# Patient Record
Sex: Female | Born: 1935
Health system: Southern US, Community
[De-identification: ages and names within clinical notes are randomized; demographics above are authoritative.]

## PROBLEM LIST (undated history)

## (undated) DIAGNOSIS — Z809 Family history of malignant neoplasm, unspecified: Secondary | ICD-10-CM

## (undated) DIAGNOSIS — Z87440 Personal history of urinary (tract) infections: Secondary | ICD-10-CM

## (undated) DIAGNOSIS — I10 Essential (primary) hypertension: Secondary | ICD-10-CM

## (undated) DIAGNOSIS — E785 Hyperlipidemia, unspecified: Secondary | ICD-10-CM

## (undated) DIAGNOSIS — M109 Gout, unspecified: Secondary | ICD-10-CM

## (undated) DIAGNOSIS — Z923 Personal history of irradiation: Secondary | ICD-10-CM

## (undated) DIAGNOSIS — Z853 Personal history of malignant neoplasm of breast: Secondary | ICD-10-CM

## (undated) DIAGNOSIS — T7840XA Allergy, unspecified, initial encounter: Secondary | ICD-10-CM

## (undated) DIAGNOSIS — I4891 Unspecified atrial fibrillation: Secondary | ICD-10-CM

## (undated) DIAGNOSIS — H9190 Unspecified hearing loss, unspecified ear: Secondary | ICD-10-CM

## (undated) DIAGNOSIS — C50919 Malignant neoplasm of unspecified site of unspecified female breast: Secondary | ICD-10-CM

## (undated) DIAGNOSIS — M8580 Other specified disorders of bone density and structure, unspecified site: Secondary | ICD-10-CM

## (undated) HISTORY — PX: CHOLECYSTECTOMY: SHX55

## (undated) HISTORY — PX: APPENDECTOMY: SHX54

## (undated) HISTORY — DX: Family history of malignant neoplasm, unspecified: Z80.9

## (undated) HISTORY — DX: Personal history of irradiation: Z92.3

## (undated) HISTORY — DX: Unspecified atrial fibrillation: I48.91

## (undated) HISTORY — PX: TONSILLECTOMY: SUR1361

## (undated) HISTORY — DX: Personal history of malignant neoplasm of breast: Z85.3

## (undated) HISTORY — PX: SEPTOPLASTY: SUR1290

## (undated) HISTORY — DX: Essential (primary) hypertension: I10

## (undated) HISTORY — DX: Hyperlipidemia, unspecified: E78.5

---

## 1989-01-02 HISTORY — PX: BREAST SURGERY: SHX581

## 1997-05-04 HISTORY — PX: ABDOMINAL HYSTERECTOMY: SHX81

## 1999-06-03 ENCOUNTER — Other Ambulatory Visit: Admission: RE | Admit: 1999-06-03 | Discharge: 1999-06-03 | Payer: Self-pay | Admitting: Internal Medicine

## 2004-03-10 ENCOUNTER — Ambulatory Visit: Payer: Self-pay | Admitting: Family Medicine

## 2004-03-13 ENCOUNTER — Ambulatory Visit: Payer: Self-pay | Admitting: Family Medicine

## 2004-03-19 ENCOUNTER — Ambulatory Visit: Payer: Self-pay | Admitting: Family Medicine

## 2004-06-19 ENCOUNTER — Ambulatory Visit: Payer: Self-pay | Admitting: Family Medicine

## 2004-07-17 ENCOUNTER — Ambulatory Visit: Payer: Self-pay | Admitting: Family Medicine

## 2004-10-10 ENCOUNTER — Ambulatory Visit: Payer: Self-pay | Admitting: Family Medicine

## 2004-10-14 ENCOUNTER — Ambulatory Visit: Payer: Self-pay | Admitting: Family Medicine

## 2004-12-15 ENCOUNTER — Ambulatory Visit: Payer: Self-pay | Admitting: Family Medicine

## 2004-12-18 ENCOUNTER — Ambulatory Visit: Payer: Self-pay | Admitting: Internal Medicine

## 2005-01-08 ENCOUNTER — Ambulatory Visit: Payer: Self-pay | Admitting: Family Medicine

## 2005-01-09 ENCOUNTER — Ambulatory Visit: Payer: Self-pay | Admitting: Internal Medicine

## 2005-01-09 ENCOUNTER — Encounter (INDEPENDENT_AMBULATORY_CARE_PROVIDER_SITE_OTHER): Payer: Self-pay | Admitting: *Deleted

## 2005-03-11 ENCOUNTER — Ambulatory Visit: Payer: Self-pay | Admitting: Family Medicine

## 2005-09-15 ENCOUNTER — Ambulatory Visit: Payer: Self-pay | Admitting: Family Medicine

## 2006-02-16 ENCOUNTER — Ambulatory Visit: Payer: Self-pay | Admitting: Family Medicine

## 2006-04-08 ENCOUNTER — Ambulatory Visit: Payer: Self-pay | Admitting: Family Medicine

## 2006-04-22 ENCOUNTER — Ambulatory Visit: Payer: Self-pay | Admitting: Family Medicine

## 2006-04-29 ENCOUNTER — Ambulatory Visit: Payer: Self-pay | Admitting: Family Medicine

## 2006-04-29 LAB — CONVERTED CEMR LAB: Potassium: 4.3 meq/L (ref 3.5–5.1)

## 2006-07-22 ENCOUNTER — Ambulatory Visit: Payer: Self-pay | Admitting: Family Medicine

## 2006-07-22 LAB — CONVERTED CEMR LAB
BUN: 21 mg/dL (ref 6–23)
CO2: 29 meq/L (ref 19–32)
Calcium: 10.2 mg/dL (ref 8.4–10.5)
Chloride: 100 meq/L (ref 96–112)
Cholesterol: 208 mg/dL (ref 0–200)
Creatinine, Ser: 1.2 mg/dL (ref 0.4–1.2)
Creatinine,U: 205.6 mg/dL
Direct LDL: 124 mg/dL
GFR calc Af Amer: 57 mL/min
GFR calc non Af Amer: 47 mL/min
Glucose, Bld: 138 mg/dL — ABNORMAL HIGH (ref 70–99)
HDL: 57 mg/dL (ref >39.0)
Hgb A1c MFr Bld: 7.1 % — ABNORMAL HIGH (ref 4.6–6.0)
Microalb Creat Ratio: 21.9 mg/g (ref 0.0–30.0)
Microalb, Ur: 4.5 mg/dL — ABNORMAL HIGH (ref 0.0–1.9)
Potassium: 3.9 meq/L (ref 3.5–5.1)
Sodium: 141 meq/L (ref 135–145)
Total CHOL/HDL Ratio: 3.6
Triglycerides: 212 mg/dL (ref 0–149)
VLDL: 42 mg/dL — ABNORMAL HIGH (ref 0–40)

## 2007-01-04 ENCOUNTER — Telehealth: Payer: Self-pay | Admitting: *Deleted

## 2007-01-19 ENCOUNTER — Ambulatory Visit: Payer: Self-pay | Admitting: Family Medicine

## 2007-01-19 LAB — CONVERTED CEMR LAB
Glucose, Bld: 149 mg/dL — ABNORMAL HIGH (ref 70–99)
Hgb A1c MFr Bld: 7 % — ABNORMAL HIGH (ref 4.6–6.0)

## 2007-05-17 DIAGNOSIS — Z87448 Personal history of other diseases of urinary system: Secondary | ICD-10-CM | POA: Insufficient documentation

## 2007-05-18 ENCOUNTER — Ambulatory Visit: Payer: Self-pay | Admitting: Internal Medicine

## 2007-05-18 ENCOUNTER — Inpatient Hospital Stay (HOSPITAL_COMMUNITY): Admission: EM | Admit: 2007-05-18 | Discharge: 2007-05-20 | Payer: Self-pay | Admitting: Emergency Medicine

## 2007-05-31 ENCOUNTER — Ambulatory Visit: Payer: Self-pay | Admitting: Family Medicine

## 2007-06-20 ENCOUNTER — Telehealth: Payer: Self-pay | Admitting: Family Medicine

## 2007-06-27 ENCOUNTER — Telehealth: Payer: Self-pay | Admitting: Family Medicine

## 2007-07-21 ENCOUNTER — Telehealth: Payer: Self-pay | Admitting: Family Medicine

## 2007-08-10 ENCOUNTER — Ambulatory Visit: Payer: Self-pay | Admitting: Family Medicine

## 2007-08-10 DIAGNOSIS — I10 Essential (primary) hypertension: Secondary | ICD-10-CM | POA: Insufficient documentation

## 2007-08-10 DIAGNOSIS — E785 Hyperlipidemia, unspecified: Secondary | ICD-10-CM | POA: Insufficient documentation

## 2007-08-10 LAB — CONVERTED CEMR LAB
BUN: 21 mg/dL (ref 6–23)
CO2: 31 meq/L (ref 19–32)
Calcium: 10.5 mg/dL (ref 8.4–10.5)
Chloride: 106 meq/L (ref 96–112)
Creatinine, Ser: 1.3 mg/dL — ABNORMAL HIGH (ref 0.4–1.2)
GFR calc Af Amer: 52 mL/min
GFR calc non Af Amer: 43 mL/min
Glucose, Bld: 136 mg/dL — ABNORMAL HIGH (ref 70–99)
Hgb A1c MFr Bld: 6.7 % — ABNORMAL HIGH (ref 4.6–6.0)
Potassium: 5.4 meq/L — ABNORMAL HIGH (ref 3.5–5.1)
Sodium: 145 meq/L (ref 135–145)

## 2007-08-19 ENCOUNTER — Telehealth: Payer: Self-pay | Admitting: Family Medicine

## 2007-10-19 ENCOUNTER — Encounter: Payer: Self-pay | Admitting: *Deleted

## 2007-12-08 ENCOUNTER — Ambulatory Visit: Payer: Self-pay | Admitting: Family Medicine

## 2007-12-08 DIAGNOSIS — M899 Disorder of bone, unspecified: Secondary | ICD-10-CM

## 2007-12-08 DIAGNOSIS — M949 Disorder of cartilage, unspecified: Secondary | ICD-10-CM

## 2008-01-03 ENCOUNTER — Ambulatory Visit: Payer: Self-pay | Admitting: Family Medicine

## 2008-01-03 DIAGNOSIS — N390 Urinary tract infection, site not specified: Secondary | ICD-10-CM | POA: Insufficient documentation

## 2008-01-03 LAB — CONVERTED CEMR LAB
Glucose, Urine, Semiquant: NEGATIVE
Ketones, urine, test strip: NEGATIVE
Nitrite: POSITIVE
Protein, U semiquant: NEGATIVE
Specific Gravity, Urine: 1.02
Urobilinogen, UA: 0.2
pH: 6.5

## 2008-01-04 ENCOUNTER — Encounter: Payer: Self-pay | Admitting: Family Medicine

## 2008-01-05 ENCOUNTER — Ambulatory Visit: Payer: Self-pay | Admitting: Family Medicine

## 2008-01-05 LAB — CONVERTED CEMR LAB
Bilirubin Urine: NEGATIVE
Glucose, Urine, Semiquant: NEGATIVE
Nitrite: NEGATIVE
Specific Gravity, Urine: 1.02
Urobilinogen, UA: 0.2
WBC Urine, dipstick: NEGATIVE
pH: 6

## 2008-01-26 ENCOUNTER — Ambulatory Visit: Payer: Self-pay | Admitting: Family Medicine

## 2008-01-26 LAB — CONVERTED CEMR LAB
Bilirubin Urine: NEGATIVE
Blood in Urine, dipstick: NEGATIVE
Glucose, Urine, Semiquant: NEGATIVE
Ketones, urine, test strip: NEGATIVE
Nitrite: NEGATIVE
Protein, U semiquant: NEGATIVE
Specific Gravity, Urine: 1.02
Urobilinogen, UA: 0.2
WBC Urine, dipstick: NEGATIVE
pH: 6

## 2008-05-31 ENCOUNTER — Ambulatory Visit: Payer: Self-pay | Admitting: Family Medicine

## 2008-06-07 ENCOUNTER — Ambulatory Visit: Payer: Self-pay | Admitting: Family Medicine

## 2008-06-14 ENCOUNTER — Encounter: Payer: Self-pay | Admitting: Family Medicine

## 2008-06-25 LAB — CONVERTED CEMR LAB
BUN: 26 mg/dL — ABNORMAL HIGH (ref 6–23)
CO2: 0 meq/L — CL (ref 19–32)
Calcium: 10.5 mg/dL (ref 8.4–10.5)
Chloride: 110 meq/L (ref 96–112)
Creatinine, Ser: 1.1 mg/dL (ref 0.4–1.2)
GFR calc Af Amer: 63 mL/min
GFR calc non Af Amer: 52 mL/min
Glucose, Bld: 131 mg/dL — ABNORMAL HIGH (ref 70–99)
Potassium: 5.5 meq/L — ABNORMAL HIGH (ref 3.5–5.1)
Sodium: 144 meq/L (ref 135–145)

## 2008-06-28 ENCOUNTER — Ambulatory Visit: Payer: Self-pay | Admitting: Family Medicine

## 2008-06-28 DIAGNOSIS — R197 Diarrhea, unspecified: Secondary | ICD-10-CM

## 2008-10-03 ENCOUNTER — Encounter: Payer: Self-pay | Admitting: Family Medicine

## 2008-10-11 ENCOUNTER — Ambulatory Visit: Payer: Self-pay | Admitting: Family Medicine

## 2008-10-11 DIAGNOSIS — J069 Acute upper respiratory infection, unspecified: Secondary | ICD-10-CM | POA: Insufficient documentation

## 2009-02-13 ENCOUNTER — Telehealth: Payer: Self-pay | Admitting: Family Medicine

## 2009-02-19 ENCOUNTER — Encounter: Payer: Self-pay | Admitting: *Deleted

## 2009-03-26 ENCOUNTER — Ambulatory Visit: Payer: Self-pay | Admitting: Family Medicine

## 2009-05-31 ENCOUNTER — Ambulatory Visit: Payer: Self-pay | Admitting: Family Medicine

## 2009-05-31 LAB — CONVERTED CEMR LAB
Bilirubin Urine: NEGATIVE
Glucose, Urine, Semiquant: NEGATIVE
Ketones, urine, test strip: NEGATIVE
Nitrite: NEGATIVE
Specific Gravity, Urine: 1.02
Urobilinogen, UA: 0.2
pH: 5.5

## 2009-06-24 ENCOUNTER — Emergency Department (HOSPITAL_COMMUNITY): Admission: EM | Admit: 2009-06-24 | Discharge: 2009-06-24 | Payer: Self-pay | Admitting: Emergency Medicine

## 2009-07-16 LAB — HM DIABETES EYE EXAM

## 2009-10-24 ENCOUNTER — Encounter: Payer: Self-pay | Admitting: Family Medicine

## 2010-05-10 ENCOUNTER — Emergency Department (HOSPITAL_BASED_OUTPATIENT_CLINIC_OR_DEPARTMENT_OTHER)
Admission: EM | Admit: 2010-05-10 | Discharge: 2010-05-10 | Payer: Self-pay | Source: Home / Self Care | Admitting: Emergency Medicine

## 2010-05-13 ENCOUNTER — Ambulatory Visit
Admission: RE | Admit: 2010-05-13 | Discharge: 2010-05-13 | Payer: Self-pay | Source: Home / Self Care | Attending: Family Medicine | Admitting: Family Medicine

## 2010-05-13 ENCOUNTER — Other Ambulatory Visit: Payer: Self-pay | Admitting: Family Medicine

## 2010-05-13 ENCOUNTER — Telehealth: Payer: Self-pay | Admitting: Family Medicine

## 2010-05-13 LAB — CBC WITH DIFFERENTIAL/PLATELET
Basophils Absolute: 0 10*3/uL (ref 0.0–0.1)
Basophils Relative: 0.5 % (ref 0.0–3.0)
Eosinophils Absolute: 0.1 10*3/uL (ref 0.0–0.7)
Eosinophils Relative: 1.5 % (ref 0.0–5.0)
HCT: 38.3 % (ref 36.0–46.0)
Hemoglobin: 12.9 g/dL (ref 12.0–15.0)
Lymphocytes Relative: 25.3 % (ref 12.0–46.0)
Lymphs Abs: 1.8 10*3/uL (ref 0.7–4.0)
MCHC: 33.6 g/dL (ref 30.0–36.0)
MCV: 96 fl (ref 78.0–100.0)
Monocytes Absolute: 0.6 10*3/uL (ref 0.1–1.0)
Monocytes Relative: 8.5 % (ref 3.0–12.0)
Neutro Abs: 4.5 10*3/uL (ref 1.4–7.7)
Neutrophils Relative %: 64.2 % (ref 43.0–77.0)
Platelets: 173 10*3/uL (ref 150.0–400.0)
RBC: 3.99 Mil/uL (ref 3.87–5.11)
RDW: 14.3 % (ref 11.5–14.6)
WBC: 7 10*3/uL (ref 4.5–10.5)

## 2010-05-13 LAB — BASIC METABOLIC PANEL
BUN: 41 mg/dL — ABNORMAL HIGH (ref 6–23)
CO2: 26 mEq/L (ref 19–32)
Calcium: 9.9 mg/dL (ref 8.4–10.5)
Chloride: 104 mEq/L (ref 96–112)
Creatinine, Ser: 1.4 mg/dL — ABNORMAL HIGH (ref 0.4–1.2)
GFR: 38.2 mL/min — ABNORMAL LOW (ref >60.00)
Glucose, Bld: 100 mg/dL — ABNORMAL HIGH (ref 70–99)
Potassium: 4.5 mEq/L (ref 3.5–5.1)
Sodium: 140 mEq/L (ref 135–145)

## 2010-05-13 LAB — CONVERTED CEMR LAB
Bilirubin Urine: NEGATIVE
Blood in Urine, dipstick: NEGATIVE
Glucose, Urine, Semiquant: NEGATIVE
Ketones, urine, test strip: NEGATIVE
Nitrite: NEGATIVE
Protein, U semiquant: 100
Specific Gravity, Urine: 1.015
Urobilinogen, UA: 0.2
pH: 6

## 2010-05-13 LAB — LIPID PANEL
Cholesterol: 151 mg/dL (ref 0–200)
HDL: 55.4 mg/dL (ref >39.00)
LDL Cholesterol: 63 mg/dL (ref 0–99)
Total CHOL/HDL Ratio: 3
Triglycerides: 163 mg/dL — ABNORMAL HIGH (ref 0.0–149.0)
VLDL: 32.6 mg/dL (ref 0.0–40.0)

## 2010-05-13 LAB — MICROALBUMIN / CREATININE URINE RATIO
Creatinine,U: 104.6 mg/dL
Microalb Creat Ratio: 4.3 mg/g (ref 0.0–30.0)
Microalb, Ur: 4.5 mg/dL — ABNORMAL HIGH (ref 0.0–1.9)

## 2010-05-13 LAB — HEPATIC FUNCTION PANEL
ALT: 32 U/L (ref 0–35)
AST: 34 U/L (ref 0–37)
Albumin: 4.1 g/dL (ref 3.5–5.2)
Alkaline Phosphatase: 60 U/L (ref 39–117)
Bilirubin, Direct: 0.1 mg/dL (ref 0.0–0.3)
Total Bilirubin: 0.6 mg/dL (ref 0.3–1.2)
Total Protein: 6.7 g/dL (ref 6.0–8.3)

## 2010-05-13 LAB — TSH: TSH: 1.69 u[IU]/mL (ref 0.35–5.50)

## 2010-05-13 LAB — HEMOGLOBIN A1C: Hgb A1c MFr Bld: 6.6 % — ABNORMAL HIGH (ref 4.6–6.5)

## 2010-05-19 LAB — URINALYSIS, ROUTINE W REFLEX MICROSCOPIC
Bilirubin Urine: NEGATIVE
Ketones, ur: 15 mg/dL — AB
Nitrite: NEGATIVE
Protein, ur: 30 mg/dL — AB
Specific Gravity, Urine: 1.013 (ref 1.005–1.030)
Urine Glucose, Fasting: NEGATIVE mg/dL
Urobilinogen, UA: 0.2 mg/dL (ref 0.0–1.0)
pH: 6 (ref 5.0–8.0)

## 2010-05-19 LAB — URINE CULTURE
Colony Count: 60000
Culture  Setup Time: 201201080027

## 2010-05-19 LAB — URINE MICROSCOPIC-ADD ON

## 2010-05-28 ENCOUNTER — Other Ambulatory Visit: Payer: Self-pay | Admitting: Family Medicine

## 2010-05-28 ENCOUNTER — Other Ambulatory Visit (HOSPITAL_COMMUNITY): Admission: RE | Admit: 2010-05-28 | Payer: Medicare Other | Source: Ambulatory Visit | Admitting: Family Medicine

## 2010-05-28 ENCOUNTER — Encounter: Payer: Self-pay | Admitting: Family Medicine

## 2010-05-28 ENCOUNTER — Other Ambulatory Visit (HOSPITAL_COMMUNITY)
Admission: RE | Admit: 2010-05-28 | Discharge: 2010-05-28 | Disposition: A | Discharge status: Home / Self Care | Payer: Medicare Other | Source: Ambulatory Visit | Attending: Family Medicine | Admitting: Family Medicine

## 2010-05-28 ENCOUNTER — Ambulatory Visit
Admission: RE | Admit: 2010-05-28 | Discharge: 2010-05-28 | Payer: Self-pay | Source: Home / Self Care | Attending: Family Medicine | Admitting: Family Medicine

## 2010-05-28 DIAGNOSIS — N189 Chronic kidney disease, unspecified: Secondary | ICD-10-CM | POA: Insufficient documentation

## 2010-05-28 DIAGNOSIS — Z87448 Personal history of other diseases of urinary system: Secondary | ICD-10-CM | POA: Insufficient documentation

## 2010-05-28 DIAGNOSIS — Z124 Encounter for screening for malignant neoplasm of cervix: Secondary | ICD-10-CM | POA: Insufficient documentation

## 2010-05-28 LAB — CONVERTED CEMR LAB
Bilirubin Urine: NEGATIVE
Blood in Urine, dipstick: NEGATIVE
Glucose, Urine, Semiquant: NEGATIVE
Ketones, urine, test strip: NEGATIVE
Nitrite: NEGATIVE
Protein, U semiquant: NEGATIVE
Specific Gravity, Urine: 1.015
Urobilinogen, UA: 0.2
WBC Urine, dipstick: NEGATIVE
pH: 6

## 2010-05-28 LAB — BASIC METABOLIC PANEL
BUN: 26 mg/dL — ABNORMAL HIGH (ref 6–23)
CO2: 30 mEq/L (ref 19–32)
Calcium: 9.8 mg/dL (ref 8.4–10.5)
Chloride: 106 mEq/L (ref 96–112)
Creatinine, Ser: 1.1 mg/dL (ref 0.4–1.2)
GFR: 50.63 mL/min — ABNORMAL LOW (ref >60.00)
Glucose, Bld: 105 mg/dL — ABNORMAL HIGH (ref 70–99)
Potassium: 4.5 mEq/L (ref 3.5–5.1)
Sodium: 144 mEq/L (ref 135–145)

## 2010-05-28 LAB — HM DIABETES FOOT EXAM

## 2010-06-01 LAB — CONVERTED CEMR LAB
ALT: 17 units/L (ref 0–35)
ALT: 20 units/L (ref 0–40)
AST: 20 units/L (ref 0–37)
AST: 21 units/L (ref 0–37)
Albumin: 4.2 g/dL (ref 3.5–5.2)
Alkaline Phosphatase: 62 units/L (ref 39–117)
Alkaline Phosphatase: 67 units/L (ref 39–117)
BUN: 28 mg/dL — ABNORMAL HIGH (ref 6–23)
BUN: 29 mg/dL — ABNORMAL HIGH (ref 6–23)
Basophils Absolute: 0 10*3/uL (ref 0.0–0.1)
Basophils Absolute: 0.1 10*3/uL (ref 0.0–0.1)
Basophils Relative: 0.6 % (ref 0.0–1.0)
Basophils Relative: 0.9 % (ref 0.0–3.0)
Bilirubin Urine: NEGATIVE
Bilirubin, Direct: 0.1 mg/dL (ref 0.0–0.3)
Blood in Urine, dipstick: NEGATIVE
CO2: 29 meq/L (ref 19–32)
Calcium: 10 mg/dL (ref 8.4–10.5)
Chloride: 106 meq/L (ref 96–112)
Chol/HDL Ratio, serum: 3.2
Cholesterol: 168 mg/dL (ref 0–200)
Cholesterol: 187 mg/dL (ref 0–200)
Creatinine, Ser: 1.2 mg/dL (ref 0.4–1.2)
Creatinine, Ser: 1.2 mg/dL (ref 0.4–1.2)
Creatinine,U: 91 mg/dL
Eosinophil percent: 1.3 % (ref 0.0–5.0)
Eosinophils Absolute: 0.1 10*3/uL (ref 0.0–0.7)
Eosinophils Relative: 1 % (ref 0.0–5.0)
GFR calc Af Amer: 57 mL/min
GFR calc non Af Amer: 47 mL/min
Glucose, Bld: 105 mg/dL — ABNORMAL HIGH (ref 70–99)
Glucose, Bld: 147 mg/dL — ABNORMAL HIGH (ref 70–99)
Glucose, Urine, Semiquant: NEGATIVE
HCT: 43.4 % (ref 36.0–46.0)
HCT: 48.1 % — ABNORMAL HIGH (ref 36.0–46.0)
HDL: 54.3 mg/dL (ref >39.0)
HDL: 58.6 mg/dL (ref >39.0)
Hemoglobin: 14.8 g/dL (ref 12.0–15.0)
Hemoglobin: 15.6 g/dL — ABNORMAL HIGH (ref 12.0–15.0)
Hgb A1c MFr Bld: 6.7 % — ABNORMAL HIGH (ref 4.6–6.0)
Hgb A1c MFr Bld: 7.3 % — ABNORMAL HIGH (ref 4.6–6.0)
LDL Cholesterol: 105 mg/dL — ABNORMAL HIGH (ref 0–99)
LDL Cholesterol: 90 mg/dL (ref 0–99)
Lymphocytes Relative: 30 % (ref 12.0–46.0)
Lymphocytes Relative: 30.6 % (ref 12.0–46.0)
MCHC: 32.4 g/dL (ref 30.0–36.0)
MCHC: 34 g/dL (ref 30.0–36.0)
MCV: 93.1 fL (ref 78.0–100.0)
MCV: 93.9 fL (ref 78.0–100.0)
Microalb, Ur: 0.2 mg/dL (ref 0.0–1.9)
Monocytes Absolute: 0.5 10*3/uL (ref 0.1–1.0)
Monocytes Absolute: 0.7 10*3/uL (ref 0.2–0.7)
Monocytes Relative: 10 % (ref 3.0–11.0)
Monocytes Relative: 8.3 % (ref 3.0–12.0)
Neutro Abs: 3.6 10*3/uL (ref 1.4–7.7)
Neutro Abs: 4 10*3/uL (ref 1.4–7.7)
Neutrophils Relative %: 58.1 % (ref 43.0–77.0)
Neutrophils Relative %: 59.2 % (ref 43.0–77.0)
Nitrite: NEGATIVE
Platelets: 194 10*3/uL (ref 150–400)
Platelets: 224 10*3/uL (ref 150–400)
Potassium: 2.9 meq/L — ABNORMAL LOW (ref 3.5–5.1)
Potassium: 3.5 meq/L (ref 3.5–5.1)
Protein, U semiquant: NEGATIVE
RBC: 4.63 M/uL (ref 3.87–5.11)
RBC: 5.16 M/uL — ABNORMAL HIGH (ref 3.87–5.11)
RDW: 12.9 % (ref 11.5–14.6)
RDW: 13.4 % (ref 11.5–14.6)
Sodium: 140 meq/L (ref 135–145)
Sodium: 143 meq/L (ref 135–145)
Specific Gravity, Urine: 1.015
TSH: 1.8 microintl units/mL (ref 0.35–5.50)
TSH: 2.38 microintl units/mL (ref 0.35–5.50)
Total Bilirubin: 1.3 mg/dL — ABNORMAL HIGH (ref 0.3–1.2)
Total CHOL/HDL Ratio: 3.1
Total Protein: 6.9 g/dL (ref 6.0–8.3)
Triglyceride fasting, serum: 115 mg/dL (ref 0–149)
Triglycerides: 121 mg/dL (ref 0–149)
Uric Acid, Serum: 4.4 mg/dL (ref 2.4–7.0)
Urobilinogen, UA: 0.2
VLDL: 23 mg/dL (ref 0–40)
VLDL: 24 mg/dL (ref 0–40)
WBC Urine, dipstick: NEGATIVE
WBC: 6.2 10*3/uL (ref 4.5–10.5)
WBC: 6.9 10*3/uL (ref 4.5–10.5)
pH: 5.5

## 2010-06-05 NOTE — Assessment & Plan Note (Signed)
Summary: fu on uti/njr   Vital Signs:  Patient profile:   76 year old female Height:      62 inches Weight:      135 pounds BMI:     24.78 Temp:     97.6 degrees F oral BP sitting:   110 / 80  (left arm) Cuff size:   regular  Vitals Entered By: Kern Reap CMA Duncan Dull) (May 13, 2010 1:59 PM) CC: follow-up visit for uti, glucose control   CC:  follow-up visit for uti and glucose control.  History of Present Illness: Adrienne [name] is a 76 year old female, who comes in today for evaluation of two problems.  Four days ago.  She went to an urgent care with symptoms of a UTI and was given Keflex.  However, she taken the medication, and it doesn't seem to help.  No fever, chills, back pain, etc., are in  Her blood sugar.  It ranges from 118 to 148.  Last A1c 6.7, August 2009 she's been noncompliant about follow-up.  I explained to her in the future.  She would need to come once a year for a physical examination and every 3 months for an A1c.    Allergies: 1)  ! Penicillin V Potassium (Penicillin V Potassium)  Past History:  Past medical, surgical, family and social histories (including risk factors) reviewed for relevance to current acute and chronic problems.  Past Medical History: Reviewed history from 01/26/2008 and no changes required. childbirth x 3 appendectomy cholecystectomy TAH/BSO, nonmalignant reasons right breast, biopsy, benign Diabetes mellitus, type II Hyperlipidemia Hypertension tonsillitis January 2009 pylo nephritis x 2 year 2009  Family History: Reviewed history from 05/31/2007 and no changes required. father died at 74, hypertensive CNS bleed mother died 36" old age" mother also had gallbladder disease, coronary disease, and hypertension no brothers or sisters  Social History: Reviewed history from 05/31/2007 and no changes required. Retired Runner, broadcasting/film/video Married Never Smoked Alcohol use-no Drug use-no Regular exercise-yes  Review of Systems  See HPI  Physical Exam  General:  Well-developed,well-nourished,in no acute distress; alert,appropriate and cooperative throughout examination   Impression & Recommendations:  Problem # 1:  UTI (ICD-599.0) Assessment Unchanged  The following medications were removed from the medication list:    Septra Ds 800-160 Mg Tabs (Sulfamethoxazole-trimethoprim) .Marland Kitchen... Take 1 tablet by mouth two times a day Her updated medication list for this problem includes:    Ciprofloxacin Hcl 500 Mg Tabs (Ciprofloxacin hcl) .Marland Kitchen... 1 po 2  times a day x 1 week, then 1 at bedtime x bottle empty  Orders: Venipuncture (04540) TLB-Lipid Panel (80061-LIPID) TLB-BMP (Basic Metabolic Panel-BMET) (80048-METABOL) TLB-CBC Platelet - w/Differential (85025-CBCD) TLB-Hepatic/Liver Function Pnl (80076-HEPATIC) TLB-TSH (Thyroid Stimulating Hormone) (84443-TSH) TLB-A1C / Hgb A1C (Glycohemoglobin) (83036-A1C) TLB-Microalbumin/Creat Ratio, Urine (82043-MALB) Urinalysis-dipstick only (Medicare patient) (98119JY) Specimen Handling (78295)  Problem # 2:  DIABETES MELLITUS, TYPE II (ICD-250.00) Assessment: Deteriorated  Her updated medication list for this problem includes:    Metformin Hcl 500 Mg Tabs (Metformin hcl) .Marland Kitchen..Marland Kitchen Two times a day    Adult Aspirin Ec Low Strength 81 Mg Tbec (Aspirin) ..... Once daily    Zestril 10 Mg Tabs (Lisinopril) .Marland Kitchen... Take 1 tablet by mouth every morning  Orders: Venipuncture (62130) TLB-Lipid Panel (80061-LIPID) TLB-BMP (Basic Metabolic Panel-BMET) (80048-METABOL) TLB-CBC Platelet - w/Differential (85025-CBCD) TLB-Hepatic/Liver Function Pnl (80076-HEPATIC) TLB-TSH (Thyroid Stimulating Hormone) (84443-TSH) TLB-A1C / Hgb A1C (Glycohemoglobin) (83036-A1C) TLB-Microalbumin/Creat Ratio, Urine (82043-MALB) Urinalysis-dipstick only (Medicare patient) (86578IO) Specimen Handling (96295)  Complete Medication List: 1)  Allopurinol  300 Mg Tabs (Allopurinol) .... Once daily 2)  Metformin Hcl  500 Mg Tabs (Metformin hcl) .... Two times a day 3)  Tenoretic 50 50-25 Mg Tabs (Atenolol-chlorthalidone) .... 1/2 by mouth  every morning 4)  Klor-con M20 20 Meq Tbcr (Potassium chloride crys cr) .... Two qam 5)  Simvastatin 40 Mg Tabs (Simvastatin) .... Once daily 6)  Preservision Areds Caps (Multiple vitamins-minerals) .... Once daily 7)  Centrum Silver Tabs (Multiple vitamins-minerals) .... Once daily 8)  Adult Aspirin Ec Low Strength 81 Mg Tbec (Aspirin) .... Once daily 9)  Premarin 0.625 Mg/gm Crea (Estrogens, conjugated) .... Apply weekly 10)  Zestril 10 Mg Tabs (Lisinopril) .... Take 1 tablet by mouth every morning 11)  Accu-chek Aviva Strp (Glucose blood) .... Test daily as directed by your doctor 12)  Hydromet 5-1.5 Mg/47ml Syrp (Hydrocodone-homatropine) .... 1/2 tsp three times a day as needed 13)  Hydromet 5-1.5 Mg/53ml Syrp (Hydrocodone-homatropine) .Marland Kitchen.. 1 or 2 tsps three times a day as needed 14)  Ciprofloxacin Hcl 500 Mg Tabs (Ciprofloxacin hcl) .Marland Kitchen.. 1 po 2  times a day x 1 week, then 1 at bedtime x bottle empty  Patient Instructions: 1)  stop the Keflex. 2)  Begin Cipro one twice daily for 7 days, then one at bedtime until the bottle is empty 3)  Continue current medications. 4)  Return in two weeks for a 30 minute appointment for general checkup Prescriptions: CIPROFLOXACIN HCL 500 MG TABS (CIPROFLOXACIN HCL) 1 po 2  times a day x 1 week, then 1 at bedtime x bottle empty  #30 x 1   Entered and Authorized by:   Roderick Pee MD   Signed by:   Roderick Pee MD on 05/13/2010   Method used:   Print then Give to Patient   RxID:   [id number]    Orders Added: 1)  Venipuncture [14782] 2)  TLB-Lipid Panel [80061-LIPID] 3)  TLB-BMP (Basic Metabolic Panel-BMET) [80048-METABOL] 4)  TLB-CBC Platelet - w/Differential [85025-CBCD] 5)  TLB-Hepatic/Liver Function Pnl [80076-HEPATIC] 6)  TLB-TSH (Thyroid Stimulating Hormone) [84443-TSH] 7)  TLB-A1C / Hgb A1C (Glycohemoglobin)  [83036-A1C] 8)  TLB-Microalbumin/Creat Ratio, Urine [82043-MALB] 9)  Est. Patient Level IV [95621] 10)  Urinalysis-dipstick only (Medicare patient) [81003QW] 11)  Specimen Handling [99000]    Laboratory Results   Urine Tests  Date/Time Received: May 13, 2010   Routine Urinalysis   Color: yellow Appearance: Clear Glucose: negative   (Normal Range: Negative) Bilirubin: negative   (Normal Range: Negative) Ketone: negative   (Normal Range: Negative) Spec. Gravity: 1.015   (Normal Range: 1.003-1.035) Blood: negative   (Normal Range: Negative) pH: 6.0   (Normal Range: 5.0-8.0) Protein: 100   (Normal Range: Negative) Urobilinogen: 0.2   (Normal Range: 0-1) Nitrite: negative   (Normal Range: Negative) Leukocyte Esterace: moderate   (Normal Range: Negative)    Comments: Kern Reap CMA Duncan Dull)  May 13, 2010 2:31 PM

## 2010-06-05 NOTE — Assessment & Plan Note (Signed)
Summary: CPX/AS PER DR/RCD   Vital Signs:  Patient profile:   76 year old female Menstrual status:  hysterectomy Height:      61.5 inches Weight:      139 pounds BMI:     25.93 Temp:     97.7 degrees F oral BP sitting:   140 / 84  (left arm) Cuff size:   regular  Vitals Entered By: Kern Reap CMA Duncan Dull) (May 28, 2010 9:31 AM) CC: wellness exam Is Patient Diabetic? No Pain Assessment Patient in pain? no          Menstrual Status hysterectomy   CC:  wellness exam.  History of Present Illness: Adrienne [name] is a 76 year old female, who comes in today for wellness exam.  She has a history of underlying hypertension, for which he takes Tenoretic 50 to 25 dose one half tab q.a.m. also Zestril 10 mg q.a.m. for renal protection because she has underlying diabetes, treated with metformin 500 mg b.i.d.  Labs prior to exam showed a drop in her GFR 38. two weeks ago advised her to stop the ACE inhibitor, which she has done.  Will check another b- met today  She also takes allopurinol 300 mg daily, potassium 40 mEq.  Daily, simvastatin 40 mg daily, 81 mg, baby aspirin daily, Premarin vaginal cream for vaginal dryness.  Tetanus 2005, seasonal flu 2011, Pneumovax 2003, shingles 2009, mammogram, June 2011, normal colonoscopy x 2.  Normal she does not require for lower colonoscopies.  Previous bone density shows some osteopenia.  She takes calcium, vitamin D, and walks on a regular basis. Here for Medicare AWV:  1.   Risk factors based on Past M, S, F history:...no changes except noted above 2.   Physical Activities: walks daily 3.   Depression/mood: good mood.  No depression 4.   Hearing: normal with bilateral hearing aids 5.   ADL's: functions independently 6.   Fall Risk: reviewed and identified none 7.   Home Safety: no guns in the house 8.   Height, weight, &visual acuity:..height weight, vision normal.  Eye exam by ophthalmologist, March 2011 no retinopathy 9.   Counseling:  .......continue good health habits 10.   Labs ordered based on risk factors: ........labs ordered to follow-up on ACE inhibitor induced renal dysfunction 11.           Referral Coordination........none indicated 12.           Care Plan...Marland KitchenMarland KitchenMarland Kitchenoriented x 3 does all her own financial work 13.            Cognitive Assessment   Allergies: 1)  ! Penicillin V Potassium (Penicillin V Potassium)  Past History:  Past medical, surgical, family and social histories (including risk factors) reviewed, and no changes noted (except as noted below).  Past Medical History: Reviewed history from 01/26/2008 and no changes required. childbirth x 3 appendectomy cholecystectomy TAH/BSO, nonmalignant reasons right breast, biopsy, benign Diabetes mellitus, type II Hyperlipidemia Hypertension tonsillitis January 2009 pylo nephritis x 2 year 2009  Family History: Reviewed history from 05/31/2007 and no changes required. father died at 14, hypertensive CNS bleed mother died 21" old age" mother also had gallbladder disease, coronary disease, and hypertension no brothers or sisters  Social History: Reviewed history from 05/31/2007 and no changes required. 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 No apparent alopecia or balding. Eyes:  No corneal or conjunctival inflammation noted. EOMI. Perrla. Funduscopic exam benign, without hemorrhages, exudates or papilledema. Vision grossly normal. Ears:  External ear exam shows no significant lesions or deformities.  Otoscopic examination reveals clear canals, tympanic membranes are intact bilaterally without bulging, retraction, inflammation or discharge. Hearing is grossly normal  bilaterally. Nose:  External nasal examination shows no deformity or inflammation. Nasal mucosa are pink and moist without lesions or exudates. Mouth:  Oral mucosa and oropharynx without lesions or exudates.  Teeth in good repair. Neck:  No deformities, masses, or tenderness noted. Chest Wall:  No deformities, masses, or tenderness noted. Breasts:  No mass, nodules, thickening, tenderness, bulging, retraction, inflamation, nipple discharge or skin changes noted.   Lungs:  Normal respiratory effort, chest expands symmetrically. Lungs are clear to auscultation, no crackles or wheezes. Heart:  Normal rate and regular rhythm. S1 and S2 normal without gallop, murmur, click, rub or other extra sounds. Abdomen:  Bowel sounds positive,abdomen soft and non-tender without masses, organomegaly or hernias noted. Rectal:  No external abnormalities noted. Normal sphincter tone. No rectal masses or tenderness. Genitalia:  Pelvic Exam:        External: normal female genitalia without lesions or masses        Vagina: normal without lesions or masses        Cervix: normal without lesions or masses        Adnexa: normal bimanual exam without masses or fullness        Uterus: normal by palpation        Pap smear: performed Msk:  No deformity or scoliosis noted of thoracic or lumbar spine.   Pulses:  R and L carotid,radial,femoral,dorsalis pedis and posterior tibial pulses are full and equal bilaterally Extremities:  No clubbing, cyanosis, edema, or deformity noted with normal full range of motion of all joints.   Neurologic:  No cranial nerve deficits noted. Station and gait are normal. Plantar reflexes are down-going bilaterally. DTRs are symmetrical throughout. Sensory, motor and coordinative functions appear intact. Skin:  Intact without suspicious lesions or rashes Cervical Nodes:  No lymphadenopathy noted Axillary Nodes:  No palpable lymphadenopathy Inguinal Nodes:  No significant adenopathy Psych:   Cognition and judgment appear intact. Alert and cooperative with normal attention span and concentration. No apparent delusions, illusions, hallucinations  Diabetes Management Exam:    Foot Exam (with socks and/or shoes not present):       Sensory-Pinprick/Light touch:          Left medial foot (L-4): normal          Left dorsal foot (L-5): normal          Left lateral foot (S-1): normal          Right medial foot (L-4): normal          Right dorsal foot (L-5): normal          Right lateral foot (S-1): normal       Sensory-Monofilament:          Left foot: normal          Right foot: normal       Inspection:          Left foot: normal          Right foot: normal       Nails:          Left foot: normal  Right foot: normal    Eye Exam:       Eye Exam done elsewhere          Date: 07/16/2009          Results: normal          Done by: bevis   Impression & Recommendations:  Problem # 1:  HYPERTENSION (ICD-401.9) Assessment Improved  Her updated medication list for this problem includes:    Tenoretic 50 50-25 Mg Tabs (Atenolol-chlorthalidone) .Marland Kitchen... 1/2 by mouth  every morning    Zestril 10 Mg Tabs (Lisinopril) .Marland Kitchen... Take 1 tablet by mouth every morning  Orders: Prescription Created Electronically [phone]) Medicare -1st Annual Wellness Visit [phone]) EKG w/ Interpretation (93000)  Problem # 2:  HYPERLIPIDEMIA (ICD-272.4) Assessment: Improved  Her updated medication list for this problem includes:    Simvastatin 40 Mg Tabs (Simvastatin) ..... Once daily  Orders: Prescription Created Electronically [id number]) Medicare -1st Annual Wellness Visit [phone]) EKG w/ Interpretation (93000)  Problem # 3:  DIABETES MELLITUS, TYPE II (ICD-250.00) Assessment: Improved  Her updated medication list for this problem includes:    Metformin Hcl 500 Mg Tabs (Metformin hcl) .Marland Kitchen..Marland Kitchen Two times a day    Adult Aspirin Ec Low Strength 81 Mg Tbec (Aspirin) ..... Once daily    Zestril 10 Mg  Tabs (Lisinopril) .Marland Kitchen... Take 1 tablet by mouth every morning  Orders: Prescription Created Electronically [phone]) Medicare -1st Annual Wellness Visit [phone])  Problem # 4:  RENAL INSUFFICIENCY, ACUTE (ICD-585.9) Assessment: New  Orders: Venipuncture (36644) Prescription Created Electronically [id number]) Medicare -1st Annual Wellness Visit [phone]) Specimen Handling (38756) TLB-BMP (Basic Metabolic Panel-BMET) (80048-METABOL)  Problem # 5:  Preventive Health Care (ICD-V70.0) Assessment: Unchanged  Complete Medication List: 1)  Allopurinol 300 Mg Tabs (Allopurinol) .... Once daily 2)  Metformin Hcl 500 Mg Tabs (Metformin hcl) .... Two times a day 3)  Tenoretic 50 50-25 Mg Tabs (Atenolol-chlorthalidone) .... 1/2 by mouth  every morning 4)  Klor-con M20 20 Meq Tbcr (Potassium chloride crys cr) .... Two qam 5)  Simvastatin 40 Mg Tabs (Simvastatin) .... Once daily 6)  Preservision Areds Caps (Multiple vitamins-minerals) .... Once daily 7)  Centrum Silver Tabs (Multiple vitamins-minerals) .... Once daily 8)  Adult Aspirin Ec Low Strength 81 Mg Tbec (Aspirin) .... Once daily 9)  Premarin 0.625 Mg/gm Crea (Estrogens, conjugated) .... Apply weekly 10)  Zestril 10 Mg Tabs (Lisinopril) .... Take 1 tablet by mouth every morning 11)  Accu-chek Aviva Strp (Glucose blood) .... Test daily as directed by your doctor 12)  Hydromet 5-1.5 Mg/24ml Syrp (Hydrocodone-homatropine) .... 1/2 tsp three times a day as needed 13)  Hydromet 5-1.5 Mg/3ml Syrp (Hydrocodone-homatropine) .Marland Kitchen.. 1 or 2 tsps three times a day as needed 14)  Ciprofloxacin Hcl 500 Mg Tabs (Ciprofloxacin hcl) .Marland Kitchen.. 1 po 2  times a day x 1 week, then 1 at bedtime x bottle empty  Other Orders: Urinalysis-dipstick only (Medicare patient) (43329JJ) Pneumococcal Vaccine (88416) Admin 1st Vaccine (60630)  Patient Instructions: 1)  continue your current medications.  I will call you when I get your lab work back. 2)  BMP prior to visit,  ICD-9: 3)  HbgA1C prior to visit, ICD-9: 4)  Please schedule a follow-up appointment in 3 months.Marland Kitchen..250.00 5)  Check your blood sugars regularly. If your readings are usually above : or below 70 you should contact our office. 6)  It is important that your Diabetic A1c level is checked every 3 months. 7)  See your eye doctor yearly  to check for diabetic eye damage. 8)  Check your feet each night for sore areas, calluses or signs of infection. 9)  Check your Blood Pressure regularly. If it is above: you should make an appointment. Prescriptions: ACCU-CHEK AVIVA  STRP (GLUCOSE BLOOD) test daily as directed by your doctor  #50 Not Speci x 9   Entered and Authorized by:   Roderick Pee MD   Signed by:   Roderick Pee MD on 05/28/2010   Method used:   Electronically to        [facility] [id number]* (retail)       [address]       [place], [place]  [address]       Ph: [phone]       Fax: [phone]   RxID:   [id number] PREMARIN 0.625 MG/GM  CREA (ESTROGENS, CONJUGATED) apply weekly  #3 tubes x 3   Entered and Authorized by:   Roderick Pee MD   Signed by:   Roderick Pee MD on 05/28/2010   Method used:   Electronically to        [facility] # [id number]* (retail)       [address]       [place], [place]  [address]       Ph: [phone]       Fax: [phone]   RxID:   [id number] SIMVASTATIN 40 MG  TABS (SIMVASTATIN) once daily  #100 x 3   Entered and Authorized by:   Roderick Pee MD   Signed by:   Roderick Pee MD on 05/28/2010   Method used:   Electronically to        [facility] [id number]* (retail)       [address]       [place], [place]  [address]       Ph: [phone]       Fax: [phone]   RxID:   [id number] KLOR-CON M20 20 MEQ  TBCR (POTASSIUM CHLORIDE CRYS CR) two qam  #180 Tablet x 3   Entered and Authorized by:   Roderick Pee MD   Signed by:   Roderick Pee MD on 05/28/2010   Method used:    Electronically to        [facility] # [id number]* (retail)       [address]       [place], [place]  [address]       Ph: [phone]       Fax: [phone]   RxID:   [id number] TENORETIC 50 50-25 MG  TABS (ATENOLOL-CHLORTHALIDONE) 1/2 by mouth  every morning  #50 Tablet x 3   Entered and Authorized by:   Roderick Pee MD   Signed by:   Roderick Pee MD on 05/28/2010   Method used:   Electronically to        [facility] # [id number]* (retail)       [address]       [place], [place]  [address]       Ph: [phone]       Fax: [phone]   RxID:   [id number] METFORMIN HCL 500 MG  TABS (METFORMIN HCL) two times a day  #60 Tablet x 3   Entered and Authorized by:   Roderick Pee MD   Signed by:   Roderick Pee MD on 05/28/2010   Method used:  Electronically to        [facility] [address]* (retail)       [address]       [place], [place]  [address]       Ph: [phone]       Fax: [phone]   RxID:   [id number] ALLOPURINOL 300 MG TABS (ALLOPURINOL) once daily  #100 Tablet x 3   Entered and Authorized by:   Roderick Pee MD   Signed by:   Roderick Pee MD on 05/28/2010   Method used:   Electronically to        [facility] # [phone]* (retail)       [address]       [place], [place]  [address]       Ph: [phone]       Fax: [phone]   RxID:   [id number]    Orders Added: 1)  Venipuncture [16010] 2)  Prescription Created Electronically [phone] 3)  Medicare -1st Annual Wellness Visit [G0438] 4)  Specimen Handling [99000] 5)  EKG w/ Interpretation [93000] 6)  Urinalysis-dipstick only (Medicare patient) [81003QW] 7)  Pneumococcal Vaccine [90732] 8)  Admin 1st Vaccine [90471] 9)  TLB-BMP (Basic Metabolic Panel-BMET) [80048-METABOL]   Immunization History:  Influenza Immunization History:    Influenza:  historical (02/01/2010)  Immunizations Administered:  Pneumonia  Vaccine:    Vaccine Type: Pneumovax    Site: left deltoid    Mfr: Merck    Dose: 0.5 ml    Route: IM    Given by: Kern Reap CMA (AAMA)    Exp. Date: 09/03/2011    Lot #: [id number]    Physician counseled: yes   Immunization History:  Influenza Immunization History:    Influenza:  Historical (02/01/2010)  Immunizations Administered:  Pneumonia Vaccine:    Vaccine Type: Pneumovax    Site: left deltoid    Mfr: Merck    Dose: 0.5 ml    Route: IM    Given by: Kern Reap CMA (AAMA)    Exp. Date: 09/03/2011    Lot #: [id number]    Physician counseled: yes     Laboratory Results   Urine Tests  Date/Time Received: May 28, 2010   Routine Urinalysis   Color: yellow Appearance: Clear Glucose: negative   (Normal Range: Negative) Bilirubin: negative   (Normal Range: Negative) Ketone: negative   (Normal Range: Negative) Spec. Gravity: 1.015   (Normal Range: 1.003-1.035) Blood: negative   (Normal Range: Negative) pH: 6.0   (Normal Range: 5.0-8.0) Protein: negative   (Normal Range: Negative) Urobilinogen: 0.2   (Normal Range: 0-1) Nitrite: negative   (Normal Range: Negative) Leukocyte Esterace: negative   (Normal Range: Negative)    Comments: Kern Reap CMA (AAMA)  May 28, 2010 12:30 PM

## 2010-06-05 NOTE — Miscellaneous (Signed)
Summary: mammogram update   Clinical Lists Changes  Observations: Added new observation of MAMMOGRAM: normal (10/18/2009 10:26)      Preventive Care Screening  Mammogram:    Date:  10/18/2009    Results:  normal

## 2010-06-05 NOTE — Assessment & Plan Note (Signed)
Summary: ?uti/ua/pressure/cjr   Vital Signs:  Patient profile:   76 year old female Weight:      132 pounds Temp:     97.7 degrees F BP sitting:   110 / 62  Vitals Entered By: Lynann Beaver CMA (May 31, 2009 11:22 AM) CC: uti Is Patient Diabetic? Yes   CC:  uti.  History of Present Illness: Adrienne [name] is a 76 year old female, married, nonsmoker, who comes in today with a 4-day history of frequency and pressure when she urinates.  She's also developed nocturia x 3.  No fever, chills, or back pain.  No dysuria.  Her last UTI was two years ago.  He got out of control, and she had to be hospitalized on IV antibiotics.  Current Medications (verified): 1)  Allopurinol 300 Mg Tabs (Allopurinol) .... Once Daily 2)  Metformin Hcl 500 Mg  Tabs (Metformin Hcl) .... Two Times A Day 3)  Tenoretic 50 50-25 Mg  Tabs (Atenolol-Chlorthalidone) .... 1/2 By Mouth  Every Morning 4)  Klor-Con M20 20 Meq  Tbcr (Potassium Chloride Crys Cr) .... Two Qam 5)  Simvastatin 40 Mg  Tabs (Simvastatin) .... Once Daily 6)  Preservision Areds   Caps (Multiple Vitamins-Minerals) .... Once Daily 7)  Centrum Silver   Tabs (Multiple Vitamins-Minerals) .... Once Daily 8)  Adult Aspirin Ec Low Strength 81 Mg  Tbec (Aspirin) .... Once Daily 9)  Premarin 0.625 Mg/gm  Crea (Estrogens, Conjugated) .... Apply Weekly 10)  Zestril 10 Mg Tabs (Lisinopril) .... Take 1 Tablet By Mouth Every Morning 11)  Accu-Chek Aviva  Strp (Glucose Blood) .... Test Daily As Directed By Your Doctor 12)  Hydromet 5-1.5 Mg/65ml Syrp (Hydrocodone-Homatropine) .... 1/2 Tsp Three Times A Day As Needed 13)  Hydromet 5-1.5 Mg/68ml Syrp (Hydrocodone-Homatropine) .Marland Kitchen.. 1 or 2 Tsps Three Times A Day As Needed  Allergies (verified): 1)  ! Penicillin V Potassium (Penicillin V Potassium)  Past History:  Past medical, surgical, family and social histories (including risk factors) reviewed for relevance to current acute and chronic problems.  Past Medical  History: Reviewed history from 01/26/2008 and no changes required. childbirth x 3 appendectomy cholecystectomy TAH/BSO, nonmalignant reasons right breast, biopsy, benign Diabetes mellitus, type II Hyperlipidemia Hypertension tonsillitis January 2009 pylo nephritis x 2 year 2009  Family History: Reviewed history from 05/31/2007 and no changes required. father died at 44, hypertensive CNS bleed mother died 55" old age" mother also had gallbladder disease, coronary disease, and hypertension no brothers or sisters  Social History: Reviewed history from 05/31/2007 and no changes required. Retired Runner, broadcasting/film/video Married Never Smoked Alcohol use-no Drug use-no Regular exercise-yes  Review of Systems      See HPI  Physical Exam  General:  Well-developed,well-nourished,in no acute distress; alert,appropriate and cooperative throughout examination Abdomen:  Bowel sounds positive,abdomen soft and non-tender without masses, organomegaly or hernias noted.   Impression & Recommendations:  Problem # 1:  UTI (ICD-599.0) Assessment Deteriorated  Her updated medication list for this problem includes:    Septra Ds 800-160 Mg Tabs (Sulfamethoxazole-trimethoprim) .Marland Kitchen... Take 1 tablet by mouth two times a day  Orders: Prescription Created Electronically [phone])  Complete Medication List: 1)  Allopurinol 300 Mg Tabs (Allopurinol) .... Once daily 2)  Metformin Hcl 500 Mg Tabs (Metformin hcl) .... Two times a day 3)  Tenoretic 50 50-25 Mg Tabs (Atenolol-chlorthalidone) .... 1/2 by mouth  every morning 4)  Klor-con M20 20 Meq Tbcr (Potassium chloride crys cr) .... Two qam 5)  Simvastatin 40 Mg Tabs (Simvastatin) .Marland KitchenMarland KitchenMarland Kitchen  Once daily 6)  Preservision Areds Caps (Multiple vitamins-minerals) .... Once daily 7)  Centrum Silver Tabs (Multiple vitamins-minerals) .... Once daily 8)  Adult Aspirin Ec Low Strength 81 Mg Tbec (Aspirin) .... Once daily 9)  Premarin 0.625 Mg/gm Crea (Estrogens, conjugated) ....  Apply weekly 10)  Zestril 10 Mg Tabs (Lisinopril) .... Take 1 tablet by mouth every morning 11)  Accu-chek Aviva Strp (Glucose blood) .... Test daily as directed by your doctor 12)  Hydromet 5-1.5 Mg/81ml Syrp (Hydrocodone-homatropine) .... 1/2 tsp three times a day as needed 13)  Hydromet 5-1.5 Mg/66ml Syrp (Hydrocodone-homatropine) .Marland Kitchen.. 1 or 2 tsps three times a day as needed 14)  Septra Ds 800-160 Mg Tabs (Sulfamethoxazole-trimethoprim) .... Take 1 tablet by mouth two times a day  Patient Instructions: 1)  drank 20 ounces of water daily, 4 ounces of cranberry juice twice a day, begin Septra, one twice a day, x 10 days.  Return p.r.n. Prescriptions: SEPTRA DS 800-160 MG TABS (SULFAMETHOXAZOLE-TRIMETHOPRIM) Take 1 tablet by mouth two times a day  #20 x 1   Entered and Authorized by:   Roderick Pee MD   Signed by:   Roderick Pee MD on 05/31/2009   Method used:   Electronically to        [facility] # [id number]* (retail)       [address] [place], [place]  [address]       Ph: [phone]       Fax: [phone]   RxID:   [id number]   Laboratory Results   Urine Tests  Date/Time Received: May 31, 2009   Routine Urinalysis   Color: yellow Appearance: Clear Glucose: negative   (Normal Range: Negative) Bilirubin: negative   (Normal Range: Negative) Ketone: negative   (Normal Range: Negative) Spec. Gravity: 1.020   (Normal Range: 1.003-1.035) Blood: trace-lysed   (Normal Range: Negative) pH: 5.5   (Normal Range: 5.0-8.0) Protein: trace   (Normal Range: Negative) Urobilinogen: 0.2   (Normal Range: 0-1) Nitrite: negative   (Normal Range: Negative) Leukocyte Esterace: small   (Normal Range: Negative)    Comments: Rita Ohara  May 31, 2009 11:28 AM

## 2010-06-05 NOTE — Progress Notes (Signed)
  Phone Note Outgoing Call   Summary of Call: I called Ms. Vessel to tell her her at GFR has dropped from 47 to 37 stop the lisinopril.  We will recheck her GFR when she comes in for her physical Initial call taken by: Roderick Pee MD,  May 13, 2010 5:38 PM

## 2010-07-03 LAB — HM DIABETES EYE EXAM

## 2010-07-23 LAB — GLUCOSE, CAPILLARY: Glucose-Capillary: 157 mg/dL — ABNORMAL HIGH (ref 70–99)

## 2010-07-26 ENCOUNTER — Other Ambulatory Visit: Payer: Self-pay | Admitting: Family Medicine

## 2010-08-20 ENCOUNTER — Other Ambulatory Visit (INDEPENDENT_AMBULATORY_CARE_PROVIDER_SITE_OTHER): Payer: Medicare Other | Admitting: Family Medicine

## 2010-08-20 LAB — BASIC METABOLIC PANEL
BUN: 28 mg/dL — ABNORMAL HIGH (ref 6–23)
CO2: 29 mEq/L (ref 19–32)
Calcium: 10.2 mg/dL (ref 8.4–10.5)
Chloride: 103 mEq/L (ref 96–112)
Creatinine, Ser: 1.2 mg/dL (ref 0.4–1.2)
GFR: 46.28 mL/min — ABNORMAL LOW (ref >60.00)
Glucose, Bld: 124 mg/dL — ABNORMAL HIGH (ref 70–99)
Potassium: 4.6 mEq/L (ref 3.5–5.1)
Sodium: 142 mEq/L (ref 135–145)

## 2010-08-20 LAB — HEMOGLOBIN A1C: Hgb A1c MFr Bld: 6.6 % — ABNORMAL HIGH (ref 4.6–6.5)

## 2010-08-26 ENCOUNTER — Encounter: Payer: Self-pay | Admitting: Family Medicine

## 2010-08-27 ENCOUNTER — Encounter: Payer: Self-pay | Admitting: Family Medicine

## 2010-08-27 ENCOUNTER — Ambulatory Visit (INDEPENDENT_AMBULATORY_CARE_PROVIDER_SITE_OTHER): Payer: Medicare Other | Admitting: Family Medicine

## 2010-08-27 VITALS — BP 140/80 | Temp 98.1°F | Ht 61.5 in | Wt 129.0 lb

## 2010-08-27 DIAGNOSIS — E119 Type 2 diabetes mellitus without complications: Secondary | ICD-10-CM

## 2010-08-27 NOTE — Patient Instructions (Signed)
Continue your current medications.  Remember to walk daily.  Follow-up in January 2013 3.  Her annual exam.,,,,,,,,,,,,,,,,,,,  Fasting labs one week prior

## 2010-08-27 NOTE — Progress Notes (Signed)
  Subjective:    Patient ID: Adrienne [name], female    DOB: May 20, 1934, 76 y.o.   MRN: [id number]  HPIeleanor  Is a 76 year old female type II diabetic who takes metformin 500 b.i.d. And comes in today for follow-up of her diabetes.  We did her complete physical examination in January at that time.  Her blood sugar was 105 with an A1c of 6.7.  She's been compliant with her medications.  She follows her diet and walks on a regular basis.  Blood sugar now 124, A1c still good at 6.6%.  No hypoglycemia    Review of Systems In general, and metabolic review of systems otherwise negative    Objective:   Physical Exam Well-developed well-nourished, female, in no acute distress       Assessment & Plan:  Diabetes type 2, under good control........... Continue treatment program.  Follow-up January 2013 for annual exam

## 2010-09-16 NOTE — Discharge Summary (Signed)
NAME:  Adrienne [name], Adrienne [name] NO.:  [id number]   MEDICAL RECORD NO.:  [id number]          PATIENT TYPE:  INP   LOCATION:  1503                         FACILITY:  [facility]   PHYSICIAN:  Raenette Rover. Felicity Coyer, MDDATE OF BIRTH:  June 04, 1934   DATE OF ADMISSION:  05/17/2007  DATE OF DISCHARGE:  05/20/2007                               DISCHARGE SUMMARY   DISCHARGE DIAGNOSES:  1. Escherichia coli urinary tract infection.  2. Acute renal insufficiency.  3. Nausea, vomiting and diarrhea.  4. Type 2 diabetes.   HISTORY OF PRESENT ILLNESS:  This is a 76 year old white female with a  history of diabetes who presented to the ED on January 13 with a 1 day  history of nausea, vomiting and diarrhea.  She reported feeling fine on  waking up that morning.  After going to the [facility] to work out she  experienced sudden onset of diarrhea and vomiting shortly after  returning home.  The patient estimated approximately 6 episodes of  vomiting and diarrhea prior to arrival in the ER and it was the morning  of admission with headache and dizziness.  That same morning the husband  reported an unsteady gait, became worried and transported his wife to  the ER.  The patient denied any recent sick contacts.  She think she may  have had a fever while at home, but did not check her temperature.  She  denied any recent blurred vision, weakness, slurred speech, chest pain,  shortness of breath, abdominal pain or dysuria.  The patient had not  eaten any uncooked or strange food per her report.  The patient was  admitted to the hospital for further evaluation.   PAST MEDICAL HISTORY:  1. Type 2 diabetes.  2. Hyperlipidemia.  3. Gout.  4. Hypertension.  5. Osteoporosis.  6. History of appendectomy.  7. History of hysterectomy.  8. History of cholecystectomy.   COURSE OF HOSPITALIZATION:  1. E. coli UTI.  The patient was placed on IV Rocephin and completed a      total of 3 days treatment in the  hospital.  She will be discharged      on p.o. Ceftin for a total of 10 days of treatment.  The patient's      white cell count had decreased from 15.9 to 9.3 on admission.  A CT      scan of the abdomen and pelvis was done on her second day of      hospitalization due to increased white cell count and renal      function.  This test revealed asymmetry in the kidney size with the      left kidney slightly larger, mild left perinephritic and left      proximal periurethral stranding consistent with pyelo or ureteral      inflammation/infection.  No bowel obstruction or diverticulitis was      noted on CT scan.  2. Acute renal insufficiency secondary to dehydration.  The patient      was placed on IV normal saline.  Renal function has improved and it  is 1.2 on discharge.  3. Nausea, vomiting and diarrhea.  Symptoms had resolved on the day      prior to discharge.  Symptoms likely secondary to gastroenteritis.  4. Type 2 diabetes.  A1c was obtained on this admission of 6.9.  The      patient was covered with sliding scale insulin during admission for      increased CBG.  Upon discussion with the patient and review of CBG      during admission the patient's metformin will be increased to      b.i.d. upon discharge home.   MEDICATIONS AT TIME OF DISCHARGE:  1. Tenoretic 50 mg tabs take 1/2 tab p.o. daily.  2. Allopurinol 300 mg tab p.o. daily.  3. Zocor 40 mg p.o. daily.  4. Metformin 500 mg p.o. b.i.d.  Note this is an increased dose.  5. Fosamax 70 mg p.o. weekly.  6. Potassium chloride 20 mEq two tabs daily.  7. Ceftin 500 mg p.o. b.i.d. for a total of 10 days treatment.   LABS AT TIME OF DISCHARGE:  Urine culture positive for E. coli.  White  blood cell count 9.3, hemoglobin 12.2, hematocrit 35.6.  BMET revealed  sodium of 143, potassium of 3.7, creatinine of 1.2.  The patient's  hemoglobin A1c was 6.9%.   DISPOSITION:  The patient is to be discharged home.  The patient lives   with her husband.  She is scheduled for follow up with Willow Ora, MD on  May 31, 2007 __________  aware of this appointment.  She is unable  to follow up next week because she will be out of town __________  appointment for the week of the twenty-fifth.  The patient is asked to  call the office or return to the ER for abdominal pain, recurrent  nausea, vomiting and diarrhea or any fever.      Valerie A. Felicity Coyer, MD  Electronically Signed     VAL/MEDQ  D:  05/20/2007  T:  05/20/2007  Job:  [id number]

## 2010-10-29 ENCOUNTER — Other Ambulatory Visit: Payer: Self-pay | Admitting: Family Medicine

## 2011-01-22 LAB — COMPREHENSIVE METABOLIC PANEL
AST: 18
Albumin: 3.5
Alkaline Phosphatase: 63
BUN: 22
CO2: 23
Chloride: 103
Creatinine, Ser: 1.68 — ABNORMAL HIGH
GFR calc non Af Amer: 30 — ABNORMAL LOW
Potassium: 3.4 — ABNORMAL LOW
Total Bilirubin: 1.2

## 2011-01-22 LAB — CBC
HCT: 34.3 — ABNORMAL LOW
HCT: 42.3
Hemoglobin: 12.2
MCHC: 34.1
MCV: 91.5
Platelets: 97 — ABNORMAL LOW
RBC: 4.62
RBC: 3.81 — ABNORMAL LOW
RBC: 3.88
RDW: 13.5
RDW: 13.8
WBC: 15.9 — ABNORMAL HIGH
WBC: 17.9 — ABNORMAL HIGH

## 2011-01-22 LAB — DIFFERENTIAL
Basophils Absolute: 0
Eosinophils Absolute: 0
Lymphs Abs: 0.6 — ABNORMAL LOW
Monocytes Relative: 5
Neutro Abs: 14.5 — ABNORMAL HIGH

## 2011-01-22 LAB — URINALYSIS, ROUTINE W REFLEX MICROSCOPIC
Bilirubin Urine: NEGATIVE
Ketones, ur: NEGATIVE
Nitrite: NEGATIVE
Protein, ur: 30 — AB
Specific Gravity, Urine: 1.012
Urobilinogen, UA: 1

## 2011-01-22 LAB — BASIC METABOLIC PANEL
BUN: 27 — ABNORMAL HIGH
CO2: 19
CO2: 21
Calcium: 7.9 — ABNORMAL LOW
Calcium: 7.9 — ABNORMAL LOW
Calcium: 8.5
Creatinine, Ser: 1.72 — ABNORMAL HIGH
Creatinine, Ser: 1.38 — ABNORMAL HIGH
GFR calc Af Amer: 46 — ABNORMAL LOW
GFR calc Af Amer: 54 — ABNORMAL LOW
GFR calc non Af Amer: 29 — ABNORMAL LOW
GFR calc non Af Amer: 38 — ABNORMAL LOW
GFR calc non Af Amer: 44 — ABNORMAL LOW
Glucose, Bld: 141 — ABNORMAL HIGH
Potassium: 3.5
Potassium: 3.7
Sodium: 143

## 2011-01-22 LAB — URINE CULTURE
Colony Count: >=100000
Special Requests: NEGATIVE

## 2011-01-22 LAB — HEMOGLOBIN A1C: Hgb A1c MFr Bld: 6.9 — ABNORMAL HIGH

## 2011-02-12 ENCOUNTER — Other Ambulatory Visit: Payer: Self-pay | Admitting: Family Medicine

## 2011-04-04 ENCOUNTER — Other Ambulatory Visit: Payer: Self-pay | Admitting: Family Medicine

## 2011-05-06 ENCOUNTER — Other Ambulatory Visit: Payer: Self-pay | Admitting: Family Medicine

## 2011-05-25 ENCOUNTER — Other Ambulatory Visit (INDEPENDENT_AMBULATORY_CARE_PROVIDER_SITE_OTHER): Payer: Medicare Other

## 2011-05-25 DIAGNOSIS — E119 Type 2 diabetes mellitus without complications: Secondary | ICD-10-CM

## 2011-05-25 LAB — LIPID PANEL
Cholesterol: 161 mg/dL (ref 0–200)
VLDL: 22.2 mg/dL (ref 0.0–40.0)

## 2011-05-25 LAB — BASIC METABOLIC PANEL
CO2: 28 mEq/L (ref 19–32)
Chloride: 102 mEq/L (ref 96–112)
Glucose, Bld: 118 mg/dL — ABNORMAL HIGH (ref 70–99)
Potassium: 4.3 mEq/L (ref 3.5–5.1)
Sodium: 141 mEq/L (ref 135–145)

## 2011-05-25 LAB — POCT URINALYSIS DIPSTICK
Ketones, UA: NEGATIVE
Protein, UA: NEGATIVE
Spec Grav, UA: 1.015

## 2011-05-25 LAB — CBC WITH DIFFERENTIAL/PLATELET
Basophils Absolute: 0.1 10*3/uL (ref 0.0–0.1)
Eosinophils Absolute: 0.1 10*3/uL (ref 0.0–0.7)
HCT: 41.5 % (ref 36.0–46.0)
Hemoglobin: 13.8 g/dL (ref 12.0–15.0)
Lymphs Abs: 1.7 10*3/uL (ref 0.7–4.0)
MCHC: 33.2 g/dL (ref 30.0–36.0)
MCV: 95.8 fl (ref 78.0–100.0)
Monocytes Relative: 6.7 % (ref 3.0–12.0)
Neutro Abs: 6.1 10*3/uL (ref 1.4–7.7)
RDW: 14.4 % (ref 11.5–14.6)

## 2011-05-25 LAB — MICROALBUMIN / CREATININE URINE RATIO
Creatinine,U: 89.8 mg/dL
Microalb Creat Ratio: 0.3 mg/g (ref 0.0–30.0)
Microalb, Ur: 0.3 mg/dL (ref 0.0–1.9)

## 2011-05-25 LAB — HEPATIC FUNCTION PANEL
AST: 24 U/L (ref 0–37)
Albumin: 4.2 g/dL (ref 3.5–5.2)

## 2011-05-25 LAB — TSH: TSH: 2.53 u[IU]/mL (ref 0.35–5.50)

## 2011-06-01 ENCOUNTER — Ambulatory Visit (INDEPENDENT_AMBULATORY_CARE_PROVIDER_SITE_OTHER): Payer: Medicare Other | Admitting: Family Medicine

## 2011-06-01 ENCOUNTER — Encounter: Payer: Self-pay | Admitting: Family Medicine

## 2011-06-01 DIAGNOSIS — H6120 Impacted cerumen, unspecified ear: Secondary | ICD-10-CM

## 2011-06-01 DIAGNOSIS — I10 Essential (primary) hypertension: Secondary | ICD-10-CM

## 2011-06-01 DIAGNOSIS — E119 Type 2 diabetes mellitus without complications: Secondary | ICD-10-CM

## 2011-06-01 DIAGNOSIS — E785 Hyperlipidemia, unspecified: Secondary | ICD-10-CM

## 2011-06-01 DIAGNOSIS — M109 Gout, unspecified: Secondary | ICD-10-CM | POA: Insufficient documentation

## 2011-06-01 DIAGNOSIS — Z87448 Personal history of other diseases of urinary system: Secondary | ICD-10-CM

## 2011-06-01 DIAGNOSIS — N952 Postmenopausal atrophic vaginitis: Secondary | ICD-10-CM

## 2011-06-01 DIAGNOSIS — Z0000 Encounter for general adult medical examination without abnormal findings: Secondary | ICD-10-CM

## 2011-06-01 MED ORDER — POTASSIUM CHLORIDE CRYS ER 20 MEQ PO TBCR: 20.0000 meq | EXTENDED_RELEASE_TABLET | Freq: Two times a day (BID) | ORAL | Status: DC

## 2011-06-01 MED ORDER — ATENOLOL-CHLORTHALIDONE 50-25 MG PO TABS: 0.5000 | ORAL_TABLET | Freq: Every day | ORAL | Status: DC

## 2011-06-01 MED ORDER — ALLOPURINOL 300 MG PO TABS: 300.0000 mg | ORAL_TABLET | Freq: Every day | ORAL | Status: DC

## 2011-06-01 MED ORDER — METFORMIN HCL 500 MG PO TABS: 500.0000 mg | ORAL_TABLET | Freq: Two times a day (BID) | ORAL | Status: DC

## 2011-06-01 MED ORDER — SIMVASTATIN 40 MG PO TABS: 40.0000 mg | ORAL_TABLET | Freq: Every day | ORAL | Status: DC

## 2011-06-01 MED ORDER — ESTROGENS, CONJUGATED 0.625 MG/GM VA CREA: TOPICAL_CREAM | Freq: Every day | VAGINAL | Status: DC

## 2011-06-01 NOTE — Progress Notes (Signed)
  Subjective:    Patient ID: Adrienne [name], female    DOB: 1934-11-01, 77 y.o.   MRN: [id number]  HPIEleanor is a 77 year old female, married, nonsmoker, who comes in today for Medicare wellness examination because of a history of hyperuricemia, hypertension, hyperlipidemia, diabetes, type II,  Current medications reviewed in detail, and there have been no changes.  Her problem list is the same.  She states she feels well and has no problems.  Her blood pressure at home is 125/80 today here at 178/80.  She does routine eye care, hearing normal, regular dental care, colonoscopy, normal in GI, seasonal flu shot 2012, Pneumovax, x 2, tetanus, 2005, shingles 2009.  Cognitive function, normal.  She walks on a regular basis.  Home health safety reviewed.  No issues identified, no guns in the house, and she does have a healthcare power of attorney and living will    Review of Systems  Constitutional: Negative.   HENT: Negative.   Eyes: Negative.   Respiratory: Negative.   Cardiovascular: Negative.   Gastrointestinal: Negative.   Genitourinary: Negative.   Musculoskeletal: Negative.   Neurological: Negative.   Hematological: Negative.   Psychiatric/Behavioral: Negative.        Objective:   Physical Exam  Constitutional: She appears well-developed and well-nourished.  HENT:  Head: Normocephalic and atraumatic.  Right Ear: External ear normal.  Left Ear: External ear normal.  Nose: Nose normal.  Mouth/Throat: Oropharynx is clear and moist.  Eyes: EOM are normal. Pupils are equal, round, and reactive to light.  Neck: Normal range of motion. Neck supple. No thyromegaly present.  Cardiovascular: Normal rate, regular rhythm, normal heart sounds and intact distal pulses.  Exam reveals no gallop and no friction rub.   No murmur heard. Pulmonary/Chest: Effort normal and breath sounds normal.  Abdominal: Soft. Bowel sounds are normal. She exhibits no distension and no mass. There is no  tenderness. There is no rebound.  Genitourinary:       Bilateral breast exam normal  Musculoskeletal: Normal range of motion.  Lymphadenopathy:    She has no cervical adenopathy.  Neurological: She is alert. She has normal reflexes. No cranial nerve deficit. She exhibits normal muscle tone. Coordination normal.  Skin: Skin is warm and dry.  Psychiatric: She has a normal mood and affect. Her behavior is normal. Judgment and thought content normal.          Assessment & Plan:  Healthy female.  History of hyperuricemia continue allopurinol.  Diabetes type II with A1c at 6.5.  Continue current therapy.  Hypertension.  Question goal.  Plan BP checked daily at home.  Return in two weeks for follow-up.  Hyperlipidemia.  Continue Zocor 40 nightly and an aspirin tablet.  Postmenopausal vaginal dryness.  She use the Premarin vaginal cream twice weekly

## 2011-06-01 NOTE — Patient Instructions (Signed)
Continue your current medications.  Check your blood pressure daily in the morning and return in two weeks with the data and the device

## 2011-06-16 ENCOUNTER — Ambulatory Visit (INDEPENDENT_AMBULATORY_CARE_PROVIDER_SITE_OTHER): Payer: Medicare Other | Admitting: Family Medicine

## 2011-06-16 ENCOUNTER — Encounter: Payer: Self-pay | Admitting: Family Medicine

## 2011-06-16 VITALS — BP 140/80 | Temp 97.8°F | Wt 135.0 lb

## 2011-06-16 DIAGNOSIS — J069 Acute upper respiratory infection, unspecified: Secondary | ICD-10-CM

## 2011-06-16 DIAGNOSIS — I10 Essential (primary) hypertension: Secondary | ICD-10-CM

## 2011-06-16 MED ORDER — HYDROCODONE-HOMATROPINE 5-1.5 MG/5ML PO SYRP: ORAL_SOLUTION | ORAL | Status: DC

## 2011-06-16 NOTE — Patient Instructions (Signed)
Continue your current blood pressure medication  Check your blood pressure weekly at home in  the morning  Stop the over-the-counter medication for your cold.   Tylenol,,,,,,,,,, lots of liquids,,,,,,,,,,, Hydromet 1/2-1 teaspoon at bedtime as needed

## 2011-06-16 NOTE — Progress Notes (Signed)
  Subjective:    Patient ID: Adrienne [name], female    DOB: [birth year], 77 y.o.   MRN: [id number]  HPI Adrienne [name] is a 77 year old female nonsmoker who comes in today for evaluation of hypertension and a cold for 5 days  Her blood pressure medication is Tenoretic 50-25 dose one half tab every morning BP today 140/80  For the past 5 days she's had head congestion sore throat mild cough no fever no sputum production. She's been taking over-the-counter cough medication. Advised to stop the over-the-counter medication   Review of Systems    general cardiovascular pulmonary review of systems otherwise negative Objective:   Physical Exam  Well-developed well-nourished female in no acute distress HEENT negative neck was supple no adenopathy lungs are clear  BP right arm sitting position 140/80      Assessment & Plan:  Hypertension at goal continue current therapy  Viral syndrome stop the OTC medication Hydromet each bedtime when necessary for cough and cold

## 2011-07-12 ENCOUNTER — Emergency Department (INDEPENDENT_AMBULATORY_CARE_PROVIDER_SITE_OTHER)
Admission: EM | Admit: 2011-07-12 | Discharge: 2011-07-12 | Disposition: A | Discharge status: Nursing Home (Includes ICF) | Payer: Medicare Other | Source: Home / Self Care | Attending: Emergency Medicine | Admitting: Emergency Medicine

## 2011-07-12 ENCOUNTER — Emergency Department (HOSPITAL_COMMUNITY)
Admission: EM | Admit: 2011-07-12 | Discharge: 2011-07-13 | Disposition: A | Discharge status: Home / Self Care | Payer: Medicare Other | Attending: Emergency Medicine | Admitting: Emergency Medicine

## 2011-07-12 ENCOUNTER — Encounter (HOSPITAL_COMMUNITY): Payer: Self-pay | Admitting: *Deleted

## 2011-07-12 ENCOUNTER — Emergency Department (HOSPITAL_COMMUNITY): Payer: Medicare Other

## 2011-07-12 DIAGNOSIS — Y92009 Unspecified place in unspecified non-institutional (private) residence as the place of occurrence of the external cause: Secondary | ICD-10-CM | POA: Insufficient documentation

## 2011-07-12 DIAGNOSIS — S0990XA Unspecified injury of head, initial encounter: Secondary | ICD-10-CM

## 2011-07-12 DIAGNOSIS — Z9889 Other specified postprocedural states: Secondary | ICD-10-CM | POA: Insufficient documentation

## 2011-07-12 DIAGNOSIS — K5289 Other specified noninfective gastroenteritis and colitis: Secondary | ICD-10-CM

## 2011-07-12 DIAGNOSIS — R197 Diarrhea, unspecified: Secondary | ICD-10-CM | POA: Insufficient documentation

## 2011-07-12 DIAGNOSIS — R252 Cramp and spasm: Secondary | ICD-10-CM | POA: Insufficient documentation

## 2011-07-12 DIAGNOSIS — R110 Nausea: Secondary | ICD-10-CM | POA: Insufficient documentation

## 2011-07-12 DIAGNOSIS — E119 Type 2 diabetes mellitus without complications: Secondary | ICD-10-CM | POA: Insufficient documentation

## 2011-07-12 DIAGNOSIS — W19XXXA Unspecified fall, initial encounter: Secondary | ICD-10-CM

## 2011-07-12 DIAGNOSIS — W1809XA Striking against other object with subsequent fall, initial encounter: Secondary | ICD-10-CM | POA: Insufficient documentation

## 2011-07-12 DIAGNOSIS — E785 Hyperlipidemia, unspecified: Secondary | ICD-10-CM | POA: Insufficient documentation

## 2011-07-12 DIAGNOSIS — K529 Noninfective gastroenteritis and colitis, unspecified: Secondary | ICD-10-CM

## 2011-07-12 DIAGNOSIS — I10 Essential (primary) hypertension: Secondary | ICD-10-CM | POA: Insufficient documentation

## 2011-07-12 LAB — URINALYSIS, ROUTINE W REFLEX MICROSCOPIC
Glucose, UA: NEGATIVE mg/dL
Hgb urine dipstick: NEGATIVE
Ketones, ur: 15 mg/dL — AB
Protein, ur: NEGATIVE mg/dL
pH: 5.5 (ref 5.0–8.0)

## 2011-07-12 LAB — COMPREHENSIVE METABOLIC PANEL
AST: 22 U/L (ref 0–37)
BUN: 27 mg/dL — ABNORMAL HIGH (ref 6–23)
CO2: 22 mEq/L (ref 19–32)
Calcium: 8.6 mg/dL (ref 8.4–10.5)
Chloride: 105 mEq/L (ref 96–112)
Creatinine, Ser: 1.1 mg/dL (ref 0.50–1.10)
GFR calc Af Amer: 56 mL/min — ABNORMAL LOW (ref >90)
GFR calc non Af Amer: 48 mL/min — ABNORMAL LOW (ref >90)
Glucose, Bld: 116 mg/dL — ABNORMAL HIGH (ref 70–99)
Total Bilirubin: 0.5 mg/dL (ref 0.3–1.2)

## 2011-07-12 LAB — URINE MICROSCOPIC-ADD ON

## 2011-07-12 LAB — CBC
HCT: 42 % (ref 36.0–46.0)
Hemoglobin: 14.6 g/dL (ref 12.0–15.0)
MCH: 31.5 pg (ref 26.0–34.0)
MCHC: 34.8 g/dL (ref 30.0–36.0)
MCV: 90.7 fL (ref 78.0–100.0)
RBC: 4.63 MIL/uL (ref 3.87–5.11)

## 2011-07-12 LAB — POCT I-STAT, CHEM 8
BUN: 27 mg/dL — ABNORMAL HIGH (ref 6–23)
Creatinine, Ser: 1.2 mg/dL — ABNORMAL HIGH (ref 0.50–1.10)
Glucose, Bld: 176 mg/dL — ABNORMAL HIGH (ref 70–99)
Hemoglobin: 16.7 g/dL — ABNORMAL HIGH (ref 12.0–15.0)
Potassium: 3.7 mEq/L (ref 3.5–5.1)
Sodium: 139 mEq/L (ref 135–145)

## 2011-07-12 MED ORDER — SODIUM CHLORIDE 0.9 % IV BOLUS (SEPSIS): 1000.0000 mL | Freq: Once | INTRAVENOUS | Status: DC

## 2011-07-12 MED ORDER — SODIUM CHLORIDE 0.9 % IV BOLUS (SEPSIS)
1000.0000 mL | Freq: Once | INTRAVENOUS | Status: AC
  Administered 2011-07-12: 1000 mL via INTRAVENOUS

## 2011-07-12 MED ORDER — SODIUM CHLORIDE 0.9 % IV BOLUS (SEPSIS)
500.0000 mL | Freq: Once | INTRAVENOUS | Status: AC
  Administered 2011-07-12: 500 mL via INTRAVENOUS

## 2011-07-12 NOTE — ED Notes (Signed)
SEEN AT [facility]RGENT [facility]ARE - BLOOD SPECIMEN COLLECTED.

## 2011-07-12 NOTE — ED Provider Notes (Signed)
History     CSN: [id number]  Arrival date & time 07/12/11  1813   First MD Initiated Contact with Patient 07/12/11 1820      Chief Complaint  Patient presents with  . Diarrhea    (Consider location/radiation/quality/duration/timing/severity/associated sxs/prior treatment) HPI Comments: Patient presents with multiple episodes of watery, nonbloody diarrhea starting last night. Had 1 episode of nonbloody emesis. No abdominal pain, abdominal distention, urinary complaints, there decreased urine output. Patient reports severe bilateral lower extremity cramps after the diarrhea. Took some mustard, and potassium pill, with some relief, but the patient still reports lower extremity soreness. Patient has been able to take small amounts of Gatorade, ginger ale, and is able to keep crackers down. No raw/undercooked foods, recent travel, recent antibiotic use, change in medications, known sick contacts.  Patient also reports getting her leg caught in between the toilet and the wall, falling, and "cracking" the back of her head against the tub sometime early this morning. Reports achy pain at the posterior aspect of her head. No consciousness or posttraumatic amnesia.  No nausea, vomiting, other headaches, visual changes, focal weakness, neck pain. Denies any other injury. No chest pain, palpitations, presyncope, syncope causing her fall. Patient is on aspirin 81 mg, but no other antiplatelets or anticoagulants.   ROS as noted in HPI. All other ROS negative.   Patient is a 77 y.o. female presenting with diarrhea and fall. The history is provided by the patient. No language interpreter was used.  Diarrhea The primary symptoms include diarrhea. Primary symptoms do not include fever, nausea or vomiting. The illness began today. The onset was sudden.  Fall The accident occurred 12 to 24 hours ago. She fell from an unknown height. Impact surface: tiled tub. There was no blood loss. The point of impact was the  head. The pain is present in the head. She was ambulatory at the scene. There was no entrapment after the fall. There was no drug use involved in the accident. There was no alcohol use involved in the accident. Associated symptoms include headaches. Pertinent negatives include no visual change, no fever, no numbness, no nausea, no vomiting, no hematuria, no hearing loss and no loss of consciousness.    Past Medical History  Diagnosis Date  . Hypertension   . Hyperlipidemia   . Diabetes mellitus     Past Surgical History  Procedure Date  . Appendectomy   . Cholecystectomy   . Total abdominal hysterectomy w/ bilateral salpingoophorectomy   . Breast surgery     Family History  Problem Relation Age of Onset  . Hypertension Mother   . Coronary artery disease Mother   . Hypertension Father     History  Substance Use Topics  . Smoking status: Never Smoker   . Smokeless tobacco: Not on file  . Alcohol Use: No    OB History    Grav Para Term Preterm Abortions TAB SAB Ect Mult Living                  Review of Systems  Constitutional: Negative for fever.  Gastrointestinal: Positive for diarrhea. Negative for nausea and vomiting.  Genitourinary: Negative for hematuria.  Neurological: Positive for headaches. Negative for loss of consciousness and numbness.    Allergies  Penicillins  Home Medications   Current Outpatient Rx  Name Route Sig Dispense Refill  . ALLOPURINOL 300 MG PO TABS Oral Take 1 tablet (300 mg total) by mouth daily. 100 tablet 3  . ASPIRIN 81  MG PO TABS Oral Take 81 mg by mouth daily.      . ATENOLOL-CHLORTHALIDONE 50-25 MG PO TABS Oral Take 0.5 tablets by mouth daily. 1/2 tab po every morning 50 tablet 3  . CALCIUM CITRATE-VITAMIN D 250-100 MG-UNIT PO TABS Oral Take 1 tablet by mouth 2 (two) times daily.    Marland Kitchen ESTROGENS, CONJUGATED 0.625 MG/GM VA CREA Vaginal Place vaginally daily. 42.5 g 11  . OMEGA-3 FATTY ACIDS 1000 MG PO CAPS Oral Take 1 g by mouth  daily.    Marland Kitchen GLUCOSAMINE-CHONDROITIN 500-400 MG PO TABS Oral Take 1 tablet by mouth daily.      Marland Kitchen GLUCOSE BLOOD VI STRP Other 1 each by Other route as needed. Use as instructed     . HYDROCODONE-HOMATROPINE 5-1.5 MG/5ML PO SYRP  One half teaspoon at bedtime when necessary for cough and cold 120 mL 1  . METFORMIN HCL 500 MG PO TABS Oral Take 1 tablet (500 mg total) by mouth 2 (two) times daily with a meal. 200 tablet 3  . CENTRUM SILVER PO TABS Oral Take 1 tablet by mouth daily.      Marland Kitchen EYE VITAMINS PO CAPS Oral Take by mouth.    Marland Kitchen POTASSIUM CHLORIDE CRYS ER 20 MEQ PO TBCR Oral Take 1 tablet (20 mEq total) by mouth 2 (two) times daily. 200 tablet 3  . SIMVASTATIN 40 MG PO TABS Oral Take 1 tablet (40 mg total) by mouth at bedtime. 100 tablet 3    BP 124/58  Pulse 92  Temp(Src) 98.3 F (36.8 C) (Oral)  Resp 20  SpO2 97%  Physical Exam  Nursing note and vitals reviewed. Constitutional: She is oriented to person, place, and time. She appears well-developed and well-nourished. No distress.  HENT:  Head: Normocephalic and atraumatic.    Nose: Nose normal. No sinus tenderness or nasal deformity. No epistaxis.  Mouth/Throat: Uvula is midline and mucous membranes are normal.       Mucous membranes slightly dry  Eyes: Conjunctivae and EOM are normal. Pupils are equal, round, and reactive to light.  Neck: Normal range of motion. No spinous process tenderness present.  Cardiovascular: Normal rate, regular rhythm and normal heart sounds.   Pulmonary/Chest: Effort normal and breath sounds normal.  Abdominal: Soft. Bowel sounds are normal. She exhibits no distension. There is no tenderness. There is no rebound, no guarding and no CVA tenderness.       Cholecystectomy, vertical C-section scars  Musculoskeletal: Normal range of motion.  Neurological: She is alert and oriented to person, place, and time.  Skin: Skin is warm and dry.  Psychiatric: She has a normal mood and affect. Her behavior is  normal. Judgment and thought content normal.    ED Course  Procedures (including critical care time)  Labs Reviewed  POCT I-STAT, CHEM 8 - Abnormal; Notable for the following:    BUN 27 (*)    Creatinine, Ser 1.20 (*)    Glucose, Bld 176 (*)    Hemoglobin 16.7 (*)    HCT 49.0 (*)    All other components within normal limits   No results found.   1. Gastroenteritis   2. Closed head injury      Results for orders placed during the hospital encounter of 07/12/11  POCT I-STAT, CHEM 8      Component Value Range   Sodium 139  135 - 145 (mEq/L)   Potassium 3.7  3.5 - 5.1 (mEq/L)   Chloride 105  96 - 112 (  mEq/L)   BUN 27 (*) 6 - 23 (mg/dL)   Creatinine, Ser 1.91 (*) 0.50 - 1.10 (mg/dL)   Glucose, Bld 478 (*) 70 - 99 (mg/dL)   Calcium, Ion 2.95  6.21 - 1.32 (mmol/L)   TCO2 23  0 - 100 (mmol/L)   Hemoglobin 16.7 (*) 12.0 - 15.0 (g/dL)   HCT 30.8 (*) 65.7 - 46.0 (%)    MDM   Neurologically intact, has bruise, tenderness at the top aspect of her skull. No cervical tenderness. Patient is elderly, transferring to the ED for head CT.  Domenick Gong, MD 07/12/11 2027

## 2011-07-12 NOTE — ED Notes (Addendum)
PT. ALSO ADDED THAT SHE FELL TODAY AT HOME  IN THE TUB , DENIES LOC. TRANSFERRED FROM URGENT CARE FOR FURTHER EVALUATION .

## 2011-07-12 NOTE — ED Provider Notes (Signed)
History     CSN: [id number]  Arrival date & time 07/12/11  2007   First MD Initiated Contact with Patient 07/12/11 2120      Chief Complaint  Patient presents with  . Fall    DIARRHEA / LEG CRAMPS  . Diarrhea    (Consider location/radiation/quality/duration/timing/severity/associated sxs/prior treatment) HPI  Patient presents to the emergency department from the [facility] urgent care with complaints of diarrhea, nausea, leg cramps, fall. The patient states that at 12 AM this morning she woke up having diarrhea she's had multiple episodes throughout the day. Around lunchtime today she is having an episode of diarrhea which is finished she stood up and lost her balance and her leg hit the toilet initiated up falling backwards hitting her head on the bathtub. The patient is unsure of whether the leg cramping started before or after the fall. However, she states that the leg cramping and some of the worst pains she has had. She states that she had her cramping in both legs and it lasted "a good amount of time" she drank some Gatorade, ginger ale and 8 some crackers and she states that the pain went away as well as the cramps. The patient denies chest pain, loss of consciousness, shortness of breath, dizziness. She is currently still having mild leg cramping but has not had any vomiting or diarrhea since 3:00. Pt in NAD.  Past Medical History  Diagnosis Date  . Hypertension   . Hyperlipidemia   . Diabetes mellitus     Past Surgical History  Procedure Date  . Appendectomy   . Cholecystectomy   . Total abdominal hysterectomy w/ bilateral salpingoophorectomy   . Breast surgery     Family History  Problem Relation Age of Onset  . Hypertension Mother   . Coronary artery disease Mother   . Hypertension Father     History  Substance Use Topics  . Smoking status: Never Smoker   . Smokeless tobacco: Not on file  . Alcohol Use: No    OB History    Grav Para Term Preterm Abortions  TAB SAB Ect Mult Living                  Review of Systems  All other systems reviewed and are negative.    Allergies  Penicillins  Home Medications   Current Outpatient Rx  Name Route Sig Dispense Refill  . ALLOPURINOL 300 MG PO TABS Oral Take 1 tablet (300 mg total) by mouth daily. 100 tablet 3  . CALCIUM CITRATE-VITAMIN D 250-100 MG-UNIT PO TABS Oral Take 1 tablet by mouth 2 (two) times daily.    Marland Kitchen ESTROGENS, CONJUGATED 0.625 MG/GM VA CREA Vaginal Place vaginally daily. 42.5 g 11  . OMEGA-3 FATTY ACIDS 1000 MG PO CAPS Oral Take 1 g by mouth daily.    Marland Kitchen GLUCOSE BLOOD VI STRP Other 1 each by Other route as needed. Use as instructed     . HYDROCODONE-HOMATROPINE 5-1.5 MG/5ML PO SYRP  One half teaspoon at bedtime when necessary for cough and cold 120 mL 1  . METFORMIN HCL 500 MG PO TABS Oral Take 1 tablet (500 mg total) by mouth 2 (two) times daily with a meal. 200 tablet 3  . CENTRUM SILVER PO TABS Oral Take 1 tablet by mouth daily.      Marland Kitchen EYE VITAMINS PO CAPS Oral Take by mouth.    Marland Kitchen SIMVASTATIN 40 MG PO TABS Oral Take 1 tablet (40 mg total) by  mouth at bedtime. 100 tablet 3    BP 121/57  Pulse 92  Temp(Src) 98.2 F (36.8 C) (Oral)  Resp 16  SpO2 97%  Physical Exam  Nursing note and vitals reviewed. Constitutional: She appears well-developed and well-nourished.  HENT:  Head: Normocephalic.         No laceration  Eyes: Conjunctivae are normal. Pupils are equal, round, and reactive to light.  Neck: Trachea normal, normal range of motion and full passive range of motion without pain. Neck supple.  Cardiovascular: Normal rate, regular rhythm and normal pulses.   Pulmonary/Chest: Effort normal and breath sounds normal. Chest wall is not dull to percussion. She exhibits no tenderness, no crepitus, no edema, no deformity and no retraction.  Abdominal: Soft. Normal appearance and bowel sounds are normal.  Musculoskeletal: Normal range of motion.  Neurological: She is alert.  She has normal strength.  Skin: Skin is warm, dry and intact.  Psychiatric: She has a normal mood and affect. Her speech is normal and behavior is normal. Judgment and thought content normal. Cognition and memory are normal.    ED Course  Procedures (including critical care time)  Labs Reviewed  URINALYSIS, ROUTINE W REFLEX MICROSCOPIC - Abnormal; Notable for the following:    APPearance CLOUDY (*)    Bilirubin Urine SMALL (*)    Ketones, ur 15 (*)    Leukocytes, UA SMALL (*)    All other components within normal limits  COMPREHENSIVE METABOLIC PANEL - Abnormal; Notable for the following:    Glucose, Bld 116 (*)    BUN 27 (*)    GFR calc non Af Amer 48 (*)    GFR calc Af Amer 56 (*)    All other components within normal limits  URINE MICROSCOPIC-ADD ON - Abnormal; Notable for the following:    Squamous Epithelial / LPF MANY (*)    Bacteria, UA FEW (*)    Casts HYALINE CASTS (*)    All other components within normal limits  CBC   Ct Head Wo Contrast  07/12/2011  *RADIOLOGY REPORT*  Clinical Data: Status post fall, trauma to the back of head.  CT HEAD WITHOUT CONTRAST  Technique:  Contiguous axial images were obtained from the base of the skull through the vertex without contrast.  Comparison: 06/24/2009  Findings: Prominence of the sulci, cisterns, and ventricles, in keeping with age appropriate cerebral volume loss. There is no evidence for acute hemorrhage, hydrocephalus, mass lesion, or abnormal extra-axial fluid collection.  No definite CT evidence for acute infarction. Mild partial ethmoid air cell opacification.  The visualized paranasal sinuses and mastoid air cells are otherwise predominately clear.  IMPRESSION: No definite acute intracranial abnormality.  Original Report Authenticated By: Waneta Martins, M.D.     1. Fall   2. Diarrhea       MDM  Pt work up has come back negative. Pt was able to ambulate to the bathroom with no adverse events. Pt given 2L of fluids  in ED and is feeling better. Dr. Hyman Hopes and I have low suspicion for infectious diarrhea. I have discussed the patient with my supervising attending who has spoken directly with patient and agrees with my work-up and plan.        Dorthula Matas, PA 07/13/11 [phone]

## 2011-07-12 NOTE — ED Notes (Signed)
Pt stated she fall and hit the back of her head on the tile of the bathroom, stated some bruised, and also stated having diarrhea from 0300 this morning, also having bi lat leg cramps now. Went to urgent care and was sent to ER.

## 2011-07-12 NOTE — ED Notes (Signed)
CO DIARRHEA AND LEG CRAMPS STARTING AT 0300, HAS VOMITED X 1, HAS KEPT DOWN GATORADE, GINGERALE, AND CRACKERS.

## 2011-07-12 NOTE — ED Notes (Signed)
PT. REPORTS PERSISTENT DIARRHEA WITH NAUSEA AND LEG CRAMPS ONSET THIS MORNING , DENIES FEVER , SLIGHT CHILLS. NO ABDOMINAL PAIN .

## 2011-07-12 NOTE — ED Notes (Signed)
REPORT CALLED TO BOBBY IN ER, PT TO BE TRANSPORTED VIA SHUTTLE WITH HUSBAND

## 2011-07-13 ENCOUNTER — Telehealth: Payer: Self-pay | Admitting: Family Medicine

## 2011-07-13 NOTE — ED Notes (Signed)
Patient ambulated without assistance.

## 2011-07-13 NOTE — Telephone Encounter (Signed)
[address] [place], [place] [address] p. [phone] f. [phone] To: [facility] Fax: [phone] From: [facility] Date/ Time: 07/12/2011 6:36 PM Taken By: Elease Giles, CSR Caller: Adrienne Facility: not collected Patient: [name], Adrienne DOB: 09/15/1960 Phone: [phone] Reason for Call: See info below Regarding Appointment: Yes Appt Date: 07/13/2011 Appt Time: 4:15:00 PM Provider: Burchette, Bruce Reason: Details: Card is not good Outcome: Cancelled appointment in [facility] ([facility]) 

## 2011-07-13 NOTE — Discharge Instructions (Signed)
B.R.A.T. Diet  Your doctor has recommended the B.R.A.T. diet for you or your child until the condition improves. This is often used to help control diarrhea and vomiting symptoms. If you or your child can tolerate clear liquids, you may have:   Bananas.    Rice.    Applesauce.    Toast (and other simple starches such as crackers, potatoes, noodles).   Be sure to avoid dairy products, meats, and fatty foods until symptoms are better. Fruit juices such as apple, grape, and prune juice can make diarrhea worse. Avoid these. Continue this diet for 2 days or as instructed by your caregiver.  Document Released: 04/20/2005 Document Revised: 04/09/2011 Document Reviewed: 10/07/2006  ExitCare Patient Information 2012 [employer].    Diarrhea  Infections caused by germs (bacterial) or a virus commonly cause diarrhea. Your caregiver has determined that with time, rest and fluids, the diarrhea should improve. In general, eat normally while drinking more water than usual. Although water may prevent dehydration, it does not contain salt and minerals (electrolytes). Broths, weak tea without caffeine and oral rehydration solutions (ORS) replace fluids and electrolytes.  Small amounts of fluids should be taken frequently. Large amounts at one time may not be tolerated. Plain water may be harmful in infants and the elderly. Oral rehydrating solutions (ORS) are available at pharmacies and grocery stores. ORS replace water and important electrolytes in proper proportions. Sports drinks are not as effective as ORS and may be harmful due to sugars worsening diarrhea.   ORS is especially recommended for use in children with diarrhea. As a general guideline for children, replace any new fluid losses from diarrhea and/or vomiting with ORS as follows:    If your child weighs 22 pounds or under (10 kg or less), give 60-120 mL ( -  cup or 2 - 4 ounces) of ORS for each episode of diarrheal stool or vomiting episode.    If your child  weighs more than 22 pounds (more than 10 kgs), give 120-240 mL ( - 1 cup or 4 - 8 ounces) of ORS for each diarrheal stool or episode of vomiting.    While correcting for dehydration, children should eat normally. However, foods high in sugar should be avoided because this may worsen diarrhea. Large amounts of carbonated soft drinks, juice, gelatin desserts and other highly sugared drinks should be avoided.    After correction of dehydration, other liquids that are appealing to the child may be added. Children should drink small amounts of fluids frequently and fluids should be increased as tolerated. Children should drink enough fluids to keep urine clear or pale yellow.    Adults should eat normally while drinking more fluids than usual. Drink small amounts of fluids frequently and increase as tolerated. Drink enough fluids to keep urine clear or pale yellow. Broths, weak decaffeinated tea, lemon lime soft drinks (allowed to go flat) and ORS replace fluids and electrolytes.    Avoid:    Carbonated drinks.    Juice.    Extremely hot or cold fluids.    Caffeine drinks.    Fatty, greasy foods.    Alcohol.    Tobacco.    Too much intake of anything at one time.    Gelatin desserts.    Probiotics are active cultures of beneficial bacteria. They may lessen the amount and number of diarrheal stools in adults. Probiotics can be found in yogurt with active cultures and in supplements.    Wash hands well to avoid   spreading bacteria and virus.    Anti-diarrheal medications are not recommended for infants and children.    Only take over-the-counter or prescription medicines for pain, discomfort or fever as directed by your caregiver. Do not give aspirin to children because it may cause Reye's Syndrome.    For adults, ask your caregiver if you should continue all prescribed and over-the-counter medicines.    If your caregiver has given you a follow-up appointment, it is very important to keep that  appointment. Not keeping the appointment could result in a chronic or permanent injury, and disability. If there is any problem keeping the appointment, you must call back to this facility for assistance.   SEEK IMMEDIATE MEDICAL CARE IF:     You or your child is unable to keep fluids down or other symptoms or problems become worse in spite of treatment.    Vomiting or diarrhea develops and becomes persistent.    There is vomiting of blood or bile (green material).    There is blood in the stool or the stools are black and tarry.    There is no urine output in 6-8 hours or there is only a small amount of very dark urine.    Abdominal pain develops, increases or localizes.    You have a fever.    Your baby is older than 3 months with a rectal temperature of 102 F (38.9 C) or higher.    Your baby is 3 months old or younger with a rectal temperature of 100.4 F (38 C) or higher.    You or your child develops excessive weakness, dizziness, fainting or extreme thirst.    You or your child develops a rash, stiff neck, severe headache or become irritable or sleepy and difficult to awaken.   MAKE SURE YOU:     Understand these instructions.    Will watch your condition.    Will get help right away if you are not doing well or get worse.   Document Released: 04/10/2002 Document Revised: 04/09/2011 Document Reviewed: 02/25/2009  [employer] Patient Information 2012 [employer].

## 2011-07-13 NOTE — ED Provider Notes (Signed)
Medical screening examination/treatment/procedure(s) were conducted as a shared visit with non-physician practitioner(s) and myself.  I personally evaluated the patient during the encounter  Fall with minor BHT. Weakness, diarrhea. No recent abx, no fever/c/blood in stools. Feeling better after IVF and ambulatory without complaints. CT head negative. Electrolytes wnl. Stable for discharge home with her husband. PMD f/u  Forbes Cellar, MD 07/13/11 1453

## 2011-08-04 ENCOUNTER — Encounter: Payer: Self-pay | Admitting: Family Medicine

## 2011-08-04 ENCOUNTER — Ambulatory Visit (INDEPENDENT_AMBULATORY_CARE_PROVIDER_SITE_OTHER): Payer: Medicare Other | Admitting: Family Medicine

## 2011-08-04 VITALS — BP 120/78 | Temp 98.0°F | Wt 128.0 lb

## 2011-08-04 DIAGNOSIS — E119 Type 2 diabetes mellitus without complications: Secondary | ICD-10-CM

## 2011-08-04 DIAGNOSIS — J111 Influenza due to unidentified influenza virus with other respiratory manifestations: Secondary | ICD-10-CM | POA: Insufficient documentation

## 2011-08-04 DIAGNOSIS — J069 Acute upper respiratory infection, unspecified: Secondary | ICD-10-CM

## 2011-08-04 MED ORDER — HYDROCODONE-HOMATROPINE 5-1.5 MG/5ML PO SYRP: ORAL_SOLUTION | ORAL | Status: DC

## 2011-08-04 NOTE — Progress Notes (Signed)
  Subjective:    Patient ID: Adrienne [name], female    DOB: Mar 03, 1935, 77 y.o.   MRN: [id number]  HPI Adrienne [name] is a 77 year old married female nonsmoker who comes in with a three-day history of fever chills a canal over and headache. No sore throat cough nausea vomiting diarrhea or urinary tract symptoms.  She did have the flu shot last fall  Blood sugar today 107 on metformin 500 mg twice a day  Has been is well no contact with anybody who's been sick with influenza   Review of Systems    general review of systems otherwise negative Objective:   Physical Exam  Well-developed well-nourished female in no acute distress HEENT negative neck was supple no adenopathy lungs are clear cardiac exam normal abdominal exam normal no skin rash      Assessment & Plan:  Influenza plan rest at home lots of liquids Tylenol or aspirin for fever and chills Hydromet. For headache and flu symptoms return when necessary

## 2011-08-04 NOTE — Patient Instructions (Signed)
Drink lots of liquids  Tylenol or aspirin for fever and chills  Hydromet 1/2-1 teaspoon 3 times daily  Monitor your blood sugar to be sure it doesn't get too high  Continue to take your metformin one twice daily  Hold the Zocor until the fever is gone and

## 2011-08-06 ENCOUNTER — Telehealth: Payer: Self-pay | Admitting: Family Medicine

## 2011-08-06 ENCOUNTER — Telehealth: Payer: Self-pay

## 2011-08-06 ENCOUNTER — Inpatient Hospital Stay (HOSPITAL_COMMUNITY)
Admission: EM | Admit: 2011-08-06 | Discharge: 2011-08-10 | DRG: 690 | Disposition: A | Discharge status: Home / Self Care | Payer: Medicare Other | Attending: Internal Medicine | Admitting: Internal Medicine

## 2011-08-06 ENCOUNTER — Encounter (HOSPITAL_COMMUNITY): Payer: Self-pay | Admitting: Emergency Medicine

## 2011-08-06 DIAGNOSIS — E119 Type 2 diabetes mellitus without complications: Secondary | ICD-10-CM | POA: Diagnosis present

## 2011-08-06 DIAGNOSIS — E860 Dehydration: Secondary | ICD-10-CM

## 2011-08-06 DIAGNOSIS — M899 Disorder of bone, unspecified: Secondary | ICD-10-CM | POA: Diagnosis present

## 2011-08-06 DIAGNOSIS — J069 Acute upper respiratory infection, unspecified: Secondary | ICD-10-CM

## 2011-08-06 DIAGNOSIS — N390 Urinary tract infection, site not specified: Principal | ICD-10-CM | POA: Diagnosis present

## 2011-08-06 DIAGNOSIS — Z87448 Personal history of other diseases of urinary system: Secondary | ICD-10-CM

## 2011-08-06 DIAGNOSIS — N289 Disorder of kidney and ureter, unspecified: Secondary | ICD-10-CM | POA: Diagnosis present

## 2011-08-06 DIAGNOSIS — R112 Nausea with vomiting, unspecified: Secondary | ICD-10-CM | POA: Diagnosis present

## 2011-08-06 DIAGNOSIS — N189 Chronic kidney disease, unspecified: Secondary | ICD-10-CM

## 2011-08-06 DIAGNOSIS — E785 Hyperlipidemia, unspecified: Secondary | ICD-10-CM | POA: Diagnosis present

## 2011-08-06 DIAGNOSIS — R197 Diarrhea, unspecified: Secondary | ICD-10-CM | POA: Diagnosis present

## 2011-08-06 DIAGNOSIS — I10 Essential (primary) hypertension: Secondary | ICD-10-CM | POA: Diagnosis present

## 2011-08-06 DIAGNOSIS — J111 Influenza due to unidentified influenza virus with other respiratory manifestations: Secondary | ICD-10-CM | POA: Diagnosis present

## 2011-08-06 DIAGNOSIS — N952 Postmenopausal atrophic vaginitis: Secondary | ICD-10-CM

## 2011-08-06 LAB — URINALYSIS, ROUTINE W REFLEX MICROSCOPIC
Glucose, UA: NEGATIVE mg/dL
Ketones, ur: 15 mg/dL — AB
Specific Gravity, Urine: 1.023 (ref 1.005–1.030)
pH: 5.5 (ref 5.0–8.0)

## 2011-08-06 LAB — CBC
Hemoglobin: 13.4 g/dL (ref 12.0–15.0)
MCH: 30.9 pg (ref 26.0–34.0)
MCHC: 34.8 g/dL (ref 30.0–36.0)
MCV: 88.9 fL (ref 78.0–100.0)
RBC: 4.33 MIL/uL (ref 3.87–5.11)

## 2011-08-06 LAB — DIFFERENTIAL
Basophils Absolute: 0 10*3/uL (ref 0.0–0.1)
Lymphocytes Relative: 11 % — ABNORMAL LOW (ref 12–46)
Monocytes Relative: 15 % — ABNORMAL HIGH (ref 3–12)
Neutro Abs: 9.6 10*3/uL — ABNORMAL HIGH (ref 1.7–7.7)
Neutrophils Relative %: 74 % (ref 43–77)

## 2011-08-06 LAB — COMPREHENSIVE METABOLIC PANEL
ALT: 26 U/L (ref 0–35)
AST: 30 U/L (ref 0–37)
Albumin: 2.9 g/dL — ABNORMAL LOW (ref 3.5–5.2)
Alkaline Phosphatase: 102 U/L (ref 39–117)
BUN: 22 mg/dL (ref 6–23)
Chloride: 94 mEq/L — ABNORMAL LOW (ref 96–112)
Potassium: 3.1 mEq/L — ABNORMAL LOW (ref 3.5–5.1)
Sodium: 133 mEq/L — ABNORMAL LOW (ref 135–145)
Total Bilirubin: 0.4 mg/dL (ref 0.3–1.2)

## 2011-08-06 LAB — URINE MICROSCOPIC-ADD ON

## 2011-08-06 MED ORDER — ONDANSETRON HCL 4 MG PO TABS: 4.0000 mg | ORAL_TABLET | Freq: Four times a day (QID) | ORAL | Status: DC | PRN

## 2011-08-06 MED ORDER — SODIUM CHLORIDE 0.9 % IV BOLUS (SEPSIS)
500.0000 mL | Freq: Once | INTRAVENOUS | Status: AC
  Administered 2011-08-06: 500 mL via INTRAVENOUS

## 2011-08-06 MED ORDER — CALCIUM CARBONATE-VITAMIN D 250-125 MG-UNIT PO TABS
1.0000 | ORAL_TABLET | Freq: Two times a day (BID) | ORAL | Status: DC
  Administered 2011-08-07 – 2011-08-09 (×7): 1 via ORAL
  Filled 2011-08-06 (×10): qty 1

## 2011-08-06 MED ORDER — OCUVITE-LUTEIN PO CAPS
1.0000 | ORAL_CAPSULE | Freq: Every day | ORAL | Status: DC
  Administered 2011-08-07 – 2011-08-10 (×5): 1 via ORAL
  Filled 2011-08-06 (×5): qty 1

## 2011-08-06 MED ORDER — ADULT MULTIVITAMIN W/MINERALS CH
1.0000 | ORAL_TABLET | Freq: Every day | ORAL | Status: DC
  Administered 2011-08-07 – 2011-08-10 (×4): 1 via ORAL
  Filled 2011-08-06 (×4): qty 1

## 2011-08-06 MED ORDER — KCL IN DEXTROSE-NACL 20-5-0.9 MEQ/L-%-% IV SOLN
INTRAVENOUS | Status: DC
  Administered 2011-08-07 – 2011-08-09 (×5): via INTRAVENOUS
  Filled 2011-08-06 (×9): qty 1000

## 2011-08-06 MED ORDER — ONDANSETRON HCL 4 MG/2ML IJ SOLN
4.0000 mg | Freq: Once | INTRAMUSCULAR | Status: AC
  Administered 2011-08-06: 4 mg via INTRAVENOUS
  Filled 2011-08-06: qty 2

## 2011-08-06 MED ORDER — ONDANSETRON HCL 4 MG/2ML IJ SOLN
4.0000 mg | Freq: Four times a day (QID) | INTRAMUSCULAR | Status: DC | PRN
  Administered 2011-08-07: 4 mg via INTRAVENOUS
  Filled 2011-08-06: qty 2

## 2011-08-06 MED ORDER — OMEGA-3-ACID ETHYL ESTERS 1 G PO CAPS
1.0000 g | ORAL_CAPSULE | Freq: Every day | ORAL | Status: DC
  Administered 2011-08-07 – 2011-08-10 (×4): 1 g via ORAL
  Filled 2011-08-06 (×4): qty 1

## 2011-08-06 MED ORDER — SODIUM CHLORIDE 0.9 % IV SOLN: INTRAVENOUS | Status: DC

## 2011-08-06 MED ORDER — ESTROGENS, CONJUGATED 0.625 MG/GM VA CREA
1.0000 | TOPICAL_CREAM | Freq: Every day | VAGINAL | Status: DC
  Administered 2011-08-08 – 2011-08-10 (×3): 1 via VAGINAL
  Filled 2011-08-06: qty 42.5

## 2011-08-06 MED ORDER — DEXTROSE 5 % IV SOLN
1.0000 g | Freq: Every day | INTRAVENOUS | Status: DC
  Administered 2011-08-07 – 2011-08-09 (×4): 1 g via INTRAVENOUS
  Filled 2011-08-06 (×5): qty 10

## 2011-08-06 MED ORDER — INSULIN ASPART 100 UNIT/ML ~~LOC~~ SOLN
0.0000 [IU] | Freq: Three times a day (TID) | SUBCUTANEOUS | Status: DC
  Administered 2011-08-07 – 2011-08-08 (×4): 2 [IU] via SUBCUTANEOUS
  Administered 2011-08-08 – 2011-08-09 (×3): 1 [IU] via SUBCUTANEOUS
  Administered 2011-08-09 – 2011-08-10 (×2): 2 [IU] via SUBCUTANEOUS

## 2011-08-06 MED ORDER — ALLOPURINOL 300 MG PO TABS
300.0000 mg | ORAL_TABLET | Freq: Every day | ORAL | Status: DC
  Administered 2011-08-07 – 2011-08-10 (×4): 300 mg via ORAL
  Filled 2011-08-06 (×4): qty 1

## 2011-08-06 NOTE — ED Notes (Signed)
Pt tolerated 1/2 ham sandwich with coke. Pt denies complaints of nausea. No vomiting noted.

## 2011-08-06 NOTE — ED Notes (Signed)
MD at bedside. 

## 2011-08-06 NOTE — ED Notes (Signed)
Receiving RN not available for report at this time. RN will return call.

## 2011-08-06 NOTE — Telephone Encounter (Signed)
Pt was seen on 08/04/11.  Per pt's daughter Santina Evans pt has been vomiting, still has fever, and her urine is a brownish color.  Pt took the cough medication but it came back up.  Pt's daughter would like to know any recommendations Dr. Tawanna Cooler has.  Pt has been trying to take Tylenol but fears taking it today due to vomiting.  Pt uses [facility] on [address].  Pls advise.

## 2011-08-06 NOTE — ED Notes (Signed)
hospitalist at bedside

## 2011-08-06 NOTE — Telephone Encounter (Signed)
Please call the daughter and have her take her to the emergency room for evaluation

## 2011-08-06 NOTE — Telephone Encounter (Signed)
Left message on machine returning patient's call 

## 2011-08-06 NOTE — H&P (Signed)
PCP:   Evette Georges, MD, MD   Chief Complaint: Nausea, vomiting, diarrhea   HPI: Adrienne [name] is an 77 y.o. female with history of hypertension, diabetes, recently diagnosed with influenza by her PCP, status post cholecystectomy, presents to the emergency room with two-day history of frequent watery diarrhea, nausea, vomiting, and abdominal cramps. About 3 days ago, she presents to PCP with fever, myalgia, URI symptoms, and was diagnosed with influenza. Her husband also had mild symptoms of diarrhea as well. She admitted to having polyuria, but no dysuria. Evaluation in the emergency room included a UA with 21 -50 WBCs, and many bacteria, mild leukocytosis with a white count of 13,000, sodium of 133 and potassium of 3.1, her creatinine is 1.36. Hospitalist was asked to admit patient for symptomatic treatment of nausea vomiting and diarrhea, along with treating her UTI.  Rewiew of Systems:  The patient denies anorexia, fever, weight loss,, vision loss, decreased hearing, hoarseness, chest pain, syncope, dyspnea on exertion, peripheral edema, balance deficits, hemoptysis, abdominal pain, melena, hematochezia, severe indigestion/heartburn, hematuria, incontinence, genital sores, muscle weakness, suspicious skin lesions, transient blindness, difficulty walking, depression, unusual weight change, abnormal bleeding, enlarged lymph nodes, angioedema, and breast masses.   Past Medical History  Diagnosis Date  . Hypertension   . Hyperlipidemia   . Diabetes mellitus     Past Surgical History  Procedure Date  . Appendectomy   . Cholecystectomy   . Total abdominal hysterectomy w/ bilateral salpingoophorectomy   . Breast surgery     Medications:  HOME MEDS: Prior to Admission medications   Medication Sig Start Date End Date Taking? Authorizing Provider  allopurinol (ZYLOPRIM) 300 MG tablet Take 1 tablet (300 mg total) by mouth daily. 06/01/11  Yes Roderick Pee, MD  calcium-vitamin D  250-100 MG-UNIT per tablet Take 1 tablet by mouth 2 (two) times daily.   Yes Historical Provider, MD  conjugated estrogens (PREMARIN) vaginal cream Place vaginally daily. 06/01/11  Yes Roderick Pee, MD  fish oil-omega-3 fatty acids 1000 MG capsule Take 1 g by mouth daily.   Yes Historical Provider, MD  glucose blood test strip 1 each by Other route as needed. Use as instructed    Yes Historical Provider, MD  HYDROcodone-homatropine [facility]) 5-1.5 MG/5ML syrup One half to 1 teaspoon 3 times daily when necessary for flu symptoms 08/04/11  Yes Roderick Pee, MD  metFORMIN (GLUCOPHAGE) 500 MG tablet Take 1 tablet (500 mg total) by mouth 2 (two) times daily with a meal. 06/01/11  Yes Roderick Pee, MD  Multiple Vitamins-Minerals (CENTRUM SILVER) tablet Take 1 tablet by mouth daily.     Yes Historical Provider, MD  Multiple Vitamins-Minerals (EYE VITAMINS) CAPS Take by mouth.   Yes Historical Provider, MD     Allergies:  Allergies  Allergen Reactions  . Penicillins Hives and Other (See Comments)    REACTION: swelling, hives    Social History:   reports that she has never smoked. She does not have any smokeless tobacco history on file. She reports that she does not drink alcohol or use illicit drugs.  Family History: Family History  Problem Relation Age of Onset  . Hypertension Mother   . Coronary artery disease Mother   . Hypertension Father      Physical Exam: Filed Vitals:   08/06/11 1706 08/06/11 2222  BP: 98/61 130/60  Pulse: 78 83  Temp: 97.8 F (36.6 C) 97.9 F (36.6 C)  TempSrc: Oral Oral  Resp: 20 24  Weight: 58.06 kg (128 lb)   SpO2: 99% 93%   Blood pressure 130/60, pulse 83, temperature 97.9 F (36.6 C), temperature source Oral, resp. rate 24, weight 58.06 kg (128 lb), SpO2 93.00%.  GEN:  Pleasant  person lying in the stretcher in no acute distress; cooperative with exam PSYCH:  alert and oriented x4; does not appear anxious or depressed; affect is  appropriate. HEENT: Mucous membranes pink and anicteric; PERRLA; EOM intact; no cervical lymphadenopathy nor thyromegaly or carotid bruit; no JVD; Breasts:: Not examined CHEST WALL: No tenderness CHEST: Normal respiration, clear to auscultation bilaterally HEART: Regular rate and rhythm; no murmurs rubs or gallops BACK: No kyphosis or scoliosis; no CVA tenderness ABDOMEN:  soft non-tender; no masses, no organomegaly, normal abdominal bowel sounds; no pannus; no intertriginous candida. Rectal Exam: Not done EXTREMITIES: No bone or joint deformity; age-appropriate arthropathy of the hands and knees; no edema; no ulcerations. Genitalia: not examined PULSES: 2+ and symmetric SKIN: Normal hydration no rash or ulceration CNS: Cranial nerves 2-12 grossly intact no focal lateralizing neurologic deficit   Labs & Imaging Results for orders placed during the hospital encounter of 08/06/11 (from the past 48 hour(s))  CBC     Status: Abnormal   Collection Time   08/06/11  7:30 PM      Component Value Range Comment   WBC 13.0 (*) 4.0 - 10.5 (K/uL)    RBC 4.33  3.87 - 5.11 (MIL/uL)    Hemoglobin 13.4  12.0 - 15.0 (g/dL)    HCT 40.9  81.1 - 91.4 (%)    MCV 88.9  78.0 - 100.0 (fL)    MCH 30.9  26.0 - 34.0 (pg)    MCHC 34.8  30.0 - 36.0 (g/dL)    RDW 78.2  95.6 - 21.3 (%)    Platelets 183  150 - 400 (K/uL)   DIFFERENTIAL     Status: Abnormal   Collection Time   08/06/11  7:30 PM      Component Value Range Comment   Neutrophils Relative 74  43 - 77 (%)    Lymphocytes Relative 11 (*) 12 - 46 (%)    Monocytes Relative 15 (*) 3 - 12 (%)    Eosinophils Relative 0  0 - 5 (%)    Basophils Relative 0  0 - 1 (%)    Neutro Abs 9.6 (*) 1.7 - 7.7 (K/uL)    Lymphs Abs 1.4  0.7 - 4.0 (K/uL)    Monocytes Absolute 2.0 (*) 0.1 - 1.0 (K/uL)    Eosinophils Absolute 0.0  0.0 - 0.7 (K/uL)    Basophils Absolute 0.0  0.0 - 0.1 (K/uL)    WBC Morphology MILD LEFT SHIFT (1-5% METAS, OCC MYELO, OCC BANDS)      COMPREHENSIVE METABOLIC PANEL     Status: Abnormal   Collection Time   08/06/11  7:30 PM      Component Value Range Comment   Sodium 133 (*) 135 - 145 (mEq/L)    Potassium 3.1 (*) 3.5 - 5.1 (mEq/L)    Chloride 94 (*) 96 - 112 (mEq/L)    CO2 21  19 - 32 (mEq/L)    Glucose, Bld 166 (*) 70 - 99 (mg/dL)    BUN 22  6 - 23 (mg/dL)    Creatinine, Ser 0.86 (*) 0.50 - 1.10 (mg/dL)    Calcium 8.8  8.4 - 10.5 (mg/dL)    Total Protein 6.9  6.0 - 8.3 (g/dL)    Albumin 2.9 (*)  3.5 - 5.2 (g/dL)    AST 30  0 - 37 (U/L)    ALT 26  0 - 35 (U/L)    Alkaline Phosphatase 102  39 - 117 (U/L)    Total Bilirubin 0.4  0.3 - 1.2 (mg/dL)    GFR calc non Af Amer 37 (*) >90 (mL/min)    GFR calc Af Amer 43 (*) >90 (mL/min)   LIPASE, BLOOD     Status: Normal   Collection Time   08/06/11  7:30 PM      Component Value Range Comment   Lipase 30  11 - 59 (U/L)   URINALYSIS, ROUTINE W REFLEX MICROSCOPIC     Status: Abnormal   Collection Time   08/06/11  8:08 PM      Component Value Range Comment   Color, Urine AMBER (*) YELLOW  BIOCHEMICALS MAY BE AFFECTED BY COLOR   APPearance TURBID (*) CLEAR     Specific Gravity, Urine 1.023  1.005 - 1.030     pH 5.5  5.0 - 8.0     Glucose, UA NEGATIVE  NEGATIVE (mg/dL)    Hgb urine dipstick LARGE (*) NEGATIVE     Bilirubin Urine MODERATE (*) NEGATIVE     Ketones, ur 15 (*) NEGATIVE (mg/dL)    Protein, ur 409 (*) NEGATIVE (mg/dL)    Urobilinogen, UA 1.0  0.0 - 1.0 (mg/dL)    Nitrite NEGATIVE  NEGATIVE     Leukocytes, UA LARGE (*) NEGATIVE    URINE MICROSCOPIC-ADD ON     Status: Abnormal   Collection Time   08/06/11  8:08 PM      Component Value Range Comment   Squamous Epithelial / LPF MANY (*) RARE     WBC, UA 21-50  <3 (WBC/hpf)    RBC / HPF 7-10  <3 (RBC/hpf)    Bacteria, UA MANY (*) RARE     Urine-Other MUCOUS PRESENT      No results found.    Assessment Present on Admission:  .RENAL INSUFFICIENCY, ACUTE .Influenza .DIABETES MELLITUS, TYPE  II .HYPERTENSION .HYPERLIPIDEMIA .UTI (urinary tract infection)   PLAN: Will admit her for IV rehydration, and follow her creatinine carefully. Will replete her potassium in the IV fluid, and daily by mouth potassium as well. She is too far out for Tamiflu since the beginning of her symptoms. We'll continue all other medications, except for metformin. This can be resumed once her creatinine improved, and she is able to eat full meals. I will put on a sensitive insulin sliding scale. Her urinary tract infection will be treated with Rocephin. She is stable, full code, and will be admitted to triad hospitalist service. Thank you for the opportunity to partake in the care of this pleasant patient.   Other plans as per orders.    Kato Wieczorek 08/06/2011, 10:50 PM

## 2011-08-06 NOTE — ED Notes (Signed)
Pt called PCP Dr. Tawanna Cooler and was advised to come to the ED due to flu like symptoms. Pt reports n/v/d and inability to keep down food or drink.

## 2011-08-06 NOTE — ED Provider Notes (Signed)
History     CSN: [id number]  Arrival date & time 08/06/11  1701   First MD Initiated Contact with Patient 08/06/11 1852      Chief Complaint  Patient presents with  . Nausea  . Emesis  . Diarrhea    (Consider location/radiation/quality/duration/timing/severity/associated sxs/prior treatment) The history is provided by the patient.   patient has had nausea vomiting diarrhea. Began 2 days ago. She states she's had fevers and chills before that. She was seen her primary care doctor and told she had the flu. After that she developed nausea vomiting diarrhea. Mild abdominal pain. She states her muscles hurt and she feels fatigued. No clear sick contacts. She states she's not been able to keep anything down.   Past Medical History  Diagnosis Date  . Hypertension   . Hyperlipidemia   . Diabetes mellitus     Past Surgical History  Procedure Date  . Appendectomy   . Cholecystectomy   . Total abdominal hysterectomy w/ bilateral salpingoophorectomy   . Breast surgery     Family History  Problem Relation Age of Onset  . Hypertension Mother   . Coronary artery disease Mother   . Hypertension Father     History  Substance Use Topics  . Smoking status: Never Smoker   . Smokeless tobacco: Not on file  . Alcohol Use: No    OB History    Grav Para Term Preterm Abortions TAB SAB Ect Mult Living                  Review of Systems  Constitutional: Positive for appetite change and fatigue. Negative for activity change.  HENT: Negative for neck stiffness.   Eyes: Negative for pain.  Respiratory: Negative for chest tightness and shortness of breath.   Cardiovascular: Negative for chest pain and leg swelling.  Gastrointestinal: Positive for nausea, vomiting and diarrhea. Negative for abdominal pain.  Genitourinary: Negative for flank pain.  Musculoskeletal: Negative for back pain.  Skin: Negative for rash.  Neurological: Positive for dizziness. Negative for weakness, numbness  and headaches.  Psychiatric/Behavioral: Negative for behavioral problems.    Allergies  Penicillins  Home Medications   No current outpatient prescriptions on file.  BP 129/81  Pulse 80  Temp(Src) 98.2 F (36.8 C) (Oral)  Resp 20  Ht 5\' 3"  (1.6 m)  Wt 128 lb (58.06 kg)  BMI 22.67 kg/m2  SpO2 96%  Physical Exam  Nursing note and vitals reviewed. Constitutional: She is oriented to person, place, and time. She appears well-developed and well-nourished.  HENT:  Head: Normocephalic and atraumatic.  Eyes: EOM are normal. Pupils are equal, round, and reactive to light.  Neck: Normal range of motion. Neck supple.  Cardiovascular: Normal rate, regular rhythm and normal heart sounds.   No murmur heard. Pulmonary/Chest: Effort normal and breath sounds normal. No respiratory distress. She has no wheezes. She has no rales.  Abdominal: Soft. Bowel sounds are normal. She exhibits no distension. There is no tenderness. There is no rebound and no guarding.  Musculoskeletal: Normal range of motion.  Neurological: She is alert and oriented to person, place, and time. No cranial nerve deficit.  Skin: Skin is warm and dry.  Psychiatric: She has a normal mood and affect. Her speech is normal.    ED Course  Procedures (including critical care time)  Labs Reviewed  CBC - Abnormal; Notable for the following:    WBC 13.0 (*)    All other components within normal limits  DIFFERENTIAL - Abnormal; Notable for the following:    Lymphocytes Relative 11 (*)    Monocytes Relative 15 (*)    Neutro Abs 9.6 (*)    Monocytes Absolute 2.0 (*)    All other components within normal limits  COMPREHENSIVE METABOLIC PANEL - Abnormal; Notable for the following:    Sodium 133 (*)    Potassium 3.1 (*)    Chloride 94 (*)    Glucose, Bld 166 (*)    Creatinine, Ser 1.36 (*)    Albumin 2.9 (*)    GFR calc non Af Amer 37 (*)    GFR calc Af Amer 43 (*)    All other components within normal limits    URINALYSIS, ROUTINE W REFLEX MICROSCOPIC - Abnormal; Notable for the following:    Color, Urine AMBER (*) BIOCHEMICALS MAY BE AFFECTED BY COLOR   APPearance TURBID (*)    Hgb urine dipstick LARGE (*)    Bilirubin Urine MODERATE (*)    Ketones, ur 15 (*)    Protein, ur 100 (*)    Leukocytes, UA LARGE (*)    All other components within normal limits  URINE MICROSCOPIC-ADD ON - Abnormal; Notable for the following:    Squamous Epithelial / LPF MANY (*)    Bacteria, UA MANY (*)    All other components within normal limits  LIPASE, BLOOD  BASIC METABOLIC PANEL  CBC  STOOL CULTURE  CLOSTRIDIUM DIFFICILE BY PCR   No results found.   1. UTI (urinary tract infection)   2. Nausea vomiting and diarrhea   3. Dehydration   4. Gout   5. Vaginitis, atrophic       MDM  This is a nausea vomiting diarrhea. Unable to tolerate orals. Initial blood pressure was low. She her creatinine is increased a little bit. She is an apparent urinary tract infection. She'll be admitted to medicine.  Juliet Rude. Rubin Payor, MD 08/07/11 0010

## 2011-08-06 NOTE — Telephone Encounter (Signed)
Spoke with daughter

## 2011-08-06 NOTE — Telephone Encounter (Signed)
Pts daughter called and said that her mom refuses to go to ER. Pls call pt at her home #. Pls call after 4:45pm, so that daughter makes sure pt pick up phone.

## 2011-08-06 NOTE — Telephone Encounter (Signed)
Patient went to ER>

## 2011-08-07 LAB — CBC
HCT: 34.8 % — ABNORMAL LOW (ref 36.0–46.0)
MCH: 30.8 pg (ref 26.0–34.0)
MCV: 90.2 fL (ref 78.0–100.0)
RBC: 3.86 MIL/uL — ABNORMAL LOW (ref 3.87–5.11)
WBC: 10.7 10*3/uL — ABNORMAL HIGH (ref 4.0–10.5)

## 2011-08-07 LAB — GLUCOSE, CAPILLARY: Glucose-Capillary: 168 mg/dL — ABNORMAL HIGH (ref 70–99)

## 2011-08-07 LAB — BASIC METABOLIC PANEL
CO2: 24 mEq/L (ref 19–32)
Chloride: 97 mEq/L (ref 96–112)
Glucose, Bld: 169 mg/dL — ABNORMAL HIGH (ref 70–99)
Sodium: 133 mEq/L — ABNORMAL LOW (ref 135–145)

## 2011-08-07 MED ORDER — POTASSIUM CHLORIDE CRYS ER 20 MEQ PO TBCR
40.0000 meq | EXTENDED_RELEASE_TABLET | Freq: Every day | ORAL | Status: DC
  Administered 2011-08-07 – 2011-08-10 (×4): 40 meq via ORAL
  Filled 2011-08-07 (×4): qty 2

## 2011-08-07 NOTE — Progress Notes (Signed)
UR completed 

## 2011-08-07 NOTE — Clinical Documentation Improvement (Signed)
RENAL FAILURE DOCUMENTATION CLARIFICATION QUERY  THIS DOCUMENT IS NOT A PERMANENT PART OF THE MEDICAL RECORD  TO RESPOND TO THE THIS QUERY, FOLLOW THE INSTRUCTIONS BELOW:  1. If needed, update documentation for the patient's encounter via the notes activity.  2. Access this query again and click edit on the In [employer].  3. After updating, or not, click F2 to complete all highlighted (required) fields concerning your review. Select "additional documentation in the medical record" OR "no additional documentation provided".  4. Click Sign note button.  5. The deficiency will fall out of your In Basket *Please let us know if you are not able to complete this workflow by phone or e-mail (listed below).  Please update your documentation within the medical record to reflect your response to this query.                                                                                    08/07/11  Dear Dr.[facility],  In a better effort to capture your patient's severity of illness, reflect appropriate length of stay and utilization of resources, a review of the patient medical record has revealed the following indicators.    Based on your clinical judgment, please clarify and document in a progress note and/or discharge summary the clinical condition associated with the following supporting information:  In responding to this query please exercise your independent judgment.  The fact that a query is asked, does not imply that any particular answer is desired or expected. 08/06/11 H&P..."RENAL INSUFFICIENCY, ACUTE.".Marland KitchenMarland KitchenFor accurate Dx specificity & severity can noted "Renal Insuff,Acute" be further specified. Thank you    Possible Clinical Conditions?  Acute Renal Failure Acute Kidney Injury Acute Tubular Necrosis Acute Renal Cortical Necrosis Acute Renal Medullary Necrosis Acute on Chronic Renal Failure Chronic Renal Failure Anuria Oliguria Other Condition Cannot  Clinically Determine   Supporting Information:  Risk Factors: 08/06/11 H&P..." presents to the emergency room with two-day history of frequent watery diarrhea, nausea, vomiting, and abdominal cramps.",,,  Signs and Symptoms: see above note  Diagnostics: 08/06/11: BUN 22  Creatinine, Ser 1.36 (*)  GFR calc non Af Amer 37 (*)   Treatments: 08/06/11 H&P "PLAN: Will admit her for IV rehydration, and follow her creatinine carefully. Will replete her potassium in the IV fluid, and daily by mouth potassium as well..."                   You may use possible, probable, or suspect with inpatient documentation. possible, probable, suspected diagnoses MUST be documented at the time of discharge  Reviewed:  no additional documentation provided: 08/12/11 dc summ rev'd, no add doc noted.orm  Thank You,  Toribio Harbour, RN, BSN, CCDS Certified Clinical Documentation Specialist Pager: [phone]  Health Information Management [place]

## 2011-08-07 NOTE — Progress Notes (Signed)
Subjective: Patient states that she feels somewhat better today. Patient's husband is at the bedside. The patient relates that her symptoms started off with body aches and chills approximately 4 days ago. It then progressed to vomiting and diarrhea. The patient was seen by her primary care physician Dr. Tawanna Cooler who assessed the patient had flulike symptoms. However I checked at Dr. Nelida Meuse office and his nurse Fleet Contras states that there was never PCR sent to verify that the patient does have influenza. Thus the diagnosis of influenza A or B was never made.  The patient tolerated clear liquids today and has had no further vomiting. The patient is requesting solid food.  Objective: Filed Vitals:   08/06/11 2222 08/06/11 2320 08/07/11 0517 08/07/11 1419  BP: 130/60 129/81 111/62 104/64  Pulse: 83 80 90 96  Temp: 97.9 F (36.6 C) 98.2 F (36.8 C) 99.5 F (37.5 C) 97.5 F (36.4 C)  TempSrc: Oral Oral Oral Oral  Resp: 24 20 19 16   Height:  5\' 3"  (1.6 m)    Weight:  58.06 kg (128 lb)    SpO2: 93% 96% 94% 96%   Weight change:   Intake/Output Summary (Last 24 hours) at 08/07/11 1928 Last data filed at 08/07/11 0600  Gross per 24 hour  Intake 843.33 ml  Output      0 ml  Net 843.33 ml    General: Alert, awake, oriented x3, in no acute distress.  HEENT: [place]/AT PEERL, EOMI Neck: Trachea midline,  no masses, no thyromegal,y no JVD, no carotid bruit OROPHARYNX:  Moist, No exudate/ erythema/lesions.  Heart: Regular rate and rhythm, without murmurs, rubs, gallops, PMI non-displaced, no heaves or thrills on palpation.  Lungs: Clear to auscultation, no wheezing or rhonchi noted. No increased vocal fremitus resonant to percussion  Abdomen: Soft, nontender, nondistended, positive bowel sounds, no masses no hepatosplenomegaly noted..  Neuro: No focal neurological deficits noted cranial nerves II through XII grossly intact. DTRs 2+ bilaterally upper and lower extremities. Strength functional in bilateral  upper and lower extremities.   Lab Results:  [facility] 08/07/11 0420 08/06/11 1930  NA 133* 133*  K 3.0* 3.1*  CL 97 94*  CO2 24 21  GLUCOSE 169* 166*  BUN 20 22  CREATININE 1.25* 1.36*  CALCIUM 8.5 8.8  MG -- --  PHOS -- --    Basename 08/06/11 1930  AST 30  ALT 26  ALKPHOS 102  BILITOT 0.4  PROT 6.9  ALBUMIN 2.9*    Basename 08/06/11 1930  LIPASE 30  AMYLASE --    Basename 08/07/11 0420 08/06/11 1930  WBC 10.7* 13.0*  NEUTROABS -- 9.6*  HGB 11.9* 13.4  HCT 34.8* 38.5  MCV 90.2 88.9  PLT 166 183   No results found for this basename: CKTOTAL:3,CKMB:3,CKMBINDEX:3,TROPONINI:3 in the last 72 hours No components found with this basename: POCBNP:3 No results found for this basename: DDIMER:2 in the last 72 hours No results found for this basename: HGBA1C:2 in the last 72 hours No results found for this basename: CHOL:2,HDL:2,LDLCALC:2,TRIG:2,CHOLHDL:2,LDLDIRECT:2 in the last 72 hours No results found for this basename: TSH,T4TOTAL,FREET3,T3FREE,THYROIDAB in the last 72 hours No results found for this basename: VITAMINB12:2,FOLATE:2,FERRITIN:2,TIBC:2,IRON:2,RETICCTPCT:2 in the last 72 hours  Micro Results: Recent Results (from the past 240 hour(s))  CLOSTRIDIUM DIFFICILE BY PCR     Status: Normal   Collection Time   08/07/11  2:18 PM      Component Value Range Status Comment   C difficile by pcr NEGATIVE  NEGATIVE  Final  Studies/Results: Ct Head Wo Contrast  07/12/2011  *RADIOLOGY REPORT*  Clinical Data: Status post fall, trauma to the back of head.  CT HEAD WITHOUT CONTRAST  Technique:  Contiguous axial images were obtained from the base of the skull through the vertex without contrast.  Comparison: 06/24/2009  Findings: Prominence of the sulci, cisterns, and ventricles, in keeping with age appropriate cerebral volume loss. There is no evidence for acute hemorrhage, hydrocephalus, mass lesion, or abnormal extra-axial fluid collection.  No definite CT evidence  for acute infarction. Mild partial ethmoid air cell opacification.  The visualized paranasal sinuses and mastoid air cells are otherwise predominately clear.  IMPRESSION: No definite acute intracranial abnormality.  Original Report Authenticated By: Waneta Martins, M.D.    Medications: I have reviewed the patient's current medications. Scheduled Meds:   . allopurinol  300 mg Oral Daily  . calcium-vitamin D  1 tablet Oral BID  . cefTRIAXone (ROCEPHIN)  IV  1 g Intravenous QHS  . conjugated estrogens  1 Applicatorful Vaginal Daily  . insulin aspart  0-9 Units Subcutaneous TID WC  . mulitivitamin with minerals  1 tablet Oral Daily  . multivitamin-lutein  1 capsule Oral Q1400  . omega-3 acid ethyl esters  1 g Oral Daily  . ondansetron  4 mg Intravenous Once  . potassium chloride  40 mEq Oral Daily  . sodium chloride  500 mL Intravenous Once  . sodium chloride  500 mL Intravenous Once   Continuous Infusions:   . dextrose 5 % and 0.9 % NaCl with KCl 20 mEq/L 100 mL/hr at 08/07/11 1022  . DISCONTD: sodium chloride     PRN Meds:.ondansetron (ZOFRAN) IV, ondansetron Assessment/Plan: Patient Active Hospital Problem List: Influenza (08/04/2011)   Assessment: After checking with Dr. Nelida Meuse office the patient does not have a diagnosis of influenza. She likely has a urinary tract infection which presented with a prodrome of myalgias and chills and has progressed to nausea and vomiting. I suspect that the patient is to respond to antibiotics and supportive care. Thus I will continue IV fluids, antibiotics and advance her diet as tolerated.    DIABETES MELLITUS, TYPE II (05/31/2007)   Assessment: Hemoglobin A1c 6.5 approximately one month ago. We'll not repeated during this hospitalization. We'll continue to cover with sliding-scale insulin until tolerating a solid diet.    HYPERTENSION (08/10/2007)   Assessment: Blood pressure well-controlled     RENAL INSUFFICIENCY, ACUTE (05/28/2010)    Assessment: Patient has a baseline creatinine of 1.1. She currently has mild renal sufficiency which is likely is prerenal secondary to vomiting and diarrhea. Continue IV fluids and reassess in the morning.    UTI (urinary tract infection) (08/06/2011)   Assessment: Urine culture pending at this time we'll continue Rocephin.       LOS: 1 day

## 2011-08-08 LAB — GLUCOSE, CAPILLARY
Glucose-Capillary: 186 mg/dL — ABNORMAL HIGH (ref 70–99)
Glucose-Capillary: 141 mg/dL — ABNORMAL HIGH (ref 70–99)
Glucose-Capillary: 136 mg/dL — ABNORMAL HIGH (ref 70–99)

## 2011-08-08 MED ORDER — LOPERAMIDE HCL 2 MG PO CAPS: 2.0000 mg | ORAL_CAPSULE | ORAL | Status: DC | PRN

## 2011-08-08 NOTE — Progress Notes (Signed)
Subjective: Patient states that she feels somewhat better today. Patient's husband is at the bedside.   Interval history: C.diff (-), Still with some diarrhea.  Objective: Filed Vitals:   08/07/11 0517 08/07/11 1419 08/07/11 1935 08/08/11 0505  BP: 111/62 104/64 102/62 109/68  Pulse: 90 96 96 83  Temp: 99.5 F (37.5 C) 97.5 F (36.4 C) 97.5 F (36.4 C) 98.4 F (36.9 C)  TempSrc: Oral Oral Oral Oral  Resp: 19 16 16 18   Height:      Weight:      SpO2: 94% 96% 97% 93%   Weight change:   Intake/Output Summary (Last 24 hours) at 08/08/11 1348 Last data filed at 08/08/11 1300  Gross per 24 hour  Intake 2971.67 ml  Output      0 ml  Net 2971.67 ml    General: Alert, awake, oriented x3, in no acute distress.  HEENT: [place]/AT PEERL, EOMI Neck: Trachea midline,  no masses, no thyromegal,y no JVD, no carotid bruit OROPHARYNX:  Moist, No exudate/ erythema/lesions.  Heart: Regular rate and rhythm, without murmurs, rubs, gallops, PMI non-displaced, no heaves or thrills on palpation.  Lungs: Clear to auscultation, no wheezing or rhonchi noted. No increased vocal fremitus resonant to percussion  Abdomen: Soft, nontender, nondistended, positive bowel sounds, no masses no hepatosplenomegaly noted..  Neuro: No focal neurological deficits noted cranial nerves II through XII grossly intact. DTRs 2+ bilaterally upper and lower extremities. Strength functional in bilateral upper and lower extremities.   Lab Results:  [facility] 08/07/11 0420 08/06/11 1930  NA 133* 133*  K 3.0* 3.1*  CL 97 94*  CO2 24 21  GLUCOSE 169* 166*  BUN 20 22  CREATININE 1.25* 1.36*  CALCIUM 8.5 8.8  MG -- --  PHOS -- --    Basename 08/06/11 1930  AST 30  ALT 26  ALKPHOS 102  BILITOT 0.4  PROT 6.9  ALBUMIN 2.9*    Basename 08/06/11 1930  LIPASE 30  AMYLASE --    Basename 08/07/11 0420 08/06/11 1930  WBC 10.7* 13.0*  NEUTROABS -- 9.6*  HGB 11.9* 13.4  HCT 34.8* 38.5  MCV 90.2 88.9  PLT 166 183    No results found for this basename: CKTOTAL:3,CKMB:3,CKMBINDEX:3,TROPONINI:3 in the last 72 hours No components found with this basename: POCBNP:3 No results found for this basename: DDIMER:2 in the last 72 hours No results found for this basename: HGBA1C:2 in the last 72 hours No results found for this basename: CHOL:2,HDL:2,LDLCALC:2,TRIG:2,CHOLHDL:2,LDLDIRECT:2 in the last 72 hours No results found for this basename: TSH,T4TOTAL,FREET3,T3FREE,THYROIDAB in the last 72 hours No results found for this basename: VITAMINB12:2,FOLATE:2,FERRITIN:2,TIBC:2,IRON:2,RETICCTPCT:2 in the last 72 hours  Micro Results: Recent Results (from the past 240 hour(s))  CLOSTRIDIUM DIFFICILE BY PCR     Status: Normal   Collection Time   08/07/11  2:18 PM      Component Value Range Status Comment   C difficile by pcr NEGATIVE  NEGATIVE  Final     Studies/Results: Ct Head Wo Contrast  07/12/2011  *RADIOLOGY REPORT*  Clinical Data: Status post fall, trauma to the back of head.  CT HEAD WITHOUT CONTRAST  Technique:  Contiguous axial images were obtained from the base of the skull through the vertex without contrast.  Comparison: 06/24/2009  Findings: Prominence of the sulci, cisterns, and ventricles, in keeping with age appropriate cerebral volume loss. There is no evidence for acute hemorrhage, hydrocephalus, mass lesion, or abnormal extra-axial fluid collection.  No definite CT evidence for acute infarction. Mild  partial ethmoid air cell opacification.  The visualized paranasal sinuses and mastoid air cells are otherwise predominately clear.  IMPRESSION: No definite acute intracranial abnormality.  Original Report Authenticated By: Waneta Martins, M.D.    Medications: I have reviewed the patient's current medications. Scheduled Meds:    . allopurinol  300 mg Oral Daily  . calcium-vitamin D  1 tablet Oral BID  . cefTRIAXone (ROCEPHIN)  IV  1 g Intravenous QHS  . conjugated estrogens  1 Applicatorful  Vaginal Daily  . insulin aspart  0-9 Units Subcutaneous TID WC  . mulitivitamin with minerals  1 tablet Oral Daily  . multivitamin-lutein  1 capsule Oral Q1400  . omega-3 acid ethyl esters  1 g Oral Daily  . potassium chloride  40 mEq Oral Daily   Continuous Infusions:    . dextrose 5 % and 0.9 % NaCl with KCl 20 mEq/L 100 mL/hr at 08/08/11 0655   PRN Meds:.ondansetron (ZOFRAN) IV, ondansetron Assessment/Plan: Patient Active Hospital Problem List: Influenza (08/04/2011)   Assessment: After checking with Dr. Nelida Meuse office the patient does not have a diagnosis of influenza. She likely has a urinary tract infection which presented with a prodrome of myalgias and chills and has progressed to nausea and vomiting. I suspect that the patient is to respond to antibiotics and supportive care. Thus I will continue IV fluids, antibiotics and advance her diet as tolerated.    DIABETES MELLITUS, TYPE II (05/31/2007)   Assessment: Hemoglobin A1c 6.5 approximately one month ago. We'll not repeated during this hospitalization. We'll continue to cover with sliding-scale insulin until tolerating a solid diet.    HYPERTENSION (08/10/2007)   Assessment: Blood pressure well-controlled     RENAL INSUFFICIENCY, ACUTE (05/28/2010)   Assessment: Patient has a baseline creatinine of 1.1. She currently has mild renal sufficiency which is likely is prerenal secondary to vomiting and diarrhea.Cr. Improving.  Continue IV fluids and reassess in the morning.    UTI (urinary tract infection) (08/06/2011)   Assessment: Urine culture pending at this time we'll continue Rocephin.       LOS: 2 days

## 2011-08-09 LAB — URINALYSIS, ROUTINE W REFLEX MICROSCOPIC
Bilirubin Urine: NEGATIVE
Glucose, UA: NEGATIVE mg/dL
Hgb urine dipstick: NEGATIVE
Specific Gravity, Urine: 1.011 (ref 1.005–1.030)
Urobilinogen, UA: 0.2 mg/dL (ref 0.0–1.0)

## 2011-08-09 LAB — BASIC METABOLIC PANEL
BUN: 11 mg/dL (ref 6–23)
CO2: 28 mEq/L (ref 19–32)
Calcium: 10 mg/dL (ref 8.4–10.5)
Creatinine, Ser: 1.03 mg/dL (ref 0.50–1.10)
GFR calc non Af Amer: 52 mL/min — ABNORMAL LOW (ref >90)
Glucose, Bld: 121 mg/dL — ABNORMAL HIGH (ref 70–99)

## 2011-08-09 MED ORDER — METFORMIN HCL 500 MG PO TABS
500.0000 mg | ORAL_TABLET | Freq: Two times a day (BID) | ORAL | Status: DC
  Administered 2011-08-09 – 2011-08-10 (×2): 500 mg via ORAL
  Filled 2011-08-09 (×3): qty 1

## 2011-08-10 LAB — GLUCOSE, CAPILLARY
Glucose-Capillary: 131 mg/dL — ABNORMAL HIGH (ref 70–99)
Glucose-Capillary: 151 mg/dL — ABNORMAL HIGH (ref 70–99)
Glucose-Capillary: 95 mg/dL (ref 70–99)

## 2011-08-10 LAB — URINE CULTURE
Colony Count: NO GROWTH
Culture: NO GROWTH
Special Requests: NORMAL

## 2011-08-10 MED ORDER — ENOXAPARIN SODIUM 40 MG/0.4ML ~~LOC~~ SOLN
40.0000 mg | SUBCUTANEOUS | Status: DC
  Filled 2011-08-10: qty 0.4

## 2011-08-10 MED ORDER — CALCIUM CARBONATE-VITAMIN D 500-200 MG-UNIT PO TABS
1.0000 | ORAL_TABLET | Freq: Two times a day (BID) | ORAL | Status: DC
  Administered 2011-08-10: 1 via ORAL
  Filled 2011-08-10 (×2): qty 1

## 2011-08-10 NOTE — Progress Notes (Signed)
Patient and her husband given discharge instructions and they verbalized understanding.  Patient stable for discharge home with her husband.  Patient requested information on carbohydrate counting so information was printed and provided.  Patient understands to follow up with her MD within 1 week.  Allayne Butcher [facility] 08/10/2011 3:50 PM

## 2011-08-10 NOTE — Discharge Summary (Signed)
Adrienne [name] MRN: [id number] DOB/AGE: 75/06/1934 75 y.o.  Admit date: 08/06/2011 Discharge date: 08/10/2011  Primary Care Physician:  Evette Georges, MD, MD   Discharge Diagnoses:   Patient Active Problem List  Diagnoses  . DIABETES MELLITUS, TYPE II  . HYPERLIPIDEMIA  . HYPERTENSION  . OSTEOPENIA  . RENAL INSUFFICIENCY, ACUTE  . HEMATURIA, HX OF  . Gout  . Vaginitis, atrophic  . Viral URI  . Influenza  . UTI (urinary tract infection)    DISCHARGE MEDICATION: Medication List  As of 08/10/2011 12:43 PM   STOP taking these medications         simvastatin 40 MG tablet         TAKE these medications         allopurinol 300 MG tablet   Commonly known as: ZYLOPRIM   Take 1 tablet (300 mg total) by mouth daily.      calcium-vitamin D 250-100 MG-UNIT per tablet   Take 1 tablet by mouth 2 (two) times daily.      Eye Vitamins Caps   Take by mouth.      CENTRUM SILVER tablet   Take 1 tablet by mouth daily.      conjugated estrogens vaginal cream   Commonly known as: PREMARIN   Place vaginally daily.      fish oil-omega-3 fatty acids 1000 MG capsule   Take 1 g by mouth daily.      glucose blood test strip   1 each by Other route as needed. Use as instructed      HYDROcodone-homatropine 5-1.5 MG/5ML syrup   Commonly known as: HYCODAN   One half to 1 teaspoon 3 times daily when necessary for flu symptoms      metFORMIN 500 MG tablet   Commonly known as: GLUCOPHAGE   Take 1 tablet (500 mg total) by mouth 2 (two) times daily with a meal.              Consults:     SIGNIFICANT DIAGNOSTIC STUDIES:  Ct Head Wo Contrast  07/12/2011  *RADIOLOGY REPORT*  Clinical Data: Status post fall, trauma to the back of head.  CT HEAD WITHOUT CONTRAST  Technique:  Contiguous axial images were obtained from the base of the skull through the vertex without contrast.  Comparison: 06/24/2009  Findings: Prominence of the sulci, cisterns, and ventricles, in keeping with age  appropriate cerebral volume loss. There is no evidence for acute hemorrhage, hydrocephalus, mass lesion, or abnormal extra-axial fluid collection.  No definite CT evidence for acute infarction. Mild partial ethmoid air cell opacification.  The visualized paranasal sinuses and mastoid air cells are otherwise predominately clear.  IMPRESSION: No definite acute intracranial abnormality.  Original Report Authenticated By: Waneta Martins, M.D.         Recent Results (from the past 240 hour(s))  CLOSTRIDIUM DIFFICILE BY PCR     Status: Normal   Collection Time   08/07/11  2:18 PM      Component Value Range Status Comment   C difficile by pcr NEGATIVE  NEGATIVE  Final     BRIEF ADMITTING H & P: Adrienne [name] is an 77 y.o. female with history of hypertension, diabetes, recently diagnosed with influenza by her PCP, status post cholecystectomy, presents to the emergency room with two-day history of frequent watery diarrhea, nausea, vomiting, and abdominal cramps. About 3 days ago, she presents to PCP with fever, myalgia, URI symptoms, and was diagnosed with influenza. Her husband also  had mild symptoms of diarrhea as well. She admitted to having polyuria, but no dysuria. Evaluation in the emergency room included a UA with 21 -50 WBCs, and many bacteria, mild leukocytosis with a white count of 13,000, sodium of 133 and potassium of 3.1, her creatinine is 1.36. Hospitalist was asked to admit patient for symptomatic treatment of nausea vomiting and diarrhea, along with treating her UTI.       Hospital Course:  Present on Admission:  .Diarrhea: Pt had several days of diarrhea and was dehydrated at theme of admission. It is felt that the diarrhea was due to the UTI. Pt was treated for the UTI and the diarrhea has almost subsided. She has been prescribed immodium on an as needed basis.  .Acute Kidney Injury: Felt to be secondary to dehydration which occurred as a result of the diarrhea.  Resolved with  IVF.  Marland KitchenInfluenza: Pt was never diagnosed with Influenza but rather was told that she had flu like symptoms.  Marland KitchenDIABETES MELLITUS, TYPE II: BS well controlled.  Marland KitchenHYPERTENSION: BP well controlled.  Marland KitchenUTI (urinary tract infection): pt was treated empirically with Rocephin. Repeat urinalysis showed no elements consistent with UTI.  Disposition and Follow-up:  F/U with Dr. Tawanna Cooler in 1 week.   DISCHARGE EXAM:  General: Alert, awake, oriented x3, in no acute distress. Vital Signs: Blood pressure 132/84, pulse 103, temperature 97.9 F (36.6 C), temperature source Oral, resp. rate 20, height 5\' 3"  (1.6 m), weight 59.6 kg (131 lb 6.3 oz), SpO2 95.00%. HEENT: /AT PEERL, EOMI  Neck: Trachea midline, no masses, no thyromegal,y no JVD, no carotid bruit  OROPHARYNX: Moist, No exudate/ erythema/lesions.  Heart: Regular rate and rhythm, without murmurs, rubs, gallops, PMI non-displaced, no heaves or thrills on palpation.  Lungs: Clear to auscultation, no wheezing or rhonchi noted. No increased vocal fremitus resonant to percussion  Abdomen: Soft, nontender, nondistended, positive bowel sounds, no masses no hepatosplenomegaly noted..  Neuro: No focal neurological deficits noted cranial nerves II through XII grossly intact. DTRs 2+ bilaterally upper and lower extremities. Strength functional in bilateral upper and lower extremities.   Basename 08/09/11 1315  NA 141  K 4.3  CL 104  CO2 28  GLUCOSE 121*  BUN 11  CREATININE 1.03  CALCIUM 10.0  MG --  PHOS --   No results found for this basename: AST:2,ALT:2,ALKPHOS:2,BILITOT:2,PROT:2,ALBUMIN:2 in the last 72 hours No results found for this basename: LIPASE:2,AMYLASE:2 in the last 72 hours No results found for this basename: WBC:2,NEUTROABS:2,HGB:2,HCT:2,MCV:2,PLT:2 in the last 72 hours  Total time for D/C process including face to face tome is approximately 40 minutes.  Signed: Jauna Raczynski A. 08/10/2011, 12:43 PM

## 2011-08-12 ENCOUNTER — Telehealth: Payer: Self-pay | Admitting: Family Medicine

## 2011-08-12 NOTE — Telephone Encounter (Signed)
Left message on machine.  Okay for patient to be worked in early Friday afternoon.

## 2011-08-12 NOTE — Telephone Encounter (Signed)
Patient called stating that she just got out of the hosp and need a post hosp fu. There are no appt times. Please assist.

## 2011-08-14 ENCOUNTER — Ambulatory Visit (INDEPENDENT_AMBULATORY_CARE_PROVIDER_SITE_OTHER): Payer: Medicare Other | Admitting: Family Medicine

## 2011-08-14 ENCOUNTER — Encounter: Payer: Self-pay | Admitting: Family Medicine

## 2011-08-14 VITALS — BP 102/62 | Temp 97.6°F | Wt 129.0 lb

## 2011-08-14 DIAGNOSIS — Z87440 Personal history of urinary (tract) infections: Secondary | ICD-10-CM

## 2011-08-14 DIAGNOSIS — E119 Type 2 diabetes mellitus without complications: Secondary | ICD-10-CM

## 2011-08-14 LAB — CBC WITH DIFFERENTIAL/PLATELET
Basophils Absolute: 0.1 10*3/uL (ref 0.0–0.1)
Eosinophils Absolute: 0.1 10*3/uL (ref 0.0–0.7)
HCT: 38.8 % (ref 36.0–46.0)
Hemoglobin: 12.8 g/dL (ref 12.0–15.0)
Lymphocytes Relative: 23.5 % (ref 12.0–46.0)
Lymphs Abs: 2.5 10*3/uL (ref 0.7–4.0)
MCHC: 32.9 g/dL (ref 30.0–36.0)
Neutro Abs: 7.1 10*3/uL (ref 1.4–7.7)
Platelets: 337 10*3/uL (ref 150.0–400.0)
RDW: 14.8 % — ABNORMAL HIGH (ref 11.5–14.6)

## 2011-08-14 LAB — POCT URINALYSIS DIPSTICK
Blood, UA: NEGATIVE
Ketones, UA: NEGATIVE
Protein, UA: NEGATIVE
Spec Grav, UA: 1.01
pH, UA: 6

## 2011-08-14 LAB — BASIC METABOLIC PANEL
BUN: 33 mg/dL — ABNORMAL HIGH (ref 6–23)
CO2: 27 mEq/L (ref 19–32)
Calcium: 9.9 mg/dL (ref 8.4–10.5)
Glucose, Bld: 124 mg/dL — ABNORMAL HIGH (ref 70–99)
Sodium: 140 mEq/L (ref 135–145)

## 2011-08-14 NOTE — Patient Instructions (Signed)
Continue your current medications  We will call you next week if there is any abnormalities in your lab work

## 2011-08-14 NOTE — Progress Notes (Signed)
  Subjective:    Patient ID: Adrienne [name], female    DOB: July 11, 1934, 77 y.o.   MRN: [id number]  HPI Adrienne [name] is a 77 year old female who comes in today following a hospitalization from April 4 to April 8 with a viral gastroenteritis and dehydration  She was hospitalized for IV fluids told in the hospital she had a urinary tract infection. However she had a urinary tract symptoms and in followup today her urinalysis is normal. She says she feels well except she is tired and fatigued. Her blood sugar at home is in the 120 to 1:30 range on metformin 500 mg twice a day. She also takes allopurinol once daily calcium vitamin D OTC   Review of Systems      general and GI review of systems otherwise negative Objective:   Physical Exam  Well-developed well-nourished female in no acute distress examination the abdomen is normal again the urinalysis normal      Assessment & Plan:  Viral gastroenteritis with dehydration plan recheck electrolytes and renal function continue current therapy

## 2011-09-30 ENCOUNTER — Telehealth: Payer: Self-pay | Admitting: Family Medicine

## 2011-09-30 NOTE — Telephone Encounter (Signed)
Spoke with patient.

## 2011-09-30 NOTE — Telephone Encounter (Signed)
Pulled from Triage vmail. States her flank is bruised and when she bent over she heard a "pop". Please call to advise. Thanks!

## 2011-09-30 NOTE — Telephone Encounter (Signed)
Patient was bent over scrubbing something something and "pop".  She now has a busied colored area on her left side, under her ribs, that is "sore inside".  Any suggestions?

## 2011-09-30 NOTE — Telephone Encounter (Signed)
Fleet Contras please call treatment is symptomatic ice and Motrin

## 2011-10-28 ENCOUNTER — Other Ambulatory Visit: Payer: Self-pay | Admitting: Family Medicine

## 2011-12-03 ENCOUNTER — Other Ambulatory Visit: Payer: Self-pay | Admitting: Family Medicine

## 2012-02-23 ENCOUNTER — Telehealth: Payer: Self-pay | Admitting: Family Medicine

## 2012-02-23 MED ORDER — GLUCOSE BLOOD VI STRP: 1.0000 | ORAL_STRIP | Freq: Two times a day (BID) | Status: DC

## 2012-02-23 NOTE — Telephone Encounter (Signed)
Caller: [name]; Patient Name: Adrienne [name]; PCP: Kelle Darting [facility]); Best Callback Phone Number: [phone]  Patient states she received a new glucose monitor, Accucheck Aviva. States current testing strips do not fit new monitor. Patient states she needs Accucheck Fast Clicks testing strips to fit her new glucose monitor. Patient tests up to BID.  RN spoke with Pharmacist/Chris at [facility], [place] @ [phone] regarding calling in Rx for Accucheck Fast clicks testing strips. Pharmacist/Chris states Rx for testing strips cannot be called into Pharmacy or [facility]. States hard copy or fax order must be received. States to include Diagnosis Code, frequency of testing and quantity with order.  PATIENT RECEIVED NEW GLUCOSE MONITOR AND NEEDS RX FOR ACCUCHECK FAST CLICKS TESTING STRIPS, INCLUDING DIAGNOSIS CODE, FREQUENCY OF TESTING AND QUANTITY FAXED TO [facility], [place] @ [phone]. PATIENT CAN BE REACHED AT [phone].

## 2012-02-24 NOTE — Telephone Encounter (Signed)
Hard copy faxed. 

## 2012-02-26 ENCOUNTER — Other Ambulatory Visit: Payer: Self-pay | Admitting: *Deleted

## 2012-02-26 MED ORDER — ACCU-CHEK FASTCLIX LANCETS MISC: 1.0000 | Freq: Every day | Status: DC

## 2012-03-09 ENCOUNTER — Other Ambulatory Visit: Payer: Self-pay | Admitting: Family Medicine

## 2012-06-12 ENCOUNTER — Other Ambulatory Visit: Payer: Self-pay | Admitting: Family Medicine

## 2012-06-23 ENCOUNTER — Other Ambulatory Visit: Payer: Self-pay | Admitting: Family Medicine

## 2012-08-12 ENCOUNTER — Encounter: Payer: Self-pay | Admitting: Family Medicine

## 2012-08-25 ENCOUNTER — Other Ambulatory Visit: Payer: Self-pay | Admitting: Radiology

## 2012-08-25 DIAGNOSIS — C50919 Malignant neoplasm of unspecified site of unspecified female breast: Secondary | ICD-10-CM

## 2012-08-25 HISTORY — DX: Malignant neoplasm of unspecified site of unspecified female breast: C50.919

## 2012-08-26 ENCOUNTER — Other Ambulatory Visit: Payer: Self-pay | Admitting: Radiology

## 2012-08-26 DIAGNOSIS — D0512 Intraductal carcinoma in situ of left breast: Secondary | ICD-10-CM

## 2012-08-29 ENCOUNTER — Ambulatory Visit
Admission: RE | Admit: 2012-08-29 | Discharge: 2012-08-29 | Disposition: A | Discharge status: Home / Self Care | Payer: 59 | Source: Ambulatory Visit | Attending: Radiology | Admitting: Radiology

## 2012-08-29 DIAGNOSIS — D0512 Intraductal carcinoma in situ of left breast: Secondary | ICD-10-CM

## 2012-08-29 MED ORDER — GADOBENATE DIMEGLUMINE 529 MG/ML IV SOLN
12.0000 mL | Freq: Once | INTRAVENOUS | Status: AC | PRN
  Administered 2012-08-29: 12 mL via INTRAVENOUS

## 2012-08-30 ENCOUNTER — Ambulatory Visit (INDEPENDENT_AMBULATORY_CARE_PROVIDER_SITE_OTHER): Payer: Medicare Other | Admitting: Surgery

## 2012-08-30 ENCOUNTER — Encounter (INDEPENDENT_AMBULATORY_CARE_PROVIDER_SITE_OTHER): Payer: Self-pay | Admitting: Surgery

## 2012-08-30 VITALS — BP 122/64 | HR 76 | Temp 97.8°F | Resp 18 | Ht 62.0 in | Wt 130.0 lb

## 2012-08-30 DIAGNOSIS — C50912 Malignant neoplasm of unspecified site of left female breast: Secondary | ICD-10-CM

## 2012-08-30 DIAGNOSIS — C50919 Malignant neoplasm of unspecified site of unspecified female breast: Secondary | ICD-10-CM

## 2012-08-30 NOTE — Progress Notes (Signed)
Patient ID: Adrienne [name], female   DOB: 12/19/1934, 78 y.o.   MRN: [id number]  Chief Complaint  Patient presents with  . Breast Cancer    Left    HPI Adrienne [name] is a 78 y.o. female.  She recently had a mammogram done an abnormality was found and confirmed by ultrasound. A needle core biopsy was done showing invasive ductal carcinoma with associated DCIS the calcifications. An MRI has been done showing a single lesion with no other lesion in the left breast nor any abnormality in the right breast. The patient was asymptomatic and is not aware of a palpable mass. Many years ago she had benign tumor removed from the right breast. This is a negative family history of breast cancer or ovarian cancer.  HPI  Past Medical History  Diagnosis Date  . Hypertension   . Hyperlipidemia   . Diabetes mellitus     Past Surgical History  Procedure Laterality Date  . Appendectomy    . Cholecystectomy    . Total abdominal hysterectomy w/ bilateral salpingoophorectomy    . Breast surgery      Family History  Problem Relation Age of Onset  . Hypertension Mother   . Coronary artery disease Mother   . Hypertension Father     Social History History  Substance Use Topics  . Smoking status: Never Smoker   . Smokeless tobacco: Not on file  . Alcohol Use: No    Allergies  Allergen Reactions  . Penicillins Hives and Swelling    Current Outpatient Prescriptions  Medication Sig Dispense Refill  . ACCU-CHEK FASTCLIX LANCETS MISC 1 each by Does not apply route daily.  102 each  3  . allopurinol (ZYLOPRIM) 300 MG tablet TAKE ONE TABLET BY MOUTH ONE TIME DAILY  40 tablet  2  . atenolol-chlorthalidone (TENORETIC) 50-25 MG per tablet TAKE ONE-HALF TABLET BY MOUTH IN THE MORNING  20 tablet  2  . calcium-vitamin D 250-100 MG-UNIT per tablet Take 1 tablet by mouth 2 (two) times daily.      Marland Kitchen conjugated estrogens (PREMARIN) vaginal cream Place vaginally daily.  42.5 g  11  . fish oil-omega-3 fatty  acids 1000 MG capsule Take 1 g by mouth daily.      Marland Kitchen glucose blood (ACCU-CHEK AVIVA) test strip 1 each by Other route 2 (two) times daily. Dx 250.00  100 each  12  . metFORMIN (GLUCOPHAGE) 500 MG tablet Take 1 tablet (500 mg total) by mouth 2 (two) times daily with a meal.  200 tablet  3  . metFORMIN (GLUCOPHAGE) 500 MG tablet TAKE ONE TABLET BY MOUTH TWICE DAILY  180 tablet  3  . Multiple Vitamins-Minerals (CENTRUM SILVER) tablet Take 1 tablet by mouth daily.        . Multiple Vitamins-Minerals (EYE VITAMINS) CAPS Take by mouth.      . potassium chloride SA (K-DUR,KLOR-CON) 20 MEQ tablet TAKE ONE TABLET BY MOUTH TWICE DAILY  80 tablet  2  . simvastatin (ZOCOR) 40 MG tablet TAKE ONE TABLET BY MOUTH AT BEDTIME  90 tablet  2  . HYDROcodone-homatropine (HYCODAN) 5-1.5 MG/5ML syrup One half to 1 teaspoon 3 times daily when necessary for flu symptoms  120 mL  1   No current facility-administered medications for this visit.    Review of Systems Review of Systems  Constitutional: Negative for fever, chills and unexpected weight change.  HENT: Positive for hearing loss. Negative for congestion, sore throat, trouble swallowing and voice change.  Eyes: Negative for visual disturbance.  Respiratory: Negative for cough and wheezing.   Cardiovascular: Negative for chest pain, palpitations and leg swelling.  Gastrointestinal: Negative for nausea, vomiting, abdominal pain, diarrhea, constipation, blood in stool, abdominal distention and anal bleeding.  Genitourinary: Negative for hematuria, vaginal bleeding and difficulty urinating.  Musculoskeletal: Negative for arthralgias.  Skin: Negative for rash and wound.  Neurological: Negative for seizures, syncope and headaches.  Hematological: Negative for adenopathy. Does not bruise/bleed easily.  Psychiatric/Behavioral: Negative for confusion.    Blood pressure 122/64, pulse 76, temperature 97.8 F (36.6 C), resp. rate 18, height 5\' 2"  (1.575 m), weight  130 lb (58.968 kg).  Physical Exam Physical Exam  Pulmonary/Chest: Right breast exhibits no inverted nipple, no mass, no nipple discharge, no skin change and no tenderness. Left breast exhibits inverted nipple and mass. Left breast exhibits no nipple discharge, no skin change and no tenderness. Breasts are symmetrical.    Abdominal: Distention: Suregical scars.    Surgical scars  Lymphadenopathy:    She has no cervical adenopathy.    She has no axillary adenopathy.       Right: No supraclavicular adenopathy present.       Left: No supraclavicular adenopathy present.    Data Reviewed I reviewed the mammogram reports and films and a pathology report  Assessment    Clinical stage I invasive ductal carcinoma left breast, upper outer quadrant, prognostic panel pending     Plan    I have explained the pathophysiology and staging of breast cancer with particular attention to her exact situation. We discussed the multidisciplinary approach to breast cancer which often includes both medical and radiation oncology consultations.  We also discussed surgical options for the treatment of breast cancer including lumpectomy and mastectomy with possible reconstructive surgery. In addition we talked about the evaluation and management of lymph nodes including a description of sentinel lymph node biopsy and axillary dissections. We reviewed potential complications and risks including bleeding, infection, numbness,  lymphedema, and the potential need for additional surgery.  She understands that for patients who are candidate for lumpectomy or mastectomy there is an equal survival rate with either technique, but a slightly higher local recurrence rate with lumpectomy. In addition she knows that a lumpectomy usually requires postoperative radiation as part of the management of the breast cancer.  We have discussed the likely postoperative course and plans for followup.  I have given the patient some  written information that reviewed all of these issues. I believe her questions are answered and that she has a good understanding of the issues.  I think she would be a candidate for a lumpectomy and sentinel node evaluation. This is what she would like to schedule. Although the mass is probably palpable I am not sure this is exact location of the tumor so we will use a wire localized technique for lumpectomy.  I have discussed the indications for the lumpectomy and described the procedure. She understand that the chance of removal of the abnormal area is very good, but that occasionally we are unable to locate it and may have to do a second procedure. We also discussed the possibility of a second procedure to get additional tissue. Risks of surgery such as bleeding and infection have also been explained, as well as the implications of not doing the surgery. She understands and wishes to proceed.         Newel Oien J 08/30/2012, 4:28 PM

## 2012-08-30 NOTE — Patient Instructions (Addendum)
We will schedule surgery to remove the small breast cancer in your left breast

## 2012-08-31 ENCOUNTER — Other Ambulatory Visit (INDEPENDENT_AMBULATORY_CARE_PROVIDER_SITE_OTHER): Payer: Self-pay | Admitting: Surgery

## 2012-08-31 DIAGNOSIS — C50912 Malignant neoplasm of unspecified site of left female breast: Secondary | ICD-10-CM

## 2012-09-05 ENCOUNTER — Encounter (HOSPITAL_COMMUNITY): Payer: Self-pay | Admitting: Pharmacy Technician

## 2012-09-06 ENCOUNTER — Telehealth (INDEPENDENT_AMBULATORY_CARE_PROVIDER_SITE_OTHER): Payer: Self-pay | Admitting: General Surgery

## 2012-09-06 NOTE — Telephone Encounter (Signed)
Message copied by Liliana Cline on Tue Sep 06, 2012 11:29 AM ------      Message from: Marnette Burgess      Created: Tue Sep 06, 2012 11:20 AM      Contact: [phone]       Calling for test results, Dr. Jillyn Hidden said he would call her when he didn't have the results on her last office visit, please call. ------

## 2012-09-06 NOTE — Telephone Encounter (Signed)
Called patient and made her aware her results are all in. Made her aware she is ER/PR positive and explained about an estrogen blocker that she will discuss with the oncologist. I also made her aware her HER2 was negative and her Ki 67 was at 16%. She will call with any other questions.

## 2012-09-08 ENCOUNTER — Encounter (HOSPITAL_COMMUNITY): Payer: Self-pay

## 2012-09-08 ENCOUNTER — Ambulatory Visit (HOSPITAL_COMMUNITY)
Admission: RE | Admit: 2012-09-08 | Discharge: 2012-09-08 | Disposition: A | Discharge status: Home / Self Care | Payer: Medicare Other | Source: Ambulatory Visit | Attending: Surgery | Admitting: Surgery

## 2012-09-08 ENCOUNTER — Encounter (HOSPITAL_COMMUNITY)
Admission: RE | Admit: 2012-09-08 | Discharge: 2012-09-08 | Disposition: A | Discharge status: Home / Self Care | Payer: Medicare Other | Source: Ambulatory Visit | Attending: Surgery | Admitting: Surgery

## 2012-09-08 DIAGNOSIS — Z01812 Encounter for preprocedural laboratory examination: Secondary | ICD-10-CM | POA: Insufficient documentation

## 2012-09-08 DIAGNOSIS — Z01810 Encounter for preprocedural cardiovascular examination: Secondary | ICD-10-CM | POA: Insufficient documentation

## 2012-09-08 DIAGNOSIS — C50919 Malignant neoplasm of unspecified site of unspecified female breast: Secondary | ICD-10-CM | POA: Insufficient documentation

## 2012-09-08 DIAGNOSIS — Z01818 Encounter for other preprocedural examination: Secondary | ICD-10-CM | POA: Insufficient documentation

## 2012-09-08 HISTORY — DX: Personal history of urinary (tract) infections: Z87.440

## 2012-09-08 HISTORY — DX: Malignant neoplasm of unspecified site of unspecified female breast: C50.919

## 2012-09-08 LAB — COMPREHENSIVE METABOLIC PANEL
AST: 25 U/L (ref 0–37)
CO2: 30 mEq/L (ref 19–32)
Calcium: 10.5 mg/dL (ref 8.4–10.5)
Creatinine, Ser: 1.03 mg/dL (ref 0.50–1.10)
GFR calc Af Amer: 60 mL/min — ABNORMAL LOW (ref >90)
GFR calc non Af Amer: 52 mL/min — ABNORMAL LOW (ref >90)

## 2012-09-08 LAB — CBC WITH DIFFERENTIAL/PLATELET
Basophils Absolute: 0 10*3/uL (ref 0.0–0.1)
Eosinophils Relative: 1 % (ref 0–5)
HCT: 41.2 % (ref 36.0–46.0)
Lymphocytes Relative: 26 % (ref 12–46)
MCHC: 34.5 g/dL (ref 30.0–36.0)
MCV: 90.2 fL (ref 78.0–100.0)
Monocytes Absolute: 1.1 10*3/uL — ABNORMAL HIGH (ref 0.1–1.0)
RDW: 13.9 % (ref 11.5–15.5)

## 2012-09-08 LAB — URINALYSIS, ROUTINE W REFLEX MICROSCOPIC
Hgb urine dipstick: NEGATIVE
Leukocytes, UA: NEGATIVE
Nitrite: NEGATIVE
Specific Gravity, Urine: 1.016 (ref 1.005–1.030)
Urobilinogen, UA: 1 mg/dL (ref 0.0–1.0)

## 2012-09-08 NOTE — Pre-Procedure Instructions (Signed)
Adrienne [name]  09/08/2012   Your procedure is scheduled on:  May 12  Report to [facility] at Immediately after breast center appt. BRING XRay WITH YOU to Short Stay  Call this number if you have problems the morning of surgery: [phone]   Remember:   Do not eat food or drink liquids after midnight.   Take these medicines the morning of surgery with A SIP OF WATER: Tenoretic,    STOP Calcium, Fish Oil, Aspirin, Multiple Vitamins today  Do not wear jewelry, make-up or nail polish.  Do not wear lotions, powders, or perfumes. You may wear deodorant.  Do not shave 48 hours prior to surgery. Men may shave face and neck.  Do not bring valuables to the hospital.  Contacts, dentures or bridgework may not be worn into surgery.  Leave suitcase in the car. After surgery it may be brought to your room.  For patients admitted to the hospital, checkout time is 11:00 AM the day of discharge.   Patients discharged the day of surgery will not be allowed to drive home.  Name and phone number of your driver: Family/ Friend  Special Instructions: Shower using CHG 2 nights before surgery and the night before surgery.  If you shower the day of surgery use CHG.  Use special wash - you have one bottle of CHG for all showers.  You should use approximately 1/3 of the bottle for each shower.   Please read over the following fact sheets that you were given: Pain Booklet, Coughing and Deep Breathing and Surgical Site Infection Prevention

## 2012-09-11 MED ORDER — CIPROFLOXACIN IN D5W 400 MG/200ML IV SOLN
400.0000 mg | INTRAVENOUS | Status: AC
  Administered 2012-09-12: 400 mg via INTRAVENOUS
  Filled 2012-09-11: qty 200

## 2012-09-12 ENCOUNTER — Encounter (HOSPITAL_COMMUNITY)
Admission: RE | Disposition: A | Discharge status: Home / Self Care | Payer: Self-pay | Source: Ambulatory Visit | Attending: General Surgery

## 2012-09-12 ENCOUNTER — Encounter (HOSPITAL_COMMUNITY): Payer: Self-pay | Admitting: Anesthesiology

## 2012-09-12 ENCOUNTER — Ambulatory Visit (HOSPITAL_COMMUNITY): Payer: Medicare Other | Admitting: Anesthesiology

## 2012-09-12 ENCOUNTER — Encounter (HOSPITAL_COMMUNITY)
Admission: RE | Admit: 2012-09-12 | Discharge: 2012-09-12 | Disposition: A | Discharge status: Home / Self Care | Payer: Medicare Other | Source: Ambulatory Visit | Attending: General Surgery | Admitting: General Surgery

## 2012-09-12 ENCOUNTER — Ambulatory Visit (HOSPITAL_COMMUNITY)
Admission: RE | Admit: 2012-09-12 | Discharge: 2012-09-12 | Disposition: A | Discharge status: Home / Self Care | Payer: Medicare Other | Source: Ambulatory Visit | Attending: General Surgery | Admitting: General Surgery

## 2012-09-12 ENCOUNTER — Other Ambulatory Visit: Payer: Self-pay | Admitting: *Deleted

## 2012-09-12 DIAGNOSIS — D0590 Unspecified type of carcinoma in situ of unspecified breast: Secondary | ICD-10-CM | POA: Insufficient documentation

## 2012-09-12 DIAGNOSIS — E119 Type 2 diabetes mellitus without complications: Secondary | ICD-10-CM | POA: Insufficient documentation

## 2012-09-12 DIAGNOSIS — E785 Hyperlipidemia, unspecified: Secondary | ICD-10-CM | POA: Insufficient documentation

## 2012-09-12 DIAGNOSIS — C50912 Malignant neoplasm of unspecified site of left female breast: Secondary | ICD-10-CM

## 2012-09-12 DIAGNOSIS — I10 Essential (primary) hypertension: Secondary | ICD-10-CM | POA: Insufficient documentation

## 2012-09-12 DIAGNOSIS — C50419 Malignant neoplasm of upper-outer quadrant of unspecified female breast: Secondary | ICD-10-CM | POA: Insufficient documentation

## 2012-09-12 DIAGNOSIS — Z880 Allergy status to penicillin: Secondary | ICD-10-CM | POA: Insufficient documentation

## 2012-09-12 DIAGNOSIS — Z79899 Other long term (current) drug therapy: Secondary | ICD-10-CM | POA: Insufficient documentation

## 2012-09-12 DIAGNOSIS — C50919 Malignant neoplasm of unspecified site of unspecified female breast: Secondary | ICD-10-CM | POA: Insufficient documentation

## 2012-09-12 DIAGNOSIS — Z170 Estrogen receptor positive status [ER+]: Secondary | ICD-10-CM | POA: Insufficient documentation

## 2012-09-12 HISTORY — PX: BREAST LUMPECTOMY WITH NEEDLE LOCALIZATION AND AXILLARY SENTINEL LYMPH NODE BX: SHX5760

## 2012-09-12 LAB — GLUCOSE, CAPILLARY: Glucose-Capillary: 160 mg/dL — ABNORMAL HIGH (ref 70–99)

## 2012-09-12 SURGERY — BREAST LUMPECTOMY WITH NEEDLE LOCALIZATION AND AXILLARY SENTINEL LYMPH NODE BX
Anesthesia: General | Laterality: Left | Wound class: Clean

## 2012-09-12 MED ORDER — LIDOCAINE HCL (CARDIAC) 20 MG/ML IV SOLN
INTRAVENOUS | Status: DC | PRN
  Administered 2012-09-12: 40 mg via INTRAVENOUS

## 2012-09-12 MED ORDER — HYDROMORPHONE HCL PF 1 MG/ML IJ SOLN
INTRAMUSCULAR | Status: AC
  Filled 2012-09-12: qty 1

## 2012-09-12 MED ORDER — LACTATED RINGERS IV SOLN
INTRAVENOUS | Status: DC | PRN
  Administered 2012-09-12: 10:00:00 via INTRAVENOUS

## 2012-09-12 MED ORDER — HYDROCODONE-ACETAMINOPHEN 5-325 MG PO TABS: 1.0000 | ORAL_TABLET | ORAL | Status: DC | PRN

## 2012-09-12 MED ORDER — GLUCOSE BLOOD VI STRP: 1.0000 | ORAL_STRIP | Freq: Two times a day (BID) | Status: DC

## 2012-09-12 MED ORDER — BUPIVACAINE-EPINEPHRINE PF 0.5-1:200000 % IJ SOLN
INTRAMUSCULAR | Status: AC
  Filled 2012-09-12: qty 30

## 2012-09-12 MED ORDER — FENTANYL CITRATE 0.05 MG/ML IJ SOLN
INTRAMUSCULAR | Status: AC
  Filled 2012-09-12: qty 2

## 2012-09-12 MED ORDER — BUPIVACAINE-EPINEPHRINE 0.5% -1:200000 IJ SOLN
INTRAMUSCULAR | Status: DC | PRN
  Administered 2012-09-12: 20 mL

## 2012-09-12 MED ORDER — EPHEDRINE SULFATE 50 MG/ML IJ SOLN
INTRAMUSCULAR | Status: DC | PRN
  Administered 2012-09-12 (×2): 5 mg via INTRAVENOUS

## 2012-09-12 MED ORDER — HYDROMORPHONE HCL PF 1 MG/ML IJ SOLN
0.2500 mg | INTRAMUSCULAR | Status: DC | PRN
  Administered 2012-09-12: 0.25 mg via INTRAVENOUS
  Administered 2012-09-12: 0.5 mg via INTRAVENOUS

## 2012-09-12 MED ORDER — ONDANSETRON HCL 4 MG/2ML IJ SOLN
INTRAMUSCULAR | Status: DC | PRN
  Administered 2012-09-12: 4 mg via INTRAVENOUS

## 2012-09-12 MED ORDER — METHYLENE BLUE 1 % INJ SOLN
INTRAMUSCULAR | Status: AC
  Filled 2012-09-12: qty 10

## 2012-09-12 MED ORDER — LACTATED RINGERS IV SOLN: INTRAVENOUS | Status: DC

## 2012-09-12 MED ORDER — FENTANYL CITRATE 0.05 MG/ML IJ SOLN: 50.0000 ug | Freq: Once | INTRAMUSCULAR | Status: DC

## 2012-09-12 MED ORDER — ONDANSETRON HCL 4 MG/2ML IJ SOLN: 4.0000 mg | Freq: Once | INTRAMUSCULAR | Status: DC | PRN

## 2012-09-12 MED ORDER — CHLORHEXIDINE GLUCONATE 4 % EX LIQD: 1.0000 "application " | Freq: Once | CUTANEOUS | Status: DC

## 2012-09-12 MED ORDER — SODIUM CHLORIDE 0.9 % IJ SOLN
INTRAMUSCULAR | Status: DC | PRN
  Administered 2012-09-12: 11:00:00 via SUBCUTANEOUS

## 2012-09-12 MED ORDER — FENTANYL CITRATE 0.05 MG/ML IJ SOLN
INTRAMUSCULAR | Status: DC | PRN
  Administered 2012-09-12 (×2): 50 ug via INTRAVENOUS

## 2012-09-12 MED ORDER — TECHNETIUM TC 99M SULFUR COLLOID FILTERED
1.0000 | Freq: Once | INTRAVENOUS | Status: AC | PRN
  Administered 2012-09-12: 1 via INTRADERMAL

## 2012-09-12 MED ORDER — PROPOFOL 10 MG/ML IV BOLUS
INTRAVENOUS | Status: DC | PRN
  Administered 2012-09-12: 20 mg via INTRAVENOUS
  Administered 2012-09-12: 150 mg via INTRAVENOUS

## 2012-09-12 MED ORDER — 0.9 % SODIUM CHLORIDE (POUR BTL) OPTIME
TOPICAL | Status: DC | PRN
  Administered 2012-09-12: 1000 mL

## 2012-09-12 MED ORDER — DEXTROSE 5 % IV SOLN
INTRAVENOUS | Status: DC | PRN
  Administered 2012-09-12: 10:00:00 via INTRAVENOUS

## 2012-09-12 MED ORDER — ARTIFICIAL TEARS OP OINT
TOPICAL_OINTMENT | OPHTHALMIC | Status: DC | PRN
  Administered 2012-09-12: 1 via OPHTHALMIC

## 2012-09-12 SURGICAL SUPPLY — 54 items
ADH SKN CLS APL DERMABOND .7 (GAUZE/BANDAGES/DRESSINGS) ×1
BINDER BREAST LRG (GAUZE/BANDAGES/DRESSINGS) IMPLANT
BINDER BREAST XLRG (GAUZE/BANDAGES/DRESSINGS) IMPLANT
BLADE SURG 10 STRL SS (BLADE) ×2 IMPLANT
BLADE SURG 15 STRL LF DISP TIS (BLADE) ×1 IMPLANT
BLADE SURG 15 STRL SS (BLADE) ×2
CANISTER SUCTION 2500CC (MISCELLANEOUS) ×2 IMPLANT
CHLORAPREP W/TINT 26ML (MISCELLANEOUS) ×2 IMPLANT
CLIP TI MEDIUM 6 (CLIP) ×2 IMPLANT
CLOTH BEACON ORANGE TIMEOUT ST (SAFETY) ×2 IMPLANT
CONT SPEC STER OR (MISCELLANEOUS) ×2 IMPLANT
COVER PROBE W GEL 5X96 (DRAPES) ×2 IMPLANT
COVER SURGICAL LIGHT HANDLE (MISCELLANEOUS) ×2 IMPLANT
DERMABOND ADVANCED (GAUZE/BANDAGES/DRESSINGS) ×1
DERMABOND ADVANCED .7 DNX12 (GAUZE/BANDAGES/DRESSINGS) ×1 IMPLANT
DEVICE DUBIN SPECIMEN MAMMOGRA (MISCELLANEOUS) ×2 IMPLANT
DRAPE CHEST BREAST 15X10 FENES (DRAPES) ×2 IMPLANT
DRAPE UTILITY 15X26 W/TAPE STR (DRAPE) ×4 IMPLANT
ELECT COATED BLADE 2.86 ST (ELECTRODE) ×2 IMPLANT
ELECT REM PT RETURN 9FT ADLT (ELECTROSURGICAL) ×2
ELECTRODE REM PT RTRN 9FT ADLT (ELECTROSURGICAL) ×1 IMPLANT
GLOVE BIO SURGEON STRL SZ7 (GLOVE) ×1 IMPLANT
GLOVE BIO SURGEON STRL SZ7.5 (GLOVE) ×1 IMPLANT
GLOVE BIOGEL PI IND STRL 7.0 (GLOVE) IMPLANT
GLOVE BIOGEL PI IND STRL 7.5 (GLOVE) IMPLANT
GLOVE BIOGEL PI IND STRL 8 (GLOVE) ×1 IMPLANT
GLOVE BIOGEL PI INDICATOR 7.0 (GLOVE) ×2
GLOVE BIOGEL PI INDICATOR 7.5 (GLOVE) ×1
GLOVE BIOGEL PI INDICATOR 8 (GLOVE) ×1
GLOVE SS BIOGEL STRL SZ 7.5 (GLOVE) ×2 IMPLANT
GLOVE SUPERSENSE BIOGEL SZ 7.5 (GLOVE) ×2
GOWN PREVENTION PLUS XLARGE (GOWN DISPOSABLE) ×2 IMPLANT
GOWN STRL NON-REIN LRG LVL3 (GOWN DISPOSABLE) ×3 IMPLANT
KIT BASIN OR (CUSTOM PROCEDURE TRAY) ×2 IMPLANT
KIT MARKER MARGIN INK (KITS) ×2 IMPLANT
KIT ROOM TURNOVER OR (KITS) ×2 IMPLANT
NDL 18GX1X1/2 (RX/OR ONLY) (NEEDLE) ×1 IMPLANT
NDL HYPO 25GX1X1/2 BEV (NEEDLE) ×2 IMPLANT
NEEDLE 18GX1X1/2 (RX/OR ONLY) (NEEDLE) ×2 IMPLANT
NEEDLE HYPO 25GX1X1/2 BEV (NEEDLE) ×4 IMPLANT
NS IRRIG 1000ML POUR BTL (IV SOLUTION) ×2 IMPLANT
PACK SURGICAL SETUP 50X90 (CUSTOM PROCEDURE TRAY) ×2 IMPLANT
PAD ARMBOARD 7.5X6 YLW CONV (MISCELLANEOUS) ×2 IMPLANT
PENCIL BUTTON HOLSTER BLD 10FT (ELECTRODE) ×2 IMPLANT
SPONGE LAP 18X18 X RAY DECT (DISPOSABLE) ×2 IMPLANT
SUT MON AB 5-0 PS2 18 (SUTURE) ×4 IMPLANT
SUT VIC AB 3-0 SH 27 (SUTURE) ×4
SUT VIC AB 3-0 SH 27XBRD (SUTURE) ×2 IMPLANT
SYR BULB 3OZ (MISCELLANEOUS) ×2 IMPLANT
SYR CONTROL 10ML LL (SYRINGE) ×4 IMPLANT
TOWEL OR 17X24 6PK STRL BLUE (TOWEL DISPOSABLE) ×2 IMPLANT
TOWEL OR 17X26 10 PK STRL BLUE (TOWEL DISPOSABLE) ×2 IMPLANT
TUBE CONNECTING 12X1/4 (SUCTIONS) ×2 IMPLANT
YANKAUER SUCT BULB TIP NO VENT (SUCTIONS) ×2 IMPLANT

## 2012-09-12 NOTE — Transfer of Care (Signed)
Immediate Anesthesia Transfer of Care Note  Patient: Adrienne [name]  Procedure(s) Performed: Procedure(s): LEFT NEEDLE LOCALIZATION BREAST LUMPECTOMY TION AND AXILLARY SENTINEL LYMPH NODE BX (Left)  Patient Location: PACU  Anesthesia Type:General  Level of Consciousness: sedated and patient cooperative  Airway & Oxygen Therapy: Patient Spontanous Breathing and Patient connected to nasal cannula oxygen  Post-op Assessment: Report given to PACU RN and Post -op Vital signs reviewed and stable  Post vital signs: Reviewed and stable  Complications: No apparent anesthesia complications

## 2012-09-12 NOTE — Interval H&P Note (Signed)
History and Physical Interval Note:  09/12/2012 9:47 AM  Adrienne [name]  has presented today for surgery, with the diagnosis of left breast cancer  The various methods of treatment have been discussed with the patient and family. After consideration of risks, benefits and other options for treatment, the patient has consented to  Procedure(s): LEFT NEEDLE LOCALIZATION BREAST LUMPECTOMY TION AND AXILLARY SENTINEL LYMPH NODE BX (Left) as a surgical intervention .  The patient's history has been reviewed, patient examined, no change in status, stable for surgery.  I have reviewed the patient's chart and labs.  Questions were answered to the patient's satisfaction.     Alva Broxson T

## 2012-09-12 NOTE — Anesthesia Postprocedure Evaluation (Signed)
  Anesthesia Post-op Note  Patient: Adrienne [name]  Procedure(s) Performed: Procedure(s): LEFT NEEDLE LOCALIZATION BREAST LUMPECTOMY TION AND AXILLARY SENTINEL LYMPH NODE BX (Left)  Patient Location: PACU  Anesthesia Type:General  Level of Consciousness: awake, alert , oriented and patient cooperative  Airway and Oxygen Therapy: Patient Spontanous Breathing  Post-op Pain: mild  Post-op Assessment: Post-op Vital signs reviewed, Patient's Cardiovascular Status Stable, Respiratory Function Stable, Patent Airway, No signs of Nausea or vomiting and Pain level controlled  Post-op Vital Signs: stable  Complications: No apparent anesthesia complications

## 2012-09-12 NOTE — Preoperative (Signed)
Beta Blockers   Reason not to administer Beta Blockers:Atenolol/Chlorthalidone 0630 today

## 2012-09-12 NOTE — Anesthesia Preprocedure Evaluation (Addendum)
Anesthesia Evaluation  Patient identified by MRN, date of birth, ID band Patient awake    Reviewed: Allergy & Precautions, H&P , NPO status , Patient's Chart, lab work & pertinent test results, reviewed documented beta blocker date and time   Airway Mallampati: II TM Distance: >3 FB Neck ROM: Full    Dental  (+) Partial Upper, Dental Advisory Given and Teeth Intact   Pulmonary former smoker,          Cardiovascular hypertension, Pt. on home beta blockers     Neuro/Psych    GI/Hepatic   Endo/Other  diabetes, Well Controlled, Type 2, Oral Hypoglycemic Agents  Renal/GU Renal InsufficiencyRenal disease     Musculoskeletal   Abdominal   Peds  Hematology   Anesthesia Other Findings   Reproductive/Obstetrics                         Anesthesia Physical Anesthesia Plan  ASA: III  Anesthesia Plan: General   Post-op Pain Management:    Induction:   Airway Management Planned: LMA and Oral ETT  Additional Equipment:   Intra-op Plan:   Post-operative Plan: Extubation in OR  Informed Consent: I have reviewed the patients History and Physical, chart, labs and discussed the procedure including the risks, benefits and alternatives for the proposed anesthesia with the patient or authorized representative who has indicated his/her understanding and acceptance.     Plan Discussed with: CRNA, Anesthesiologist and Surgeon  Anesthesia Plan Comments:         Anesthesia Quick Evaluation

## 2012-09-12 NOTE — Anesthesia Procedure Notes (Signed)
Procedure Name: LMA Insertion Date/Time: 09/12/2012 10:19 AM Performed by: Darcey Nora B Pre-anesthesia Checklist: Patient identified, Emergency Drugs available, Suction available and Patient being monitored Patient Re-evaluated:Patient Re-evaluated prior to inductionOxygen Delivery Method: Circle system utilized Preoxygenation: Pre-oxygenation with 100% oxygen Intubation Type: IV induction Ventilation: Mask ventilation without difficulty LMA: LMA inserted LMA Size: 4.0 Number of attempts: 1 Placement Confirmation: positive ETCO2 and breath sounds checked- equal and bilateral Tube secured with: taped to cheeks. Dental Injury: Teeth and Oropharynx as per pre-operative assessment

## 2012-09-12 NOTE — H&P (View-Only) (Signed)
Patient ID: Adrienne [name], female   DOB: 04/14/1935, 77 y.o.   MRN: [id number]  Chief Complaint  Patient presents with  . Breast Cancer    Left    HPI Adrienne [name] is a 77 y.o. female.  She recently had a mammogram done an abnormality was found and confirmed by ultrasound. A needle core biopsy was done showing invasive ductal carcinoma with associated DCIS the calcifications. An MRI has been done showing a single lesion with no other lesion in the left breast nor any abnormality in the right breast. The patient was asymptomatic and is not aware of a palpable mass. Many years ago she had benign tumor removed from the right breast. This is a negative family history of breast cancer or ovarian cancer.  HPI  Past Medical History  Diagnosis Date  . Hypertension   . Hyperlipidemia   . Diabetes mellitus     Past Surgical History  Procedure Laterality Date  . Appendectomy    . Cholecystectomy    . Total abdominal hysterectomy w/ bilateral salpingoophorectomy    . Breast surgery      Family History  Problem Relation Age of Onset  . Hypertension Mother   . Coronary artery disease Mother   . Hypertension Father     Social History History  Substance Use Topics  . Smoking status: Never Smoker   . Smokeless tobacco: Not on file  . Alcohol Use: No    Allergies  Allergen Reactions  . Penicillins Hives and Swelling    Current Outpatient Prescriptions  Medication Sig Dispense Refill  . ACCU-CHEK FASTCLIX LANCETS MISC 1 each by Does not apply route daily.  102 each  3  . allopurinol (ZYLOPRIM) 300 MG tablet TAKE ONE TABLET BY MOUTH ONE TIME DAILY  40 tablet  2  . atenolol-chlorthalidone (TENORETIC) 50-25 MG per tablet TAKE ONE-HALF TABLET BY MOUTH IN THE MORNING  20 tablet  2  . calcium-vitamin D 250-100 MG-UNIT per tablet Take 1 tablet by mouth 2 (two) times daily.      . conjugated estrogens (PREMARIN) vaginal cream Place vaginally daily.  42.5 g  11  . fish oil-omega-3 fatty  acids 1000 MG capsule Take 1 g by mouth daily.      . glucose blood (ACCU-CHEK AVIVA) test strip 1 each by Other route 2 (two) times daily. Dx 250.00  100 each  12  . metFORMIN (GLUCOPHAGE) 500 MG tablet Take 1 tablet (500 mg total) by mouth 2 (two) times daily with a meal.  200 tablet  3  . metFORMIN (GLUCOPHAGE) 500 MG tablet TAKE ONE TABLET BY MOUTH TWICE DAILY  180 tablet  3  . Multiple Vitamins-Minerals (CENTRUM SILVER) tablet Take 1 tablet by mouth daily.        . Multiple Vitamins-Minerals (EYE VITAMINS) CAPS Take by mouth.      . potassium chloride SA (K-DUR,KLOR-CON) 20 MEQ tablet TAKE ONE TABLET BY MOUTH TWICE DAILY  80 tablet  2  . simvastatin (ZOCOR) 40 MG tablet TAKE ONE TABLET BY MOUTH AT BEDTIME  90 tablet  2  . HYDROcodone-homatropine (HYCODAN) 5-1.5 MG/5ML syrup One half to 1 teaspoon 3 times daily when necessary for flu symptoms  120 mL  1   No current facility-administered medications for this visit.    Review of Systems Review of Systems  Constitutional: Negative for fever, chills and unexpected weight change.  HENT: Positive for hearing loss. Negative for congestion, sore throat, trouble swallowing and voice change.     Eyes: Negative for visual disturbance.  Respiratory: Negative for cough and wheezing.   Cardiovascular: Negative for chest pain, palpitations and leg swelling.  Gastrointestinal: Negative for nausea, vomiting, abdominal pain, diarrhea, constipation, blood in stool, abdominal distention and anal bleeding.  Genitourinary: Negative for hematuria, vaginal bleeding and difficulty urinating.  Musculoskeletal: Negative for arthralgias.  Skin: Negative for rash and wound.  Neurological: Negative for seizures, syncope and headaches.  Hematological: Negative for adenopathy. Does not bruise/bleed easily.  Psychiatric/Behavioral: Negative for confusion.    Blood pressure 122/64, pulse 76, temperature 97.8 F (36.6 C), resp. rate 18, height 5' 2" (1.575 m), weight  130 lb (58.968 kg).  Physical Exam Physical Exam  Pulmonary/Chest: Right breast exhibits no inverted nipple, no mass, no nipple discharge, no skin change and no tenderness. Left breast exhibits inverted nipple and mass. Left breast exhibits no nipple discharge, no skin change and no tenderness. Breasts are symmetrical.    Abdominal: Distention: Suregical scars.    Surgical scars  Lymphadenopathy:    She has no cervical adenopathy.    She has no axillary adenopathy.       Right: No supraclavicular adenopathy present.       Left: No supraclavicular adenopathy present.    Data Reviewed I reviewed the mammogram reports and films and a pathology report  Assessment    Clinical stage I invasive ductal carcinoma left breast, upper outer quadrant, prognostic panel pending     Plan    I have explained the pathophysiology and staging of breast cancer with particular attention to her exact situation. We discussed the multidisciplinary approach to breast cancer which often includes both medical and radiation oncology consultations.  We also discussed surgical options for the treatment of breast cancer including lumpectomy and mastectomy with possible reconstructive surgery. In addition we talked about the evaluation and management of lymph nodes including a description of sentinel lymph node biopsy and axillary dissections. We reviewed potential complications and risks including bleeding, infection, numbness,  lymphedema, and the potential need for additional surgery.  She understands that for patients who are candidate for lumpectomy or mastectomy there is an equal survival rate with either technique, but a slightly higher local recurrence rate with lumpectomy. In addition she knows that a lumpectomy usually requires postoperative radiation as part of the management of the breast cancer.  We have discussed the likely postoperative course and plans for followup.  I have given the patient some  written information that reviewed all of these issues. I believe her questions are answered and that she has a good understanding of the issues.  I think she would be a candidate for a lumpectomy and sentinel node evaluation. This is what she would like to schedule. Although the mass is probably palpable I am not sure this is exact location of the tumor so we will use a wire localized technique for lumpectomy.  I have discussed the indications for the lumpectomy and described the procedure. She understand that the chance of removal of the abnormal area is very good, but that occasionally we are unable to locate it and may have to do a second procedure. We also discussed the possibility of a second procedure to get additional tissue. Risks of surgery such as bleeding and infection have also been explained, as well as the implications of not doing the surgery. She understands and wishes to proceed.         Adrienne [name] 08/30/2012, 4:28 PM    

## 2012-09-12 NOTE — Op Note (Signed)
Preoperative Diagnosis: left breast cancer  Postoprative Diagnosis: left breast cancer  Procedure: Procedure(s): LEFT NEEDLE LOCALIZATION BREAST LUMPECTOMY,  blue dye injection AND AXILLARY SENTINEL LYMPH NODE BX   Surgeon: Glenna Fellows T   Assistants: None  Anesthesia:  General LMA anesthesiaDiagnos  Indications:   Patient is a 78 year old female with a recent diagnosis of invasive and in situ ductal cancer of the upper central left breast. It measures 1.2 cm by largest measurement on MRI. After discussion of initial surgical treatment options we have elected to proceed with needle localized lumpectomy and left axillary sentinel lymph node biopsy. I discussed the nature of the surgery and risks of bleeding, infection, lymphedema, anesthetic risks and possibility for further surgery based on final pathology. All her questions were answered.  Procedure Detail:  Following successful needle localization the patient was brought to the holding area and under IV sedation 1 mCi of technetium sulfur colloid was injected intradermally around the left nipple. The patient was then taken to the operating room and laryngeal mask general anesthesia induced. After patient timeout was performed under sterile technique 5 cc of dilute methylene blue was injected beneath the left nipple and massaged for several minutes. The entire left breast and axilla and upper arm were then widely sterilely prepped and draped. A second patient timeout was performed. The procedure was verified. The lumpectomy was approached initially. There was a palpable mass at about the 12 to 1:00 position near the wire insertion site. A curvilinear incision was made directly over this and dissection carried down into the subcutaneous tissue. The palpable area was then completely excised with cautery back into normal appearing tissue. The wire was brought into the incision. A generous specimen of tissue was excised around the shaft of the  tip of the wire to obtain negative margins with the palpable area seemingly well contained within the specimen. The specimen was inked for margins and sent for permanent section. The lumpectomy site was irrigated and complete hemostasis obtained. The soft tissue was divided with Marcaine. The cavity was marked with hemoclips. The breast tissue subcutaneous tissue was closed with interrupted 3-0 Vicryl. Following this attention was turned to the sentinel lymph node biopsy. A definite hot area in the left axilla was identified and a small transverse incision made. Using cautery and blunt dissection with the neoprobe for guide I dissected down to a hot blue lymph node and there was actually a second adjacent hot blue lymph node. These were removed en bloc with cautery. Ex vivo the nose had counts of over 2000 and background in the axilla of less than 20. This was sent for permanent section as hot blue left axillary sentinel lymph node #1 and #2. This wound was irrigated and hemostasis obtained. The soft tissue was infiltrated with Marcaine. The deep tissue subcutaneous was closed with interrupted 3-0 Vicryl. The skin incisions at those sites were closed with subcuticular 5-0 Monocryl and Dermabond. Sponge needle instrument counts were correct.   Estimated Blood Loss:  Minimal         Drains: none  Blood Given: none          Specimens: #1 left breast lumpectomy #2 hot blue left axillary sentinel lymph nodes x2        Complications:  * No complications entered in OR log *         Disposition: PACU - hemodynamically stable.         Condition: stable

## 2012-09-13 ENCOUNTER — Encounter (HOSPITAL_COMMUNITY): Payer: Self-pay | Admitting: General Surgery

## 2012-09-14 ENCOUNTER — Telehealth: Payer: Self-pay | Admitting: Family Medicine

## 2012-09-14 DIAGNOSIS — E119 Type 2 diabetes mellitus without complications: Secondary | ICD-10-CM

## 2012-09-14 NOTE — Telephone Encounter (Signed)
Pt would like have A1C  Drawn due to dm.Can I sch?

## 2012-09-14 NOTE — Telephone Encounter (Signed)
lmom to callback to sch appt

## 2012-09-14 NOTE — Telephone Encounter (Signed)
Okay to schedule... Orders Placed This Encounter  Procedures  . Hemoglobin A1c    Standing Status: Future     Number of Occurrences:      Standing Expiration Date: 09/14/2013  . Basic metabolic panel    Standing Status: Future     Number of Occurrences:      Standing Expiration Date: 09/14/2013

## 2012-09-15 ENCOUNTER — Telehealth (INDEPENDENT_AMBULATORY_CARE_PROVIDER_SITE_OTHER): Payer: Self-pay | Admitting: General Surgery

## 2012-09-15 ENCOUNTER — Encounter (INDEPENDENT_AMBULATORY_CARE_PROVIDER_SITE_OTHER): Payer: Self-pay

## 2012-09-15 NOTE — Telephone Encounter (Signed)
Call the patient and discussed her pathology report. We discussed that she has a focal close margin of DCIS at less than 1 mm. I told her that I did not feel strongly that needed to be reexcised in her particular situation but that we would get a radiation oncology opinion ASAP before making any definite decisions.

## 2012-09-15 NOTE — Telephone Encounter (Signed)
Pt is sch for 09-20-12

## 2012-09-16 ENCOUNTER — Other Ambulatory Visit (INDEPENDENT_AMBULATORY_CARE_PROVIDER_SITE_OTHER): Payer: Self-pay

## 2012-09-16 DIAGNOSIS — C50912 Malignant neoplasm of unspecified site of left female breast: Secondary | ICD-10-CM

## 2012-09-19 ENCOUNTER — Telehealth: Payer: Self-pay | Admitting: *Deleted

## 2012-09-19 ENCOUNTER — Other Ambulatory Visit: Payer: Self-pay | Admitting: Emergency Medicine

## 2012-09-19 DIAGNOSIS — C50912 Malignant neoplasm of unspecified site of left female breast: Secondary | ICD-10-CM

## 2012-09-19 NOTE — Telephone Encounter (Signed)
On 09/16/12, I confirmed pt to see Dr. Welton Flakes on 09/21/12.  Unable to mail before appt letter & packet to pt - gave verbal and placed in the notes for registration to give her an intake form to fill out.  Emailed Clydie Braun for a [employer].  Emailed Christy and Dr. Johna Sheriff at [facility] to make them aware.  Made chart.

## 2012-09-20 ENCOUNTER — Ambulatory Visit: Payer: 59

## 2012-09-20 ENCOUNTER — Telehealth: Payer: Self-pay | Admitting: *Deleted

## 2012-09-20 ENCOUNTER — Encounter: Payer: Self-pay | Admitting: Oncology

## 2012-09-20 ENCOUNTER — Encounter: Payer: Self-pay | Admitting: Radiation Oncology

## 2012-09-20 ENCOUNTER — Other Ambulatory Visit (HOSPITAL_BASED_OUTPATIENT_CLINIC_OR_DEPARTMENT_OTHER): Payer: 59 | Admitting: Lab

## 2012-09-20 ENCOUNTER — Other Ambulatory Visit (INDEPENDENT_AMBULATORY_CARE_PROVIDER_SITE_OTHER): Payer: Medicare Other

## 2012-09-20 ENCOUNTER — Ambulatory Visit (HOSPITAL_BASED_OUTPATIENT_CLINIC_OR_DEPARTMENT_OTHER): Payer: 59 | Admitting: Oncology

## 2012-09-20 VITALS — BP 140/76 | HR 69 | Temp 97.6°F | Resp 20 | Ht 62.0 in | Wt 130.8 lb

## 2012-09-20 DIAGNOSIS — M949 Disorder of cartilage, unspecified: Secondary | ICD-10-CM

## 2012-09-20 DIAGNOSIS — E119 Type 2 diabetes mellitus without complications: Secondary | ICD-10-CM

## 2012-09-20 DIAGNOSIS — I10 Essential (primary) hypertension: Secondary | ICD-10-CM

## 2012-09-20 DIAGNOSIS — Z170 Estrogen receptor positive status [ER+]: Secondary | ICD-10-CM

## 2012-09-20 DIAGNOSIS — C50912 Malignant neoplasm of unspecified site of left female breast: Secondary | ICD-10-CM

## 2012-09-20 DIAGNOSIS — C50919 Malignant neoplasm of unspecified site of unspecified female breast: Secondary | ICD-10-CM

## 2012-09-20 DIAGNOSIS — C50119 Malignant neoplasm of central portion of unspecified female breast: Secondary | ICD-10-CM

## 2012-09-20 LAB — COMPREHENSIVE METABOLIC PANEL (CC13)
ALT: 17 U/L (ref 0–55)
AST: 16 U/L (ref 5–34)
Alkaline Phosphatase: 74 U/L (ref 40–150)
Creatinine: 1.3 mg/dL — ABNORMAL HIGH (ref 0.6–1.1)
Total Bilirubin: 0.49 mg/dL (ref 0.20–1.20)

## 2012-09-20 LAB — BASIC METABOLIC PANEL
BUN: 30 mg/dL — ABNORMAL HIGH (ref 6–23)
Chloride: 104 mEq/L (ref 96–112)
Creatinine, Ser: 1.2 mg/dL (ref 0.4–1.2)
GFR: 48.32 mL/min — ABNORMAL LOW (ref >60.00)
Glucose, Bld: 142 mg/dL — ABNORMAL HIGH (ref 70–99)
Potassium: 4.4 mEq/L (ref 3.5–5.1)

## 2012-09-20 LAB — CBC WITH DIFFERENTIAL/PLATELET
BASO%: 0.7 % (ref 0.0–2.0)
EOS%: 3.4 % (ref 0.0–7.0)
HCT: 42.6 % (ref 34.8–46.6)
LYMPH%: 23.9 % (ref 14.0–49.7)
MCH: 31.2 pg (ref 25.1–34.0)
MCHC: 33.2 g/dL (ref 31.5–36.0)
NEUT%: 62.8 % (ref 38.4–76.8)
Platelets: 211 10*3/uL (ref 145–400)
RBC: 4.54 10*6/uL (ref 3.70–5.45)

## 2012-09-20 NOTE — Patient Instructions (Addendum)
#1 we discussed the pathology. We discussed the stage of your breast cancer which is stage I. A copy of the pathology was given to you.  #2 discussed adjuvant antiestrogen therapy. We discussed use of Arimidex to blockade all estrogens that can feed on breast cancer cells.  #3 you do have an appointment with Dr. Chipper Herb. There is a question of a close margin he will discuss the details of this with you.  #4 I will plan on seeing you back after radiation or sooner if need arises.  Anastrozole tablets What is this medicine? ANASTROZOLE (an AS troe zole) is used to treat breast cancer in women who have gone through menopause. Some types of breast cancer depend on estrogen to grow, and this medicine can stop tumor growth by blocking estrogen production. This medicine may be used for other purposes; ask your health care provider or pharmacist if you have questions. What should I tell my health care provider before I take this medicine? They need to know if you have any of these conditions: -liver disease -an unusual or allergic reaction to anastrozole, other medicines, foods, dyes, or preservatives -pregnant or trying to get pregnant -breast-feeding How should I use this medicine? Take this medicine by mouth with a glass of water. Follow the directions on the prescription label. You can take this medicine with or without food. Take your doses at regular intervals. Do not take your medicine more often than directed. Do not stop taking except on the advice of your doctor or health care professional. Talk to your pediatrician regarding the use of this medicine in children. Special care may be needed. Overdosage: If you think you have taken too much of this medicine contact a poison control center or emergency room at once. NOTE: This medicine is only for you. Do not share this medicine with others. What if I miss a dose? If you miss a dose, take it as soon as you can. If it is almost time for  your next dose, take only that dose. Do not take double or extra doses. What may interact with this medicine? Do not take this medicine with any of the following medications: -female hormones, like estrogens or progestins and birth control pills This medicine may also interact with the following medications: -tamoxifen This list may not describe all possible interactions. Give your health care provider a list of all the medicines, herbs, non-prescription drugs, or dietary supplements you use. Also tell them if you smoke, drink alcohol, or use illegal drugs. Some items may interact with your medicine. What should I watch for while using this medicine? Visit your doctor or health care professional for regular checks on your progress. Let your doctor or health care professional know about any unusual vaginal bleeding. Do not treat yourself for diarrhea, nausea, vomiting or other side effects. Ask your doctor or health care professional for advice. What side effects may I notice from receiving this medicine? Side effects that you should report to your doctor or health care professional as soon as possible: -allergic reactions like skin rash, itching or hives, swelling of the face, lips, or tongue -any new or unusual symptoms -breathing problems -chest pain -leg pain or swelling -vomiting Side effects that usually do not require medical attention (report to your doctor or health care professional if they continue or are bothersome): -back or bone pain -cough, or throat infection -diarrhea or constipation -dizziness -headache -hot flashes -loss of appetite -nausea -sweating -weakness and tiredness -weight gain  This list may not describe all possible side effects. Call your doctor for medical advice about side effects. You may report side effects to FDA at 1-800-FDA-1088. Where should I keep my medicine? Keep out of the reach of children. Store at room temperature between 20 and 25 degrees  C (68 and 77 degrees F). Throw away any unused medicine after the expiration date. NOTE: This sheet is a summary. It may not cover all possible information. If you have questions about this medicine, talk to your doctor, pharmacist, or health care provider.  2013, [employer]. (07/01/2007 4:31:52 PM)

## 2012-09-20 NOTE — Progress Notes (Signed)
Location of Breast Cancer: Left upper outer quadrant(12-1 o'clock) Bx 08/25/12  Histology per Pathology Report: 09/12/12: 1. Breast, lumpectomy, Left - INVASIVE DUCTAL CARCINOMA WITH CALCIFICATIONS, GRADE I/III, SPANNING 0.8 CM. - DUCTAL CARCINOMA IN SITU, LOW GRADE. - DUCTAL CARCINOMA IN SITU IS FOCALLY LESS THAN 0.1 CM TO THE MEDIAL MARGIN. - SEE ONCOLOGY TABLE BELOW. 2. Lymph node, sentinel, biopsy, Left axillary #1 - THERE IS NO EVIDENCE OF CARCINOMA IN 1 OF 1 LYMPH NODE (0/1). Receptor Status: ER(+ PR (+), Her2-neu (-)  Did patient present with symptoms (if so, please note symptoms) or was this found on screening mammography? found on mammogram  Past/Anticipated interventions by surgeon, if any: hx years ago benign cyst removed from right breast, 09/12/12: Lumpectomy left breast  Past/Anticipated interventions by medical oncology, if any: Dr Welton Flakes discussed antiestrogen therapy w/Arimidex x 5 yrs Lymphedema issues, if any:  none  Pain issues, if any:  none  SAFETY ISSUES:  Prior radiation? No  Pacemaker/ICD? NO  Possible current pregnancy? NO  Is the patient on methotrexate? NO  Current Complaints / other details:  Married, 3 children, 7 grandchildren, retired Scientist, research (medical), daughter w/pt today. Pt states left breast/axilla "sore" from surgery.

## 2012-09-20 NOTE — Progress Notes (Signed)
Checked in new pt with no financial concerns. °

## 2012-09-20 NOTE — Progress Notes (Signed)
Adrienne [name] [id number] [birth year] 78 y.o. 09/20/2012 1:51 PM  CC  Adrienne [name], MD [address] [place] [address] Dr. Glenna Fellows Dr. Chipper Herb  REASON FOR CONSULTATION:  78 year old female with new diagnosis of T1 N0 invasive ductal carcinoma of the left breast status post lumpectomy and sentinel lymph node biopsy. Patient is seen in medical oncology for discussion of treatment options.   STAGE:  Left breast Invasive ductal carcinoma ER positive100% PR positive 88%HER-2/neu negative Ki-67 16% T1 N0( stage I) Status post lumpectomy with sentinel lymph node biopsy on 09/12/2012   REFERRING PHYSICIAN: Dr. Glenna Fellows  HISTORY OF PRESENT ILLNESS:  Adrienne [name] is a 78 y.o. female.  Without prior oncologic history who had a routine screening mammogram performed that showed an abnormality in the left breastand this was confirmed by ultrasound.her mammograms were done at [facility].she went on to have a core needle biopsy performed that showed invasive ductal carcinoma with associated DCIS and calcifications. Tumor was ER positive PR positive HER-2/neu negative with Ki-67 of 18%. Patient had MRI of the breasts performed on 08/29/2012 the MRI revealed Within the upper central portion of the left breast enhancing mass measuring 1.2 x 1.2 x 1.2 cm. No others suspicious abnormalities were found.she went on to have a lumpectomy with sentinel lymph node biopsy on 09/12/2012. The final pathology revealed a 0.8 cm invasive ductal carcinoma grade 1 measuring 0.8 cm ER +100% PR +88% HER-2/neu negative Ki-67 16%. One sentinel node was negative for metastatic disease. She did have a focal close margin of DCIS at less than 1 mm. She is now seen in medical oncology for discussion of adjuvant treatment. Any complaints. She is accompanied by her daughter.She is scheduled to be by Dr. Chipper Herb for radiation oncology on 09/21/2012.  Past Medical History: Past  Medical History  Diagnosis Date  . Hyperlipidemia   . Hypertension     Does not see a cardiologist  . History of frequent urinary tract infections   . Breast cancer 08/25/12    Invasive ductal ca,DCIS  . Allergy   . Diabetes mellitus     type II  . Hearing loss   . Gout   . Osteopenia     Past Surgical History: Past Surgical History  Procedure Laterality Date  . Appendectomy    . Cholecystectomy    . Total abdominal hysterectomy w/ bilateral salpingoophorectomy    . Breast surgery    . Septoplasty    . Breast lumpectomy with needle localization and axillary sentinel lymph node bx Left 09/12/2012    Procedure: LEFT NEEDLE LOCALIZATION BREAST LUMPECTOMY TION AND AXILLARY SENTINEL LYMPH NODE BX;  Surgeon: Mariella Saa, MD;  Location: [facility] OR;  Service: General;  Laterality: Left;  . Abdominal hysterectomy Bilateral     w/b/l salpingo-oopherectomy    Family History: Family History  Problem Relation Age of Onset  . Hypertension Mother   . Coronary artery disease Mother   . Hypertension Father     Social History History  Substance Use Topics  . Smoking status: Former Smoker -- 0.25 packs/day for 4 years    Types: Cigarettes  . Smokeless tobacco: Not on file     Comment: Cigarette use was 50 years ago  . Alcohol Use: Yes     Comment: Occ glass of wine with dinner    Allergies: Allergies  Allergen Reactions  . Penicillins Hives and Swelling    Current Medications: Current Outpatient Prescriptions  Medication Sig  Dispense Refill  . ACCU-CHEK FASTCLIX LANCETS MISC 1 each by Does not apply route daily.  102 each  3  . allopurinol (ZYLOPRIM) 300 MG tablet Take 300 mg by mouth daily.      Marland Kitchen aspirin 81 MG tablet Take 81 mg by mouth daily.      Marland Kitchen atenolol-chlorthalidone (TENORETIC) 50-25 MG per tablet Take 0.5 tablets by mouth daily.      . calcium-vitamin D 250-100 MG-UNIT per tablet Take 1 tablet by mouth 2 (two) times daily.      . fish oil-omega-3 fatty acids  1000 MG capsule Take 1 g by mouth daily.      Marland Kitchen glucose blood (ACCU-CHEK AVIVA) test strip 1 each by Other route 2 (two) times daily. Dx 250.00  100 each  12  . metFORMIN (GLUCOPHAGE) 500 MG tablet Take 1 tablet (500 mg total) by mouth 2 (two) times daily with a meal.  200 tablet  3  . Multiple Vitamins-Minerals (CENTRUM SILVER) tablet Take 1 tablet by mouth daily.        . Multiple Vitamins-Minerals (ICAPS PO) Take 2 tablets by mouth 2 (two) times daily.      . potassium chloride SA (K-DUR,KLOR-CON) 20 MEQ tablet Take 20 mEq by mouth 2 (two) times daily.      . simvastatin (ZOCOR) 40 MG tablet Take 40 mg by mouth every evening.      Marland Kitchen HYDROcodone-acetaminophen (NORCO/VICODIN) 5-325 MG per tablet Take 1-2 tablets by mouth every 4 (four) hours as needed for pain.  25 tablet  1   No current facility-administered medications for this visit.    OB/GYN History: menarache at 30, menopause, s/p hystectomy in 40, age at first live24, 3 births, HRTyes  Fertility Discussion: n/a Prior History of Cancer: none  Health Maintenance:  Colonoscopy yes Bone Density yes Last PAP smear yes  ECOG PERFORMANCE STATUS: 0 - Asymptomatic  Genetic Counseling/testing: none  REVIEW OF SYSTEMS:  A comprehensive review of systems was negative.  PHYSICAL EXAMINATION: Blood pressure 140/76, pulse 69, temperature 97.6 F (36.4 C), temperature source Oral, resp. rate 20, height 5\' 2"  (1.575 m), weight 130 lb 12.8 oz (59.33 kg).  Patient is awake alert in no acute distress well-developed well-nourished female HEENT exam EOMI PERRLA sclerae anicteric no conjunctival pallor oral mucosa is moist neck is supple lungs are clear cardiovascular regular rate rhythm abdomen soft nontender no splenomegaly extremities no edema neuro is nonfocal left breast healing incisional scar no nodularity no tenderness no seromas. Right breast no masses or nipple discharge   STUDIES/RESULTS: Chest 2 View  09/08/2012   *RADIOLOGY  REPORT*  Clinical Data: Preoperative respiratory exam.  Left breast cancer.  CHEST - 2 VIEW  Comparison: Chest x-ray dated 05/17/2007  Findings: The heart size and pulmonary vascularity are normal. There is a tiny area of scarring at the left lung base laterally, unchanged.  Lungs are otherwise clear.  No effusions.  No osseous abnormality.  IMPRESSION: No acute abnormalities.   Original Report Authenticated By: Francene Boyers, M.D.   Mr Breast Bilateral W Wo Contrast  08/29/2012   *RADIOLOGY REPORT*  Clinical Data: The patient has been recently diagnosed with ductal carcinoma in situ and invasive ductal carcinoma following biopsy of calcifications in the 12 o'clock location of the left breast.  BUN and creatinine were obtained on site at [facility] at [address] Results:  BUN 26 mg/dL,  Creatinine 1.0 mg/dL.  BILATERAL BREAST MRI WITH AND WITHOUT CONTRAST  Technique: Multiplanar, multisequence MR images of both breasts were obtained prior to and following the intravenous administration of 12ml of Multihance.  Three dimensional images were evaluated at the independent DynaCad workstation.  Comparison:  Mamm[facility] from [facility] 08/25/2012 and earlier  Findings: Within the upper central portion of the left breast, there is an enhancing mass which shows persistent type enhancement kinetics and measures 1.2 x 1.2 x 1.2 cm.  This is associated with signal void from tissue marker clip placed at the time of stereotactic guided core biopsy which showed malignancy.  Elsewhere within the left breast, no suspicious abnormalities are identified.   Images of the right breast are negative.  Background parenchymal enhancement is minimal.  No internal mammary or axillary adenopathy identified.  IMPRESSION:  1.  Findings consistent with known malignancy in the upper central portion of the left breast. 2.  No suspicious findings in the right breast.  RECOMMENDATION: Treatment plan  THREE-DIMENSIONAL MR IMAGE RENDERING  ON INDEPENDENT WORKSTATION:  Three-dimensional MR images were rendered by post-processing of the original MR data on an independent workstation.  The three- dimensional MR images were interpreted, and findings were reported in the accompanying complete MRI report for this study.  BI-RADS CATEGORY 6:  Known biopsy-proven malignancy - appropriate action should be taken.   Original Report Authenticated By: Norva Pavlov, M.D.   Nm Sentinel Node Inj-no Rpt (breast)  09/12/2012   CLINICAL DATA: left breast cancer   Sulfur colloid was injected intradermally by the nuclear medicine  technologist for breast cancer sentinel node localization.      LABS:    Chemistry      Component Value Date/Time   NA 141 09/20/2012 0852   K 4.4 09/20/2012 0852   CL 104 09/20/2012 0852   CO2 28 09/20/2012 0852   BUN 30* 09/20/2012 0852   CREATININE 1.2 09/20/2012 0852      Component Value Date/Time   CALCIUM 9.7 09/20/2012 0852   ALKPHOS 74 09/08/2012 0935   AST 25 09/08/2012 0935   ALT 20 09/08/2012 0935   BILITOT 0.5 09/08/2012 0935      Lab Results  Component Value Date   WBC 8.5 09/20/2012   HGB 14.1 09/20/2012   HCT 42.6 09/20/2012   MCV 93.9 09/20/2012   PLT 211 09/20/2012   PATHOLOGY: Diagnosis 1. Breast, lumpectomy, Left - INVASIVE DUCTAL CARCINOMA WITH CALCIFICATIONS, GRADE I/III, SPANNING 0.8 CM. - DUCTAL CARCINOMA IN SITU, LOW GRADE. - DUCTAL CARCINOMA IN SITU IS FOCALLY LESS THAN 0.1 CM TO THE MEDIAL MARGIN. - SEE ONCOLOGY TABLE BELOW. 2. Lymph node, sentinel, biopsy, Left axillary #1 - THERE IS NO EVIDENCE OF CARCINOMA IN 1 OF 1 LYMPH NODE (0/1). Microscopic Comment 1. BREAST, INVASIVE TUMOR, WITH LYMPH NODE SAMPLING Specimen, including laterality: Left breast Procedure: Needle localized lumpectomy Grade: I Tubule formation: III Nuclear pleomorphism: I Mitotic:I Tumor size (glass slide measurement): 0.8 cm Margins: Invasive, distance to closest margin: Greater than 0.2 cm to all  margins In-situ, distance to closest margin: Focally less than 0.1 cm to medial margin (glass slide measurement) Lymphovascular invasion: Not identified Ductal carcinoma in situ: Present Grade: Low grade Extensive intraductal component: Not identified Lobular neoplasia: Not identified Tumor focality: Unifocal Treatment effect: N/A Extent of tumor: Breast parenchyma Lymph nodes: # examined: 1 Lymph nodes with metastasis: 0 Breast prognostic profile: [phone] 1 of 2 FINAL for KINSLEE, DALPE ([id number]) Microscopic Comment(continued) Estrogen receptor: 100%, strong staining intensity Progesterone receptor: 88%, strong staining intensity Her 2 neu: No  amplification was detected. The ratio was 1.62. Her2 neu by C[facility] will be repeated on the current case and the results reported separately. Ki-67: 16%. Non-neoplastic breast: Fibrocystic changes with calcifications and healing biopsy site. TNM: pT1b, pN0 (JBK:caf 09/14/12)  ASSESSMENT    78 year old female with  #1 T1 N0, stage I invasive ductal carcinoma of the left breast status post lumpectomy. Final pathology revealed a 0.8 cm disease that was ER/PR positive HER-2/neu negative with unfavorable Ki-67 of 16%. Postoperatively she is doing well. She is seen in medical oncology for discussion of adjuvant treatment options. She and I discussed antiestrogen therapy. I do think she has a favorable disease and does not require chemotherapy for a strongly ER positive disease. We discussed her pathology rationale for adjuvant treatment with antiestrogen therapy only such as Arimidex 1 mg daily.  #2 certainly patient will need postop radiation therapy and the close margin does need to be addressed as well. Patient apparently did get a phone call from Dr. Terrill Mohr to explain to the patient that he was not concerned about the margin as far as reexcision was concerned. However patient does need radiation oncology input and she is scheduled to be  seen by Dr. Chipper Herb tomorrow.  Clinical Trial Eligibility: no Multidisciplinary conference discussion yes     PLAN:    #1 patient will proceed with radiation therapy she has an appointment set up for initial consultation.  #2 we discussed antiestrogen therapy with Arimidex 1 mg daily discussed the pros and cons of this. We also discussed length of treatment would be 5 years.her graft   #3 patient will need a bone density scan performed and I will set this up for her.   #4 I will see her back in about 2-3 months time or sooner if need arise    Discussion: Patient is being treated per NCCN breast cancer care guidelines appropriate for stage.I   Thank you so much for allowing me to participate in the care of Adrienne [name]. I will continue to follow up the patient with you and assist in her care.  All questions were answered. The patient knows to call the clinic with any problems, questions or concerns. We can certainly see the patient much sooner if necessary.  I spent 55 minutes counseling the patient face to face. The total time spent in the appointment was 60 minutes.  Drue Second, MD Medical/Oncology [facility] [phone] (beeper) [phone] (Office)  09/20/2012, 9:58 PM

## 2012-09-20 NOTE — Telephone Encounter (Signed)
appts made and printed. Pt requested to come back the 1st week of Sept. gv her appt d/t for 01/05/13...td

## 2012-09-21 ENCOUNTER — Telehealth: Payer: Self-pay | Admitting: *Deleted

## 2012-09-21 ENCOUNTER — Ambulatory Visit
Admission: RE | Admit: 2012-09-21 | Discharge: 2012-09-21 | Disposition: A | Discharge status: Home / Self Care | Payer: Medicare Other | Source: Ambulatory Visit | Attending: Radiation Oncology | Admitting: Radiation Oncology

## 2012-09-21 ENCOUNTER — Encounter: Payer: Self-pay | Admitting: Radiation Oncology

## 2012-09-21 VITALS — BP 141/76 | HR 66 | Temp 97.5°F | Resp 20 | Wt 131.0 lb

## 2012-09-21 DIAGNOSIS — I10 Essential (primary) hypertension: Secondary | ICD-10-CM | POA: Insufficient documentation

## 2012-09-21 DIAGNOSIS — M949 Disorder of cartilage, unspecified: Secondary | ICD-10-CM | POA: Insufficient documentation

## 2012-09-21 DIAGNOSIS — N6459 Other signs and symptoms in breast: Secondary | ICD-10-CM | POA: Insufficient documentation

## 2012-09-21 DIAGNOSIS — E119 Type 2 diabetes mellitus without complications: Secondary | ICD-10-CM | POA: Insufficient documentation

## 2012-09-21 DIAGNOSIS — Z9089 Acquired absence of other organs: Secondary | ICD-10-CM | POA: Insufficient documentation

## 2012-09-21 DIAGNOSIS — Z79899 Other long term (current) drug therapy: Secondary | ICD-10-CM | POA: Insufficient documentation

## 2012-09-21 DIAGNOSIS — Z170 Estrogen receptor positive status [ER+]: Secondary | ICD-10-CM | POA: Insufficient documentation

## 2012-09-21 DIAGNOSIS — E785 Hyperlipidemia, unspecified: Secondary | ICD-10-CM | POA: Insufficient documentation

## 2012-09-21 DIAGNOSIS — C50912 Malignant neoplasm of unspecified site of left female breast: Secondary | ICD-10-CM

## 2012-09-21 DIAGNOSIS — Z87440 Personal history of urinary (tract) infections: Secondary | ICD-10-CM | POA: Insufficient documentation

## 2012-09-21 DIAGNOSIS — Z90710 Acquired absence of both cervix and uterus: Secondary | ICD-10-CM | POA: Insufficient documentation

## 2012-09-21 DIAGNOSIS — M899 Disorder of bone, unspecified: Secondary | ICD-10-CM | POA: Insufficient documentation

## 2012-09-21 DIAGNOSIS — C50919 Malignant neoplasm of unspecified site of unspecified female breast: Secondary | ICD-10-CM | POA: Insufficient documentation

## 2012-09-21 HISTORY — DX: Unspecified hearing loss, unspecified ear: H91.90

## 2012-09-21 HISTORY — DX: Allergy, unspecified, initial encounter: T78.40XA

## 2012-09-21 HISTORY — DX: Other specified disorders of bone density and structure, unspecified site: M85.80

## 2012-09-21 HISTORY — DX: Gout, unspecified: M10.9

## 2012-09-21 NOTE — Progress Notes (Signed)
Please see the Nurse Progress Note in the MD Initial Consult Encounter for this patient. 

## 2012-09-21 NOTE — Telephone Encounter (Signed)
Called patient to inform of test and sim , no answer , will call later.

## 2012-09-21 NOTE — Telephone Encounter (Signed)
CALLED PATIENT TO INFORM OF TEST AND SIM, SPOKE WITH PATIENT AND SHE IS AWARE OF THESE APPTS.

## 2012-09-21 NOTE — Addendum Note (Signed)
Encounter addended by: Maryln Gottron, MD on: 09/21/2012  8:58 AM<BR>     Documentation filed: Orders

## 2012-09-21 NOTE — Addendum Note (Signed)
Encounter addended by: Glennie Hawk, RN on: 09/21/2012  9:38 AM<BR>     Documentation filed: Charges VN

## 2012-09-21 NOTE — Progress Notes (Signed)
[facility] Radiation Oncology NEW PATIENT EVALUATION  Name: Adrienne [name] MRN: [id number]  Date:   09/21/2012           DOB: 10/29/1934  Status: outpatient   CC: TODD,JEFFREY [name], MD  Hoxworth, Lorne Skeens, MD    REFERRING PHYSICIAN: Johna Sheriff Lorne Skeens, MD   DIAGNOSIS: Stage I (T1, N0, M0) invasive ductal/DCIS of the left breast    HISTORY OF PRESENT ILLNESS:  Adrienne [name] is a 78 y.o. female who is seen today for the courtesy of Dr. Johna Sheriff for consideration of radiation therapy following conservative surgery and management of her T1 N0 invasive ductal/DCIS of the left breast. She states that she first noted a slight inversion of her left nipple 4-5 years ago. A screening mammogram on 08/18/2012 showed clustered microcalcifications in the 12:00 retroareolar region the left breast. Additional views on April 21 showed indeterminate calcifications in the 12:00 position, 3 cm from the nipple. Stereotactic biopsy on 08/25/2012 was diagnostic for invasive ductal carcinoma along with DCIS with associated calcifications. Dr. Johna Sheriff performed a left partial mastectomy and sentinel lymph node biopsy on 09/12/2012. She was found to have a 0.8 cm invasive ductal carcinoma with calcifications along with low-grade DCIS. DCIS was focally less than 0.1 cm to the medial margin. A single sentinel lymph node was negative for metastatic disease. Her tumor was strongly ER positive at 100% and PR positive at 80%. Ki-67 was 16%. The tumor was HER-2/neu negative. She was seen by Dr. Welton Flakes who offered adjuvant antiestrogen therapy following radiation therapy. She was presented at the multidisciplinary conference this morning. She is without complaints today. She tells me that it is important for her to go on family beach trip in early July.  PREVIOUS RADIATION THERAPY: No   PAST MEDICAL HISTORY:  has a past medical history of Hyperlipidemia; Hypertension; History of frequent urinary tract  infections; Breast cancer (08/25/12); Allergy; Diabetes mellitus; Hearing loss; Gout; and Osteopenia.     PAST SURGICAL HISTORY:  Past Surgical History  Procedure Laterality Date  . Appendectomy    . Cholecystectomy    . Total abdominal hysterectomy w/ bilateral salpingoophorectomy    . Breast surgery  1990's    right- fibroid cyst  . Septoplasty    . Breast lumpectomy with needle localization and axillary sentinel lymph node bx Left 09/12/2012    Procedure: LEFT NEEDLE LOCALIZATION BREAST LUMPECTOMY TION AND AXILLARY SENTINEL LYMPH NODE BX;  Surgeon: Mariella Saa, MD;  Location: [facility] OR;  Service: General;  Laterality: Left;  . Abdominal hysterectomy Bilateral 1999    w/b/l salpingo-oopherectomy     FAMILY HISTORY: family history includes Cancer in her cousin; Coronary artery disease in her mother; and [name] her father died of a brain aneurysm and 64. Her mother died from "old age" at 25. Family history remarkable for a maternal cousin was diagnosed with breast cancer at age 63.   SOCIAL HISTORY:  reports that she has quit smoking. Her smoking use included Cigarettes. She has a 1 pack-year smoking history. She does not have any smokeless tobacco history on file. She reports that  drinks alcohol. She reports that she does not use illicit drugs. married, 3 children. 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   ALLERGIES: Penicillins   MEDICATIONS:  Current Outpatient Prescriptions  Medication Sig Dispense Refill  . ACCU-CHEK FASTCLIX LANCETS MISC 1 each by Does not apply route daily.  102  each  3  . allopurinol (ZYLOPRIM) 300 MG tablet Take 300 mg by mouth daily.      Marland Kitchen aspirin 81 MG tablet Take 81 mg by mouth daily.      Marland Kitchen atenolol-chlorthalidone (TENORETIC) 50-25 MG per tablet Take 0.5 tablets by mouth daily.      . calcium-vitamin D 250-100 MG-UNIT per tablet Take 1 tablet by mouth 2 (two) times daily.      . fish oil-omega-3  fatty acids 1000 MG capsule Take 1 g by mouth daily.      Marland Kitchen glucose blood (ACCU-CHEK AVIVA) test strip 1 each by Other route 2 (two) times daily. Dx 250.00  100 each  12  . HYDROcodone-acetaminophen (NORCO/VICODIN) 5-325 MG per tablet Take 1-2 tablets by mouth every 4 (four) hours as needed for pain.  25 tablet  1  . metFORMIN (GLUCOPHAGE) 500 MG tablet Take 1 tablet (500 mg total) by mouth 2 (two) times daily with a meal.  200 tablet  3  . Multiple Vitamins-Minerals (CENTRUM SILVER) tablet Take 1 tablet by mouth daily.        . Multiple Vitamins-Minerals (ICAPS PO) Take 2 tablets by mouth 2 (two) times daily.      . potassium chloride SA (K-DUR,KLOR-CON) 20 MEQ tablet Take 20 mEq by mouth 2 (two) times daily.      . simvastatin (ZOCOR) 40 MG tablet Take 40 mg by mouth every evening.       No current facility-administered medications for this encounter.     REVIEW OF SYSTEMS:  Pertinent items are noted in HPI.    PHYSICAL EXAM:  weight is 131 lb (59.421 kg). Her oral temperature is 97.5 F (36.4 C). Her blood pressure is 141/76 and her pulse is 66. Her respiration is 20.   Alert and oriented 78 year old white female appearing younger than her stated age. Head and neck examination: Grossly unremarkable. Nodes: Without cervical, supraclavicular, or axillary lymphadenopathy. Chest: Lungs clear. Heart: Regular rate rhythm. Back: Without spinal or CVA tenderness. Breasts: There is a partial mastectomy wound along the upper-outer quadrant of the left breast extending from 12 to 2:00. The left nipple is slightly inverted. No masses are appreciated. Right breast without masses or lesions. Abdomen without hepatomegaly. Extremities without edema. Neurologic examination: Grossly nonfocal.   LABORATORY DATA:  Lab Results  Component Value Date   WBC 8.5 09/20/2012   HGB 14.1 09/20/2012   HCT 42.6 09/20/2012   MCV 93.9 09/20/2012   PLT 211 09/20/2012   Lab Results  Component Value Date   NA 140  09/20/2012   K 3.6 09/20/2012   CL 104 09/20/2012   CO2 27 09/20/2012   Lab Results  Component Value Date   ALT 17 09/20/2012   AST 16 09/20/2012   ALKPHOS 74 09/20/2012   BILITOT 0.49 09/20/2012      IMPRESSION: Stage I (T1, N0, M0) invasive ductal/DCIS of the left breast. Local treatment options include mastectomy versus partial mastectomy with or without radiation therapy, with without adjuvant hormone therapy. In view of her good performance status, and close margin for DCIS I would offer her radiation therapy and avoid reexcision. She desires breast preservation. I would like to obtain a mammogram at [facility] in early to mid July to rule out suspicious residual microcalcifications in view of her close surgical margin. We would move ahead with radiation therapy following her beach trip in early to mid July. We discussed the potential acute and late toxicities of radiation therapy.  She will receive hypo-fractionated radiation therapy and be considered for deep inspiration/breath-hold technology to avoid cardiac irradiation if indicated. Consent is signed today.   PLAN: As discussed above.  I spent 45 minutes minutes face to face with the patient and more than 50% of that time was spent in counseling and/or coordination of care.

## 2012-09-22 ENCOUNTER — Ambulatory Visit: Payer: 59 | Admitting: Radiation Oncology

## 2012-09-22 ENCOUNTER — Encounter (INDEPENDENT_AMBULATORY_CARE_PROVIDER_SITE_OTHER): Payer: Self-pay

## 2012-09-29 ENCOUNTER — Ambulatory Visit (INDEPENDENT_AMBULATORY_CARE_PROVIDER_SITE_OTHER): Payer: Medicare Other | Admitting: General Surgery

## 2012-09-29 ENCOUNTER — Encounter (INDEPENDENT_AMBULATORY_CARE_PROVIDER_SITE_OTHER): Payer: Self-pay | Admitting: General Surgery

## 2012-09-29 VITALS — BP 142/80 | HR 64 | Temp 97.6°F | Resp 16 | Ht 62.0 in | Wt 131.6 lb

## 2012-09-29 DIAGNOSIS — C50919 Malignant neoplasm of unspecified site of unspecified female breast: Secondary | ICD-10-CM

## 2012-09-29 DIAGNOSIS — C50912 Malignant neoplasm of unspecified site of left female breast: Secondary | ICD-10-CM

## 2012-09-29 NOTE — Progress Notes (Signed)
History: Patient returns to the office following left breast lumpectomy and sentinel lymph node biopsy. She reports she is doing well with just a little soreness at the axillary incision.  Exam: Breast and axillary incisions are healing very nicely without infection or other consultation.  We had previously reviewed her pathology. This revealed an 8 mm grade 1 invasive cancer with widely negative margins and associated ductal carcinoma in situ which was anchored negative but within 1 mm of the medial margin. This has been discussed with medical and radiation oncology and we're going ahead with radiation and have not recommended reexcision. This was all discussed with the patient as well.  Assessment and plan: Doing well following lumpectomy and sentinel lymph node biopsy without complication. I will see her in 6 months for more long-term followup. She is to begin radiation in July.

## 2012-09-30 ENCOUNTER — Encounter (INDEPENDENT_AMBULATORY_CARE_PROVIDER_SITE_OTHER): Payer: Medicare Other | Admitting: Surgery

## 2012-09-30 NOTE — Addendum Note (Signed)
Encounter addended by: Rayelle Armor Marie Nicolas Banh, RN on: 09/30/2012  3:42 PM<BR>     Documentation filed: Charges VN

## 2012-10-26 ENCOUNTER — Telehealth: Payer: Self-pay | Admitting: Family Medicine

## 2012-10-26 MED ORDER — ALLOPURINOL 300 MG PO TABS: 300.0000 mg | ORAL_TABLET | Freq: Every day | ORAL | Status: DC

## 2012-10-26 MED ORDER — ATENOLOL-CHLORTHALIDONE 50-25 MG PO TABS: 0.5000 | ORAL_TABLET | Freq: Every day | ORAL | Status: DC

## 2012-10-26 MED ORDER — POTASSIUM CHLORIDE CRYS ER 20 MEQ PO TBCR: 20.0000 meq | EXTENDED_RELEASE_TABLET | Freq: Two times a day (BID) | ORAL | Status: DC

## 2012-10-26 NOTE — Telephone Encounter (Signed)
Rx sent 

## 2012-10-26 NOTE — Telephone Encounter (Addendum)
Pt has appt in sept for cpx. Pt would like a 90 day supply on each medications allopurinol 300 mg #90,atenolol 50-25 mg #90 and potassium chloride twice a day #180 sent to [facility] [address]

## 2012-11-09 ENCOUNTER — Ambulatory Visit
Admission: RE | Admit: 2012-11-09 | Discharge: 2012-11-09 | Disposition: A | Discharge status: Home / Self Care | Payer: Medicare Other | Source: Ambulatory Visit | Attending: Radiation Oncology | Admitting: Radiation Oncology

## 2012-11-09 DIAGNOSIS — C50912 Malignant neoplasm of unspecified site of left female breast: Secondary | ICD-10-CM

## 2012-11-09 DIAGNOSIS — R21 Rash and other nonspecific skin eruption: Secondary | ICD-10-CM | POA: Insufficient documentation

## 2012-11-09 DIAGNOSIS — C50919 Malignant neoplasm of unspecified site of unspecified female breast: Secondary | ICD-10-CM | POA: Insufficient documentation

## 2012-11-09 DIAGNOSIS — Z510 Encounter for antineoplastic radiation therapy: Secondary | ICD-10-CM | POA: Insufficient documentation

## 2012-11-09 NOTE — Progress Notes (Signed)
Complex simulation/treatment planning note: The patient was taken to the CT simulator. She was placed on a custom breast board and a custom neck mold was constructed for immobilization. Her left breast field borders were marked with radiopaque wires along with her partial mastectomy scar. She was scanned free breathing. It was determined that she would benefit from deep inspiration and breath-hold to avoid cardiac irradiation. She was then scanned with deep inspiration and breath-hold. Her normal anatomy including her heart were contoured. I also contoured her tumor bed. She was set up to medial and lateral tangential fields. I also contoured her tumor bed on her free breathing scan for her electron beam boost. She is now ready for 3-D simulation for her tangential fields. I prescribing 4250 cGy 17 sessions utilizing 6 MV photons. This be followed by electron beam boost for a further 750 cGy in 3 sessions.

## 2012-11-10 ENCOUNTER — Encounter: Payer: Self-pay | Admitting: Radiation Oncology

## 2012-11-10 NOTE — Progress Notes (Addendum)
.   Radiation Oncology         [phone]) [phone] ________________________________  Name: Adrienne [name] MRN: [id number]  Date: 11/10/2012  DOB: 1934/12/20  RESPIRATORY MOTION MANAGEMENT SIMULATION  NARRATIVE:  In order to account for effect of respiratory motion on target structures and other organs in the planning and delivery of radiotherapy, this patient underwent respiratory motion management simulation.  To accomplish this, when the patient was brought to the CT simulation planning suite, 4D respiratoy motion management CT images were obtained.  The CT images were loaded into the planning software.  Then, using a variety of tools including Cine, MIP, and standard views, the target volume and planning target volumes (PTV) were delineated.  Avoidance structures were contoured.  Treatment planning then occurred.  Dose volume histograms were generated and reviewed for each of the requested structure.  The resulting plan was carefully reviewed and approved today.  3-D simulation note: The patient underwent 3-D simulation for her deep inspiration/breath-hold tangents and the management of her carcinoma of the left breast. Dose volume histograms were obtained for the lungs, heart, and target structures. We met our departmental guidelines. 2 sets of multileaf collimators were designed to conform the field. I prescribing 4250 cGy 17 sessions utilizing 6 MV photons.

## 2012-11-15 ENCOUNTER — Encounter: Payer: Self-pay | Admitting: *Deleted

## 2012-11-15 NOTE — Progress Notes (Signed)
Mailed after appt letter to pt. 

## 2012-11-21 ENCOUNTER — Encounter: Payer: Self-pay | Admitting: Radiation Oncology

## 2012-11-21 ENCOUNTER — Ambulatory Visit
Admission: RE | Admit: 2012-11-21 | Discharge: 2012-11-21 | Disposition: A | Discharge status: Home / Self Care | Payer: Medicare Other | Source: Ambulatory Visit | Attending: Radiation Oncology | Admitting: Radiation Oncology

## 2012-11-21 NOTE — Progress Notes (Signed)
Simulation Verification Note Outpatient Left Breast  The patient was brought to the treatment unit and placed in the planned treatment position. The clinical setup was verified. Then port films were obtained and uploaded to the radiation oncology medical record software.  The treatment beams were carefully compared against the planned radiation fields. The position location and shape of the radiation fields was reviewed. They targeted volume of tissue appears to be appropriately covered by the radiation beams. Organs at risk appear to be excluded as planned.  Based on my personal review, I approved the simulation verification. The patient's treatment will proceed as planned.  -----------------------------------  Lonie Peak, MD

## 2012-11-22 ENCOUNTER — Ambulatory Visit
Admission: RE | Admit: 2012-11-22 | Discharge: 2012-11-22 | Disposition: A | Discharge status: Home / Self Care | Payer: Medicare Other | Source: Ambulatory Visit | Attending: Radiation Oncology | Admitting: Radiation Oncology

## 2012-11-23 ENCOUNTER — Encounter: Payer: Self-pay | Admitting: Radiation Oncology

## 2012-11-23 ENCOUNTER — Ambulatory Visit
Admission: RE | Admit: 2012-11-23 | Discharge: 2012-11-23 | Disposition: A | Discharge status: Home / Self Care | Payer: Medicare Other | Source: Ambulatory Visit | Attending: Radiation Oncology | Admitting: Radiation Oncology

## 2012-11-23 NOTE — Progress Notes (Signed)
Simulation verification note: The patient underwent simulation verification on 11/21/2012 for treatment to her left breast. Her isocenter was in good position and the multileaf collimators contoured the treatment volume appropriately.

## 2012-11-24 ENCOUNTER — Ambulatory Visit
Admission: RE | Admit: 2012-11-24 | Discharge: 2012-11-24 | Disposition: A | Discharge status: Home / Self Care | Payer: Medicare Other | Source: Ambulatory Visit | Attending: Radiation Oncology | Admitting: Radiation Oncology

## 2012-11-25 ENCOUNTER — Ambulatory Visit
Admission: RE | Admit: 2012-11-25 | Discharge: 2012-11-25 | Disposition: A | Discharge status: Home / Self Care | Payer: Medicare Other | Source: Ambulatory Visit | Attending: Radiation Oncology | Admitting: Radiation Oncology

## 2012-11-28 ENCOUNTER — Encounter: Payer: Self-pay | Admitting: Radiation Oncology

## 2012-11-28 ENCOUNTER — Ambulatory Visit
Admission: RE | Admit: 2012-11-28 | Discharge: 2012-11-28 | Disposition: A | Discharge status: Home / Self Care | Payer: Medicare Other | Source: Ambulatory Visit | Attending: Radiation Oncology | Admitting: Radiation Oncology

## 2012-11-28 VITALS — BP 128/63 | HR 67 | Resp 16 | Wt 134.7 lb

## 2012-11-28 DIAGNOSIS — C50912 Malignant neoplasm of unspecified site of left female breast: Secondary | ICD-10-CM

## 2012-11-28 NOTE — Progress Notes (Signed)
Weekly Management Note:  Site: Left breast Current Dose:  1250  cGy Projected Dose: 4250  CGy followed by boost  Narrative: The patient is seen today for routine under treatment assessment. CBCT/MVCT images/port films were reviewed. The chart was reviewed.   No complaints today. She now has Radioplex gel to use when necessary.  Physical Examination:  Filed Vitals:   11/28/12 0910  BP: 128/63  Pulse: 67  Resp: 16  .  Weight: 134 lb 11.2 oz (61.1 kg). No significant skin changes.  Impression: Tolerating radiation therapy well.  Plan: Continue radiation therapy as planned.

## 2012-11-28 NOTE — Progress Notes (Signed)
Denies pain. Denies skin changes of left/treated breast. Denies fatigue. Given radiaplex and alra then, educated upon use. Patient verbalized understanding.

## 2012-11-29 ENCOUNTER — Ambulatory Visit
Admission: RE | Admit: 2012-11-29 | Discharge: 2012-11-29 | Disposition: A | Discharge status: Home / Self Care | Payer: Medicare Other | Source: Ambulatory Visit | Attending: Radiation Oncology | Admitting: Radiation Oncology

## 2012-11-29 MED ORDER — ALRA NON-METALLIC DEODORANT (RAD-ONC)
1.0000 "application " | Freq: Once | TOPICAL | Status: AC
  Administered 2012-11-29: 1 via TOPICAL

## 2012-11-29 MED ORDER — RADIAPLEXRX EX GEL
Freq: Once | CUTANEOUS | Status: AC
  Administered 2012-11-29: 10:00:00 via TOPICAL

## 2012-11-29 NOTE — Progress Notes (Signed)
Late entry from 11/28/2012 at 0909. Oriented patient to staff and routine of the clinic. Provided patient with RADIATION THERAPY AND YOU handbook then, reviewed the pertinent information. Educated patient on potential side effects and management such as, fatigue and skin changes. Provided patient with radiaplex gel and alra then, educated upon use. Encouraged patient to contact staff with future needs. All questions answered. Patient verbalized understanding of all reviewed.

## 2012-11-29 NOTE — Addendum Note (Signed)
Encounter addended by: Agnes Lawrence, RN on: 11/29/2012 10:07 AM<BR>     Documentation filed: Chief Complaint Section, Notes Section, Inpatient MAR, Inpatient Document Flowsheet, Inpatient Patient Education, Orders

## 2012-11-30 ENCOUNTER — Ambulatory Visit
Admission: RE | Admit: 2012-11-30 | Discharge: 2012-11-30 | Disposition: A | Discharge status: Home / Self Care | Payer: Medicare Other | Source: Ambulatory Visit | Attending: Radiation Oncology | Admitting: Radiation Oncology

## 2012-12-01 ENCOUNTER — Ambulatory Visit
Admission: RE | Admit: 2012-12-01 | Discharge: 2012-12-01 | Disposition: A | Discharge status: Home / Self Care | Payer: Medicare Other | Source: Ambulatory Visit | Attending: Radiation Oncology | Admitting: Radiation Oncology

## 2012-12-02 ENCOUNTER — Ambulatory Visit
Admission: RE | Admit: 2012-12-02 | Discharge: 2012-12-02 | Disposition: A | Discharge status: Home / Self Care | Payer: Medicare Other | Source: Ambulatory Visit | Attending: Radiation Oncology | Admitting: Radiation Oncology

## 2012-12-05 ENCOUNTER — Encounter: Payer: Self-pay | Admitting: Radiation Oncology

## 2012-12-05 ENCOUNTER — Ambulatory Visit
Admission: RE | Admit: 2012-12-05 | Discharge: 2012-12-05 | Disposition: A | Discharge status: Home / Self Care | Payer: Medicare Other | Source: Ambulatory Visit | Attending: Radiation Oncology | Admitting: Radiation Oncology

## 2012-12-05 VITALS — BP 135/81 | HR 64 | Temp 97.4°F | Resp 20 | Wt 133.7 lb

## 2012-12-05 DIAGNOSIS — C50912 Malignant neoplasm of unspecified site of left female breast: Secondary | ICD-10-CM

## 2012-12-05 NOTE — Progress Notes (Signed)
Weekly Management Note:  Site: Left breast Current Dose:  2500  cGy Projected Dose: 4250  cGy followed by boost  Narrative: The patient is seen today for routine under treatment assessment. CBCT/MVCT images/port films were reviewed. The chart was reviewed.   She has had a mild cough or productive sputum and a "runny nose" for the past week. She believes that this is secondary to allergies. No fever. She uses Radioplex gel.  Physical Examination:  Filed Vitals:   12/05/12 0852  BP: 135/81  Pulse: 64  Temp: 97.4 F (36.3 C)  Resp: 20  .  Weight: 133 lb 11.2 oz (60.646 kg). Mild erythema along left breast with no significant skin changes.  Impression: Tolerating radiation therapy well.  Plan: Continue radiation therapy as planned.

## 2012-12-05 NOTE — Progress Notes (Signed)
Electron beam simulation note: The patient underwent virtual simulation for her left breast boost. She was set up en face to her left breast. One custom block was constructed to conform the field. A special port plan is requested. I prescribing 750 cGy in 3 sessions utilizing 9 MEV electrons. The dose is prescribed at the 90% isodose curve.

## 2012-12-05 NOTE — Progress Notes (Addendum)
Pt reports last week she had "a cough off and on", but she noticed it increased over the weekend, occasionally productive w/yellow sputum. Pt denies sore throat, fever, headache. She states she has hx of allergies, takes Chlor tabs bid for allergies.  Pt denies pain, fatigue, loss of appetite. She is applying Radiaplex to left breast tx area; no skin changes reported.

## 2012-12-06 ENCOUNTER — Ambulatory Visit
Admission: RE | Admit: 2012-12-06 | Discharge: 2012-12-06 | Disposition: A | Discharge status: Home / Self Care | Payer: Medicare Other | Source: Ambulatory Visit | Attending: Radiation Oncology | Admitting: Radiation Oncology

## 2012-12-07 ENCOUNTER — Ambulatory Visit
Admission: RE | Admit: 2012-12-07 | Discharge: 2012-12-07 | Disposition: A | Discharge status: Home / Self Care | Payer: Medicare Other | Source: Ambulatory Visit | Attending: Radiation Oncology | Admitting: Radiation Oncology

## 2012-12-08 ENCOUNTER — Ambulatory Visit
Admission: RE | Admit: 2012-12-08 | Discharge: 2012-12-08 | Disposition: A | Discharge status: Home / Self Care | Payer: Medicare Other | Source: Ambulatory Visit | Attending: Radiation Oncology | Admitting: Radiation Oncology

## 2012-12-09 ENCOUNTER — Ambulatory Visit
Admission: RE | Admit: 2012-12-09 | Discharge: 2012-12-09 | Disposition: A | Discharge status: Home / Self Care | Payer: Medicare Other | Source: Ambulatory Visit | Attending: Radiation Oncology | Admitting: Radiation Oncology

## 2012-12-12 ENCOUNTER — Ambulatory Visit
Admission: RE | Admit: 2012-12-12 | Discharge: 2012-12-12 | Disposition: A | Discharge status: Home / Self Care | Payer: Medicare Other | Source: Ambulatory Visit | Attending: Radiation Oncology | Admitting: Radiation Oncology

## 2012-12-12 ENCOUNTER — Encounter: Payer: Self-pay | Admitting: Radiation Oncology

## 2012-12-12 VITALS — BP 138/57 | HR 66 | Temp 97.7°F | Resp 20 | Wt 136.7 lb

## 2012-12-12 DIAGNOSIS — C50912 Malignant neoplasm of unspecified site of left female breast: Secondary | ICD-10-CM

## 2012-12-12 MED ORDER — RADIAPLEXRX EX GEL
Freq: Once | CUTANEOUS | Status: AC
  Administered 2012-12-12: 10:00:00 via TOPICAL

## 2012-12-12 NOTE — Progress Notes (Signed)
Weekly rad txs, left breast,15/20 completed, no skin changes, using radiaqpl;ex bid, fair appetite, some fatigue 9:03 AM

## 2012-12-12 NOTE — Progress Notes (Signed)
   Weekly Management Note:  outpatient Current Dose:  37.5 Gy  Projected Dose: 42.5 Gy + boost   Narrative:  The patient presents for routine under treatment assessment.  CBCT/MVCT images/Port film x-rays were reviewed.  The chart was checked. Doing well. No new complaints   Physical Findings:  weight is 136 lb 11.2 oz (62.007 kg). Her oral temperature is 97.7 F (36.5 C). Her blood pressure is 138/57 and her pulse is 66. Her respiration is 20.  hyperpigmented left breast, inverted nipple. Skin intact.  Impression:  The patient is tolerating radiotherapy.  Plan:  Continue radiotherapy as planned.   ________________________________   Lonie Peak, M.D.

## 2012-12-13 ENCOUNTER — Ambulatory Visit
Admission: RE | Admit: 2012-12-13 | Discharge: 2012-12-13 | Disposition: A | Discharge status: Home / Self Care | Payer: Medicare Other | Source: Ambulatory Visit | Attending: Radiation Oncology | Admitting: Radiation Oncology

## 2012-12-14 ENCOUNTER — Ambulatory Visit
Admission: RE | Admit: 2012-12-14 | Discharge: 2012-12-14 | Disposition: A | Discharge status: Home / Self Care | Payer: Medicare Other | Source: Ambulatory Visit | Attending: Radiation Oncology | Admitting: Radiation Oncology

## 2012-12-15 ENCOUNTER — Ambulatory Visit
Admission: RE | Admit: 2012-12-15 | Discharge: 2012-12-15 | Disposition: A | Discharge status: Home / Self Care | Payer: Medicare Other | Source: Ambulatory Visit | Attending: Radiation Oncology | Admitting: Radiation Oncology

## 2012-12-16 ENCOUNTER — Ambulatory Visit
Admission: RE | Admit: 2012-12-16 | Discharge: 2012-12-16 | Disposition: A | Discharge status: Home / Self Care | Payer: Medicare Other | Source: Ambulatory Visit | Attending: Radiation Oncology | Admitting: Radiation Oncology

## 2012-12-19 ENCOUNTER — Ambulatory Visit
Admission: RE | Admit: 2012-12-19 | Discharge: 2012-12-19 | Disposition: A | Discharge status: Home / Self Care | Payer: Medicare Other | Source: Ambulatory Visit | Attending: Radiation Oncology | Admitting: Radiation Oncology

## 2012-12-19 ENCOUNTER — Encounter: Payer: Self-pay | Admitting: Radiation Oncology

## 2012-12-19 VITALS — BP 130/64 | HR 68 | Resp 16 | Wt 134.6 lb

## 2012-12-19 DIAGNOSIS — C50912 Malignant neoplasm of unspecified site of left female breast: Secondary | ICD-10-CM

## 2012-12-19 NOTE — Progress Notes (Signed)
[facility] Radiation Oncology End of Treatment Note  Name:Brayleigh JOLEEN STUCKERT  Date: 12/19/2012 WUJ:[id number] DOB:12-Mar-1935   Status:outpatient    CC: TODD,JEFFREY ALLEN, MD  Dr. Jaclynn Guarneri  REFERRING PHYSICIAN:   Dr. Jaclynn Guarneri   DIAGNOSIS: Stage I (T1, N0, M0) invasive ductal/DCIS of the left breast    INDICATION FOR TREATMENT: Curative   TREATMENT DATES: 11/22/2012 through 12/19/2012                          SITE/DOSE:   Breast 4250 cGy in 17 sessions. Left breast boost 750 cGy in 3 sessions (hypofractionated)                         BEAMS/ENERGY:   Tangential fields with deep inspiration and breath-hold technology to avoid cardiac irradiation. She was treated with 6 MV photons. Left breast boost with 9 MEV electrons.                NARRATIVE:  Ms. Newhall tolerated treatment well  with no areas of moist desquamation by completion of therapy. During her last week of therapy she developed what I believe to be a contact dermatitis from a skin preparation for which I recommended hydrocortisone cream.                        PLAN: Routine followup in one month. Patient instructed to call if questions or worsening complaints in interim.

## 2012-12-19 NOTE — Progress Notes (Signed)
Weekly Management Note:  Site: Left breast Current Dose:  5000  cGy Projected Dose: 5000  cGy  Narrative: The patient is seen today for routine under treatment assessment. CBCT/MVCT images/port films were reviewed. The chart was reviewed.   She does report a pruritic rash along her lower axilla/lateral left breast. She has been using Radioplex gel.  Physical Examination:  Filed Vitals:   12/19/12 0857  BP: 130/64  Pulse: 68  Resp: 16  .  Weight: 134 lb 9.6 oz (61.054 kg). On inspection of the left breast there is a confluent erythematous rash along the lower axilla and also to the right of midline suggestive of a contact dermatitis or possibly a fungal infection. No areas of desquamation.  Impression: Tolerating radiation therapy well, however, she may have a contact dermatitis, perhaps related to her Radioplex gel. I will have her stop Radioplex gel and start hydrocortisone cream. She is to call me in 2-3 days to give me an update on her rash.  Plan: Radiation therapy completed. She is to call me in 2- 3 days to give me an update on her rash. Followup visit in one month.

## 2012-12-19 NOTE — Progress Notes (Signed)
Left nipple inverted. Hyperpigmentation of left upper chest wall and axilla. Skin intact without desquamation. Reports using radiaplex bid as directed. Encouraged to continue this practice for the next two weeks. Reviewed FYNN and ABC flyers. Patient verbalized understanding. Provided patient with one month follow up appointment card. Scheduled to follow up with Welton Flakes 9/4 and Kelle Darting 10/21.

## 2012-12-20 ENCOUNTER — Ambulatory Visit: Payer: Medicare Other

## 2012-12-21 ENCOUNTER — Ambulatory Visit: Payer: Medicare Other

## 2012-12-22 ENCOUNTER — Ambulatory Visit: Payer: Medicare Other

## 2012-12-23 ENCOUNTER — Ambulatory Visit: Payer: Medicare Other

## 2012-12-26 ENCOUNTER — Ambulatory Visit: Payer: Medicare Other

## 2012-12-27 ENCOUNTER — Ambulatory Visit: Payer: Medicare Other

## 2012-12-28 ENCOUNTER — Ambulatory Visit: Payer: Medicare Other

## 2012-12-29 ENCOUNTER — Ambulatory Visit: Payer: Medicare Other

## 2012-12-30 ENCOUNTER — Ambulatory Visit: Payer: Medicare Other

## 2013-01-03 ENCOUNTER — Ambulatory Visit: Payer: Medicare Other

## 2013-01-03 ENCOUNTER — Encounter: Payer: Self-pay | Admitting: Family Medicine

## 2013-01-04 ENCOUNTER — Ambulatory Visit: Payer: Medicare Other

## 2013-01-05 ENCOUNTER — Encounter: Payer: Self-pay | Admitting: Oncology

## 2013-01-05 ENCOUNTER — Ambulatory Visit (HOSPITAL_BASED_OUTPATIENT_CLINIC_OR_DEPARTMENT_OTHER): Payer: Medicare Other | Admitting: Oncology

## 2013-01-05 ENCOUNTER — Ambulatory Visit: Payer: Medicare Other

## 2013-01-05 VITALS — BP 138/80 | HR 76 | Temp 97.9°F | Resp 20 | Ht 62.0 in | Wt 135.7 lb

## 2013-01-05 DIAGNOSIS — M899 Disorder of bone, unspecified: Secondary | ICD-10-CM

## 2013-01-05 DIAGNOSIS — C50919 Malignant neoplasm of unspecified site of unspecified female breast: Secondary | ICD-10-CM

## 2013-01-05 DIAGNOSIS — E559 Vitamin D deficiency, unspecified: Secondary | ICD-10-CM

## 2013-01-05 DIAGNOSIS — C50912 Malignant neoplasm of unspecified site of left female breast: Secondary | ICD-10-CM

## 2013-01-05 DIAGNOSIS — M8580 Other specified disorders of bone density and structure, unspecified site: Secondary | ICD-10-CM

## 2013-01-05 MED ORDER — ANASTROZOLE 1 MG PO TABS: 1.0000 mg | ORAL_TABLET | Freq: Every day | ORAL | Status: AC

## 2013-01-05 NOTE — Patient Instructions (Addendum)
Proceed with anti-estrogen today with arimidex (anastrozole) 1 mg daily  We discussed the side effects and more information is below  We ordered a bone density scan   I will see you back in 3 months  Anastrozole tablets What is this medicine? ANASTROZOLE (an AS troe zole) is used to treat breast cancer in women who have gone through menopause. Some types of breast cancer depend on estrogen to grow, and this medicine can stop tumor growth by blocking estrogen production. This medicine may be used for other purposes; ask your health care provider or pharmacist if you have questions. What should I tell my health care provider before I take this medicine? They need to know if you have any of these conditions: -liver disease -an unusual or allergic reaction to anastrozole, other medicines, foods, dyes, or preservatives -pregnant or trying to get pregnant -breast-feeding How should I use this medicine? Take this medicine by mouth with a glass of water. Follow the directions on the prescription label. You can take this medicine with or without food. Take your doses at regular intervals. Do not take your medicine more often than directed. Do not stop taking except on the advice of your doctor or health care professional. Talk to your pediatrician regarding the use of this medicine in children. Special care may be needed. Overdosage: If you think you have taken too much of this medicine contact a poison control center or emergency room at once. NOTE: This medicine is only for you. Do not share this medicine with others. What if I miss a dose? If you miss a dose, take it as soon as you can. If it is almost time for your next dose, take only that dose. Do not take double or extra doses. What may interact with this medicine? Do not take this medicine with any of the following medications: -female hormones, like estrogens or progestins and birth control pills This medicine may also interact with the  following medications: -tamoxifen This list may not describe all possible interactions. Give your health care provider a list of all the medicines, herbs, non-prescription drugs, or dietary supplements you use. Also tell them if you smoke, drink alcohol, or use illegal drugs. Some items may interact with your medicine. What should I watch for while using this medicine? Visit your doctor or health care professional for regular checks on your progress. Let your doctor or health care professional know about any unusual vaginal bleeding. Do not treat yourself for diarrhea, nausea, vomiting or other side effects. Ask your doctor or health care professional for advice. What side effects may I notice from receiving this medicine? Side effects that you should report to your doctor or health care professional as soon as possible: -allergic reactions like skin rash, itching or hives, swelling of the face, lips, or tongue -any new or unusual symptoms -breathing problems -chest pain -leg pain or swelling -vomiting Side effects that usually do not require medical attention (report to your doctor or health care professional if they continue or are bothersome): -back or bone pain -cough, or throat infection -diarrhea or constipation -dizziness -headache -hot flashes -loss of appetite -nausea -sweating -weakness and tiredness -weight gain This list may not describe all possible side effects. Call your doctor for medical advice about side effects. You may report side effects to FDA at 1-800-FDA-1088. Where should I keep my medicine? Keep out of the reach of children. Store at room temperature between 20 and 25 degrees C (68 and 77 degrees  F). Throw away any unused medicine after the expiration date. NOTE: This sheet is a summary. It may not cover all possible information. If you have questions about this medicine, talk to your doctor, pharmacist, or health care provider.  2012, Elsevier/Gold Standard.  (07/01/2007 4:31:52 PM)

## 2013-01-06 ENCOUNTER — Ambulatory Visit: Payer: Medicare Other

## 2013-01-13 ENCOUNTER — Encounter: Payer: Self-pay | Admitting: Radiation Oncology

## 2013-01-18 ENCOUNTER — Encounter: Payer: Self-pay | Admitting: Radiation Oncology

## 2013-01-18 ENCOUNTER — Ambulatory Visit
Admission: RE | Admit: 2013-01-18 | Discharge: 2013-01-18 | Disposition: A | Discharge status: Home / Self Care | Payer: Medicare Other | Source: Ambulatory Visit | Attending: Radiation Oncology | Admitting: Radiation Oncology

## 2013-01-18 VITALS — BP 132/75 | HR 109 | Temp 97.6°F | Resp 20 | Wt 135.9 lb

## 2013-01-18 DIAGNOSIS — C50912 Malignant neoplasm of unspecified site of left female breast: Secondary | ICD-10-CM

## 2013-01-18 NOTE — Progress Notes (Signed)
Pt denies pain, fatigue, loss of appetite. She states her skin has healed except around her nipple. Advised she apply lotion w/vitamin E. Pt taking Anastrozole daily.

## 2013-01-18 NOTE — Progress Notes (Signed)
Followup note:  Adrienne [name] returns today approximately 1 month following completion of radiation therapy following conservative surgery in the management of her T1 N0 invasive ductal/DCIS of the left breast. She receive hypo-fractionated radiation therapy. She was placed on anastrozole by Dr. Welton Flakes will see her back for a followup visit in December. She is without complaints today. She is busy driving her 76 year old grandson around town to play soccer. She is excited about her granddaughter getting married in early October.  Physical examination: Alert and oriented. Filed Vitals:   01/18/13 0858  BP: 132/75  Pulse: 109  Temp: 97.6 F (36.4 C)  Resp: 20   Head and neck examination: Grossly unremarkable. Nodes: Without palpable cervical, supraclavicular, or axillary lymphadenopathy. Chest: Lungs clear. Breasts: There is residual hyperpigmentation the skin along the left breast with mild to moderate thickening. No dominant masses are appreciated. Right breast without masses or lesions.  Impression: Satisfactory progress.  Plan: We'll follow up with Dr. Welton Flakes in December. I think she can wait until April 2015 to have bilateral mammography.

## 2013-01-20 NOTE — Progress Notes (Signed)
OFFICE PROGRESS NOTE  CC  TODD,JEFFREY [name], MD [address] [place] [address] Dr. Glenna Fellows  Dr. Chipper Herb  DIAGNOSIS: 78 year old female with new diagnosis of T1 N0 invasive ductal carcinoma of the left breast status post lumpectomy and sentinel lymph node biopsy.  STAGE:  Left breast  Invasive ductal carcinoma  ER positive100% PR positive 88%HER-2/neu negative  Ki-67 16%  T1 N0( stage I)  Status post lumpectomy with sentinel lymph node biopsy on 09/12/2012  PRIOR THERAPY: #1routine screening mammogram performed that showed an abnormality in the left breastand this was confirmed by ultrasound.her mammograms were done at [facility].she went on to have a core needle biopsy performed that showed invasive ductal carcinoma with associated DCIS and calcifications. Tumor was ER positive PR positive HER-2/neu negative with Ki-67 of 18%. Patient had MRI of the breasts performed on 08/29/2012 the MRI revealed  Within the upper central portion of the left breast enhancing mass measuring 1.2 x 1.2 x 1.2 cm. No others suspicious abnormalities were found.she went on to have a lumpectomy with sentinel lymph node biopsy on 09/12/2012. The final pathology revealed a 0.8 cm invasive ductal carcinoma grade 1 measuring 0.8 cm ER +100% PR +88% HER-2/neu negative Ki-67 16%. One sentinel node was negative for metastatic disease. She did have a focal close margin of DCIS at less than 1 mm  #2 patient underwent radiation therapy starting 11/09/2012 through 12/19/2012 overall she tolerated it well  #3 begin antiestrogen therapy with anastrozole curative intent 1 mg daily for 5 years we discussed side effects benefits and rationale for this.  CURRENT THERAPY:anastrozole 1 mg daily  INTERVAL HISTORY: Adrienne [name] 78 y.o. female returns for followup visit today. Overall she's doing well. For the most part she tolerated radiation without any significant problems. She denies any fevers  chills night sweats headaches no shortness of breath chest pains palpitations, no myalgias and arthralgias. No easy bruising no bleeding. Remainder of the 10 point review of systems is negative.  MEDICAL HISTORY: Past Medical History  Diagnosis Date  . Hyperlipidemia   . Hypertension     Does not see a cardiologist  . History of frequent urinary tract infections   . Breast cancer 08/25/12    Invasive ductal ca,DCIS  . Allergy   . Diabetes mellitus     type II  . Hearing loss   . Gout   . Osteopenia   . Hx of radiation therapy 11/22/12- 12/19/12    breast 4250 cGy 17 sessions, left breast boost 750 cGy 3 sessions    ALLERGIES:  is allergic to penicillins and radiaplexrx.  MEDICATIONS:  Current Outpatient Prescriptions  Medication Sig Dispense Refill  . ACCU-CHEK FASTCLIX LANCETS MISC 1 each by Does not apply route daily.  102 each  3  . allopurinol (ZYLOPRIM) 300 MG tablet Take 1 tablet (300 mg total) by mouth daily.  90 tablet  1  . aspirin 81 MG tablet Take 81 mg by mouth daily.      Marland Kitchen atenolol-chlorthalidone (TENORETIC) 50-25 MG per tablet Take 0.5 tablets by mouth daily.  90 tablet  1  . calcium-vitamin D 250-100 MG-UNIT per tablet Take 1 tablet by mouth 2 (two) times daily.      . Chlorpheniramine Maleate (CHLOR-TABLETS PO) Take by mouth.      Marland Kitchen glucose blood (ACCU-CHEK AVIVA) test strip 1 each by Other route 2 (two) times daily. Dx 250.00  100 each  12  . metFORMIN (GLUCOPHAGE) 500 MG tablet  Take 1 tablet (500 mg total) by mouth 2 (two) times daily with a meal.  200 tablet  3  . Multiple Vitamins-Minerals (CENTRUM SILVER) tablet Take 1 tablet by mouth daily.        . non-metallic deodorant Thornton Papas) MISC Apply 1 application topically daily as needed.      Marland Kitchen OVER THE COUNTER MEDICATION Take by mouth 2 (two) times daily. Presavision- for macular degeneration prevention      . potassium chloride SA (K-DUR,KLOR-CON) 20 MEQ tablet Take 1 tablet (20 mEq total) by mouth 2 (two) times  daily.  180 tablet  1  . simvastatin (ZOCOR) 40 MG tablet Take 40 mg by mouth every evening.      Marland Kitchen anastrozole (ARIMIDEX) 1 MG tablet Take 1 tablet (1 mg total) by mouth daily.  90 tablet  12  . fish oil-omega-3 fatty acids 1000 MG capsule Take 1 g by mouth daily.      Marland Kitchen HYDROcodone-acetaminophen (NORCO/VICODIN) 5-325 MG per tablet Take 1-2 tablets by mouth every 4 (four) hours as needed for pain.  25 tablet  1   No current facility-administered medications for this visit.    SURGICAL HISTORY:  Past Surgical History  Procedure Laterality Date  . Appendectomy    . Cholecystectomy    . Total abdominal hysterectomy w/ bilateral salpingoophorectomy    . Breast surgery  1990's    right- fibroid cyst  . Septoplasty    . Breast lumpectomy with needle localization and axillary sentinel lymph node bx Left 09/12/2012    Procedure: LEFT NEEDLE LOCALIZATION BREAST LUMPECTOMY TION AND AXILLARY SENTINEL LYMPH NODE BX;  Surgeon: Mariella Saa, MD;  Location: [facility] OR;  Service: General;  Laterality: Left;  . Abdominal hysterectomy Bilateral 1999    w/b/l salpingo-oopherectomy    REVIEW OF SYSTEMS:  Pertinent items are noted in HPI.   HEALTH MAINTENANCE:  PHYSICAL EXAMINATION: Blood pressure 138/80, pulse 76, temperature 97.9 F (36.6 C), temperature source Oral, resp. rate 20, height 5\' 2"  (1.575 m), weight 135 lb 11.2 oz (61.553 kg). Body mass index is 24.81 kg/(m^2). ECOG PERFORMANCE STATUS: 0 - Asymptomatic   General appearance: alert, cooperative and appears stated age Neck: no adenopathy, no carotid bruit, no JVD, supple, symmetrical, trachea midline and thyroid not enlarged, symmetric, no tenderness/mass/nodules Lymph nodes: Cervical, supraclavicular, and axillary nodes normal. Resp: clear to auscultation bilaterally Back: symmetric, no curvature. ROM normal. No CVA tenderness. Cardio: regular rate and rhythm GI: soft, non-tender; bowel sounds normal; no masses,  no  organomegaly Extremities: extremities normal, atraumatic, no cyanosis or edema Neurologic: Alert and oriented X 3, normal strength and tone. Normal symmetric reflexes. Normal coordination and gait Breast examination no masses nipple discharge left breast with well-healed surgical scar  LABORATORY DATA: Lab Results  Component Value Date   WBC 8.5 09/20/2012   HGB 14.1 09/20/2012   HCT 42.6 09/20/2012   MCV 93.9 09/20/2012   PLT 211 09/20/2012      Chemistry      Component Value Date/Time   NA 140 09/20/2012 1302   NA 141 09/20/2012 0852   K 3.6 09/20/2012 1302   K 4.4 09/20/2012 0852   CL 104 09/20/2012 1302   CL 104 09/20/2012 0852   CO2 27 09/20/2012 1302   CO2 28 09/20/2012 0852   BUN 29.8* 09/20/2012 1302   BUN 30* 09/20/2012 0852   CREATININE 1.3* 09/20/2012 1302   CREATININE 1.2 09/20/2012 0852      Component Value Date/Time  CALCIUM 9.3 09/20/2012 1302   CALCIUM 9.7 09/20/2012 0852   ALKPHOS 74 09/20/2012 1302   ALKPHOS 74 09/08/2012 0935   AST 16 09/20/2012 1302   AST 25 09/08/2012 0935   ALT 17 09/20/2012 1302   ALT 20 09/08/2012 0935   BILITOT 0.49 09/20/2012 1302   BILITOT 0.5 09/08/2012 0935       RADIOGRAPHIC STUDIES:  No results found.  ASSESSMENT: 89-year-old female with  #1 new diagnosis of invasive ductal carcinoma with calcifications grade 1 measuring 0.8 cm with low grade DCIS. One sentinel node was negative for metastatic disease. Tumor was ER positive HER-2/neu negative with a low Ki-67. Patient underwent a lumpectomy. Postoperatively she did well and went on to have radiation therapy which he completed 12/19/2012. She tolerated this well.  #2 patient will begin antiestrogen therapy curative intent with arimidex 1 milligram daily. We discussed side effects and benefits of treatment and the rationale to her. Full information and literature was given to her. Prescription was sent to her pharmacy. Patient demonstrated understanding of her treatment.   PLAN:    #1proceed  with Arimidex 1 mg daily.  #2 she will return in 3 months time for followup to evaluate side effects. However she knows to call me sooner if problems occur   All questions were answered. The patient knows to call the clinic with any problems, questions or concerns. We can certainly see the patient much sooner if necessary.  I spent 25 minutes counseling the patient face to face. The total time spent in the appointment was 30 minutes.    Drue Second, MD Medical/Oncology [facility] [phone] (beeper) [phone] (Office)

## 2013-02-21 ENCOUNTER — Encounter: Payer: Self-pay | Admitting: Family Medicine

## 2013-03-16 ENCOUNTER — Ambulatory Visit (INDEPENDENT_AMBULATORY_CARE_PROVIDER_SITE_OTHER): Payer: Medicare Other | Admitting: Family Medicine

## 2013-03-16 ENCOUNTER — Encounter: Payer: Self-pay | Admitting: Lab

## 2013-03-16 ENCOUNTER — Encounter: Payer: Self-pay | Admitting: Family Medicine

## 2013-03-16 VITALS — BP 120/74 | Temp 97.5°F | Ht 61.5 in | Wt 137.0 lb

## 2013-03-16 DIAGNOSIS — C50912 Malignant neoplasm of unspecified site of left female breast: Secondary | ICD-10-CM

## 2013-03-16 DIAGNOSIS — Z0000 Encounter for general adult medical examination without abnormal findings: Secondary | ICD-10-CM

## 2013-03-16 DIAGNOSIS — I10 Essential (primary) hypertension: Secondary | ICD-10-CM

## 2013-03-16 DIAGNOSIS — E785 Hyperlipidemia, unspecified: Secondary | ICD-10-CM

## 2013-03-16 DIAGNOSIS — N952 Postmenopausal atrophic vaginitis: Secondary | ICD-10-CM

## 2013-03-16 DIAGNOSIS — C50919 Malignant neoplasm of unspecified site of unspecified female breast: Secondary | ICD-10-CM

## 2013-03-16 DIAGNOSIS — E119 Type 2 diabetes mellitus without complications: Secondary | ICD-10-CM

## 2013-03-16 LAB — MICROALBUMIN / CREATININE URINE RATIO: Microalb Creat Ratio: 0.1 mg/g (ref 0.0–30.0)

## 2013-03-16 MED ORDER — ESTROGENS, CONJUGATED 0.625 MG/GM VA CREA: TOPICAL_CREAM | VAGINAL | Status: DC

## 2013-03-16 MED ORDER — SIMVASTATIN 40 MG PO TABS: 40.0000 mg | ORAL_TABLET | Freq: Every evening | ORAL | Status: DC

## 2013-03-16 MED ORDER — ATENOLOL 12.5 MG HALF TABLET: ORAL_TABLET | ORAL | Status: DC

## 2013-03-16 NOTE — Patient Instructions (Signed)
Stop the Tenoretic and potassium  Tenormin 12.5 mg.....Marland Kitchen one tablet daily in the morning  Use small amounts of the hormonal cream twice weekly  Return in one year sooner if any problems  Continue your other medications

## 2013-03-16 NOTE — Progress Notes (Signed)
  Subjective:    Patient ID: Adrienne [name], female    DOB: 07-Nov-1934, 78 y.o.   MRN: [id number]  HPI Adrienne [name] is a 78 year old female nonsmoker who comes in today for annual Medicare wellness examination because of a history of diabetes type 2 well controlled with metformin 500 twice a day, hypertension well controlled with Tenoretic 25-12.5 daily,,,,,,, asked her blood pressures too low is 120/74,,,,,,,,,,,,, gout on allopurinol 300 mg daily.  Last spring she'll routine mammogram was found to have breast cancer 12:00 position left breast just above the nipple. She underwent a lumpectomy subsequent postop radiation. She's done well and sent no complications. She's followed by general surgery and oncology. She's on an estrogen suppressor product  She gets routine eye care, dental care, BSE monthly, and you mammography, colonoscopy and GI,  Cognitive function normal she walks on a regular basis home health safety reviewed no issues identified, no guns in the house, she does have a health care power of attorney and living will   Review of Systems  Constitutional: Negative.   HENT: Negative.   Eyes: Negative.   Respiratory: Negative.   Cardiovascular: Negative.   Gastrointestinal: Negative.   Genitourinary: Negative.   Musculoskeletal: Negative.   Neurological: Negative.   Psychiatric/Behavioral: Negative.        Objective:   Physical Exam  Constitutional: She appears well-developed and well-nourished.  HENT:  Head: Normocephalic and atraumatic.  Right Ear: External ear normal.  Left Ear: External ear normal.  Nose: Nose normal.  Mouth/Throat: Oropharynx is clear and moist.  Eyes: EOM are normal. Pupils are equal, round, and reactive to light.  Neck: Normal range of motion. Neck supple. No thyromegaly present.  Cardiovascular: Normal rate, regular rhythm, normal heart sounds and intact distal pulses.  Exam reveals no gallop and no friction rub.   No murmur heard. Pulmonary/Chest:  Effort normal and breath sounds normal.  Abdominal: Soft. Bowel sounds are normal. She exhibits no distension and no mass. There is no tenderness. There is no rebound.  Genitourinary: Vagina normal.  Bilateral breast exam normal except for scar 12:00 left breast right above the nipple from previous lumpectomy and tattoo marks from previous radiation  Her uterus and ovaries have been surgically removed. Pelvic exam normal except for severe dryness and cracking and erythema of the labia and in the vagina around urethra.  Musculoskeletal: Normal range of motion.  Lymphadenopathy:    She has no cervical adenopathy.  Neurological: She is alert. She has normal reflexes. No cranial nerve deficit. She exhibits normal muscle tone. Coordination normal.  Skin: Skin is warm and dry.  Psychiatric: She has a normal mood and affect. Her behavior is normal. Judgment and thought content normal.          Assessment & Plan:  Healthy female  Hypertension BP too low decrease medication to 12.5 mg daily  History of gout continue allopurinol  Diabetes type 2 check A1c continue metformin 500 twice a day  Status post breast cancer left breast continue followup by oncology

## 2013-03-21 ENCOUNTER — Telehealth: Payer: Self-pay | Admitting: Family Medicine

## 2013-03-21 NOTE — Telephone Encounter (Signed)
Pt is at home now and can accept your call

## 2013-03-22 NOTE — Telephone Encounter (Signed)
Left message on machine for patient returning our call 

## 2013-03-24 NOTE — Telephone Encounter (Signed)
Patient is aware of lab results.

## 2013-03-27 ENCOUNTER — Other Ambulatory Visit: Payer: Self-pay | Admitting: Family Medicine

## 2013-04-06 ENCOUNTER — Ambulatory Visit (INDEPENDENT_AMBULATORY_CARE_PROVIDER_SITE_OTHER): Payer: Medicare Other | Admitting: General Surgery

## 2013-04-07 ENCOUNTER — Ambulatory Visit (INDEPENDENT_AMBULATORY_CARE_PROVIDER_SITE_OTHER): Payer: Medicare Other | Admitting: General Surgery

## 2013-04-07 ENCOUNTER — Encounter (INDEPENDENT_AMBULATORY_CARE_PROVIDER_SITE_OTHER): Payer: Self-pay | Admitting: General Surgery

## 2013-04-07 VITALS — BP 124/86 | HR 84 | Temp 98.4°F | Resp 16 | Ht 61.5 in | Wt 136.0 lb

## 2013-04-07 DIAGNOSIS — C50912 Malignant neoplasm of unspecified site of left female breast: Secondary | ICD-10-CM

## 2013-04-07 DIAGNOSIS — C50919 Malignant neoplasm of unspecified site of unspecified female breast: Secondary | ICD-10-CM

## 2013-04-07 NOTE — Progress Notes (Signed)
Chief complaint: Followup cancer left breast  History: Patient returns for long-term followup status post left breast lumpectomy and negative sentinel lymph node biopsy, radiation therapy and now on adjuvant Arimidex for stage IA invasive ductal carcinoma, ER positive. Primary tumor was 8 mm with associated DCIS that was close, 1 mm to one margin focally. She has completed her radiation therapy without difficulty. She is now on Arimidex and initially had diarrhea that is now improving. She denies any changes in her breasts specifically skin changes or lump or nipple discharge or pain. She had a unilateral left mammogram in July prior to beginning her radiation but showed no residual calcifications.  Exam: BP 124/86  Pulse 84  Temp(Src) 98.4 F (36.9 C) (Temporal)  Resp 16  Ht 5' 1.5" (1.562 m)  Wt 136 lb (61.689 kg)  BMI 25.28 kg/m2 General: Elderly Caucasian female appears well Skin: No rash or infection Lymph nodes: No cervical, supraclavicular or axillary nodes palpable Lungs: Clear equal breath sounds bilaterally Breasts: Well-healed lumpectomy incision upper-outer left breast. Minimal post radiation changes. No palpable masses in either breast particular attention to the lumpectomy site and again no palpable axillary adenopathy  Assessment and plan: Doing well following treatment for stage IA cancer left breast as above. No evidence of early recurrence or complications for treatment. Return in 6 months

## 2013-04-14 ENCOUNTER — Telehealth: Payer: Self-pay | Admitting: Oncology

## 2013-04-14 ENCOUNTER — Other Ambulatory Visit (HOSPITAL_BASED_OUTPATIENT_CLINIC_OR_DEPARTMENT_OTHER): Payer: Medicare Other

## 2013-04-14 ENCOUNTER — Encounter: Payer: Self-pay | Admitting: Oncology

## 2013-04-14 ENCOUNTER — Ambulatory Visit (HOSPITAL_BASED_OUTPATIENT_CLINIC_OR_DEPARTMENT_OTHER): Payer: Medicare Other | Admitting: Oncology

## 2013-04-14 VITALS — BP 144/83 | HR 75 | Temp 98.7°F | Resp 17 | Ht 61.0 in | Wt 136.7 lb

## 2013-04-14 DIAGNOSIS — C50912 Malignant neoplasm of unspecified site of left female breast: Secondary | ICD-10-CM

## 2013-04-14 DIAGNOSIS — C50119 Malignant neoplasm of central portion of unspecified female breast: Secondary | ICD-10-CM

## 2013-04-14 DIAGNOSIS — Z170 Estrogen receptor positive status [ER+]: Secondary | ICD-10-CM

## 2013-04-14 DIAGNOSIS — C50919 Malignant neoplasm of unspecified site of unspecified female breast: Secondary | ICD-10-CM

## 2013-04-14 DIAGNOSIS — E559 Vitamin D deficiency, unspecified: Secondary | ICD-10-CM

## 2013-04-14 LAB — CBC WITH DIFFERENTIAL/PLATELET
BASO%: 0.9 % (ref 0.0–2.0)
Basophils Absolute: 0.1 10*3/uL (ref 0.0–0.1)
EOS%: 3.4 % (ref 0.0–7.0)
LYMPH%: 24.4 % (ref 14.0–49.7)
MCH: 31.6 pg (ref 25.1–34.0)
MCHC: 32.6 g/dL (ref 31.5–36.0)
MCV: 96.7 fL (ref 79.5–101.0)
MONO%: 11.3 % (ref 0.0–14.0)
NEUT%: 60 % (ref 38.4–76.8)
RDW: 13.9 % (ref 11.2–14.5)
lymph#: 1.5 10*3/uL (ref 0.9–3.3)

## 2013-04-14 LAB — COMPREHENSIVE METABOLIC PANEL (CC13)
ALT: 18 U/L (ref 0–55)
AST: 19 U/L (ref 5–34)
Alkaline Phosphatase: 70 U/L (ref 40–150)
Anion Gap: 10 mEq/L (ref 3–11)
BUN: 27.4 mg/dL — ABNORMAL HIGH (ref 7.0–26.0)
CO2: 24 mEq/L (ref 22–29)
Calcium: 10 mg/dL (ref 8.4–10.4)
Chloride: 106 mEq/L (ref 98–109)
Creatinine: 1 mg/dL (ref 0.6–1.1)

## 2013-04-14 NOTE — Telephone Encounter (Signed)
, °

## 2013-04-14 NOTE — Progress Notes (Signed)
OFFICE PROGRESS NOTE  CC  TODD,JEFFREY Adrienne Busman, MD [address] [place] [address] Dr. Glenna Fellows  Dr. Chipper Herb  DIAGNOSIS: 78 year old female with new diagnosis of T1 N0 invasive ductal carcinoma of the left breast status post lumpectomy and sentinel lymph node biopsy.  STAGE:  Left breast  Invasive ductal carcinoma  ER positive100% PR positive 88%HER-2/neu negative  Ki-67 16%  T1 N0( stage I)  Status post lumpectomy with sentinel lymph node biopsy on 09/12/2012  PRIOR THERAPY: #1routine screening mammogram performed that showed an abnormality in the left breastand this was confirmed by ultrasound.her mammograms were done at [facility].she went on to have a core needle biopsy performed that showed invasive ductal carcinoma with associated DCIS and calcifications. Tumor was ER positive PR positive HER-2/neu negative with Ki-67 of 18%. Patient had MRI of the breasts performed on 08/29/2012 the MRI revealed  Within the upper central portion of the left breast enhancing mass measuring 1.2 x 1.2 x 1.2 cm. No others suspicious abnormalities were found.she went on to have a lumpectomy with sentinel lymph node biopsy on 09/12/2012. The final pathology revealed a 0.8 cm invasive ductal carcinoma grade 1 measuring 0.8 cm ER +100% PR +88% HER-2/neu negative Ki-67 16%. One sentinel node was negative for metastatic disease. She did have a focal close margin of DCIS at less than 1 mm  #2 patient underwent radiation therapy starting 11/09/2012 through 12/19/2012 overall she tolerated it well  #3 begin antiestrogen therapy with anastrozole curative intent 1 mg daily for 5 years we discussed side effects benefits and rationale for this.  CURRENT THERAPY:anastrozole 1 mg daily  INTERVAL HISTORY: Adrienne [name] 78 y.o. female returns for followup visit today. Overall she's doing well. She is tolerating the Arimidex very nicely. She denies any fevers chills night sweats headaches no  shortness of breath chest pains palpitations, no myalgias and arthralgias. No easy bruising no bleeding. Remainder of the 10 point review of systems is negative.  MEDICAL HISTORY: Past Medical History  Diagnosis Date  . Hyperlipidemia   . Hypertension     Does not see a cardiologist  . History of frequent urinary tract infections   . Breast cancer 08/25/12    Invasive ductal ca,DCIS  . Allergy   . Diabetes mellitus     type II  . Hearing loss   . Gout   . Osteopenia   . Hx of radiation therapy 11/22/12- 12/19/12    breast 4250 cGy 17 sessions, left breast boost 750 cGy 3 sessions    ALLERGIES:  is allergic to penicillins and radiaplexrx.  MEDICATIONS:  Current Outpatient Prescriptions  Medication Sig Dispense Refill  . ACCU-CHEK FASTCLIX LANCETS MISC 1 each by Does not apply route daily.  102 each  3  . allopurinol (ZYLOPRIM) 300 MG tablet Take 1 tablet (300 mg total) by mouth daily.  90 tablet  1  . anastrozole (ARIMIDEX) 1 MG tablet       . aspirin 81 MG tablet Take 81 mg by mouth daily.      Marland Kitchen atenolol (TENORMIN) 12.5 mg TABS tablet 1 by mouth every morning  100 tablet  3  . calcium-vitamin D 250-100 MG-UNIT per tablet Take 1 tablet by mouth 2 (two) times daily.      . Chlorpheniramine Maleate (CHLOR-TABLETS PO) Take by mouth.      . conjugated estrogens (PREMARIN) vaginal cream Apply small amounts twice weekly  42.5 g  12  . fish oil-omega-3 fatty acids 1000  MG capsule Take 1 g by mouth daily.      Marland Kitchen glucose blood (ACCU-CHEK AVIVA) test strip 1 each by Other route 2 (two) times daily. Dx 250.00  100 each  12  . metFORMIN (GLUCOPHAGE) 500 MG tablet Take one tablet by mouth twice daily  180 tablet  2  . Multiple Vitamins-Minerals (CENTRUM SILVER) tablet Take 1 tablet by mouth daily.        . non-metallic deodorant Thornton Papas) MISC Apply 1 application topically daily as needed.      Marland Kitchen OVER THE COUNTER MEDICATION Take by mouth 2 (two) times daily. Presavision- for macular degeneration  prevention      . simvastatin (ZOCOR) 40 MG tablet Take 1 tablet (40 mg total) by mouth every evening.  100 tablet  3   No current facility-administered medications for this visit.    SURGICAL HISTORY:  Past Surgical History  Procedure Laterality Date  . Appendectomy    . Cholecystectomy    . Total abdominal hysterectomy w/ bilateral salpingoophorectomy    . Breast surgery  1990's    right- fibroid cyst  . Septoplasty    . Breast lumpectomy with needle localization and axillary sentinel lymph node bx Left 09/12/2012    Procedure: LEFT NEEDLE LOCALIZATION BREAST LUMPECTOMY TION AND AXILLARY SENTINEL LYMPH NODE BX;  Surgeon: Mariella Saa, MD;  Location: [facility];  Service: General;  Laterality: Left;  . Abdominal hysterectomy Bilateral 1999    w/b/l salpingo-oopherectomy    REVIEW OF SYSTEMS:  Pertinent items are noted in HPI.   HEALTH MAINTENANCE:  PHYSICAL EXAMINATION: Blood pressure 144/83, pulse 75, temperature 98.7 F (37.1 C), temperature source Oral, resp. rate 17, height 5\' 1"  (1.549 m), weight 136 lb 11.2 oz (62.007 kg). Body mass index is 25.84 kg/(m^2). ECOG PERFORMANCE STATUS: 0 - Asymptomatic   General appearance: alert, cooperative and appears stated age Neck: no adenopathy, no carotid bruit, no JVD, supple, symmetrical, trachea midline and thyroid not enlarged, symmetric, no tenderness/mass/nodules Lymph nodes: Cervical, supraclavicular, and axillary nodes normal. Resp: clear to auscultation bilaterally Back: symmetric, no curvature. ROM normal. No CVA tenderness. Cardio: regular rate and rhythm GI: soft, non-tender; bowel sounds normal; no masses,  no organomegaly Extremities: extremities normal, atraumatic, no cyanosis or edema Neurologic: Alert and oriented X 3, normal strength and tone. Normal symmetric reflexes. Normal coordination and gait Breast examination no masses nipple discharge left breast with well-healed surgical scar  LABORATORY DATA: Lab  Results  Component Value Date   WBC 6.1 04/14/2013   HGB 13.8 04/14/2013   HCT 42.3 04/14/2013   MCV 96.7 04/14/2013   PLT 192 04/14/2013      Chemistry      Component Value Date/Time   NA 141 04/14/2013 0957   NA 141 09/20/2012 0852   K 4.3 04/14/2013 0957   K 4.4 09/20/2012 0852   CL 104 09/20/2012 1302   CL 104 09/20/2012 0852   CO2 24 04/14/2013 0957   CO2 28 09/20/2012 0852   BUN 27.4* 04/14/2013 0957   BUN 30* 09/20/2012 0852   CREATININE 1.0 04/14/2013 0957   CREATININE 1.2 09/20/2012 0852      Component Value Date/Time   CALCIUM 10.0 04/14/2013 0957   CALCIUM 9.7 09/20/2012 0852   ALKPHOS 70 04/14/2013 0957   ALKPHOS 74 09/08/2012 0935   AST 19 04/14/2013 0957   AST 25 09/08/2012 0935   ALT 18 04/14/2013 0957   ALT 20 09/08/2012 0935   BILITOT 0.39 04/14/2013 0957  BILITOT 0.5 09/08/2012 0935       RADIOGRAPHIC STUDIES:  No results found.  ASSESSMENT: 29-year-old female with  #1 new diagnosis of invasive ductal carcinoma with calcifications grade 1 measuring 0.8 cm with low grade DCIS. One sentinel node was negative for metastatic disease. Tumor was ER positive HER-2/neu negative with a low Ki-67. Patient underwent a lumpectomy. Postoperatively she did well and went on to have radiation therapy which he completed 12/19/2012. She tolerated this well.  #2 patient has been receiving Arimidex 1 mg daily for grade 1 invasive ductal carcinoma. She's tolerating it well. She started this 3 months ago. Total of 5 years of therapy is planned.  PLAN:    #1Patient will continue Arimidex 1 mg daily.  #2 she will return in 6 months time for followup to evaluate side effects. However she knows to call me sooner if problems occur   All questions were answered. The patient knows to call the clinic with any problems, questions or concerns. We can certainly see the patient much sooner if necessary.  I spent 25 minutes counseling the patient face to face. The total time spent in the  appointment was 30 minutes.    Drue Second, MD Medical/Oncology [facility] [phone] (beeper) [phone] (Office)

## 2013-05-02 ENCOUNTER — Other Ambulatory Visit: Payer: Self-pay | Admitting: Family Medicine

## 2013-05-09 ENCOUNTER — Telehealth: Payer: Self-pay | Admitting: Family Medicine

## 2013-05-09 NOTE — Telephone Encounter (Signed)
Patient Information:  Caller Name: Adrienne [name]  Phone: [phone]  Patient: Adrienne [name], Adrienne [name]  Gender: Female  DOB: June 27, 1934  Age: 79 Years  PCP: Stevie Kern [facility])  Office Follow Up:  Does the office need to follow up with this patient?: Yes  Instructions For The Office: Please see sxs below. Pt is requesting abx for UTI.  Please follow up with pt.   Symptoms  Reason For Call & Symptoms: 05/08/13 urinary frequency.  05/09/13 back pain but not severe, urinary frequency, uncomfortable/burning when urinating,  Afebrile.   Drinking unsweet cranberry juice.  Advised pt of need for appt, but pt is requestng if Dr Sherren Mocha can call in Ciprofloxin due to it worked the past time and pt has a hx of hospitalization with UTI's.   (pt uses [facility] [place]  Reviewed Health History In EMR: Yes  Reviewed Medications In EMR: Yes  Reviewed Allergies In EMR: Yes  Reviewed Surgeries / Procedures: Yes  Date of Onset of Symptoms: 05/08/2013  Guideline(s) Used:  Urination Pain - Female  Disposition Per Guideline:   Go to Office Now  Reason For Disposition Reached:   Side (flank) or lower back pain present  Advice Given:  N/A  Patient Refused Recommendation:  Patient Requests Prescription  Pt is requesting abx for UTI.

## 2013-05-09 NOTE — Telephone Encounter (Signed)
Spoke with patient and an appointment made 

## 2013-05-10 ENCOUNTER — Ambulatory Visit (INDEPENDENT_AMBULATORY_CARE_PROVIDER_SITE_OTHER): Payer: Medicare Other | Admitting: Family Medicine

## 2013-05-10 ENCOUNTER — Encounter: Payer: Self-pay | Admitting: Family Medicine

## 2013-05-10 DIAGNOSIS — N390 Urinary tract infection, site not specified: Secondary | ICD-10-CM

## 2013-05-10 LAB — POCT URINALYSIS DIPSTICK
Bilirubin, UA: NEGATIVE
Blood, UA: NEGATIVE
Glucose, UA: NEGATIVE
KETONES UA: NEGATIVE
Nitrite, UA: NEGATIVE
PH UA: 6.5
PROTEIN UA: NEGATIVE
SPEC GRAV UA: 1.015
Urobilinogen, UA: 0.2

## 2013-05-10 MED ORDER — CIPROFLOXACIN HCL 500 MG PO TABS: 500.0000 mg | ORAL_TABLET | Freq: Two times a day (BID) | ORAL | Status: DC

## 2013-05-10 NOTE — Progress Notes (Signed)
   Subjective:    Patient ID: MCCAYLA SHIMADA, female    DOB: 1935/01/06, 79 y.o.   MRN: [id number]  HPI Elvy is a 79 year old female who comes in today with a two-day history of dysuria and frequency. No fever chills or back pain. 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 start Cipro.

## 2013-05-10 NOTE — Patient Instructions (Signed)
Cipro 500 mg,,,,,,,,, one twice daily for 1 week then 1 at bedtime until the bottle is empty  Drink lots of water  Return when necessary

## 2013-05-12 LAB — URINE CULTURE
Colony Count: NO GROWTH
Organism ID, Bacteria: NO GROWTH

## 2013-05-15 ENCOUNTER — Ambulatory Visit (HOSPITAL_BASED_OUTPATIENT_CLINIC_OR_DEPARTMENT_OTHER): Payer: Medicare Other | Admitting: Oncology

## 2013-05-15 ENCOUNTER — Encounter: Payer: Self-pay | Admitting: Oncology

## 2013-05-15 ENCOUNTER — Telehealth: Payer: Self-pay | Admitting: Oncology

## 2013-05-15 VITALS — BP 155/87 | HR 78 | Temp 97.6°F | Resp 18 | Ht 61.0 in | Wt 140.4 lb

## 2013-05-15 DIAGNOSIS — Z170 Estrogen receptor positive status [ER+]: Secondary | ICD-10-CM

## 2013-05-15 DIAGNOSIS — C50912 Malignant neoplasm of unspecified site of left female breast: Secondary | ICD-10-CM

## 2013-05-15 DIAGNOSIS — C50119 Malignant neoplasm of central portion of unspecified female breast: Secondary | ICD-10-CM

## 2013-05-15 MED ORDER — EXEMESTANE 25 MG PO TABS: 25.0000 mg | ORAL_TABLET | Freq: Every day | ORAL | Status: DC

## 2013-05-15 NOTE — Progress Notes (Signed)
OFFICE PROGRESS NOTE  CC  AdrienneCarroll [name], MD [address] [place] [address] Dr. Excell Seltzer  Dr. Arloa Koh  DIAGNOSIS: 79 year old female with new diagnosis of T1 N0 invasive ductal carcinoma of the left breast status post lumpectomy and sentinel lymph node biopsy.  STAGE:  Left breast  Invasive ductal carcinoma  ER positive100% PR positive 88%HER-2/neu negative  Ki-67 16%  T1 N0( stage I)  Status post lumpectomy with sentinel lymph node biopsy on 09/12/2012  PRIOR THERAPY: #1routine screening mammogram performed that showed an abnormality in the left breastand this was confirmed by ultrasound.her mammograms were done at [facility].she went on to have a core needle biopsy performed that showed invasive ductal carcinoma with associated DCIS and calcifications. Tumor was ER positive PR positive HER-2/neu negative with Ki-67 of 18%. Patient had MRI of the breasts performed on 08/29/2012 the MRI revealed  Within the upper central portion of the left breast enhancing mass measuring 1.2 x 1.2 x 1.2 cm. No others suspicious abnormalities were found.she went on to have a lumpectomy with sentinel lymph node biopsy on 09/12/2012. The final pathology revealed a 0.8 cm invasive ductal carcinoma grade 1 measuring 0.8 cm ER +100% PR +88% HER-2/neu negative Ki-67 16%. One sentinel node was negative for metastatic disease. She did have a focal close margin of DCIS at less than 1 mm  #2 s/p underwent radiation therapy starting 11/09/2012 through 12/19/2012 overall she tolerated it well  #3 patient began remedy axilla 1 mg daily in August 2014. Unfortunately she was not able to tolerate and this is now discontinued.  #5 to begin Aromasin 25 mg daily risks benefits and side effects of Aromasin are discussed with the patient. This is curative intent. Duration of 5 years of therapy is recommended.  CURRENT THERAPY: aromasin 25 mg daily beginning 05/15/2048  INTERVAL  HISTORY: Adrienne [name] 79 y.o. female returns for followup visit today.overall she is doing well we did discontinue the remote ask do to side effects. She will now begin Aromasin 25 mg daily. Treatment side effects were discussed with her and thoroughly. She denies any headaches double vision blurring of vision fevers chills or night sweats. No shortness of breath no chest pains or palpitations. Remainder of the template review of systems is negative.  MEDICAL HISTORY: Past Medical History  Diagnosis Date  . Hyperlipidemia   . Hypertension     Does not see a cardiologist  . History of frequent urinary tract infections   . Breast cancer 08/25/12    Invasive ductal ca,DCIS  . Allergy   . Diabetes mellitus     type II  . Hearing loss   . Gout   . Osteopenia   . Hx of radiation therapy 11/22/12- 12/19/12    breast 4250 cGy 17 sessions, left breast boost 750 cGy 3 sessions    ALLERGIES:  is allergic to penicillins and radiaplexrx.  MEDICATIONS:  Current Outpatient Prescriptions  Medication Sig Dispense Refill  . ACCU-CHEK FASTCLIX LANCETS MISC 1 each by Does not apply route daily.  102 each  3  . allopurinol (ZYLOPRIM) 300 MG tablet TAKE ONE TABLET BY MOUTH ONE TIME DAILY   90 tablet  3  . aspirin 81 MG tablet Take 81 mg by mouth daily.      Marland Kitchen atenolol (TENORMIN) 12.5 mg TABS tablet 1 by mouth every morning  100 tablet  3  . calcium-vitamin D 250-100 MG-UNIT per tablet Take 1 tablet by mouth 2 (two) times daily.      Marland Kitchen  Chlorpheniramine Maleate (CHLOR-TABLETS PO) Take by mouth.      . ciprofloxacin (CIPRO) 500 MG tablet Take 1 tablet (500 mg total) by mouth 2 (two) times daily.  20 tablet  1  . conjugated estrogens (PREMARIN) vaginal cream Apply small amounts twice weekly  42.5 g  12  . fish oil-omega-3 fatty acids 1000 MG capsule Take 1 g by mouth daily.      Marland Kitchen glucose blood (ACCU-CHEK AVIVA) test strip 1 each by Other route 2 (two) times daily. Dx 250.00  100 each  12  . metFORMIN  (GLUCOPHAGE) 500 MG tablet Take one tablet by mouth twice daily  180 tablet  2  . Multiple Vitamins-Minerals (CENTRUM SILVER) tablet Take 1 tablet by mouth daily.        . non-metallic deodorant Jethro Poling) MISC Apply 1 application topically daily as needed.      Marland Kitchen OVER THE COUNTER MEDICATION Take by mouth 2 (two) times daily. Presavision- for macular degeneration prevention      . simvastatin (ZOCOR) 40 MG tablet Take 1 tablet (40 mg total) by mouth every evening.  100 tablet  3  . exemestane (AROMASIN) 25 MG tablet Take 1 tablet (25 mg total) by mouth daily after breakfast.  30 tablet  6   No current facility-administered medications for this visit.    SURGICAL HISTORY:  Past Surgical History  Procedure Laterality Date  . Appendectomy    . Cholecystectomy    . Total abdominal hysterectomy w/ bilateral salpingoophorectomy    . Breast surgery  1990's    right- fibroid cyst  . Septoplasty    . Breast lumpectomy with needle localization and axillary sentinel lymph node bx Left 09/12/2012    Procedure: LEFT NEEDLE LOCALIZATION BREAST LUMPECTOMY TION AND AXILLARY SENTINEL LYMPH NODE BX;  Surgeon: Edward Jolly, MD;  Location: [place];  Service: General;  Laterality: Left;  . Abdominal hysterectomy Bilateral 1999    w/b/l salpingo-oopherectomy    REVIEW OF SYSTEMS:  Pertinent items are noted in HPI.   HEALTH MAINTENANCE:  PHYSICAL EXAMINATION: Blood pressure 155/87, pulse 78, temperature 97.6 F (36.4 C), temperature source Oral, resp. rate 18, height _0  (1.549 m), weight 140 lb 6.4 oz (63.685 kg). Body mass index is 26.54 kg/(m^2). ECOG PERFORMANCE STATUS: 0 - Asymptomatic   General appearance: alert, cooperative and appears stated age Neck: no adenopathy, no carotid bruit, no JVD, supple, symmetrical, trachea midline and thyroid not enlarged, symmetric, no tenderness/mass/nodules Lymph nodes: Cervical, supraclavicular, and axillary nodes normal. Resp: clear to auscultation  bilaterally Back: symmetric, no curvature. ROM normal. No CVA tenderness. Cardio: regular rate and rhythm GI: soft, non-tender; bowel sounds normal; no masses,  no organomegaly Extremities: extremities normal, atraumatic, no cyanosis or edema Neurologic: Alert and oriented X 3, normal strength and tone. Normal symmetric reflexes. Normal coordination and gait Breast examination no masses nipple discharge left breast with well-healed surgical scar  LABORATORY DATA: Lab Results  Component Value Date   WBC 6.1 04/14/2013   HGB 13.8 04/14/2013   HCT 42.3 04/14/2013   MCV 96.7 04/14/2013   PLT 192 04/14/2013      Chemistry      Component Value Date/Time   NA 141 04/14/2013 0957   NA 141 09/20/2012 0852   K 4.3 04/14/2013 0957   K 4.4 09/20/2012 0852   CL 104 09/20/2012 1302   CL 104 09/20/2012 0852   CO2 24 04/14/2013 0957   CO2 28 09/20/2012 0852   BUN 27.4*  04/14/2013 0957   BUN 30* 09/20/2012 0852   CREATININE 1.0 04/14/2013 0957   CREATININE 1.2 09/20/2012 0852      Component Value Date/Time   CALCIUM 10.0 04/14/2013 0957   CALCIUM 9.7 09/20/2012 0852   ALKPHOS 70 04/14/2013 0957   ALKPHOS 74 09/08/2012 0935   AST 19 04/14/2013 0957   AST 25 09/08/2012 0935   ALT 18 04/14/2013 0957   ALT 20 09/08/2012 0935   BILITOT 0.39 04/14/2013 0957   BILITOT 0.5 09/08/2012 0935       RADIOGRAPHIC STUDIES:  No results found.  ASSESSMENT: 79 year old female with  #1  diagnosis of invasive ductal carcinoma with calcifications grade 1 measuring 0.8 cm with low grade DCIS. One sentinel node was negative for metastatic disease. Tumor was ER positive HER-2/neu negative with a low Ki-67. Patient underwent a lumpectomy. Postoperatively she did well and went on to have radiation therapy which he completed 12/19/2012. She tolerated this well.  #2 status Post Arimidex 1 mg daily for grade 1 invasive ductal carcinoma. She's tolerating it well x1 year. Then treatment discontinued due to side  effects  #3 begin Aromasin 25 mg daily side effects were discussed with the patient. She is consented to therapy.  PLAN:    #1 Begin aromasin 25 mg daily  #2 she will return in 6 months time for followup to evaluate side effects. However she knows to call me sooner if problems occur   All questions were answered. The patient knows to call the clinic with any problems, questions or concerns. We can certainly see the patient much sooner if necessary.  I spent 25 minutes counseling the patient face to face. The total time spent in the appointment was 30 minutes.    Marcy Panning, MD Medical/Oncology [facility] [phone] (beeper) [phone] (Office)

## 2013-05-15 NOTE — Patient Instructions (Signed)
Exemestane tablets  What is this medicine?  EXEMESTANE (ex e MES tane) blocks the production of the hormone estrogen. Some types of breast cancer depend on estrogen to grow, and this medicine can stop tumor growth by blocking estrogen production. This medicine is for the treatment of breast cancer in postmenopausal women only.  This medicine may be used for other purposes; ask your health care provider or pharmacist if you have questions.  COMMON BRAND NAME(S): Aromasin  What should I tell my health care provider before I take this medicine?  They need to know if you have any of these conditions:  -an unusual or allergic reaction to exemestane, other medicines, foods, dyes, or preservatives  -pregnant or trying to get pregnant  -breast-feeding  How should I use this medicine?  Take this medicine by mouth with a glass of water. Follow the directions on the prescription label. Take your doses at regular intervals after a meal. Do not take your medicine more often than directed. Do not stop taking except on the advice of your doctor or health care professional.  Contact your pediatrician regarding the use of this medicine in children. Special care may be needed.  Overdosage: If you think you have taken too much of this medicine contact a poison control center or emergency room at once.  NOTE: This medicine is only for you. Do not share this medicine with others.  What if I miss a dose?  If you miss a dose, take the next dose as usual. Do not try to make up the missed dose. Do not take double or extra doses.  What may interact with this medicine?  Do not take this medicine with any of the following medications:  -female hormones, like estrogens and birth control pills  This medicine may also interact with the following medications:  -androstenedione  -phenytoin  -rifabutin, rifampin, or rifapentine  -St. John's Wort  This list may not describe all possible interactions. Give your health care provider a list of all the  medicines, herbs, non-prescription drugs, or dietary supplements you use. Also tell them if you smoke, drink alcohol, or use illegal drugs. Some items may interact with your medicine.  What should I watch for while using this medicine?  Visit your doctor or health care professional for regular checks on your progress.  If you experience hot flashes or sweating while taking this medicine, avoid alcohol, smoking and drinks with caffeine. This may help to decrease these side effects.  What side effects may I notice from receiving this medicine?  Side effects that you should report to your doctor or health care professional as soon as possible:  -any new or unusual symptoms  -changes in vision  -fever  -leg or arm swelling  -pain in bones, joints, or muscles  -pain in hips, back, ribs, arms, shoulders, or legs  Side effects that usually do not require medical attention (report to your doctor or health care professional if they continue or are bothersome):  -difficulty sleeping  -headache  -hot flashes  -sweating  -unusually weak or tired  This list may not describe all possible side effects. Call your doctor for medical advice about side effects. You may report side effects to FDA at 1-800-FDA-1088.  Where should I keep my medicine?  Keep out of the reach of children.  Store at room temperature between 15 and 30 degrees C (59 and 86 degrees F). Throw away any unused medicine after the expiration date.  NOTE: This sheet 

## 2013-05-15 NOTE — Telephone Encounter (Signed)
, °

## 2013-07-19 ENCOUNTER — Telehealth: Payer: Self-pay | Admitting: *Deleted

## 2013-07-19 NOTE — Telephone Encounter (Signed)
Received voice message from patient regarding Aromasin. Patient stated, "I started Aromasin in 05/15/13, and I've noticed a rash along my upper arms and across my chest. I've had the rash for 30 days. Please call me back at [phone]. Thank you." This RN has called the home number twice today and left messages. No return call yet.

## 2013-07-20 ENCOUNTER — Telehealth: Payer: Self-pay | Admitting: Adult Health

## 2013-07-20 ENCOUNTER — Telehealth: Payer: Self-pay

## 2013-07-20 NOTE — Telephone Encounter (Signed)
Call Documentation     Deatra Robinson, MD at 07/20/2013 10:20 AM     Status: Signed        Please have patient stop aromasin and have her come in to the NP within the next 3-4 days for evaluation        Prentiss Bells, RN at 07/20/2013 9:28 AM     Status: Signed        Pt reports rash and itching upper arm across right breast and starting onto upper left breat for approx last month. She believes it may be from the aromasin. Routed to [facility]

## 2013-07-20 NOTE — Telephone Encounter (Signed)
1240 - LMOVM - pt to return call Per KK, advised pt to stop taking aromasin and that she will be contacted by scheduling to be seen within next 3-4 days.  Pt voiced understanding.  POF sent

## 2013-07-20 NOTE — Telephone Encounter (Signed)
, °

## 2013-07-20 NOTE — Telephone Encounter (Signed)
Pt reports rash and itching upper arm across right breast and starting onto upper left breat for approx last month.  She believes it may be from the aromasin.  Routed to [facility]

## 2013-07-20 NOTE — Telephone Encounter (Signed)
Please have patient stop aromasin and have her come in to the NP within the next 3-4 days for evaluation

## 2013-07-24 ENCOUNTER — Encounter: Payer: Self-pay | Admitting: Adult Health

## 2013-07-24 ENCOUNTER — Ambulatory Visit (HOSPITAL_BASED_OUTPATIENT_CLINIC_OR_DEPARTMENT_OTHER): Payer: Medicare Other | Admitting: Adult Health

## 2013-07-24 VITALS — BP 131/79 | HR 76 | Temp 98.1°F | Resp 18 | Ht 61.0 in | Wt 139.9 lb

## 2013-07-24 DIAGNOSIS — C50912 Malignant neoplasm of unspecified site of left female breast: Secondary | ICD-10-CM

## 2013-07-24 DIAGNOSIS — R21 Rash and other nonspecific skin eruption: Secondary | ICD-10-CM

## 2013-07-24 DIAGNOSIS — B029 Zoster without complications: Secondary | ICD-10-CM

## 2013-07-24 DIAGNOSIS — C50119 Malignant neoplasm of central portion of unspecified female breast: Secondary | ICD-10-CM

## 2013-07-24 DIAGNOSIS — Z171 Estrogen receptor negative status [ER-]: Secondary | ICD-10-CM

## 2013-07-24 MED ORDER — VALACYCLOVIR HCL 1 G PO TABS: 1000.0000 mg | ORAL_TABLET | Freq: Three times a day (TID) | ORAL | Status: DC

## 2013-07-24 NOTE — Patient Instructions (Addendum)
Take Benadryl if you need it for itching.  Take the Valtrex as directed.  You can try restarting the Aromasin in 2 weeks.  Please call us if you have any questions or concerns.       Valacyclovir caplets What is this medicine? VALACYCLOVIR (val ay SYE kloe veer) is an antiviral medicine. It is used to treat or prevent infections caused by certain kinds of viruses. Examples of these infections include herpes and shingles. This medicine will not cure herpes. This medicine may be used for other purposes; ask your health care provider or pharmacist if you have questions. COMMON BRAND NAME(S): Valtrex What should I tell my health care provider before I take this medicine? They need to know if you have any of these conditions: -acquired immunodeficiency syndrome (AIDS) -any other condition that may weaken the immune system -bone marrow or kidney transplant -kidney disease -an unusual or allergic reaction to valacyclovir, acyclovir, ganciclovir, valganciclovir, other medicines, foods, dyes, or preservatives -pregnant or trying to get pregnant -breast-feeding How should I use this medicine? Take this medicine by mouth with a glass of water. Follow the directions on the prescription label. You can take this medicine with or without food. Take your doses at regular intervals. Do not take your medicine more often than directed. Finish the full course prescribed by your doctor or health care professional even if you think your condition is better. Do not stop taking except on the advice of your doctor or health care professional. Talk to your pediatrician regarding the use of this medicine in children. While this drug may be prescribed for children as young as 2 years for selected conditions, precautions do apply. Overdosage: If you think you have taken too much of this medicine contact a poison control center or emergency room at once. NOTE: This medicine is only for you. Do not share this medicine with  others. What if I miss a dose? If you miss a dose, take it as soon as you can. If it is almost time for your next dose, take only that dose. Do not take double or extra doses. What may interact with this medicine? -cimetidine -probenecid This list may not describe all possible interactions. Give your health care provider a list of all the medicines, herbs, non-prescription drugs, or dietary supplements you use. Also tell them if you smoke, drink alcohol, or use illegal drugs. Some items may interact with your medicine. What should I watch for while using this medicine? Tell your doctor or health care professional if your symptoms do not start to get better after 1 week. This medicine works best when taken early in the course of an infection, within the first 14 hours. Begin treatment as soon as possible after the first signs of infection like tingling, itching, or pain in the affected area. It is possible that genital herpes may still be spread even when you are not having symptoms. Always use safer sex practices like condoms made of latex or polyurethane whenever you have sexual contact. You should stay well hydrated while taking this medicine. Drink plenty of fluids. What side effects may I notice from receiving this medicine? Side effects that you should report to your doctor or health care professional as soon as possible: -allergic reactions like skin rash, itching or hives, swelling of the face, lips, or tongue -aggressive behavior -confusion -hallucinations -problems with balance, talking, walking -stomach pain -tremor -trouble passing urine or change in the amount of urine Side effects that usually do  not require medical attention (report to your doctor or health care professional if they continue or are bothersome): -dizziness -headache -nausea, vomiting This list may not describe all possible side effects. Call your doctor for medical advice about side effects. You may report side  effects to FDA at 1-800-FDA-1088. Where should I keep my medicine? Keep out of the reach of children. Store at room temperature between 15 and 25 degrees C (59 and 77 degrees F). Keep container tightly closed. Throw away any unused medicine after the expiration date. NOTE: This sheet is a summary. It may not cover all possible information. If you have questions about this medicine, talk to your doctor, pharmacist, or health care provider.  2014, [employer]vier/[employer] Standard. (2012-04-05 16:34:05) Shingles Shingles (herpes zoster) is an infection that is caused by the same virus that causes chickenpox (varicella). The infection causes a painful skin rash and fluid-filled blisters, which eventually break open, crust over, and heal. It may occur in any area of the body, but it usually affects only one side of the body or face. The pain of shingles usually lasts about 1 month. However, some people with shingles may develop long-term (chronic) pain in the affected area of the body. Shingles often occurs many years after the person had chickenpox. It is more common:  In people older than 50 years.  In people with weakened immune systems, such as those with HIV, AIDS, or cancer.  In people taking medicines that weaken the immune system, such as transplant medicines.  In people under great stress. CAUSES  Shingles is caused by the varicella zoster virus (VZV), which also causes chickenpox. After a person is infected with the virus, it can remain in the person's body for years in an inactive state (dormant). To cause shingles, the virus reactivates and breaks out as an infection in a nerve root. The virus can be spread from person to person (contagious) through contact with open blisters of the shingles rash. It will only spread to people who have not had chickenpox. When these people are exposed to the virus, they may develop chickenpox. They will not develop shingles. Once the blisters scab over, the person  is no longer contagious and cannot spread the virus to others. SYMPTOMS  Shingles shows up in stages. The initial symptoms may be pain, itching, and tingling in an area of the skin. This pain is usually described as burning, stabbing, or throbbing.In a few days or weeks, a painful red rash will appear in the area where the pain, itching, and tingling were felt. The rash is usually on one side of the body in a band or belt-like pattern. Then, the rash usually turns into fluid-filled blisters. They will scab over and dry up in approximately 2 3 weeks. Flu-like symptoms may also occur with the initial symptoms, the rash, or the blisters. These may include:  Fever.  Chills.  Headache.  Upset stomach. DIAGNOSIS  Your caregiver will perform a skin exam to diagnose shingles. Skin scrapings or fluid samples may also be taken from the blisters. This sample will be examined under a microscope or sent to a lab for further testing. TREATMENT  There is no specific cure for shingles. Your caregiver will likely prescribe medicines to help you manage the pain, recover faster, and avoid long-term problems. This may include antiviral drugs, anti-inflammatory drugs, and pain medicines. HOME CARE INSTRUCTIONS   Take a cool bath or apply cool compresses to the area of the rash or blisters as directed.  This may help with the pain and itching.   Only take over-the-counter or prescription medicines as directed by your caregiver.   Rest as directed by your caregiver.  Keep your rash and blisters clean with mild soap and cool water or as directed by your caregiver.  Do not pick your blisters or scratch your rash. Apply an anti-itch cream or numbing creams to the affected area as directed by your caregiver.  Keep your shingles rash covered with a loose bandage (dressing).  Avoid skin contact with:  Babies.   Pregnant women.   Children with eczema.   Elderly people with transplants.   People with  chronic illnesses, such as leukemia or AIDS.   Wear loose-fitting clothing to help ease the pain of material rubbing against the rash.  Keep all follow-up appointments with your caregiver.If the area involved is on your face, you may receive a referral for follow-up to a specialist, such as an eye doctor (ophthalmologist) or an ear, nose, and throat (ENT) doctor. Keeping all follow-up appointments will help you avoid eye complications, chronic pain, or disability.  SEEK IMMEDIATE MEDICAL CARE IF:   You have facial pain, pain around the eye area, or loss of feeling on one side of your face.  You have ear pain or ringing in your ear.  You have loss of taste.  Your pain is not relieved with prescribed medicines.   Your redness or swelling spreads.   You have more pain and swelling.  Your condition is worsening or has changed.   You have a feveror persistent symptoms for more than 2 3 days.  You have a fever and your symptoms suddenly get worse. MAKE SURE YOU:  Understand these instructions.  Will watch your condition.  Will get help right away if you are not doing well or get worse. Document Released: 04/20/2005 Document Revised: 01/13/2012 Document Reviewed: 12/03/2011 [facility] Patient Information 2014 [place].

## 2013-07-24 NOTE — Progress Notes (Signed)
Hematology and Oncology Follow Up Visit  Adrienne [name] [id number] February 07, 1935 79 y.o. 07/24/2013 9:59 AM  Principle Diagnosis: 79 year old female with stage I ER/PR positive, HER-2/neu negative invasive ductal carcinoma of the left breast.     Prior Therapy: #1routine screening mammogram performed that showed an abnormality in the left breastand this was confirmed by ultrasound.her mammograms were done at [facility].she went on to have a core needle biopsy performed that showed invasive ductal carcinoma with associated DCIS and calcifications. Tumor was ER positive PR positive HER-2/neu negative with Ki-67 of 18%. Patient had MRI of the breasts performed on 08/29/2012 the MRI revealed the following:  Within the upper central portion of the left breast enhancing mass measuring 1.2 x 1.2 x 1.2 cm. No others suspicious abnormalities were found.  Patient went on to have a lumpectomy with sentinel lymph node biopsy on 09/12/2012. The final pathology revealed a 0.8 cm invasive ductal carcinoma grade 1 measuring 0.8 cm ER +100% PR +88% HER-2/neu negative Ki-67 16%. One sentinel node was negative for metastatic disease. She did have a focal close margin of DCIS at less than 1 mm.  #2 s/p  radiation therapy from 11/09/2012 through 12/19/2012.  #3 patient began Arimidex 1 mg daily in August 2014. Unfortunately she was not able to tolerate due to diarrhea, and it was stopped in January, 2015.    #5 Patient began Aromasin 25 mg daily in January, 2015 with a 5 year duration recommended.   Current therapy: Aromasin 25 mg daily  Interim History:  [name] is a 79 year old female with left breast invasive ductal carcinoma who was started on Aromasin 25 mg daily in January, 2015.  She was switched from Arimidex due to diarrhea.  She developed a red rash and itching starting last week.  She is not taking any new medications.  She stopped taking the Aromasin beginning Thursday, July 20, 2013.  She is applying  lotion to the area.  The rash remains, and it is still occasionally itching.  She denies pain to the area, swelling, or fevers.  She can't tell if she's getting used to it, or if it really is improving.  Otherwise, a 10 point ROS is neg.   Medications:  Current Outpatient Prescriptions  Medication Sig Dispense Refill  . ACCU-CHEK FASTCLIX LANCETS MISC 1 each by Does not apply route daily.  102 each  3  . allopurinol (ZYLOPRIM) 300 MG tablet TAKE ONE TABLET BY MOUTH ONE TIME DAILY   90 tablet  3  . aspirin 81 MG tablet Take 81 mg by mouth daily.      Marland Kitchen atenolol (TENORMIN) 12.5 mg TABS tablet 1 by mouth every morning  100 tablet  3  . calcium-vitamin D 250-100 MG-UNIT per tablet Take 1 tablet by mouth 2 (two) times daily.      . Chlorpheniramine Maleate (CHLOR-TABLETS PO) Take by mouth.      . conjugated estrogens (PREMARIN) vaginal cream Apply small amounts twice weekly  42.5 g  12  . exemestane (AROMASIN) 25 MG tablet Take 1 tablet (25 mg total) by mouth daily after breakfast.  30 tablet  6  . fish oil-omega-3 fatty acids 1000 MG capsule Take 1 g by mouth daily.      Marland Kitchen glucose blood (ACCU-CHEK AVIVA) test strip 1 each by Other route 2 (two) times daily. Dx 250.00  100 each  12  . metFORMIN (GLUCOPHAGE) 500 MG tablet Take one tablet by mouth twice daily  180 tablet  2  . Multiple Vitamins-Minerals (CENTRUM SILVER) tablet Take 1 tablet by mouth daily.        Marland Kitchen OVER THE COUNTER MEDICATION Take by mouth 2 (two) times daily. Presavision- for macular degeneration prevention      . simvastatin (ZOCOR) 40 MG tablet Take 1 tablet (40 mg total) by mouth every evening.  100 tablet  3  . ciprofloxacin (CIPRO) 500 MG tablet Take 1 tablet (500 mg total) by mouth 2 (two) times daily.  20 tablet  1   No current facility-administered medications for this visit.     Allergies:  Allergies  Allergen Reactions  . Penicillins Hives and Swelling  . Radiaplexrx [Woun'Dres Hydrogel Wound Dress] Hives    Past  Medical History, Surgical history, Social history, and Family History were reviewed and updated.  Review of Systems: A 10 point review of systems was conducted and is otherwise negative except for what is noted above.     Physical Exam: Blood pressure 131/79, pulse 76, temperature 98.1 F (36.7 C), temperature source Oral, resp. rate 18, height $RemoveBe'5\' 1"'OgMOzwBfR$  (1.549 m), weight 139 lb 14.4 oz (63.458 kg). GENERAL: Patient is a well appearing female in no acute distress HEENT:  Sclerae anicteric.  Oropharynx clear and moist. No ulcerations or evidence of oropharyngeal candidiasis. Neck is supple.  NODES:  No cervical, supraclavicular, or axillary lymphadenopathy palpated.  BREAST EXAM:  Deferred. LUNGS:  Clear to auscultation bilaterally.  No wheezes or rhonchi. HEART:  Regular rate and rhythm. No murmur appreciated. ABDOMEN:  Soft, nontender.  Positive, normoactive bowel sounds. No organomegaly palpated. MSK:  No focal spinal tenderness to palpation. Full range of motion bilaterally in the upper extremities. EXTREMITIES:  No peripheral edema.   SKIN:  Patient with vesicular rash in a linear pattern along right chest wall, and extending down into inner arm.   NEURO:  Nonfocal. Well oriented.  Appropriate affect. ECOG: 1      Lab Results: Lab Results  Component Value Date   WBC 6.1 04/14/2013   HGB 13.8 04/14/2013   HCT 42.3 04/14/2013   MCV 96.7 04/14/2013   PLT 192 04/14/2013     Chemistry      Component Value Date/Time   NA 141 04/14/2013 0957   NA 141 09/20/2012 0852   K 4.3 04/14/2013 0957   K 4.4 09/20/2012 0852   CL 104 09/20/2012 1302   CL 104 09/20/2012 0852   CO2 24 04/14/2013 0957   CO2 28 09/20/2012 0852   BUN 27.4* 04/14/2013 0957   BUN 30* 09/20/2012 0852   CREATININE 1.0 04/14/2013 0957   CREATININE 1.2 09/20/2012 0852      Component Value Date/Time   CALCIUM 10.0 04/14/2013 0957   CALCIUM 9.7 09/20/2012 0852   ALKPHOS 70 04/14/2013 0957   ALKPHOS 74 09/08/2012 0935    AST 19 04/14/2013 0957   AST 25 09/08/2012 0935   ALT 18 04/14/2013 0957   ALT 20 09/08/2012 0935   BILITOT 0.39 04/14/2013 0957   BILITOT 0.5 09/08/2012 0935         Assessment and Plan:   Patient is a 79 year old female with   1.  Stage I ER/PR positive, HER-2/neu negative invasive ductal carcinoma of the left breast.  She has underwent lumpectomy, followed by radiation, and anti-estrogen therapy as above.  For now she will continue to hold the Aromasin and restart in two weeks.    2:  Shingles:  She has linear vesicular  intensely itching rash along her right chest wall only and down the inner portion of her right arm.  This is consistent with shingles.  I prescribed Valtrex TID, and I recommend she take Benadryl if needed for the itching.  When I discussed this with her she noted that she has had shingles before and it felt exactly like this.  She will restart the Aromasin in 2 weeks.  I gave her detailed information and education in her AVS regarding shingles, Valtrex, and to restart her Aromasin in 2 weeks.    3.  The patient will return in approximately one month for her routine f/u with Dr. Humphrey Rolls.    I spent 25 minutes counseling the patient face to face.  The total time spent in the appointment was 30 minutes.   Minette Headland, [place] [phone] 3/23/20159:59 AM

## 2013-08-21 ENCOUNTER — Other Ambulatory Visit: Payer: Self-pay | Admitting: *Deleted

## 2013-08-21 ENCOUNTER — Telehealth: Payer: Self-pay | Admitting: Oncology

## 2013-08-21 DIAGNOSIS — C50912 Malignant neoplasm of unspecified site of left female breast: Secondary | ICD-10-CM

## 2013-08-21 DIAGNOSIS — C50919 Malignant neoplasm of unspecified site of unspecified female breast: Secondary | ICD-10-CM

## 2013-08-21 NOTE — Telephone Encounter (Signed)
LVM ADVISING APPT 4/21 CANCELLED AND MOVED TO 4/29 @ 9.30AM WITH DR. Earnest Conroy DUE TO MD LOA. TIA

## 2013-08-22 ENCOUNTER — Other Ambulatory Visit: Payer: Medicare Other

## 2013-08-22 ENCOUNTER — Ambulatory Visit: Payer: Medicare Other | Admitting: Oncology

## 2013-08-30 ENCOUNTER — Ambulatory Visit (HOSPITAL_BASED_OUTPATIENT_CLINIC_OR_DEPARTMENT_OTHER): Payer: Medicare Other | Admitting: Hematology and Oncology

## 2013-08-30 ENCOUNTER — Telehealth: Payer: Self-pay | Admitting: Hematology and Oncology

## 2013-08-30 ENCOUNTER — Ambulatory Visit: Payer: Medicare Other

## 2013-08-30 ENCOUNTER — Other Ambulatory Visit (HOSPITAL_BASED_OUTPATIENT_CLINIC_OR_DEPARTMENT_OTHER): Payer: Medicare Other

## 2013-08-30 VITALS — BP 158/81 | HR 74 | Temp 97.7°F | Resp 20 | Ht 61.0 in | Wt 137.1 lb

## 2013-08-30 DIAGNOSIS — D721 Eosinophilia, unspecified: Secondary | ICD-10-CM

## 2013-08-30 DIAGNOSIS — M899 Disorder of bone, unspecified: Secondary | ICD-10-CM

## 2013-08-30 DIAGNOSIS — M949 Disorder of cartilage, unspecified: Secondary | ICD-10-CM

## 2013-08-30 DIAGNOSIS — C50912 Malignant neoplasm of unspecified site of left female breast: Secondary | ICD-10-CM

## 2013-08-30 DIAGNOSIS — R21 Rash and other nonspecific skin eruption: Secondary | ICD-10-CM

## 2013-08-30 DIAGNOSIS — C50919 Malignant neoplasm of unspecified site of unspecified female breast: Secondary | ICD-10-CM

## 2013-08-30 DIAGNOSIS — C50119 Malignant neoplasm of central portion of unspecified female breast: Secondary | ICD-10-CM

## 2013-08-30 DIAGNOSIS — Z79811 Long term (current) use of aromatase inhibitors: Secondary | ICD-10-CM

## 2013-08-30 DIAGNOSIS — Z170 Estrogen receptor positive status [ER+]: Secondary | ICD-10-CM

## 2013-08-30 DIAGNOSIS — D72824 Basophilia: Secondary | ICD-10-CM

## 2013-08-30 LAB — CBC WITH DIFFERENTIAL/PLATELET
BASO%: 1.2 % (ref 0.0–2.0)
Basophils Absolute: 0.1 10*3/uL (ref 0.0–0.1)
EOS%: 3.4 % (ref 0.0–7.0)
Eosinophils Absolute: 0.2 10*3/uL (ref 0.0–0.5)
HEMATOCRIT: 44.4 % (ref 34.8–46.6)
HGB: 14.5 g/dL (ref 11.6–15.9)
LYMPH#: 1.3 10*3/uL (ref 0.9–3.3)
LYMPH%: 19.1 % (ref 14.0–49.7)
MCH: 31.2 pg (ref 25.1–34.0)
MCHC: 32.5 g/dL (ref 31.5–36.0)
MCV: 96 fL (ref 79.5–101.0)
MONO#: 0.8 10*3/uL (ref 0.1–0.9)
MONO%: 11.1 % (ref 0.0–14.0)
NEUT#: 4.6 10*3/uL (ref 1.5–6.5)
NEUT%: 65.2 % (ref 38.4–76.8)
Platelets: 176 10*3/uL (ref 145–400)
RBC: 4.63 10*6/uL (ref 3.70–5.45)
RDW: 15.1 % — ABNORMAL HIGH (ref 11.2–14.5)
WBC: 7 10*3/uL (ref 3.9–10.3)

## 2013-08-30 LAB — COMPREHENSIVE METABOLIC PANEL (CC13)
ALT: 40 U/L (ref 0–55)
AST: 31 U/L (ref 5–34)
Albumin: 3.9 g/dL (ref 3.5–5.0)
Alkaline Phosphatase: 85 U/L (ref 40–150)
Anion Gap: 11 mEq/L (ref 3–11)
BUN: 22.2 mg/dL (ref 7.0–26.0)
CO2: 27 mEq/L (ref 22–29)
CREATININE: 1.1 mg/dL (ref 0.6–1.1)
Calcium: 10.1 mg/dL (ref 8.4–10.4)
Chloride: 104 mEq/L (ref 98–109)
Glucose: 130 mg/dl (ref 70–140)
Potassium: 3.9 mEq/L (ref 3.5–5.1)
Sodium: 142 mEq/L (ref 136–145)
Total Bilirubin: 0.4 mg/dL (ref 0.20–1.20)
Total Protein: 6.9 g/dL (ref 6.4–8.3)

## 2013-08-30 NOTE — Telephone Encounter (Signed)
, °

## 2013-09-01 LAB — TRYPTASE: Tryptase: 6 ug/L (ref <11)

## 2013-09-01 LAB — IGE: IgE (Immunoglobulin E), Serum: 1.8 IU/mL (ref 0.0–180.0)

## 2013-09-04 NOTE — Progress Notes (Signed)
Hematology and Oncology Follow Up Visit  Adrienne [name] [id number] Dec 18, 1934 79 y.o. 09/04/2013 12:49 AM  Principle Diagnosis: 79 year old female with stage I ER/PR positive, HER-2/neu negative invasive ductal carcinoma of the left breast.     Prior Therapy: #1routine screening mammogram performed that showed an abnormality in the left breastand this was confirmed by ultrasound.her mammograms were done at [facility].she went on to have a core needle biopsy performed that showed invasive ductal carcinoma with associated DCIS and calcifications. Tumor was ER positive PR positive HER-2/neu negative with Ki-67 of 18%. Patient had MRI of the breasts performed on 08/29/2012 the MRI revealed the following:  Within the upper central portion of the left breast enhancing mass measuring 1.2 x 1.2 x 1.2 cm. No others suspicious abnormalities were found.  Patient went on to have a lumpectomy with sentinel lymph node biopsy on 09/12/2012. The final pathology revealed a 0.8 cm invasive ductal carcinoma grade 1 measuring 0.8 cm ER +100% PR +88% HER-2/neu negative Ki-67 16%. One sentinel node was negative for metastatic disease. She did have a focal close margin of DCIS at less than 1 mm.  #2 s/p  radiation therapy from 11/09/2012 through 12/19/2012.  #3 patient began Arimidex 1 mg daily in August 2014. Unfortunately she was not able to tolerate due to diarrhea, and it was stopped in January, 2015.    #5 Patient began Aromasin 25 mg daily in January, 2015 with a 5 year duration recommended.   Current therapy: Aromasin 25 mg daily  Interim History:  Ms.  [name] is a 79 year old female with left breast invasive ductal carcinoma who was started on Aromasin 25 mg daily in January, 2015.   She developed a red rash and itching .  She stopped taking the Aromasin frm July 20, 2013. She continue to have rash and itching even after stopping Aromasin. She is now on claritin. She denies pain, swelling, or fevers.    Medications:  Current Outpatient Prescriptions  Medication Sig Dispense Refill  . ACCU-CHEK FASTCLIX LANCETS MISC 1 each by Does not apply route daily.  102 each  3  . allopurinol (ZYLOPRIM) 300 MG tablet TAKE ONE TABLET BY MOUTH ONE TIME DAILY   90 tablet  3  . aspirin 81 MG tablet Take 81 mg by mouth daily.      Marland Kitchen atenolol (TENORMIN) 12.5 mg TABS tablet 1 by mouth every morning  100 tablet  3  . calcium-vitamin D 250-100 MG-UNIT per tablet Take 1 tablet by mouth 2 (two) times daily.      . Chlorpheniramine Maleate (CHLOR-TABLETS PO) Take by mouth.      . fish oil-omega-3 fatty acids 1000 MG capsule Take 1 g by mouth daily.      Marland Kitchen glucose blood (ACCU-CHEK AVIVA) test strip 1 each by Other route 2 (two) times daily. Dx 250.00  100 each  12  . loratadine (CLARITIN) 10 MG tablet Take 10 mg by mouth daily.      . metFORMIN (GLUCOPHAGE) 500 MG tablet Take one tablet by mouth twice daily  180 tablet  2  . Multiple Vitamins-Minerals (CENTRUM SILVER) tablet Take 1 tablet by mouth daily.        Marland Kitchen OVER THE COUNTER MEDICATION Take by mouth 2 (two) times daily. Presavision- for macular degeneration prevention      . simvastatin (ZOCOR) 40 MG tablet Take 1 tablet (40 mg total) by mouth every evening.  100 tablet  3  . conjugated estrogens (  PREMARIN) vaginal cream 1 Applicatorful. Apply small amounts twice weekly-  Pt reports only using rarely maybe once a week or once every 2 wks.      Marland Kitchen exemestane (AROMASIN) 25 MG tablet Take 1 tablet (25 mg total) by mouth daily after breakfast.  30 tablet  6   No current facility-administered medications for this visit.     Allergies:  Allergies  Allergen Reactions  . Penicillins Hives and Swelling  . Radiaplexrx [Woun'Dres Hydrogel Wound Dress] Hives    Past Medical History, Surgical history, Social history, and Family History were reviewed and updated.  Review of Systems: A 10 point review of systems was conducted and is otherwise negative except for  what is noted above.     Physical Exam: Blood pressure 158/81, pulse 74, temperature 97.7 F (36.5 C), temperature source Oral, resp. rate 20, height _0  (1.549 m), weight 137 lb 1.6 oz (62.188 kg). GENERAL: Patient is a well appearing female in no acute distress HEENT:  Sclerae anicteric.  Oropharynx clear and moist. No ulcerations or evidence of oropharyngeal candidiasis. Neck is supple.  NODES:  No cervical, supraclavicular, or axillary lymphadenopathy palpated.  BREAST EXAM:  Deferred. LUNGS:  Clear to auscultation bilaterally.  No wheezes or rhonchi. HEART:  Regular rate and rhythm. No murmur appreciated. ABDOMEN:  Soft, nontender.  Positive, normoactive bowel sounds. No organomegaly palpated. MSK:  No focal spinal tenderness to palpation. Full range of motion bilaterally in the upper extremities. EXTREMITIES:  No peripheral edema.   SKIN:  Patient with erthymatous rash in a linear pattern along right chest wall, and extending down into inner arm.   NEURO:  Nonfocal. Well oriented.  Appropriate affect. ECOG: 1      Lab Results: Lab Results  Component Value Date   WBC 7.0 08/30/2013   HGB 14.5 08/30/2013   HCT 44.4 08/30/2013   MCV 96.0 08/30/2013   PLT 176 08/30/2013     Chemistry      Component Value Date/Time   NA 142 08/30/2013 0926   NA 141 09/20/2012 0852   K 3.9 08/30/2013 0926   K 4.4 09/20/2012 0852   CL 104 09/20/2012 1302   CL 104 09/20/2012 0852   CO2 27 08/30/2013 0926   CO2 28 09/20/2012 0852   BUN 22.2 08/30/2013 0926   BUN 30* 09/20/2012 0852   CREATININE 1.1 08/30/2013 0926   CREATININE 1.2 09/20/2012 0852      Component Value Date/Time   CALCIUM 10.1 08/30/2013 0926   CALCIUM 9.7 09/20/2012 0852   ALKPHOS 85 08/30/2013 0926   ALKPHOS 74 09/08/2012 0935   AST 31 08/30/2013 0926   AST 25 09/08/2012 0935   ALT 40 08/30/2013 0926   ALT 20 09/08/2012 0935   BILITOT 0.40 08/30/2013 0926   BILITOT 0.5 09/08/2012 0935         Assessment and Plan:   Patient is a 79  year old female with   1.  Stage I ER/PR positive, HER-2/neu negative invasive ductal carcinoma of the left breast.  She has underwent lumpectomy, followed by radiation, and anti-estrogen therapy as above.  For now she will continue to hold the Aromasin,  until seen by Dermatology  2.  Shingles: S/p completion of valacyclovir with minimal improvement  3.  Skin rash, eosinophilia and increase in Basophills secondary to shingles versus r/o allergic/ other etiologies: I have ordered serum tryptase, urine eosinopjhills, stool for ova and parastes,  IgE levels and referred to Dermatology for further  evaluation  4. Mammogram performed in April 2015 revealed no evidence of malignancy  5. Dexa scan done in September 2014 revealed low bone mass: Continue calcium and Vitamin-D  I spent 20 minutes counseling the patient face to face.  The total time spent in the appointment was 30 minutes.   Wilmon Arms, [place] [phone] 5/4/201512:49 AM

## 2013-09-21 ENCOUNTER — Other Ambulatory Visit (HOSPITAL_BASED_OUTPATIENT_CLINIC_OR_DEPARTMENT_OTHER): Payer: Medicare Other

## 2013-09-21 ENCOUNTER — Encounter: Payer: Self-pay | Admitting: Adult Health

## 2013-09-21 ENCOUNTER — Telehealth: Payer: Self-pay | Admitting: Adult Health

## 2013-09-21 ENCOUNTER — Ambulatory Visit (HOSPITAL_BASED_OUTPATIENT_CLINIC_OR_DEPARTMENT_OTHER): Payer: Medicare Other | Admitting: Adult Health

## 2013-09-21 VITALS — BP 154/63 | HR 80 | Temp 97.8°F | Resp 17 | Ht 61.0 in | Wt 137.5 lb

## 2013-09-21 DIAGNOSIS — Z170 Estrogen receptor positive status [ER+]: Secondary | ICD-10-CM

## 2013-09-21 DIAGNOSIS — C50919 Malignant neoplasm of unspecified site of unspecified female breast: Secondary | ICD-10-CM

## 2013-09-21 DIAGNOSIS — C50119 Malignant neoplasm of central portion of unspecified female breast: Secondary | ICD-10-CM

## 2013-09-21 LAB — COMPREHENSIVE METABOLIC PANEL (CC13)
ALK PHOS: 83 U/L (ref 40–150)
ALT: 20 U/L (ref 0–55)
AST: 18 U/L (ref 5–34)
Albumin: 3.9 g/dL (ref 3.5–5.0)
Anion Gap: 11 mEq/L (ref 3–11)
BILIRUBIN TOTAL: 0.61 mg/dL (ref 0.20–1.20)
BUN: 28.4 mg/dL — ABNORMAL HIGH (ref 7.0–26.0)
CO2: 25 mEq/L (ref 22–29)
Calcium: 10.6 mg/dL — ABNORMAL HIGH (ref 8.4–10.4)
Chloride: 105 mEq/L (ref 98–109)
Creatinine: 1.1 mg/dL (ref 0.6–1.1)
GLUCOSE: 104 mg/dL (ref 70–140)
Potassium: 3.8 mEq/L (ref 3.5–5.1)
Sodium: 141 mEq/L (ref 136–145)
Total Protein: 6.8 g/dL (ref 6.4–8.3)

## 2013-09-21 LAB — CBC WITH DIFFERENTIAL/PLATELET
BASO%: 0.9 % (ref 0.0–2.0)
Basophils Absolute: 0.1 10*3/uL (ref 0.0–0.1)
EOS%: 3.1 % (ref 0.0–7.0)
Eosinophils Absolute: 0.2 10*3/uL (ref 0.0–0.5)
HCT: 43.3 % (ref 34.8–46.6)
HGB: 14.2 g/dL (ref 11.6–15.9)
LYMPH%: 20.1 % (ref 14.0–49.7)
MCH: 31.5 pg (ref 25.1–34.0)
MCHC: 32.8 g/dL (ref 31.5–36.0)
MCV: 95.9 fL (ref 79.5–101.0)
MONO#: 0.7 10*3/uL (ref 0.1–0.9)
MONO%: 9.6 % (ref 0.0–14.0)
NEUT%: 66.3 % (ref 38.4–76.8)
NEUTROS ABS: 5 10*3/uL (ref 1.5–6.5)
PLATELETS: 182 10*3/uL (ref 145–400)
RBC: 4.51 10*6/uL (ref 3.70–5.45)
RDW: 14.7 % — ABNORMAL HIGH (ref 11.2–14.5)
WBC: 7.5 10*3/uL (ref 3.9–10.3)
lymph#: 1.5 10*3/uL (ref 0.9–3.3)

## 2013-09-21 NOTE — Progress Notes (Signed)
ID: [name] OB: 04-10-1935  MR#: [id number]  NMM#:[id number]  PCP: Joycelyn Man, MD GYN:   SU:  OTHER MD:  CHIEF COMPLAINT: "Can I start back my Aromasin?"  BREAST CANCER HISTORY:  1routine screening mammogram performed that showed an abnormality in the left breastand this was confirmed by ultrasound.her mammograms were done at [facility].she went on to have a core needle biopsy performed that showed invasive ductal carcinoma with associated DCIS and calcifications. Tumor was ER positive PR positive HER-2/neu negative with Ki-67 of 18%. Patient had MRI of the breasts performed on 08/30/1998 the MRI revealed the following: Within the upper central portion of the left breast enhancing mass measuring 1.2 x 1.2 x 1.2 cm. No others suspicious abnormalities were found.  CURRENT THERAPY: Aromasin daily  INTERVAL HISTORY:  Patient is here today for follow up.  She was seen in clinic last month by Dr. Earnest Conroy due to a rash.  Her Aromasin was held for the time being, and she was evaluated by dermatology.  She ended up having scabies.  She is being treated by her dermatologist and her scabies are nearly resolved.  There was a scabies outbreak at the nursing facility where her husband resides.  She spends 5-6 hours per day at this facility and she believes this is where she contracted it.  Otherwise, she denies fevers, chills, nausea, vomiting, constipation, diarrhea, numbness/ or any further concerns.    REVIEW OF SYSTEMS: A 10 point review of systems was conducted and is otherwise negative except for what is noted above.     PAST MEDICAL HISTORY: Past Medical History  Diagnosis Date  . Hyperlipidemia   . Hypertension     Does not see a cardiologist  . History of frequent urinary tract infections   . Breast cancer 08/25/12    Invasive ductal ca,DCIS  . Allergy   . Diabetes mellitus     type II  . Hearing loss   . Gout   . Osteopenia   . Hx of radiation therapy 11/22/12- 12/19/12     breast 4250 cGy 17 sessions, left breast boost 750 cGy 3 sessions    PAST SURGICAL HISTORY: Past Surgical History  Procedure Laterality Date  . Appendectomy    . Cholecystectomy    . Total abdominal hysterectomy w/ bilateral salpingoophorectomy    . Breast surgery  1990's    right- fibroid cyst  . Septoplasty    . Breast lumpectomy with needle localization and axillary sentinel lymph node bx Left 09/12/2012    Procedure: LEFT NEEDLE LOCALIZATION BREAST LUMPECTOMY TION AND AXILLARY SENTINEL LYMPH NODE BX;  Surgeon: Edward Jolly, MD;  Location: [place];  Service: General;  Laterality: Left;  . Abdominal hysterectomy Bilateral 1999    w/b/l salpingo-oopherectomy    FAMILY HISTORY Family History  Problem Relation Age of Onset  . Hypertension Mother   . Coronary artery disease Mother   . Hypertension Father   . Cancer Cousin     breast    GYNECOLOGIC HISTORY: menarche at age 15, g21 p3, s/p TAH/BSO in early 39s.  Was on HRT for several years (cannot recall the duration).  No h/o abnormal pap smears or sexually transmitted infections.    SOCIAL HISTORY: Lives in [place], [place] in a three story house.  Her husband lives at a nursing home due to dementia.  She is a retired Pharmacist, hospital.  Her son and daughter live in [place].      ADVANCED DIRECTIVES: Not in place.  HEALTH MAINTENANCE: History  Substance Use Topics  . Smoking status: Former Smoker -- 0.25 packs/day for 4 years    Types: Cigarettes  . Smokeless tobacco: Not on file     Comment: Cigarette use was 50 years ago  . Alcohol Use: Yes     Comment: Occ glass of wine with dinner      Mammogram: 08/21/2013 Colonoscopy: 2011 Bone Density Scan: 01/13/13 Pap Smear: s/p tah/bso Eye Exam: due Vitamin D Level:  Normal, 04/14/13 Lipid Panel: unsure   Allergies  Allergen Reactions  . Penicillins Hives and Swelling  . Radiaplexrx [Woun'Dres Hydrogel Wound Dress] Hives    Current Outpatient Prescriptions   Medication Sig Dispense Refill  . ACCU-CHEK FASTCLIX LANCETS MISC 1 each by Does not apply route daily.  102 each  3  . allopurinol (ZYLOPRIM) 300 MG tablet TAKE ONE TABLET BY MOUTH ONE TIME DAILY   90 tablet  3  . aspirin 81 MG tablet Take 81 mg by mouth daily.      Marland Kitchen atenolol (TENORMIN) 12.5 mg TABS tablet 1 by mouth every morning  100 tablet  3  . calcium-vitamin D 250-100 MG-UNIT per tablet Take 1 tablet by mouth 2 (two) times daily.      . Chlorpheniramine Maleate (CHLOR-TABLETS PO) Take by mouth.      . conjugated estrogens (PREMARIN) vaginal cream 1 Applicatorful. Apply small amounts twice weekly-  Pt reports only using rarely maybe once a week or once every 2 wks.      . fish oil-omega-3 fatty acids 1000 MG capsule Take 1 g by mouth daily.      Marland Kitchen glucose blood (ACCU-CHEK AVIVA) test strip 1 each by Other route 2 (two) times daily. Dx 250.00  100 each  12  . loratadine (CLARITIN) 10 MG tablet Take 10 mg by mouth daily.      . metFORMIN (GLUCOPHAGE) 500 MG tablet Take one tablet by mouth twice daily  180 tablet  2  . Multiple Vitamins-Minerals (CENTRUM SILVER) tablet Take 1 tablet by mouth daily.        Marland Kitchen OVER THE COUNTER MEDICATION Take by mouth 2 (two) times daily. Presavision- for macular degeneration prevention      . simvastatin (ZOCOR) 40 MG tablet Take 1 tablet (40 mg total) by mouth every evening.  100 tablet  3  . exemestane (AROMASIN) 25 MG tablet Take 1 tablet (25 mg total) by mouth daily after breakfast.  30 tablet  6   No current facility-administered medications for this visit.    OBJECTIVE: Filed Vitals:   09/21/13 1145  BP: 154/63  Pulse: 80  Temp: 97.8 F (36.6 C)  Resp: 17     Body mass index is 25.99 kg/(m^2).     GENERAL: Patient is a well appearing female in no acute distress HEENT:  Sclerae anicteric.  Oropharynx clear and moist. No ulcerations or evidence of oropharyngeal candidiasis. Neck is supple.  NODES:  No cervical, supraclavicular, or axillary  lymphadenopathy palpated.  BREAST EXAM:  Deferred. LUNGS:  Clear to auscultation bilaterally.  No wheezes or rhonchi. HEART:  Regular rate and rhythm. No murmur appreciated. ABDOMEN:  Soft, nontender.  Positive, normoactive bowel sounds. No organomegaly palpated. MSK:  No focal spinal tenderness to palpation. Full range of motion bilaterally in the upper extremities. EXTREMITIES:  No peripheral edema.   SKIN:  Clear with no obvious rashes or skin changes. No nail dyscrasia. NEURO:  Nonfocal. Well oriented.  Appropriate affect. ECOG FS:1 - Symptomatic  but completely ambulatory  LAB RESULTS:  CMP     Component Value Date/Time   NA 142 08/30/2013 0926   NA 141 09/20/2012 0852   K 3.9 08/30/2013 0926   K 4.4 09/20/2012 0852   CL 104 09/20/2012 1302   CL 104 09/20/2012 0852   CO2 27 08/30/2013 0926   CO2 28 09/20/2012 0852   GLUCOSE 130 08/30/2013 0926   GLUCOSE 108* 09/20/2012 1302   GLUCOSE 142* 09/20/2012 0852   GLUCOSE 147* 04/08/2006 0000   BUN 22.2 08/30/2013 0926   BUN 30* 09/20/2012 0852   CREATININE 1.1 08/30/2013 0926   CREATININE 1.2 09/20/2012 0852   CALCIUM 10.1 08/30/2013 0926   CALCIUM 9.7 09/20/2012 0852   PROT 6.9 08/30/2013 0926   PROT 7.3 09/08/2012 0935   ALBUMIN 3.9 08/30/2013 0926   ALBUMIN 3.6 09/08/2012 0935   AST 31 08/30/2013 0926   AST 25 09/08/2012 0935   ALT 40 08/30/2013 0926   ALT 20 09/08/2012 0935   ALKPHOS 85 08/30/2013 0926   ALKPHOS 74 09/08/2012 0935   BILITOT 0.40 08/30/2013 0926   BILITOT 0.5 09/08/2012 0935   GFRNONAA 52* 09/08/2012 0935   GFRAA 60* 09/08/2012 0935    I No results found for this basename: SPEP, UPEP,  kappa and lambda light chains    Lab Results  Component Value Date   WBC 7.5 09/21/2013   NEUTROABS 5.0 09/21/2013   HGB 14.2 09/21/2013   HCT 43.3 09/21/2013   MCV 95.9 09/21/2013   PLT 182 09/21/2013      Chemistry      Component Value Date/Time   NA 142 08/30/2013 0926   NA 141 09/20/2012 0852   K 3.9 08/30/2013 0926   K 4.4 09/20/2012 0852    CL 104 09/20/2012 1302   CL 104 09/20/2012 0852   CO2 27 08/30/2013 0926   CO2 28 09/20/2012 0852   BUN 22.2 08/30/2013 0926   BUN 30* 09/20/2012 0852   CREATININE 1.1 08/30/2013 0926   CREATININE 1.2 09/20/2012 0852      Component Value Date/Time   CALCIUM 10.1 08/30/2013 0926   CALCIUM 9.7 09/20/2012 0852   ALKPHOS 85 08/30/2013 0926   ALKPHOS 74 09/08/2012 0935   AST 31 08/30/2013 0926   AST 25 09/08/2012 0935   ALT 40 08/30/2013 0926   ALT 20 09/08/2012 0935   BILITOT 0.40 08/30/2013 0926   BILITOT 0.5 09/08/2012 0935       No results found for this basename: LABCA2    No components found with this basename: LABCA125    No results found for this basename: INR,  in the last 168 hours  Urinalysis    Component Value Date/Time   COLORURINE YELLOW 09/08/2012 0940   APPEARANCEUR CLEAR 09/08/2012 0940   LABSPEC 1.016 09/08/2012 0940   PHURINE 7.0 09/08/2012 0940   GLUCOSEU NEGATIVE 09/08/2012 0940   HGBUR NEGATIVE 09/08/2012 0940   HGBUR negative 05/28/2010 0902   BILIRUBINUR neg 05/10/2013 [place] 09/08/2012 0940   KETONESUR NEGATIVE 09/08/2012 0940   PROTEINUR neg 05/10/2013 1221   PROTEINUR NEGATIVE 09/08/2012 0940   UROBILINOGEN 0.2 05/10/2013 1221   UROBILINOGEN 1.0 09/08/2012 0940   NITRITE neg 05/10/2013 1221   NITRITE NEGATIVE 09/08/2012 0940   LEUKOCYTESUR large (3+) 05/10/2013 1221    STUDIES: No results found.  ASSESSMENT: 79 y.o. Adrienne [name], [place] woman with T1N0, stage IA invasive ductal carcinoma of the left breast, grade I, ER 100%, PR 88%,  Ki-67 16%, HER-2/neu negative.    1. Patient went on to have a lumpectomy with sentinel lymph node biopsy on 09/12/2012. The final pathology revealed a 0.8 cm invasive ductal carcinoma grade 1 measuring 0.8 cm ER +100% PR +88% HER-2/neu negative Ki-67 16%. One sentinel node was negative for metastatic disease. She did have a focal close margin of DCIS at less than 1 mm.   #2 s/p radiation therapy from 11/09/2012 through 12/19/2012.   #3  patient began Arimidex 1 mg daily in August 2014. Unfortunately she was not able to tolerate due to diarrhea, and it was stopped in January, 2015.   #4 Patient began Aromasin 25 mg daily in January, 2015 with a 5 year duration recommended.   PLAN:   Adrienne [name] is doing well.  I reviewed her health maintenance, gynecologic and social history with her in detail today.  Her rash was scabies and she was treated accordingly by Dermatology.  This has essentially resolved and she continues to f/u with dermatology regarding this.  Now that we know the etiology of the rash, she can restart her Aromasin daily.  I reviewed this with her in detail.  I recommended she partake in a healthy diet, exercise, and self breast exams.    The patient will return in 6 months for labs and a follow up appointment.   She knows to call us in the interim for any questions or concerns.  We can certainly see her sooner if needed.  I spent 25 minutes counseling the patient face to face.  The total time spent in the appointment was 30 minutes.  Minette Headland, [place] [phone] 09/21/2013 12:11 PM

## 2013-09-21 NOTE — Telephone Encounter (Signed)
cld & spoke w/pt to adv of appt time & date-pt understood

## 2013-09-21 NOTE — Patient Instructions (Signed)
You are doing well.  You can restart your Aromasin.  We will see you back in 6 months.  Please call us if you have any questions or concerns.    Continue with healthy diet, exercise, and self breast exams.   Breast Self-Awareness Practicing breast self-awareness may pick up problems early, prevent significant medical complications, and possibly save your life. By practicing breast self-awareness, you can become familiar with how your breasts look and feel and if your breasts are changing. This allows you to notice changes early. It can also offer you some reassurance that your breast health is good. One way to learn what is normal for your breasts and whether your breasts are changing is to do a breast self-exam. If you find a lump or something that was not present in the past, it is best to contact your caregiver right away. Other findings that should be evaluated by your caregiver include nipple discharge, especially if it is bloody; skin changes or reddening; areas where the skin seems to be pulled in (retracted); or new lumps and bumps. Breast pain is seldom associated with cancer (malignancy), but should also be evaluated by a caregiver. HOW TO PERFORM A BREAST SELF-EXAM The best time to examine your breasts is 5 7 days after your menstrual period is over. During menstruation, the breasts are lumpier, and it may be more difficult to pick up changes. If you do not menstruate, have reached menopause, or had your uterus removed (hysterectomy), you should examine your breasts at regular intervals, such as monthly. If you are breastfeeding, examine your breasts after a feeding or after using a breast pump. Breast implants do not decrease the risk for lumps or tumors, so continue to perform breast self-exams as recommended. Talk to your caregiver about how to determine the difference between the implant and breast tissue. Also, talk about the amount of pressure you should use during the exam. Over time, you  will become more familiar with the variations of your breasts and more comfortable with the exam. A breast self-exam requires you to remove all your clothes above the waist. 1. Look at your breasts and nipples. Stand in front of a mirror in a room with good lighting. With your hands on your hips, push your hands firmly downward. Look for a difference in shape, contour, and size from one breast to the other (asymmetry). Asymmetry includes puckers, dips, or bumps. Also, look for skin changes, such as reddened or scaly areas on the breasts. Look for nipple changes, such as discharge, dimpling, repositioning, or redness. 2. Carefully feel your breasts. This is best done either in the shower or tub while using soapy water or when flat on your back. Place the arm (on the side of the breast you are examining) above your head. Use the pads (not the fingertips) of your three middle fingers on your opposite hand to feel your breasts. Start in the underarm area and use  inch (2 cm) overlapping circles to feel your breast. Use 3 different levels of pressure (light, medium, and firm pressure) at each circle before moving to the next circle. The light pressure is needed to feel the tissue closest to the skin. The medium pressure will help to feel breast tissue a little deeper, while the firm pressure is needed to feel the tissue close to the ribs. Continue the overlapping circles, moving downward over the breast until you feel your ribs below your breast. Then, move one finger-width towards the center of the  body. Continue to use the  inch (2 cm) overlapping circles to feel your breast as you move slowly up toward the collar bone (clavicle) near the base of the neck. Continue the up and down exam using all 3 pressures until you reach the middle of the chest. Do this with each breast, carefully feeling for lumps or changes. 3.  Keep a written record with breast changes or normal findings for each breast. By writing this  information down, you do not need to depend only on memory for size, tenderness, or location. Write down where you are in your menstrual cycle, if you are still menstruating. Breast tissue can have some lumps or thick tissue. However, see your caregiver if you find anything that concerns you.  SEEK MEDICAL CARE IF:  You see a change in shape, contour, or size of your breasts or nipples.   You see skin changes, such as reddened or scaly areas on the breasts or nipples.   You have an unusual discharge from your nipples.   You feel a new lump or unusually thick areas.  Document Released: 04/20/2005 Document Revised: 04/06/2012 Document Reviewed: 08/05/2011 [facility] Patient Information 2014 [place].

## 2013-10-24 ENCOUNTER — Ambulatory Visit (INDEPENDENT_AMBULATORY_CARE_PROVIDER_SITE_OTHER): Payer: Medicare Other | Admitting: Family Medicine

## 2013-10-24 ENCOUNTER — Encounter: Payer: Self-pay | Admitting: Family Medicine

## 2013-10-24 VITALS — BP 130/90 | Temp 97.9°F | Wt 140.0 lb

## 2013-10-24 DIAGNOSIS — E119 Type 2 diabetes mellitus without complications: Secondary | ICD-10-CM

## 2013-10-24 LAB — HEMOGLOBIN A1C: HEMOGLOBIN A1C: 6.9 % — AB (ref 4.6–6.5)

## 2013-10-24 NOTE — Progress Notes (Signed)
   Subjective:    Patient ID: Adrienne [name], female    DOB: [birth year], 79 y.o.   MRN: [id number]  HPI Adrienne [name] is a 79 year old female who comes in today for followup of diabetes  He takes metformin 500 mg twice a day  Weight steady at 140 pounds  She had a blood sugar at the cancer center months ago was 104. Otherwise CBC etc. normal. She's had breast cancer and surgery last year also followed up by oncology.  She states she feels well and has no complaints   Review of Systems    review of systems otherwise negative last A1c 6.9% in November 2014 Objective:   Physical Exam  Well-developed well-nourished female no acute distress vital signs stable she is afebrile      Assessment & Plan:  Diabetes type 2 check labs followup CPX in November

## 2013-10-24 NOTE — Patient Instructions (Signed)
Lab work today  We will call you the report in a day or 2  Continue current medications  Followup physical examination November 2015

## 2013-10-24 NOTE — Progress Notes (Signed)
Pre visit review using our clinic review tool, if applicable. No additional management support is needed unless otherwise documented below in the visit note. 

## 2013-10-27 ENCOUNTER — Other Ambulatory Visit: Payer: Self-pay | Admitting: Family Medicine

## 2013-10-31 ENCOUNTER — Other Ambulatory Visit: Payer: Self-pay | Admitting: Family Medicine

## 2013-11-08 ENCOUNTER — Ambulatory Visit (INDEPENDENT_AMBULATORY_CARE_PROVIDER_SITE_OTHER): Payer: Medicare Other | Admitting: General Surgery

## 2013-11-08 ENCOUNTER — Encounter (INDEPENDENT_AMBULATORY_CARE_PROVIDER_SITE_OTHER): Payer: Self-pay | Admitting: General Surgery

## 2013-11-08 VITALS — BP 126/78 | HR 72 | Ht 62.0 in | Wt 139.0 lb

## 2013-11-08 DIAGNOSIS — C50912 Malignant neoplasm of unspecified site of left female breast: Secondary | ICD-10-CM

## 2013-11-08 DIAGNOSIS — C50919 Malignant neoplasm of unspecified site of unspecified female breast: Secondary | ICD-10-CM

## 2013-11-08 NOTE — Progress Notes (Signed)
Chief complaint: Followup cancer left breast  History: Patient returns for long-term followup status post left breast lumpectomy and negative sentinel lymph node biopsy, radiation therapy and now on adjuvant Aromasin for stage IA invasive ductal carcinoma, ER positive. Primary tumor was 8 mm with associated DCIS that was close, 1 mm to one margin focally.  Tertiary date of May 2014. She  completed her radiation therapy without difficulty.  She denies any changes in her breasts specifically skin changes or lump or nipple discharge or pain. She had a unilateral left mammogram  prior to beginning her radiation but showed no residual calcifications.  Exam: BP 126/78  Pulse 72  Ht 5\' 2"  (1.575 m)  Wt 139 lb (63.05 kg)  BMI 25.42 kg/m2 General: Elderly Caucasian female appears well Skin: No rash or infection Lymph nodes: No cervical, supraclavicular or axillary nodes palpable Lungs: Clear equal breath sounds bilaterally Breasts: Well-healed lumpectomy incision upper-outer left breast. Minimal post radiation changes. No palpable masses in either breast particular attention to the lumpectomy site and again no palpable axillary adenopathy  Mammogram in April 2015 was negative for evidence of malignancy  Assessment and plan: Doing well following treatment for stage IA cancer left breast as above. No evidence of early recurrence or complications for treatment. Return in 6 months

## 2013-12-30 ENCOUNTER — Other Ambulatory Visit: Payer: Self-pay | Admitting: Family Medicine

## 2014-02-17 ENCOUNTER — Other Ambulatory Visit: Payer: Self-pay | Admitting: Oncology

## 2014-02-17 DIAGNOSIS — C50912 Malignant neoplasm of unspecified site of left female breast: Secondary | ICD-10-CM

## 2014-03-19 ENCOUNTER — Other Ambulatory Visit: Payer: Self-pay | Admitting: Family Medicine

## 2014-03-23 ENCOUNTER — Other Ambulatory Visit (HOSPITAL_BASED_OUTPATIENT_CLINIC_OR_DEPARTMENT_OTHER): Payer: Medicare Other

## 2014-03-23 ENCOUNTER — Encounter: Payer: Self-pay | Admitting: Adult Health

## 2014-03-23 ENCOUNTER — Telehealth: Payer: Self-pay | Admitting: Adult Health

## 2014-03-23 ENCOUNTER — Ambulatory Visit (HOSPITAL_BASED_OUTPATIENT_CLINIC_OR_DEPARTMENT_OTHER): Payer: Medicare Other | Admitting: Adult Health

## 2014-03-23 VITALS — BP 140/72 | HR 89 | Temp 97.6°F | Resp 18 | Ht 62.0 in | Wt 144.1 lb

## 2014-03-23 DIAGNOSIS — C50112 Malignant neoplasm of central portion of left female breast: Secondary | ICD-10-CM

## 2014-03-23 DIAGNOSIS — C50919 Malignant neoplasm of unspecified site of unspecified female breast: Secondary | ICD-10-CM

## 2014-03-23 DIAGNOSIS — Z170 Estrogen receptor positive status [ER+]: Secondary | ICD-10-CM

## 2014-03-23 DIAGNOSIS — C50912 Malignant neoplasm of unspecified site of left female breast: Secondary | ICD-10-CM

## 2014-03-23 LAB — COMPREHENSIVE METABOLIC PANEL (CC13)
ALT: 19 U/L (ref 0–55)
AST: 19 U/L (ref 5–34)
Albumin: 4 g/dL (ref 3.5–5.0)
Alkaline Phosphatase: 92 U/L (ref 40–150)
Anion Gap: 9 mEq/L (ref 3–11)
BILIRUBIN TOTAL: 0.61 mg/dL (ref 0.20–1.20)
BUN: 23.4 mg/dL (ref 7.0–26.0)
CO2: 27 mEq/L (ref 22–29)
CREATININE: 1.2 mg/dL — AB (ref 0.6–1.1)
Calcium: 10.5 mg/dL — ABNORMAL HIGH (ref 8.4–10.4)
Chloride: 106 mEq/L (ref 98–109)
Glucose: 116 mg/dl (ref 70–140)
Potassium: 4 mEq/L (ref 3.5–5.1)
Sodium: 142 mEq/L (ref 136–145)
Total Protein: 6.9 g/dL (ref 6.4–8.3)

## 2014-03-23 LAB — CBC WITH DIFFERENTIAL/PLATELET
BASO%: 0.8 % (ref 0.0–2.0)
Basophils Absolute: 0.1 10*3/uL (ref 0.0–0.1)
EOS%: 3.1 % (ref 0.0–7.0)
Eosinophils Absolute: 0.2 10*3/uL (ref 0.0–0.5)
HCT: 42.4 % (ref 34.8–46.6)
HGB: 14 g/dL (ref 11.6–15.9)
LYMPH#: 1.6 10*3/uL (ref 0.9–3.3)
LYMPH%: 25.2 % (ref 14.0–49.7)
MCH: 30.9 pg (ref 25.1–34.0)
MCHC: 33 g/dL (ref 31.5–36.0)
MCV: 93.6 fL (ref 79.5–101.0)
MONO#: 0.6 10*3/uL (ref 0.1–0.9)
MONO%: 9.7 % (ref 0.0–14.0)
NEUT#: 4 10*3/uL (ref 1.5–6.5)
NEUT%: 61.2 % (ref 38.4–76.8)
PLATELETS: 185 10*3/uL (ref 145–400)
RBC: 4.53 10*6/uL (ref 3.70–5.45)
RDW: 13.9 % (ref 11.2–14.5)
WBC: 6.5 10*3/uL (ref 3.9–10.3)

## 2014-03-23 NOTE — Progress Notes (Signed)
ID: Adrienne [name] OB: 06-Nov-1934  MR#: [id number]  [facility]#:[id number]  PCP: Joycelyn Man, MD GYN:   SU:  OTHER MD:  CHIEF COMPLAINT: breast cancer f/u  BREAST CANCER HISTORY:  Routine screening mammogram performed that showed an abnormality in the left breastand this was confirmed by ultrasound.Adrienne [name] were done at [facility]Adrienne [name] went on to have a core needle biopsy performed that showed invasive ductal carcinoma with associated DCIS and calcifications. Tumor was ER positive PR positive Adrienne [name]-2/neu negative with Ki-67 of 18%. Patient had MRI of the breasts performed on 08/29/2012 the MRI revealed the following: Within the upper central portion of the left breast enhancing mass measuring 1.2 x 1.2 x 1.2 cm. No others suspicious abnormalities were found.  CURRENT THERAPY: Aromasin daily  INTERVAL HISTORY: Adrienne [name] is doing well today.  Adrienne [name] is here for follow up of Adrienne [name] invasive ductal carcinoma.  Adrienne [name] is taking Aromasin daily and is tolerating it well.  Adrienne [name] has occasional joint aches, but doesn't attribute it to the Aromasin.  Adrienne [name] denies hot flashes or dryness.  Adrienne [name] denies new pain, or unintentional weight loss.  Otherwise, a 10 point ROS is neg.    REVIEW OF SYSTEMS: A 10 point review of systems was conducted and is otherwise negative except for what is noted above.     PAST MEDICAL HISTORY: Past Medical History  Diagnosis Date  . Hyperlipidemia   . Hypertension     Does not see a cardiologist  . History of frequent urinary tract infections   . Breast cancer 08/25/12    Invasive ductal ca,DCIS  . Allergy   . Diabetes mellitus     type II  . Hearing loss   . Gout   . Osteopenia   . Hx of radiation therapy 11/22/12- 12/19/12    breast 4250 cGy 17 sessions, left breast boost 750 cGy 3 sessions    PAST SURGICAL HISTORY: Past Surgical History  Procedure Laterality Date  . Appendectomy    . Cholecystectomy    . Total abdominal hysterectomy w/ bilateral salpingoophorectomy    .  Breast surgery  1990's    right- fibroid cyst  . Septoplasty    . Breast lumpectomy with needle localization and axillary sentinel lymph node bx Left 09/12/2012    Procedure: LEFT NEEDLE LOCALIZATION BREAST LUMPECTOMY TION AND AXILLARY SENTINEL LYMPH NODE BX;  Surgeon: Edward Jolly, MD;  Location: [facility];  Service: General;  Laterality: Left;  . Abdominal hysterectomy Bilateral 1999    w/b/l salpingo-oopherectomy    FAMILY HISTORY Family History  Problem Relation Age of Onset  . Hypertension Mother   . Coronary artery disease Mother   . Hypertension Father   . Cancer Cousin     breast    GYNECOLOGIC HISTORY: menarche at age 75, g11 p3, s/p TAH/BSO in early 57s.  Was on HRT for several years (cannot recall the duration).  No h/o abnormal pap smears or sexually transmitted infections.    SOCIAL HISTORY: Lives in [place], [place] in a three story house.  Adrienne [name] husband lives at a nursing home due to dementia.  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  Adrienne [name] son and daughter live in [place].      ADVANCED DIRECTIVES: Not in place.    HEALTH MAINTENANCE: History  Substance Use Topics  . Smoking status: Former Smoker -- 0.25 packs/day for 4 years    Types: Cigarettes  . Smokeless tobacco: Not on file     Comment: Cigarette use was 50 years ago  .  Alcohol Use: Yes     Comment: Occ glass of wine with dinner      Mammogram: 08/21/2013 Colonoscopy: 2011 Bone Density Scan: 01/13/13, osteopenia Pap Smear: s/p tah/bso Eye Exam: due Vitamin D Level:  Normal, 04/14/13 Lipid Panel: unsure   Allergies  Allergen Reactions  . Penicillins Hives and Swelling  . Radiaplexrx [Woun'Dres Hydrogel Wound Dress] Hives    Current Outpatient Prescriptions  Medication Sig Dispense Refill  . ACCU-CHEK AVIVA PLUS test strip test twice daily 100 each 11  . ACCU-CHEK FASTCLIX LANCETS MISC 1 each by Does not apply route daily. 102 each 3  . allopurinol (ZYLOPRIM) 300 MG tablet TAKE ONE TABLET BY MOUTH ONE  TIME DAILY  90 tablet 3  . aspirin 81 MG tablet Take 81 mg by mouth daily.    Marland Kitchen atenolol (TENORMIN) 12.5 mg TABS tablet 1 by mouth every morning 100 tablet 3  . calcium-vitamin D 250-100 MG-UNIT per tablet Take 1 tablet by mouth 2 (two) times daily.    . Chlorpheniramine Maleate (CHLOR-TABLETS PO) Take by mouth.    . conjugated estrogens (PREMARIN) vaginal cream 1 Applicatorful. Apply small amounts twice weekly-  Pt reports only using rarely maybe once a week or once every 2 wks.    Marland Kitchen exemestane (AROMASIN) 25 MG tablet Take 1 capsule by mouth daily after breakfast 30 tablet 1  . fish oil-omega-3 fatty acids 1000 MG capsule Take 1 g by mouth daily.    Marland Kitchen loratadine (CLARITIN) 10 MG tablet Take 10 mg by mouth daily.    . metFORMIN (GLUCOPHAGE) 500 MG tablet TAKE ONE TABLET BY MOUTH TWICE DAILY  180 tablet 1  . Multiple Vitamins-Minerals (CENTRUM SILVER) tablet Take 1 tablet by mouth daily.      Marland Kitchen OVER THE COUNTER MEDICATION Take by mouth 2 (two) times daily. Presavision- for macular degeneration prevention    . simvastatin (ZOCOR) 40 MG tablet TAKE ONE TABLET BY MOUTH ONE TIME DAILY IN THE P.M.  90 tablet 1   No current facility-administered medications for this visit.    OBJECTIVE: Filed Vitals:   03/23/14 1142  BP: 140/72  Pulse: 89  Temp: 97.6 F (36.4 C)  Resp: 18     Body mass index is 26.34 kg/(m^2).     GENERAL: Patient is a well appearing female in no acute distress HEENT:  Sclerae anicteric.  Oropharynx clear and moist. No ulcerations or evidence of oropharyngeal candidiasis. Neck is supple.  NODES:  No cervical, supraclavicular, or axillary lymphadenopathy palpated.  BREAST EXAM: left breast s/p lumpectomy, no nodularity, no masses, right breast no masses or nodules, benign bilateral breast exam. LUNGS:  Clear to auscultation bilaterally.  No wheezes or rhonchi. HEART:  Regular rate and rhythm. No murmur appreciated. ABDOMEN:  Soft, nontender.  Positive, normoactive bowel  sounds. No organomegaly palpated. MSK:  No focal spinal tenderness to palpation. Full range of motion bilaterally in the upper extremities. EXTREMITIES:  No peripheral edema.   SKIN:  Clear with no obvious rashes or skin changes. No nail dyscrasia. NEURO:  Nonfocal. Well oriented.  Appropriate affect. ECOG FS:1 - Symptomatic but completely ambulatory  LAB RESULTS:  CMP     Component Value Date/Time   NA 141 09/21/2013 1130   NA 141 09/20/2012 0852   K 3.8 09/21/2013 1130   K 4.4 09/20/2012 0852   CL 104 09/20/2012 1302   CL 104 09/20/2012 0852   CO2 25 09/21/2013 1130   CO2 28 09/20/2012 0852   GLUCOSE  104 09/21/2013 1130   GLUCOSE 108* 09/20/2012 1302   GLUCOSE 142* 09/20/2012 0852   GLUCOSE 147* 04/08/2006 0000   BUN 28.4* 09/21/2013 1130   BUN 30* 09/20/2012 0852   CREATININE 1.1 09/21/2013 1130   CREATININE 1.2 09/20/2012 0852   CALCIUM 10.6* 09/21/2013 1130   CALCIUM 9.7 09/20/2012 0852   PROT 6.8 09/21/2013 1130   PROT 7.3 09/08/2012 0935   ALBUMIN 3.9 09/21/2013 1130   ALBUMIN 3.6 09/08/2012 0935   AST 18 09/21/2013 1130   AST 25 09/08/2012 0935   ALT 20 09/21/2013 1130   ALT 20 09/08/2012 0935   ALKPHOS 83 09/21/2013 1130   ALKPHOS 74 09/08/2012 0935   BILITOT 0.61 09/21/2013 1130   BILITOT 0.5 09/08/2012 0935   GFRNONAA 52* 09/08/2012 0935   GFRAA 60* 09/08/2012 0935    I No results found for: SPEP  Lab Results  Component Value Date   WBC 6.5 03/23/2014   NEUTROABS 4.0 03/23/2014   HGB 14.0 03/23/2014   HCT 42.4 03/23/2014   MCV 93.6 03/23/2014   PLT 185 03/23/2014      Chemistry      Component Value Date/Time   NA 141 09/21/2013 1130   NA 141 09/20/2012 0852   K 3.8 09/21/2013 1130   K 4.4 09/20/2012 0852   CL 104 09/20/2012 1302   CL 104 09/20/2012 0852   CO2 25 09/21/2013 1130   CO2 28 09/20/2012 0852   BUN 28.4* 09/21/2013 1130   BUN 30* 09/20/2012 0852   CREATININE 1.1 09/21/2013 1130   CREATININE 1.2 09/20/2012 0852       Component Value Date/Time   CALCIUM 10.6* 09/21/2013 1130   CALCIUM 9.7 09/20/2012 0852   ALKPHOS 83 09/21/2013 1130   ALKPHOS 74 09/08/2012 0935   AST 18 09/21/2013 1130   AST 25 09/08/2012 0935   ALT 20 09/21/2013 1130   ALT 20 09/08/2012 0935   BILITOT 0.61 09/21/2013 1130   BILITOT 0.5 09/08/2012 0935       No results found for: LABCA2  No components found for: LABCA125  No results for input(s): INR in the last 168 hours.  Urinalysis    Component Value Date/Time   COLORURINE YELLOW 09/08/2012 0940   APPEARANCEUR CLEAR 09/08/2012 0940   LABSPEC 1.016 09/08/2012 0940   PHURINE 7.0 09/08/2012 0940   GLUCOSEU NEGATIVE 09/08/2012 0940   HGBUR NEGATIVE 09/08/2012 0940   HGBUR negative 05/28/2010 0902   BILIRUBINUR neg 05/10/2013 1221   BILIRUBINUR NEGATIVE 09/08/2012 0940   KETONESUR NEGATIVE 09/08/2012 0940   PROTEINUR neg 05/10/2013 1221   PROTEINUR NEGATIVE 09/08/2012 0940   UROBILINOGEN 0.2 05/10/2013 1221   UROBILINOGEN 1.0 09/08/2012 0940   NITRITE neg 05/10/2013 1221   NITRITE NEGATIVE 09/08/2012 0940   LEUKOCYTESUR large (3+) 05/10/2013 1221    STUDIES: No results found.  ASSESSMENT: 79 y.o. [place], [place] woman with T1N0, stage IA invasive ductal carcinoma of the left breast, grade I, ER 100%, PR 88%, Ki-67 16%, Adrienne [name]-2/neu negative.    1. Patient went on to have a lumpectomy with sentinel lymph node biopsy on 09/12/2012. The final pathology revealed a 0.8 cm invasive ductal carcinoma grade 1 measuring 0.8 cm ER +100% PR +88% Adrienne [name]-2/neu negative Ki-67 16%. One sentinel node was negative for metastatic disease. Adrienne [name] did have a focal close margin of DCIS at less than 1 mm.   #2 s/p radiation therapy from 11/09/2012 through 12/19/2012.   #3 patient began Arimidex 1 mg daily in August  2014. Unfortunately Adrienne [name] was not able to tolerate due to diarrhea, and it was stopped in January, 2015.   #4 Patient began Aromasin 25 mg daily in January, 2015 with a 5 year  duration recommended.   PLAN:  Samiksha is doing well today.  Adrienne [name] has restarted the Aromasin and is tolerating it well.  Adrienne [name] are stable, I reviewed them with Adrienne [name] in detail.  Adrienne [name] has no sign of recurrence and will continue taking Aromasin daily.  Adrienne [name] does have osteopenia and I recommended calcium, vitamin d intake and weight bearing exercises.    Adrienne [name] will return in 6 months for [name] and an office visit with Dr. Burr Medico.    Adrienne [name] knows to call us in the interim for any questions or concerns.  We can certainly see Adrienne [name] sooner if needed.  I spent 25 minutes counseling the patient face to face.  The total time spent in the appointment was 30 minutes.  Minette Headland, [place] [phone] 03/23/2014 12:03 PM

## 2014-03-23 NOTE — Telephone Encounter (Signed)
, °

## 2014-03-23 NOTE — Patient Instructions (Signed)
You are doing well and have no sign of breast cancer recurrence.  Continue taking Aromasin daily.  I recommend healthy diet, exercise, and monthly breast exams.    Breast Self-Awareness Practicing breast self-awareness may pick up problems early, prevent significant medical complications, and possibly save your life. By practicing breast self-awareness, you can become familiar with how your breasts look and feel and if your breasts are changing. This allows you to notice changes early. It can also offer you some reassurance that your breast health is good. One way to learn what is normal for your breasts and whether your breasts are changing is to do a breast self-exam. If you find a lump or something that was not present in the past, it is best to contact your caregiver right away. Other findings that should be evaluated by your caregiver include nipple discharge, especially if it is bloody; skin changes or reddening; areas where the skin seems to be pulled in (retracted); or new lumps and bumps. Breast pain is seldom associated with cancer (malignancy), but should also be evaluated by a caregiver. HOW TO PERFORM A BREAST SELF-EXAM The best time to examine your breasts is 5-7 days after your menstrual period is over. During menstruation, the breasts are lumpier, and it may be more difficult to pick up changes. If you do not menstruate, have reached menopause, or had your uterus removed (hysterectomy), you should examine your breasts at regular intervals, such as monthly. If you are breastfeeding, examine your breasts after a feeding or after using a breast pump. Breast implants do not decrease the risk for lumps or tumors, so continue to perform breast self-exams as recommended. Talk to your caregiver about how to determine the difference between the implant and breast tissue. Also, talk about the amount of pressure you should use during the exam. Over time, you will become more familiar with the variations  of your breasts and more comfortable with the exam. A breast self-exam requires you to remove all your clothes above the waist. 1. Look at your breasts and nipples. Stand in front of a mirror in a room with good lighting. With your hands on your hips, push your hands firmly downward. Look for a difference in shape, contour, and size from one breast to the other (asymmetry). Asymmetry includes puckers, dips, or bumps. Also, look for skin changes, such as reddened or scaly areas on the breasts. Look for nipple changes, such as discharge, dimpling, repositioning, or redness. 2. Carefully feel your breasts. This is best done either in the shower or tub while using soapy water or when flat on your back. Place the arm (on the side of the breast you are examining) above your head. Use the pads (not the fingertips) of your three middle fingers on your opposite hand to feel your breasts. Start in the underarm area and use  inch (2 cm) overlapping circles to feel your breast. Use 3 different levels of pressure (light, medium, and firm pressure) at each circle before moving to the next circle. The light pressure is needed to feel the tissue closest to the skin. The medium pressure will help to feel breast tissue a little deeper, while the firm pressure is needed to feel the tissue close to the ribs. Continue the overlapping circles, moving downward over the breast until you feel your ribs below your breast. Then, move one finger-width towards the center of the body. Continue to use the  inch (2 cm) overlapping circles to feel your breast  as you move slowly up toward the collar bone (clavicle) near the base of the neck. Continue the up and down exam using all 3 pressures until you reach the middle of the chest. Do this with each breast, carefully feeling for lumps or changes. 3.  Keep a written record with breast changes or normal findings for each breast. By writing this information down, you do not need to depend only  on memory for size, tenderness, or location. Write down where you are in your menstrual cycle, if you are still menstruating. Breast tissue can have some lumps or thick tissue. However, see your caregiver if you find anything that concerns you.  SEEK MEDICAL CARE IF:  You see a change in shape, contour, or size of your breasts or nipples.   You see skin changes, such as reddened or scaly areas on the breasts or nipples.   You have an unusual discharge from your nipples.   You feel a new lump or unusually thick areas.  Document Released: 04/20/2005 Document Revised: 04/06/2012 Document Reviewed: 08/05/2011 [facility] Patient Information 2015 [place], [place]. This information is not intended to replace advice given to you by your health care provider. Make sure you discuss any questions you have with your health care provider.

## 2014-03-27 ENCOUNTER — Ambulatory Visit (INDEPENDENT_AMBULATORY_CARE_PROVIDER_SITE_OTHER): Payer: Medicare Other | Admitting: Family Medicine

## 2014-03-27 ENCOUNTER — Encounter: Payer: Self-pay | Admitting: Family Medicine

## 2014-03-27 VITALS — BP 110/80 | Ht 62.0 in | Wt 140.0 lb

## 2014-03-27 DIAGNOSIS — E1310 Other specified diabetes mellitus with ketoacidosis without coma: Secondary | ICD-10-CM

## 2014-03-27 DIAGNOSIS — M109 Gout, unspecified: Secondary | ICD-10-CM

## 2014-03-27 DIAGNOSIS — Z23 Encounter for immunization: Secondary | ICD-10-CM

## 2014-03-27 DIAGNOSIS — N952 Postmenopausal atrophic vaginitis: Secondary | ICD-10-CM

## 2014-03-27 DIAGNOSIS — I10 Essential (primary) hypertension: Secondary | ICD-10-CM

## 2014-03-27 DIAGNOSIS — E1110 Type 2 diabetes mellitus with ketoacidosis without coma: Secondary | ICD-10-CM

## 2014-03-27 DIAGNOSIS — E785 Hyperlipidemia, unspecified: Secondary | ICD-10-CM

## 2014-03-27 LAB — HEPATIC FUNCTION PANEL
ALBUMIN: 4.6 g/dL (ref 3.5–5.2)
ALT: 24 U/L (ref 0–35)
AST: 29 U/L (ref 0–37)
Alkaline Phosphatase: 87 U/L (ref 39–117)
Bilirubin, Direct: 0.1 mg/dL (ref 0.0–0.3)
TOTAL PROTEIN: 7.4 g/dL (ref 6.0–8.3)
Total Bilirubin: 0.7 mg/dL (ref 0.2–1.2)

## 2014-03-27 LAB — CBC WITH DIFFERENTIAL/PLATELET
BASOS ABS: 0.1 10*3/uL (ref 0.0–0.1)
Basophils Relative: 0.9 % (ref 0.0–3.0)
EOS PCT: 2.6 % (ref 0.0–5.0)
Eosinophils Absolute: 0.2 10*3/uL (ref 0.0–0.7)
HEMATOCRIT: 45.9 % (ref 36.0–46.0)
HEMOGLOBIN: 15 g/dL (ref 12.0–15.0)
LYMPHS PCT: 23.9 % (ref 12.0–46.0)
Lymphs Abs: 1.6 10*3/uL (ref 0.7–4.0)
MCHC: 32.7 g/dL (ref 30.0–36.0)
MCV: 95 fl (ref 78.0–100.0)
MONOS PCT: 10.5 % (ref 3.0–12.0)
Monocytes Absolute: 0.7 10*3/uL (ref 0.1–1.0)
Neutro Abs: 4.1 10*3/uL (ref 1.4–7.7)
Neutrophils Relative %: 62.1 % (ref 43.0–77.0)
Platelets: 192 10*3/uL (ref 150.0–400.0)
RBC: 4.84 Mil/uL (ref 3.87–5.11)
RDW: 13.7 % (ref 11.5–15.5)
WBC: 6.7 10*3/uL (ref 4.0–10.5)

## 2014-03-27 LAB — MICROALBUMIN / CREATININE URINE RATIO
CREATININE, U: 147.7 mg/dL
MICROALB UR: 15.7 mg/dL — AB (ref 0.0–1.9)
Microalb Creat Ratio: 10.6 mg/g (ref 0.0–30.0)

## 2014-03-27 LAB — BASIC METABOLIC PANEL
BUN: 19 mg/dL (ref 6–23)
CALCIUM: 10 mg/dL (ref 8.4–10.5)
CO2: 28 mEq/L (ref 19–32)
Chloride: 104 mEq/L (ref 96–112)
Creatinine, Ser: 1.1 mg/dL (ref 0.4–1.2)
GFR: 49.1 mL/min — AB (ref >60.00)
Glucose, Bld: 154 mg/dL — ABNORMAL HIGH (ref 70–99)
POTASSIUM: 5 meq/L (ref 3.5–5.1)
SODIUM: 145 meq/L (ref 135–145)

## 2014-03-27 LAB — POCT URINALYSIS DIPSTICK
BILIRUBIN UA: NEGATIVE
GLUCOSE UA: NEGATIVE
KETONES UA: NEGATIVE
NITRITE UA: NEGATIVE
RBC UA: NEGATIVE
Spec Grav, UA: 1.015
Urobilinogen, UA: 0.2
pH, UA: 7

## 2014-03-27 LAB — LIPID PANEL
CHOL/HDL RATIO: 3
Cholesterol: 144 mg/dL (ref 0–200)
HDL: 53.4 mg/dL (ref >39.00)
LDL CALC: 63 mg/dL (ref 0–99)
NONHDL: 90.6
TRIGLYCERIDES: 139 mg/dL (ref 0.0–149.0)
VLDL: 27.8 mg/dL (ref 0.0–40.0)

## 2014-03-27 LAB — TSH: TSH: 2.98 u[IU]/mL (ref 0.35–4.50)

## 2014-03-27 LAB — HEMOGLOBIN A1C: Hgb A1c MFr Bld: 7.5 % — ABNORMAL HIGH (ref 4.6–6.5)

## 2014-03-27 MED ORDER — ALLOPURINOL 300 MG PO TABS: 300.0000 mg | ORAL_TABLET | Freq: Every day | ORAL | Status: DC

## 2014-03-27 MED ORDER — ESTROGENS, CONJUGATED 0.625 MG/GM VA CREA: TOPICAL_CREAM | VAGINAL | Status: DC

## 2014-03-27 MED ORDER — METFORMIN HCL 500 MG PO TABS: 500.0000 mg | ORAL_TABLET | Freq: Two times a day (BID) | ORAL | Status: DC

## 2014-03-27 NOTE — Progress Notes (Signed)
Pre visit review using our clinic review tool, if applicable. No additional management support is needed unless otherwise documented below in the visit note. Lab Results  Component Value Date   HGBA1C 6.9* 10/24/2013   HGBA1C 6.9* 03/16/2013   HGBA1C 6.7* 09/20/2012   Lab Results  Component Value Date   MICROALBUR 0.1 03/16/2013   LDLCALC 70 05/25/2011   CREATININE 1.2* 03/23/2014

## 2014-03-27 NOTE — Progress Notes (Signed)
   Subjective:    Patient ID: LEGNA MAUSOLF, female    DOB: [birth year], 79 y.o.   MRN: [id number]  HPI Yisell is a 79 year old widowed female nonsmoker...Marland KitchenMarland KitchenMarland Kitchen she has a son and daughter here in [place].... However she lives alone........ who comes in today for evaluation of diabetes hyperlipidemia hypertension and gout  Her med review was accurate  She gets routine eye care dental care and is currently being treated at the cancer center with a long-term medication aromasin 25 mg daily. This is for her breast cancer.  Cognitive function normal she does not exercise on a daily basis home health safety reviewed no issues identified, no guns in the house, she does not have a healthcare power of attorney nor living well   Review of Systems  Constitutional: Negative.   HENT: Negative.   Eyes: Negative.   Respiratory: Negative.   Cardiovascular: Negative.   Gastrointestinal: Negative.   Endocrine: Negative.   Genitourinary: Negative.   Musculoskeletal: Negative.   Skin: Negative.   Allergic/Immunologic: Negative.   Neurological: Negative.   Hematological: Negative.   Psychiatric/Behavioral: Negative.        Objective:   Physical Exam  Constitutional: She appears well-developed and well-nourished.  HENT:  Head: Normocephalic and atraumatic.  Right Ear: External ear normal.  Left Ear: External ear normal.  Nose: Nose normal.  Mouth/Throat: Oropharynx is clear and moist.  Eyes: EOM are normal. Pupils are equal, round, and reactive to light.  Neck: Normal range of motion. Neck supple. No JVD present. No tracheal deviation present. No thyromegaly present.  Cardiovascular: Normal rate, regular rhythm, normal heart sounds and intact distal pulses.  Exam reveals no gallop and no friction rub.   No murmur heard. Pulmonary/Chest: Effort normal and breath sounds normal. No stridor. No respiratory distress. She has no wheezes. She has no rales. She exhibits no tenderness.  Abdominal:  Soft. Bowel sounds are normal. She exhibits no distension and no mass. There is no tenderness. There is no rebound and no guarding.  Genitourinary:  Bilateral breast exam normal except for tattoos from previous radiation therapy  Musculoskeletal: Normal range of motion.  Lymphadenopathy:    She has no cervical adenopathy.  Neurological: She is alert. She has normal reflexes. No cranial nerve deficit. She exhibits normal muscle tone. Coordination normal.  Skin: Skin is warm and dry. No rash noted. No erythema. No pallor.  Psychiatric: She has a normal mood and affect. Her behavior is normal. Judgment and thought content normal.  Nursing note and vitals reviewed.         Assessment & Plan:  History of gout...Marland KitchenMarland KitchenMarland Kitchen continue allopurinol  Hypertension....... continue Tenormin 12.5 mg daily  Diabetes type 2......Marland Kitchen metformin 500 twice a day........ check labs  History of hyperlipidemia.Marland Kitchen... Zocor 40 mg daily  Status post breast cancer.............. followed in the cancer center

## 2014-03-27 NOTE — Patient Instructions (Signed)
Continue current medications except stop the Zocor  Follow-up in 1 year sooner if any problems

## 2014-03-28 ENCOUNTER — Telehealth: Payer: Self-pay | Admitting: Family Medicine

## 2014-03-28 NOTE — Telephone Encounter (Signed)
emmi emailed °

## 2014-04-13 ENCOUNTER — Other Ambulatory Visit: Payer: Self-pay | Admitting: *Deleted

## 2014-04-13 DIAGNOSIS — C50912 Malignant neoplasm of unspecified site of left female breast: Secondary | ICD-10-CM

## 2014-04-13 MED ORDER — EXEMESTANE 25 MG PO TABS: 25.0000 mg | ORAL_TABLET | Freq: Every day | ORAL | Status: DC

## 2014-04-23 ENCOUNTER — Other Ambulatory Visit: Payer: Self-pay | Admitting: Family Medicine

## 2014-05-29 LAB — HM DIABETES EYE EXAM

## 2014-06-07 ENCOUNTER — Encounter: Payer: Self-pay | Admitting: Family Medicine

## 2014-07-30 ENCOUNTER — Telehealth: Payer: Self-pay | Admitting: Family Medicine

## 2014-07-30 ENCOUNTER — Ambulatory Visit (INDEPENDENT_AMBULATORY_CARE_PROVIDER_SITE_OTHER): Payer: Medicare Other | Admitting: Internal Medicine

## 2014-07-30 ENCOUNTER — Encounter: Payer: Self-pay | Admitting: Internal Medicine

## 2014-07-30 VITALS — BP 144/70 | Temp 97.9°F | Wt 142.0 lb

## 2014-07-30 DIAGNOSIS — I10 Essential (primary) hypertension: Secondary | ICD-10-CM

## 2014-07-30 DIAGNOSIS — Z853 Personal history of malignant neoplasm of breast: Secondary | ICD-10-CM

## 2014-07-30 DIAGNOSIS — E1165 Type 2 diabetes mellitus with hyperglycemia: Secondary | ICD-10-CM | POA: Diagnosis not present

## 2014-07-30 DIAGNOSIS — IMO0001 Reserved for inherently not codable concepts without codable children: Secondary | ICD-10-CM

## 2014-07-30 MED ORDER — ATENOLOL 25 MG PO TABS: ORAL_TABLET | ORAL | Status: DC

## 2014-07-30 NOTE — Progress Notes (Signed)
Pre visit review using our clinic review tool, if applicable. No additional management support is needed unless otherwise documented below in the visit note.  Chief Complaint  Patient presents with  . Hypertension    Increased since last Thursday.    HPI: Patient Adrienne [name]  comes in today for SDA for  Acute new problem evaluation. PCP NA sent in by team heallth for acute evaluation 24 hours .   HT:  Recently  took readings cause wasn't feeling good  And was 166/81`   Same readings.  Decreased med in past year  To 1/2 tab   dosent remember when .  Not confirmed bealisy by EHR . No cp sob syncope sugars up and down no lows .  comes in cause but in  150 160 range   hasnt really been checking readings until past week.   ROS: See pertinent positives and negatives per HPI.no falling  Events fever  Past Medical History  Diagnosis Date  . Hyperlipidemia   . Hypertension     Does not see a cardiologist  . History of frequent urinary tract infections   . Breast cancer 08/25/12    Invasive ductal ca,DCIS  . Allergy   . Diabetes mellitus     type II  . Hearing loss   . Gout   . Osteopenia   . Hx of radiation therapy 11/22/12- 12/19/12    breast 4250 cGy 17 sessions, left breast boost 750 cGy 3 sessions    Family History  Problem Relation Age of Onset  . Hypertension Mother   . Coronary artery disease Mother   . Hypertension Father   . Cancer Cousin     breast    History   Social History  . Marital Status: Married    Spouse Name: N/A  . Number of Children: N/A  . Years of Education: N/A   Social History Main Topics  . Smoking status: Former Smoker -- 0.25 packs/day for 4 years    Types: Cigarettes  . Smokeless tobacco: Not on file     Comment: Cigarette use was 50 years ago  . Alcohol Use: Yes     Comment: Occ glass of wine with dinner  . Drug Use: No  . Sexual Activity: No     Comment: menarche age 80, 31st birth age 80, G54, HRT x 7 years?   Other Topics  Concern  . None   Social History Narrative    Outpatient Encounter Prescriptions as of 07/30/2014  Medication Sig  . ACCU-CHEK AVIVA PLUS test strip test twice daily  . ACCU-CHEK FASTCLIX LANCETS MISC 1 each by Does not apply route daily.  Marland Kitchen allopurinol (ZYLOPRIM) 300 MG tablet Take 1 tablet (300 mg total) by mouth daily.  Marland Kitchen aspirin 81 MG tablet Take 81 mg by mouth daily.  Marland Kitchen atenolol (TENORMIN) 25 MG tablet take 1/2 tablet by mouth bid   Or as directed  . calcium-vitamin D 250-100 MG-UNIT per tablet Take 1 tablet by mouth 2 (two) times daily.  . Chlorpheniramine Maleate (CHLOR-TABLETS PO) Take by mouth.  . conjugated estrogens (PREMARIN) vaginal cream Apply small amounts twice weekly-  Pt reports only using rarely maybe once a week or once every 2 wks.  Marland Kitchen exemestane (AROMASIN) 25 MG tablet Take 1 tablet (25 mg total) by mouth daily.  . fish oil-omega-3 fatty acids 1000 MG capsule Take 1 g by mouth daily.  Marland Kitchen loratadine (CLARITIN) 10 MG tablet Take 10 mg by mouth daily.  Marland Kitchen  metFORMIN (GLUCOPHAGE) 500 MG tablet Take 1 tablet (500 mg total) by mouth 2 (two) times daily.  . Multiple Vitamins-Minerals (CENTRUM SILVER) tablet Take 1 tablet by mouth daily.    Marland Kitchen OVER THE COUNTER MEDICATION Take by mouth 2 (two) times daily. Presavision- for macular degeneration prevention  . [DISCONTINUED] atenolol (TENORMIN) 25 MG tablet take 1/2 tablet by mouth every morning as directed    EXAM:  BP 144/70 mmHg  Temp(Src) 97.9 F (36.6 C) (Oral)  Wt 142 lb (64.411 kg)  Body mass index is 25.97 kg/(m^2).  GENERAL: vitals reviewed and listed above, alert, oriented, appears well hydrated and in no acute distress HEENT: atraumatic, , no obvious abnormalities on inspection of external nose and ears  NECK: no obvious masses on inspection palpation  LUNGS: clear to auscultation  CV: HRRR, no clubbing cyanosis or  peripheral edema nl cap refill   bp rechecked right arm declined left cause of breast surgery  MS:  moves all extremities without noticeable focal  abnormality PSYCH: pleasant and cooperative, no obvious depression or anxiety says stressed  BP Readings from Last 3 Encounters:  07/30/14 144/70  03/27/14 110/80  03/23/14 140/72   Lab Results  Component Value Date   WBC 6.7 03/27/2014   HGB 15.0 03/27/2014   HCT 45.9 03/27/2014   PLT 192.0 03/27/2014   GLUCOSE 154* 03/27/2014   CHOL 144 03/27/2014   TRIG 139.0 03/27/2014   HDL 53.40 03/27/2014   LDLDIRECT 124.0 07/22/2006   LDLCALC 63 03/27/2014   ALT 24 03/27/2014   AST 29 03/27/2014   NA 145 03/27/2014   K 5.0 03/27/2014   CL 104 03/27/2014   CREATININE 1.1 03/27/2014   BUN 19 03/27/2014   CO2 28 03/27/2014   TSH 2.98 03/27/2014   HGBA1C 7.5* 03/27/2014   MICROALBUR 15.7* 03/27/2014   BP Readings from Last 3 Encounters:  07/30/14 144/70  03/27/14 110/80  03/23/14 140/72     ASSESSMENT AND PLAN:  Discussed the following assessment and plan:  Essential hypertension - inc to 12.5 bid  and fu with dr todd.  Getting 150 to 160 range pulse is stable  Inc to bid dosing  And fu dr Sherren Mocha.  brin g in machine    Expectant management.  Caution for low bp  -Patient advised to return or notify health care team  if symptoms worsen ,persist or new concerns arise.  Patient Instructions  Continue blood pressure readings  Would increase  Atenolol to 1/2 tab twice a day  And plan ROV with Dr Sherren Mocha in 1  Month or so .      [name]. Panosh M.D.

## 2014-07-30 NOTE — Telephone Encounter (Signed)
Noted  

## 2014-07-30 NOTE — Telephone Encounter (Signed)
Patient Name: Adrienne [name] DOB: [birth year] Initial Comment Caller States her bp is high 167/86. does not have as much energy that she normally does. Nurse Assessment Nurse: Vallery Sa, RN, Cathy Date/Time (Eastern Time): 07/30/2014 9:36:50 AM Confirm and document reason for call. If symptomatic, describe symptoms. ---Caller states she noticed that her blood pressure was 167/86 yesterday. She has more tired than usual. No severe breathing difficulty. Has the patient traveled out of the country within the last 30 days? ---No Does the patient require triage? ---Yes Related visit to physician within the last 2 weeks? ---No Does the PT have any chronic conditions? (i.e. diabetes, asthma, etc.) ---Yes List chronic conditions. ---High Blood Pressure, Diabetes, Guidelines Guideline Title Affirmed Question Affirmed Notes High Blood Pressure BP # 160/100 Final Disposition User See Physician within [place], RN, Tye [place] Comments Scheduled for 2pm appointment with Dr. Regis Bill today.

## 2014-07-30 NOTE — Patient Instructions (Signed)
Continue blood pressure readings  Would increase  Atenolol to 1/2 tab twice a day  And plan ROV with Dr Sherren Mocha in 1  Month or so .

## 2014-08-24 LAB — HM MAMMOGRAPHY: HM Mammogram: NORMAL

## 2014-08-27 ENCOUNTER — Encounter: Payer: Self-pay | Admitting: Family Medicine

## 2014-09-04 ENCOUNTER — Encounter: Payer: Self-pay | Admitting: Family Medicine

## 2014-09-04 ENCOUNTER — Ambulatory Visit (INDEPENDENT_AMBULATORY_CARE_PROVIDER_SITE_OTHER): Payer: Medicare Other | Admitting: Family Medicine

## 2014-09-04 VITALS — BP 130/70 | Temp 98.1°F | Wt 141.0 lb

## 2014-09-04 DIAGNOSIS — E139 Other specified diabetes mellitus without complications: Secondary | ICD-10-CM | POA: Insufficient documentation

## 2014-09-04 DIAGNOSIS — I10 Essential (primary) hypertension: Secondary | ICD-10-CM

## 2014-09-04 LAB — BASIC METABOLIC PANEL
BUN: 24 mg/dL — ABNORMAL HIGH (ref 6–23)
CALCIUM: 10.4 mg/dL (ref 8.4–10.5)
CO2: 31 mEq/L (ref 19–32)
CREATININE: 1.15 mg/dL (ref 0.40–1.20)
Chloride: 104 mEq/L (ref 96–112)
GFR: 48.55 mL/min — AB (ref >60.00)
GLUCOSE: 137 mg/dL — AB (ref 70–99)
Potassium: 4.4 mEq/L (ref 3.5–5.1)
SODIUM: 141 meq/L (ref 135–145)

## 2014-09-04 LAB — HEMOGLOBIN A1C: Hgb A1c MFr Bld: 7.2 % — ABNORMAL HIGH (ref 4.6–6.5)

## 2014-09-04 NOTE — Progress Notes (Signed)
   Subjective:    Patient ID: JANAL HAAK, female    DOB: [birth year], 80 y.o.   MRN: [id number]  HPI Brianni is a 80 year old female who has underlying hypertension diabetes who comes in today for follow-up  Her blood pressure was 140/85 on Tenormin 12.5 mg every morning. It was increased to 12.5 mg twice a day. Today her blood pressures 130/70 and she's somewhat lightheaded when she stands up. A normal pressure for her would be 150/90 or less.  Her blood sugar ranges from 138-113. Last A1c was 7.3% 6 months ago. She takes metformin 500 mg before breakfast and before her evening meal.   Review of Systems Review of systems otherwise negative    Objective:   Physical Exam  Well-developed well-nourished female no acute distress vital signs stable she's afebrile BP right arm sitting position 130/70      Assessment & Plan:  Hypertension,,,,,,,,,, BP too low decrease beta blocker back to 12.5 mg daily  Diabetes type 2,,,,,,,,,,,,,,, continue metformin 500 twice a day recheck labs,

## 2014-09-04 NOTE — Patient Instructions (Signed)
Tenormin 25 mg,,,,,,,,,, one half tab daily in the morning,,,,,,,,,, do not take a second dose at bedtime  Metformin 500 mg,,,,,,,,,, continue one before breakfast,,,,,,,, and one before your evening female  Labs today  We will call you the report if we need to make any changes in your medicine,,,,,,,, if not return in November for your annual physical examination  Beaulah Dinning is our new adult nurse practitioner who will be seen you in November  Rachel's extension is [phone]

## 2014-09-04 NOTE — Progress Notes (Signed)
Pre visit review using our clinic review tool, if applicable. No additional management support is needed unless otherwise documented below in the visit note. 

## 2014-09-19 ENCOUNTER — Ambulatory Visit (INDEPENDENT_AMBULATORY_CARE_PROVIDER_SITE_OTHER): Payer: Medicare Other | Admitting: Adult Health

## 2014-09-19 ENCOUNTER — Encounter: Payer: Self-pay | Admitting: Adult Health

## 2014-09-19 VITALS — BP 130/84 | HR 79 | Temp 97.9°F | Ht 62.0 in | Wt 143.4 lb

## 2014-09-19 DIAGNOSIS — H109 Unspecified conjunctivitis: Secondary | ICD-10-CM | POA: Diagnosis not present

## 2014-09-19 DIAGNOSIS — R05 Cough: Secondary | ICD-10-CM

## 2014-09-19 DIAGNOSIS — J069 Acute upper respiratory infection, unspecified: Secondary | ICD-10-CM | POA: Diagnosis not present

## 2014-09-19 DIAGNOSIS — R059 Cough, unspecified: Secondary | ICD-10-CM

## 2014-09-19 MED ORDER — BENZONATATE 200 MG PO CAPS: 200.0000 mg | ORAL_CAPSULE | Freq: Three times a day (TID) | ORAL | Status: DC | PRN

## 2014-09-19 MED ORDER — ERYTHROMYCIN 5 MG/GM OP OINT: 1.0000 "application " | TOPICAL_OINTMENT | Freq: Every day | OPHTHALMIC | Status: DC

## 2014-09-19 NOTE — Patient Instructions (Addendum)
It appears that you have pink eye as well as an upper respiratory infection. I have sent a prescription to the pharmacy for an antibiotic eye eye ointment.. Please apply this ointment to the lower lid 4 times per day for seven days.   For the upper respiratory infection, you can take over the counter zyrtec and Flonase as well as tylenol for the headache. I have sent a prescription to the pharmacy for Tessalon Pearls for your cough, you can take these up to three times a day as needed.   If you are not feeling any better in 2-3 days, please follow up with me. If your symptoms get worse or you have a fever, please let me know. Drink plenty of fluid and get plenty of rest.   Bacterial Conjunctivitis Bacterial conjunctivitis, commonly called pink eye, is an inflammation of the clear membrane that covers the white part of the eye (conjunctiva). The inflammation can also happen on the underside of the eyelids. The blood vessels in the conjunctiva become inflamed, causing the eye to become red or pink. Bacterial conjunctivitis may spread easily from one eye to another and from person to person (contagious).  CAUSES  Bacterial conjunctivitis is caused by bacteria. The bacteria may come from your own skin, your upper respiratory tract, or from someone else with bacterial conjunctivitis. SYMPTOMS  The normally white color of the eye or the underside of the eyelid is usually pink or red. The pink eye is usually associated with irritation, tearing, and some sensitivity to light. Bacterial conjunctivitis is often associated with a thick, yellowish discharge from the eye. The discharge may turn into a crust on the eyelids overnight, which causes your eyelids to stick together. If a discharge is present, there may also be some blurred vision in the affected eye. DIAGNOSIS  Bacterial conjunctivitis is diagnosed by your caregiver through an eye exam and the symptoms that you report. Your caregiver looks for changes in  the surface tissues of your eyes, which may point to the specific type of conjunctivitis. A sample of any discharge may be collected on a cotton-tip swab if you have a severe case of conjunctivitis, if your cornea is affected, or if you keep getting repeat infections that do not respond to treatment. The sample will be sent to a lab to see if the inflammation is caused by a bacterial infection and to see if the infection will respond to antibiotic medicines. TREATMENT   Bacterial conjunctivitis is treated with antibiotics. Antibiotic eyedrops are most often used. However, antibiotic ointments are also available. Antibiotics pills are sometimes used. Artificial tears or eye washes may ease discomfort. HOME CARE INSTRUCTIONS   To ease discomfort, apply a cool, clean washcloth to your eye for 10-20 minutes, 3-4 times a day.  Gently wipe away any drainage from your eye with a warm, wet washcloth or a cotton ball.  Wash your hands often with soap and water. Use paper towels to dry your hands.  Do not share towels or washcloths. This may spread the infection.  Change or wash your pillowcase every day.  You should not use eye makeup until the infection is gone.  Do not operate machinery or drive if your vision is blurred.  Stop using contact lenses. Ask your caregiver how to sterilize or replace your contacts before using them again. This depends on the type of contact lenses that you use.  When applying medicine to the infected eye, do not touch the edge of your eyelid  with the eyedrop bottle or ointment tube. SEEK IMMEDIATE MEDICAL CARE IF:   Your infection has not improved within 3 days after beginning treatment.  You had yellow discharge from your eye and it returns.  You have increased eye pain.  Your eye redness is spreading.  Your vision becomes blurred.  You have a fever or persistent symptoms for more than 2-3 days.  You have a fever and your symptoms suddenly get worse.  You  have facial pain, redness, or swelling. MAKE SURE YOU:   Understand these instructions.  Will watch your condition.  Will get help right away if you are not doing well or get worse. Document Released: 04/20/2005 Document Revised: 09/04/2013 Document Reviewed: 09/21/2011 [facility] Patient Information 2015 [place], [place]. This information is not intended to replace advice given to you by your health care provider. Make sure you discuss any questions you have with your health care provider. Upper Respiratory Infection, Adult An upper respiratory infection (URI) is also sometimes known as the common cold. The upper respiratory tract includes the nose, sinuses, throat, trachea, and bronchi. Bronchi are the airways leading to the lungs. Most people improve within 1 week, but symptoms can last up to 2 weeks. A residual cough may last even longer.  CAUSES Many different viruses can infect the tissues lining the upper respiratory tract. The tissues become irritated and inflamed and often become very moist. Mucus production is also common. A cold is contagious. You can easily spread the virus to others by oral contact. This includes kissing, sharing a glass, coughing, or sneezing. Touching your mouth or nose and then touching a surface, which is then touched by another person, can also spread the virus. SYMPTOMS  Symptoms typically develop 1 to 3 days after you come in contact with a cold virus. Symptoms vary from person to person. They may include:  Runny nose.  Sneezing.  Nasal congestion.  Sinus irritation.  Sore throat.  Loss of voice (laryngitis).  Cough.  Fatigue.  Muscle aches.  Loss of appetite.  Headache.  Low-grade fever. DIAGNOSIS  You might diagnose your own cold based on familiar symptoms, since most people get a cold 2 to 3 times a year. Your caregiver can confirm this based on your exam. Most importantly, your caregiver can check that your symptoms are not due to another  disease such as strep throat, sinusitis, pneumonia, asthma, or epiglottitis. Blood tests, throat tests, and X-rays are not necessary to diagnose a common cold, but they may sometimes be helpful in excluding other more serious diseases. Your caregiver will decide if any further tests are required. RISKS AND COMPLICATIONS  You may be at risk for a more severe case of the common cold if you smoke cigarettes, have chronic heart disease (such as heart failure) or lung disease (such as asthma), or if you have a weakened immune system. The very young and very old are also at risk for more serious infections. Bacterial sinusitis, middle ear infections, and bacterial pneumonia can complicate the common cold. The common cold can worsen asthma and chronic obstructive pulmonary disease (COPD). Sometimes, these complications can require emergency medical care and may be life-threatening. PREVENTION  The best way to protect against getting a cold is to practice good hygiene. Avoid oral or hand contact with people with cold symptoms. Wash your hands often if contact occurs. There is no clear evidence that vitamin C, vitamin E, echinacea, or exercise reduces the chance of developing a cold. However, it is always  recommended to get plenty of rest and practice good nutrition. TREATMENT  Treatment is directed at relieving symptoms. There is no cure. Antibiotics are not effective, because the infection is caused by a virus, not by bacteria. Treatment may include:  Increased fluid intake. Sports drinks offer valuable electrolytes, sugars, and fluids.  Breathing heated mist or steam (vaporizer or shower).  Eating chicken soup or other clear broths, and maintaining good nutrition.  Getting plenty of rest.  Using gargles or lozenges for comfort.  Controlling fevers with ibuprofen or acetaminophen as directed by your caregiver.  Increasing usage of your inhaler if you have asthma. Zinc gel and zinc lozenges, taken in  the first 24 hours of the common cold, can shorten the duration and lessen the severity of symptoms. Pain medicines may help with fever, muscle aches, and throat pain. A variety of non-prescription medicines are available to treat congestion and runny nose. Your caregiver can make recommendations and may suggest nasal or lung inhalers for other symptoms.  HOME CARE INSTRUCTIONS   Only take over-the-counter or prescription medicines for pain, discomfort, or fever as directed by your caregiver.  Use a warm mist humidifier or inhale steam from a shower to increase air moisture. This may keep secretions moist and make it easier to breathe.  Drink enough water and fluids to keep your urine clear or pale yellow.  Rest as needed.  Return to work when your temperature has returned to normal or as your caregiver advises. You may need to stay home longer to avoid infecting others. You can also use a face mask and careful hand washing to prevent spread of the virus. SEEK MEDICAL CARE IF:   After the first few days, you feel you are getting worse rather than better.  You need your caregiver's advice about medicines to control symptoms.  You develop chills, worsening shortness of breath, or brown or red sputum. These may be signs of pneumonia.  You develop yellow or brown nasal discharge or pain in the face, especially when you bend forward. These may be signs of sinusitis.  You develop a fever, swollen neck glands, pain with swallowing, or white areas in the back of your throat. These may be signs of strep throat. SEEK IMMEDIATE MEDICAL CARE IF:   You have a fever.  You develop severe or persistent headache, ear pain, sinus pain, or chest pain.  You develop wheezing, a prolonged cough, cough up blood, or have a change in your usual mucus (if you have chronic lung disease).  You develop sore muscles or a stiff neck. Document Released: 10/14/2000 Document Revised: 07/13/2011 Document Reviewed:  07/26/2013 [facility] Patient Information 2015 [place], [place]. This information is not intended to replace advice given to you by your health care provider. Make sure you discuss any questions you have with your health care provider.

## 2014-09-19 NOTE — Progress Notes (Signed)
Subjective:    Patient ID: Adrienne [name], female    DOB: 03/13/1935, 80 y.o.   MRN: [id number]  HPI  Patient presents to the office for " hacking cough" with slight mucus production. Her cough has been going on since last Thursday while being at the beach. This is associated with frontal headache and ethmoid sinus pressure.    She also endorses that she has been waking up in the morning and her eyes have been "matted shut." She does have itchy bilateral eyes with purulent discharge. She is also concerned that her under eyes are turning purple.     Denies SOB, Fever, or any sick contacts.   Review of Systems  Constitutional: Negative for fever, chills and fatigue.  HENT: Positive for congestion, hearing loss and sinus pressure. Negative for ear discharge, ear pain, sore throat and trouble swallowing.   Eyes: Positive for pain, discharge, redness and itching. Negative for photophobia and visual disturbance.  Neurological: Positive for dizziness, light-headedness and headaches. Negative for weakness.  All other systems reviewed and are negative.  Past Medical History  Diagnosis Date  . Hyperlipidemia   . Hypertension     Does not see a cardiologist  . History of frequent urinary tract infections   . Breast cancer 08/25/12    Invasive ductal ca,DCIS  . Allergy   . Diabetes mellitus     type II  . Hearing loss   . Gout   . Osteopenia   . Hx of radiation therapy 11/22/12- 12/19/12    breast 4250 cGy 17 sessions, left breast boost 750 cGy 3 sessions    History   Social History  . Marital Status: Married    Spouse Name: N/A  . Number of Children: N/A  . Years of Education: N/A   Occupational History  . Not on file.   Social History Main Topics  . Smoking status: Former Smoker -- 0.25 packs/day for 4 years    Types: Cigarettes  . Smokeless tobacco: Not on file     Comment: Cigarette use was 50 years ago  . Alcohol Use: Yes     Comment: Occ glass of wine with dinner    . Drug Use: No  . Sexual Activity: No     Comment: menarche age 42, 89st birth age 40, G90, HRT x 7 years?   Other Topics Concern  . Not on file   Social History Narrative    Past Surgical History  Procedure Laterality Date  . Appendectomy    . Cholecystectomy    . Total abdominal hysterectomy w/ bilateral salpingoophorectomy    . Breast surgery  1990's    right- fibroid cyst  . Septoplasty    . Breast lumpectomy with needle localization and axillary sentinel lymph node bx Left 09/12/2012    Procedure: LEFT NEEDLE LOCALIZATION BREAST LUMPECTOMY TION AND AXILLARY SENTINEL LYMPH NODE BX;  Surgeon: Edward Jolly, MD;  Location: [facility];  Service: General;  Laterality: Left;  . Abdominal hysterectomy Bilateral 1999    w/b/l salpingo-oopherectomy    Family History  Problem Relation Age of Onset  . Hypertension Mother   . Coronary artery disease Mother   . Hypertension Father   . Cancer Cousin     breast    Allergies  Allergen Reactions  . Penicillins Hives and Swelling  . Radiaplexrx [Woun'Dres Hydrogel Wound Dress] Hives    Current Outpatient Prescriptions on File Prior to Visit  Medication Sig Dispense Refill  . ACCU-CHEK  AVIVA PLUS test strip test twice daily 100 each 11  . ACCU-CHEK FASTCLIX LANCETS MISC 1 each by Does not apply route daily. 102 each 3  . allopurinol (ZYLOPRIM) 300 MG tablet Take 1 tablet (300 mg total) by mouth daily. 90 tablet 3  . aspirin 81 MG tablet Take 81 mg by mouth daily.    Marland Kitchen atenolol (TENORMIN) 25 MG tablet take 1/2 tablet by mouth bid   Or as directed 30 tablet 1  . calcium-vitamin D 250-100 MG-UNIT per tablet Take 1 tablet by mouth 2 (two) times daily.    . Chlorpheniramine Maleate (CHLOR-TABLETS PO) Take by mouth.    . conjugated estrogens (PREMARIN) vaginal cream Apply small amounts twice weekly-  Pt reports only using rarely maybe once a week or once every 2 wks. 42.5 g 6  . exemestane (AROMASIN) 25 MG tablet Take 1 tablet (25 mg  total) by mouth daily. 90 tablet 3  . fish oil-omega-3 fatty acids 1000 MG capsule Take 1 g by mouth daily.    Marland Kitchen loratadine (CLARITIN) 10 MG tablet Take 10 mg by mouth daily.    . metFORMIN (GLUCOPHAGE) 500 MG tablet Take 1 tablet (500 mg total) by mouth 2 (two) times daily. 180 tablet 3  . Multiple Vitamins-Minerals (CENTRUM SILVER) tablet Take 1 tablet by mouth daily.      Marland Kitchen OVER THE COUNTER MEDICATION Take by mouth 2 (two) times daily. Presavision- for macular degeneration prevention     No current facility-administered medications on file prior to visit.    BP 130/84 mmHg  Pulse 79  Temp(Src) 97.9 F (36.6 C) (Oral)  Ht 5\' 2"  (1.575 m)  Wt 143 lb 6.4 oz (65.046 kg)  BMI 26.22 kg/m2  SpO2 94%       Objective:   Physical Exam  Constitutional: She is oriented to person, place, and time. She appears well-developed.  HENT:  Head: Normocephalic and atraumatic.  Right Ear: External ear normal.  Left Ear: External ear normal.  Mouth/Throat: No oropharyngeal exudate.  Wears hearing aids. Bilateral inner ears are clean and free of cerumen impaction. Tympanic membrane visualized and WNL.   Eyes: Right eye exhibits discharge. Left eye exhibits discharge.  Purulent discharge with matting of eye lids. Conjunctiva red and injected.   Cardiovascular: Normal rate, regular rhythm, normal heart sounds and intact distal pulses.  Exam reveals no gallop and no friction rub.   No murmur heard. Pulmonary/Chest: Effort normal. No respiratory distress. She has no wheezes. She has no rales. She exhibits no tenderness.  Musculoskeletal: Normal range of motion.  Lymphadenopathy:    She has no cervical adenopathy.  Neurological: She is alert and oriented to person, place, and time.  Skin: Skin is warm. No rash noted. No erythema. No pallor.  Psychiatric: She has a normal mood and affect. Judgment and thought content normal.  Nursing note and vitals reviewed.      Assessment & Plan:  1. Acute  upper respiratory infection - benzonatate (TESSALON) 200 MG capsule; Take 1 capsule (200 mg total) by mouth 3 (three) times daily as needed for cough.  Dispense: 20 capsule; Refill: 0 - Take OTC Claritin and Flonase - Follow up if no improvement in 2-3 days.  - Follow up sooner if symptoms worsen.   2. Bilateral conjunctivitis - erythromycin (ROMYCIN) ophthalmic ointment; Place 1 application into both eyes at bedtime.  Dispense: 3.5 g; Refill: 0 - Educated on how to apply eye ointment  - Advised to get new  contact lens for fear of recontamination  - Follow up if no improvement in 2-3 days.   3. Cough - benzonatate (TESSALON) 200 MG capsule; Take 1 capsule (200 mg total) by mouth 3 (three) times daily as needed for cough.  Dispense: 20 capsule; Refill: 0

## 2014-09-19 NOTE — Progress Notes (Signed)
Pre visit review using our clinic review tool, if applicable. No additional management support is needed unless otherwise documented below in the visit note. 

## 2014-09-24 ENCOUNTER — Other Ambulatory Visit: Payer: Self-pay | Admitting: *Deleted

## 2014-09-24 DIAGNOSIS — C50912 Malignant neoplasm of unspecified site of left female breast: Secondary | ICD-10-CM

## 2014-09-25 ENCOUNTER — Other Ambulatory Visit (HOSPITAL_BASED_OUTPATIENT_CLINIC_OR_DEPARTMENT_OTHER): Payer: Medicare Other

## 2014-09-25 ENCOUNTER — Telehealth: Payer: Self-pay | Admitting: Hematology

## 2014-09-25 ENCOUNTER — Encounter: Payer: Self-pay | Admitting: Hematology

## 2014-09-25 ENCOUNTER — Ambulatory Visit (HOSPITAL_BASED_OUTPATIENT_CLINIC_OR_DEPARTMENT_OTHER): Payer: Medicare Other | Admitting: Hematology

## 2014-09-25 DIAGNOSIS — M8580 Other specified disorders of bone density and structure, unspecified site: Secondary | ICD-10-CM

## 2014-09-25 DIAGNOSIS — C50812 Malignant neoplasm of overlapping sites of left female breast: Secondary | ICD-10-CM | POA: Diagnosis not present

## 2014-09-25 DIAGNOSIS — C50912 Malignant neoplasm of unspecified site of left female breast: Secondary | ICD-10-CM

## 2014-09-25 LAB — COMPREHENSIVE METABOLIC PANEL (CC13)
ALBUMIN: 3.5 g/dL (ref 3.5–5.0)
ALT: 22 U/L (ref 0–55)
AST: 19 U/L (ref 5–34)
Alkaline Phosphatase: 80 U/L (ref 40–150)
Anion Gap: 13 mEq/L — ABNORMAL HIGH (ref 3–11)
BILIRUBIN TOTAL: 0.44 mg/dL (ref 0.20–1.20)
BUN: 20.5 mg/dL (ref 7.0–26.0)
CO2: 28 meq/L (ref 22–29)
Calcium: 10.7 mg/dL — ABNORMAL HIGH (ref 8.4–10.4)
Chloride: 101 mEq/L (ref 98–109)
Creatinine: 1.2 mg/dL — ABNORMAL HIGH (ref 0.6–1.1)
EGFR: 42 mL/min/{1.73_m2} — ABNORMAL LOW (ref >90)
GLUCOSE: 127 mg/dL (ref 70–140)
Potassium: 3.9 mEq/L (ref 3.5–5.1)
SODIUM: 141 meq/L (ref 136–145)
TOTAL PROTEIN: 7.3 g/dL (ref 6.4–8.3)

## 2014-09-25 LAB — CBC WITH DIFFERENTIAL/PLATELET
BASO%: 0.8 % (ref 0.0–2.0)
BASOS ABS: 0.1 10*3/uL (ref 0.0–0.1)
EOS%: 1.3 % (ref 0.0–7.0)
Eosinophils Absolute: 0.1 10*3/uL (ref 0.0–0.5)
HCT: 44 % (ref 34.8–46.6)
HEMOGLOBIN: 14.5 g/dL (ref 11.6–15.9)
LYMPH%: 15.6 % (ref 14.0–49.7)
MCH: 30.8 pg (ref 25.1–34.0)
MCHC: 32.8 g/dL (ref 31.5–36.0)
MCV: 93.6 fL (ref 79.5–101.0)
MONO#: 0.7 10*3/uL (ref 0.1–0.9)
MONO%: 6.7 % (ref 0.0–14.0)
NEUT#: 7.4 10*3/uL — ABNORMAL HIGH (ref 1.5–6.5)
NEUT%: 75.6 % (ref 38.4–76.8)
PLATELETS: 289 10*3/uL (ref 145–400)
RBC: 4.7 10*6/uL (ref 3.70–5.45)
RDW: 13.7 % (ref 11.2–14.5)
WBC: 9.8 10*3/uL (ref 3.9–10.3)
lymph#: 1.5 10*3/uL (ref 0.9–3.3)

## 2014-09-25 NOTE — Progress Notes (Signed)
[facility] Follow-up office visit  ID: Adrienne [name] OB: [birth year]  MR#: [id number]  UKG#:[id number]  PCP: Joycelyn Man, MD GYN:   SU:  OTHER MD:  CHIEF COMPLAINT: breast cancer f/u  Oncology History   Breast cancer, left breast   Staging form: Breast, AJCC 7th Edition     Clinical stage from 09/12/2012: Stage IA (T1b, N0, M0) - Unsigned Breast cancer   Staging form: Breast, AJCC 7th Edition     Clinical stage from 09/29/2012: Stage IA (T1, N0, cM0) - Unsigned     Pathologic: No stage assigned - Unsigned       Breast cancer, left breast   08/25/2012 Receptors her2 ER 100% positive, PR 88% positive, HER-2 negative, Ki-67 16%   08/30/2012 Initial Diagnosis Breast cancer, left breast   09/12/2012 Pathology Results T1bN0 Grade 1 invasive ductal carcinoma, and DCIS.   09/12/2012 Surgery Left breast lumpectomy and sentinel lymph node biopsy, negative margins.   11/09/2012 - 12/13/2012 Radiation Therapy Adjuvant breast radiation   12/2012 -  Anti-estrogen oral therapy Anastrozole 1 mg daily, switched to Aromasin 25 mg daily in generally 2015 due to diarrhea.     CURRENT THERAPY: Aromasin daily  INTERVAL HISTORY: Adrienne [name] is doing well today.  She is here for follow up of her invasive ductal carcinoma.  She is taking Aromasin daily and tolerates it very well. She developed URI 4-5 days ago, with dry cough, no fever or dyspnea. She was seen by her PCP last week and takes cough syrup.  No other new symptoms.   REVIEW OF SYSTEMS: A 10 point review of systems was conducted and is otherwise negative except for what is noted above.     PAST MEDICAL HISTORY: Past Medical History  Diagnosis Date  . Hyperlipidemia   . Hypertension     Does not see a cardiologist  . History of frequent urinary tract infections   . Breast cancer 08/25/12    Invasive ductal ca,DCIS  . Allergy   . Diabetes mellitus     type II  . Hearing loss   . Gout   . Osteopenia   . Hx of radiation  therapy 11/22/12- 12/19/12    breast 4250 cGy 17 sessions, left breast boost 750 cGy 3 sessions    PAST SURGICAL HISTORY: Past Surgical History  Procedure Laterality Date  . Appendectomy    . Cholecystectomy    . Total abdominal hysterectomy w/ bilateral salpingoophorectomy    . Breast surgery  1990's    right- fibroid cyst  . Septoplasty    . Breast lumpectomy with needle localization and axillary sentinel lymph node bx Left 09/12/2012    Procedure: LEFT NEEDLE LOCALIZATION BREAST LUMPECTOMY TION AND AXILLARY SENTINEL LYMPH NODE BX;  Surgeon: Edward Jolly, MD;  Location: [facility];  Service: General;  Laterality: Left;  . Abdominal hysterectomy Bilateral 1999    w/b/l salpingo-oopherectomy    FAMILY HISTORY Family History  Problem Relation Age of Onset  . Hypertension Mother   . Coronary artery disease Mother   . Hypertension Father   . Cancer Cousin     breast    GYNECOLOGIC HISTORY: menarche at age 63, g9 p3, s/p TAH/BSO in early 75s.  Was on HRT for several years (cannot recall the duration).  No h/o abnormal pap smears or sexually transmitted infections.    SOCIAL HISTORY: Lives in [place], [place] in a three story house.  Her husband lives at a nursing home due to dementia.  She is a retired Pharmacist, hospital.  Her son and daughter live in [place].      ADVANCED DIRECTIVES: Not in place.    HEALTH MAINTENANCE: History  Substance Use Topics  . Smoking status: Former Smoker -- 0.25 packs/day for 4 years    Types: Cigarettes  . Smokeless tobacco: Not on file     Comment: Cigarette use was 50 years ago  . Alcohol Use: Yes     Comment: Occ glass of wine with dinner      Mammogram: 08/24/2014 (negative)  Colonoscopy: 2011 Bone Density Scan: 01/13/13, osteopenia T score -1.8  Pap Smear: s/p tah/bso Eye Exam: due Vitamin D Level:  Normal, 04/14/13 Lipid Panel: unsure   Allergies  Allergen Reactions  . Penicillins Hives and Swelling  . Radiaplexrx [Woun'Dres Hydrogel  Wound Dress] Hives    Current Outpatient Prescriptions  Medication Sig Dispense Refill  . ACCU-CHEK AVIVA PLUS test strip test twice daily 100 each 11  . ACCU-CHEK FASTCLIX LANCETS MISC 1 each by Does not apply route daily. 102 each 3  . allopurinol (ZYLOPRIM) 300 MG tablet Take 1 tablet (300 mg total) by mouth daily. 90 tablet 3  . aspirin 81 MG tablet Take 81 mg by mouth daily.    Marland Kitchen atenolol (TENORMIN) 25 MG tablet take 1/2 tablet by mouth bid   Or as directed 30 tablet 1  . benzonatate (TESSALON) 200 MG capsule Take 1 capsule (200 mg total) by mouth 3 (three) times daily as needed for cough. 20 capsule 0  . calcium-vitamin D 250-100 MG-UNIT per tablet Take 1 tablet by mouth 2 (two) times daily.    . Chlorpheniramine Maleate (CHLOR-TABLETS PO) Take by mouth.    . conjugated estrogens (PREMARIN) vaginal cream Apply small amounts twice weekly-  Pt reports only using rarely maybe once a week or once every 2 wks. 42.5 g 6  . erythromycin (ROMYCIN) ophthalmic ointment Place 1 application into both eyes at bedtime. 3.5 g 0  . exemestane (AROMASIN) 25 MG tablet Take 1 tablet (25 mg total) by mouth daily. 90 tablet 3  . fish oil-omega-3 fatty acids 1000 MG capsule Take 1 g by mouth daily.    Marland Kitchen loratadine (CLARITIN) 10 MG tablet Take 10 mg by mouth daily.    . metFORMIN (GLUCOPHAGE) 500 MG tablet Take 1 tablet (500 mg total) by mouth 2 (two) times daily. 180 tablet 3  . Multiple Vitamins-Minerals (CENTRUM SILVER) tablet Take 1 tablet by mouth daily.      Marland Kitchen OVER THE COUNTER MEDICATION Take by mouth 2 (two) times daily. Presavision- for macular degeneration prevention     No current facility-administered medications for this visit.    OBJECTIVE: Filed Vitals:   09/25/14 0958  BP: 156/81  Pulse: 79  Temp: 97.5 F (36.4 C)  Resp: 18     Body mass index is 25.36 kg/(m^2).     GENERAL: Patient is a well appearing female in no acute distress HEENT:  Sclerae anicteric.  Oropharynx clear and  moist. No ulcerations or evidence of oropharyngeal candidiasis. Neck is supple.  NODES:  No cervical, supraclavicular, or axillary lymphadenopathy palpated.  BREAST EXAM: left breast s/p lumpectomy, no nodularity, no masses, right breast no masses or nodules, benign bilateral breast exam. LUNGS:  Clear to auscultation bilaterally.  No wheezes or rhonchi. HEART:  Regular rate and rhythm. No murmur appreciated. ABDOMEN:  Soft, nontender.  Positive, normoactive bowel sounds. No organomegaly palpated. MSK:  No focal spinal tenderness to palpation. Full  range of motion bilaterally in the upper extremities. EXTREMITIES:  No peripheral edema.   SKIN:  Clear with no obvious rashes or skin changes. No nail dyscrasia. NEURO:  Nonfocal. Well oriented.  Appropriate affect. ECOG FS:1 - Symptomatic but completely ambulatory  LAB RESULTS: CBC Latest Ref Rng 09/25/2014 03/27/2014 03/23/2014  WBC 3.9 - 10.3 10e3/uL 9.8 6.7 6.5  Hemoglobin 11.6 - 15.9 g/dL 14.5 15.0 14.0  Hematocrit 34.8 - 46.6 % 44.0 45.9 42.4  Platelets 145 - 400 10e3/uL 289 192.0 185    CMP Latest Ref Rng 09/25/2014 09/04/2014 03/27/2014  Glucose 70 - 140 mg/dl 127 137(H) 154(H)  BUN 7.0 - 26.0 mg/dL 20.5 24(H) 19  Creatinine 0.6 - 1.1 mg/dL 1.2(H) 1.15 1.1  Sodium 136 - 145 mEq/L 141 141 145  Potassium 3.5 - 5.1 mEq/L 3.9 4.4 5.0  Chloride 96 - 112 mEq/L - 104 104  CO2 22 - 29 mEq/L $Remove'28 31 28  'xpGLvwv$ Calcium 8.4 - 10.4 mg/dL 10.7(H) 10.4 10.0  Total Protein 6.4 - 8.3 g/dL 7.3 - 7.4  Total Bilirubin 0.20 - 1.20 mg/dL 0.44 - 0.7  Alkaline Phos 40 - 150 U/L 80 - 87  AST 5 - 34 U/L 19 - 29  ALT 0 - 55 U/L 22 - 24     STUDIES:   ASSESSMENT: 80 y.o[place], [place]   1. pT1bN0, stage IA invasive ductal carcinoma of the left breast, grade I, ER 100%, PR 88%, Ki-67 16%, HER-2/neu negative. -she went on to have a lumpectomy with sentinel lymph node biopsy on 09/12/2012, s/p radiation therapy from 11/09/2012 through 12/19/2012.  -she is  adjuvant Aromasin, tolerating well, we'll continue, for total 5 years -She'll continue surveillance with annual mammogram, follow-up with an exam with [name] every 6 months -We discussed healthy diet and regular exercise.  2. Osteopenia -She is on vitamin D and calcium 2 tablets a day. -Giving her worsening hypercalcemia, I told her to reduce calcium to 1 tablet a day -Repeat the bone density scan every 2 years, she is due before next visit  3. Hypercalcemia -Her calcium was 10 6 months ago, 10.7 today, albumin 3.5. Asymptomatic -I total her to decrease calcium to 1 tab daily -She knows to drink adequately and avoid dehydration  -She'll follow up with primary care physician for further workup.   Plan -Continue Aromasin -I'll see her back in 6 months with lab and a bone density scan -Please follow-up with her primary care physician for hypercalcemia  I spent 25 minutes counseling the patient face to face.  The total time spent in the appointment was 30 minutes.  Truitt Merle   09/25/2014 10:20 AM

## 2014-09-25 NOTE — Telephone Encounter (Signed)
per pof ot [facility] pt appt-cld to sh pt DEXA-gave -pt copy of sch and gave pt time/date/location of DEXA

## 2014-10-17 ENCOUNTER — Other Ambulatory Visit: Payer: Self-pay

## 2014-10-17 MED ORDER — GLUCOSE BLOOD VI STRP: ORAL_STRIP | Status: DC

## 2014-10-25 ENCOUNTER — Other Ambulatory Visit: Payer: Self-pay | Admitting: Internal Medicine

## 2014-10-30 ENCOUNTER — Encounter: Payer: Self-pay | Admitting: Adult Health

## 2014-10-30 ENCOUNTER — Ambulatory Visit (INDEPENDENT_AMBULATORY_CARE_PROVIDER_SITE_OTHER): Payer: Medicare Other | Admitting: Adult Health

## 2014-10-30 VITALS — BP 130/80 | Temp 98.0°F | Ht 62.0 in | Wt 142.0 lb

## 2014-10-30 DIAGNOSIS — R300 Dysuria: Secondary | ICD-10-CM | POA: Diagnosis not present

## 2014-10-30 LAB — POCT URINALYSIS DIPSTICK
Bilirubin, UA: NEGATIVE
Glucose, UA: NEGATIVE
Ketones, UA: NEGATIVE
NITRITE UA: NEGATIVE
PH UA: 5
RBC UA: NEGATIVE
Spec Grav, UA: 1.015
Urobilinogen, UA: 0.2

## 2014-10-30 MED ORDER — CIPROFLOXACIN HCL 250 MG PO TABS: 250.0000 mg | ORAL_TABLET | Freq: Two times a day (BID) | ORAL | Status: DC

## 2014-10-30 NOTE — Progress Notes (Signed)
Pre visit review using our clinic review tool, if applicable. No additional management support is needed unless otherwise documented below in the visit note. 

## 2014-10-30 NOTE — Progress Notes (Signed)
Subjective:    Patient ID: Adrienne [name], female    DOB: 1934/05/29, 80 y.o.   MRN: [id number]  HPI  80 year old female who presents to the office for UTI type symptoms. Frequency, urgency,  and burning since Saturday. She has taken Azo which has helped.   Denies hematuria. She endorses that the last two times she has had a UTI that she has been hospitalized for at least a week. The last UTI was 3 years ago.   She also has three bug bites, two on her left thigh and one on her right knee. She only complains of itching. Has been putting hydrocortisone cream and hydrogen peroxide on it.     Review of Systems  Respiratory: Negative.   Cardiovascular: Negative.   Gastrointestinal: Negative.   Genitourinary: Positive for dysuria, urgency and frequency. Negative for hematuria, flank pain, decreased urine volume and vaginal discharge.  Musculoskeletal: Negative.   Skin: Positive for wound.  Neurological: Negative.   Hematological: Negative.   Psychiatric/Behavioral: Negative.   All other systems reviewed and are negative.  Past Medical History  Diagnosis Date  . Hyperlipidemia   . Hypertension     Does not see a cardiologist  . History of frequent urinary tract infections   . Breast cancer 08/25/12    Invasive ductal ca,DCIS  . Allergy   . Diabetes mellitus     type II  . Hearing loss   . Gout   . Osteopenia   . Hx of radiation therapy 11/22/12- 12/19/12    breast 4250 cGy 17 sessions, left breast boost 750 cGy 3 sessions    History   Social History  . Marital Status: Married    Spouse Name: N/A  . Number of Children: N/A  . Years of Education: N/A   Occupational History  . Not on file.   Social History Main Topics  . Smoking status: Former Smoker -- 0.25 packs/day for 4 years    Types: Cigarettes  . Smokeless tobacco: Not on file     Comment: Cigarette use was 50 years ago  . Alcohol Use: Yes     Comment: Occ glass of wine with dinner  . Drug Use: No  . Sexual  Activity: No     Comment: menarche age 44, 72st birth age 80, G80, HRT x 7 years?   Other Topics Concern  . Not on file   Social History Narrative    Past Surgical History  Procedure Laterality Date  . Appendectomy    . Cholecystectomy    . Total abdominal hysterectomy w/ bilateral salpingoophorectomy    . Breast surgery  1990's    right- fibroid cyst  . Septoplasty    . Breast lumpectomy with needle localization and axillary sentinel lymph node bx Left 09/12/2012    Procedure: LEFT NEEDLE LOCALIZATION BREAST LUMPECTOMY TION AND AXILLARY SENTINEL LYMPH NODE BX;  Surgeon: Edward Jolly, MD;  Location: [facility];  Service: General;  Laterality: Left;  . Abdominal hysterectomy Bilateral 1999    w/b/l salpingo-oopherectomy    Family History  Problem Relation Age of Onset  . Hypertension Mother   . Coronary artery disease Mother   . Hypertension Father   . Cancer Cousin     breast    Allergies  Allergen Reactions  . Penicillins Hives and Swelling  . Radiaplexrx [Woun'Dres Hydrogel Wound Dress] Hives    Current Outpatient Prescriptions on File Prior to Visit  Medication Sig Dispense Refill  .  ACCU-CHEK FASTCLIX LANCETS MISC 1 each by Does not apply route daily. 102 each 3  . allopurinol (ZYLOPRIM) 300 MG tablet Take 1 tablet (300 mg total) by mouth daily. 90 tablet 3  . aspirin 81 MG tablet Take 81 mg by mouth daily.    Marland Kitchen atenolol (TENORMIN) 25 MG tablet TAKE ONE-HALF TABLET BY MOUTH TWICE DAILY 30 tablet 1  . benzonatate (TESSALON) 200 MG capsule Take 1 capsule (200 mg total) by mouth 3 (three) times daily as needed for cough. 20 capsule 0  . calcium-vitamin D 250-100 MG-UNIT per tablet Take 1 tablet by mouth 2 (two) times daily.    . Chlorpheniramine Maleate (CHLOR-TABLETS PO) Take by mouth.    . conjugated estrogens (PREMARIN) vaginal cream Apply small amounts twice weekly-  Pt reports only using rarely maybe once a week or once every 2 wks. 42.5 g 6  . erythromycin  (ROMYCIN) ophthalmic ointment Place 1 application into both eyes at bedtime. 3.5 g 0  . exemestane (AROMASIN) 25 MG tablet Take 1 tablet (25 mg total) by mouth daily. 90 tablet 3  . fish oil-omega-3 fatty acids 1000 MG capsule Take 1 g by mouth daily.    Marland Kitchen glucose blood (ACCU-CHEK AVIVA PLUS) test strip test twice daily 100 each 11  . loratadine (CLARITIN) 10 MG tablet Take 10 mg by mouth daily.    . metFORMIN (GLUCOPHAGE) 500 MG tablet Take 1 tablet (500 mg total) by mouth 2 (two) times daily. 180 tablet 3  . Multiple Vitamins-Minerals (CENTRUM SILVER) tablet Take 1 tablet by mouth daily.      Marland Kitchen OVER THE COUNTER MEDICATION Take by mouth 2 (two) times daily. Presavision- for macular degeneration prevention     No current facility-administered medications on file prior to visit.    BP 130/80 mmHg  Temp(Src) 98 F (36.7 C) (Oral)  Ht 5\' 2"  (1.575 m)  Wt 142 lb (64.411 kg)  BMI 25.97 kg/m2       Objective:   Physical Exam  Constitutional: She is oriented to person, place, and time.  Cardiovascular: Normal rate, regular rhythm, normal heart sounds and intact distal pulses.  Exam reveals no gallop and no friction rub.   No murmur heard. Pulmonary/Chest: Effort normal and breath sounds normal. No respiratory distress. She has no wheezes. She has no rales. She exhibits no tenderness.  Abdominal: Soft. Bowel sounds are normal. She exhibits no distension and no mass. There is no tenderness. There is no rebound and no guarding.  Genitourinary:  No CVA tenderness  Neurological: She is alert and oriented to person, place, and time.  Skin: Skin is warm and dry. No rash noted. No erythema. No pallor.  Psychiatric: She has a normal mood and affect. Her behavior is normal. Judgment and thought content normal.  Nursing note and vitals reviewed.      Assessment & Plan:  1. Dysuria - POCT urinalysis dipstick; Standing - POCT urinalysis dipstick - ciprofloxacin (CIPRO) 250 MG tablet; Take 1  tablet (250 mg total) by mouth 2 (two) times daily.  Dispense: 10 tablet; Refill: 0 - Follow up if no improvement in 2-3 days.

## 2014-10-30 NOTE — Patient Instructions (Addendum)
Please take the cipro twice a day for five days. I will let you know if we need to change the medicine once the urine culture is back. Continue to take the Azo. Follow up with me if no improvement by Thursday.   Dysuria Dysuria is the medical term for pain with urination. There are many causes for dysuria, but urinary tract infection is the most common. If a urinalysis was performed it can show that there is a urinary tract infection. A urine culture confirms that you or your child is sick. You will need to follow up with a healthcare provider because:  If a urine culture was done you will need to know the culture results and treatment recommendations.  If the urine culture was positive, you or your child will need to be put on antibiotics or know if the antibiotics prescribed are the right antibiotics for your urinary tract infection.  If the urine culture is negative (no urinary tract infection), then other causes may need to be explored or antibiotics need to be stopped. Today laboratory work may have been done and there does not seem to be an infection. If cultures were done they will take at least 24 to 48 hours to be completed. Today x-rays may have been taken and they read as normal. No cause can be found for the problems. The x-rays may be re-read by a radiologist and you will be contacted if additional findings are made. You or your child may have been put on medications to help with this problem until you can see your primary caregiver. If the problems get better, see your primary caregiver if the problems return. If you were given antibiotics (medications which kill germs), take all of the mediations as directed for the full course of treatment.  If laboratory work was done, you need to find the results. Leave a telephone number where you can be reached. If this is not possible, make sure you find out how you are to get test results. HOME CARE INSTRUCTIONS   Drink lots of fluids. For  adults, drink eight, 8 ounce glasses of clear juice or water a day. For children, replace fluids as suggested by your caregiver.  Empty the bladder often. Avoid holding urine for long periods of time.  After a bowel movement, women should cleanse front to back, using each tissue only once.  Empty your bladder before and after sexual intercourse.  Take all the medicine given to you until it is gone. You may feel better in a few days, but TAKE ALL MEDICINE.  Avoid caffeine, tea, alcohol and carbonated beverages, because they tend to irritate the bladder.  In men, alcohol may irritate the prostate.  Only take over-the-counter or prescription medicines for pain, discomfort, or fever as directed by your caregiver.  If your caregiver has given you a follow-up appointment, it is very important to keep that appointment. Not keeping the appointment could result in a chronic or permanent injury, pain, and disability. If there is any problem keeping the appointment, you must call back to this facility for assistance. SEEK IMMEDIATE MEDICAL CARE IF:   Back pain develops.  A fever develops.  There is nausea (feeling sick to your stomach) or vomiting (throwing up).  Problems are no better with medications or are getting worse. MAKE SURE YOU:   Understand these instructions.  Will watch your condition.  Will get help right away if you are not doing well or get worse. Document Released: 01/17/2004 Document  Revised: 07/13/2011 Document Reviewed: 11/24/2007 [facility] Patient Information 2015 [place], [place]. This information is not intended to replace advice given to you by your health care provider. Make sure you discuss any questions you have with your health care provider.

## 2015-01-25 ENCOUNTER — Ambulatory Visit (INDEPENDENT_AMBULATORY_CARE_PROVIDER_SITE_OTHER): Payer: Medicare Other | Admitting: *Deleted

## 2015-01-25 DIAGNOSIS — Z23 Encounter for immunization: Secondary | ICD-10-CM | POA: Diagnosis not present

## 2015-03-01 ENCOUNTER — Other Ambulatory Visit: Payer: Self-pay | Admitting: Family Medicine

## 2015-03-24 ENCOUNTER — Other Ambulatory Visit: Payer: Self-pay | Admitting: Family Medicine

## 2015-03-25 ENCOUNTER — Other Ambulatory Visit (INDEPENDENT_AMBULATORY_CARE_PROVIDER_SITE_OTHER): Payer: Medicare Other

## 2015-03-25 DIAGNOSIS — I10 Essential (primary) hypertension: Secondary | ICD-10-CM

## 2015-03-25 DIAGNOSIS — Z0000 Encounter for general adult medical examination without abnormal findings: Secondary | ICD-10-CM

## 2015-03-25 DIAGNOSIS — E785 Hyperlipidemia, unspecified: Secondary | ICD-10-CM | POA: Diagnosis not present

## 2015-03-25 LAB — POCT URINALYSIS DIPSTICK
BILIRUBIN UA: NEGATIVE
Blood, UA: NEGATIVE
Glucose, UA: NEGATIVE
Ketones, UA: NEGATIVE
NITRITE UA: NEGATIVE
PH UA: 6.5
Spec Grav, UA: 1.015
Urobilinogen, UA: 0.2

## 2015-03-25 LAB — MICROALBUMIN / CREATININE URINE RATIO
CREATININE, U: 60.5 mg/dL
MICROALB UR: 12.6 mg/dL — AB (ref 0.0–1.9)
MICROALB/CREAT RATIO: 20.8 mg/g (ref 0.0–30.0)

## 2015-03-25 LAB — HEPATIC FUNCTION PANEL
ALBUMIN: 4.4 g/dL (ref 3.5–5.2)
ALK PHOS: 98 U/L (ref 39–117)
ALT: 17 U/L (ref 0–35)
AST: 18 U/L (ref 0–37)
Bilirubin, Direct: 0.1 mg/dL (ref 0.0–0.3)
TOTAL PROTEIN: 7.1 g/dL (ref 6.0–8.3)
Total Bilirubin: 0.9 mg/dL (ref 0.2–1.2)

## 2015-03-25 LAB — LIPID PANEL
CHOLESTEROL: 223 mg/dL — AB (ref 0–200)
HDL: 45.6 mg/dL (ref >39.00)
LDL Cholesterol: 148 mg/dL — ABNORMAL HIGH (ref 0–99)
NONHDL: 177.44
Total CHOL/HDL Ratio: 5
Triglycerides: 148 mg/dL (ref 0.0–149.0)
VLDL: 29.6 mg/dL (ref 0.0–40.0)

## 2015-03-25 LAB — BASIC METABOLIC PANEL
BUN: 19 mg/dL (ref 6–23)
CALCIUM: 10 mg/dL (ref 8.4–10.5)
CO2: 27 mEq/L (ref 19–32)
CREATININE: 1.09 mg/dL (ref 0.40–1.20)
Chloride: 104 mEq/L (ref 96–112)
GFR: 51.57 mL/min — AB (ref >60.00)
Glucose, Bld: 153 mg/dL — ABNORMAL HIGH (ref 70–99)
Potassium: 4.5 mEq/L (ref 3.5–5.1)
SODIUM: 142 meq/L (ref 135–145)

## 2015-03-25 LAB — HEMOGLOBIN A1C: Hgb A1c MFr Bld: 7.3 % — ABNORMAL HIGH (ref 4.6–6.5)

## 2015-03-25 LAB — TSH: TSH: 3.67 u[IU]/mL (ref 0.35–4.50)

## 2015-03-26 ENCOUNTER — Encounter: Payer: Self-pay | Admitting: Hematology

## 2015-03-26 ENCOUNTER — Ambulatory Visit (HOSPITAL_BASED_OUTPATIENT_CLINIC_OR_DEPARTMENT_OTHER): Payer: Medicare Other | Admitting: Hematology

## 2015-03-26 ENCOUNTER — Other Ambulatory Visit: Payer: Self-pay | Admitting: Hematology

## 2015-03-26 ENCOUNTER — Ambulatory Visit (HOSPITAL_BASED_OUTPATIENT_CLINIC_OR_DEPARTMENT_OTHER): Payer: Medicare Other

## 2015-03-26 VITALS — BP 151/94 | HR 110 | Temp 97.4°F | Resp 18 | Ht 62.0 in | Wt 143.4 lb

## 2015-03-26 DIAGNOSIS — M8580 Other specified disorders of bone density and structure, unspecified site: Secondary | ICD-10-CM | POA: Diagnosis not present

## 2015-03-26 DIAGNOSIS — C50512 Malignant neoplasm of lower-outer quadrant of left female breast: Secondary | ICD-10-CM

## 2015-03-26 DIAGNOSIS — Z79811 Long term (current) use of aromatase inhibitors: Secondary | ICD-10-CM

## 2015-03-26 DIAGNOSIS — C50912 Malignant neoplasm of unspecified site of left female breast: Secondary | ICD-10-CM

## 2015-03-26 DIAGNOSIS — Z170 Estrogen receptor positive status [ER+]: Secondary | ICD-10-CM | POA: Diagnosis not present

## 2015-03-26 LAB — CBC WITH DIFFERENTIAL/PLATELET
BASO%: 0.8 % (ref 0.0–2.0)
BASOS ABS: 0 10*3/uL (ref 0.0–0.1)
BASOS ABS: 0.1 10*3/uL (ref 0.0–0.1)
Basophils Relative: 0.3 % (ref 0.0–3.0)
EOS ABS: 0.2 10*3/uL (ref 0.0–0.7)
EOS%: 2.1 % (ref 0.0–7.0)
Eosinophils Absolute: 0.1 10*3/uL (ref 0.0–0.5)
Eosinophils Relative: 2.9 % (ref 0.0–5.0)
HEMATOCRIT: 47.1 % — AB (ref 36.0–46.0)
HEMATOCRIT: 47.5 % — AB (ref 34.8–46.6)
HEMOGLOBIN: 15.4 g/dL — AB (ref 12.0–15.0)
HGB: 15.3 g/dL (ref 11.6–15.9)
LYMPH%: 24.1 % (ref 14.0–49.7)
LYMPHS PCT: 27 % (ref 12.0–46.0)
Lymphs Abs: 1.8 10*3/uL (ref 0.7–4.0)
MCH: 31 pg (ref 25.1–34.0)
MCHC: 32.7 g/dL (ref 30.0–36.0)
MCHC: 32.2 g/dL (ref 31.5–36.0)
MCV: 95.7 fl (ref 78.0–100.0)
MCV: 96.2 fL (ref 79.5–101.0)
MONO#: 0.6 10*3/uL (ref 0.1–0.9)
MONO%: 9.9 % (ref 0.0–14.0)
Monocytes Absolute: 0.5 10*3/uL (ref 0.1–1.0)
Monocytes Relative: 7.4 % (ref 3.0–12.0)
NEUT#: 4 10*3/uL (ref 1.5–6.5)
NEUT%: 63.1 % (ref 38.4–76.8)
Neutro Abs: 4.1 10*3/uL (ref 1.4–7.7)
Neutrophils Relative %: 62.4 % (ref 43.0–77.0)
PLATELETS: 198 10*3/uL (ref 150.0–400.0)
Platelets: 190 10*3/uL (ref 145–400)
RBC: 4.92 Mil/uL (ref 3.87–5.11)
RBC: 4.94 10*6/uL (ref 3.70–5.45)
RDW: 14.9 % (ref 11.5–15.5)
RDW: 14.3 % (ref 11.2–14.5)
WBC: 6.5 10*3/uL (ref 4.0–10.5)
WBC: 6.4 10*3/uL (ref 3.9–10.3)
lymph#: 1.5 10*3/uL (ref 0.9–3.3)

## 2015-03-26 LAB — COMPREHENSIVE METABOLIC PANEL (CC13)
ALT: 17 U/L (ref 0–55)
AST: 19 U/L (ref 5–34)
Albumin: 3.9 g/dL (ref 3.5–5.0)
Alkaline Phosphatase: 106 U/L (ref 40–150)
Anion Gap: 11 mEq/L (ref 3–11)
BILIRUBIN TOTAL: 0.71 mg/dL (ref 0.20–1.20)
BUN: 19.2 mg/dL (ref 7.0–26.0)
CALCIUM: 10.2 mg/dL (ref 8.4–10.4)
CHLORIDE: 105 meq/L (ref 98–109)
CO2: 26 mEq/L (ref 22–29)
CREATININE: 1.2 mg/dL — AB (ref 0.6–1.1)
EGFR: 44 mL/min/{1.73_m2} — ABNORMAL LOW (ref >90)
Glucose: 147 mg/dl — ABNORMAL HIGH (ref 70–140)
Potassium: 4.1 mEq/L (ref 3.5–5.1)
SODIUM: 142 meq/L (ref 136–145)
Total Protein: 7.1 g/dL (ref 6.4–8.3)

## 2015-03-26 MED ORDER — EXEMESTANE 25 MG PO TABS: 25.0000 mg | ORAL_TABLET | Freq: Every day | ORAL | Status: DC

## 2015-03-26 NOTE — Progress Notes (Signed)
[facility] Follow-up office visit  ID: Adrienne [name] OB: [birth year]  MR#: [id number]  WIO#:[id number]  PCP: Adrienne Man, MD  CHIEF COMPLAINT: breast cancer f/u  Oncology History   Breast cancer, left breast   Staging form: Breast, AJCC 7th Edition     Clinical stage from 09/12/2012: Stage IA (T1b, N0, M0) - Unsigned Breast cancer   Staging form: Breast, AJCC 7th Edition     Clinical stage from 09/29/2012: Stage IA (T1, N0, cM0) - Unsigned     Pathologic: No stage assigned - Unsigned       Breast cancer, left breast (HCC)   08/25/2012 Receptors her2 ER 100% positive, PR 88% positive, HER-2 negative, Ki-67 16%   08/30/2012 Initial Diagnosis Breast cancer, left breast   09/12/2012 Pathology Results T1bN0 Grade 1 invasive ductal carcinoma, and DCIS.   09/12/2012 Surgery Left breast lumpectomy and sentinel lymph node biopsy, negative margins.   11/09/2012 - 12/13/2012 Radiation Therapy Adjuvant breast radiation   12/2012 -  Anti-estrogen oral therapy Anastrozole 1 mg daily, switched to Aromasin 25 mg daily in Jan 2015 due to diarrhea.     CURRENT THERAPY: Aromasin daily  INTERVAL HISTORY: [name] returns for follow-up. She is doing very well overall. She tolerates Aromasin well, no significant side effects, except mild stiffness in the morning, which resolves in the morning. She complains about limping, no significant hip pain. She lives alone female, her husband is eating a dementia unit, and she visits him every day. She is in the process to sell her house, and plan to move to a independent living facility. She has her daughter and son who lives in the town.  REVIEW OF SYSTEMS: A 10 point review of systems was conducted and is otherwise negative except for what is noted above.     PAST MEDICAL HISTORY: Past Medical History  Diagnosis Date  . Hyperlipidemia   . Hypertension     Does not see a cardiologist  . History of frequent urinary tract infections   . Breast  cancer ([facility]) 08/25/12    Invasive ductal ca,DCIS  . Allergy   . Diabetes mellitus     type II  . Hearing loss   . Gout   . Osteopenia   . Hx of radiation therapy 11/22/12- 12/19/12    breast 4250 cGy 17 sessions, left breast boost 750 cGy 3 sessions    PAST SURGICAL HISTORY: Past Surgical History  Procedure Laterality Date  . Appendectomy    . Cholecystectomy    . Total abdominal hysterectomy w/ bilateral salpingoophorectomy    . Breast surgery  1990's    right- fibroid cyst  . Septoplasty    . Breast lumpectomy with needle localization and axillary sentinel lymph node bx Left 09/12/2012    Procedure: LEFT NEEDLE LOCALIZATION BREAST LUMPECTOMY TION AND AXILLARY SENTINEL LYMPH NODE BX;  Surgeon: Edward Jolly, MD;  Location: [facility];  Service: General;  Laterality: Left;  . Abdominal hysterectomy Bilateral 1999    w/b/l salpingo-oopherectomy    FAMILY HISTORY Family History  Problem Relation Age of Onset  . Hypertension Mother   . Coronary artery disease Mother   . Hypertension Father   . Cancer Cousin     breast    GYNECOLOGIC HISTORY: menarche at age 5, g46 p3, s/p TAH/BSO in early 53s.  Was on HRT for several years (cannot recall the duration).  No h/o abnormal pap smears or sexually transmitted infections.    SOCIAL HISTORY: Lives  in [place], [place] in a three story house.  Her husband lives at a nursing home due to dementia.  She is a retired Pharmacist, hospital.  Her son and daughter live in [place].      ADVANCED DIRECTIVES: Not in place.    HEALTH MAINTENANCE: Social History  Substance Use Topics  . Smoking status: Former Smoker -- 0.25 packs/day for 4 years    Types: Cigarettes  . Smokeless tobacco: None     Comment: Cigarette use was 50 years ago  . Alcohol Use: Yes     Comment: Occ glass of wine with dinner      Mammogram: 08/24/2014 (negative)  Colonoscopy: 2011 Bone Density Scan: 01/13/13, osteopenia T score -1.8  Pap Smear: s/p tah/bso Eye Exam:  due Vitamin D Level:  Normal, 04/14/13 Lipid Panel: unsure   Allergies  Allergen Reactions  . Penicillins Hives and Swelling  . Radiaplexrx [Woun'Dres Hydrogel Wound Dress] Hives    Current Outpatient Prescriptions  Medication Sig Dispense Refill  . ACCU-CHEK FASTCLIX LANCETS MISC 1 each by Does not apply route daily. 102 each 3  . allopurinol (ZYLOPRIM) 300 MG tablet Take 1 tablet (300 mg total) by mouth daily. 90 tablet 3  . aspirin 81 MG tablet Take 81 mg by mouth daily.    Marland Kitchen atenolol (TENORMIN) 25 MG tablet TAKE ONE-HALF TABLET BY MOUTH TWICE DAILY 30 tablet 1  . calcium-vitamin D 250-100 MG-UNIT per tablet Take 1 tablet by mouth 2 (two) times daily.    . Chlorpheniramine Maleate (CHLOR-TABLETS PO) Take by mouth.    Marland Kitchen exemestane (AROMASIN) 25 MG tablet Take 1 tablet (25 mg total) by mouth daily. 90 tablet 3  . fish oil-omega-3 fatty acids 1000 MG capsule Take 1 g by mouth daily.    Marland Kitchen glucose blood (ACCU-CHEK AVIVA PLUS) test strip test twice daily 100 each 11  . metFORMIN (GLUCOPHAGE) 500 MG tablet TAKE ONE TABLET BY MOUTH TWICE DAILY 180 tablet 3  . Multiple Vitamins-Minerals (CENTRUM SILVER) tablet Take 1 tablet by mouth daily.      Marland Kitchen OVER THE COUNTER MEDICATION Take by mouth 2 (two) times daily. Presavision- for macular degeneration prevention    . loratadine (CLARITIN) 10 MG tablet Take 10 mg by mouth daily as needed.      No current facility-administered medications for this visit.    OBJECTIVE: Filed Vitals:   03/26/15 0943  BP: 151/94  Pulse: 110  Temp: 97.4 F (36.3 C)  Resp: 18     Body mass index is 26.22 kg/(m^2).     GENERAL: Patient is a well appearing female in no acute distress HEENT:  Sclerae anicteric.  Oropharynx clear and moist. No ulcerations or evidence of oropharyngeal candidiasis. Neck is supple.  NODES:  No cervical, supraclavicular, or axillary lymphadenopathy palpated.  BREAST EXAM: left breast s/p lumpectomy, no nodularity, no masses, right  breast no masses or nodules, benign bilateral breast exam. LUNGS:  Clear to auscultation bilaterally.  No wheezes or rhonchi. HEART:  Regular rate and rhythm. No murmur appreciated. ABDOMEN:  Soft, nontender.  Positive, normoactive bowel sounds. No organomegaly palpated. MSK:  No focal spinal tenderness to palpation. Full range of motion bilaterally in the upper extremities. EXTREMITIES:  No peripheral edema.   SKIN:  Clear with no obvious rashes or skin changes. No nail dyscrasia. NEURO:  Nonfocal. Well oriented.  Appropriate affect. ECOG FS:1 - Symptomatic but completely ambulatory  LAB RESULTS: CBC Latest Ref Rng 03/26/2015 09/25/2014 03/27/2014  WBC 3.9 - 10.3  10e3/uL 6.4 9.8 6.7  Hemoglobin 11.6 - 15.9 g/dL 15.3 14.5 15.0  Hematocrit 34.8 - 46.6 % 47.5(H) 44.0 45.9  Platelets 145 - 400 10e3/uL 190 289 192.0    CMP Latest Ref Rng 03/26/2015 09/25/2014 09/04/2014  Glucose 70 - 140 mg/dl 147(H) 127 137(H)  BUN 7.0 - 26.0 mg/dL 19.2 20.5 24(H)  Creatinine 0.6 - 1.1 mg/dL 1.2(H) 1.2(H) 1.15  Sodium 136 - 145 mEq/L 142 141 141  Potassium 3.5 - 5.1 mEq/L 4.1 3.9 4.4  Chloride 96 - 112 mEq/L - - 104  CO2 22 - 29 mEq/L _0 Calcium 8.4 - 10.4 mg/dL 10.2 10.7(H) 10.4  Total Protein 6.4 - 8.3 g/dL 7.1 7.3 -  Total Bilirubin 0.20 - 1.20 mg/dL 0.71 0.44 -  Alkaline Phos 40 - 150 U/L 106 80 -  AST 5 - 34 U/L 19 19 -  ALT 0 - 55 U/L 17 22 -     STUDIES:   ASSESSMENT: 80 y.o. [place], [place] woman   1. pT1bN0, stage IA invasive ductal carcinoma of the left breast, grade I, ER 100%, PR 88%, Ki-67 16%, HER-2/neu negative. -she went on to have a lumpectomy with sentinel lymph node biopsy on 09/12/2012, s/p radiation therapy from 11/09/2012 through 12/19/2012.  -She is clinically doing very well, her physical exam today and last mammogram in April showed no evidence of recurrence -she is adjuvant Aromasin, tolerating well, we'll continue, for total 5 years, until 11/2017  -She'll continue  surveillance with annual mammogram, follow-up with an exam with [name] every 6 months -I encouraged her to continue healthy diet and regular exercise.  2. Osteopenia -She is on vitamin D and calcium 1 tablets a day. -Giving her worsening hypercalcemia, I told her to stop calcium and vitamin D supplement -Her recent bone density scan from 03/05/2015 showed osteopenia, with 10 year probability of major ostial parotic fracture 25%, and hip fracture 15%. Giving the high risk of fracture, she would benefit from biphosphonate or Prolia injection. I discussed the benefit and potential side effects, and written material of Prolia was given to her -She will discuss with her primary care physician Dr. Sherren Mocha next week   3. Hypercalcemia -Her calcium was 10.7 6 months ago, 10.2 today, since I reduced her calcium supplement -I total her to stop calcium for now  -She knows to drink adequately and avoid dehydration  -She'll follow up with primary care physician for further workup.  -I will check her PTH level on next visit   Plan -Continue Aromasin, I refilled for her  -I'll see her back in 6 month -next mammogram due in April 2017  -I will discuss with her PCP Dr. Sherren Mocha about starting biphosphonate or Prolia for her   I spent 25 minutes counseling the patient face to face.  The total time spent in the appointment was 30 minutes.  Truitt Merle   03/26/2015 10:21 AM

## 2015-03-27 ENCOUNTER — Telehealth: Payer: Self-pay | Admitting: Hematology

## 2015-03-27 NOTE — Telephone Encounter (Signed)
per pof to sch pt mamma-cld & sch pt mamma-cld pt and left message and gave time & date/location of appt

## 2015-04-01 ENCOUNTER — Encounter: Payer: Self-pay | Admitting: Family Medicine

## 2015-04-01 ENCOUNTER — Ambulatory Visit (INDEPENDENT_AMBULATORY_CARE_PROVIDER_SITE_OTHER): Payer: Medicare Other | Admitting: Family Medicine

## 2015-04-01 VITALS — BP 130/90 | Temp 97.7°F | Ht 61.5 in | Wt 144.0 lb

## 2015-04-01 DIAGNOSIS — I4891 Unspecified atrial fibrillation: Secondary | ICD-10-CM | POA: Insufficient documentation

## 2015-04-01 DIAGNOSIS — R000 Tachycardia, unspecified: Secondary | ICD-10-CM | POA: Insufficient documentation

## 2015-04-01 DIAGNOSIS — E785 Hyperlipidemia, unspecified: Secondary | ICD-10-CM

## 2015-04-01 DIAGNOSIS — M899 Disorder of bone, unspecified: Secondary | ICD-10-CM

## 2015-04-01 DIAGNOSIS — I10 Essential (primary) hypertension: Secondary | ICD-10-CM | POA: Diagnosis not present

## 2015-04-01 DIAGNOSIS — E139 Other specified diabetes mellitus without complications: Secondary | ICD-10-CM

## 2015-04-01 DIAGNOSIS — Z0001 Encounter for general adult medical examination with abnormal findings: Secondary | ICD-10-CM

## 2015-04-01 DIAGNOSIS — M949 Disorder of cartilage, unspecified: Secondary | ICD-10-CM

## 2015-04-01 DIAGNOSIS — E1110 Type 2 diabetes mellitus with ketoacidosis without coma: Secondary | ICD-10-CM

## 2015-04-01 MED ORDER — METFORMIN HCL 500 MG PO TABS: 500.0000 mg | ORAL_TABLET | Freq: Two times a day (BID) | ORAL | Status: DC

## 2015-04-01 MED ORDER — ATENOLOL 25 MG PO TABS
ORAL_TABLET | ORAL | Status: DC
Start: 2015-04-01 — End: 2015-04-05

## 2015-04-01 MED ORDER — ALLOPURINOL 300 MG PO TABS: 300.0000 mg | ORAL_TABLET | Freq: Every day | ORAL | Status: DC

## 2015-04-01 NOTE — Patient Instructions (Addendum)
Continue current medications except the Tenormin is 1 full tablet daily  You have a rhythm disturbances called atrial fibrillation,,,,,,,, we will get you set up in the cardiology clinic for further evaluation  Return in 6 months for follow-up on your diabetes,,,,,,,,, Adrienne [name] or Adrienne [name] or Dr. Martinique  Walk 30 minutes daily  Get together with your daughter this fall and get a healthcare power of attorney and living will accomplished

## 2015-04-01 NOTE — Progress Notes (Signed)
Pre visit review using our clinic review tool, if applicable. No additional management support is needed unless otherwise documented below in the visit note. 

## 2015-04-01 NOTE — Progress Notes (Signed)
   Subjective:    Patient ID: Adrienne [name], female    DOB: 1934-11-19, 80 y.o.   MRN: [id number]  HPI [name] is a 80 year old married female nonsmoker who comes in today for general physical examination because of a history of gout, hypertension, diabetes type 2, and breast cancer  She takes allopurinol 300 mg daily to prevent gout  She takes Tenormin 12.5 mg daily for hypertension BP today is 130/90  She takes OTC Claritin for allergic rhinitis and metformin 500 mg twice a day for diabetes her blood sugar ranges 140-150 A1c recently 7.3%  She gets routine eye care, dental care, BSE monthly, annual mammography, colon cancer previously was normal therefore it her age not repeated  Vaccinations all up to date computer says she needs a tetanus booster of her she had a tetanus booster in 2015.  Cognitive function normal she walks on a daily basis home health safety reviewed no issues identified, no guns in the house, she does not have a healthcare power of attorney nor living well. She was encouraged to get with her daughter and get a healthcare power of attorney and living well  She goes to the nursing home daily. Her husband is in the memory care unit   Review of Systems  Constitutional: Negative.   HENT: Negative.   Eyes: Negative.   Respiratory: Negative.   Cardiovascular: Negative.   Gastrointestinal: Negative.   Endocrine: Negative.   Genitourinary: Negative.   Musculoskeletal: Negative.   Skin: Negative.   Allergic/Immunologic: Negative.   Neurological: Negative.   Hematological: Negative.   Psychiatric/Behavioral: Negative.        Objective:   Physical Exam  Constitutional: She appears well-developed and well-nourished.  HENT:  Head: Normocephalic and atraumatic.  Right Ear: External ear normal.  Left Ear: External ear normal.  Nose: Nose normal.  Mouth/Throat: Oropharynx is clear and moist.  Eyes: EOM are normal. Pupils are equal, round, and reactive to light.   Neck: Normal range of motion. Neck supple. No JVD present. No tracheal deviation present. No thyromegaly present.  Cardiovascular: Normal rate, normal heart sounds and intact distal pulses.  Exam reveals no gallop and no friction rub.   No murmur heard. Rate is 90-110 irregular irregular....... suspect AF,,,,,,,, EKG pending  Pulmonary/Chest: Effort normal and breath sounds normal. No stridor. No respiratory distress. She has no wheezes. She has no rales. She exhibits no tenderness.  Abdominal: Soft. Bowel sounds are normal. She exhibits no distension and no mass. There is no tenderness. There is no rebound and no guarding.  Genitourinary:  Surgical scar well-healed. Tattoos from previous radiation therapy. Recent evaluation by oncology and general surgery no evidence of recurrent cancer  Musculoskeletal: Normal range of motion.  Lymphadenopathy:    She has no cervical adenopathy.  Neurological: She is alert. She has normal reflexes. No cranial nerve deficit. She exhibits normal muscle tone. Coordination normal.  Skin: Skin is warm and dry. No rash noted. No erythema. No pallor.  Psychiatric: She has a normal mood and affect. Her behavior is normal. Judgment and thought content normal.          Assessment & Plan:  Healthy female  Hypertension......... continue current therapy  History of breast cancer....... in remission continue follow-up by oncology and general surgery  History of gout...Marland KitchenMarland KitchenMarland Kitchen continue allopurinol  Diabetes type 2..... Continue metformin... Walk 30 minutes daily..... Follow-up A1c in 6 months  Irregular heart rate........... atrial fibrillation

## 2015-04-02 ENCOUNTER — Ambulatory Visit: Payer: Medicare Other | Attending: Hematology | Admitting: Physical Therapy

## 2015-04-02 ENCOUNTER — Other Ambulatory Visit: Payer: Medicare Other

## 2015-04-02 ENCOUNTER — Ambulatory Visit: Payer: Medicare Other | Admitting: Hematology

## 2015-04-02 DIAGNOSIS — R269 Unspecified abnormalities of gait and mobility: Secondary | ICD-10-CM | POA: Insufficient documentation

## 2015-04-02 DIAGNOSIS — R531 Weakness: Secondary | ICD-10-CM

## 2015-04-02 DIAGNOSIS — M6281 Muscle weakness (generalized): Secondary | ICD-10-CM | POA: Insufficient documentation

## 2015-04-02 NOTE — Patient Instructions (Signed)
Functional Quadriceps: Sit to Stand    Sit on edge of chair, feet flat on floor. Cross your hands across your chest.  Stand upright, extending knees fully. Repeat __10__ times per set. Do __1-3__ sets per session. Do __1-2__ sessions per day.  http://orth.exer.us/735   Copyright  VHI. All rights reserved.  Hip Abduction (Standing)    Stand with support. Stand tall on the left leg. Lift right leg out to side, keeping toe forward. Hold for _1__ seconds. Relax for _0__ seconds.  Repeat _10__ times. Do 1-3 sets per session.  Do _1-2__ times a day. Repeat with other leg.   Copyright  VHI. All rights reserved.

## 2015-04-02 NOTE — Therapy (Signed)
Adrienne [name], [place], [address] Phone: [phone]   Fax:  [phone]  Physical Therapy Evaluation  Patient Details  Name: Adrienne [name] MRN: [id number] Date of Birth: January 14, 1935 Referring Provider: Dr. Truitt Merle  Encounter Date: 04/02/2015      PT End of Session - 04/02/15 1401    Visit Number 1   Number of Visits 9   Date for PT Re-Evaluation 05/04/15   PT Start Time 1302   PT Stop Time 1401   PT Time Calculation (min) 59 min   Activity Tolerance Patient tolerated treatment well   Behavior During Therapy [facility] for tasks assessed/performed      Past Medical History  Diagnosis Date  . Hyperlipidemia   . Hypertension     Does not see a cardiologist  . History of frequent urinary tract infections   . Breast cancer ([place]) 08/25/12    Invasive ductal ca,DCIS  . Allergy   . Diabetes mellitus     type II  . Hearing loss   . Gout   . Osteopenia   . Hx of radiation therapy 11/22/12- 12/19/12    breast 4250 cGy 17 sessions, left breast boost 750 cGy 3 sessions    Past Surgical History  Procedure Laterality Date  . Appendectomy    . Cholecystectomy    . Total abdominal hysterectomy w/ bilateral salpingoophorectomy    . Breast surgery  1990's    right- fibroid cyst  . Septoplasty    . Breast lumpectomy with needle localization and axillary sentinel lymph node bx Left 09/12/2012    Procedure: LEFT NEEDLE LOCALIZATION BREAST LUMPECTOMY TION AND AXILLARY SENTINEL LYMPH NODE BX;  Surgeon: Edward Jolly, MD;  Location: [facility];  Service: General;  Laterality: Left;  . Abdominal hysterectomy Bilateral 1999    w/b/l salpingo-oopherectomy    There were no vitals filed for this visit.  Visit Diagnosis:  Decreased strength - Plan: PT plan of care cert/re-cert  Abnormality of gait - Plan: PT plan of care cert/re-cert      Subjective Assessment - 04/02/15 1312    Subjective Has a limp and "would  like to get rid of it."  Friends have told her she's limping worse lately.  [facility].  Stopped about 6 years ago.   Pertinent History Breast cancer on left diagnosed in about 2014; treated with lumpectomy (2 lymph nodes removed, both negative); had radiation.  Is on exemestane.  Diabetic and wants to improve AI-C a bit.  Was just told yesterday that she is on atrial fibrillation.  HTN under good control with meds.   Patient Stated Goals get rid of the limp   Currently in Pain? No/denies            [facility] PT Assessment - 04/02/15 0001    Assessment   Medical Diagnosis limp   Referring Provider Dr. Truitt Merle   Precautions   Precautions Other (comment)   Precaution Comments cancer precautions   Restrictions   Weight Bearing Restrictions No   Balance Screen   Has the patient fallen in the past 6 months No   Has the patient had a decrease in activity level because of a fear of falling?  No   Is the patient reluctant to leave their home because of a fear of falling?  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   Leisure tries to go 3 days/week to walk on treadmill at the [employer]; doesn't go consistently   Cognition   Overall Cognitive Status Within Functional Limits for tasks assessed   Posture/Postural Control   Posture/Postural Control Postural limitations   Postural Limitations --  leans right a bit   ROM / Strength   AROM / PROM / Strength AROM;Strength   AROM   Overall AROM Comments Both LEs grossly WFL at all joints   Strength   Strength Assessment Site Hip;Knee;Ankle   Right/Left Hip Right;Left   Right Hip Flexion 5/5   Right Hip Extension 4/5    Right Hip External Rotation  3+/5;5/5   Right Hip Internal Rotation 4/5   Right Hip ABduction 4-/5   Right Hip ADduction 5/5   Left Hip Flexion 4+/5   Left Hip Extension 4/5   Left Hip External Rotation 3+/5   Left Hip Internal Rotation 4/5   Left Hip ABduction 3+/5   Left Hip ADduction 4+/5   Right/Left Knee Right;Left   Right Knee Flexion 4+/5   Right Knee Extension 5/5   Left Knee Flexion 4+/5   Left Knee Extension 4+/5   Right/Left Ankle Right;Left   Right Ankle Dorsiflexion 4+/5   Right Ankle Plantar Flexion 4/5  approx.:  tip toe walks with difficulty   Right Ankle Inversion 4/5   Right Ankle Eversion 4/5   Left Ankle Dorsiflexion 4/5   Left Ankle Plantar Flexion 4/5  approx.:  tip toe walks with difficulty   Left Ankle Inversion 5/5   Left Ankle Eversion 4/5   Ambulation/Gait   Ambulation/Gait Yes   Ambulation/Gait Assistance 6: Modified independent (Device/Increase time)   Assistive device None   Gait Pattern Trendelenburg;Lateral trunk lean to right                           PT Education - 04/02/15 1401    Education provided Yes   Education Details standing hip abduction (being sure to stand tall and not lean) and sit to/from stand; also educated about Ambulance person program at [employer]) Educated Patient   Methods Explanation;Demonstration;Handout   Comprehension Verbalized understanding;Returned demonstration                [facility] Goals - 04/02/15 1408    CC Long Term Goal  #1   Title Patient will be independent in a home exercise program for LE strengthening and safe self-progression   Time 4   Period Weeks   Status New   CC Long Term Goal  #2   Title Patient will be ready to transition to the PREP program or to independent Y exercise program.   Time 4   Period Weeks   Status New   CC Long Term Goal  #3   Title Left hip abductor strength will be at least 4/5.   Time 4   Period Weeks   Status New   CC Long  Term Goal  #4   Title Patient will report perception of decreased limp and improving strength.   Time 4   Period Weeks   Status New  Plan - 04/16/15 1402    Clinical Impression Statement This is a very pleasant older woman who walks with a Trendelenberg gait, with hip abductors weak on both sides, left hip more than right.  She has some weakness in many muscle groups of both LEs.  She has a h/o left hip pain, but the hip doesn't currently hurt.  [facility].     Pt will benefit from skilled therapeutic intervention in order to improve on the following deficits Abnormal gait;Decreased strength   Rehab Potential Excellent   Clinical Impairments Affecting Rehab Potential none   PT Frequency 2x / week   PT Duration 4 weeks   PT Treatment/Interventions Therapeutic exercise;Patient/family education;Gait training   PT Next Visit Plan review HEP started today; add to it and do LE strengthening with gym equipment   PT Home Exercise Plan see education section   Consulted and Agree with Plan of Care Patient          G-Codes - 04-16-15 1410    Functional Assessment Tool Used clinical judgement (including observation of limp)   Functional Limitation Mobility: Walking and moving around   Mobility: Walking and Moving Around Current Status [id number]) At least 20 percent but less than 40 percent impaired, limited or restricted   Mobility: Walking and Moving Around Goal Status [phone]) At least 1 percent but less than 20 percent impaired, limited or restricted       Problem List Patient Active Problem List   Diagnosis Date Noted  . Rapid heart rate 04/01/2015  . Atrial fibrillation ([place]) 04/01/2015  . Diabetes 1.5, managed as type 2 ([place]) 09/04/2014  . Breast cancer, left breast ([place]) 08/30/2012  . Breast cancer ([place]) 08/25/2012  . UTI (urinary tract infection) 08/06/2011  . Influenza 08/04/2011  . Viral URI  06/16/2011  . Gout 06/01/2011  . Vaginitis, atrophic 06/01/2011  . RENAL INSUFFICIENCY, ACUTE 05/28/2010  . HEMATURIA, HX OF 05/28/2010  . Disorder of bone and cartilage 12/08/2007  . Hyperlipidemia 08/10/2007  . Essential hypertension 08/10/2007    Atari Novick April 16, 2015, 2:12 PM  [place], [place], [address] Phone: [phone]   Fax:  [phone]  Name: Adrienne [name] MRN: [id number] Date of Birth: 1934/09/21   Serafina Royals, PT 2015/04/16 2:12 PM

## 2015-04-03 ENCOUNTER — Ambulatory Visit: Payer: Medicare Other | Admitting: Physical Therapy

## 2015-04-03 DIAGNOSIS — R531 Weakness: Secondary | ICD-10-CM

## 2015-04-03 DIAGNOSIS — R269 Unspecified abnormalities of gait and mobility: Secondary | ICD-10-CM

## 2015-04-03 DIAGNOSIS — M6281 Muscle weakness (generalized): Secondary | ICD-10-CM | POA: Diagnosis not present

## 2015-04-03 NOTE — Therapy (Signed)
[place], [place], [address] Phone: [phone]   Fax:  [phone]  Physical Therapy Treatment  Patient Details  Name: Adrienne [name] MRN: [id number] Date of Birth: [birth year] Referring Provider: Dr. Truitt Merle  Encounter Date: 04/03/2015      PT End of Session - 04/03/15 1518    Visit Number 2   Number of Visits 9   Date for PT Re-Evaluation 05/04/15   PT Start Time 1435   PT Stop Time 1515   PT Time Calculation (min) 40 min   Activity Tolerance Patient tolerated treatment well  but fatigued at end of session   Behavior During Therapy [facility] for tasks assessed/performed      Past Medical History  Diagnosis Date  . Hyperlipidemia   . Hypertension     Does not see a cardiologist  . History of frequent urinary tract infections   . Breast cancer ([place]) 08/25/12    Invasive ductal ca,DCIS  . Allergy   . Diabetes mellitus     type II  . Hearing loss   . Gout   . Osteopenia   . Hx of radiation therapy 11/22/12- 12/19/12    breast 4250 cGy 17 sessions, left breast boost 750 cGy 3 sessions    Past Surgical History  Procedure Laterality Date  . Appendectomy    . Cholecystectomy    . Total abdominal hysterectomy w/ bilateral salpingoophorectomy    . Breast surgery  1990's    right- fibroid cyst  . Septoplasty    . Breast lumpectomy with needle localization and axillary sentinel lymph node bx Left 09/12/2012    Procedure: LEFT NEEDLE LOCALIZATION BREAST LUMPECTOMY TION AND AXILLARY SENTINEL LYMPH NODE BX;  Surgeon: Edward Jolly, MD;  Location: [facility];  Service: General;  Laterality: Left;  . Abdominal hysterectomy Bilateral 1999    w/b/l salpingo-oopherectomy    There were no vitals filed for this visit.  Visit Diagnosis:  Decreased strength  Abnormality of gait      Subjective Assessment - 04/03/15 1436    Subjective "I did those things last night one time and one time this  morning; I did the recreation center video exercise."   Currently in Pain? Yes   Pain Score 2    Pain Location Knee   Pain Orientation Right   Pain Descriptors / Indicators Other (Comment)  "I'm aware I've got a knee."   Aggravating Factors  weightbearing on right leg for exercise   Pain Relieving Factors hasn't tried anything                         [facility] Adult PT Treatment/Exercise - 04/03/15 0001    Knee/Hip Exercises: Aerobic   Nustep level 5 x 5 minutes, pace at approx. 70 steps per  minute (set by patient)  HR 114, moderate dyspnea after this; pt. reported fatigue   Knee/Hip Exercises: Standing   Lateral Step Up Right;Left;10 reps;Hand Hold: 1;Step Height: 4";Step Height: 6"  mild dyspnea   Other Standing Knee Exercises sidestepping 20 feet x 2 each direction   Knee/Hip Exercises: Supine   Bridges AROM;Both;10 reps   Single Leg Bridge AROM;Right;Left;10 reps   Knee/Hip Exercises: Sidelying   Hip ABduction AROM;Right;Left;10 reps   Ankle Exercises: Standing   Heel Raises Other (comment)  30 reps                PT Education - 04/02/15 1401  Education provided Yes   Education Details standing hip abduction (being sure to stand tall and not lean) and sit to/from stand; also educated about PREP program at [employer]) Educated Patient   Methods Explanation;Demonstration;Handout   Comprehension Verbalized understanding;Returned demonstration                [place] - 04-08-2015 1408    CC Long Term Goal  #1   Title Patient will be independent in a home exercise program for LE strengthening and safe self-progression   Time 4   Period Weeks   Status New   CC Long Term Goal  #2   Title Patient will be ready to transition to the PREP program or to independent Y exercise program.   Time 4   Period Weeks   Status New   CC Long Term Goal  #3   Title Left hip abductor strength will be at least 4/5.   Time 4   Period  Weeks   Status New   CC Long Term Goal  #4   Title Patient will report perception of decreased limp and improving strength.   Time 4   Period Weeks   Status New            Plan - 04/03/15 1518    Clinical Impression Statement Patient did well with exercise today, though she had done her short HEP and had done a televised exercise program earlier today and so was tired by the end of session.   Pt will benefit from skilled therapeutic intervention in order to improve on the following deficits Abnormal gait;Decreased strength   Rehab Potential Excellent   Clinical Impairments Affecting Rehab Potential none   PT Frequency 2x / week   PT Duration 4 weeks   PT Treatment/Interventions Therapeutic exercise;Patient/family education   PT Next Visit Plan continue exercise with focus on LEs; progress HEP as appropriate   PT Home Exercise Plan see instructions for today   Consulted and Agree with Plan of Care Patient          G-Codes - 08-Apr-2015 1410    Functional Assessment Tool Used clinical judgement (including observation of limp)   Functional Limitation Mobility: Walking and moving around   Mobility: Walking and Moving Around Current Status [id number]) At least 20 percent but less than 40 percent impaired, limited or restricted   Mobility: Walking and Moving Around Goal Status [phone]) At least 1 percent but less than 20 percent impaired, limited or restricted      Problem List Patient Active Problem List   Diagnosis Date Noted  . Rapid heart rate 04/01/2015  . Atrial fibrillation ([place]) 04/01/2015  . Diabetes 1.5, managed as type 2 ([place]) 09/04/2014  . Breast cancer, left breast ([place]) 08/30/2012  . Breast cancer ([place]) 08/25/2012  . UTI (urinary tract infection) 08/06/2011  . Influenza 08/04/2011  . Viral URI 06/16/2011  . Gout 06/01/2011  . Vaginitis, atrophic 06/01/2011  . RENAL INSUFFICIENCY, ACUTE 05/28/2010  . HEMATURIA, HX OF 05/28/2010  . Disorder of bone and cartilage  12/08/2007  . Hyperlipidemia 08/10/2007  . Essential hypertension 08/10/2007    SALISBURY,DONNA 04/03/2015, 3:21 PM  [place], [place], [address] Phone: [phone]   Fax:  [phone]  Name: MARIBELA KENNEDY MRN: [id number] Date of Birth: 1935/03/29    Serafina Royals, PT 04/03/2015 3:21 PM

## 2015-04-03 NOTE — Patient Instructions (Signed)
Continue home exercise program given and do daily.  Avoid doing video exercise program on days you come to therapy.

## 2015-04-05 ENCOUNTER — Encounter: Payer: Self-pay | Admitting: *Deleted

## 2015-04-05 ENCOUNTER — Encounter: Payer: Self-pay | Admitting: Cardiology

## 2015-04-05 ENCOUNTER — Ambulatory Visit (INDEPENDENT_AMBULATORY_CARE_PROVIDER_SITE_OTHER): Payer: Medicare Other | Admitting: Cardiology

## 2015-04-05 DIAGNOSIS — I480 Paroxysmal atrial fibrillation: Secondary | ICD-10-CM

## 2015-04-05 MED ORDER — ATENOLOL 50 MG PO TABS
50.0000 mg | ORAL_TABLET | Freq: Every day | ORAL | Status: DC
Start: 2015-04-05 — End: 2015-06-17

## 2015-04-05 MED ORDER — APIXABAN 5 MG PO TABS: 5.0000 mg | ORAL_TABLET | Freq: Two times a day (BID) | ORAL | Status: DC

## 2015-04-05 NOTE — Assessment & Plan Note (Signed)
Patient with newly diagnosed atrial fibrillation. She is asymptomatic. Her rate is elevated. Increase atenolol to 50 mg daily. She may require a Holter monitor in the future. Embolic risk factors include female sex, age greater than 5, diabetes mellitus and hypertension. CHADSvasc 5. Discontinue aspirin. Begin apixaban 5 mg by mouth twice a day. In 4 weeks check hemoglobin and renal function. Note recent TSH normal. We will decide at future office visit whether to try and cardiovert. She is asymptomatic and rate control/anticoagulation may be best option.

## 2015-04-05 NOTE — Progress Notes (Signed)
HPI: 80 year old female for evaluation of atrial fibrillation. Recently noted to be in atrial fibrillation on routine electrocardiogram and cardiology asked to evaluate. Patient denies dyspnea on exertion, orthopnea, PND, pedal edema, chest pain, palpitations, syncope, history of bleeding or falls.  Current Outpatient Prescriptions  Medication Sig Dispense Refill  . ACCU-CHEK FASTCLIX LANCETS MISC 1 each by Does not apply route daily. 102 each 3  . allopurinol (ZYLOPRIM) 300 MG tablet Take 1 tablet (300 mg total) by mouth daily. 90 tablet 3  . aspirin 81 MG tablet Take 81 mg by mouth daily.    Marland Kitchen atenolol (TENORMIN) 25 MG tablet 1 tablet daily 90 tablet 3  . Chlorpheniramine Maleate (CHLOR-TABLETS PO) Take 1 tablet by mouth daily.     Marland Kitchen exemestane (AROMASIN) 25 MG tablet Take 1 tablet (25 mg total) by mouth daily. 90 tablet 3  . fish oil-omega-3 fatty acids 1000 MG capsule Take 1 g by mouth daily.    Marland Kitchen glucose blood (ACCU-CHEK AVIVA PLUS) test strip test twice daily 100 each 11  . loratadine (CLARITIN) 10 MG tablet Take 10 mg by mouth daily as needed for allergies.     . metFORMIN (GLUCOPHAGE) 500 MG tablet Take 1 tablet (500 mg total) by mouth 2 (two) times daily. 180 tablet 3  . Multiple Vitamin (MULTIVITAMIN) tablet Take 1 tablet by mouth daily.    Marland Kitchen OVER THE COUNTER MEDICATION Take by mouth 2 (two) times daily. Presavision- for macular degeneration prevention     No current facility-administered medications for this visit.    Allergies  Allergen Reactions  . Penicillins Hives and Swelling  . Radiaplexrx [Woun'Dres Hydrogel Wound Dress] Hives     Past Medical History  Diagnosis Date  . Hyperlipidemia   . Hypertension   . History of frequent urinary tract infections   . Breast cancer ([place]) 08/25/12    Invasive ductal ca,DCIS  . Allergy   . Diabetes mellitus     type II  . Hearing loss   . Gout   . Osteopenia   . Hx of radiation therapy 11/22/12- 12/19/12    breast 4250  cGy 17 sessions, left breast boost 750 cGy 3 sessions    Past Surgical History  Procedure Laterality Date  . Appendectomy    . Cholecystectomy    . Breast surgery  1990's    right- fibroid cyst  . Septoplasty    . Breast lumpectomy with needle localization and axillary sentinel lymph node bx Left 09/12/2012    Procedure: LEFT NEEDLE LOCALIZATION BREAST LUMPECTOMY TION AND AXILLARY SENTINEL LYMPH NODE BX;  Surgeon: Edward Jolly, MD;  Location: [facility];  Service: General;  Laterality: Left;  . Abdominal hysterectomy Bilateral 1999    w/b/l salpingo-oopherectomy  . Tonsillectomy      Social History   Social History  . Marital Status: Married    Spouse Name: N/A  . Number of Children: 3  . Years of Education: N/A   Occupational History  . Not on file.   Social History Main Topics  . Smoking status: Former Smoker -- 0.25 packs/day for 4 years    Types: Cigarettes  . Smokeless tobacco: Not on file     Comment: Cigarette use was 50 years ago  . Alcohol Use: Yes     Comment: Occ glass of wine with dinner  . Drug Use: No  . Sexual Activity: No     Comment: menarche age 44, 18st birth age 50, G60, HRT  x 7 years?   Other Topics Concern  . Not on file   Social History Narrative    Family History  Problem Relation Age of Onset  . Hypertension Mother   . Heart disease Mother     Murmur and irregular heart disease  . Hypertension Father   . Cancer Cousin     breast    ROS: no fevers or chills, productive cough, hemoptysis, dysphasia, odynophagia, melena, hematochezia, dysuria, hematuria, rash, seizure activity, orthopnea, PND, pedal edema, claudication. Remaining systems are negative.  Physical Exam:   Blood pressure 130/62, pulse 113, height 5' 1.75" (1.568 m), weight 141 lb 4.8 oz (64.093 kg).  General:  Well developed/well nourished in NAD Skin warm/dry Patient not depressed No peripheral clubbing Back-normal HEENT-normal/normal eyelids Neck supple/normal  carotid upstroke bilaterally; no bruits; no JVD; no thyromegaly chest - CTA/ normal expansion CV - irregular and tachycardic/normal S1 and S2; no murmurs, rubs or gallops;  PMI nondisplaced Abdomen -NT/ND, no HSM, no mass, + bowel sounds, no bruit 2+ femoral pulses, no bruits Ext-no edema, chords, 2+ DP Neuro-grossly nonfocal  ECG 04/01/2015-atrial fibrillation, nonspecific ST changes.  Electrocardiogram today shows atrial fibrillation at a rate of 113. Nonspecific T-wave changes.

## 2015-04-05 NOTE — Patient Instructions (Signed)
Medication Instructions:   STOP ASPIRIN  START ELIQUIS 5 MG ONE TABLET TWICE DAILY  INCREASE ATENOLOL TO 50 MG ONCE DAILY= 2 OF THE 25 MG TABLETS ONCE DAILY  Labwork:  Your physician recommends that you return for lab work in: 4 WEEKS  Testing/Procedures:  Your physician has requested that you have an echocardiogram. Echocardiography is a painless test that uses sound waves to create images of your heart. It provides your doctor with information about the size and shape of your heart and how well your heart's chambers and valves are working. This procedure takes approximately one hour. There are no restrictions for this procedure.    Follow-Up:  Your physician recommends that you schedule a follow-up appointment in: [place]  If you need a refill on your cardiac medications before your next appointment, please call your pharmacy.

## 2015-04-05 NOTE — Assessment & Plan Note (Signed)
Blood pressure controlled. Continue present medications. 

## 2015-04-05 NOTE — Assessment & Plan Note (Signed)
Management per primary care. 

## 2015-04-09 ENCOUNTER — Ambulatory Visit: Payer: Medicare Other | Attending: Hematology

## 2015-04-09 DIAGNOSIS — R269 Unspecified abnormalities of gait and mobility: Secondary | ICD-10-CM | POA: Diagnosis present

## 2015-04-09 DIAGNOSIS — M6281 Muscle weakness (generalized): Secondary | ICD-10-CM | POA: Insufficient documentation

## 2015-04-09 DIAGNOSIS — R531 Weakness: Secondary | ICD-10-CM

## 2015-04-09 NOTE — Therapy (Signed)
[name], [place], [address] Phone: [phone]   Fax:  [phone]  Physical Therapy Treatment  Patient Details  Name: Adrienne [name] MRN: [id number] Date of Birth: 03-May-1935 Referring Provider: Dr. Truitt Merle  Encounter Date: 04/09/2015      PT End of Session - 04/09/15 1203    Visit Number 3   Number of Visits 9   Date for PT Re-Evaluation 05/04/15   PT Start Time 1016   PT Stop Time 1102   PT Time Calculation (min) 46 min   Activity Tolerance Patient tolerated treatment well   Behavior During Therapy [facility] for tasks assessed/performed      Past Medical History  Diagnosis Date  . Hyperlipidemia   . Hypertension   . History of frequent urinary tract infections   . Breast cancer ([place]) 08/25/12    Invasive ductal ca,DCIS  . Allergy   . Diabetes mellitus     type II  . Hearing loss   . Gout   . Osteopenia   . Hx of radiation therapy 11/22/12- 12/19/12    breast 4250 cGy 17 sessions, left breast boost 750 cGy 3 sessions    Past Surgical History  Procedure Laterality Date  . Appendectomy    . Cholecystectomy    . Breast surgery  1990's    right- fibroid cyst  . Septoplasty    . Breast lumpectomy with needle localization and axillary sentinel lymph node bx Left 09/12/2012    Procedure: LEFT NEEDLE LOCALIZATION BREAST LUMPECTOMY TION AND AXILLARY SENTINEL LYMPH NODE BX;  Surgeon: Edward Jolly, MD;  Location: [place];  Service: General;  Laterality: Left;  . Abdominal hysterectomy Bilateral 1999    w/b/l salpingo-oopherectomy  . Tonsillectomy      There were no vitals filed for this visit.  Visit Diagnosis:  Decreased strength  Abnormality of gait      Subjective Assessment - 04/09/15 1022    Subjective I felt good after last visit, a little sore, enough to know I did something but not too much. I'm a little tired today because I was out in the rain trying to get the cat back in  the house!   Currently in Pain? No/denies                         [facility] Adult PT Treatment/Exercise - 04/09/15 0001    Knee/Hip Exercises: Aerobic   Nustep level 5 x 4 minutes, pace at approx. 70 steps per  minute (set by patient)  Pt with increase fatigue today so 1 less minute   Knee/Hip Exercises: Standing   Hip Flexion Stengthening;Both;Knee bent  In // bars   Lateral Step Up Both;10 reps;Hand Hold: 1;Step Height: 4"   Lateral Step Up Limitations Verbal cuing for keeping hips level throughout and pt was able to return correct demo and included Rt hip hike with Lt step up   Forward Step Up Both;10 reps;Hand Hold: 1;Step Height: 4"  In // bars raising contralateral LE   Other Standing Knee Exercises In // bars: Sidestepping with mini squat 2 sets of each, heel-toe gait front and back 2 sets each direction; standing on 4" step for bil hip hiking with tactile cuing by therapist throughout for correct technique, slow front march with 90 degrees hip flexion 2 sets, hip 3 way raises with 2 lbs on each ankle and using visual cuing from the mirror throughout whole treament  to help pt minimize Trendelenburg Gait which was less visibly    Knee/Hip Exercises: Supine   Bridges AROM;Both;2 sets;10 reps   Straight Leg Raises Strengthening;Both;2 sets;10 reps  2 lbs each ankle                        [facility] Term Clinic Goals - 04/02/15 1408    CC Long Term Goal  #1   Title Patient will be independent in a home exercise program for LE strengthening and safe self-progression   Time 4   Period Weeks   Status New   CC Long Term Goal  #2   Title Patient will be ready to transition to the PREP program or to independent Y exercise program.   Time 4   Period Weeks   Status New   CC Long Term Goal  #3   Title Left hip abductor strength will be at least 4/5.   Time 4   Period Weeks   Status New   CC Long Term Goal  #4   Title Patient will report perception of decreased  limp and improving strength.   Time 4   Period Weeks   Status New            Plan - 04/09/15 1205    Clinical Impression Statement Pt continued to do well with exercises today and was able to return good demo of hip hiking which will help decrease her Trendelenburg gait. This was noticeably less at the end of session today and pt did not c/o of increased fatigue.    Pt will benefit from skilled therapeutic intervention in order to improve on the following deficits Abnormal gait;Decreased strength   Rehab Potential Excellent   Clinical Impairments Affecting Rehab Potential none   PT Frequency 2x / week   PT Duration 4 weeks   PT Treatment/Interventions Therapeutic exercise;Patient/family education   PT Next Visit Plan continue exercise with focus on LEs; progress HEP as appropriate and include hip hiking if pt can cont returning correct demo   Consulted and Agree with Plan of Care Patient        Problem List Patient Active Problem List   Diagnosis Date Noted  . Rapid heart rate 04/01/2015  . Atrial fibrillation ([place]) 04/01/2015  . Diabetes 1.5, managed as type 2 ([employer]) 09/04/2014  . Breast cancer, left breast ([place]) 08/30/2012  . Breast cancer ([place]) 08/25/2012  . UTI (urinary tract infection) 08/06/2011  . Influenza 08/04/2011  . Viral URI 06/16/2011  . Gout 06/01/2011  . Vaginitis, atrophic 06/01/2011  . RENAL INSUFFICIENCY, ACUTE 05/28/2010  . HEMATURIA, HX OF 05/28/2010  . Disorder of bone and cartilage 12/08/2007  . Hyperlipidemia 08/10/2007  . Essential hypertension 08/10/2007    Otelia Limes, PTA 04/09/2015, 12:11 PM  [employer] [place], [place], [address] Phone: [phone]   Fax:  [phone]  Name: Adrienne [name] MRN: [id number] Date of Birth: 01-Jan-1935

## 2015-04-11 ENCOUNTER — Encounter: Payer: Self-pay | Admitting: Physical Therapy

## 2015-04-11 ENCOUNTER — Ambulatory Visit: Payer: Medicare Other | Admitting: Physical Therapy

## 2015-04-11 DIAGNOSIS — R531 Weakness: Secondary | ICD-10-CM

## 2015-04-11 DIAGNOSIS — M6281 Muscle weakness (generalized): Secondary | ICD-10-CM | POA: Diagnosis not present

## 2015-04-11 DIAGNOSIS — R269 Unspecified abnormalities of gait and mobility: Secondary | ICD-10-CM

## 2015-04-11 NOTE — Therapy (Signed)
[place], [place], [address] Phone: [phone]   Fax:  [phone]  Physical Therapy Treatment  Patient Details  Name: Adrienne [name] MRN: [id number] Date of Birth: 1934/06/09 Referring Provider: Dr. Truitt Merle  Encounter Date: 04/11/2015      PT End of Session - 04/11/15 1057    Visit Number 4   Number of Visits 9   Date for PT Re-Evaluation 05/04/15   PT Start Time 1018   PT Stop Time 1100   PT Time Calculation (min) 42 min   Activity Tolerance Patient tolerated treatment well   Behavior During Therapy [facility] for tasks assessed/performed      Past Medical History  Diagnosis Date  . Hyperlipidemia   . Hypertension   . History of frequent urinary tract infections   . Breast cancer ([place]) 08/25/12    Invasive ductal ca,DCIS  . Allergy   . Diabetes mellitus     type II  . Hearing loss   . Gout   . Osteopenia   . Hx of radiation therapy 11/22/12- 12/19/12    breast 4250 cGy 17 sessions, left breast boost 750 cGy 3 sessions    Past Surgical History  Procedure Laterality Date  . Appendectomy    . Cholecystectomy    . Breast surgery  1990's    right- fibroid cyst  . Septoplasty    . Breast lumpectomy with needle localization and axillary sentinel lymph node bx Left 09/12/2012    Procedure: LEFT NEEDLE LOCALIZATION BREAST LUMPECTOMY TION AND AXILLARY SENTINEL LYMPH NODE BX;  Surgeon: Edward Jolly, MD;  Location: [facility];  Service: General;  Laterality: Left;  . Abdominal hysterectomy Bilateral 1999    w/b/l salpingo-oopherectomy  . Tonsillectomy      There were no vitals filed for this visit.  Visit Diagnosis:  Decreased strength  Abnormality of gait      Subjective Assessment - 04/11/15 1022    Subjective Still walking with a limp but I felt fine after last treatment.  I was just tired.   Pertinent History Breast cancer on left diagnosed in about 2014; treated with lumpectomy (2  lymph nodes removed, both negative); had radiation.  Is on exemestane.  Diabetic and wants to improve AI-C a bit.  Was just told yesterday that she is on atrial fibrillation.  HTN under good control with meds.   Patient Stated Goals get rid of the limp   Currently in Pain? No/denies                         [facility] Adult PT Treatment/Exercise - 04/11/15 0001    Knee/Hip Exercises: Aerobic   Nustep Level 5 x5 minutes   Knee/Hip Exercises: Standing   Hip Abduction Both;2 sets;10 reps;Stengthening   Wall Squat 10 reps   Other Standing Knee Exercises In II bars: tandem walk x2 forward and x2 backward; grapevine x2; side stepping with left leg leading and yellow theraband around ankles x2   Knee/Hip Exercises: Seated   Marching Strengthening;Right;Left;20 reps   Marching Weights 2 lbs.   Abduction/Adduction  Strengthening;Both;20 reps  Hip adduction ball squeeze x3 seconds   Sit to Sand 10 reps;without UE support   Knee/Hip Exercises: Supine   Bridges AROM;Both;2 sets;10 reps   Bridges with [employer] Both;10 reps   Bridges with Clamshell Both;10 reps  with yellow theraband   Straight Leg Raises Strengthening;2 sets;10 reps;Left  2 lbs each  ankle   Knee/Hip Exercises: Sidelying   Hip ABduction Strengthening;Left;10 reps  no weight                        Long Term Clinic Goals - 04/02/15 1408    CC Long Term Goal  #1   Title Patient will be independent in a home exercise program for LE strengthening and safe self-progression   Time 4   Period Weeks   Status New   CC Long Term Goal  #2   Title Patient will be ready to transition to the PREP program or to independent Y exercise program.   Time 4   Period Weeks   Status New   CC Long Term Goal  #3   Title Left hip abductor strength will be at least 4/5.   Time 4   Period Weeks   Status New   CC Long Term Goal  #4   Title Patient will report perception of decreased limp and improving strength.    Time 4   Period Weeks   Status New            Plan - 04/11/15 1100    Clinical Impression Statement Pt tolerated all exercises very well but had some c/o fatigue.  Her left hip abduction weakness is significant and contributing to her gait deficits.  She will benefit from the PREP program at the [facility] after D/C from PT.     Rehab Potential Excellent   Clinical Impairments Affecting Rehab Potential none   PT Frequency 2x / week   PT Duration 4 weeks   PT Treatment/Interventions Therapeutic exercise;Patient/family education   PT Next Visit Plan Continue strengthening with focus on left hip abduction   Consulted and Agree with Plan of Care Patient        Problem List Patient Active Problem List   Diagnosis Date Noted  . Rapid heart rate 04/01/2015  . Atrial fibrillation ([place]) 04/01/2015  . Diabetes 1.5, managed as type 2 ([place]) 09/04/2014  . Breast cancer, left breast ([place]) 08/30/2012  . Breast cancer ([place]) 08/25/2012  . UTI (urinary tract infection) 08/06/2011  . Influenza 08/04/2011  . Viral URI 06/16/2011  . Gout 06/01/2011  . Vaginitis, atrophic 06/01/2011  . RENAL INSUFFICIENCY, ACUTE 05/28/2010  . HEMATURIA, HX OF 05/28/2010  . Disorder of bone and cartilage 12/08/2007  . Hyperlipidemia 08/10/2007  . Essential hypertension 08/10/2007    Annia Friendly, PT 04/11/2015 11:05 AM   [place], [place], [address] Phone: [phone]   Fax:  [phone]  Name: Adrienne [name] MRN: [id number] Date of Birth: [birth year]

## 2015-04-12 ENCOUNTER — Telehealth: Payer: Self-pay | Admitting: Cardiology

## 2015-04-12 ENCOUNTER — Telehealth: Payer: Self-pay

## 2015-04-12 NOTE — Telephone Encounter (Signed)
Patient called to state that her pharmacy - states she need prior auth  For Eliquis  Patient states she has not started medication  ELIQUIS yet  Patient informed patient to go have free thitry day coupon filled.  In process , for prior authorization with Jobe Igo RN  OFFICE WILL NOTIFY HER IF MEDICATION NEEDS TO BE CHANGE.

## 2015-04-12 NOTE — Telephone Encounter (Signed)
Adrienne [name] is calling because she wants to know if should be on a different type of medication because , her insurance company will not pay for the Eliquis .Marland Kitchen Please call .  Thanks

## 2015-04-12 NOTE — Telephone Encounter (Signed)
Prior auth for Eliquis 5 mg sent to [facility]. 

## 2015-04-15 ENCOUNTER — Telehealth: Payer: Self-pay

## 2015-04-15 NOTE — Telephone Encounter (Signed)
ELIQUIS 5mg  approved through 04/08/2016. PA # [id number].

## 2015-04-16 ENCOUNTER — Ambulatory Visit: Payer: Medicare Other | Admitting: Physical Therapy

## 2015-04-16 DIAGNOSIS — R531 Weakness: Secondary | ICD-10-CM

## 2015-04-16 DIAGNOSIS — R269 Unspecified abnormalities of gait and mobility: Secondary | ICD-10-CM

## 2015-04-16 DIAGNOSIS — M6281 Muscle weakness (generalized): Secondary | ICD-10-CM | POA: Diagnosis not present

## 2015-04-16 NOTE — Therapy (Signed)
[place], [place], [address] Phone: [phone]   Fax:  [phone]  Physical Therapy Treatment  Patient Details  Name: Adrienne [name] MRN: [id number] Date of Birth: 1935-01-23 Referring Provider: Dr. Truitt Merle  Encounter Date: 04/16/2015      PT End of Session - 04/16/15 1713    Visit Number 5   Number of Visits 9   Date for PT Re-Evaluation 05/04/15   PT Start Time 9211   PT Stop Time 1516   PT Time Calculation (min) 48 min   Activity Tolerance Patient tolerated treatment well   Behavior During Therapy [facility] for tasks assessed/performed      Past Medical History  Diagnosis Date  . Hyperlipidemia   . Hypertension   . History of frequent urinary tract infections   . Breast cancer ([place]) 08/25/12    Invasive ductal ca,DCIS  . Allergy   . Diabetes mellitus     type II  . Hearing loss   . Gout   . Osteopenia   . Hx of radiation therapy 11/22/12- 12/19/12    breast 4250 cGy 17 sessions, left breast boost 750 cGy 3 sessions    Past Surgical History  Procedure Laterality Date  . Appendectomy    . Cholecystectomy    . Breast surgery  1990's    right- fibroid cyst  . Septoplasty    . Breast lumpectomy with needle localization and axillary sentinel lymph node bx Left 09/12/2012    Procedure: LEFT NEEDLE LOCALIZATION BREAST LUMPECTOMY TION AND AXILLARY SENTINEL LYMPH NODE BX;  Surgeon: Edward Jolly, MD;  Location: [facility];  Service: General;  Laterality: Left;  . Abdominal hysterectomy Bilateral 1999    w/b/l salpingo-oopherectomy  . Tonsillectomy      There were no vitals filed for this visit.  Visit Diagnosis:  Decreased strength  Abnormality of gait      Subjective Assessment - 04/16/15 1432    Subjective I did go to the heart doctor and they did another EKG and I do have a-fib so I started a new medication (Eliquis).  Is doing the exercises most days, 2-3 times a day.   Currently in Pain? No/denies                         [facility] Adult PT Treatment/Exercise - 04/16/15 0001    Knee/Hip Exercises: Aerobic   Elliptical incline 1, resistance 1 x 2 minutes  moderate dyspnea afterward and needed monitored rest   Nustep Level 5 x5 minutes   Knee/Hip Exercises: Standing   Lateral Step Up Both;2 sets;10 reps;Hand Hold: 1;Step Height: 8"   Other Standing Knee Exercises facing wall with hands on wall, sidestep with squat x 15 feet x 2 each way, right and left   Knee/Hip Exercises: Supine   Single Leg Bridge AROM;Right;Left;10 reps   Knee/Hip Exercises: Sidelying   Hip ABduction AROM;Left;2 sets;10 reps                PT Education - 04/16/15 1712    Education provided Yes   Education Details added sidestep with squat, lateral step-ups, and single leg bridging to HEP; advised patient to do original HEP once a day and this newer HEP at another time during the dfay   Person(s) Educated Patient   Methods Explanation;Demonstration;Handout   Comprehension Verbalized understanding;Returned demonstration  [place] Goals - 04/16/15 1511    CC Long Term Goal  #1   Title Patient will be independent in a home exercise program for LE strengthening and safe self-progression   Status Partially Met   CC Long Term Goal  #2   Title Patient will be ready to transition to the PREP program or to independent Y exercise program.   Status Achieved  plans to start in January   CC Long Term Goal  #3   Title Left hip abductor strength will be at least 4/5.   Status On-going   CC Long Term Goal  #4   Title Patient will report perception of decreased limp and improving strength.   Status On-going            Plan - 04/16/15 1713    Clinical Impression Statement Patient did well overall with exercise today, but elliptical trainer was very challenging for her and her limit was 2 minutes on that.  Other exercise also worked  the hip muscle groups and newer exercises were added to her HEP.    Pt will benefit from skilled therapeutic intervention in order to improve on the following deficits Abnormal gait;Decreased strength   Rehab Potential Excellent   Clinical Impairments Affecting Rehab Potential none   PT Frequency 2x / week   PT Duration 4 weeks   PT Treatment/Interventions Therapeutic exercise   PT Next Visit Plan Continue strengthening with focus on left hip abduction   PT Home Exercise Plan see instructions for today   Recommended Other Services Patient wants to start the PREP program at [facility] in January, so will need a referral to that.   Consulted and Agree with Plan of Care Patient        Problem List Patient Active Problem List   Diagnosis Date Noted  . Rapid heart rate 04/01/2015  . Atrial fibrillation ([place]) 04/01/2015  . Diabetes 1.5, managed as type 2 ([place]) 09/04/2014  . Breast cancer, left breast ([place]) 08/30/2012  . Breast cancer ([place]) 08/25/2012  . UTI (urinary tract infection) 08/06/2011  . Influenza 08/04/2011  . Viral URI 06/16/2011  . Gout 06/01/2011  . Vaginitis, atrophic 06/01/2011  . RENAL INSUFFICIENCY, ACUTE 05/28/2010  . HEMATURIA, HX OF 05/28/2010  . Disorder of bone and cartilage 12/08/2007  . Hyperlipidemia 08/10/2007  . Essential hypertension 08/10/2007    Syd Manges 04/16/2015, 5:17 PM  [address] [place], [place], [address] Phone: [phone]   Fax:  [phone]  Name: REIGAN TOLLIVER MRN: [id number] Date of Birth: 24-Aug-1934    Serafina Royals, PT 04/16/2015 5:17 PM

## 2015-04-16 NOTE — Patient Instructions (Signed)
Lateral Step-Up    Stand with _8__ inch step placed in L direction. Hold onto the railing.  Step onto step with left foot, facing forward, and without pushing off with the ground foot. Finish with ground foot in touch balance on step and return _10__ times. __2_ sets _1-2__ times per day on each leg.  http://gglj.exer.us/167   Copyright  VHI. All rights reserved.    Sideways Walk With Squat    Stand facing your countertop with hands on it for support. Side step to right and drop hips--DROP THEM ABOUT HALF AS LOW AS YOU SEE IN THIS PICTURE. Head and chest up, shin vertical. Lunge foot pointing forward. Lift from hips and back foot to standing position. Repeat in walking motion in same direction. Do 3-5 times down and back the length of your counter. Do reps both ways for one set.  Do 1-2x/day.  Copyright  VHI. All rights reserved.   Bridging (Single Leg)    Lie on back with feet shoulder width apart and right leg straight. Lift hips toward the ceiling while keeping leg straight. Hold __1__ seconds. Repeat __10__ times. Do __1__ sessions per day.  http://gt2.exer.us/359   Copyright  VHI. All rights reserved.

## 2015-04-18 ENCOUNTER — Ambulatory Visit: Payer: Medicare Other

## 2015-04-18 DIAGNOSIS — R531 Weakness: Secondary | ICD-10-CM

## 2015-04-18 DIAGNOSIS — R269 Unspecified abnormalities of gait and mobility: Secondary | ICD-10-CM

## 2015-04-18 DIAGNOSIS — M6281 Muscle weakness (generalized): Secondary | ICD-10-CM | POA: Diagnosis not present

## 2015-04-18 NOTE — Therapy (Signed)
[place], [place], [address] Phone: [phone]   Fax:  [phone]  Physical Therapy Treatment  Patient Details  Name: Adrienne [name] MRN: [id number] Date of Birth: [birth year] Referring Provider: Dr. Truitt Merle  Encounter Date: 04/18/2015      PT End of Session - 04/18/15 1154    Visit Number 6   Number of Visits 9   Date for PT Re-Evaluation 05/04/15   PT Start Time 1106   PT Stop Time 1150   PT Time Calculation (min) 44 min   Activity Tolerance Patient tolerated treatment well   Behavior During Therapy [facility] for tasks assessed/performed      Past Medical History  Diagnosis Date  . Hyperlipidemia   . Hypertension   . History of frequent urinary tract infections   . Breast cancer ([place]) 08/25/12    Invasive ductal ca,DCIS  . Allergy   . Diabetes mellitus     type II  . Hearing loss   . Gout   . Osteopenia   . Hx of radiation therapy 11/22/12- 12/19/12    breast 4250 cGy 17 sessions, left breast boost 750 cGy 3 sessions    Past Surgical History  Procedure Laterality Date  . Appendectomy    . Cholecystectomy    . Breast surgery  1990's    right- fibroid cyst  . Septoplasty    . Breast lumpectomy with needle localization and axillary sentinel lymph node bx Left 09/12/2012    Procedure: LEFT NEEDLE LOCALIZATION BREAST LUMPECTOMY TION AND AXILLARY SENTINEL LYMPH NODE BX;  Surgeon: Edward Jolly, MD;  Location: [facility];  Service: General;  Laterality: Left;  . Abdominal hysterectomy Bilateral 1999    w/b/l salpingo-oopherectomy  . Tonsillectomy      There were no vitals filed for this visit.  Visit Diagnosis:  Decreased strength  Abnormality of gait      Subjective Assessment - 04/18/15 1111    Subjective Felt okay after last visit, could feel it in my Lt hip, wasn't hurting. I could just feel it. I tried doing the side steps at home but I have a railing on only 1 side so it was  hard to do the other side but I'll figure something out.    Currently in Pain? No/denies                         [facility] Adult PT Treatment/Exercise - 04/18/15 0001    Knee/Hip Exercises: Aerobic   Recumbent Bike Level 3 x 5 minutes (both NuSteps were occupied initially)   Nustep Level 5 x5 minutes   Knee/Hip Exercises: Standing   Lateral Step Up Both;2 sets;10 reps;Hand Hold: 1;Step Height: 8"   Lateral Step Up Limitations Cuing required to keeps hips level/no trunk lean with Lt LE   Other Standing Knee Exercises In // bars: Practiced hip hiking with tactile cuing for full motion and verbal cuing to keep knees straight so motion is isolated to hips; tandem walking front and backwards 2 sets each; mini squats on Fitter board with 4 legs for 10 reps with cuing for technique, bil sidestepping in mini squat position   Other Standing Knee Exercises Sit to stand with yellow theraband around thighs 2 sets of 10 reps and no UE support   Knee/Hip Exercises: Supine   Single Leg Bridge AROM;Right;Left;10 reps   Knee/Hip Exercises: Sidelying   Hip ABduction AROM;Left;10 reps   Hip ABduction  Limitations 1 set as pt was having some Lt hip pain   Clams In Rt S/L for Lt  2 sets of 10 reps, no pain with this                PT Education - 04/18/15 1153    Education provided Yes   Education Details Issued yellow theraband with ends tied together for pt to continue with sit-stand using band around thighs, but also can use it with band around ankles for her hip 3 way raises   Person(s) Educated Patient   Methods Explanation;Demonstration   Comprehension Verbalized understanding;Returned demonstration                [place] - 04/16/15 1511    CC Long Term Goal  #1   Title Patient will be independent in a home exercise program for LE strengthening and safe self-progression   Status Partially Met   CC Long Term Goal  #2   Title Patient will be ready to  transition to the PREP program or to independent Y exercise program.   Status Achieved  plans to start in January   CC Long Term Goal  #3   Title Left hip abductor strength will be at least 4/5.   Status On-going   CC Long Term Goal  #4   Title Patient will report perception of decreased limp and improving strength.   Status On-going            Plan - 04/18/15 1155    Clinical Impression Statement Contined with hip strengthening today though pt didn't feel up to the elliptical today, especially after other 10 mins of cardio which she tolerated well. Reviewed proper hip hiking technique as well and pt did well with this reporting she could feel her hip muscles working. Pt appears to be progressing well with LE strength and endurance.   Pt will benefit from skilled therapeutic intervention in order to improve on the following deficits Abnormal gait;Decreased strength   Rehab Potential Excellent   Clinical Impairments Affecting Rehab Potential none   PT Frequency 2x / week   PT Duration 4 weeks   PT Treatment/Interventions Therapeutic exercise   PT Next Visit Plan Continue strengthening with focus on left hip abduction   PT Home Exercise Plan see education section   Consulted and Agree with Plan of Care Patient        Problem List Patient Active Problem List   Diagnosis Date Noted  . Rapid heart rate 04/01/2015  . Atrial fibrillation ([place]) 04/01/2015  . Diabetes 1.5, managed as type 2 ([place]) 09/04/2014  . Breast cancer, left breast ([place]) 08/30/2012  . Breast cancer ([place]) 08/25/2012  . UTI (urinary tract infection) 08/06/2011  . Influenza 08/04/2011  . Viral URI 06/16/2011  . Gout 06/01/2011  . Vaginitis, atrophic 06/01/2011  . RENAL INSUFFICIENCY, ACUTE 05/28/2010  . HEMATURIA, HX OF 05/28/2010  . Disorder of bone and cartilage 12/08/2007  . Hyperlipidemia 08/10/2007  . Essential hypertension 08/10/2007    Otelia Limes, PTA 04/18/2015, 12:01 PM  [place], [place], [address] Phone: [phone]   Fax:  [phone]  Name: Adrienne [name] MRN: [id number] Date of Birth: 01-17-1935

## 2015-04-18 NOTE — Telephone Encounter (Signed)
eliquis apporved

## 2015-04-19 ENCOUNTER — Other Ambulatory Visit: Payer: Self-pay

## 2015-04-19 ENCOUNTER — Telehealth: Payer: Self-pay | Admitting: Cardiology

## 2015-04-19 ENCOUNTER — Ambulatory Visit (HOSPITAL_COMMUNITY): Payer: Medicare Other | Attending: Cardiology

## 2015-04-19 DIAGNOSIS — E119 Type 2 diabetes mellitus without complications: Secondary | ICD-10-CM | POA: Diagnosis not present

## 2015-04-19 DIAGNOSIS — I059 Rheumatic mitral valve disease, unspecified: Secondary | ICD-10-CM | POA: Insufficient documentation

## 2015-04-19 DIAGNOSIS — I517 Cardiomegaly: Secondary | ICD-10-CM | POA: Insufficient documentation

## 2015-04-19 DIAGNOSIS — E785 Hyperlipidemia, unspecified: Secondary | ICD-10-CM | POA: Insufficient documentation

## 2015-04-19 DIAGNOSIS — I480 Paroxysmal atrial fibrillation: Secondary | ICD-10-CM | POA: Diagnosis not present

## 2015-04-19 DIAGNOSIS — I10 Essential (primary) hypertension: Secondary | ICD-10-CM | POA: Insufficient documentation

## 2015-04-19 DIAGNOSIS — I340 Nonrheumatic mitral (valve) insufficiency: Secondary | ICD-10-CM | POA: Insufficient documentation

## 2015-04-19 DIAGNOSIS — I4891 Unspecified atrial fibrillation: Secondary | ICD-10-CM | POA: Diagnosis present

## 2015-04-19 NOTE — Telephone Encounter (Signed)
°  Follow Up   Pt is returning call regarding her ECHO results. Please call.

## 2015-04-19 NOTE — Telephone Encounter (Signed)
Left message for patient to call.

## 2015-04-23 ENCOUNTER — Ambulatory Visit: Payer: Medicare Other

## 2015-04-23 DIAGNOSIS — R531 Weakness: Secondary | ICD-10-CM

## 2015-04-23 DIAGNOSIS — R269 Unspecified abnormalities of gait and mobility: Secondary | ICD-10-CM

## 2015-04-23 DIAGNOSIS — M6281 Muscle weakness (generalized): Secondary | ICD-10-CM | POA: Diagnosis not present

## 2015-04-23 NOTE — Therapy (Signed)
[place], [place], [address] Phone: [phone]   Fax:  [phone]  Physical Therapy Treatment  Patient Details  Name: Adrienne [name] MRN: [id number] Date of Birth: 10/09/1934 Referring Provider: Dr. Truitt Merle  Encounter Date: 04/23/2015      PT End of Session - 04/23/15 1108    Visit Number 7   Number of Visits 9   Date for PT Re-Evaluation 05/04/15   PT Start Time 5885   PT Stop Time 1105   PT Time Calculation (min) 42 min   Activity Tolerance Patient tolerated treatment well   Behavior During Therapy [facility] for tasks assessed/performed      Past Medical History  Diagnosis Date  . Hyperlipidemia   . Hypertension   . History of frequent urinary tract infections   . Breast cancer ([place]) 08/25/12    Invasive ductal ca,DCIS  . Allergy   . Diabetes mellitus     type II  . Hearing loss   . Gout   . Osteopenia   . Hx of radiation therapy 11/22/12- 12/19/12    breast 4250 cGy 17 sessions, left breast boost 750 cGy 3 sessions    Past Surgical History  Procedure Laterality Date  . Appendectomy    . Cholecystectomy    . Breast surgery  1990's    right- fibroid cyst  . Septoplasty    . Breast lumpectomy with needle localization and axillary sentinel lymph node bx Left 09/12/2012    Procedure: LEFT NEEDLE LOCALIZATION BREAST LUMPECTOMY TION AND AXILLARY SENTINEL LYMPH NODE BX;  Surgeon: Edward Jolly, MD;  Location: [place];  Service: General;  Laterality: Left;  . Abdominal hysterectomy Bilateral 1999    w/b/l salpingo-oopherectomy  . Tonsillectomy      There were no vitals filed for this visit.  Visit Diagnosis:  Decreased strength  Abnormality of gait      Subjective Assessment - 04/23/15 1037    Subjective Continued to feel good after my session. Have done all my HEP and they wen twell except the squats from last time made myknees hurt a little and they don't normally.     Currently in Pain? No/denies                         [facility] Adult PT Treatment/Exercise - 04/23/15 0001    Knee/Hip Exercises: Aerobic   Nustep Level 5 x8 minutes   Knee/Hip Exercises: Standing   Lateral Step Up Both;2 sets;10 reps;Hand Hold: 1;Step Height: 6"  Hip abduction with contralateral LE   Other Standing Knee Exercises In // bars: Practiced hip hiking with tactile cuing for full motion and verbal cuing to keep knees straight so motion is isolated to hips; tandem walking front and backwards, and then bil grapevine all 2 sets each; mini squats on Fitter board with 4 legs for 2 sets of 10 reps and +2 HHA with cuing for technique, bil sidestepping in mini squat position with demonstration throughout 4 sets   Knee/Hip Exercises: Supine   Hip Adduction Isometric Strengthening;Both;10 reps  Then with alternate march 2 sets of 5 reps   Bridges with Clamshell Strengthening;Both;2 sets;5 reps                        Long Term Clinic Goals - 04/16/15 1511    CC Long Term Goal  #1   Title Patient will be independent in a  home exercise program for LE strengthening and safe self-progression   Status Partially Met   CC Long Term Goal  #2   Title Patient will be ready to transition to the PREP program or to independent Y exercise program.   Status Achieved  plans to start in January   CC Long Term Goal  #3   Title Left hip abductor strength will be at least 4/5.   Status On-going   CC Long Term Goal  #4   Title Patient will report perception of decreased limp and improving strength.   Status On-going            Plan - 04/23/15 1113    Clinical Impression Statement Pt continues to tolerate treatment well and does very well with therapy requiring only 1-2 seated rest breaks. She is compliant with her HEP and progressing well towards goals.     Pt will benefit from skilled therapeutic intervention in order to improve on the following deficits Abnormal  gait;Decreased strength   Rehab Potential Excellent   Clinical Impairments Affecting Rehab Potential none   PT Frequency 2x / week   PT Duration 4 weeks   PT Treatment/Interventions Therapeutic exercise   PT Next Visit Plan Assess goals next visit and continue strengthening with focus on left hip abduction   Consulted and Agree with Plan of Care Patient        Problem List Patient Active Problem List   Diagnosis Date Noted  . Rapid heart rate 04/01/2015  . Atrial fibrillation ([place]) 04/01/2015  . Diabetes 1.5, managed as type 2 ([place]) 09/04/2014  . Breast cancer, left breast ([place]) 08/30/2012  . Breast cancer ([place]) 08/25/2012  . UTI (urinary tract infection) 08/06/2011  . Influenza 08/04/2011  . Viral URI 06/16/2011  . Gout 06/01/2011  . Vaginitis, atrophic 06/01/2011  . RENAL INSUFFICIENCY, ACUTE 05/28/2010  . HEMATURIA, HX OF 05/28/2010  . Disorder of bone and cartilage 12/08/2007  . Hyperlipidemia 08/10/2007  . Essential hypertension 08/10/2007    Otelia Limes, PTA 04/23/2015, 11:18 AM  [place], [place], [address] Phone: [phone]   Fax:  [phone]  Name: VERDEAN MURIN MRN: [id number] Date of Birth: May 28, 1934

## 2015-04-24 ENCOUNTER — Telehealth: Payer: Self-pay | Admitting: Cardiology

## 2015-04-24 NOTE — Telephone Encounter (Signed)
Pt aware of results, advised to call if further needs.

## 2015-04-24 NOTE — Telephone Encounter (Signed)
Mrs.Mulvey is returning call about her test results . Please call   Thanks

## 2015-04-25 ENCOUNTER — Ambulatory Visit: Payer: Medicare Other | Admitting: Physical Therapy

## 2015-04-25 ENCOUNTER — Other Ambulatory Visit: Payer: Self-pay | Admitting: Oncology

## 2015-04-25 ENCOUNTER — Encounter: Payer: Self-pay | Admitting: Physical Therapy

## 2015-04-25 DIAGNOSIS — R269 Unspecified abnormalities of gait and mobility: Secondary | ICD-10-CM

## 2015-04-25 DIAGNOSIS — R531 Weakness: Secondary | ICD-10-CM

## 2015-04-25 DIAGNOSIS — M6281 Muscle weakness (generalized): Secondary | ICD-10-CM | POA: Diagnosis not present

## 2015-04-25 NOTE — Therapy (Signed)
[name], [place], [address] Phone: [phone]   Fax:  [phone]  Physical Therapy Treatment  Patient Details  Name: Adrienne [name] MRN: [id number] Date of Birth: 1934-06-25 Referring Provider: Dr. Truitt Merle  Encounter Date: 04/25/2015      PT End of Session - 04/25/15 1024    Visit Number 8   Number of Visits 9   Date for PT Re-Evaluation 05/04/15   PT Start Time 1016   PT Stop Time 1058   PT Time Calculation (min) 42 min   Activity Tolerance Patient tolerated treatment well   Behavior During Therapy [facility] for tasks assessed/performed      Past Medical History  Diagnosis Date  . Hyperlipidemia   . Hypertension   . History of frequent urinary tract infections   . Breast cancer ([place]) 08/25/12    Invasive ductal ca,DCIS  . Allergy   . Diabetes mellitus     type II  . Hearing loss   . Gout   . Osteopenia   . Hx of radiation therapy 11/22/12- 12/19/12    breast 4250 cGy 17 sessions, left breast boost 750 cGy 3 sessions    Past Surgical History  Procedure Laterality Date  . Appendectomy    . Cholecystectomy    . Breast surgery  1990's    right- fibroid cyst  . Septoplasty    . Breast lumpectomy with needle localization and axillary sentinel lymph node bx Left 09/12/2012    Procedure: LEFT NEEDLE LOCALIZATION BREAST LUMPECTOMY TION AND AXILLARY SENTINEL LYMPH NODE BX;  Surgeon: Edward Jolly, MD;  Location: [place];  Service: General;  Laterality: Left;  . Abdominal hysterectomy Bilateral 1999    w/b/l salpingo-oopherectomy  . Tonsillectomy      There were no vitals filed for this visit.  Visit Diagnosis:  Decreased strength  Abnormality of gait      Subjective Assessment - 04/25/15 1022    Subjective No pain.  I feel like I'm more aware of my limp and now I can work on it and correct it.  I do feel like I'm making progress.   Pertinent History Breast cancer on left diagnosed  in about 2014; treated with lumpectomy (2 lymph nodes removed, both negative); had radiation.  Is on exemestane.  Diabetic and wants to improve AI-C a bit.  Was just told yesterday that she is on atrial fibrillation.  HTN under good control with meds.   Patient Stated Goals get rid of the limp   Currently in Pain? No/denies                         [facility] Adult PT Treatment/Exercise - 04/25/15 0001    Knee/Hip Exercises: Aerobic   Nustep Level 5 x8 minutes   Knee/Hip Exercises: Standing   Other Standing Knee Exercises In // bars: Practiced hip hiking with tactile cuing for full motion and verbal cuing to keep knees straight so motion is isolated to hips x20 left leg; tandem walking front and backwards, and then bil grapevine all 2 sets each; hip flexion with 2# weight on left ankle x20 with knee flexed and x20 with knee fully extended; left hip abduction x20 with 2# weight on ankle; side stepping with yellow theraband around ankle leading with left leg x4; mini squats 2x10 with UE support for balance; high hip march up and back in // bars   Other Standing Knee Exercises 1  Knee/Hip Exercises: Supine   Bridges AROM;10 reps;Both   Bridges with [employer] 10 reps;Strengthening;Both   Bridges with Clamshell 10 reps;Both;Strengthening   Knee/Hip Exercises: Sidelying   Hip ABduction AROM;Strengthening;Left;10 reps   Clams In right S/L, left hip abduction with ER 2x10                        Long Term Clinic Goals - 04/16/15 1511    CC Long Term Goal  #1   Title Patient will be independent in a home exercise program for LE strengthening and safe self-progression   Status Partially Met   CC Long Term Goal  #2   Title Patient will be ready to transition to the PREP program or to independent Y exercise program.   Status Achieved  plans to start in January   CC Long Term Goal  #3   Title Left hip abductor strength will be at least 4/5.   Status On-going   CC Long  Term Goal  #4   Title Patient will report perception of decreased limp and improving strength.   Status On-going            Plan - 04/25/15 1052    Clinical Impression Statement Strength and endurance with exercise seems to be improving.  She required no rests today during exercises.  She reported feeling weakness with sidelying hip abduction with ER.  Completed referral form today for PREP program and patient plans to contact the [facility] to get started.    Pt will benefit from skilled therapeutic intervention in order to improve on the following deficits Abnormal gait;Decreased strength   Rehab Potential Excellent   Clinical Impairments Affecting Rehab Potential none   PT Frequency 2x / week   PT Duration 4 weeks   PT Treatment/Interventions Therapeutic exercise   PT Next Visit Plan Discharge patient next visit per her request.  She feels ready to begin exercises at the [facility] with the PREP program.  Finalize and review HEP and test hip strength.   Consulted and Agree with Plan of Care Patient        Problem List Patient Active Problem List   Diagnosis Date Noted  . Rapid heart rate 04/01/2015  . Atrial fibrillation ([place]) 04/01/2015  . Diabetes 1.5, managed as type 2 ([place]) 09/04/2014  . Breast cancer, left breast ([place]) 08/30/2012  . Breast cancer ([place]) 08/25/2012  . UTI (urinary tract infection) 08/06/2011  . Influenza 08/04/2011  . Viral URI 06/16/2011  . Gout 06/01/2011  . Vaginitis, atrophic 06/01/2011  . RENAL INSUFFICIENCY, ACUTE 05/28/2010  . HEMATURIA, HX OF 05/28/2010  . Disorder of bone and cartilage 12/08/2007  . Hyperlipidemia 08/10/2007  . Essential hypertension 08/10/2007    Annia Friendly, PT 04/25/2015 10:59 AM   [place], [place], [address] Phone: [phone]   Fax:  [phone]  Name: Adrienne [name] MRN: [id number] Date of Birth: Oct 24, 1934

## 2015-04-30 ENCOUNTER — Ambulatory Visit: Payer: Medicare Other

## 2015-04-30 DIAGNOSIS — M6281 Muscle weakness (generalized): Secondary | ICD-10-CM | POA: Diagnosis not present

## 2015-04-30 DIAGNOSIS — R269 Unspecified abnormalities of gait and mobility: Secondary | ICD-10-CM

## 2015-04-30 DIAGNOSIS — R531 Weakness: Secondary | ICD-10-CM

## 2015-04-30 NOTE — Patient Instructions (Signed)
Cancer Rehab [phone] Bridging    Slowly raise buttocks from floor, keeping stomach tight. Repeat _10___ times per set. Do __1-2__ sets per session. Do __1-2__ sessions per day.  Then hold bridge and open/close knees like a clam 5-10 reps at a time while holding bridge throughout, then also try small alternating march just enough to lift foot from surface 5-10 reps each   http://orth.exer.us/1097   Exercises to do when walking with husband or at counter: 1. Toe walking 2. Heel walking 3. High knee slow marching 4. Side stepping in squat position 5. Practice single leg standing on each leg-can make harder by swinging arms in different directions, arm circles and then eventually closing eyes; then try these with #6 6. Practice heel-toe standing on both legs Copyright  VHI. All rights reserved.

## 2015-04-30 NOTE — Therapy (Signed)
[place], [place], [address] Phone: [phone]   Fax:  [phone]  Physical Therapy Treatment  Patient Details  Name: Adrienne [name] MRN: [id number] Date of Birth: July 17, 1934 Referring Provider: Dr. Truitt Merle  Encounter Date: 04/30/2015      PT End of Session - 04/30/15 1234    Visit Number 9   Number of Visits 9   Date for PT Re-Evaluation 05/04/15   PT Start Time 1022   PT Stop Time 1106   PT Time Calculation (min) 44 min   Activity Tolerance Patient tolerated treatment well   Behavior During Therapy [facility] for tasks assessed/performed      Past Medical History  Diagnosis Date  . Hyperlipidemia   . Hypertension   . History of frequent urinary tract infections   . Breast cancer ([place]) 08/25/12    Invasive ductal ca,DCIS  . Allergy   . Diabetes mellitus     type II  . Hearing loss   . Gout   . Osteopenia   . Hx of radiation therapy 11/22/12- 12/19/12    breast 4250 cGy 17 sessions, left breast boost 750 cGy 3 sessions    Past Surgical History  Procedure Laterality Date  . Appendectomy    . Cholecystectomy    . Breast surgery  1990's    right- fibroid cyst  . Septoplasty    . Breast lumpectomy with needle localization and axillary sentinel lymph node bx Left 09/12/2012    Procedure: LEFT NEEDLE LOCALIZATION BREAST LUMPECTOMY TION AND AXILLARY SENTINEL LYMPH NODE BX;  Surgeon: Edward Jolly, MD;  Location: [place] [facility];  Service: General;  Laterality: Left;  . Abdominal hysterectomy Bilateral 1999    w/b/l salpingo-oopherectomy  . Tonsillectomy      There were no vitals filed for this visit.  Visit Diagnosis:  Decreased strength  Abnormality of gait      Subjective Assessment - 04/30/15 1033    Subjective I feel like I've done really well with therapy and I'm ready to discharge. I have an appt with a lady named Abigail Butts on Friday at the [facility] to discuss the PREP program and when I  start which should be in January.   Currently in Pain? No/denies            [facility] PT Assessment - 04/30/15 0001    Strength   Left Hip Internal Rotation 4/5   Left Hip ABduction 4-/5                     [facility] Adult PT Treatment/Exercise - 04/30/15 0001    Knee/Hip Exercises: Aerobic   Nustep Level 5 x8 minutes   Knee/Hip Exercises: Standing   Other Standing Knee Exercises In // bars: Toe walking, then heel walking 2 sets each, slow high knee marching 4 sets each; practiced single leg standin bil and bil tandem stance and instructed how to progress difficulty of these at home (see education section); bil sidestepping in mini squat position; 6" front step ups 10 reps bil   Knee/Hip Exercises: Supine   Bridges AROM;10 reps;Both   Bridges with Clamshell 10 reps;Both;Strengthening   Straight Leg Raises Strengthening;Left;10 reps   Knee/Hip Exercises: Sidelying   Hip ABduction AROM;Strengthening;Left;10 reps;2 sets   Clams In right S/L for Lt hip 2 sets of 10 reps  [facility] Goals - 04/30/15 1044    CC Long Term Goal  #1   Title Patient will be independent in a home exercise program for LE strengthening and safe self-progression   Status Achieved   CC Long Term Goal  #2   Title Patient will be ready to transition to the PREP program or to independent Y exercise program.   Status Achieved   CC Long Term Goal  #3   Title Left hip abductor strength will be at least 4/5.  Improved to 4-/5 from 3+/5.   Status Partially Met   CC Long Term Goal  #4   Title Patient will report perception of decreased limp and improving strength.   Status Achieved            Plan - 04/30/15 1235    Clinical Impression Statement Pt tolerated treatment well though did report feeling fatigued today just from not sleeping well last night so did less time on NuStep today per her request. Pt has met all but one goal and her strength goal did  improve 1 level, just not the 2 the goal was set for. Pt is ready for discharge and will be starting the PREP program at the [facility] next month.    Pt will benefit from skilled therapeutic intervention in order to improve on the following deficits Abnormal gait;Decreased strength   Rehab Potential Excellent   Clinical Impairments Affecting Rehab Potential none   PT Frequency 2x / week   PT Duration 4 weeks   PT Treatment/Interventions Therapeutic exercise   PT Next Visit Plan Discharge this visit.    PT Home Exercise Plan see education section   Consulted and Agree with Plan of Care Patient        Problem List Patient Active Problem List   Diagnosis Date Noted  . Rapid heart rate 04/01/2015  . Atrial fibrillation ([place]) 04/01/2015  . Diabetes 1.5, managed as type 2 ([place]) 09/04/2014  . Breast cancer, left breast ([place]) 08/30/2012  . Breast cancer ([place]) 08/25/2012  . UTI (urinary tract infection) 08/06/2011  . Influenza 08/04/2011  . Viral URI 06/16/2011  . Gout 06/01/2011  . Vaginitis, atrophic 06/01/2011  . RENAL INSUFFICIENCY, ACUTE 05/28/2010  . HEMATURIA, HX OF 05/28/2010  . Disorder of bone and cartilage 12/08/2007  . Hyperlipidemia 08/10/2007  . Essential hypertension 08/10/2007    Otelia Limes, PTA 04/30/2015, 12:42 PM      PHYSICAL THERAPY DISCHARGE SUMMARY  Visits from Start of Care:9   Current functional level related to goals / functional outcomes: As above    Remaining deficits: Lower extremity weakness   Education / Equipment: Home exercise, follow up at [place]: Patient agrees to discharge.  Patient goals were partially met. Patient is being discharged due to lack of progress.  ?????         Maudry Diego, PT 05/02/2015 5:14 PM     [place], [place], [address] Phone: [phone]   Fax:  [phone]  Name: Adrienne [name] MRN: [id number] Date of  Birth: 1935-02-24

## 2015-04-30 NOTE — Progress Notes (Signed)
Patient called 04-30-2015 at 1147 asking for refill on letrozole.  Instructed to call [facility] [employer] at [facility] as this was sent  And received electronically with visit on 03-26-2015.  She will call [facility].

## 2015-06-10 LAB — CBC
HCT: 45.4 % (ref 36.0–46.0)
Hemoglobin: 14.9 g/dL (ref 12.0–15.0)
MCH: 31.2 pg (ref 26.0–34.0)
MCHC: 32.8 g/dL (ref 30.0–36.0)
MCV: 95 fL (ref 78.0–100.0)
MPV: 12.8 fL — AB (ref 8.6–12.4)
PLATELETS: 205 10*3/uL (ref 150–400)
RBC: 4.78 MIL/uL (ref 3.87–5.11)
RDW: 14.4 % (ref 11.5–15.5)
WBC: 6 10*3/uL (ref 4.0–10.5)

## 2015-06-11 LAB — BASIC METABOLIC PANEL
BUN: 27 mg/dL — ABNORMAL HIGH (ref 7–25)
CALCIUM: 9.6 mg/dL (ref 8.6–10.4)
CHLORIDE: 100 mmol/L (ref 98–110)
CO2: 27 mmol/L (ref 20–31)
CREATININE: 1.05 mg/dL — AB (ref 0.60–0.93)
Glucose, Bld: 125 mg/dL — ABNORMAL HIGH (ref 65–99)
Potassium: 4.3 mmol/L (ref 3.5–5.3)
SODIUM: 137 mmol/L (ref 135–146)

## 2015-06-12 NOTE — Progress Notes (Signed)
HPI: FU atrial fibrillation. At last office visit atenolol was increased and apixaban started. Echo 12/16 showed EF 50, mild biatrial enlargement, mild MR. TSH 11/16. Since last seen, the patient has dyspnea with more extreme activities but not with routine activities. It is relieved with rest. It is not associated with chest pain. There is no orthopnea, PND or pedal edema. There is no syncope or palpitations. There is no exertional chest pain.   Current Outpatient Prescriptions  Medication Sig Dispense Refill  . ACCU-CHEK FASTCLIX LANCETS MISC 1 each by Does not apply route daily. 102 each 3  . allopurinol (ZYLOPRIM) 300 MG tablet Take 1 tablet (300 mg total) by mouth daily. 90 tablet 3  . apixaban (ELIQUIS) 5 MG TABS tablet Take 1 tablet (5 mg total) by mouth 2 (two) times daily. 60 tablet 6  . atenolol (TENORMIN) 50 MG tablet Take 1 tablet (50 mg total) by mouth daily. 90 tablet 3  . Chlorpheniramine Maleate (CHLOR-TABLETS PO) Take 1 tablet by mouth daily.     Marland Kitchen exemestane (AROMASIN) 25 MG tablet Take 1 tablet (25 mg total) by mouth daily. 90 tablet 3  . fish oil-omega-3 fatty acids 1000 MG capsule Take 1 g by mouth daily.    Marland Kitchen glucose blood (ACCU-CHEK AVIVA PLUS) test strip test twice daily 100 each 11  . loratadine (CLARITIN) 10 MG tablet Take 10 mg by mouth daily as needed for allergies.     . metFORMIN (GLUCOPHAGE) 500 MG tablet Take 1 tablet (500 mg total) by mouth 2 (two) times daily. 180 tablet 3  . Multiple Vitamin (MULTIVITAMIN) tablet Take 1 tablet by mouth daily.    Marland Kitchen OVER THE COUNTER MEDICATION Take by mouth 2 (two) times daily. Presavision- for macular degeneration prevention     No current facility-administered medications for this visit.     Past Medical History  Diagnosis Date  . Hyperlipidemia   . Hypertension   . History of frequent urinary tract infections   . Breast cancer ([place]) 08/25/12    Invasive ductal ca,DCIS  . Allergy   . Diabetes mellitus    type II  . Hearing loss   . Gout   . Osteopenia   . Hx of radiation therapy 11/22/12- 12/19/12    breast 4250 cGy 17 sessions, left breast boost 750 cGy 3 sessions    Past Surgical History  Procedure Laterality Date  . Appendectomy    . Cholecystectomy    . Breast surgery  1990's    right- fibroid cyst  . Septoplasty    . Breast lumpectomy with needle localization and axillary sentinel lymph node bx Left 09/12/2012    Procedure: LEFT NEEDLE LOCALIZATION BREAST LUMPECTOMY TION AND AXILLARY SENTINEL LYMPH NODE BX;  Surgeon: Edward Jolly, MD;  Location: [place];  Service: General;  Laterality: Left;  . Abdominal hysterectomy Bilateral 1999    w/b/l salpingo-oopherectomy  . Tonsillectomy      Social History   Social History  . Marital Status: Married    Spouse Name: N/A  . Number of Children: 3  . Years of Education: N/A   Occupational History  . Not on file.   Social History Main Topics  . Smoking status: Former Smoker -- 0.25 packs/day for 4 years    Types: Cigarettes  . Smokeless tobacco: Not on file     Comment: Cigarette use was 50 years ago  . Alcohol Use: Yes     Comment: Occ glass of  wine with dinner  . Drug Use: No  . Sexual Activity: No     Comment: menarche age 21, 54st birth age 64, G29, HRT x 7 years?   Other Topics Concern  . Not on file   Social History Narrative    Family History  Problem Relation Age of Onset  . Hypertension Mother   . Heart disease Mother     Murmur and irregular heart disease  . Hypertension Father   . Cancer Cousin     breast    ROS: no fevers or chills, productive cough, hemoptysis, dysphasia, odynophagia, melena, hematochezia, dysuria, hematuria, rash, seizure activity, orthopnea, PND, pedal edema, claudication. Remaining systems are negative.  Physical Exam: Well-developed well-nourished in no acute distress.  Skin is warm and dry.  HEENT is normal.  Neck is supple.  Chest is clear to auscultation with normal  expansion.  Cardiovascular exam is irregular Abdominal exam nontender or distended. No masses palpated. Extremities show no edema. neuro grossly intact  ECG Atrial fibrillation at a rate of 102. Left ventricular hypertrophy. Nonspecific ST changes.

## 2015-06-17 ENCOUNTER — Other Ambulatory Visit: Payer: Self-pay | Admitting: *Deleted

## 2015-06-17 ENCOUNTER — Ambulatory Visit (INDEPENDENT_AMBULATORY_CARE_PROVIDER_SITE_OTHER): Payer: Medicare Other | Admitting: Cardiology

## 2015-06-17 ENCOUNTER — Telehealth: Payer: Self-pay | Admitting: Hematology

## 2015-06-17 ENCOUNTER — Encounter: Payer: Self-pay | Admitting: Cardiology

## 2015-06-17 VITALS — BP 148/90 | HR 102 | Ht 62.0 in | Wt 143.0 lb

## 2015-06-17 DIAGNOSIS — I480 Paroxysmal atrial fibrillation: Secondary | ICD-10-CM

## 2015-06-17 DIAGNOSIS — I10 Essential (primary) hypertension: Secondary | ICD-10-CM

## 2015-06-17 DIAGNOSIS — I482 Chronic atrial fibrillation: Secondary | ICD-10-CM

## 2015-06-17 DIAGNOSIS — E785 Hyperlipidemia, unspecified: Secondary | ICD-10-CM | POA: Diagnosis not present

## 2015-06-17 DIAGNOSIS — I4821 Permanent atrial fibrillation: Secondary | ICD-10-CM

## 2015-06-17 MED ORDER — ATENOLOL 25 MG PO TABS: 25.0000 mg | ORAL_TABLET | Freq: Every day | ORAL | Status: DC

## 2015-06-17 MED ORDER — APIXABAN 5 MG PO TABS: 5.0000 mg | ORAL_TABLET | Freq: Two times a day (BID) | ORAL | Status: DC

## 2015-06-17 MED ORDER — ATENOLOL 50 MG PO TABS: 50.0000 mg | ORAL_TABLET | Freq: Every day | ORAL | Status: DC

## 2015-06-17 NOTE — Patient Instructions (Signed)
Medication Instructions:   INCREASE ATENOLOL TO 75 MG ONCE DAILY= 1 OF THE 50 MG TABLETS AND 1 OF THE 25 MG TABLETS ONCE DAILY  Testing/Procedures:  Your physician has recommended that you wear a 48 HOUR holter monitor. Holter monitors are medical devices that record the heart's electrical activity. Doctors most often use these monitors to diagnose arrhythmias. Arrhythmias are problems with the speed or rhythm of the heartbeat. The monitor is a small, portable device. You can wear one while you do your normal daily activities. This is usually used to diagnose what is causing palpitations/syncope (passing out).  SCHEDULE IN ONE WEEK AT [facility] [address]  Follow-Up:  Your physician wants you to follow-up in: [place] will receive a reminder letter in the mail two months in advance. If you don't receive a letter, please call our office to schedule the follow-up appointment.   If you need a refill on your cardiac medications before your next appointment, please call your pharmacy.

## 2015-06-17 NOTE — Assessment & Plan Note (Addendum)
Patient remains in atrial fibrillation. She is asymptomatic. Rate is high normal. Increase atenolol to 75 mg daily. Schedule Holter monitor to make sure rate controlled. Plan rate control and anticoagulation. Embolic risk factors include female sex, age greater than 47, diabetes mellitus and hypertension. CHADSvasc 5. Continjue apixaban 5 mg by mouth twice a day. Note previous TSH normal. Ejection fraction 50% on echocardiogram. She will likely need a repeat study in the future.

## 2015-06-17 NOTE — Assessment & Plan Note (Signed)
Blood pressure Elevated. Increase atenolol to 75 mg daily.

## 2015-06-17 NOTE — Telephone Encounter (Signed)
per pof to sch pt appt-cld pt & left a messagfe of time & date of appt-adv to call [facility] to sch mamma-per [facility] doing screening for mamma now

## 2015-06-17 NOTE — Assessment & Plan Note (Signed)
Management per primary care. 

## 2015-06-24 ENCOUNTER — Ambulatory Visit (INDEPENDENT_AMBULATORY_CARE_PROVIDER_SITE_OTHER): Payer: Medicare Other

## 2015-06-24 DIAGNOSIS — I482 Chronic atrial fibrillation: Secondary | ICD-10-CM

## 2015-06-24 DIAGNOSIS — I4821 Permanent atrial fibrillation: Secondary | ICD-10-CM

## 2015-06-27 LAB — HM DIABETES EYE EXAM

## 2015-07-03 ENCOUNTER — Telehealth: Payer: Self-pay | Admitting: *Deleted

## 2015-07-03 DIAGNOSIS — I480 Paroxysmal atrial fibrillation: Secondary | ICD-10-CM

## 2015-07-03 DIAGNOSIS — I4821 Permanent atrial fibrillation: Secondary | ICD-10-CM

## 2015-07-03 MED ORDER — ATENOLOL 100 MG PO TABS: 100.0000 mg | ORAL_TABLET | Freq: Every day | ORAL | Status: DC

## 2015-07-03 NOTE — Telephone Encounter (Signed)
-----   Message from Lelon Perla, MD sent at 07/03/2015  5:29 AM EST ----- Change atenolol to 100 mg daily Kirk Ruths

## 2015-07-03 NOTE — Telephone Encounter (Signed)
Spoke with pt, she voiced understanding of medication increase. New script sent to the pharmacy

## 2015-07-03 NOTE — Telephone Encounter (Signed)
Left message for pt to call.

## 2015-07-03 NOTE — Telephone Encounter (Signed)
Pt rtn call -pls call after 3p at [phone]

## 2015-07-08 ENCOUNTER — Encounter: Payer: Self-pay | Admitting: Family Medicine

## 2015-07-25 ENCOUNTER — Telehealth: Payer: Self-pay | Admitting: Cardiology

## 2015-07-25 NOTE — Telephone Encounter (Signed)
Pt called in stating that Dr. Stanford Breed increased her Atenolol to 100mg  and her BP is still high . She would like to know what to do about this. Please f/u with her

## 2015-07-25 NOTE — Telephone Encounter (Signed)
Spoke with pt, her bp is still running 140/88 at the lowest. She wonders if she can cut the atenolol in 1/2 and take 50 mg in the am and 50 mg in the pm. Okay given for change. She will call if cont to run high

## 2015-08-01 ENCOUNTER — Telehealth: Payer: Self-pay | Admitting: *Deleted

## 2015-08-01 NOTE — Telephone Encounter (Signed)
Ok to wean atenolol (25 mg daily for 3 days and then DC); cardizem CD 180 mg daily to begin when atenolol decreased. Kirk Ruths

## 2015-08-01 NOTE — Telephone Encounter (Signed)
Adrienne [name] is currently in the pts home, we recently increased pts atenolol to 50 mg twice daily. The pt is c/o fatigue and wendy reports her pulse is running 80 to 90 and her bp is still 160/80 and 146/80. The NP would like [name] to consider cardizem for this pt. Will forward for dr Stanford Breed review

## 2015-08-02 MED ORDER — DILTIAZEM HCL ER COATED BEADS 180 MG PO CP24: 180.0000 mg | ORAL_CAPSULE | Freq: Every day | ORAL | Status: DC

## 2015-08-02 NOTE — Telephone Encounter (Signed)
Spoke with pt, Patient voiced understanding of medication changes and she repeated the directions to me. New script sent to the pharmacy

## 2015-08-16 ENCOUNTER — Telehealth: Payer: Self-pay

## 2015-08-16 ENCOUNTER — Ambulatory Visit (INDEPENDENT_AMBULATORY_CARE_PROVIDER_SITE_OTHER): Payer: Medicare Other | Admitting: Family Medicine

## 2015-08-16 VITALS — BP 138/82 | HR 90 | Temp 97.5°F | Resp 17 | Ht 61.25 in | Wt 145.0 lb

## 2015-08-16 DIAGNOSIS — B351 Tinea unguium: Secondary | ICD-10-CM | POA: Diagnosis not present

## 2015-08-16 MED ORDER — MUPIROCIN 2 % EX OINT: 1.0000 "application " | TOPICAL_OINTMENT | Freq: Two times a day (BID) | CUTANEOUS | Status: DC

## 2015-08-16 NOTE — Telephone Encounter (Signed)
Oops! - sorry - is at the [facility] in [place] on [address] now.

## 2015-08-16 NOTE — Patient Instructions (Addendum)
Start Listerine soaks - combined with apple cider vinegar if you wish.  Twice a day.  Dry well after and apply the mupirocin to the red and/or tender areas of the skin around the nail.  After any tenderness resolves, apply vics vaporub after the soaks twice a day.  Over the next several weeks as the nail grows out, this should resolve.  Continue to wear breathable cotton white socks and start an antifungal powder like clotrimazole or a Gold Bond medicated powder to your shoes before the day.    IF you received an x-ray today, you will receive an invoice from [facility]. Please contact [facility] at [phone] with questions or concerns regarding your invoice.   IF you received labwork today, you will receive an invoice from [employer]. Please contact [facility] at [phone] with questions or concerns regarding your invoice.   Our billing staff will not be able to assist you with questions regarding bills from these companies.  You will be contacted with the lab results as soon as they are available. The fastest way to get your results is to activate your My Chart account. Instructions are located on the last page of this paperwork. If you have not heard from [name] regarding the results in 2 weeks, please contact this office.    Can Home Remedies Help? There are also many home remedies that may be helpful at a reasonable cost. They include cornmeal foot soaks, Vicks VapoRub, tea tree oil, oregano oil or Listerine-vinegar foot baths. Tea tree oil and oregano oil have impressive antifungal activity. In fact, oregano oil fights yeast (a type of fungus) better than some antifungal drugs such as clotrimazole, fluconazole and itraconazole (Journal of Applied Microbiology, Aug. 29, 2016). Vicks and Listerine both contain numerous oils derived from plants that appear to fight nail fungus. Vicks was recommended as effective and safe for treating nail fungus in  people with HIV (Journal of the Association of Nurses in [place], Jan-Feb, 2016).  Preventing Toenail Fungus from Recurring   Sanitize your shoes with Mycomist spray or a similar shoe sanitizer spray.  Follow the instructions on the bottle and dry them outside in the sun or with a hairdryer.  We also recommend repeating the sanitization once weekly in shoes you wear most often.   Throw away any shoes you have worn a significant amount without socks-fungus thrives in a warm moist environment and you want to avoid re-infection after your laser procedure   Bleach your socks with regular or color safe bleach   Change your socks regularly to keep your feet clean and dry (especially if you have sweaty feet)-if sweaty feet are a problem, let your doctor know-there is a great lotion that helps with this problem.   Clean your toenail clippers with alcohol before you use them if you do your own toenails and make sure to replace Emory boards and orange sticks regularly   If you get regular pedicures, bring your own instruments or go to a spa that sterilizes their instruments in an autoclave.  Onychomycosis/Fungal Toenails  WHAT IS IT? An infection that lies within the keratin of your nail plate that is caused by a fungus.  WHY ME? Fungal infections affect all ages, sexes, races, and creeds.  There may be many factors that predispose you to a fungal infection such as age, coexisting medical conditions such as diabetes, or an autoimmune disease; stress, medications, fatigue, genetics, etc.  Bottom line: fungus thrives in a  warm, moist environment and your shoes offer such a location.  IS IT CONTAGIOUS? Theoretically, yes.  You do not want to share shoes, nail clippers or files with someone who has fungal toenails.  Walking around barefoot in the same room or sleeping in the same bed is unlikely to transfer the organism.  It is important to realize, however, that fungus can spread easily from one  nail to the next on the same foot.  HOW DO WE TREAT THIS?  There are several ways to treat this condition.  Treatment may depend on many factors such as age, medications, pregnancy, liver and kidney conditions, etc.  It is best to ask your doctor which options are available to you.  2. No treatment.   Unlike many other medical concerns, you can live with this condition.  However for many people this can be a painful condition and may lead to ingrown toenails or a bacterial infection.  It is recommended that you keep the nails cut short to help reduce the amount of fungal nail. 3. Topical treatment.  These range from herbal remedies to prescription strength nail lacquers.  About 40-50% effective, topicals require twice daily application for approximately 9 to 12 months or until an entirely new nail has grown out.  The most effective topicals are medical grade medications available through physicians offices. 4. Oral antifungal medications.  With an 80-90% cure rate, the most common oral medication requires 3 to 4 months of therapy and stays in your system for a year as the new nail grows out.  Oral antifungal medications do require blood work to make sure it is a safe drug for you.  A liver function panel will be performed prior to starting the medication and after the first month of treatment.  It is important to have the blood work performed to avoid any harmful side effects.  In general, this medication safe but blood work is required. 5. Laser Therapy.  This treatment is performed by applying a specialized laser to the affected nail plate.  This therapy is noninvasive, fast, and non-painful.  It is not covered by insurance and is therefore, out of pocket.  The results have been very good with a 80-95% cure rate.  [facility] is the only practice in the area to offer this therapy. 6. Permanent Nail Avulsion.  Removing the entire nail so that a new nail will not grow back.

## 2015-08-16 NOTE — Telephone Encounter (Signed)
Pt saw dr Brigitte Pulse today and was told to get mutirocin over the counter but the pharmacy states it is a rx   Best number [phone]  Pt is at the pharmacy now

## 2015-08-16 NOTE — Progress Notes (Signed)
By signing my name below I, [name], attest that this documentation has been prepared under the direction and in the presence of Delman Cheadle, MD. Electonically Signed. [name], Scribe 08/16/2015 at 11:54 AM  Subjective:    Patient ID: Adrienne [name], female    DOB: 1934-05-29, 81 y.o.   MRN: [id number] Chief Complaint  Patient presents with  . toe injury    right side     HPI Adrienne [name] is a 81 y.o. female who presents to the [facility] complaining of rt toenail issue that she first noticed yesterday. Pt states that her rt big toenail is thickening and appears yellow. Pt states she put neosporin on it last night because she thought it was red around the toenail.  Pt has DM.   Pt is going to [facility] next week with her lifelong friends.  Past Medical History  Diagnosis Date  . Hyperlipidemia   . Hypertension   . History of frequent urinary tract infections   . Breast cancer ([place]) 08/25/12    Invasive ductal ca,DCIS  . Allergy   . Diabetes mellitus     type II  . Hearing loss   . Gout   . Osteopenia   . Hx of radiation therapy 11/22/12- 12/19/12    breast 4250 cGy 17 sessions, left breast boost 750 cGy 3 sessions    Current outpatient prescriptions:  .  ACCU-CHEK FASTCLIX LANCETS MISC, 1 each by Does not apply route daily., Disp: 102 each, Rfl: 3 .  allopurinol (ZYLOPRIM) 300 MG tablet, Take 1 tablet (300 mg total) by mouth daily., Disp: 90 tablet, Rfl: 3 .  apixaban (ELIQUIS) 5 MG TABS tablet, Take 1 tablet (5 mg total) by mouth 2 (two) times daily., Disp: 180 tablet, Rfl: 3 .  Chlorpheniramine Maleate (CHLOR-TABLETS PO), Take 1 tablet by mouth daily. , Disp: , Rfl:  .  diltiazem (CARDIZEM CD) 180 MG 24 hr capsule, Take 1 capsule (180 mg total) by mouth daily., Disp: 90 capsule, Rfl: 3 .  exemestane (AROMASIN) 25 MG tablet, Take 1 tablet (25 mg total) by mouth daily., Disp: 90 tablet, Rfl: 3 .  fish oil-omega-3 fatty acids 1000 MG  capsule, Take 1 g by mouth daily., Disp: , Rfl:  .  glucose blood (ACCU-CHEK AVIVA PLUS) test strip, test twice daily, Disp: 100 each, Rfl: 11 .  loratadine (CLARITIN) 10 MG tablet, Take 10 mg by mouth daily as needed for allergies. , Disp: , Rfl:  .  metFORMIN (GLUCOPHAGE) 500 MG tablet, Take 1 tablet (500 mg total) by mouth 2 (two) times daily., Disp: 180 tablet, Rfl: 3 .  Multiple Vitamin (MULTIVITAMIN) tablet, Take 1 tablet by mouth daily., Disp: , Rfl:  .  OVER THE COUNTER MEDICATION, Take by mouth 2 (two) times daily. Presavision- for macular degeneration prevention, Disp: , Rfl:   Allergies  Allergen Reactions  . Penicillins Hives and Swelling  . Radiaplexrx [Woun'Dres Hydrogel Wound Dress] Hives   Depression screen [facility] 2/9 08/16/2015 04/01/2015 10/24/2013  Decreased Interest 0 0 0  Down, Depressed, Hopeless 0 0 0  PHQ - 2 Score 0 0 0      Review of Systems  Constitutional: Negative for fatigue.  HENT: Negative for congestion.   Eyes: Negative for pain.  Respiratory: Negative for cough.   Cardiovascular: Negative for chest pain.  Gastrointestinal: Negative for abdominal pain.  Genitourinary: Negative for dysuria and flank pain.  Musculoskeletal: Negative for back pain.  Skin:       Positive for yellow, thickening rt 1st toenail.  Neurological: Negative for dizziness and headaches.  Psychiatric/Behavioral: Negative for agitation.       Objective:  BP 138/82 mmHg  Pulse 90  Temp(Src) 97.5 F (36.4 C) (Oral)  Resp 17  Ht 5' 1.25" (1.556 m)  Wt 145 lb (65.772 kg)  BMI 27.17 kg/m2  SpO2 95%  Physical Exam  Constitutional: She is oriented to person, place, and time. She appears well-developed and well-nourished. No distress.  HENT:  Head: Normocephalic and atraumatic.  Eyes: Conjunctivae are normal. Pupils are equal, round, and reactive to light.  Neck: Neck supple.  Cardiovascular: Normal rate.   Pulmonary/Chest: Effort normal.  Musculoskeletal: Normal range of  motion.  Neurological: She is alert and oriented to person, place, and time. Gait normal.  Skin: Skin is warm and dry.  The lateral aspect of her rt big toenail is thickened and yellow and is separating from the toe-bed. The toenail is non-tender.  No significant tinea pedis on either feet bilat.  Psychiatric: She has a normal mood and affect. Her behavior is normal.  Nursing note and vitals reviewed.     Assessment & Plan:   1. Onychomycosis of right great toe   Distal Lateral aspect of right great toe thickened, yellow - seems to be mildly separating from nail bed. Skin at the surrounding ungual fold mildly tender to palpation.  Recommend trial of listerine (+/- vinegar) soaks several times/d followed by gently pushing skin back around nail and apply top mupirocin to prevent ingrown toenail or paronychia. Cut nail straight across. When skin no longer tender, can switch to massaging vics vaporub around nail bid for sev wks. Keep foot dry using antifungal powders. RTC if worsening. All questions answered and pt agreeable to plan.   I personally performed the services described in this documentation, which was scribed in my presence. The recorded information has been reviewed and considered, and addended by me as needed.  Delman Cheadle, MD MPH

## 2015-08-28 ENCOUNTER — Other Ambulatory Visit: Payer: Self-pay | Admitting: Radiology

## 2015-09-03 ENCOUNTER — Encounter: Payer: Self-pay | Admitting: Adult Health

## 2015-09-16 ENCOUNTER — Ambulatory Visit (HOSPITAL_BASED_OUTPATIENT_CLINIC_OR_DEPARTMENT_OTHER): Payer: Medicare Other | Admitting: Hematology

## 2015-09-16 ENCOUNTER — Telehealth: Payer: Self-pay | Admitting: Hematology

## 2015-09-16 ENCOUNTER — Encounter: Payer: Self-pay | Admitting: Hematology

## 2015-09-16 ENCOUNTER — Other Ambulatory Visit (HOSPITAL_BASED_OUTPATIENT_CLINIC_OR_DEPARTMENT_OTHER): Payer: Medicare Other

## 2015-09-16 VITALS — BP 141/88 | HR 69 | Temp 98.1°F | Resp 18 | Ht 61.25 in | Wt 141.6 lb

## 2015-09-16 DIAGNOSIS — M8580 Other specified disorders of bone density and structure, unspecified site: Secondary | ICD-10-CM

## 2015-09-16 DIAGNOSIS — C50912 Malignant neoplasm of unspecified site of left female breast: Secondary | ICD-10-CM

## 2015-09-16 DIAGNOSIS — C50412 Malignant neoplasm of upper-outer quadrant of left female breast: Secondary | ICD-10-CM

## 2015-09-16 DIAGNOSIS — C50512 Malignant neoplasm of lower-outer quadrant of left female breast: Secondary | ICD-10-CM

## 2015-09-16 LAB — CBC WITH DIFFERENTIAL/PLATELET
BASO%: 1 % (ref 0.0–2.0)
BASOS ABS: 0.1 10*3/uL (ref 0.0–0.1)
EOS%: 3 % (ref 0.0–7.0)
Eosinophils Absolute: 0.2 10*3/uL (ref 0.0–0.5)
HCT: 43.2 % (ref 34.8–46.6)
HGB: 14.4 g/dL (ref 11.6–15.9)
LYMPH%: 20.3 % (ref 14.0–49.7)
MCH: 31.3 pg (ref 25.1–34.0)
MCHC: 33.3 g/dL (ref 31.5–36.0)
MCV: 93.9 fL (ref 79.5–101.0)
MONO#: 0.7 10*3/uL (ref 0.1–0.9)
MONO%: 10.5 % (ref 0.0–14.0)
NEUT#: 4.1 10*3/uL (ref 1.5–6.5)
NEUT%: 65.2 % (ref 38.4–76.8)
PLATELETS: 224 10*3/uL (ref 145–400)
RBC: 4.6 10*6/uL (ref 3.70–5.45)
RDW: 14.1 % (ref 11.2–14.5)
WBC: 6.3 10*3/uL (ref 3.9–10.3)
lymph#: 1.3 10*3/uL (ref 0.9–3.3)

## 2015-09-16 LAB — COMPREHENSIVE METABOLIC PANEL
ALT: 32 U/L (ref 0–55)
AST: 28 U/L (ref 5–34)
Albumin: 3.9 g/dL (ref 3.5–5.0)
Alkaline Phosphatase: 98 U/L (ref 40–150)
Anion Gap: 10 mEq/L (ref 3–11)
BUN: 21.2 mg/dL (ref 7.0–26.0)
CHLORIDE: 103 meq/L (ref 98–109)
CO2: 27 meq/L (ref 22–29)
CREATININE: 1.1 mg/dL (ref 0.6–1.1)
Calcium: 10.3 mg/dL (ref 8.4–10.4)
EGFR: 47 mL/min/{1.73_m2} — ABNORMAL LOW (ref >90)
Glucose: 138 mg/dl (ref 70–140)
Potassium: 3.5 mEq/L (ref 3.5–5.1)
Sodium: 140 mEq/L (ref 136–145)
Total Bilirubin: 0.55 mg/dL (ref 0.20–1.20)
Total Protein: 7.2 g/dL (ref 6.4–8.3)

## 2015-09-16 NOTE — Telephone Encounter (Signed)
Gave and printed appt sched and avs fo rpt; for NOV  °

## 2015-09-16 NOTE — Progress Notes (Signed)
[facility] Follow-up office visit  ID: Adrienne [name] OB: 04/27/1935  MR#: [id number]  BJY#:[id number]  PCP: Joycelyn Man, MD  CHIEF COMPLAINT: breast cancer f/u  Oncology History   Breast cancer, left breast   Staging form: Breast, AJCC 7th Edition     Clinical stage from 09/12/2012: Stage IA (T1b, N0, M0) - Unsigned Breast cancer   Staging form: Breast, AJCC 7th Edition     Clinical stage from 09/29/2012: Stage IA (T1, N0, cM0) - Unsigned     Pathologic: No stage assigned - Unsigned       Breast cancer, left breast (HCC)   08/25/2012 Receptors her2 ER 100% positive, PR 88% positive, HER-2 negative, Ki-67 16%   08/30/2012 Initial Diagnosis Breast cancer, left breast   09/12/2012 Pathology Results T1bN0 Grade 1 invasive ductal carcinoma, and DCIS.   09/12/2012 Surgery Left breast lumpectomy and sentinel lymph node biopsy, negative margins.   11/09/2012 - 12/13/2012 Radiation Therapy Adjuvant breast radiation   12/2012 -  Anti-estrogen oral therapy Anastrozole 1 mg daily, switched to Aromasin 25 mg daily in Jan 2015 due to diarrhea.     CURRENT THERAPY: Aromasin daily  INTERVAL HISTORY: Adrienne [name] returns for follow-up. Her routine screening mammogram showed a small lesion in the left breast, she underwent ultrasound-guided biopsy last month, which came back benign. She was quite relieved. She is doing very well overall. She tolerates Aromasin well, no significant side effects, no significant hot flash, mild arthritis is stable. She lives alone, And may move to assisted living soon. Her husband lives in a dementia unit, and she visits him every day. She is expecting her first great grandchildren today.  REVIEW OF SYSTEMS: A 10 point review of systems was conducted and is otherwise negative except for what is noted above.     PAST MEDICAL HISTORY: Past Medical History  Diagnosis Date  . Hyperlipidemia   . Hypertension   . History of frequent urinary tract infections    . Breast cancer ([place]) 08/25/12    Invasive ductal ca,DCIS  . Allergy   . Diabetes mellitus     type II  . Hearing loss   . Gout   . Osteopenia   . Hx of radiation therapy 11/22/12- 12/19/12    breast 4250 cGy 17 sessions, left breast boost 750 cGy 3 sessions    PAST SURGICAL HISTORY: Past Surgical History  Procedure Laterality Date  . Appendectomy    . Cholecystectomy    . Breast surgery  1990's    right- fibroid cyst  . Septoplasty    . Breast lumpectomy with needle localization and axillary sentinel lymph node bx Left 09/12/2012    Procedure: LEFT NEEDLE LOCALIZATION BREAST LUMPECTOMY TION AND AXILLARY SENTINEL LYMPH NODE BX;  Surgeon: Edward Jolly, MD;  Location: [facility];  Service: General;  Laterality: Left;  . Abdominal hysterectomy Bilateral 1999    w/b/l salpingo-oopherectomy  . Tonsillectomy      FAMILY HISTORY Family History  Problem Relation Age of Onset  . Hypertension Mother   . Heart disease Mother     Murmur and irregular heart disease  . Hypertension Father   . Cancer Cousin     breast    GYNECOLOGIC HISTORY: menarche at age 65, g74 p3, s/p TAH/BSO in early 74s.  Was on HRT for several years (cannot recall the duration).  No h/o abnormal pap smears or sexually transmitted infections.    SOCIAL HISTORY: Lives in [place], [place] in a three  story house.  Her husband lives at a nursing home due to dementia.  She is a retired Pharmacist, hospital.  Her son and daughter live in [place].      ADVANCED DIRECTIVES: Not in place.    HEALTH MAINTENANCE: Social History  Substance Use Topics  . Smoking status: Former Smoker -- 0.25 packs/day for 4 years    Types: Cigarettes  . Smokeless tobacco: None     Comment: Cigarette use was 50 years ago  . Alcohol Use: Yes     Comment: Occ glass of wine with dinner    Mammogram: 08/25/2015 (negative)  Colonoscopy: 2011 Bone Density Scan: 03/05/2015 osteopenia  Pap Smear: s/p tah/bso Eye Exam: due Vitamin D Level:  Normal,  04/14/13 Lipid Panel: unsure   Allergies  Allergen Reactions  . Penicillins Hives and Swelling  . Radiaplexrx [Woun'Dres Hydrogel Wound Dress] Hives    Current Outpatient Prescriptions  Medication Sig Dispense Refill  . ACCU-CHEK FASTCLIX LANCETS MISC 1 each by Does not apply route daily. 102 each 3  . allopurinol (ZYLOPRIM) 300 MG tablet Take 1 tablet (300 mg total) by mouth daily. 90 tablet 3  . apixaban (ELIQUIS) 5 MG TABS tablet Take 1 tablet (5 mg total) by mouth 2 (two) times daily. 180 tablet 3  . Chlorpheniramine Maleate (CHLOR-TABLETS PO) Take 1 tablet by mouth daily.     Marland Kitchen diltiazem (CARDIZEM CD) 180 MG 24 hr capsule Take 1 capsule (180 mg total) by mouth daily. 90 capsule 3  . exemestane (AROMASIN) 25 MG tablet Take 1 tablet (25 mg total) by mouth daily. 90 tablet 3  . fish oil-omega-3 fatty acids 1000 MG capsule Take 1 g by mouth daily.    Marland Kitchen glucose blood (ACCU-CHEK AVIVA PLUS) test strip test twice daily 100 each 11  . ketoconazole (NIZORAL) 2 % cream APPLY 1 APPLICATION TO AFFECTED AREA ONCE A DAY EXTERNALLY 14 DAYS AS NEEDED  0  . loratadine (CLARITIN) 10 MG tablet Take 10 mg by mouth daily as needed for allergies.     . metFORMIN (GLUCOPHAGE) 500 MG tablet Take 1 tablet (500 mg total) by mouth 2 (two) times daily. 180 tablet 3  . Multiple Vitamin (MULTIVITAMIN) tablet Take 1 tablet by mouth daily.    . mupirocin ointment (BACTROBAN) 2 % Apply 1 application topically 2 (two) times daily. 30 g 0  . OVER THE COUNTER MEDICATION Take by mouth 2 (two) times daily. Presavision- for macular degeneration prevention     No current facility-administered medications for this visit.    OBJECTIVE: Filed Vitals:   09/16/15 1044  BP: 141/88  Pulse: 69  Temp: 98.1 F (36.7 C)  Resp: 18     Body mass index is 26.53 kg/(m^2).     GENERAL: Patient is a well appearing female in no acute distress HEENT:  Sclerae anicteric.  Oropharynx clear and moist. No ulcerations or evidence of  oropharyngeal candidiasis. Neck is supple.  NODES:  No cervical, supraclavicular, or axillary lymphadenopathy palpated.  BREAST EXAM: left breast s/p lumpectomy, a small mass underneath the recent biopsy site, with resolving skin ecchymosis. No other nodularity or masses, right breast no masses or nodules, benign bilateral breast exam. Bilateral axillary exam was negative for adenopathy. LUNGS:  Clear to auscultation bilaterally.  No wheezes or rhonchi. HEART:  Regular rate and rhythm. No murmur appreciated. ABDOMEN:  Soft, nontender.  Positive, normoactive bowel sounds. No organomegaly palpated. MSK:  No focal spinal tenderness to palpation. Full range of motion bilaterally in  the upper extremities. EXTREMITIES:  No peripheral edema.   SKIN:  Clear with no obvious rashes or skin changes. No nail dyscrasia. NEURO:  Nonfocal. Well oriented.  Appropriate affect. ECOG FS:1 - Symptomatic but completely ambulatory  LAB RESULTS: CBC Latest Ref Rng 09/16/2015 06/10/2015 03/26/2015  WBC 3.9 - 10.3 10e3/uL 6.3 6.0 6.4  Hemoglobin 11.6 - 15.9 g/dL 14.4 14.9 15.3  Hematocrit 34.8 - 46.6 % 43.2 45.4 47.5(H)  Platelets 145 - 400 10e3/uL 224 205 190    CMP Latest Ref Rng 09/16/2015 06/10/2015 03/26/2015  Glucose 70 - 140 mg/dl 138 125(H) 147(H)  BUN 7.0 - 26.0 mg/dL 21.2 27(H) 19.2  Creatinine 0.6 - 1.1 mg/dL 1.1 1.05(H) 1.2(H)  Sodium 136 - 145 mEq/L 140 137 142  Potassium 3.5 - 5.1 mEq/L 3.5 4.3 4.1  Chloride 98 - 110 mmol/L - 100 -  CO2 22 - 29 mEq/L _0 Calcium 8.4 - 10.4 mg/dL 10.3 9.6 10.2  Total Protein 6.4 - 8.3 g/dL 7.2 - 7.1  Total Bilirubin 0.20 - 1.20 mg/dL 0.55 - 0.71  Alkaline Phos 40 - 150 U/L 98 - 106  AST 5 - 34 U/L 28 - 19  ALT 0 - 55 U/L 32 - 17   PATHOLOGY REPORT Diagnosis  08/28/2015 Breast, left, needle core biopsy - FAT NECROSIS. - THERE IS NO EVIDENCE OF MALIGNANCY. - SEE COMMENT.  STUDIES: I have requested her recent mammogram report from [facility].  ASSESSMENT: 81  y.o. [place], [place] woman   1. pT1bN0, stage IA invasive ductal carcinoma of the left breast, grade I, ER 100%, PR 88%, Ki-67 16%, HER-2/neu negative. -she underwent lumpectomy with sentinel lymph node biopsy on 09/12/2012, s/p radiation therapy from 11/09/2012 through 12/19/2012.  -She is clinically doing very well, her physical exam today was unremarkable, except as small lump at the recent the biopsy site, likely hematoma. Her recent left breast biopsy showed fat necrosis. -she is adjuvant Aromasin, tolerating well, we'll continue, for total 5 years, until 11/2017  -She'll continue surveillance with annual mammogram, follow-up with [name] every 6 months -I encouraged her to continue healthy diet and regular exercise.  2. Osteopenia -She is on vitamin D supplement daily. -Giving her worsening hypercalcemia, I told her to stop calcium  -Her recent bone density scan from 03/05/2015 showed osteopenia, with 10 year probability of major ostial parotic fracture 25%, and hip fracture 15%. Giving the high risk of fracture, she would benefit from biphosphonate or Prolia injection. I discussed the benefit and potential side effects, and written material of Prolia was given to her on last visit  -She has decided to start Prolia, no pending dental work, I will schedule first injection in a few weeks.  3. Hypercalcemia -Her calcium was 10.7 in 2016, 10.3 today, since I reduced her calcium supplement -hold calcium now, the need to restart next time after she start Prolia  -She knows to drink adequately and avoid dehydration  -She'll follow up with primary care physician for further workup.   Plan -Continue Aromasin, I refilled for her  -first Prolia injection in a few weeks -I'll see her back in 6 month   I spent 25 minutes counseling the patient face to face.  The total time spent in the appointment was 30 minutes.  Truitt Merle   09/16/2015 11:23 AM

## 2015-09-17 LAB — PTH, INTACT AND CALCIUM
Calcium, Ser: 10.3 mg/dL (ref 8.7–10.3)
PTH, Intact: 26 pg/mL (ref 15–65)

## 2015-09-21 DIAGNOSIS — M8580 Other specified disorders of bone density and structure, unspecified site: Secondary | ICD-10-CM | POA: Insufficient documentation

## 2015-09-24 ENCOUNTER — Telehealth: Payer: Self-pay | Admitting: *Deleted

## 2015-09-24 NOTE — Telephone Encounter (Signed)
Spoke with pt and informed pt that Prolia was approved by her insurance.  Per Charlena Cross, care management,  Medicare will cover 80 % of this med, and pt will be responsible for 20 %.  Above info relayed to pt. Pt agreed and stated she would call office for an appt when pt returns from the beach.

## 2015-10-14 ENCOUNTER — Ambulatory Visit (INDEPENDENT_AMBULATORY_CARE_PROVIDER_SITE_OTHER): Payer: Medicare Other | Admitting: Adult Health

## 2015-10-14 ENCOUNTER — Encounter: Payer: Self-pay | Admitting: Adult Health

## 2015-10-14 VITALS — BP 172/100 | Temp 97.6°F | Ht 61.25 in | Wt 138.2 lb

## 2015-10-14 DIAGNOSIS — I482 Chronic atrial fibrillation: Secondary | ICD-10-CM

## 2015-10-14 DIAGNOSIS — I10 Essential (primary) hypertension: Secondary | ICD-10-CM | POA: Diagnosis not present

## 2015-10-14 DIAGNOSIS — Z7189 Other specified counseling: Secondary | ICD-10-CM

## 2015-10-14 DIAGNOSIS — Z7689 Persons encountering health services in other specified circumstances: Secondary | ICD-10-CM

## 2015-10-14 DIAGNOSIS — I4821 Permanent atrial fibrillation: Secondary | ICD-10-CM

## 2015-10-14 DIAGNOSIS — E139 Other specified diabetes mellitus without complications: Secondary | ICD-10-CM | POA: Diagnosis not present

## 2015-10-14 MED ORDER — LISINOPRIL 10 MG PO TABS: 20.0000 mg | ORAL_TABLET | Freq: Every day | ORAL | Status: DC

## 2015-10-14 NOTE — Patient Instructions (Addendum)
It was great seeing you today!  I have added Lisinopril to your blood pressure routine. Take this daily.   Follow up with me in 2 weeks  Also schedule your physical in November.   Your A1c today is 7.1

## 2015-10-14 NOTE — Progress Notes (Signed)
Patient presents to clinic today to establish care.   Her last physical was in November 2016 with Dr. Sherren Mocha  Acute Concerns: Establish Care    Chronic Issues: Hypertension  - Is not well controlled currently. BP today in the office was 170's/100 on two separate occassions.   Diabetes Type 2 - Controlled  - on Metformin '500mg'$   - Lab Results  Component Value Date   HGBA1C 7.3* 03/25/2015     Health Maintenance: Dental --Twice a year  Vision -- Eye doctor - [facility]  Immunizations -- UTD  Colonoscopy -- 2006  Mammogram --April 2017  Bone Density -- 2014 - Low Bone Mass  Diet: She tries to eat healthy but does not follow a specific diet.  Exercise: She goes to the [facility] three times a week  Is Followed by: Cardiology - [place]    Past Medical History  Diagnosis Date  . Hyperlipidemia   . Hypertension   . History of frequent urinary tract infections   . Breast cancer ([place]) 08/25/12    Invasive ductal ca,DCIS  . Allergy   . Diabetes mellitus     type II  . Hearing loss   . Gout   . Osteopenia   . Hx of radiation therapy 11/22/12- 12/19/12    breast 4250 cGy 17 sessions, left breast boost 750 cGy 3 sessions    Past Surgical History  Procedure Laterality Date  . Appendectomy    . Cholecystectomy    . Breast surgery  1990's    right- fibroid cyst  . Septoplasty    . Breast lumpectomy with needle localization and axillary sentinel lymph node bx Left 09/12/2012    Procedure: LEFT NEEDLE LOCALIZATION BREAST LUMPECTOMY TION AND AXILLARY SENTINEL LYMPH NODE BX;  Surgeon: Edward Jolly, MD;  Location: [facility];  Service: General;  Laterality: Left;  . Abdominal hysterectomy Bilateral 1999    w/b/l salpingo-oopherectomy  . Tonsillectomy      Current Outpatient Prescriptions on File Prior to Visit  Medication Sig Dispense Refill  . ACCU-CHEK FASTCLIX LANCETS MISC 1 each by Does not apply route daily. 102 each 3  . allopurinol  (ZYLOPRIM) 300 MG tablet Take 1 tablet (300 mg total) by mouth daily. 90 tablet 3  . apixaban (ELIQUIS) 5 MG TABS tablet Take 1 tablet (5 mg total) by mouth 2 (two) times daily. 180 tablet 3  . Chlorpheniramine Maleate (CHLOR-TABLETS PO) Take 1 tablet by mouth daily.     Marland Kitchen diltiazem (CARDIZEM CD) 180 MG 24 hr capsule Take 1 capsule (180 mg total) by mouth daily. 90 capsule 3  . exemestane (AROMASIN) 25 MG tablet Take 1 tablet (25 mg total) by mouth daily. 90 tablet 3  . fish oil-omega-3 fatty acids 1000 MG capsule Take 1 g by mouth daily.    Marland Kitchen glucose blood (ACCU-CHEK AVIVA PLUS) test strip test twice daily 100 each 11  . ketoconazole (NIZORAL) 2 % cream APPLY 1 APPLICATION TO AFFECTED AREA ONCE A DAY EXTERNALLY 14 DAYS AS NEEDED  0  . loratadine (CLARITIN) 10 MG tablet Take 10 mg by mouth daily as needed for allergies.     . metFORMIN (GLUCOPHAGE) 500 MG tablet Take 1 tablet (500 mg total) by mouth 2 (two) times daily. 180 tablet 3  . Multiple Vitamin (MULTIVITAMIN) tablet Take 1 tablet by mouth daily.    . mupirocin ointment (BACTROBAN) 2 % Apply 1 application topically 2 (two) times daily. 30 g 0  .  OVER THE COUNTER MEDICATION Take by mouth 2 (two) times daily. Presavision- for macular degeneration prevention     No current facility-administered medications on file prior to visit.    Allergies  Allergen Reactions  . Penicillins Hives and Swelling  . Radiaplexrx [Woun'Dres Hydrogel Wound Dress] Hives    Family History  Problem Relation Age of Onset  . Hypertension Mother   . Heart disease Mother     Murmur and irregular heart disease  . Hypertension Father   . Cancer Cousin     breast    Social History   Social History  . Marital Status: Married    Spouse Name: N/A  . Number of Children: 3  . Years of Education: N/A   Occupational History  . Not on file.   Social History Main Topics  . Smoking status: Former Smoker -- 0.25 packs/day for 4 years    Types: Cigarettes  .  Smokeless tobacco: Not on file     Comment: Cigarette use was 50 years ago  . Alcohol Use: Yes     Comment: Occ glass of wine with dinner  . Drug Use: No  . Sexual Activity: No     Comment: menarche age 47, 76st birth age 34, G4, HRT x 7 years?   Other Topics Concern  . Not on file   Social History Narrative    Review of Systems  Constitutional: Negative.   Respiratory: Negative.   Cardiovascular: Negative.   Gastrointestinal: Negative.   Genitourinary: Negative.   Musculoskeletal: Negative.   Neurological: Negative.     BP Readings from Last 3 Encounters:  10/14/15 172/100  09/16/15 141/88  08/16/15 138/82      There were no vitals taken for this visit.  Physical Exam  Constitutional: She is oriented to person, place, and time and well-developed, well-nourished, and in no distress. No distress.  HENT:  Head: Normocephalic and atraumatic.  Right Ear: External ear normal.  Left Ear: External ear normal.  Nose: Nose normal.  Mouth/Throat: Oropharynx is clear and moist. No oropharyngeal exudate.  Eyes: Conjunctivae and EOM are normal. Pupils are equal, round, and reactive to light. Right eye exhibits no discharge. Left eye exhibits no discharge. No scleral icterus.  Cardiovascular: Normal rate, regular rhythm, normal heart sounds and intact distal pulses.  Exam reveals no gallop and no friction rub.   No murmur heard. Pulmonary/Chest: Effort normal and breath sounds normal. No respiratory distress. She has no wheezes. She has no rales. She exhibits no tenderness.  Musculoskeletal: Normal range of motion.  Neurological: She is alert and oriented to person, place, and time. She displays normal reflexes. No cranial nerve deficit. She exhibits normal muscle tone. Gait normal. Coordination normal. GCS score is 15.  Skin: Skin is warm and dry. No rash noted. She is not diaphoretic. No erythema. No pallor.  Psychiatric: Mood, memory, affect and judgment normal.  Nursing note  and vitals reviewed.   Recent Results (from the past 2160 hour(s))  CBC with Differential     Status: None   Collection Time: 09/16/15 10:02 AM  Result Value Ref Range   WBC 6.3 3.9 - 10.3 10e3/uL   NEUT# 4.1 1.5 - 6.5 10e3/uL   HGB 14.4 11.6 - 15.9 g/dL   HCT 43.2 34.8 - 46.6 %   Platelets 224 145 - 400 10e3/uL   MCV 93.9 79.5 - 101.0 fL   MCH 31.3 25.1 - 34.0 pg   MCHC 33.3 31.5 - 36.0 g/dL  RBC 4.60 3.70 - 5.45 10e6/uL   RDW 14.1 11.2 - 14.5 %   lymph# 1.3 0.9 - 3.3 10e3/uL   MONO# 0.7 0.1 - 0.9 10e3/uL   Eosinophils Absolute 0.2 0.0 - 0.5 10e3/uL   Basophils Absolute 0.1 0.0 - 0.1 10e3/uL   NEUT% 65.2 38.4 - 76.8 %   LYMPH% 20.3 14.0 - 49.7 %   MONO% 10.5 0.0 - 14.0 %   EOS% 3.0 0.0 - 7.0 %   BASO% 1.0 0.0 - 2.0 %  PTH, intact and calcium     Status: None   Collection Time: 09/16/15 10:03 AM  Result Value Ref Range   Calcium, Ser 10.3 8.7 - 10.3 mg/dL   PTH, Intact 26 15 - 65 pg/mL   PTH Comment     Comment:                Interpretation                 Intact PTH    Calcium                                                (pg/mL)      (mg/dL)                Normal                          15 - 65     8.6 - 10.2                Primary Hyperparathyroidism         >65          >10.2                Secondary Hyperparathyroidism       >65          <10.2                Non-Parathyroid Hypercalcemia       <65          >10.2                Hypoparathyroidism                  <15          < 8.6                Non-Parathyroid Hypocalcemia    15 - 65          < 8.6   Comprehensive metabolic panel     Status: Abnormal   Collection Time: 09/16/15 10:03 AM  Result Value Ref Range   Sodium 140 136 - 145 mEq/L   Potassium 3.5 3.5 - 5.1 mEq/L   Chloride 103 98 - 109 mEq/L   CO2 27 22 - 29 mEq/L   Glucose 138 70 - 140 mg/dl    Comment: Glucose reference range is for nonfasting patients. Fasting glucose reference range is 70- 100.   BUN 21.2 7.0 - 26.0 mg/dL   Creatinine 1.1 0.6 -  1.1 mg/dL   Total Bilirubin 0.55 0.20 - 1.20 mg/dL   Alkaline Phosphatase 98 40 - 150 U/L   AST 28 5 - 34 U/L   ALT 32 0 - 55 U/L   Total Protein  7.2 6.4 - 8.3 g/dL   Albumin 3.9 3.5 - 5.0 g/dL   Calcium 10.3 8.4 - 10.4 mg/dL   Anion Gap 10 3 - 11 mEq/L   EGFR 47 (L) >90 ml/min/1.73 m2    Comment: eGFR is calculated using the CKD-EPI Creatinine Equation (2009)    Assessment/Plan:  1. Encounter to establish care - Follow up in November for CPE - Follow up sooner if needed - Continue to exercise and eat healthy   2. Essential hypertension - lisinopril (PRINIVIL,ZESTRIL) 10 MG tablet; Take 2 tablets (20 mg total) by mouth daily.  Dispense: 30 tablet; Refill: 3 - Follow up in two weeks  3. Diabetes 1.5, managed as type 2 ([place]) - Continue with metformin  - POC HgB A1c 7.1  4. Permanent atrial fibrillation [facility]) - followed by cardiology    [name], NP

## 2015-10-17 ENCOUNTER — Telehealth: Payer: Self-pay | Admitting: *Deleted

## 2015-10-17 NOTE — Telephone Encounter (Signed)
Clearance needed for cataract extraction w/interaocular lens implantation of the right eye followed by the left eye. Will forward for dr Stanford Breed review

## 2015-10-18 NOTE — Telephone Encounter (Signed)
Will fax this note to the number provided. 

## 2015-10-18 NOTE — Telephone Encounter (Signed)
Ok for surgery, can hold apixaban 2 d prior to surgery and resume day after if needed Kirk Ruths

## 2015-10-29 ENCOUNTER — Ambulatory Visit: Payer: Medicare Other | Admitting: Adult Health

## 2015-10-30 ENCOUNTER — Ambulatory Visit (INDEPENDENT_AMBULATORY_CARE_PROVIDER_SITE_OTHER): Payer: Medicare Other | Admitting: Adult Health

## 2015-10-30 ENCOUNTER — Encounter: Payer: Self-pay | Admitting: Adult Health

## 2015-10-30 VITALS — BP 152/80 | Temp 98.0°F | Ht 61.25 in | Wt 138.3 lb

## 2015-10-30 DIAGNOSIS — I10 Essential (primary) hypertension: Secondary | ICD-10-CM

## 2015-10-30 MED ORDER — LISINOPRIL 30 MG PO TABS: 30.0000 mg | ORAL_TABLET | Freq: Every day | ORAL | Status: DC

## 2015-10-30 NOTE — Progress Notes (Signed)
Subjective:    Patient ID: Adrienne [name], female    DOB: March 30, 1935, 81 y.o.   MRN: [id number]  HPI  81 year old female who presents to the office today for two week follow up regarding hypertension. In the office two weeks ago her blood pressure was 170/100 on two separate occassions. Lisinopril 20 mg was added to her regimen of cardizem 180 mg.   Today in the office she reports that she feels " fine". She denies any cough, dizziness, lightheadedness. She has not been checking her blood pressure at home due her cuff being broken.   She has no other acute complaints.    Review of Systems  Constitutional: Negative.   Respiratory: Negative.   Cardiovascular: Negative.   Genitourinary: Negative.   Neurological: Negative.    Past Medical History  Diagnosis Date  . Hyperlipidemia   . Hypertension   . History of frequent urinary tract infections   . Breast cancer ([place]) 08/25/12    Invasive ductal ca,DCIS  . Allergy   . Diabetes mellitus     type II  . Hearing loss   . Gout   . Osteopenia   . Hx of radiation therapy 11/22/12- 12/19/12    breast 4250 cGy 17 sessions, left breast boost 750 cGy 3 sessions  . Atrial fibrillation [facility])     Social History   Social History  . Marital Status: Married    Spouse Name: N/A  . Number of Children: 3  . Years of Education: N/A   Occupational History  . Not on file.   Social History Main Topics  . Smoking status: Former Smoker -- 0.25 packs/day for 4 years    Types: Cigarettes  . Smokeless tobacco: Not on file     Comment: Cigarette use was 50 years ago  . Alcohol Use: Yes     Comment: Occ glass of wine with dinner  . Drug Use: No  . Sexual Activity: No     Comment: menarche age 89, 23st birth age 66, G17, HRT x 7 years?   Other Topics Concern  . Not on file   Social History Narrative   Married - husband in memory care unit    Three children ( One lives at [place] and two in [place]      She likes to read and go to  beach     Past Surgical History  Procedure Laterality Date  . Appendectomy    . Cholecystectomy    . Breast surgery  1990's    right- fibroid cyst  . Septoplasty    . Breast lumpectomy with needle localization and axillary sentinel lymph node bx Left 09/12/2012    Procedure: LEFT NEEDLE LOCALIZATION BREAST LUMPECTOMY TION AND AXILLARY SENTINEL LYMPH NODE BX;  Surgeon: Edward Jolly, MD;  Location: [place];  Service: General;  Laterality: Left;  . Abdominal hysterectomy Bilateral 1999    w/b/l salpingo-oopherectomy  . Tonsillectomy      Family History  Problem Relation Age of Onset  . Hypertension Mother   . Heart disease Mother     Murmur and irregular heart disease  . Hypertension Father   . Cancer Cousin     breast    Allergies  Allergen Reactions  . Penicillins Hives and Swelling  . Radiaplexrx [Woun'Dres Hydrogel Wound Dress] Hives    Current Outpatient Prescriptions on File Prior to Visit  Medication Sig Dispense Refill  . ACCU-CHEK FASTCLIX LANCETS MISC 1 each by Does not  apply route daily. 102 each 3  . allopurinol (ZYLOPRIM) 300 MG tablet Take 1 tablet (300 mg total) by mouth daily. 90 tablet 3  . apixaban (ELIQUIS) 5 MG TABS tablet Take 1 tablet (5 mg total) by mouth 2 (two) times daily. 180 tablet 3  . Chlorpheniramine Maleate (CHLOR-TABLETS PO) Take 1 tablet by mouth daily.     Marland Kitchen diltiazem (CARDIZEM CD) 180 MG 24 hr capsule Take 1 capsule (180 mg total) by mouth daily. 90 capsule 3  . exemestane (AROMASIN) 25 MG tablet Take 1 tablet (25 mg total) by mouth daily. 90 tablet 3  . fish oil-omega-3 fatty acids 1000 MG capsule Take 1 g by mouth daily.    Marland Kitchen glucose blood (ACCU-CHEK AVIVA PLUS) test strip test twice daily 100 each 11  . ketoconazole (NIZORAL) 2 % cream APPLY 1 APPLICATION TO AFFECTED AREA ONCE A DAY EXTERNALLY 14 DAYS AS NEEDED  0  . lisinopril (PRINIVIL,ZESTRIL) 10 MG tablet Take 2 tablets (20 mg total) by mouth daily. 30 tablet 3  . loratadine  (CLARITIN) 10 MG tablet Take 10 mg by mouth daily as needed for allergies.     . metFORMIN (GLUCOPHAGE) 500 MG tablet Take 1 tablet (500 mg total) by mouth 2 (two) times daily. 180 tablet 3  . Multiple Vitamin (MULTIVITAMIN) tablet Take 1 tablet by mouth daily.    . mupirocin ointment (BACTROBAN) 2 % Apply 1 application topically 2 (two) times daily. 30 g 0  . OVER THE COUNTER MEDICATION Take by mouth 2 (two) times daily. Presavision- for macular degeneration prevention     No current facility-administered medications on file prior to visit.    BP 152/80 mmHg  Temp(Src) 98 F (36.7 C) (Oral)  Ht 5' 1.25" (1.556 m)  Wt 138 lb 4.8 oz (62.732 kg)  BMI 25.91 kg/m2       Objective:   Physical Exam  Constitutional: She is oriented to person, place, and time. She appears well-developed and well-nourished. No distress.  Cardiovascular: Normal rate, normal heart sounds and intact distal pulses.  An irregularly irregular rhythm present. Exam reveals no gallop.   Pulmonary/Chest: Effort normal and breath sounds normal. No respiratory distress. She has no wheezes. She has no rales. She exhibits no tenderness.  Neurological: She is alert and oriented to person, place, and time.  Skin: Skin is warm and dry. No rash noted. She is not diaphoretic. No erythema. No pallor.  Psychiatric: She has a normal mood and affect. Her behavior is normal. Judgment and thought content normal.  Vitals reviewed.      Assessment & Plan:  1. Essential hypertension - Improved. Will add 10 mg additional lisinopril.  - She was avised of return parameters - lisinopril (PRINIVIL,ZESTRIL) 30 MG tablet; Take 1 tablet (30 mg total) by mouth daily.  Dispense: 90 tablet; Refill: 1 - Will continue to monitor.   [name], NP

## 2015-10-30 NOTE — Patient Instructions (Signed)
It was great seeing you again   Your blood pressure has improved  We will go up to 30 mg on lisinopril   I will see you November 7th for your physical.

## 2015-11-11 ENCOUNTER — Ambulatory Visit: Payer: Medicare Other | Admitting: Family Medicine

## 2015-11-29 ENCOUNTER — Other Ambulatory Visit: Payer: Self-pay | Admitting: Family Medicine

## 2015-11-29 NOTE — Telephone Encounter (Signed)
Rx refill sent to pharmacy. 

## 2015-12-19 ENCOUNTER — Encounter: Payer: Self-pay | Admitting: Cardiology

## 2016-01-02 ENCOUNTER — Ambulatory Visit (INDEPENDENT_AMBULATORY_CARE_PROVIDER_SITE_OTHER): Payer: Medicare Other | Admitting: Cardiology

## 2016-01-02 ENCOUNTER — Encounter: Payer: Self-pay | Admitting: Cardiology

## 2016-01-02 VITALS — BP 134/70 | HR 142 | Ht 63.0 in | Wt 136.0 lb

## 2016-01-02 DIAGNOSIS — I4821 Permanent atrial fibrillation: Secondary | ICD-10-CM

## 2016-01-02 DIAGNOSIS — I482 Chronic atrial fibrillation: Secondary | ICD-10-CM | POA: Diagnosis not present

## 2016-01-02 DIAGNOSIS — E785 Hyperlipidemia, unspecified: Secondary | ICD-10-CM

## 2016-01-02 DIAGNOSIS — I10 Essential (primary) hypertension: Secondary | ICD-10-CM

## 2016-01-02 MED ORDER — DILTIAZEM HCL ER COATED BEADS 360 MG PO CP24: 360.0000 mg | ORAL_CAPSULE | Freq: Every day | ORAL | 3 refills | Status: DC

## 2016-01-02 NOTE — Progress Notes (Signed)
HPI: FU atrial fibrillation. Echo 12/16 showed EF 50, mild biatrial enlargement, mild MR. TSH 11/16-3.67. Holter monitor February 2017 showed atrial fibrillation with PVCs or aberrantly conducted beats. Since last seen, She denies dyspnea, chest pain, palpitations, syncope or bleeding.  Current Outpatient Prescriptions  Medication Sig Dispense Refill  . ACCU-CHEK AVIVA PLUS test strip TEST TWICE DAILY **ICD-10 CODE E13.9** 100 each 4  . ACCU-CHEK FASTCLIX LANCETS MISC 1 each by Does not apply route daily. 102 each 3  . allopurinol (ZYLOPRIM) 300 MG tablet Take 1 tablet (300 mg total) by mouth daily. 90 tablet 3  . apixaban (ELIQUIS) 5 MG TABS tablet Take 1 tablet (5 mg total) by mouth 2 (two) times daily. 180 tablet 3  . Chlorpheniramine Maleate (CHLOR-TABLETS PO) Take 1 tablet by mouth daily.     Marland Kitchen diltiazem (CARDIZEM CD) 180 MG 24 hr capsule Take 1 capsule (180 mg total) by mouth daily. 90 capsule 3  . exemestane (AROMASIN) 25 MG tablet Take 1 tablet (25 mg total) by mouth daily. 90 tablet 3  . fish oil-omega-3 fatty acids 1000 MG capsule Take 1 g by mouth daily.    Marland Kitchen ketoconazole (NIZORAL) 2 % cream APPLY 1 APPLICATION TO AFFECTED AREA ONCE A DAY EXTERNALLY 14 DAYS AS NEEDED  0  . lisinopril (PRINIVIL,ZESTRIL) 10 MG tablet Take 2 tablets (20 mg total) by mouth daily. 30 tablet 3  . lisinopril (PRINIVIL,ZESTRIL) 30 MG tablet Take 1 tablet (30 mg total) by mouth daily. 90 tablet 1  . loratadine (CLARITIN) 10 MG tablet Take 10 mg by mouth daily as needed for allergies.     . metFORMIN (GLUCOPHAGE) 500 MG tablet Take 1 tablet (500 mg total) by mouth 2 (two) times daily. 180 tablet 3  . Multiple Vitamin (MULTIVITAMIN) tablet Take 1 tablet by mouth daily.    . mupirocin ointment (BACTROBAN) 2 % Apply 1 application topically 2 (two) times daily. 30 g 0  . OVER THE COUNTER MEDICATION Take by mouth 2 (two) times daily. Presavision- for macular degeneration prevention     No current  facility-administered medications for this visit.      Past Medical History:  Diagnosis Date  . Allergy   . Atrial fibrillation ([place])   . Breast cancer ([place]) 08/25/12   Invasive ductal ca,DCIS  . Diabetes mellitus    type II  . Gout   . Hearing loss   . History of frequent urinary tract infections   . Hx of radiation therapy 11/22/12- 12/19/12   breast 4250 cGy 17 sessions, left breast boost 750 cGy 3 sessions  . Hyperlipidemia   . Hypertension   . Osteopenia     Past Surgical History:  Procedure Laterality Date  . ABDOMINAL HYSTERECTOMY Bilateral 1999   w/b/l salpingo-oopherectomy  . APPENDECTOMY    . BREAST LUMPECTOMY WITH NEEDLE LOCALIZATION AND AXILLARY SENTINEL LYMPH NODE BX Left 09/12/2012   Procedure: LEFT NEEDLE LOCALIZATION BREAST LUMPECTOMY TION AND AXILLARY SENTINEL LYMPH NODE BX;  Surgeon: Edward Jolly, MD;  Location: [facility];  Service: General;  Laterality: Left;  . BREAST SURGERY  1990's   right- fibroid cyst  . CHOLECYSTECTOMY    . SEPTOPLASTY    . TONSILLECTOMY      Social History   Social History  . Marital status: Married    Spouse name: N/A  . Number of children: 3  . Years of education: N/A   Occupational History  . Not on file.  Social History Main Topics  . Smoking status: Former Smoker    Packs/day: 0.25    Years: 4.00    Types: Cigarettes  . Smokeless tobacco: Never Used     Comment: Cigarette use was 50 years ago  . Alcohol use Yes     Comment: Occ glass of wine with dinner  . Drug use: No  . Sexual activity: No     Comment: menarche age 66, 74st birth age 40, G8, HRT x 7 years?   Other Topics Concern  . Not on file   Social History Narrative   Married - husband in memory care unit    Three children ( One lives at [place] and two in [place]      She likes to read and go to beach     Family History  Problem Relation Age of Onset  . Hypertension Mother   . Heart disease Mother     Murmur and irregular heart  disease  . Hypertension Father   . Cancer Cousin     breast    ROS: no fevers or chills, productive cough, hemoptysis, dysphasia, odynophagia, melena, hematochezia, dysuria, hematuria, rash, seizure activity, orthopnea, PND, pedal edema, claudication. Remaining systems are negative.  Physical Exam: Well-developed well-nourished in no acute distress.  Skin is warm and dry.  HEENT is normal.  Neck is supple.  Chest is clear to auscultation with normal expansion.  Cardiovascular exam is Irregular and tachycardic Abdominal exam nontender or distended. No masses palpated. Extremities show no edema. neuro grossly intact  ECG Atrial fibrillation with PVCs or aberrantly conducted beats. Rate 142. Nonspecific ST changes. Left ventricular hypertrophy.  A/P  1 Permanent atrial fibrillation-patient's heart rate is elevated today. I will discontinue lisinopril. Increase Cardizem from 180-360 mg daily. She will return to see one of our extenders on September 5 to make sure that her rate and blood pressure are controlled. If her heart rate has improved would recommend a 24-hour Holter monitor at that time to make sure that rate is adequate controlled. Continue apixaban.  2 hypertension-given that we are increasing Cardizem for rate control I will discontinue lisinopril. Follow blood pressure and adjust regimen as needed.   3 Hyperlipidemia-management per primary care.  Kirk Ruths, MD

## 2016-01-02 NOTE — Patient Instructions (Addendum)
Medication Instructions:  Your physician has recommended you make the following change in your medication:  1- STOP TAKING YOUR Lisinopril. 2- INCREASE Cardizem to 360 mg  By mouth once a day.    Follow-Up: We request that you follow-up ON NEXT Tuesday January 07, 2016 with an extender and in 3 MONTHS with DR CRENSHAW.  You will receive a reminder letter in the mail two months in advance. If you don't receive a letter, please call our office to schedule the follow-up appointment.   If you need a refill on your cardiac medications before your next appointment, please call your pharmacy.

## 2016-01-07 ENCOUNTER — Encounter: Payer: Self-pay | Admitting: Physician Assistant

## 2016-01-07 ENCOUNTER — Ambulatory Visit (INDEPENDENT_AMBULATORY_CARE_PROVIDER_SITE_OTHER): Payer: Medicare Other | Admitting: Physician Assistant

## 2016-01-07 VITALS — BP 138/72 | HR 119 | Ht 62.5 in | Wt 136.2 lb

## 2016-01-07 DIAGNOSIS — I10 Essential (primary) hypertension: Secondary | ICD-10-CM

## 2016-01-07 DIAGNOSIS — I4821 Permanent atrial fibrillation: Secondary | ICD-10-CM

## 2016-01-07 DIAGNOSIS — I4891 Unspecified atrial fibrillation: Secondary | ICD-10-CM | POA: Diagnosis not present

## 2016-01-07 DIAGNOSIS — Z7901 Long term (current) use of anticoagulants: Secondary | ICD-10-CM | POA: Diagnosis not present

## 2016-01-07 DIAGNOSIS — I482 Chronic atrial fibrillation: Secondary | ICD-10-CM | POA: Diagnosis not present

## 2016-01-07 MED ORDER — METOPROLOL TARTRATE 25 MG PO TABS: 25.0000 mg | ORAL_TABLET | Freq: Two times a day (BID) | ORAL | 3 refills | Status: DC

## 2016-01-07 NOTE — Patient Instructions (Signed)
Medications:  Start Metoprolol 25 mg-- take 1/2 tab twice daily for one week; then take 1 tab twice daily if tolerating well.   Procedures/Testing:  Your physician has recommended that you wear a 48 hour holter monitor. Holter monitors are medical devices that record the heart's electrical activity. Doctors most often use these monitors to diagnose arrhythmias. Arrhythmias are problems with the speed or rhythm of the heartbeat. The monitor is a small, portable device. You can wear one while you do your normal daily activities. This is usually used to diagnose what is causing palpitations/syncope (passing out). To be put on at [address].  Other Instructions:  --Please find your BP cuff to continue recording your blood pressures daily.   Follow-Up:  Your physician recommends that you schedule a follow-up appointment in: 2 weeks with Rosaria Ferries, PA-C or call if you need to be seen sooner.

## 2016-01-07 NOTE — Progress Notes (Signed)
Cardiology Office Note   Date:  01/07/2016   ID:  Adrienne [name], DOB 1934-05-14, MRN [id number]  PCP:  Joycelyn Man, MD  Cardiologist:  Dr Dineen Kid, Suanne Marker, PA-C   No chief complaint on file.   History of Present Illness: Adrienne [name] is a 81 y.o. female with a history of atrial fib, on Eliquis and Cardizem, CHADS2VASC=5 (DM, HTN, age x 2, female), HTN, EF 50% w/ mild biatrial enlargement, mild MR by echo 2016, TSH nl.  08/31, seen in office w/ HR 142, lisinopril d/c'd, Cardizem increased. If HR better, Holter  [name] presents for follow-up of her atrial fibrillation.  She feels that she is asymptomatic from this. She denies any syncope or presyncope. She has no aware of her heart rate doesn't know if it is rapid or irregular. She has to get up out of chairs, etc., but slowly. This is not because she gets dizzy when she stands up, but because she has leg and back issues.  She does not feel that her activity is limited at all. She feels she can do whatever she wants to do without any difficulty at all. She is compliant with the Eliquis and has no bleeding issues.   Past Medical History:  Diagnosis Date  . Allergy   . Atrial fibrillation ([place])   . Breast cancer ([place]) 08/25/12   Invasive ductal ca,DCIS  . Diabetes mellitus    type II  . Gout   . Hearing loss   . History of frequent urinary tract infections   . Hx of radiation therapy 11/22/12- 12/19/12   breast 4250 cGy 17 sessions, left breast boost 750 cGy 3 sessions  . Hyperlipidemia   . Hypertension   . Osteopenia     Past Surgical History:  Procedure Laterality Date  . ABDOMINAL HYSTERECTOMY Bilateral 1999   w/b/l salpingo-oopherectomy  . APPENDECTOMY    . BREAST LUMPECTOMY WITH NEEDLE LOCALIZATION AND AXILLARY SENTINEL LYMPH NODE BX Left 09/12/2012   Procedure: LEFT NEEDLE LOCALIZATION BREAST LUMPECTOMY TION AND AXILLARY SENTINEL LYMPH NODE BX;  Surgeon: Edward Jolly, MD;   Location: [facility];  Service: General;  Laterality: Left;  . BREAST SURGERY  1990's   right- fibroid cyst  . CHOLECYSTECTOMY    . SEPTOPLASTY    . TONSILLECTOMY      Current Outpatient Prescriptions  Medication Sig Dispense Refill  . ACCU-CHEK AVIVA PLUS test strip TEST TWICE DAILY **ICD-10 CODE E13.9** 100 each 4  . ACCU-CHEK FASTCLIX LANCETS MISC 1 each by Does not apply route daily. 102 each 3  . allopurinol (ZYLOPRIM) 300 MG tablet Take 1 tablet (300 mg total) by mouth daily. 90 tablet 3  . apixaban (ELIQUIS) 5 MG TABS tablet Take 1 tablet (5 mg total) by mouth 2 (two) times daily. 180 tablet 3  . Chlorpheniramine Maleate (CHLOR-TABLETS PO) Take 1 tablet by mouth daily.     Marland Kitchen diltiazem (CARDIZEM CD) 360 MG 24 hr capsule Take 1 capsule (360 mg total) by mouth daily. 90 capsule 3  . exemestane (AROMASIN) 25 MG tablet Take 1 tablet (25 mg total) by mouth daily. 90 tablet 3  . fish oil-omega-3 fatty acids 1000 MG capsule Take 1 g by mouth daily.    Marland Kitchen ketoconazole (NIZORAL) 2 % cream APPLY 1 APPLICATION TO AFFECTED AREA ONCE A DAY EXTERNALLY 14 DAYS AS NEEDED  0  . loratadine (CLARITIN) 10 MG tablet Take 10 mg by mouth daily as needed for  allergies.     . metFORMIN (GLUCOPHAGE) 500 MG tablet Take 1 tablet (500 mg total) by mouth 2 (two) times daily. 180 tablet 3  . Multiple Vitamin (MULTIVITAMIN) tablet Take 1 tablet by mouth daily.    . mupirocin ointment (BACTROBAN) 2 % Apply 1 application topically 2 (two) times daily. 30 g 0  . OVER THE COUNTER MEDICATION Take by mouth 2 (two) times daily. Presavision- for macular degeneration prevention     No current facility-administered medications for this visit.     Allergies:   Penicillins and Radiaplexrx [woun'dres hydrogel wound dress]    Social History:  The patient  reports that she has quit smoking. Her smoking use included Cigarettes. She has a 1.00 pack-year smoking history. She has never used smokeless tobacco. She reports that she  drinks alcohol. She reports that she does not use drugs.   Family History:  [name]; [name]; [name]    ROS:  Please see the history of present illness. All other systems are reviewed and negative.    PHYSICAL EXAM: VS:  There were no vitals taken for this visit. , BMI There is no height or weight on file to calculate BMI. GEN: Well nourished, well developed, female in no acute distress  HEENT: normal for age  Neck: no JVD, no carotid bruit, no masses Cardiac:Rapid and Irregular rate and rhythm; no murmur, no rubs, or gallops Respiratory:  clear to auscultation bilaterally, normal work of breathing GI: soft, nontender, nondistended, + BS MS: no deformity or atrophy; no edema; distal pulses are 2+ in all 4 extremities   Skin: warm and dry, no rash Neuro:  Strength and sensation are intact Psych: euthymic mood, full affect   EKG:  EKG is ordered today. The ekg ordered today demonstrates atrial fibrillation, rapid ventricular response with a heart rate of 119   Recent Labs: 03/25/2015: TSH 3.67 09/16/2015: ALT 32; BUN 21.2; Creatinine 1.1; HGB 14.4; Platelets 224; Potassium 3.5; Sodium 140    Lipid Panel    Component Value Date/Time   CHOL 223 (H) 03/25/2015 0756   TRIG 148.0 03/25/2015 0756   TRIG 115 04/08/2006 0000   HDL 45.60 03/25/2015 0756   CHOLHDL 5 03/25/2015 0756   VLDL 29.6 03/25/2015 0756   LDLCALC 148 (H) 03/25/2015 0756   LDLDIRECT 124.0 07/22/2006 0944     Wt Readings from Last 3 Encounters:  01/02/16 136 lb (61.7 kg)  10/30/15 138 lb 4.8 oz (62.7 kg)  10/14/15 138 lb 3.2 oz (62.7 kg)     Other studies Reviewed: Additional studies/ records that were reviewed today include: Office notes and other records.  ASSESSMENT AND PLAN:  1.  Atrial fibrillation, rapid ventricular response: Her Cardizem was doubled last time with a decrease in her heart rate,  but her heart rate is still elevated. We will add a low-dose of metoprolol to her Cardizem, and scheduled the Holter monitor that Dr. Stanford Breed wished to obtain. This will help determine if the metoprolol is enough or she needs additional heart rate control.   Per Dr. Jacalyn Lefevre note, the atrial fibrillation is considered permanent and no attempt will be made to resume sinus rhythm.  2. Chronic anticoagulation: She is tolerating the Eliquis well. No dose change  Current medicines are reviewed at length with the patient today.  The patient does not have concerns regarding medicines.  The following changes have been made:  Add metoprolol  Labs/ tests ordered today include:  No orders of the defined types were placed in this encounter.    Disposition:   FU with Dr Stanford Breed  Signed, Rosaria Ferries, PA-C  01/07/2016 1:34 PM    [facility] Phone: [phone]; Fax: [phone]  This note was written with the assistance of speech recognition software. Please excuse any transcriptional errors.

## 2016-01-14 ENCOUNTER — Ambulatory Visit (INDEPENDENT_AMBULATORY_CARE_PROVIDER_SITE_OTHER): Payer: Medicare Other

## 2016-01-14 DIAGNOSIS — I4891 Unspecified atrial fibrillation: Secondary | ICD-10-CM | POA: Diagnosis not present

## 2016-01-21 ENCOUNTER — Encounter: Payer: Self-pay | Admitting: Physician Assistant

## 2016-01-21 ENCOUNTER — Ambulatory Visit (INDEPENDENT_AMBULATORY_CARE_PROVIDER_SITE_OTHER): Payer: Medicare Other | Admitting: Physician Assistant

## 2016-01-21 VITALS — BP 147/87 | HR 90 | Ht 62.5 in | Wt 138.0 lb

## 2016-01-21 DIAGNOSIS — I481 Persistent atrial fibrillation: Secondary | ICD-10-CM

## 2016-01-21 DIAGNOSIS — I4819 Other persistent atrial fibrillation: Secondary | ICD-10-CM

## 2016-01-21 DIAGNOSIS — Z7901 Long term (current) use of anticoagulants: Secondary | ICD-10-CM | POA: Diagnosis not present

## 2016-01-21 NOTE — Patient Instructions (Signed)
Your physician wants you to follow-up in: 6 months or sooner if needed with Dr Stanford Breed. You will receive a reminder letter in the mail two months in advance. If you don't receive a letter, please call our office to schedule the follow-up appointment.  If you need a refill on your cardiac medications before your next appointment, please call your pharmacy.   We will call you with the monitor results once they become available.

## 2016-01-21 NOTE — Progress Notes (Signed)
Cardiology Office Note   Date:  01/21/2016   ID:  MEEYAH LOBLEY, DOB 1934-12-01, MRN [id number]  PCP:  [name], NP  Cardiologist:  Dr Alcide Evener, PA-C   Chief Complaint  Patient presents with  . Atrial Fibrillation    f/u 48 hr monitor    History of Present Illness: Adrienne [name] is a 81 y.o. female with a history of  atrial fib, on Eliquis and Cardizem, CHADS2VASC=5 (DM, HTN, age x 2, female), HTN, EF 50% w/ mild biatrial enlargement, mild MR by echo 2016, TSH nl.  Seen by [facility] 08/31 w/ rapid afib>early f/u>>seen by me 09/05, HR still elevated, low-dose metop added Holter performed, early f/u  Adrienne [name] presents for evaluation and treatment of atrial fib and f/u of the monitor.   She has not had any palpitations. She never feels the atrial fibrillation and has no idea what her rate runs at any point in time. She has no problems with presyncope or syncope. She has not had lower extremity edema. She denies orthopnea or PND. In general, she is a little tired at times but generally feels well.   Her bowel movements have been looser than usual since she started on the metoprolol. We discussed that she is also on metformin and allopurinol, but she states the stools did not get loose until she was started on a beta blocker. Her blood sugars have also been running higher than usual because she is not in her home.  She struggles with guilt because her husband is in a memory care unit. Additionally, she is under additional stress because they're painting inside and outside of her home and she has not been able to live there for a while. She is staying with her sons in turn. She will be able to get a blood pressure cuff.   Past Medical History:  Diagnosis Date  . Allergy   . Atrial fibrillation ([place])   . Breast cancer ([place]) 08/25/12   Invasive ductal ca,DCIS  . Diabetes mellitus    type II  . Gout   . Hearing loss   . History of frequent urinary tract  infections   . Hx of radiation therapy 11/22/12- 12/19/12   breast 4250 cGy 17 sessions, left breast boost 750 cGy 3 sessions  . Hyperlipidemia   . Hypertension   . Osteopenia     Past Surgical History:  Procedure Laterality Date  . ABDOMINAL HYSTERECTOMY Bilateral 1999   w/b/l salpingo-oopherectomy  . APPENDECTOMY    . BREAST LUMPECTOMY WITH NEEDLE LOCALIZATION AND AXILLARY SENTINEL LYMPH NODE BX Left 09/12/2012   Procedure: LEFT NEEDLE LOCALIZATION BREAST LUMPECTOMY TION AND AXILLARY SENTINEL LYMPH NODE BX;  Surgeon: Edward Jolly, MD;  Location: [facility];  Service: General;  Laterality: Left;  . BREAST SURGERY  1990's   right- fibroid cyst  . CHOLECYSTECTOMY    . SEPTOPLASTY    . TONSILLECTOMY      Current Outpatient Prescriptions  Medication Sig Dispense Refill  . allopurinol (ZYLOPRIM) 300 MG tablet Take 1 tablet (300 mg total) by mouth daily. 90 tablet 3  . apixaban (ELIQUIS) 5 MG TABS tablet Take 1 tablet (5 mg total) by mouth 2 (two) times daily. 180 tablet 3  . diltiazem (CARDIZEM CD) 360 MG 24 hr capsule Take 1 capsule (360 mg total) by mouth daily. 90 capsule 3  . exemestane (AROMASIN) 25 MG tablet Take 1 tablet (25 mg total) by mouth daily. Nellie  tablet 3  . fish oil-omega-3 fatty acids 1000 MG capsule Take 1 g by mouth daily.    Marland Kitchen ketoconazole (NIZORAL) 2 % cream APPLY 1 APPLICATION TO AFFECTED AREA ONCE A DAY EXTERNALLY 14 DAYS AS NEEDED  0  . metFORMIN (GLUCOPHAGE) 500 MG tablet Take 1 tablet (500 mg total) by mouth 2 (two) times daily. 180 tablet 3  . metoprolol tartrate (LOPRESSOR) 25 MG tablet Take 1 tablet (25 mg total) by mouth 2 (two) times daily. 180 tablet 3  . Multiple Vitamin (MULTIVITAMIN) tablet Take 1 tablet by mouth daily.    . mupirocin ointment (BACTROBAN) 2 % Apply 1 application topically 2 (two) times daily. 30 g 0  . OVER THE COUNTER MEDICATION Take by mouth 2 (two) times daily. Presavision- for macular degeneration prevention     No current  facility-administered medications for this visit.     Allergies:   Penicillins and Radiaplexrx [woun'dres hydrogel wound dress]    Social History:  The patient  reports that she has quit smoking. Her smoking use included Cigarettes. She has a 1.00 pack-year smoking history. She has never used smokeless tobacco. She reports that she drinks alcohol. She reports that she does not use drugs.   Family History:  [name]; [name]; [name]    ROS:  Please see the history of present illness. All other systems are reviewed and negative.    PHYSICAL EXAM: VS:  BP (!) 147/87   Pulse 90   Ht 5' 2.5" (1.588 m)   Wt 138 lb (62.6 kg)   BMI 24.84 kg/m  , BMI Body mass index is 24.84 kg/m. GEN: Well nourished, well developed, female in no acute distress  HEENT: normal for age  Neck: no JVD, no carotid bruit, no masses Cardiac: Irregular rate and rhythm; no murmur, no rubs, or gallops Respiratory:  Increased breath sounds bases bilaterally, normal work of breathing GI: soft, nontender, nondistended, + BS MS: no deformity or atrophy; no edema; distal pulses are 2+ in all 4 extremities   Skin: warm and dry, no rash Neuro:  Strength and sensation are intact Psych: euthymic mood, full affect   EKG:  EKG is ordered today. The ekg ordered today demonstrates atrial fibrillation, rate 90   Recent Labs: 03/25/2015: TSH 3.67 09/16/2015: ALT 32; BUN 21.2; Creatinine 1.1; HGB 14.4; Platelets 224; Potassium 3.5; Sodium 140    Lipid Panel    Component Value Date/Time   CHOL 223 (H) 03/25/2015 0756   TRIG 148.0 03/25/2015 0756   TRIG 115 04/08/2006 0000   HDL 45.60 03/25/2015 0756   CHOLHDL 5 03/25/2015 0756   VLDL 29.6 03/25/2015 0756   LDLCALC 148 (H) 03/25/2015 0756   LDLDIRECT 124.0 07/22/2006 0944     Wt Readings from Last 3 Encounters:  01/21/16 138 lb (62.6 kg)  01/07/16 136 lb 3.2 oz  (61.8 kg)  01/02/16 136 lb (61.7 kg)     Other studies Reviewed: Additional studies/ records that were reviewed today include: Office notes and other records..  ASSESSMENT AND PLAN:  1.  Persistent atrial fibrillation: Her rate is under better control. Her blood pressure is stable. No medication changes. We discussed if she would like to decrease the metoprolol dose, but she elects to keep it where it is because her heart rate is better controlled. She is can obtain a blood pressure cuff and check her blood pressure/heart rate anytime  she feels bad.  The Holter monitor results were called for, the company will download the information sent to [name] as quickly as possible. Her graft   2. Anticoagulation:  CHADS2VASC=5 (DM, HTN, age x 2, female), she is tolerating the Eliquis well.   Current medicines are reviewed at length with the patient today.  The patient has concerns regarding medicines. Concerns were addressed  The following changes have been made:  no change  Labs/ tests ordered today include:   Orders Placed This Encounter  Procedures  . EKG 12-Lead     Disposition:   FU with Dr. Stanford Breed  Signed, Lenoard Aden  01/21/2016 12:37 PM    [place] Phone: [phone]; Fax: [phone]  This note was written with the assistance of speech recognition software. Please excuse any transcriptional errors.

## 2016-02-03 ENCOUNTER — Telehealth: Payer: Self-pay | Admitting: Cardiology

## 2016-02-03 MED ORDER — ATENOLOL 25 MG PO TABS: 25.0000 mg | ORAL_TABLET | Freq: Every day | ORAL | 12 refills | Status: DC

## 2016-02-03 NOTE — Telephone Encounter (Signed)
Spoke with pt, she reports having trouble with diarrhea off and on since starting the metoprolol a month ago. She reports it has gotten worse and would like to change to a different medication. She currently is taking lopressor 25 mg bid. Will forward for dr Stanford Breed review

## 2016-02-03 NOTE — Telephone Encounter (Signed)
Pt says her Metoprolol is giving her diarrhea. She wants to know if there is something else she can take?

## 2016-02-03 NOTE — Telephone Encounter (Signed)
Dc metoprolol; atenolol 25 mg daily Kirk Ruths

## 2016-02-03 NOTE — Telephone Encounter (Signed)
Left message for pt of medication change. New script sent to the pharmacy

## 2016-02-27 ENCOUNTER — Encounter: Payer: Self-pay | Admitting: Adult Health

## 2016-02-27 LAB — HM MAMMOGRAPHY

## 2016-02-28 ENCOUNTER — Telehealth: Payer: Self-pay | Admitting: *Deleted

## 2016-02-28 NOTE — Telephone Encounter (Signed)
Pt states she left a message 2 days ago with scheduling. Needs to reschedule appt from 03/17/16. Will be out of town until 03/21/16. Can come week of 03/23/16.

## 2016-02-28 NOTE — Telephone Encounter (Signed)
Please reschedule per pt's request, thanks   Truitt Merle MD

## 2016-03-03 ENCOUNTER — Encounter: Payer: Self-pay | Admitting: Adult Health

## 2016-03-10 ENCOUNTER — Ambulatory Visit (INDEPENDENT_AMBULATORY_CARE_PROVIDER_SITE_OTHER): Payer: Medicare Other | Admitting: Adult Health

## 2016-03-10 ENCOUNTER — Encounter: Payer: Self-pay | Admitting: Adult Health

## 2016-03-10 VITALS — BP 150/90 | HR 72 | Temp 97.8°F | Resp 18 | Ht 61.5 in | Wt 137.5 lb

## 2016-03-10 DIAGNOSIS — M8580 Other specified disorders of bone density and structure, unspecified site: Secondary | ICD-10-CM

## 2016-03-10 DIAGNOSIS — I482 Chronic atrial fibrillation: Secondary | ICD-10-CM

## 2016-03-10 DIAGNOSIS — E139 Other specified diabetes mellitus without complications: Secondary | ICD-10-CM

## 2016-03-10 DIAGNOSIS — I10 Essential (primary) hypertension: Secondary | ICD-10-CM

## 2016-03-10 DIAGNOSIS — E109 Type 1 diabetes mellitus without complications: Secondary | ICD-10-CM | POA: Diagnosis not present

## 2016-03-10 DIAGNOSIS — Z0000 Encounter for general adult medical examination without abnormal findings: Secondary | ICD-10-CM | POA: Diagnosis not present

## 2016-03-10 DIAGNOSIS — E785 Hyperlipidemia, unspecified: Secondary | ICD-10-CM | POA: Diagnosis not present

## 2016-03-10 DIAGNOSIS — I4821 Permanent atrial fibrillation: Secondary | ICD-10-CM

## 2016-03-10 LAB — CBC WITH DIFFERENTIAL/PLATELET
Basophils Absolute: 0 10*3/uL (ref 0.0–0.1)
Basophils Relative: 0.6 % (ref 0.0–3.0)
EOS PCT: 1.7 % (ref 0.0–5.0)
Eosinophils Absolute: 0.1 10*3/uL (ref 0.0–0.7)
HEMATOCRIT: 43.8 % (ref 36.0–46.0)
HEMOGLOBIN: 14.5 g/dL (ref 12.0–15.0)
LYMPHS PCT: 18.5 % (ref 12.0–46.0)
Lymphs Abs: 1.4 10*3/uL (ref 0.7–4.0)
MCHC: 33 g/dL (ref 30.0–36.0)
MCV: 95.7 fl (ref 78.0–100.0)
MONOS PCT: 8.6 % (ref 3.0–12.0)
Monocytes Absolute: 0.6 10*3/uL (ref 0.1–1.0)
Neutro Abs: 5.2 10*3/uL (ref 1.4–7.7)
Neutrophils Relative %: 70.6 % (ref 43.0–77.0)
Platelets: 183 10*3/uL (ref 150.0–400.0)
RBC: 4.58 Mil/uL (ref 3.87–5.11)
RDW: 14.7 % (ref 11.5–15.5)
WBC: 7.3 10*3/uL (ref 4.0–10.5)

## 2016-03-10 LAB — LIPID PANEL
CHOL/HDL RATIO: 4
Cholesterol: 225 mg/dL — ABNORMAL HIGH (ref 0–200)
HDL: 53.3 mg/dL (ref >39.00)
LDL Cholesterol: 146 mg/dL — ABNORMAL HIGH (ref 0–99)
NONHDL: 171.27
Triglycerides: 125 mg/dL (ref 0.0–149.0)
VLDL: 25 mg/dL (ref 0.0–40.0)

## 2016-03-10 LAB — POC URINALSYSI DIPSTICK (AUTOMATED)
Bilirubin, UA: NEGATIVE
Glucose, UA: NEGATIVE
KETONES UA: NEGATIVE
NITRITE UA: NEGATIVE
PH UA: 5.5
Spec Grav, UA: 1.015
Urobilinogen, UA: 0.2

## 2016-03-10 LAB — HEPATIC FUNCTION PANEL
ALBUMIN: 4.4 g/dL (ref 3.5–5.2)
ALK PHOS: 87 U/L (ref 39–117)
ALT: 21 U/L (ref 0–35)
AST: 19 U/L (ref 0–37)
BILIRUBIN DIRECT: 0.1 mg/dL (ref 0.0–0.3)
TOTAL PROTEIN: 6.9 g/dL (ref 6.0–8.3)
Total Bilirubin: 0.7 mg/dL (ref 0.2–1.2)

## 2016-03-10 LAB — HEMOGLOBIN A1C: Hgb A1c MFr Bld: 7 % — ABNORMAL HIGH (ref 4.6–6.5)

## 2016-03-10 LAB — VITAMIN D 25 HYDROXY (VIT D DEFICIENCY, FRACTURES): VITD: 39.21 ng/mL (ref 30.00–100.00)

## 2016-03-10 LAB — BASIC METABOLIC PANEL
BUN: 23 mg/dL (ref 6–23)
CO2: 28 mEq/L (ref 19–32)
Calcium: 9.9 mg/dL (ref 8.4–10.5)
Chloride: 104 mEq/L (ref 96–112)
Creatinine, Ser: 1.23 mg/dL — ABNORMAL HIGH (ref 0.40–1.20)
GFR: 44.75 mL/min — AB (ref >60.00)
GLUCOSE: 143 mg/dL — AB (ref 70–99)
POTASSIUM: 4.2 meq/L (ref 3.5–5.1)
SODIUM: 141 meq/L (ref 135–145)

## 2016-03-10 LAB — TSH: TSH: 2.18 u[IU]/mL (ref 0.35–4.50)

## 2016-03-10 NOTE — Progress Notes (Signed)
Subjective:    Patient ID: Adrienne [name], female    DOB: 06/14/1934, 81 y.o.   MRN: [id number]  HPI  Patient presents for yearly preventative medicine examination. She is a pleasant 81 year old female who  has a past medical history of Allergy; Atrial fibrillation ([place]); Breast cancer ([place]) (08/25/12); Diabetes mellitus; Gout; Hearing loss; History of frequent urinary tract infections; radiation therapy (11/22/12- 12/19/12); Hyperlipidemia; Hypertension; and Osteopenia.  She sees Dr. Stanford Breed with cardiology due to A fib. He has recently increased Diltiazem. Lisinopril was stopped. She takes Atenolol 25mg  and Eliquis 5 mg  She takes Metformin 500mg  BID for DM II  All immunizations and health maintenance protocols were reviewed with the patient and needed orders were placed. She is up to date on her vaccinations.   Appropriate screening laboratory values were ordered for the patient including screening of hyperlipidemia, renal function and hepatic function.  Medication reconciliation,  past medical history, social history, problem list and allergies were reviewed in detail with the patient  Goals were established with regard to weight loss, exercise, and  diet in compliance with medications. She likes to go to the [facility] three times a week. She does try to eat healthy but does not follow a specific diet. She is not eating as healthy as she would like due to living in multiple locations.   End of life planning was discussed.  She gets routine eye care, dental care, BSE monthly, annual mammography, colon cancer previously was normal therefore it her age not repeated. On her most recent mammogram it was noticed that she had multiple small masses in her left breast. These are all thought to be benign in nature. She will have a follow up exam done in 6 months   She has informed me that she will be moving out of her house and into a retirement facility. She is redoing her home to get it ready to  sell. She has also been living at her sons during the week and daughters on the weekends. All of this has provided great stress on Adrienne [name]. She also continues to feel guilt about her husband being in a memory care unit.     Review of Systems  Constitutional: Negative.   HENT: Negative.   Eyes: Negative.   Respiratory: Negative.   Cardiovascular: Negative.   Gastrointestinal: Negative.   Endocrine: Negative.   Genitourinary: Negative.   Musculoskeletal: Negative.   Skin: Negative.   Allergic/Immunologic: Negative.   Neurological: Negative.   Hematological: Negative.    Past Medical History:  Diagnosis Date  . Allergy   . Atrial fibrillation ([place])   . Breast cancer ([place]) 08/25/12   Invasive ductal ca,DCIS  . Diabetes mellitus    type II  . Gout   . Hearing loss   . History of frequent urinary tract infections   . Hx of radiation therapy 11/22/12- 12/19/12   breast 4250 cGy 17 sessions, left breast boost 750 cGy 3 sessions  . Hyperlipidemia   . Hypertension   . Osteopenia     Social History   Social History  . Marital status: Married    Spouse name: N/A  . Number of children: 3  . Years of education: N/A   Occupational History  . Not on file.   Social History Main Topics  . Smoking status: Former Smoker    Packs/day: 0.25    Years: 4.00    Types: Cigarettes  . Smokeless tobacco: Never Used  Comment: Cigarette use was 50 years ago  . Alcohol use Yes     Comment: Occ glass of wine with dinner  . Drug use: No  . Sexual activity: No     Comment: menarche age 27, 41st birth age 43, G37, HRT x 7 years?   Other Topics Concern  . Not on file   Social History Narrative   Married - husband in memory care unit    Three children ( One lives at [place] and two in [place]      She likes to read and go to beach     Past Surgical History:  Procedure Laterality Date  . ABDOMINAL HYSTERECTOMY Bilateral 1999   w/b/l salpingo-oopherectomy  . APPENDECTOMY      . BREAST LUMPECTOMY WITH NEEDLE LOCALIZATION AND AXILLARY SENTINEL LYMPH NODE BX Left 09/12/2012   Procedure: LEFT NEEDLE LOCALIZATION BREAST LUMPECTOMY TION AND AXILLARY SENTINEL LYMPH NODE BX;  Surgeon: Edward Jolly, MD;  Location: [place];  Service: General;  Laterality: Left;  . BREAST SURGERY  1990's   right- fibroid cyst  . CHOLECYSTECTOMY    . SEPTOPLASTY    . TONSILLECTOMY      Family History  Problem Relation Age of Onset  . Hypertension Mother   . Heart disease Mother     Murmur and irregular heart disease  . Hypertension Father   . Cancer Cousin     breast    Allergies  Allergen Reactions  . Penicillins Hives and Swelling  . Radiaplexrx [Woun'Dres Hydrogel Wound Dress] Hives    Current Outpatient Prescriptions on File Prior to Visit  Medication Sig Dispense Refill  . allopurinol (ZYLOPRIM) 300 MG tablet Take 1 tablet (300 mg total) by mouth daily. 90 tablet 3  . apixaban (ELIQUIS) 5 MG TABS tablet Take 1 tablet (5 mg total) by mouth 2 (two) times daily. 180 tablet 3  . atenolol (TENORMIN) 25 MG tablet Take 1 tablet (25 mg total) by mouth daily. 30 tablet 12  . diltiazem (CARDIZEM CD) 360 MG 24 hr capsule Take 1 capsule (360 mg total) by mouth daily. 90 capsule 3  . exemestane (AROMASIN) 25 MG tablet Take 1 tablet (25 mg total) by mouth daily. 90 tablet 3  . fish oil-omega-3 fatty acids 1000 MG capsule Take 1 g by mouth daily.    Marland Kitchen ketoconazole (NIZORAL) 2 % cream APPLY 1 APPLICATION TO AFFECTED AREA ONCE A DAY EXTERNALLY 14 DAYS AS NEEDED  0  . metFORMIN (GLUCOPHAGE) 500 MG tablet Take 1 tablet (500 mg total) by mouth 2 (two) times daily. 180 tablet 3  . Multiple Vitamin (MULTIVITAMIN) tablet Take 1 tablet by mouth daily.    . mupirocin ointment (BACTROBAN) 2 % Apply 1 application topically 2 (two) times daily. 30 g 0  . OVER THE COUNTER MEDICATION Take by mouth 2 (two) times daily. Presavision- for macular degeneration prevention     No current  facility-administered medications on file prior to visit.     BP (!) 150/90 (BP Location: Left Arm, Patient Position: Sitting, Cuff Size: Normal)   Pulse 72   Temp 97.8 F (36.6 C) (Oral)   Resp 18   Ht 5' 1.5" (1.562 m)   Wt 137 lb 8 oz (62.4 kg)   SpO2 96%   BMI 25.56 kg/m       Objective:   Physical Exam  Constitutional: She is oriented to person, place, and time. She appears well-developed and well-nourished. No distress.  HENT:  Head:  Normocephalic and atraumatic.  Right Ear: External ear normal.  Left Ear: External ear normal.  Nose: Nose normal.  Mouth/Throat: Oropharynx is clear and moist. No oropharyngeal exudate.  Eyes: Conjunctivae are normal. Right eye exhibits no discharge. Left eye exhibits no discharge.  Neck: Normal range of motion. Neck supple. No JVD present. No tracheal deviation present. No thyromegaly present.  Cardiovascular: Normal rate, normal heart sounds and intact distal pulses.  An irregularly irregular rhythm present. Exam reveals no gallop and no friction rub.   No murmur heard. Pulmonary/Chest: Effort normal and breath sounds normal. No stridor. No respiratory distress. She has no wheezes. She has no rales. She exhibits no tenderness.  Abdominal: Soft. Bowel sounds are normal. She exhibits no distension and no mass. There is no tenderness. There is no rebound and no guarding.  Genitourinary:  Genitourinary Comments: Breast exam: No dimpling, discharge or asymmetry. Small mass felt at 12 o clock anterior.   Musculoskeletal: Normal range of motion. She exhibits no edema, tenderness or deformity.  Lymphadenopathy:    She has no cervical adenopathy.  Neurological: She is alert and oriented to person, place, and time. She has normal reflexes. She displays normal reflexes. No cranial nerve deficit. She exhibits normal muscle tone. Coordination normal.  Skin: Skin is warm and dry. No rash noted. No erythema. No pallor.  Psychiatric: She has a normal mood  and affect. Her behavior is normal. Judgment and thought content normal.  Nursing note and vitals reviewed.      Assessment & Plan:  1. Routine general medical examination at a health care facility - Continue to exercise and eat healthy.  - Basic metabolic panel - CBC with Differential/Platelet - Hemoglobin A1c - Hepatic function panel - Lipid panel - POCT Urinalysis Dipstick (Automated) - TSH  2. Essential hypertension - Not at goal today. No change in medications at this time - Basic metabolic panel - CBC with Differential/Platelet - Hemoglobin A1c - Hepatic function panel - Lipid panel - POCT Urinalysis Dipstick (Automated) - TSH  3. Permanent atrial fibrillation (HCC) - Continue with current plan implemented by Cardiology. Follow up with Dr. Stanford Breed in December.   4. Diabetes 1.5, managed as type 2 ([place]) - Basic metabolic panel - CBC with Differential/Platelet - Hemoglobin A1c - Hepatic function panel - Lipid panel - POCT Urinalysis Dipstick (Automated) - TSH - Vitamin D, 25-hydroxy - Consider increasing Metformin  - Follow up in 6 months  5. Hyperlipidemia, unspecified hyperlipidemia type - Basic metabolic panel - CBC with Differential/Platelet - Hemoglobin A1c - Hepatic function panel - Lipid panel - POCT Urinalysis Dipstick (Automated) - TSH - Vitamin D, 25-hydroxy - She is taking fish oil  6. Osteopenia, unspecified location  - Vitamin D, 25-hydroxy  [name], NP

## 2016-03-10 NOTE — Patient Instructions (Signed)
It was great seeing you today!   I will follow up with you regarding your blood work that we had done today.   Please follow up with me in 6 months for a diabetes check. Of course, if you need anything in the meantime, please let me know  I hope your move goes well and you get settled quickly.

## 2016-03-17 ENCOUNTER — Ambulatory Visit: Payer: Medicare Other | Admitting: Hematology

## 2016-03-17 ENCOUNTER — Other Ambulatory Visit: Payer: Medicare Other

## 2016-03-24 ENCOUNTER — Encounter: Payer: Self-pay | Admitting: Hematology

## 2016-03-24 ENCOUNTER — Ambulatory Visit (HOSPITAL_BASED_OUTPATIENT_CLINIC_OR_DEPARTMENT_OTHER): Payer: Medicare Other | Admitting: Hematology

## 2016-03-24 ENCOUNTER — Other Ambulatory Visit (HOSPITAL_BASED_OUTPATIENT_CLINIC_OR_DEPARTMENT_OTHER): Payer: Medicare Other

## 2016-03-24 ENCOUNTER — Telehealth: Payer: Self-pay | Admitting: Hematology

## 2016-03-24 VITALS — BP 140/65 | HR 78 | Temp 97.9°F | Resp 18 | Ht 61.5 in | Wt 138.6 lb

## 2016-03-24 DIAGNOSIS — I4891 Unspecified atrial fibrillation: Secondary | ICD-10-CM | POA: Diagnosis not present

## 2016-03-24 DIAGNOSIS — M8580 Other specified disorders of bone density and structure, unspecified site: Secondary | ICD-10-CM

## 2016-03-24 DIAGNOSIS — Z170 Estrogen receptor positive status [ER+]: Secondary | ICD-10-CM | POA: Diagnosis not present

## 2016-03-24 DIAGNOSIS — C50912 Malignant neoplasm of unspecified site of left female breast: Secondary | ICD-10-CM | POA: Diagnosis not present

## 2016-03-24 DIAGNOSIS — C50412 Malignant neoplasm of upper-outer quadrant of left female breast: Secondary | ICD-10-CM

## 2016-03-24 DIAGNOSIS — I10 Essential (primary) hypertension: Secondary | ICD-10-CM

## 2016-03-24 LAB — CBC WITH DIFFERENTIAL/PLATELET
BASO%: 0.6 % (ref 0.0–2.0)
BASOS ABS: 0 10*3/uL (ref 0.0–0.1)
EOS ABS: 0.1 10*3/uL (ref 0.0–0.5)
EOS%: 1.7 % (ref 0.0–7.0)
HEMATOCRIT: 43 % (ref 34.8–46.6)
HEMOGLOBIN: 14.3 g/dL (ref 11.6–15.9)
LYMPH#: 2 10*3/uL (ref 0.9–3.3)
LYMPH%: 28.5 % (ref 14.0–49.7)
MCH: 31.6 pg (ref 25.1–34.0)
MCHC: 33.3 g/dL (ref 31.5–36.0)
MCV: 94.9 fL (ref 79.5–101.0)
MONO#: 0.7 10*3/uL (ref 0.1–0.9)
MONO%: 9.3 % (ref 0.0–14.0)
NEUT#: 4.2 10*3/uL (ref 1.5–6.5)
NEUT%: 59.9 % (ref 38.4–76.8)
Platelets: 192 10*3/uL (ref 145–400)
RBC: 4.53 10*6/uL (ref 3.70–5.45)
RDW: 14.2 % (ref 11.2–14.5)
WBC: 7 10*3/uL (ref 3.9–10.3)

## 2016-03-24 LAB — COMPREHENSIVE METABOLIC PANEL
ALBUMIN: 3.8 g/dL (ref 3.5–5.0)
ALK PHOS: 105 U/L (ref 40–150)
ALT: 35 U/L (ref 0–55)
AST: 24 U/L (ref 5–34)
Anion Gap: 8 mEq/L (ref 3–11)
BILIRUBIN TOTAL: 0.46 mg/dL (ref 0.20–1.20)
BUN: 32.3 mg/dL — AB (ref 7.0–26.0)
CALCIUM: 10.3 mg/dL (ref 8.4–10.4)
CO2: 26 mEq/L (ref 22–29)
Chloride: 108 mEq/L (ref 98–109)
Creatinine: 1.4 mg/dL — ABNORMAL HIGH (ref 0.6–1.1)
EGFR: 37 mL/min/{1.73_m2} — AB (ref >90)
Glucose: 145 mg/dl — ABNORMAL HIGH (ref 70–140)
POTASSIUM: 4.5 meq/L (ref 3.5–5.1)
Sodium: 142 mEq/L (ref 136–145)
Total Protein: 7.2 g/dL (ref 6.4–8.3)

## 2016-03-24 NOTE — Telephone Encounter (Signed)
Appointments scheduled per 11/21 LOS. Patient Given AVS report and calendars with future scheduled appointments.

## 2016-03-24 NOTE — Progress Notes (Signed)
[name] Follow-up office visit  ID: ARLIS EVERLY OB: [birth year]  MR#: [id number]  WSF#:[id number]  PCP: Dorothyann [name], Adrienne [name], Adrienne [name], Adrienne [name], N0, M0) - Unsigned [name] [name]   Staging form: [name], Adrienne 7th Edition     Clinical stage from 09/29/2012: Stage IA (T1, N0, cM0) - Unsigned     Pathologic: No stage assigned - Unsigned       [name] [name], Adrienne [name] (HCC)   08/25/2012 Receptors her2    ER 100% positive, PR 88% positive, HER-2 negative, Ki-67 16%      08/30/2012 Initial Diagnosis    [name] [name], Adrienne [name]      09/12/2012 Pathology Results    T1bN0 Grade 1 invasive ductal carcinoma, and DCIS.      09/12/2012 Surgery    Adrienne [name] lumpectomy and sentinel lymph node biopsy, negative margins.      11/09/2012 - 12/13/2012 Radiation Therapy    Adjuvant [name] radiation      12/2012 -  Anti-estrogen oral therapy    Anastrozole 1 mg daily, switched to Aromasin 25 mg daily in Jan 2015 due to diarrhea.        CURRENT THERAPY: Aromasin daily  INTERVAL HISTORY: Brynja returns for follow-up. She is moving to [facility] in a few weeks, busy with packing and selling her house  She has seeing her cardiologist for AF and HTN, meds adjusted  Still has mild intermittent diarrhea and constipation, no abdominal pain or nausea  She tolerate Aromasin well, no complains  Had a cataract surgery a few months ago, vision is much better  REVIEW OF SYSTEMS: A 10 point review of systems was conducted and is otherwise negative except for what is noted above.     PAST MEDICAL HISTORY: Past Medical History:  Diagnosis Date  . Allergy   . Atrial fibrillation ([place])   . [name] [name] ([place]) 08/25/12   Invasive ductal ca,DCIS  . Diabetes mellitus    type II  . Gout   . Hearing loss   . History of  frequent urinary tract infections   . Hx of radiation therapy 11/22/12- 12/19/12   [name] 4250 cGy 17 sessions, Adrienne [name] boost 750 cGy 3 sessions  . Hyperlipidemia   . Hypertension   . Osteopenia     PAST SURGICAL HISTORY: Past Surgical History:  Procedure Laterality Date  . ABDOMINAL HYSTERECTOMY Bilateral 1999   w/b/l salpingo-oopherectomy  . APPENDECTOMY    . [name] LUMPECTOMY WITH NEEDLE LOCALIZATION AND AXILLARY SENTINEL LYMPH NODE BX Adrienne 09/12/2012   Procedure: Adrienne NEEDLE LOCALIZATION [name] LUMPECTOMY TION AND AXILLARY SENTINEL LYMPH NODE BX;  Surgeon: Edward Jolly, MD;  Location: [facility];  Service: General;  Laterality: Adrienne;  . [name] SURGERY  1990's   right- fibroid cyst  . CHOLECYSTECTOMY    . SEPTOPLASTY    . TONSILLECTOMY      FAMILY HISTORY Family History  Problem Relation Age of Onset  . Hypertension Mother   . Heart disease Mother     Murmur and irregular heart disease  . Hypertension Father   . [name] Cousin     [name]    GYNECOLOGIC HISTORY: menarche at age 81, g56 p3, s/p TAH/BSO in early 106s.  Was on HRT for several years (cannot recall the duration).  No h/o abnormal pap smears or sexually transmitted infections.    SOCIAL HISTORY: Lives in [place], [place] in a three story house, will move to [facility] soon.  Her husband lives at a nursing home due to dementia.  She is a retired Pharmacist, hospital.  Her son and daughter live in [place].      ADVANCED DIRECTIVES: Not in place.    HEALTH MAINTENANCE: Social History  Substance Use Topics  . Smoking status: Former Smoker    Packs/day: 0.25    Years: 4.00    Types: Cigarettes  . Smokeless tobacco: Never Used     Comment: Cigarette use was 50 years ago  . Alcohol use Yes     Comment: Occ glass of wine with dinner    Mammogram: 08/25/2015 (negative)  Colonoscopy: 2011 Bone Density Scan: 03/05/2015 osteopenia  Pap Smear: s/p tah/bso Eye Exam: due Vitamin D Level:  Normal, 04/14/13 Lipid Panel:  unsure   Allergies  Allergen Reactions  . Penicillins Hives and Swelling  . Radiaplexrx [Woun'Dres Hydrogel Wound Dress] Hives    Current Outpatient Prescriptions  Medication Sig Dispense Refill  . allopurinol (ZYLOPRIM) 300 MG tablet Take 1 tablet (300 mg total) by mouth daily. 90 tablet 3  . apixaban (ELIQUIS) 5 MG TABS tablet Take 1 tablet (5 mg total) by mouth 2 (two) times daily. 180 tablet 3  . atenolol (TENORMIN) 25 MG tablet Take 1 tablet (25 mg total) by mouth daily. 30 tablet 12  . diltiazem (CARDIZEM CD) 360 MG 24 hr capsule Take 1 capsule (360 mg total) by mouth daily. 90 capsule 3  . exemestane (AROMASIN) 25 MG tablet Take 1 tablet (25 mg total) by mouth daily. 90 tablet 3  . fish oil-omega-3 fatty acids 1000 MG capsule Take 1 g by mouth daily.    Marland Kitchen ketoconazole (NIZORAL) 2 % cream APPLY 1 APPLICATION TO AFFECTED AREA ONCE A DAY EXTERNALLY 14 DAYS AS NEEDED  0  . metFORMIN (GLUCOPHAGE) 500 MG tablet Take 1 tablet (500 mg total) by mouth 2 (two) times daily. 180 tablet 3  . Multiple Vitamin (MULTIVITAMIN) tablet Take 1 tablet by mouth daily.    . mupirocin ointment (BACTROBAN) 2 % Apply 1 application topically 2 (two) times daily. 30 g 0  . OVER THE COUNTER MEDICATION Take by mouth 2 (two) times daily. Presavision- for macular degeneration prevention     No current facility-administered medications for this visit.     OBJECTIVE: Vitals:   03/24/16 1528  BP: 140/65  Pulse: 78  Resp: 18  Temp: 97.9 F (36.6 C)     Body mass index is 25.76 kg/m.     GENERAL: Patient is a well appearing female in no acute distress HEENT:  Sclerae anicteric.  Oropharynx clear and moist. No ulcerations or evidence of oropharyngeal candidiasis. Neck is supple.  NODES:  No cervical, supraclavicular, or axillary lymphadenopathy palpated.  [name] EXAM: Adrienne [name] s/p lumpectomy, a small mass underneath the recent biopsy site, with resolving skin ecchymosis. No other nodularity or masses,  right [name] no masses or nodules, benign bilateral [name] exam. Bilateral axillary exam was negative for adenopathy. LUNGS:  Clear to auscultation bilaterally.  No wheezes or rhonchi. HEART:  Regular rate and rhythm. No murmur appreciated. ABDOMEN:  Soft, nontender.  Positive, normoactive bowel sounds. No organomegaly palpated. MSK:  No focal spinal tenderness to palpation. Full range of motion bilaterally in the upper extremities. EXTREMITIES:  No peripheral edema.   SKIN:  Clear with no obvious  rashes or skin changes. No nail dyscrasia. NEURO:  Nonfocal. Well oriented.  Appropriate affect. ECOG FS:1 - Symptomatic but completely ambulatory  LAB RESULTS: CBC Latest Ref Rng & Units 03/24/2016 03/10/2016 09/16/2015  WBC 3.9 - 10.3 10e3/uL 7.0 7.3 6.3  Hemoglobin 11.6 - 15.9 g/dL 14.3 14.5 14.4  Hematocrit 34.8 - 46.6 % 43.0 43.8 43.2  Platelets 145 - 400 10e3/uL 192 183.0 224    CMP Latest Ref Rng & Units 03/24/2016 03/10/2016 09/16/2015  Glucose 70 - 140 mg/dl 145(H) 143(H) 138  BUN 7.0 - 26.0 mg/dL 32.3(H) 23 21.2  Creatinine 0.6 - 1.1 mg/dL 1.4(H) 1.23(H) 1.1  Sodium 136 - 145 mEq/L 142 141 140  Potassium 3.5 - 5.1 mEq/L 4.5 4.2 3.5  Chloride 96 - 112 mEq/L - 104 -  CO2 22 - 29 mEq/L _0 Calcium 8.4 - 10.4 mg/dL 10.3 9.9 10.3  Total Protein 6.4 - 8.3 g/dL 7.2 6.9 7.2  Total Bilirubin 0.20 - 1.20 mg/dL 0.46 0.7 0.55  Alkaline Phos 40 - 150 U/L 105 87 98  AST 5 - 34 U/L _1 ALT 0 - 55 U/L 35 21 32   PATHOLOGY REPORT Diagnosis  08/28/2015 [name], Adrienne, needle core biopsy - FAT NECROSIS. - THERE IS NO EVIDENCE OF MALIGNANCY. - SEE COMMENT.  STUDIES: I have requested her recent mammogram report from [place].  Adrienne [name] mammogram and ultrasound 02/27/2016 Impression: probably benign The 4 mm oval mass in the Adrienne [name] 12:00 position likely represent fat necrosis, the 3 mm mass in the Adrienne [name] upper inner quadrant middle depth most likely is fat necrosis, the 4 mm  oval cyst in the Adrienne [name] upper inner quadrant posterior depth is benign. Post surgical scar in the Adrienne [name] upper outer quadrant anterior depth is benign.  ASSESSMENT: 81 y.o. , [place] woman   1. pT1bN0, stage IA invasive ductal carcinoma of the Adrienne [name], grade I, ER 100%, PR 88%, Ki-67 16%, HER-2/neu negative. -she underwent lumpectomy with sentinel lymph node biopsy on 09/12/2012, s/p radiation therapy from 11/09/2012 through 12/19/2012.  -She is clinically doing very well, her lab and physical exam today was unremarkable. Her Adrienne [name] biopsy in 08/2015 showed fat necrosis. No clinical evidence of recurrence. -Her repeated mammogram and ultrasound in October 2070 again showed multiple small lesions, likely all benign lesions -she is adjuvant Aromasin, tolerating well, we'll continue, for total 5 years, until 11/2017  -She'll continue surveillance with annual mammogram, follow-up with [name] every 6 months -I encouraged her to continue healthy diet and regular exercise.  2. Osteopenia -She is on vitamin D supplement daily. -Giving her worsening hypercalcemia, I told her to stop calcium  -Her recent bone density scan from 03/05/2015 showed osteopenia, with 10 year probability of major ostial parotic fracture 25%, and hip fracture 15%. Giving the high risk of fracture, she would benefit from biphosphonate or Prolia injection. I discussed the benefit and potential side effects, and written material of Prolia was given to her on last visit  -She has decided to start Prolia in a few weeks   3. Hypercalcemia -Her calcium was 10.7 in 2016, 10.3 today, since I reduced her calcium supplement -She knows to drink adequately and avoid dehydration  -She'll follow up with primary care physician for further workup.   Plan -Continue Aromasin, I refilled for her  -first Prolia injection in a few weeks -I'll see her back in 6 month -next diagnostic mammogram and ultrasound of Adrienne [name]  in  April 2018  I spent 25 minutes counseling the patient face to face.  The total time spent in the appointment was 30 minutes.  Truitt Merle   03/24/2016 4:09 PM

## 2016-03-25 NOTE — Progress Notes (Signed)
HPI: FU atrial fibrillation. Echo 12/16 showed EF 50, mild biatrial enlargement, mild MR. TSH 11/16-3.67. Holter monitor February 2017 showed atrial fibrillation with PVCs or aberrantly conducted beats. Seen in August and heart rate remained elevated. Cardizem increased and metoprolol eventually added. Holter repeated September 2017 and showed rate was controlled. Since last seen, the patient denies any dyspnea on exertion, orthopnea, PND, pedal edema, palpitations, syncope or chest pain.   Current Outpatient Prescriptions  Medication Sig Dispense Refill  . allopurinol (ZYLOPRIM) 300 MG tablet Take 1 tablet (300 mg total) by mouth daily. 90 tablet 3  . apixaban (ELIQUIS) 5 MG TABS tablet Take 1 tablet (5 mg total) by mouth 2 (two) times daily. 180 tablet 3  . atenolol (TENORMIN) 25 MG tablet Take 1 tablet (25 mg total) by mouth daily. 30 tablet 12  . diltiazem (CARDIZEM CD) 360 MG 24 hr capsule Take 1 capsule (360 mg total) by mouth daily. 90 capsule 3  . exemestane (AROMASIN) 25 MG tablet Take 1 tablet (25 mg total) by mouth daily. 90 tablet 3  . fish oil-omega-3 fatty acids 1000 MG capsule Take 1 g by mouth daily.    Marland Kitchen ketoconazole (NIZORAL) 2 % cream APPLY 1 APPLICATION TO AFFECTED AREA ONCE A DAY EXTERNALLY 14 DAYS AS NEEDED  0  . metFORMIN (GLUCOPHAGE) 500 MG tablet Take 1 tablet (500 mg total) by mouth 2 (two) times daily. 180 tablet 3  . Multiple Vitamin (MULTIVITAMIN) tablet Take 1 tablet by mouth daily.    . mupirocin ointment (BACTROBAN) 2 % Apply 1 application topically 2 (two) times daily. 30 g 0  . OVER THE COUNTER MEDICATION Take by mouth 2 (two) times daily. Presavision- for macular degeneration prevention     No current facility-administered medications for this visit.      Past Medical History:  Diagnosis Date  . Allergy   . Atrial fibrillation ([place])   . Breast cancer ([place]) 08/25/12   Invasive ductal ca,DCIS  . Diabetes mellitus    type II  . Gout   . Hearing  loss   . History of frequent urinary tract infections   . Hx of radiation therapy 11/22/12- 12/19/12   breast 4250 cGy 17 sessions, left breast boost 750 cGy 3 sessions  . Hyperlipidemia   . Hypertension   . Osteopenia     Past Surgical History:  Procedure Laterality Date  . ABDOMINAL HYSTERECTOMY Bilateral 1999   w/b/l salpingo-oopherectomy  . APPENDECTOMY    . BREAST LUMPECTOMY WITH NEEDLE LOCALIZATION AND AXILLARY SENTINEL LYMPH NODE BX Left 09/12/2012   Procedure: LEFT NEEDLE LOCALIZATION BREAST LUMPECTOMY TION AND AXILLARY SENTINEL LYMPH NODE BX;  Surgeon: Edward Jolly, MD;  Location: [facility];  Service: General;  Laterality: Left;  . BREAST SURGERY  1990's   right- fibroid cyst  . CHOLECYSTECTOMY    . SEPTOPLASTY    . TONSILLECTOMY      Social History   Social History  . Marital status: Married    Spouse name: N/A  . Number of children: 3  . Years of education: N/A   Occupational History  . Not on file.   Social History Main Topics  . Smoking status: Former Smoker    Packs/day: 0.25    Years: 4.00    Types: Cigarettes  . Smokeless tobacco: Never Used     Comment: Cigarette use was 50 years ago  . Alcohol use Yes     Comment: Occ glass of wine  with dinner  . Drug use: No  . Sexual activity: No     Comment: menarche age 3, 72st birth age 55, G25, HRT x 7 years?   Other Topics Concern  . Not on file   Social History Narrative   Married - husband in memory care unit    Three children ( One lives at [place] and two in [place]      She likes to read and go to beach     Family History  Problem Relation Age of Onset  . Hypertension Mother   . Heart disease Mother     Murmur and irregular heart disease  . Hypertension Father   . Cancer Cousin     breast    ROS: no fevers or chills, productive cough, hemoptysis, dysphasia, odynophagia, melena, hematochezia, dysuria, hematuria, rash, seizure activity, orthopnea, PND, pedal edema, claudication.  Remaining systems are negative.  Physical Exam: Well-developed well-nourished in no acute distress.  Skin is warm and dry.  HEENT is normal.  Neck is supple.  Chest is clear to auscultation with normal expansion.  Cardiovascular exam is irregular Abdominal exam nontender or distended. No masses palpated. Extremities show no edema. neuro grossly intact  ECG  A/P  1 Permanent atrial fibrillation-continue Cardizem and metoprolol for rate control. Continue apixaban.   2 hypertension-blood pressure controlled. Continue present medications.  3 hyperlipidemia-management per primary care.  Kirk Ruths, MD

## 2016-03-30 ENCOUNTER — Encounter: Payer: Self-pay | Admitting: Cardiology

## 2016-04-06 ENCOUNTER — Encounter: Payer: Self-pay | Admitting: Cardiology

## 2016-04-06 ENCOUNTER — Ambulatory Visit (INDEPENDENT_AMBULATORY_CARE_PROVIDER_SITE_OTHER): Payer: Medicare Other | Admitting: Cardiology

## 2016-04-06 VITALS — BP 152/70 | HR 96 | Ht 62.0 in | Wt 140.0 lb

## 2016-04-06 DIAGNOSIS — I4821 Permanent atrial fibrillation: Secondary | ICD-10-CM

## 2016-04-06 DIAGNOSIS — I10 Essential (primary) hypertension: Secondary | ICD-10-CM | POA: Diagnosis not present

## 2016-04-06 DIAGNOSIS — I482 Chronic atrial fibrillation: Secondary | ICD-10-CM | POA: Diagnosis not present

## 2016-04-06 NOTE — Patient Instructions (Signed)
Your physician wants you to follow-up in: 6 MONTHS WITH DR CRENSHAW You will receive a reminder letter in the mail two months in advance. If you don't receive a letter, please call our office to schedule the follow-up appointment.   If you need a refill on your cardiac medications before your next appointment, please call your pharmacy.  

## 2016-04-17 ENCOUNTER — Other Ambulatory Visit: Payer: Self-pay | Admitting: Family Medicine

## 2016-04-23 ENCOUNTER — Other Ambulatory Visit: Payer: Self-pay | Admitting: Hematology

## 2016-04-23 DIAGNOSIS — C50512 Malignant neoplasm of lower-outer quadrant of left female breast: Secondary | ICD-10-CM

## 2016-06-22 ENCOUNTER — Other Ambulatory Visit: Payer: Self-pay | Admitting: Family Medicine

## 2016-07-08 ENCOUNTER — Other Ambulatory Visit: Payer: Self-pay | Admitting: Cardiology

## 2016-07-08 DIAGNOSIS — I4821 Permanent atrial fibrillation: Secondary | ICD-10-CM

## 2016-07-08 DIAGNOSIS — I480 Paroxysmal atrial fibrillation: Secondary | ICD-10-CM

## 2016-07-24 ENCOUNTER — Other Ambulatory Visit: Payer: Self-pay | Admitting: Cardiology

## 2016-07-24 DIAGNOSIS — I4821 Permanent atrial fibrillation: Secondary | ICD-10-CM

## 2016-07-24 DIAGNOSIS — I480 Paroxysmal atrial fibrillation: Secondary | ICD-10-CM

## 2016-09-01 ENCOUNTER — Encounter: Payer: Self-pay | Admitting: Hematology

## 2016-09-02 ENCOUNTER — Encounter: Payer: Self-pay | Admitting: Adult Health

## 2016-09-02 ENCOUNTER — Ambulatory Visit (INDEPENDENT_AMBULATORY_CARE_PROVIDER_SITE_OTHER): Payer: Medicare Other | Admitting: Adult Health

## 2016-09-02 VITALS — BP 130/70 | Temp 98.1°F | Ht 62.0 in | Wt 139.0 lb

## 2016-09-02 DIAGNOSIS — E109 Type 1 diabetes mellitus without complications: Secondary | ICD-10-CM

## 2016-09-02 DIAGNOSIS — E139 Other specified diabetes mellitus without complications: Secondary | ICD-10-CM

## 2016-09-02 LAB — POCT GLYCOSYLATED HEMOGLOBIN (HGB A1C): HEMOGLOBIN A1C: 7.8

## 2016-09-02 NOTE — Patient Instructions (Signed)
It was great seeing you today   Your A1c went from 7.0 to 7.8   I want to see you back in 3 months to check this again.   Be mindful of diet

## 2016-09-02 NOTE — Progress Notes (Signed)
Subjective:    Patient ID: Adrienne [name], female    DOB: 04/27/1935, 82 y.o.   MRN: [id number]  HPI  82 year old female who presents to the office today for 6 month follow up regarding diabetes. During her last visit her A1c was 7.0. She is currently managed on Metformin 500 mg BID.   She reports moving into a new facility ( [facility]). Since her move she has been eating more sweets. She states " they feed [name] too good there."   She also reports that since moving into this facility she has been exercising more often. She has joined an exercise class.   She does check her blood sugars at home and reports readings between 116 - 160. She denies any blood sugar readings above 200.    Review of Systems  Constitutional: Negative.   Respiratory: Negative.   Cardiovascular: Negative.   Gastrointestinal: Negative.   Neurological: Negative.   All other systems reviewed and are negative.  Past Medical History:  Diagnosis Date  . Allergy   . Atrial fibrillation ([place])   . Breast cancer ([place]) 08/25/12   Invasive ductal ca,DCIS  . Diabetes mellitus    type II  . Gout   . Hearing loss   . History of frequent urinary tract infections   . Hx of radiation therapy 11/22/12- 12/19/12   breast 4250 cGy 17 sessions, left breast boost 750 cGy 3 sessions  . Hyperlipidemia   . Hypertension   . Osteopenia     Social History   Social History  . Marital status: Married    Spouse name: N/A  . Number of children: 3  . Years of education: N/A   Occupational History  . Not on file.   Social History Main Topics  . Smoking status: Former Smoker    Packs/day: 0.25    Years: 4.00    Types: Cigarettes  . Smokeless tobacco: Never Used     Comment: Cigarette use was 50 years ago  . Alcohol use Yes     Comment: Occ glass of wine with dinner  . Drug use: No  . Sexual activity: No     Comment: menarche age 61, 34st birth age 47, G49, HRT x 7 years?   Other Topics Concern  . Not on file    Social History Narrative   Married - husband in memory care unit    Three children ( One lives at [place] and two in [place]      She likes to read and go to beach     Past Surgical History:  Procedure Laterality Date  . ABDOMINAL HYSTERECTOMY Bilateral 1999   w/b/l salpingo-oopherectomy  . APPENDECTOMY    . BREAST LUMPECTOMY WITH NEEDLE LOCALIZATION AND AXILLARY SENTINEL LYMPH NODE BX Left 09/12/2012   Procedure: LEFT NEEDLE LOCALIZATION BREAST LUMPECTOMY TION AND AXILLARY SENTINEL LYMPH NODE BX;  Surgeon: Edward Jolly, MD;  Location: [facility];  Service: General;  Laterality: Left;  . BREAST SURGERY  1990's   right- fibroid cyst  . CHOLECYSTECTOMY    . SEPTOPLASTY    . TONSILLECTOMY      Family History  Problem Relation Age of Onset  . Hypertension Mother   . Heart disease Mother     Murmur and irregular heart disease  . Hypertension Father   . Cancer Cousin     breast    Allergies  Allergen Reactions  . Penicillins Hives and Swelling  . Radiaplexrx [Woun'Dres  Hydrogel Wound Dress] Hives    Current Outpatient Prescriptions on File Prior to Visit  Medication Sig Dispense Refill  . allopurinol (ZYLOPRIM) 300 MG tablet TAKE 1 TABLET (300 MG TOTAL) BY MOUTH DAILY. **DNF 04/06/15** 90 tablet 3  . diltiazem (CARDIZEM CD) 360 MG 24 hr capsule Take 1 capsule (360 mg total) by mouth daily. 90 capsule 3  . ELIQUIS 5 MG TABS tablet TAKE 1 TABLET (5 MG TOTAL) BY MOUTH 2 (TWO) TIMES DAILY. 180 tablet 1  . exemestane (AROMASIN) 25 MG tablet TAKE 1 TABLET (25 MG TOTAL) BY MOUTH DAILY. 90 tablet 3  . fish oil-omega-3 fatty acids 1000 MG capsule Take 1 g by mouth daily.    Marland Kitchen ketoconazole (NIZORAL) 2 % cream APPLY 1 APPLICATION TO AFFECTED AREA ONCE A DAY EXTERNALLY 14 DAYS AS NEEDED  0  . metFORMIN (GLUCOPHAGE) 500 MG tablet TAKE 1 TABLET (500 MG TOTAL) BY MOUTH 2 (TWO) TIMES DAILY. 180 tablet 0  . Multiple Vitamin (MULTIVITAMIN) tablet Take 1 tablet by mouth daily.    .  mupirocin ointment (BACTROBAN) 2 % Apply 1 application topically 2 (two) times daily. 30 g 0  . OVER THE COUNTER MEDICATION Take by mouth 2 (two) times daily. Presavision- for macular degeneration prevention    . atenolol (TENORMIN) 25 MG tablet Take 1 tablet (25 mg total) by mouth daily. 30 tablet 12   No current facility-administered medications on file prior to visit.     BP 130/70 (BP Location: Left Arm, Patient Position: Sitting, Cuff Size: Normal)   Temp 98.1 F (36.7 C) (Oral)   Ht 5\' 2"  (1.575 m)   Wt 139 lb (63 kg)   BMI 25.42 kg/m       Objective:   Physical Exam  Constitutional: She is oriented to person, place, and time. She appears well-developed and well-nourished. No distress.  Cardiovascular: Normal rate, regular rhythm, normal heart sounds and intact distal pulses.  Exam reveals no gallop and no friction rub.   No murmur heard. Pulmonary/Chest: Effort normal and breath sounds normal. No respiratory distress. She has no wheezes. She has no rales. She exhibits no tenderness.  Neurological: She is alert and oriented to person, place, and time.  Skin: Skin is warm and dry. No rash noted. She is not diaphoretic. No erythema. No pallor.  Psychiatric: She has a normal mood and affect. Her behavior is normal. Judgment and thought content normal.  Nursing note and vitals reviewed.     Assessment & Plan:  1. Diabetes 1.5, managed as type 2 ([place]) - POC HgB A1c - 7.8  - We spoke about increasing Metformin dose. She would like to work on her diet and recheck in 3 months. I am ok with this plan  - Follow up in 3 months  - Work on reducing sweets   [name], NP

## 2016-09-18 NOTE — Progress Notes (Signed)
[facility] Follow-up office visit  ID: HERTHA GERGEN OB: 17-Aug-1934  MR#: [id number]  PXT#:[id number]  PCP: [name], NP     CHIEF COMPLAINT: breast cancer f/u  Oncology History   Breast cancer, left breast   Staging form: Breast, AJCC 7th Edition     Clinical stage from 09/12/2012: Stage IA (T1b, N0, M0) - Unsigned Breast cancer   Staging form: Breast, AJCC 7th Edition     Clinical stage from 09/29/2012: Stage IA (T1, N0, cM0) - Unsigned     Pathologic: No stage assigned - Unsigned       Breast cancer, left breast (HCC)   08/25/2012 Receptors her2    ER 100% positive, PR 88% positive, HER-2 negative, Ki-67 16%      08/30/2012 Initial Diagnosis    Breast cancer, left breast      09/12/2012 Pathology Results    T1bN0 Grade 1 invasive ductal carcinoma, and DCIS.      09/12/2012 Surgery    Left breast lumpectomy and sentinel lymph node biopsy, negative margins.      11/09/2012 - 12/13/2012 Radiation Therapy    Adjuvant breast radiation      12/2012 -  Anti-estrogen oral therapy    Anastrozole 1 mg daily, switched to Aromasin 25 mg daily in Jan 2015 due to diarrhea.      09/01/2016 Imaging    [name] Outside films Breast form [facility] 09/01/2016 IMPRESSION: Probably Benign 1. No significant change in oval hypoechoic masses in the left breast at 11:00 2 cm from the nipple and 10:30 4 cm from the nipple. Both of these masses resemble the area of fat necrosis previously biopsies at 2:00. In addition, both masses have developing central benign or dystrophic appearing calcifications and are favored to be other areas of fat necrosis. A 6 month follow-up left breast mammogram and ultrasound is recommended.      09/01/2016 Mammogram    HM Mammogram from [facility] 09/01/16 IMPRESSION  Incomplete - additional imaging evaluation needed Ultrasound of the upper medial left breast mass is recommended.         CURRENT THERAPY: Aromasin daily  INTERVAL HISTORY:  Adrienne [name] returns  for follow-up. She  Presents ot the clinic today reporting she has a "bum left leg" today. She picked up a suitcase that was heavy and twisted. [facility] because she was barely abel to walk and they put her on Predisone. Now she can walk. She does not need a cane now. She takes extra strength tylenol as needed. She went to dermatologist to remove precancer legions. She had a mammogram taken this month. Will have another mammogram in 6 months that is scheduled with [facility]. She did not get injection and she does not want to do injection. She is concerned with shots. She rather take shot than medication. She reports losing some hair on her head. Her sugar was high and she was concerned with it.     REVIEW OF SYSTEMS: A 10 point review of systems was conducted and is otherwise negative except for what is noted above.     PAST MEDICAL HISTORY: Past Medical History:  Diagnosis Date  . Allergy   . Atrial fibrillation ([place])   . Breast cancer ([place]) 08/25/12   Invasive ductal ca,DCIS  . Diabetes mellitus    type II  . Gout   . Hearing loss   . History of frequent urinary tract infections   . Hx of radiation therapy  11/22/12- 12/19/12   breast 4250 cGy 17 sessions, left breast boost 750 cGy 3 sessions  . Hyperlipidemia   . Hypertension   . Osteopenia     PAST SURGICAL HISTORY: Past Surgical History:  Procedure Laterality Date  . ABDOMINAL HYSTERECTOMY Bilateral 1999   w/b/l salpingo-oopherectomy  . APPENDECTOMY    . BREAST LUMPECTOMY WITH NEEDLE LOCALIZATION AND AXILLARY SENTINEL LYMPH NODE BX Left 09/12/2012   Procedure: LEFT NEEDLE LOCALIZATION BREAST LUMPECTOMY TION AND AXILLARY SENTINEL LYMPH NODE BX;  Surgeon: Edward Jolly, MD;  Location: [facility];  Service: General;  Laterality: Left;  . BREAST SURGERY  1990's   right- fibroid cyst  . CHOLECYSTECTOMY    . SEPTOPLASTY    . TONSILLECTOMY      FAMILY HISTORY Family History  Problem Relation Age of Onset   . Hypertension Mother   . Heart disease Mother        Murmur and irregular heart disease  . Hypertension Father   . Cancer Cousin        breast    GYNECOLOGIC HISTORY: menarche at age 65, g29 p3, s/p TAH/BSO in early 60s.  Was on HRT for several years (cannot recall the duration).  No h/o abnormal pap smears or sexually transmitted infections.    SOCIAL HISTORY: Lives in [place], [place] in a three story house, will move to [facility] soon.  Her husband lives at a nursing home due to dementia.  She is a retired Pharmacist, hospital.  Her son and daughter live in [place].      ADVANCED DIRECTIVES: Not in place.    HEALTH MAINTENANCE: Social History  Substance Use Topics  . Smoking status: Former Smoker    Packs/day: 0.25    Years: 4.00    Types: Cigarettes  . Smokeless tobacco: Never Used     Comment: Cigarette use was 50 years ago  . Alcohol use Yes     Comment: Occ glass of wine with dinner    Mammogram: 09/01/16  Colonoscopy: 2011 Bone Density Scan: 03/05/2015 osteopenia  Pap Smear: s/p tah/bso Eye Exam: due Vitamin D Level:  Normal, 04/14/13 Lipid Panel: unsure   Allergies  Allergen Reactions  . Penicillins Hives and Swelling  . Radiaplexrx [Woun'Dres Hydrogel Wound Dress] Hives    Current Outpatient Prescriptions  Medication Sig Dispense Refill  . allopurinol (ZYLOPRIM) 300 MG tablet TAKE 1 TABLET (300 MG TOTAL) BY MOUTH DAILY. **DNF 04/06/15** 90 tablet 3  . atenolol (TENORMIN) 25 MG tablet Take 1 tablet (25 mg total) by mouth daily. 30 tablet 12  . diltiazem (CARDIZEM CD) 360 MG 24 hr capsule Take 1 capsule (360 mg total) by mouth daily. 90 capsule 3  . ELIQUIS 5 MG TABS tablet TAKE 1 TABLET (5 MG TOTAL) BY MOUTH 2 (TWO) TIMES DAILY. 180 tablet 1  . exemestane (AROMASIN) 25 MG tablet TAKE 1 TABLET (25 MG TOTAL) BY MOUTH DAILY. 90 tablet 3  . fish oil-omega-3 fatty acids 1000 MG capsule Take 1 g by mouth daily.    Marland Kitchen ketoconazole (NIZORAL) 2 % cream APPLY 1 APPLICATION  TO AFFECTED AREA ONCE A DAY EXTERNALLY 14 DAYS AS NEEDED  0  . metFORMIN (GLUCOPHAGE) 500 MG tablet TAKE 1 TABLET (500 MG TOTAL) BY MOUTH 2 (TWO) TIMES DAILY. 180 tablet 0  . Multiple Vitamin (MULTIVITAMIN) tablet Take 1 tablet by mouth daily.    Marland Kitchen OVER THE COUNTER MEDICATION Take by mouth 2 (two) times daily. Presavision- for macular degeneration prevention    .  predniSONE (STERAPRED UNI-PAK 21 TAB) 5 MG (21) TBPK tablet As directed 21 tablet 0  . HYDROcodone-acetaminophen (NORCO/VICODIN) 5-325 MG tablet Take 1-2 tablets by mouth every 4 (four) hours as needed. (Patient not taking: Reported on 09/21/2016) 10 tablet 0  . mupirocin ointment (BACTROBAN) 2 % Apply 1 application topically 2 (two) times daily. 30 g 0   No current facility-administered medications for this visit.     OBJECTIVE: Vitals:   09/21/16 1032  BP: 139/75  Pulse: 92  Resp: 18  Temp: 97.7 F (36.5 C)     Body mass index is 26.06 kg/m.      GENERAL: Patient is a well appearing female in no acute distress HEENT:  Sclerae anicteric.  Oropharynx clear and moist. No ulcerations or evidence of oropharyngeal candidiasis. Neck is supple.  NODES:  No cervical, supraclavicular, or axillary lymphadenopathy palpated.  BREAST EXAM: left breast s/p lumpectomy, a small mass underneath the recent biopsy site, with resolving skin ecchymosis. No other nodularity or masses, right breast no masses or nodules, benign bilateral breast exam. Bilateral axillary exam was negative for adenopathy. LUNGS:  Clear to auscultation bilaterally.  No wheezes or rhonchi. HEART:  Regular rate and rhythm. No murmur appreciated. ABDOMEN:  Soft, nontender.  Positive, normoactive bowel sounds. No organomegaly palpated. MSK:  No focal spinal tenderness to palpation. Full range of motion bilaterally in the upper extremities. EXTREMITIES:  No peripheral edema.   SKIN:  Clear with no obvious rashes or skin changes. No nail dyscrasia. NEURO:  Nonfocal. Well  oriented.  Appropriate affect. Breasts: Breast inspection showed them to be symmetrical with no nipple discharge. Palpation of the breasts and axilla revealed no obvious mass that I could appreciate.  ECOG FS:1 - Symptomatic but completely ambulatory  LAB RESULTS: CBC Latest Ref Rng & Units 09/21/2016 03/24/2016 03/10/2016  WBC 3.9 - 10.3 10e3/uL 13.9(H) 7.0 7.3  Hemoglobin 11.6 - 15.9 g/dL 13.4 14.3 14.5  Hematocrit 34.8 - 46.6 % 40.9 43.0 43.8  Platelets 145 - 400 10e3/uL 226 192 183.0    CMP Latest Ref Rng & Units 09/21/2016 03/24/2016 03/10/2016  Glucose 70 - 140 mg/dl 294(H) 145(H) 143(H)  BUN 7.0 - 26.0 mg/dL 31.3(H) 32.3(H) 23  Creatinine 0.6 - 1.1 mg/dL 1.4(H) 1.4(H) 1.23(H)  Sodium 136 - 145 mEq/L 137 142 141  Potassium 3.5 - 5.1 mEq/L 4.4 4.5 4.2  Chloride 96 - 112 mEq/L - - 104  CO2 22 - 29 mEq/L '23 26 28  '$ Calcium 8.4 - 10.4 mg/dL 10.2 10.3 9.9  Total Protein 6.4 - 8.3 g/dL 7.2 7.2 6.9  Total Bilirubin 0.20 - 1.20 mg/dL 0.67 0.46 0.7  Alkaline Phos 40 - 150 U/L 85 105 87  AST 5 - 34 U/L '17 24 19  '$ ALT 0 - 55 U/L 22 35 21   PATHOLOGY REPORT Diagnosis  08/28/2015 Breast, left, needle core biopsy - FAT NECROSIS. - THERE IS NO EVIDENCE OF MALIGNANCY. - SEE COMMENT.  STUDIES: I have requested her recent mammogram report from [facility].  [name] Outside films Breast form [facility] 09/01/2016 IMPRESSION: Probably Benign 1. No significant change in oval hypoechoic masses in the left breast at 11:00 2 cm from the nipple and 10:30 4 cm from the nipple. Both of these masses resemble the area of fat necrosis previously biopsies at 2:00. In addition, both masses have developing central benign or dystrophic appearing calcifications and are favored to be other areas of fat necrosis. A 6 month follow-up left breast mammogram and ultrasound  is recommended.  HM Mammogram from [facility] 09/01/16 IMPRESSION  Incomplete - additional imaging evaluation needed Ultrasound of the upper medial left breast mass is  recommended.    Left breast mammogram and ultrasound 02/27/2016 Impression: probably benign The 4 mm oval mass in the left breast 12:00 position likely represent fat necrosis, the 3 mm mass in the left breast upper inner quadrant middle depth most likely is fat necrosis, the 4 mm oval cyst in the left breast upper inner quadrant posterior depth is benign. Post surgical scar in the left breast upper outer quadrant anterior depth is benign.  ASSESSMENT: 82 y.o. [place], [place] woman   1. pT1bN0, stage IA invasive ductal carcinoma of the left breast, grade I, ER 100%, PR 88%, Ki-67 16%, HER-2/neu negative. -she underwent lumpectomy with sentinel lymph node biopsy on 09/12/2012, s/p radiation therapy from 11/09/2012 through 12/19/2012.  -She is clinically doing very well, her lab and physical exam today was unremarkable. Her left breast biopsy in 08/2015 showed fat necrosis. No clinical evidence of recurrence. -Her repeated mammogram in May 2018 showed 2 lesions in UIQ of left breast, stable, likely benign (fat necrosis per biopsy in 2017 ), will follow up in 6 months  -she is on adjuvant Aromasin, tolerating well, we'll continue, for total 5 years, until 11/2017  -She'll continue surveillance with annual mammogram -I previously encouraged her to continue healthy diet and regular exercise.   2. Osteopenia -She is on vitamin D supplement daily. -Giving her worsening hypercalcemia, I told her to stop calcium  -Her recent bone density scan from 03/05/2015 showed osteopenia, with 10 year probability of major ostial parotic fracture 25%, and hip fracture 15%. Giving the high risk of fracture, she would benefit from bisphosphonate or Prolia injection. I previously discussed the benefit and potential side effects, and written material of Prolia was given to her on last visit  -She may have take biphosphonate in the past, she cannot remember for sure -She has not done Prolia injection yet and she is hesitant  to due to the concern of side effects  3. Hypercalcemia -Her calcium was 10.7 in 2016, 10.3 today, since I reduced her calcium supplement -She knows to drink adequately and avoid dehydration  -She'll follow up with primary care physician for further workup.   4. Diabetes and hyperglycemia  -She noticed hyperglycemia since she started steroids for her left hip pain -I encouraged her to f/u with PCP to see if she needs to change her metformin medication or dosage as her sugar has been higher lately.  -I also suggested she stop taking her steroid medication as she no longer needs it.   Plan -Mammogrom in 6 months  -labs and f/u 6 months  -Continue Aromasin   I spent 20 minutes counseling the patient face to face.  The total time spent in the appointment was 25 minutes.  This document serves as a record of services personally performed by Truitt Merle, MD. It was created on her behalf by Joslyn Devon, a trained medical scribe. The creation of this record is based on the scribe's personal observations and the provider's statements to them. This document has been checked and approved by the attending provider.   Truitt Merle   09/21/2016

## 2016-09-19 ENCOUNTER — Ambulatory Visit (INDEPENDENT_AMBULATORY_CARE_PROVIDER_SITE_OTHER): Payer: Medicare Other

## 2016-09-19 ENCOUNTER — Encounter (HOSPITAL_COMMUNITY): Payer: Self-pay | Admitting: Family Medicine

## 2016-09-19 ENCOUNTER — Ambulatory Visit (HOSPITAL_COMMUNITY)
Admission: EM | Admit: 2016-09-19 | Discharge: 2016-09-19 | Disposition: A | Discharge status: Home / Self Care | Payer: Medicare Other | Attending: Internal Medicine | Admitting: Internal Medicine

## 2016-09-19 ENCOUNTER — Other Ambulatory Visit: Payer: Self-pay | Admitting: Family Medicine

## 2016-09-19 DIAGNOSIS — M5432 Sciatica, left side: Secondary | ICD-10-CM

## 2016-09-19 DIAGNOSIS — M25552 Pain in left hip: Secondary | ICD-10-CM | POA: Diagnosis not present

## 2016-09-19 MED ORDER — HYDROCODONE-ACETAMINOPHEN 5-325 MG PO TABS: 1.0000 | ORAL_TABLET | ORAL | 0 refills | Status: DC | PRN

## 2016-09-19 MED ORDER — PREDNISONE 5 MG (21) PO TBPK: ORAL_TABLET | ORAL | 0 refills | Status: DC

## 2016-09-19 NOTE — ED Triage Notes (Signed)
Pt here for left hip pain worsening since twisting it picking up a suit case weeks ago. sts now the pain is radiating down her left leg.

## 2016-09-19 NOTE — Discharge Instructions (Signed)
It is unclear if your pain is coming from your left hip or radiating from your back. Your exam shows stability which is reassuring. Will treat you with a prednisone pack for inflammation. Use ES Tylenol 2 every 4 hours for pain. May use Norco for severe pain if needed (dont drive while taking this). If you are not improving then follow up with an Orthopedic of your choice. A suggestion is provided. Treat with heat and rest over the rest of the weekend.

## 2016-09-19 NOTE — ED Provider Notes (Signed)
CSN: [id number]     Arrival date & time 09/19/16  1639 History   First MD Initiated Contact with Patient 09/19/16 1708     Chief Complaint  Patient presents with  . Hip Pain   (Consider location/radiation/quality/duration/timing/severity/associated sxs/prior Treatment) 82 yo presents with left buttock, lateral hip and femur pain. She reports that she "twisted her leg" a week ago but was able to bear weight. Just over the last few days her pain worsened after taking an exercise class at [facility] where she resides. Her pain is mainly in the left thigh, pain with standing or walking. Some mild back pain is noted. No numbness, tingling or weakness is noted in the leg. No swelling or bruising. No prior history of injury to the leg.       Past Medical History:  Diagnosis Date  . Allergy   . Atrial fibrillation ([place])   . Breast cancer ([place]) 08/25/12   Invasive ductal ca,DCIS  . Diabetes mellitus    type II  . Gout   . Hearing loss   . History of frequent urinary tract infections   . Hx of radiation therapy 11/22/12- 12/19/12   breast 4250 cGy 17 sessions, left breast boost 750 cGy 3 sessions  . Hyperlipidemia   . Hypertension   . Osteopenia    Past Surgical History:  Procedure Laterality Date  . ABDOMINAL HYSTERECTOMY Bilateral 1999   w/b/l salpingo-oopherectomy  . APPENDECTOMY    . BREAST LUMPECTOMY WITH NEEDLE LOCALIZATION AND AXILLARY SENTINEL LYMPH NODE BX Left 09/12/2012   Procedure: LEFT NEEDLE LOCALIZATION BREAST LUMPECTOMY TION AND AXILLARY SENTINEL LYMPH NODE BX;  Surgeon: Edward Jolly, MD;  Location: [place];  Service: General;  Laterality: Left;  . BREAST SURGERY  1990's   right- fibroid cyst  . CHOLECYSTECTOMY    . SEPTOPLASTY    . TONSILLECTOMY     Family History  Problem Relation Age of Onset  . Hypertension Mother   . Heart disease Mother        Murmur and irregular heart disease  . Hypertension Father   . Cancer Cousin        breast   Social History   Substance Use Topics  . Smoking status: Former Smoker    Packs/day: 0.25    Years: 4.00    Types: Cigarettes  . Smokeless tobacco: Never Used     Comment: Cigarette use was 50 years ago  . Alcohol use Yes     Comment: Occ glass of wine with dinner   OB History    No data available     Review of Systems  Constitutional: Negative for unexpected weight change.  Musculoskeletal: Positive for arthralgias and back pain. Negative for joint swelling.  Skin: Negative for rash.    Allergies  Penicillins and Radiaplexrx [woun'dres hydrogel wound dress]  Home Medications   Prior to Admission medications   Medication Sig Start Date End Date Taking? Authorizing Provider  allopurinol (ZYLOPRIM) 300 MG tablet TAKE 1 TABLET (300 MG TOTAL) BY MOUTH DAILY. **DNF 04/06/15** 04/17/16   Dorena Cookey, MD  atenolol (TENORMIN) 25 MG tablet Take 1 tablet (25 mg total) by mouth daily. 02/03/16 05/03/16  Lelon Perla, MD  diltiazem (CARDIZEM CD) 360 MG 24 hr capsule Take 1 capsule (360 mg total) by mouth daily. 01/02/16   Crenshaw, Denice Bors, MD  ELIQUIS 5 MG TABS tablet TAKE 1 TABLET (5 MG TOTAL) BY MOUTH 2 (TWO) TIMES DAILY. 07/08/16   Crenshaw,  Denice Bors, MD  exemestane (AROMASIN) 25 MG tablet TAKE 1 TABLET (25 MG TOTAL) BY MOUTH DAILY. 04/23/16   Truitt Merle, MD  fish oil-omega-3 fatty acids 1000 MG capsule Take 1 g by mouth daily.    [provider]  HYDROcodone-acetaminophen (NORCO/VICODIN) 5-325 MG tablet Take 1-2 tablets by mouth every 4 (four) hours as needed. 09/19/16   Bjorn Pippin, PA-C  ketoconazole (NIZORAL) 2 % cream APPLY 1 APPLICATION TO AFFECTED AREA ONCE A DAY EXTERNALLY 14 DAYS AS NEEDED 09/05/15   [provider]  metFORMIN (GLUCOPHAGE) 500 MG tablet TAKE 1 TABLET (500 MG TOTAL) BY MOUTH 2 (TWO) TIMES DAILY. 06/22/16   Dorena Cookey, MD  Multiple Vitamin (MULTIVITAMIN) tablet Take 1 tablet by mouth daily.    [provider]  mupirocin ointment (BACTROBAN) 2  % Apply 1 application topically 2 (two) times daily. 08/16/15   Shawnee Knapp, MD  OVER THE COUNTER MEDICATION Take by mouth 2 (two) times daily. Presavision- for macular degeneration prevention    [provider]  predniSONE (STERAPRED UNI-PAK 21 TAB) 5 MG (21) TBPK tablet As directed 09/19/16   Bjorn Pippin, PA-C   Meds Ordered and Administered this Visit  Medications - No data to display  BP 106/63   Pulse 61   Temp 98.3 F (36.8 C)   Resp 18   SpO2 98%  No data found.   Physical Exam  Constitutional: She is oriented to person, place, and time. She appears well-developed and well-nourished. No distress.  Presents in w/c, pain with gait  HENT:  Head: Normocephalic and atraumatic.  Musculoskeletal: She exhibits tenderness. She exhibits no edema or deformity.  Difficulty with external rotation of left hip, pain to left SI region and along the IT band, no swelling or deformity, no lower leg swelling. Negative SLR and full ROM of the lumbar spine without reproduction of pain  Neurological: She is alert and oriented to person, place, and time.  Skin: Skin is warm and dry. No rash noted. She is not diaphoretic.  Psychiatric: Her behavior is normal.  Nursing note and vitals reviewed.   Urgent Care Course     Procedures (including critical care time)  Labs Review Labs Reviewed - No data to display  Imaging Review Dg Pelvis 1-2 Views  Result Date: 09/19/2016 CLINICAL DATA:  Left hip and femur pain, initial encounter EXAM: PELVIS - 1-2 VIEW COMPARISON:  05/18/2007 FINDINGS: Pelvic ring is intact. No acute fracture or dislocation is noted. Ligamentous calcification is noted adjacent to the greater trochanter on the left. This is stable from previous exam. Mild degenerative changes of the hip joints and lumbar spine are noted. IMPRESSION: No acute abnormality noted. Electronically Signed   By: Inez Catalina M.D.   On: 09/19/2016 18:21   Dg Femur Min 2 Views Left  Result  Date: 09/19/2016 CLINICAL DATA:  Worsening left hip pain after twisting injury last week. EXAM: LEFT FEMUR 2 VIEWS COMPARISON:  CT from 05/18/2007 FINDINGS: There is no evidence of fracture or other focal bone lesions. Curvilinear soft tissue calcification consistent with gluteal tendinopathy is noted. Mild joint space narrowing of the left hip and knee. No joint effusion, fracture nor bone destruction. IMPRESSION: Mild degenerative joint space narrowing of the hip and knee. No acute fracture, dislocation or bone destruction. Chronic soft tissue calcification adjacent to the greater trochanter consistent with gluteal tendinopathy. Electronically Signed   By: Ashley Royalty M.D.   On: 09/19/2016 18:24  Visual Acuity Review  Right Eye Distance:   Left Eye Distance:   Bilateral Distance:    Right Eye Near:   Left Eye Near:    Bilateral Near:         MDM   1. Left hip pain   2. Sciatica of left side    Differential includes left hip strain or IT band strain vs. Lumbar radiculopathy. Exam suggest more hip etiology. Xrays are reviewed above. Treat acutely with prednisone pack. May use tylenol as needed and for more severe pain Norco is given. Encouraged to rest and ice/heat over the weekend. If her pain continues then suggest f/u with Orthopedics for a further evaluation. She is stable for D/C.     Bjorn Pippin, [place] 09/19/16 [phone]

## 2016-09-21 ENCOUNTER — Other Ambulatory Visit (HOSPITAL_BASED_OUTPATIENT_CLINIC_OR_DEPARTMENT_OTHER): Payer: Medicare Other

## 2016-09-21 ENCOUNTER — Ambulatory Visit (HOSPITAL_BASED_OUTPATIENT_CLINIC_OR_DEPARTMENT_OTHER): Payer: Medicare Other | Admitting: Hematology

## 2016-09-21 ENCOUNTER — Telehealth: Payer: Self-pay | Admitting: Adult Health

## 2016-09-21 ENCOUNTER — Encounter: Payer: Self-pay | Admitting: Hematology

## 2016-09-21 ENCOUNTER — Telehealth: Payer: Self-pay | Admitting: Hematology

## 2016-09-21 VITALS — BP 139/75 | HR 92 | Temp 97.7°F | Resp 18 | Ht 62.0 in | Wt 142.5 lb

## 2016-09-21 DIAGNOSIS — Z170 Estrogen receptor positive status [ER+]: Secondary | ICD-10-CM | POA: Diagnosis not present

## 2016-09-21 DIAGNOSIS — M25552 Pain in left hip: Secondary | ICD-10-CM | POA: Diagnosis not present

## 2016-09-21 DIAGNOSIS — C50412 Malignant neoplasm of upper-outer quadrant of left female breast: Secondary | ICD-10-CM

## 2016-09-21 DIAGNOSIS — M8580 Other specified disorders of bone density and structure, unspecified site: Secondary | ICD-10-CM

## 2016-09-21 DIAGNOSIS — R739 Hyperglycemia, unspecified: Secondary | ICD-10-CM

## 2016-09-21 DIAGNOSIS — C50912 Malignant neoplasm of unspecified site of left female breast: Secondary | ICD-10-CM

## 2016-09-21 DIAGNOSIS — E119 Type 2 diabetes mellitus without complications: Secondary | ICD-10-CM

## 2016-09-21 LAB — COMPREHENSIVE METABOLIC PANEL
ALBUMIN: 3.9 g/dL (ref 3.5–5.0)
ALK PHOS: 85 U/L (ref 40–150)
ALT: 22 U/L (ref 0–55)
AST: 17 U/L (ref 5–34)
Anion Gap: 13 mEq/L — ABNORMAL HIGH (ref 3–11)
BILIRUBIN TOTAL: 0.67 mg/dL (ref 0.20–1.20)
BUN: 31.3 mg/dL — AB (ref 7.0–26.0)
CO2: 23 mEq/L (ref 22–29)
CREATININE: 1.4 mg/dL — AB (ref 0.6–1.1)
Calcium: 10.2 mg/dL (ref 8.4–10.4)
Chloride: 101 mEq/L (ref 98–109)
EGFR: 37 mL/min/{1.73_m2} — AB (ref >90)
GLUCOSE: 294 mg/dL — AB (ref 70–140)
Potassium: 4.4 mEq/L (ref 3.5–5.1)
SODIUM: 137 meq/L (ref 136–145)
TOTAL PROTEIN: 7.2 g/dL (ref 6.4–8.3)

## 2016-09-21 LAB — CBC WITH DIFFERENTIAL/PLATELET
BASO%: 0.3 % (ref 0.0–2.0)
BASOS ABS: 0 10*3/uL (ref 0.0–0.1)
EOS ABS: 0 10*3/uL (ref 0.0–0.5)
EOS%: 0 % (ref 0.0–7.0)
HEMATOCRIT: 40.9 % (ref 34.8–46.6)
HEMOGLOBIN: 13.4 g/dL (ref 11.6–15.9)
LYMPH%: 7.3 % — ABNORMAL LOW (ref 14.0–49.7)
MCH: 31.6 pg (ref 25.1–34.0)
MCHC: 32.9 g/dL (ref 31.5–36.0)
MCV: 96.2 fL (ref 79.5–101.0)
MONO#: 0.7 10*3/uL (ref 0.1–0.9)
MONO%: 4.7 % (ref 0.0–14.0)
NEUT#: 12.2 10*3/uL — ABNORMAL HIGH (ref 1.5–6.5)
NEUT%: 87.7 % — AB (ref 38.4–76.8)
Platelets: 226 10*3/uL (ref 145–400)
RBC: 4.25 10*6/uL (ref 3.70–5.45)
RDW: 14.1 % (ref 11.2–14.5)
WBC: 13.9 10*3/uL — ABNORMAL HIGH (ref 3.9–10.3)
lymph#: 1 10*3/uL (ref 0.9–3.3)

## 2016-09-21 NOTE — Telephone Encounter (Signed)
Gave patient avs report and appointments for November.  °

## 2016-09-21 NOTE — Telephone Encounter (Signed)
I left message for patient to return phone call.   

## 2016-09-21 NOTE — Telephone Encounter (Signed)
° ° °  Pt call to say she hurt her leg over the weekend and went to [facility] urgent care and they gave her prednisone and now her sugar is up. She would like a call back as to what she can do to get it back down     [phone]

## 2016-09-22 NOTE — Telephone Encounter (Signed)
Spoke with pt and she states that her blood sugar has been >300 since she started oral prednisone. She is on a taper and has been taking as directed. She does feel her hip pain has improved. Patient states she has already taken her morning and noon time doses today.   Tommi Rumps is not in office.  Dr.Burchette - Please advise. Thanks!

## 2016-09-22 NOTE — Telephone Encounter (Signed)
Blood sugar should improve as she comes off the prednisone.  Add Amaryl 2 mg one po qd and take ONLY until she is off the prednisone for 2 days #7 with no refills.

## 2016-09-23 NOTE — Telephone Encounter (Signed)
LMTCB

## 2016-09-24 ENCOUNTER — Other Ambulatory Visit: Payer: Self-pay | Admitting: Family Medicine

## 2016-09-24 ENCOUNTER — Telehealth: Payer: Self-pay | Admitting: Adult Health

## 2016-09-24 NOTE — Telephone Encounter (Signed)
Pt needs a refill on metformin. [facility] [address]

## 2016-09-24 NOTE — Telephone Encounter (Signed)
Rx has been refilled as directed.

## 2016-09-24 NOTE — Telephone Encounter (Signed)
Spoke with pt and she states that she has tapered the prednisone and took only 1 tablet today. She reports her blood sugar is now down to 170. She feels much improved. She does not want to take any additional medications at this time. She states that she does not want to be given oral prednisone in the future. This has been added as an intolerance to her allergy list. Nothing further needed at this time.

## 2016-09-25 MED ORDER — GLIMEPIRIDE 2 MG PO TABS: 2.0000 mg | ORAL_TABLET | Freq: Every day | ORAL | 0 refills | Status: DC

## 2016-09-25 NOTE — Telephone Encounter (Signed)
Patient called to report that her blood sugar was back up again. She did not take any prednisone today. Advised her to start Amaryl and take over weekend until blood sugar returns to normal. Call office or on-call if not improving or continues to rise. She voiced understanding. Rx sent. Nothing further needed at this time.

## 2016-09-25 NOTE — Addendum Note (Signed)
Addended by: Beckie Busing on: 09/25/2016 03:50 PM   Modules accepted: Orders

## 2016-10-26 ENCOUNTER — Ambulatory Visit (INDEPENDENT_AMBULATORY_CARE_PROVIDER_SITE_OTHER): Payer: Medicare Other | Admitting: Family Medicine

## 2016-10-26 ENCOUNTER — Encounter: Payer: Self-pay | Admitting: Family Medicine

## 2016-10-26 VITALS — BP 118/68 | HR 84 | Temp 98.0°F | Wt 141.2 lb

## 2016-10-26 DIAGNOSIS — R350 Frequency of micturition: Secondary | ICD-10-CM | POA: Diagnosis not present

## 2016-10-26 LAB — POC URINALSYSI DIPSTICK (AUTOMATED)
BILIRUBIN UA: NEGATIVE
Blood, UA: NEGATIVE
GLUCOSE UA: NEGATIVE
Ketones, UA: NEGATIVE
NITRITE UA: NEGATIVE
Protein, UA: NEGATIVE
Spec Grav, UA: 1.02 (ref 1.010–1.025)
Urobilinogen, UA: 0.2 E.U./dL
pH, UA: 6 (ref 5.0–8.0)

## 2016-10-26 NOTE — Patient Instructions (Signed)
Please drink plenty of water; enough to keep your urine pale yellow or clear. Also, I will contact you regarding your urine culture as we discussed.  Please monitor if symptoms worsen or you develop a fever, nausea, vomiting, or back pain.   Urinary Frequency, Adult Urinary frequency means urinating more often than usual. People with urinary frequency urinate at least 8 times in 24 hours, even if they drink a normal amount of fluid. Although they urinate more often than normal, the total amount of urine produced in a day may be normal. Urinary frequency is also called pollakiuria. What are the causes? This condition may be caused by:  A urinary tract infection.  Obesity.  Bladder problems, such as bladder stones.  Caffeine or alcohol.  Eating food or drinking fluids that irritate the bladder. These include coffee, tea, soda, artificial sweeteners, citrus, tomato-based foods, and chocolate.  Certain medicines, such as medicines that help the body get rid of extra fluid (diuretics).  Muscle or nerve weakness.  Overactive bladder.  Chronic diabetes.  Interstitial cystitis.  In men, problems with the prostate, such as an enlarged prostate.  In women, pregnancy.  In some cases, the cause may not be known. What increases the risk? This condition is more likely to develop in:  Women who have gone through menopause.  Men with prostate problems.  People with a disease or injury that affects the nerves or spinal cord.  People who have or have had a condition that affects the brain, such as a stroke.  What are the signs or symptoms? Symptoms of this condition include:  Feeling an urgent need to urinate often. The stress and anxiety of needing to find a bathroom quickly can make this urge worse.  Urinating 8 or more times in 24 hours.  Urinating as often as every 1 to 2 hours.  How is this diagnosed? This condition is diagnosed based on your symptoms, your medical history,  and a physical exam. You may have tests, such as:  Blood tests.  Urine tests.  Imaging tests, such as X-rays or ultrasounds.  A bladder test.  A test of your neurological system. This is the body system that senses the need to urinate.  A test to check for problems in the urethra and bladder called cystoscopy.  You may also be asked to keep a bladder diary. A bladder diary is a record of what you eat and drink, how often you urinate, and how much you urinate. You may need to see a health care provider who specializes in conditions of the urinary tract (urologist) or kidneys (nephrologist). How is this treated? Treatment for this condition depends on the cause. Sometimes the condition goes away on its own and treatment is not necessary. If treatment is needed, it may include:  Taking medicine.  Learning exercises that strengthen the muscles that help control urination.  Following a bladder training program. This may include: ? Learning to delay going to the bathroom. ? Double urinating (voiding). This helps if you are not completely emptying your bladder. ? Scheduled voiding.  Making diet changes, such as: ? Avoiding caffeine. ? Drinking fewer fluids, especially alcohol. ? Not drinking in the evening. ? Not having foods or drinks that may irritate the bladder. ? Eating foods that help prevent or ease constipation. Constipation can make this condition worse.  Having the nerves in your bladder stimulated. There are two options for stimulating the nerves to your bladder: ? Outpatient electrical nerve stimulation. This is  done by your health care provider. ? Surgery to implant a bladder pacemaker. The pacemaker helps to control the urge to urinate.  Follow these instructions at home:  Keep a bladder diary if told to by your health care provider.  Take over-the-counter and prescription medicines only as told by your health care provider.  Do any exercises as told by your health  care provider.  Follow a bladder training program as told by your health care provider.  Make any recommended diet changes.  Keep all follow-up visits as told by your health care provider. This is important. Contact a health care provider if:  You start urinating more often.  You feel pain or irritation when you urinate.  You notice blood in your urine.  Your urine looks cloudy.  You develop a fever.  You begin vomiting. Get help right away if:  You are unable to urinate. This information is not intended to replace advice given to you by your health care provider. Make sure you discuss any questions you have with your health care provider. Document Released: 02/14/2009 Document Revised: 05/22/2015 Document Reviewed: 11/14/2014 Elsevier Interactive Patient Education  [employer].

## 2016-10-26 NOTE — Progress Notes (Signed)
Patient ID: Adrienne [name], female   DOB: 1934/09/18, 82 y.o.   MRN: [id number]    PCP: [name], NP  Subjective:  Adrienne [name] is a 82 y.o. year old very pleasant female patient who presents with Urinary Tract symptoms: symptoms including urinary frequency and urgency. -started: 2 days ago, symptoms  -previous treatments: Azo has been used which provided moderate benefit. She did not take it today. -She has been monitoring her blood sugar and reports fasting this morning as 117 and notes this to be her average at least during the past month. Last A1C was 7.8 in 09/02/16, she had an increase in her blood sugar due to a short course of prednisone, however readings have decreased after this was discontinued.  ROS-denies fever, chills, sweats, N/V, flank pain,  blood in urine, vaginal discharge or pain. She states that her previous episodes have resulted in hospitalization for at least one week; however this is remote. Last symptoms were approximately 2 years ago. She is going to the beach in one week and concerned with symptoms and going out of town.  Pertinent Past Medical History-Diabetes, HTN, Afib, atrophic vaginitis   Medications- reviewed  Current Outpatient Prescriptions  Medication Sig Dispense Refill  . allopurinol (ZYLOPRIM) 300 MG tablet TAKE 1 TABLET (300 MG TOTAL) BY MOUTH DAILY. **DNF 04/06/79** 90 tablet 3  . atenolol (TENORMIN) 25 MG tablet Take 1 tablet (25 mg total) by mouth daily. 30 tablet 12  . diltiazem (CARDIZEM CD) 360 MG 24 hr capsule Take 1 capsule (360 mg total) by mouth daily. 90 capsule 3  . ELIQUIS 5 MG TABS tablet TAKE 1 TABLET (5 MG TOTAL) BY MOUTH 2 (TWO) TIMES DAILY. 180 tablet 1  . exemestane (AROMASIN) 25 MG tablet TAKE 1 TABLET (25 MG TOTAL) BY MOUTH DAILY. 90 tablet 3  . fish oil-omega-3 fatty acids 1000 MG capsule Take 1 g by mouth daily.    Marland Kitchen glimepiride (AMARYL) 2 MG tablet Take 1 tablet (2 mg total) by mouth daily with breakfast. 7 tablet 0  .  HYDROcodone-acetaminophen (NORCO/VICODIN) 5-325 MG tablet Take 1-2 tablets by mouth every 4 (four) hours as needed. (Patient not taking: Reported on 09/21/2016) 10 tablet 0  . ketoconazole (NIZORAL) 2 % cream APPLY 1 APPLICATION TO AFFECTED AREA ONCE A DAY EXTERNALLY 14 DAYS AS NEEDED  0  . metFORMIN (GLUCOPHAGE) 500 MG tablet TAKE 1 TABLET (500 MG TOTAL) BY MOUTH 2 (TWO) TIMES DAILY. 180 tablet 0  . Multiple Vitamin (MULTIVITAMIN) tablet Take 1 tablet by mouth daily.    . mupirocin ointment (BACTROBAN) 2 % Apply 1 application topically 2 (two) times daily. 30 g 0  . OVER THE COUNTER MEDICATION Take by mouth 2 (two) times daily. Presavision- for macular degeneration prevention    . predniSONE (STERAPRED UNI-PAK 21 TAB) 5 MG (21) TBPK tablet As directed 21 tablet 0   No current facility-administered medications for this visit.     Objective: BP 118/68 (BP Location: Left Arm, Patient Position: Sitting, Cuff Size: Normal)   Pulse 84   Temp 98 F (36.7 C) (Oral)   Wt 141 lb 3.2 oz (64 kg)   SpO2 96%   BMI 25.83 kg/m  Gen: NAD, resting comfortably HEENT: Oropharynx clear and moist CV: RRR no murmurs rubs or gallops Lungs: CTAB no crackles, wheeze, rhonchi Abdomen: soft/nontender/nondistended/normal bowel sounds. No rebound or guarding.  No CVA tenderness.   Suprapubic tenderness not present  Ext: no edema Skin: warm, dry, no rash  Neuro: grossly normal, moves all extremities  Assessment/Plan: 1. Urinary frequency UA indicates trace of leukocytes; negative for nitrites and blood; will send for culture as UA does not indicate true UTI and may be related to atrophic vaginitis; GFR is 37 and we discussed the risks/benefits of empiric therapy. Exam is reassuring and she states that she is seeking care as she will be going out of town on Sunday. Opted to wait for urine culture results to evaluate further, prior to treatment. Advised her to follow up if symptoms do not improve with treatment,  worsen, or she develops fever >100, N/V, or back pain. She voiced understanding and agreed with plan. - POCT Urinalysis Dipstick (Automated)   Finally, we reviewed reasons to return to care including if symptoms worsen or persist or new concerns arise- once again particularly fever, N/V, or flank pain.   Laurita Quint, FNP

## 2016-10-27 LAB — URINE CULTURE

## 2016-12-01 NOTE — Progress Notes (Signed)
HPI: FU atrial fibrillation. Echo 12/16 showed EF 50, mild biatrial enlargement, mild MR. TSH 11/16-3.67. Holter repeated September 2017 and showed rate was controlled. Since last seen, patient denies dyspnea, chest pain, palpitations or bleeding.   Current Outpatient Prescriptions  Medication Sig Dispense Refill  . allopurinol (ZYLOPRIM) 300 MG tablet TAKE 1 TABLET (300 MG TOTAL) BY MOUTH DAILY. **DNF 04/06/15** 90 tablet 3  . diltiazem (CARDIZEM CD) 360 MG 24 hr capsule Take 1 capsule (360 mg total) by mouth daily. 90 capsule 3  . ELIQUIS 5 MG TABS tablet TAKE 1 TABLET (5 MG TOTAL) BY MOUTH 2 (TWO) TIMES DAILY. 180 tablet 1  . exemestane (AROMASIN) 25 MG tablet TAKE 1 TABLET (25 MG TOTAL) BY MOUTH DAILY. 90 tablet 3  . fish oil-omega-3 fatty acids 1000 MG capsule Take 1 g by mouth daily.    Marland Kitchen glimepiride (AMARYL) 2 MG tablet Take 1 tablet (2 mg total) by mouth daily with breakfast. 7 tablet 0  . HYDROcodone-acetaminophen (NORCO/VICODIN) 5-325 MG tablet Take 1-2 tablets by mouth every 4 (four) hours as needed. 10 tablet 0  . ketoconazole (NIZORAL) 2 % cream APPLY 1 APPLICATION TO AFFECTED AREA ONCE A DAY EXTERNALLY 14 DAYS AS NEEDED  0  . metFORMIN (GLUCOPHAGE) 500 MG tablet TAKE 1 TABLET (500 MG TOTAL) BY MOUTH 2 (TWO) TIMES DAILY. 180 tablet 0  . Multiple Vitamin (MULTIVITAMIN) tablet Take 1 tablet by mouth daily.    . mupirocin ointment (BACTROBAN) 2 % Apply 1 application topically 2 (two) times daily. 30 g 0  . OVER THE COUNTER MEDICATION Take by mouth 2 (two) times daily. Presavision- for macular degeneration prevention    . predniSONE (STERAPRED UNI-PAK 21 TAB) 5 MG (21) TBPK tablet As directed 21 tablet 0  . atenolol (TENORMIN) 25 MG tablet Take 1 tablet (25 mg total) by mouth daily. 30 tablet 12   No current facility-administered medications for this visit.      Past Medical History:  Diagnosis Date  . Allergy   . Atrial fibrillation ([place])   . Breast cancer ([place])  08/25/12   Invasive ductal ca,DCIS  . Diabetes mellitus    type II  . Gout   . Hearing loss   . History of frequent urinary tract infections   . Hx of radiation therapy 11/22/12- 12/19/12   breast 4250 cGy 17 sessions, left breast boost 750 cGy 3 sessions  . Hyperlipidemia   . Hypertension   . Osteopenia     Past Surgical History:  Procedure Laterality Date  . ABDOMINAL HYSTERECTOMY Bilateral 1999   w/b/l salpingo-oopherectomy  . APPENDECTOMY    . BREAST LUMPECTOMY WITH NEEDLE LOCALIZATION AND AXILLARY SENTINEL LYMPH NODE BX Left 09/12/2012   Procedure: LEFT NEEDLE LOCALIZATION BREAST LUMPECTOMY TION AND AXILLARY SENTINEL LYMPH NODE BX;  Surgeon: Edward Jolly, MD;  Location: [place];  Service: General;  Laterality: Left;  . BREAST SURGERY  1990's   right- fibroid cyst  . CHOLECYSTECTOMY    . SEPTOPLASTY    . TONSILLECTOMY      Social History   Social History  . Marital status: Married    Spouse name: N/A  . Number of children: 3  . Years of education: N/A   Occupational History  . Not on file.   Social History Main Topics  . Smoking status: Former Smoker    Packs/day: 0.25    Years: 4.00    Types: Cigarettes  . Smokeless tobacco: Never Used  Comment: Cigarette use was 50 years ago  . Alcohol use Yes     Comment: Occ glass of wine with dinner  . Drug use: No  . Sexual activity: No     Comment: menarche age 48, 67st birth age 23, G95, HRT x 7 years?   Other Topics Concern  . Not on file   Social History Narrative   Married - husband in memory care unit    Three children ( One lives at [place] and two in [place]      She likes to read and go to beach     Family History  Problem Relation Age of Onset  . Hypertension Mother   . Heart disease Mother        Murmur and irregular heart disease  . Hypertension Father   . Cancer Cousin        breast    ROS: no fevers or chills, productive cough, hemoptysis, dysphasia, odynophagia, melena,  hematochezia, dysuria, hematuria, rash, seizure activity, orthopnea, PND, pedal edema, claudication. Remaining systems are negative.  Physical Exam: Well-developed well-nourished in no acute distress.  Skin is warm and dry.  HEENT is normal.  Neck is supple.  Chest is clear to auscultation with normal expansion.  Cardiovascular exam is irregular Abdominal exam nontender or distended. No masses palpated. Extremities show no edema. neuro grossly intact  ECG- atrial fibrillation at a rate of 89. Left ventricular hypertrophy. Nonspecific ST changes. personally reviewed  A/P  1 permanent atrial fibrillation-patient is doing well with no symptoms. Continue Cardizem and atenolol for rate control. Continue apixaban.   2 hypertension-blood pressure is controlled. Continue present medications.  3 hyperlipidemia-managed by primary care.  Kirk Ruths, MD

## 2016-12-03 ENCOUNTER — Ambulatory Visit (INDEPENDENT_AMBULATORY_CARE_PROVIDER_SITE_OTHER): Payer: Medicare Other | Admitting: Cardiology

## 2016-12-03 ENCOUNTER — Other Ambulatory Visit: Payer: Self-pay | Admitting: *Deleted

## 2016-12-03 ENCOUNTER — Encounter: Payer: Self-pay | Admitting: Cardiology

## 2016-12-03 VITALS — BP 140/74 | HR 89 | Ht 62.0 in | Wt 139.0 lb

## 2016-12-03 DIAGNOSIS — I482 Chronic atrial fibrillation: Secondary | ICD-10-CM | POA: Diagnosis not present

## 2016-12-03 DIAGNOSIS — I4821 Permanent atrial fibrillation: Secondary | ICD-10-CM

## 2016-12-03 DIAGNOSIS — E7800 Pure hypercholesterolemia, unspecified: Secondary | ICD-10-CM

## 2016-12-03 DIAGNOSIS — I10 Essential (primary) hypertension: Secondary | ICD-10-CM

## 2016-12-03 DIAGNOSIS — I480 Paroxysmal atrial fibrillation: Secondary | ICD-10-CM

## 2016-12-03 MED ORDER — APIXABAN 5 MG PO TABS: 5.0000 mg | ORAL_TABLET | Freq: Two times a day (BID) | ORAL | 3 refills | Status: DC

## 2016-12-03 NOTE — Patient Instructions (Signed)
Your physician wants you to follow-up in: 6 MONTHS WITH DR CRENSHAW You will receive a reminder letter in the mail two months in advance. If you don't receive a letter, please call our office to schedule the follow-up appointment.   If you need a refill on your cardiac medications before your next appointment, please call your pharmacy.  

## 2016-12-08 ENCOUNTER — Other Ambulatory Visit: Payer: Self-pay | Admitting: Family Medicine

## 2016-12-09 NOTE — Telephone Encounter (Signed)
Spoke with pt states that she has enough refills on her test strips and doesn't need any refills at this time

## 2016-12-09 NOTE — Telephone Encounter (Signed)
Attempted to call patient left a message to return my call in the office in regards to her Diabetic supply refill

## 2016-12-21 ENCOUNTER — Other Ambulatory Visit: Payer: Self-pay | Admitting: Adult Health

## 2016-12-22 NOTE — Telephone Encounter (Signed)
Sent to the pharmacy by e-scribe for 90 days.  Pt has upcoming appt with Tommi Rumps on 12/30/16

## 2016-12-30 ENCOUNTER — Encounter: Payer: Self-pay | Admitting: Adult Health

## 2016-12-30 ENCOUNTER — Ambulatory Visit (INDEPENDENT_AMBULATORY_CARE_PROVIDER_SITE_OTHER): Payer: Medicare Other | Admitting: Adult Health

## 2016-12-30 VITALS — BP 122/70 | Temp 98.2°F | Ht 62.0 in | Wt 136.0 lb

## 2016-12-30 DIAGNOSIS — E109 Type 1 diabetes mellitus without complications: Secondary | ICD-10-CM | POA: Diagnosis not present

## 2016-12-30 DIAGNOSIS — E139 Other specified diabetes mellitus without complications: Secondary | ICD-10-CM

## 2016-12-30 LAB — POCT GLYCOSYLATED HEMOGLOBIN (HGB A1C): Hemoglobin A1C: 7

## 2016-12-30 MED ORDER — METFORMIN HCL 500 MG PO TABS: 500.0000 mg | ORAL_TABLET | Freq: Two times a day (BID) | ORAL | 3 refills | Status: DC

## 2016-12-30 NOTE — Progress Notes (Signed)
Subjective:    Patient ID: Adrienne [name], female    DOB: 1935/02/09, 82 y.o.   MRN: [id number]  HPI  82 year old female who  has a past medical history of Allergy; Atrial fibrillation ([place]); Breast cancer ([place]) (08/25/12); Diabetes mellitus; Gout; Hearing loss; History of frequent urinary tract infections; radiation therapy (11/22/12- 12/19/12); Hyperlipidemia; Hypertension; and Osteopenia.  She presents to the office today for three month follow up regarding diabetes. Her last A1c was 7.8 on 09/02/2016. She is currently maintained on Metformin 500 mg BID and Amaryl 2 mg daily ( she was taking Amaryl while she was taking Prednisone due to increased blood sugar readings ).   Today in the office she reports that her blood sugars have been 120 or below but she has not had any episodes of hypoglycemia.   Over all she is feeling well and has no acute complaints   Review of Systems See HPI   Past Medical History:  Diagnosis Date  . Allergy   . Atrial fibrillation ([place])   . Breast cancer ([place]) 08/25/12   Invasive ductal ca,DCIS  . Diabetes mellitus    type II  . Gout   . Hearing loss   . History of frequent urinary tract infections   . Hx of radiation therapy 11/22/12- 12/19/12   breast 4250 cGy 17 sessions, left breast boost 750 cGy 3 sessions  . Hyperlipidemia   . Hypertension   . Osteopenia     Social History   Social History  . Marital status: Married    Spouse name: N/A  . Number of children: 3  . Years of education: N/A   Occupational History  . Not on file.   Social History Main Topics  . Smoking status: Former Smoker    Packs/day: 0.25    Years: 4.00    Types: Cigarettes  . Smokeless tobacco: Never Used     Comment: Cigarette use was 50 years ago  . Alcohol use Yes     Comment: Occ glass of wine with dinner  . Drug use: No  . Sexual activity: No     Comment: menarche age 72, 11st birth age 41, G5, HRT x 7 years?   Other Topics Concern  . Not on file   Social  History Narrative   Married - husband in memory care unit    Three children ( One lives at [place] and two in [place]      She likes to read and go to beach     Past Surgical History:  Procedure Laterality Date  . ABDOMINAL HYSTERECTOMY Bilateral 1999   w/b/l salpingo-oopherectomy  . APPENDECTOMY    . BREAST LUMPECTOMY WITH NEEDLE LOCALIZATION AND AXILLARY SENTINEL LYMPH NODE BX Left 09/12/2012   Procedure: LEFT NEEDLE LOCALIZATION BREAST LUMPECTOMY TION AND AXILLARY SENTINEL LYMPH NODE BX;  Surgeon: Edward Jolly, MD;  Location: [facility];  Service: General;  Laterality: Left;  . BREAST SURGERY  1990's   right- fibroid cyst  . CHOLECYSTECTOMY    . SEPTOPLASTY    . TONSILLECTOMY      Family History  Problem Relation Age of Onset  . Hypertension Mother   . Heart disease Mother        Murmur and irregular heart disease  . Hypertension Father   . Cancer Cousin        breast    Allergies  Allergen Reactions  . Penicillins Hives and Swelling  . Prednisone Other (See Comments)  ELEVATED BLOOD SUGAR  . Radiaplexrx [Woun'Dres Hydrogel Wound Dress] Hives    Current Outpatient Prescriptions on File Prior to Visit  Medication Sig Dispense Refill  . allopurinol (ZYLOPRIM) 300 MG tablet TAKE 1 TABLET (300 MG TOTAL) BY MOUTH DAILY. **DNF 04/06/15** 90 tablet 3  . apixaban (ELIQUIS) 5 MG TABS tablet Take 1 tablet (5 mg total) by mouth 2 (two) times daily. 180 tablet 3  . diltiazem (CARDIZEM CD) 360 MG 24 hr capsule Take 1 capsule (360 mg total) by mouth daily. 90 capsule 3  . exemestane (AROMASIN) 25 MG tablet TAKE 1 TABLET (25 MG TOTAL) BY MOUTH DAILY. 90 tablet 3  . fish oil-omega-3 fatty acids 1000 MG capsule Take 1 g by mouth daily.    . metFORMIN (GLUCOPHAGE) 500 MG tablet TAKE 1 TABLET BY MOUTH TWICE A DAY 180 tablet 0  . Multiple Vitamin (MULTIVITAMIN) tablet Take 1 tablet by mouth daily.    Marland Kitchen OVER THE COUNTER MEDICATION Take by mouth 2 (two) times daily. Presavision-  for macular degeneration prevention    . atenolol (TENORMIN) 25 MG tablet Take 1 tablet (25 mg total) by mouth daily. 30 tablet 12  . glimepiride (AMARYL) 2 MG tablet Take 1 tablet (2 mg total) by mouth daily with breakfast. (Patient not taking: Reported on 12/30/2016) 7 tablet 0   No current facility-administered medications on file prior to visit.     BP 122/70 (BP Location: Left Arm)   Temp 98.2 F (36.8 C) (Oral)   Ht 5\' 2"  (1.575 m)   Wt 136 lb (61.7 kg)   BMI 24.87 kg/m       Objective:   Physical Exam  Constitutional: She is oriented to person, place, and time. She appears well-developed. No distress.  Cardiovascular: Normal rate, normal heart sounds and intact distal pulses.  An irregularly irregular rhythm present. Exam reveals no gallop and no friction rub.   No murmur heard. Pulmonary/Chest: Effort normal and breath sounds normal. No respiratory distress. She has no wheezes. She has no rales. She exhibits no tenderness.  Neurological: She is alert and oriented to person, place, and time.  Skin: Skin is warm and dry. No rash noted. No erythema. No pallor.  Psychiatric: She has a normal mood and affect. Her behavior is normal. Judgment and thought content normal.  Nursing note and vitals reviewed.     Assessment & Plan:  1. Diabetes 1.5, managed as type 2 ([place]) - POCT A1C- has improved from 7.8  - metFORMIN (GLUCOPHAGE) 500 MG tablet; Take 1 tablet (500 mg total) by mouth 2 (two) times daily.  Dispense: 180 tablet; Refill: 3 - Continue current therapy and follow up at annual exam   [name], NP

## 2017-01-07 ENCOUNTER — Other Ambulatory Visit: Payer: Self-pay | Admitting: Cardiology

## 2017-02-24 ENCOUNTER — Other Ambulatory Visit: Payer: Self-pay | Admitting: Cardiology

## 2017-03-08 ENCOUNTER — Other Ambulatory Visit: Payer: Self-pay | Admitting: *Deleted

## 2017-03-08 DIAGNOSIS — C50512 Malignant neoplasm of lower-outer quadrant of left female breast: Secondary | ICD-10-CM

## 2017-03-08 MED ORDER — EXEMESTANE 25 MG PO TABS: 25.0000 mg | ORAL_TABLET | Freq: Every day | ORAL | 3 refills | Status: DC

## 2017-03-22 ENCOUNTER — Telehealth: Payer: Self-pay

## 2017-03-22 ENCOUNTER — Encounter: Payer: Self-pay | Admitting: Nurse Practitioner

## 2017-03-22 ENCOUNTER — Ambulatory Visit (HOSPITAL_BASED_OUTPATIENT_CLINIC_OR_DEPARTMENT_OTHER): Payer: Medicare Other | Admitting: Nurse Practitioner

## 2017-03-22 ENCOUNTER — Other Ambulatory Visit (HOSPITAL_BASED_OUTPATIENT_CLINIC_OR_DEPARTMENT_OTHER): Payer: Medicare Other

## 2017-03-22 VITALS — BP 154/89 | HR 88 | Temp 97.5°F | Resp 18 | Ht 62.0 in | Wt 139.7 lb

## 2017-03-22 DIAGNOSIS — E119 Type 2 diabetes mellitus without complications: Secondary | ICD-10-CM | POA: Diagnosis not present

## 2017-03-22 DIAGNOSIS — C50412 Malignant neoplasm of upper-outer quadrant of left female breast: Secondary | ICD-10-CM

## 2017-03-22 DIAGNOSIS — R739 Hyperglycemia, unspecified: Secondary | ICD-10-CM | POA: Diagnosis not present

## 2017-03-22 DIAGNOSIS — C50912 Malignant neoplasm of unspecified site of left female breast: Secondary | ICD-10-CM

## 2017-03-22 DIAGNOSIS — Z170 Estrogen receptor positive status [ER+]: Secondary | ICD-10-CM

## 2017-03-22 DIAGNOSIS — M8580 Other specified disorders of bone density and structure, unspecified site: Secondary | ICD-10-CM | POA: Diagnosis not present

## 2017-03-22 DIAGNOSIS — K5900 Constipation, unspecified: Secondary | ICD-10-CM | POA: Diagnosis not present

## 2017-03-22 LAB — COMPREHENSIVE METABOLIC PANEL
ALT: 24 U/L (ref 0–55)
AST: 19 U/L (ref 5–34)
Albumin: 3.9 g/dL (ref 3.5–5.0)
Alkaline Phosphatase: 84 U/L (ref 40–150)
Anion Gap: 8 mEq/L (ref 3–11)
BUN: 23.8 mg/dL (ref 7.0–26.0)
CALCIUM: 10.1 mg/dL (ref 8.4–10.4)
CHLORIDE: 105 meq/L (ref 98–109)
CO2: 25 meq/L (ref 22–29)
Creatinine: 1.2 mg/dL — ABNORMAL HIGH (ref 0.6–1.1)
EGFR: 42 mL/min/{1.73_m2} — ABNORMAL LOW (ref >60)
Glucose: 140 mg/dl (ref 70–140)
POTASSIUM: 4.4 meq/L (ref 3.5–5.1)
SODIUM: 139 meq/L (ref 136–145)
Total Bilirubin: 0.44 mg/dL (ref 0.20–1.20)
Total Protein: 7 g/dL (ref 6.4–8.3)

## 2017-03-22 LAB — CBC WITH DIFFERENTIAL/PLATELET
BASO%: 0.8 % (ref 0.0–2.0)
BASOS ABS: 0.1 10*3/uL (ref 0.0–0.1)
EOS%: 1.7 % (ref 0.0–7.0)
Eosinophils Absolute: 0.1 10*3/uL (ref 0.0–0.5)
HEMATOCRIT: 44 % (ref 34.8–46.6)
HGB: 14.6 g/dL (ref 11.6–15.9)
LYMPH%: 18.8 % (ref 14.0–49.7)
MCH: 31.7 pg (ref 25.1–34.0)
MCHC: 33.2 g/dL (ref 31.5–36.0)
MCV: 95.6 fL (ref 79.5–101.0)
MONO#: 0.8 10*3/uL (ref 0.1–0.9)
MONO%: 10.4 % (ref 0.0–14.0)
NEUT#: 5 10*3/uL (ref 1.5–6.5)
NEUT%: 68.3 % (ref 38.4–76.8)
Platelets: 169 10*3/uL (ref 145–400)
RBC: 4.6 10*6/uL (ref 3.70–5.45)
RDW: 14.5 % (ref 11.2–14.5)
WBC: 7.3 10*3/uL (ref 3.9–10.3)
lymph#: 1.4 10*3/uL (ref 0.9–3.3)

## 2017-03-22 NOTE — Telephone Encounter (Signed)
Printed avs and calender for upcoming appointment. Per 11/19 los

## 2017-03-22 NOTE — Progress Notes (Signed)
[facility]  Telephone:(336) [phone] Fax:(336) [phone]  Clinic Follow up Note   Patient Care Team: [name], NP as PCP - General (Family Medicine) 03/22/2017  SUMMARY OF ONCOLOGIC HISTORY: Oncology History   Breast cancer, left breast   Staging form: Breast, AJCC 7th Edition     Clinical stage from 09/12/2012: Stage IA (T1b, N0, M0) - Unsigned Breast cancer   Staging form: Breast, AJCC 7th Edition     Clinical stage from 09/29/2012: Stage IA (T1, N0, cM0) - Unsigned     Pathologic: No stage assigned - Unsigned       Breast cancer, left breast (HCC)   08/25/2012 Receptors her2    ER 100% positive, PR 88% positive, HER-2 negative, Ki-67 16%      08/30/2012 Initial Diagnosis    Breast cancer, left breast      09/12/2012 Pathology Results    T1bN0 Grade 1 invasive ductal carcinoma, and DCIS.      09/12/2012 Surgery    Left breast lumpectomy and sentinel lymph node biopsy, negative margins.      11/09/2012 - 12/13/2012 Radiation Therapy    Adjuvant breast radiation      12/2012 -  Anti-estrogen oral therapy    Anastrozole 1 mg daily, switched to Aromasin 25 mg daily in Jan 2015 due to diarrhea.      09/01/2016 Imaging    [name] Outside films Breast form [facility] 09/01/2016 IMPRESSION: Probably Benign 1. No significant change in oval hypoechoic masses in the left breast at 11:00 2 cm from the nipple and 10:30 4 cm from the nipple. Both of these masses resemble the area of fat necrosis previously biopsies at 2:00. In addition, both masses have developing central benign or dystrophic appearing calcifications and are favored to be other areas of fat necrosis. A 6 month follow-up left breast mammogram and ultrasound is recommended.      09/01/2016 Mammogram    [facility] Mammogram from [facility] 09/01/16 IMPRESSION  Incomplete - additional imaging evaluation needed Ultrasound of the upper medial left breast mass is recommended.       03/09/2017 Mammogram    Previous lumpectomy  changes in the upper outer left breast anterior depth with stable associated dystrophic calcifications.  Biopsy clip at the 2:00 in the left breast near the lumpectomy site corresponding to previously biopsied benign fat necrosis.  Persistent round mass in the upper medial left breast with benign-appearing central round calcification.  No significant masses, calcifications, or other findings are seen in the breast  Impression: ultrasound is recommended for the left breast      03/09/2017 Breast US    Left breast ultrasound impression:  No significant change in oval hypoechoic masses in the left breast at 11:00 2 cm from the nipple and 10:30 4 cm from the nipple.  Both of these masses resemble the area of fat necrosis previously biopsied at 2:00.  In addition, both masses have developing central benign or dystrophic appearing calcifications and are favored to be other areas of fat necrosis.  A 64-monthfollow-up bilateral mammogram and left breast ultrasound is recommended.     CURRENT THERAPY: Aromasin daily  INTERVAL HISTORY: Ms. SSchubachreturns today for follow-up as scheduled.  [facility] She continues Aromasin daily, tolerating well. Denies bone or joint aches. Exercises 5 times per week. Mild constipation intermittently, does not require medication. [facility]. Denies breast  changes or new concerns.   REVIEW OF SYSTEMS:   Constitutional: Denies fatigue, fevers, chills or abnormal weight loss (+) hair loss Eyes: Denies blurriness of vision Ears, nose, mouth, throat, and face: Denies mucositis or sore throat Respiratory: Denies cough, dyspnea or wheezes Cardiovascular: Denies palpitation, chest discomfort or lower extremity swelling Gastrointestinal:  Denies nausea, vomiting, diarrhea, heartburn or change in bowel habits (+) mild constipation, last BM 1-2 days ago with no medication  required Skin: Denies abnormal skin rashes Lymphatics: Denies new lymphadenopathy or easy bruising Neurological:Denies numbness, tingling or new weaknesses Behavioral/Psych: Mood is stable, no new changes  Breasts: Denies nipple  discharge, skin changes, palpable lump or mass. (+) Left breast with nipple inversion All other systems were reviewed with the patient and are negative.  MEDICAL HISTORY:  Past Medical History:  Diagnosis Date  . Allergy   . Atrial fibrillation ([place])   . Breast cancer ([place]) 08/25/12   Invasive ductal ca,DCIS  . Diabetes mellitus    type II  . Gout   . Hearing loss   . History of frequent urinary tract infections   . Hx of radiation therapy 11/22/12- 12/19/12   breast 4250 cGy 17 sessions, left breast boost 750 cGy 3 sessions  . Hyperlipidemia   . Hypertension   . Osteopenia     SURGICAL HISTORY: Past Surgical History:  Procedure Laterality Date  . ABDOMINAL HYSTERECTOMY Bilateral 1999   w/b/l salpingo-oopherectomy  . APPENDECTOMY    . BREAST SURGERY  1990's   right- fibroid cyst  . CHOLECYSTECTOMY    . LEFT NEEDLE LOCALIZATION BREAST LUMPECTOMY TION AND AXILLARY SENTINEL LYMPH NODE BX Left 09/12/2012   Performed by Edward Jolly, MD at [facility]  . SEPTOPLASTY    . TONSILLECTOMY      I have reviewed the social history and family history with the patient and they are unchanged from previous note.  ALLERGIES:  is allergic to penicillins; prednisone; and radiaplexrx [woun'dres hydrogel wound dress].  MEDICATIONS:  Current Outpatient Medications  Medication Sig Dispense Refill  . allopurinol (ZYLOPRIM) 300 MG tablet TAKE 1 TABLET (300 MG TOTAL) BY MOUTH DAILY. **DNF 04/06/15** 90 tablet 3  . apixaban (ELIQUIS) 5 MG TABS tablet Take 1 tablet (5 mg total) by mouth 2 (two) times daily. 180 tablet 3  . atenolol (TENORMIN) 25 MG tablet TAKE 1 TABLET (25 MG TOTAL) BY MOUTH DAILY. 30 tablet 10  . chlorpheniramine (CHLOR-TRIMETON) 4 MG tablet Take 4 mg  2 (two) times daily as needed by mouth for allergies.    Marland Kitchen diltiazem (CARDIZEM CD) 360 MG 24 hr capsule TAKE 1 CAPSULE (360 MG TOTAL) BY MOUTH DAILY. 90 capsule 3  . exemestane (AROMASIN) 25 MG tablet Take 1 tablet (25 mg total) daily by mouth. 90 tablet 3  . fish oil-omega-3 fatty acids 1000 MG capsule Take 1 g by mouth daily.    . metFORMIN (GLUCOPHAGE) 500 MG tablet Take 1 tablet (500 mg total) by mouth 2 (two) times daily. 180 tablet 3  . Multiple Vitamin (MULTIVITAMIN) tablet Take 1 tablet by mouth daily.    Marland Kitchen OVER THE COUNTER MEDICATION Take by mouth 2 (two) times daily. Presavision- for macular degeneration prevention     No current facility-administered medications for this visit.     PHYSICAL EXAMINATION: ECOG PERFORMANCE STATUS: 0 - Asymptomatic  Vitals:   03/22/17 1024  BP: (!) 154/89  Pulse: 88  Resp: 18  Temp: (!) 97.5 F (36.4 C)  SpO2: 98%  Filed Weights   03/22/17 1024  Weight: 139 lb 11.2 oz (63.4 kg)    GENERAL:alert, no distress and comfortable SKIN: skin color, texture, turgor are normal, no rashes or significant lesions EYES: normal, Conjunctiva are pink and non-injected, sclera clear OROPHARYNX:no exudate, no erythema and lips, buccal mucosa, and tongue normal  NECK: supple, thyroid normal size, non-tender, without nodularity LYMPH:  no palpable cervical, supraclavicular, axillary lymphadenopathy  LUNGS: clear to auscultation laterally with normal breathing effort HEART: regular rate & rhythm and no murmurs and no lower extremity edema ABDOMEN:abdomen soft, non-tender and normal bowel sounds Musculoskeletal:no cyanosis of digits and no clubbing  Breasts: Inspection shows them to be symmetrical without nipple discharge.  No palpable masses to right breast or axilla.  Left breast status post lumpectomy, exam reveals nipple inversion; no nodularity or masses in left breast tissue or axilla NEURO: alert & oriented x 3 with fluent speech, no focal  motor/sensory deficits  LABORATORY DATA:  I have reviewed the data as listed CBC Latest Ref Rng & Units 03/22/2017 09/21/2016 03/24/2016  WBC 3.9 - 10.3 10e3/uL 7.3 13.9(H) 7.0  Hemoglobin 11.6 - 15.9 g/dL 14.6 13.4 14.3  Hematocrit 34.8 - 46.6 % 44.0 40.9 43.0  Platelets 145 - 400 10e3/uL 169 226 192     CMP Latest Ref Rng & Units 03/22/2017 09/21/2016 03/24/2016  Glucose 70 - 140 mg/dl 140 294(H) 145(H)  BUN 7.0 - 26.0 mg/dL 23.8 31.3(H) 32.3(H)  Creatinine 0.6 - 1.1 mg/dL 1.2(H) 1.4(H) 1.4(H)  Sodium 136 - 145 mEq/L 139 137 142  Potassium 3.5 - 5.1 mEq/L 4.4 4.4 4.5  Chloride 96 - 112 mEq/L - - -  CO2 22 - 29 mEq/L _0 Calcium 8.4 - 10.4 mg/dL 10.1 10.2 10.3  Total Protein 6.4 - 8.3 g/dL 7.0 7.2 7.2  Total Bilirubin 0.20 - 1.20 mg/dL 0.44 0.67 0.46  Alkaline Phos 40 - 150 U/L 84 85 105  AST 5 - 34 U/L _1 ALT 0 - 55 U/L 24 22 35    RADIOGRAPHIC STUDIES: I have personally reviewed the radiological images as listed and agreed with the findings in the report. No results found.   03/09/2017 left breast mammogram  previous lumpectomy changes in the upper outer left breast anterior depth with stable associated dystrophic calcifications.  Biopsy clip at the 2:00 in the left breast near the lumpectomy site corresponding to previously biopsied benign fat necrosis.  Persistent round mass in the upper medial left breast with benign-appearing central round calcification.  No significant masses, calcifications, or other findings are seen in the breast  Impression: ultrasound is recommended for the left breast  Left breast ultrasound impression:  No significant change in oval hypoechoic masses in the left breast at 11:00 2 cm from the nipple and 10:30 4 cm from the nipple.  Both of these masses resemble the area of fat necrosis previously biopsied at 2:00.  In addition, both masses have developing central benign or dystrophic appearing calcifications and are favored to be other  areas of fat necrosis.   ASSESSMENT & PLAN:  82 y.o. [place], [place] woman   1. pT1bN0, stage IA invasive ductal carcinoma of the left breast, grade I, ER 100%, PR 88%, Ki-67 16%, HER-2/neu negative. 2. Osteopenia 3. Hypercalcemia 4. DM, hyperglycemia  Ms. Neal appears stable today, tolerating Aromasin well overall.  Left breast mammogram and ultrasound on 03/09/2017 reveal hypoechoic masses that have developed central benign or dystrophic appearing calcifications and are  favored to be areas of fat necrosis.  Follow-up mammogram and ultrasound in 6 months was recommended, order placed today.  We reviewed these results. CBC unremarkable; Cmet with elevated creatinine to 1.2; I recommend she increase water intake. She has osteopenia, still not interested in prolia injection due to side effects; she was previously instructed to hold calcium for hypercalcemia and continue vitamin D. She has been off vitamin D lately, I recommend she restart supplementation and continue daily exercise to include light weight bearing exercises. She is clinically doing well, physical exam unremarkable, left nipple is inverted at baseline, no clinical concern for recurrence. She will continue aromasin daily, until 11/2017. Return in 6 months for lab and MD f/u.   PLAN: Restart vitamin D, continue weight bearing exercises.  Diagnostic mammo and left breast US in 6 months at [facility], order placed today Lab and MD f/u in 6 months Continue aromasin daily until ~11/2017 per Dr. Burr Medico    Orders Placed This Encounter  Procedures  . MM DIAG BREAST TOMO BILATERAL    Standing Status:   Future    Standing Expiration Date:   03/22/2018    Order Specific Question:   Reason for Exam (SYMPTOM  OR DIAGNOSIS REQUIRED)    Answer:   h/o left breast cancer on aromasin therapy    Order Specific Question:   Preferred imaging location?    Answer:   External    Comments:   [facility]  . US BREAST LTD UNI LEFT INC AXILLA    Standing Status:    Future    Standing Expiration Date:   05/22/2018    Order Specific Question:   Reason for Exam (SYMPTOM  OR DIAGNOSIS REQUIRED)    Answer:   h/o left breast cancer on aromasin therapy    Order Specific Question:   Preferred imaging location?    Answer:   External    Comments:   [facility]   All questions were answered. The patient knows to call the clinic with any problems, questions or concerns. No barriers to learning was detected.    Alla Feeling, NP 03/22/17

## 2017-03-30 LAB — HM DIABETES EYE EXAM

## 2017-04-02 ENCOUNTER — Other Ambulatory Visit: Payer: Self-pay | Admitting: Family Medicine

## 2017-04-02 NOTE — Telephone Encounter (Signed)
Sent to the pharmacy by [facility] as instructed. 

## 2017-04-02 NOTE — Telephone Encounter (Signed)
Dr Yong Channel pt

## 2017-04-02 NOTE — Telephone Encounter (Signed)
She is my patient, ok to refill for one year

## 2017-04-08 ENCOUNTER — Encounter: Payer: Self-pay | Admitting: Family Medicine

## 2017-04-09 ENCOUNTER — Other Ambulatory Visit: Payer: Self-pay | Admitting: Family Medicine

## 2017-04-10 ENCOUNTER — Other Ambulatory Visit: Payer: Self-pay | Admitting: Nurse Practitioner

## 2017-06-01 ENCOUNTER — Telehealth: Payer: Self-pay | Admitting: *Deleted

## 2017-06-01 MED ORDER — LETROZOLE 2.5 MG PO TABS: 2.5000 mg | ORAL_TABLET | Freq: Every day | ORAL | 3 refills | Status: DC

## 2017-06-01 NOTE — Telephone Encounter (Addendum)
Received call from pt stating that her cancer med has been changed from a tier 1 to a tier 3 per her insurance.  Tier 1 is $24 & Tier 3 is $138.  Insurance suggested arimidex which she has tried before & gave her diarrhea but is willing to retry & something that started with a Z.  Call back # is home [phone]. OK to leave message on vm per pt.  Message to Dr Burr Medico. Dr Burr Medico states pt will finish aromatase inhibiter in Aug 2019 but will change to letrozole 2.5 mg daily & script will be sent to her pharmacy.  She states she has enough exemestane to get through mid feb.

## 2017-06-14 NOTE — Progress Notes (Signed)
HPI: FU atrial fibrillation. Echo 12/16 showed EF 50, mild biatrial enlargement, mild MR. TSH 11/16-3.67. Holter repeated September 2017 and showed rate was controlled. Since last seen, the patient has dyspnea with more extreme activities but not with routine activities. It is relieved with rest. It is not associated with chest pain. There is no orthopnea, PND or pedal edema. There is no syncope or palpitations. There is no exertional chest pain.   Current Outpatient Medications  Medication Sig Dispense Refill  . ACCU-CHEK AVIVA PLUS test strip TEST TWICE DAILY **ICD-10 CODE E13.9** 200 each 1  . allopurinol (ZYLOPRIM) 300 MG tablet TAKE 1 TABLET (300 MG TOTAL) BY MOUTH DAILY. **DNF 04/06/15** 90 tablet 3  . apixaban (ELIQUIS) 5 MG TABS tablet Take 1 tablet (5 mg total) by mouth 2 (two) times daily. 180 tablet 3  . chlorpheniramine (CHLOR-TRIMETON) 4 MG tablet Take 4 mg 2 (two) times daily as needed by mouth for allergies.    Marland Kitchen diltiazem (CARDIZEM CD) 360 MG 24 hr capsule TAKE 1 CAPSULE (360 MG TOTAL) BY MOUTH DAILY. 90 capsule 3  . fish oil-omega-3 fatty acids 1000 MG capsule Take 1 g by mouth daily.    Marland Kitchen letrozole (FEMARA) 2.5 MG tablet Take 1 tablet (2.5 mg total) by mouth daily. 30 tablet 3  . metFORMIN (GLUCOPHAGE) 500 MG tablet Take 1 tablet (500 mg total) by mouth 2 (two) times daily. 180 tablet 3  . Multiple Vitamin (MULTIVITAMIN) tablet Take 1 tablet by mouth daily.    Marland Kitchen VITAMIN D, ERGOCALCIFEROL, PO Take by mouth.    Marland Kitchen atenolol (TENORMIN) 25 MG tablet TAKE 1 TABLET (25 MG TOTAL) BY MOUTH DAILY. 30 tablet 10  . exemestane (AROMASIN) 25 MG tablet Take 1 tablet (25 mg total) daily by mouth. 90 tablet 3  . OVER THE COUNTER MEDICATION Take by mouth 2 (two) times daily. Presavision- for macular degeneration prevention     No current facility-administered medications for this visit.      Past Medical History:  Diagnosis Date  . Allergy   . Atrial fibrillation ([place])   . Breast  cancer ([place]) 08/25/12   Invasive ductal ca,DCIS  . Diabetes mellitus    type II  . Gout   . Hearing loss   . History of frequent urinary tract infections   . Hx of radiation therapy 11/22/12- 12/19/12   breast 4250 cGy 17 sessions, left breast boost 750 cGy 3 sessions  . Hyperlipidemia   . Hypertension   . Osteopenia     Past Surgical History:  Procedure Laterality Date  . ABDOMINAL HYSTERECTOMY Bilateral 1999   w/b/l salpingo-oopherectomy  . APPENDECTOMY    . BREAST LUMPECTOMY WITH NEEDLE LOCALIZATION AND AXILLARY SENTINEL LYMPH NODE BX Left 09/12/2012   Procedure: LEFT NEEDLE LOCALIZATION BREAST LUMPECTOMY TION AND AXILLARY SENTINEL LYMPH NODE BX;  Surgeon: Edward Jolly, MD;  Location: [facility];  Service: General;  Laterality: Left;  . BREAST SURGERY  1990's   right- fibroid cyst  . CHOLECYSTECTOMY    . SEPTOPLASTY    . TONSILLECTOMY      Social History   Socioeconomic History  . Marital status: Married    Spouse name: Not on file  . Number of children: 3  . Years of education: Not on file  . Highest education level: Not on file  Social Needs  . Financial resource strain: Not on file  . Food insecurity - worry: Not on file  . Food insecurity -  inability: Not on file  . Transportation needs - medical: Not on file  . Transportation needs - non-medical: Not on file  Occupational History  . Not on file  Tobacco Use  . Smoking status: Former Smoker    Packs/day: 0.25    Years: 4.00    Pack years: 1.00    Types: Cigarettes  . Smokeless tobacco: Never Used  . Tobacco comment: Cigarette use was 50 years ago  Substance and Sexual Activity  . Alcohol use: Yes    Comment: Occ glass of wine with dinner  . Drug use: No  . Sexual activity: No    Comment: menarche age 89, 77st birth age 36, G65, HRT x 7 years?  Other Topics Concern  . Not on file  Social History Narrative   Married - husband in memory care unit    Three children ( One lives at [place] and two in  [place]      She likes to read and go to beach     Family History  Problem Relation Age of Onset  . Hypertension Mother   . Heart disease Mother        Murmur and irregular heart disease  . Hypertension Father   . Cancer Cousin        breast    ROS: no fevers or chills, productive cough, hemoptysis, dysphasia, odynophagia, melena, hematochezia, dysuria, hematuria, rash, seizure activity, orthopnea, PND, pedal edema, claudication. Remaining systems are negative.  Physical Exam: Well-developed well-nourished in no acute distress.  Skin is warm and dry.  HEENT is normal.  Neck is supple.  Chest is clear to auscultation with normal expansion.  Cardiovascular exam is irregular Abdominal exam nontender or distended. No masses palpated. Extremities show no edema. neuro grossly intact   A/P  1 permanent atrial fibrillation-patient remains asymptomatic and our plan is rate control and anticoagulation.  Continue Cardizem and atenolol.  Continue apixaban.  Check hemoglobin and renal function.  2 hypertension-blood pressure is controlled.  Continue present medications.  3 hyperlipidemia-managed by primary care.  Kirk Ruths, MD

## 2017-06-24 ENCOUNTER — Encounter: Payer: Self-pay | Admitting: Cardiology

## 2017-06-24 ENCOUNTER — Ambulatory Visit: Payer: Medicare Other | Admitting: Cardiology

## 2017-06-24 VITALS — BP 138/72 | HR 66 | Ht 62.5 in | Wt 139.6 lb

## 2017-06-24 DIAGNOSIS — I10 Essential (primary) hypertension: Secondary | ICD-10-CM

## 2017-06-24 DIAGNOSIS — I482 Chronic atrial fibrillation: Secondary | ICD-10-CM

## 2017-06-24 DIAGNOSIS — E7800 Pure hypercholesterolemia, unspecified: Secondary | ICD-10-CM

## 2017-06-24 DIAGNOSIS — I4821 Permanent atrial fibrillation: Secondary | ICD-10-CM

## 2017-06-24 LAB — CBC
HEMATOCRIT: 43.6 % (ref 34.0–46.6)
HEMOGLOBIN: 15 g/dL (ref 11.1–15.9)
MCH: 32.3 pg (ref 26.6–33.0)
MCHC: 34.4 g/dL (ref 31.5–35.7)
MCV: 94 fL (ref 79–97)
Platelets: 210 10*3/uL (ref 150–379)
RBC: 4.64 x10E6/uL (ref 3.77–5.28)
RDW: 14 % (ref 12.3–15.4)
WBC: 7.4 10*3/uL (ref 3.4–10.8)

## 2017-06-24 LAB — BASIC METABOLIC PANEL
BUN/Creatinine Ratio: 17 (ref 12–28)
BUN: 21 mg/dL (ref 8–27)
CALCIUM: 10.2 mg/dL (ref 8.7–10.3)
CHLORIDE: 103 mmol/L (ref 96–106)
CO2: 22 mmol/L (ref 20–29)
Creatinine, Ser: 1.27 mg/dL — ABNORMAL HIGH (ref 0.57–1.00)
GFR calc Af Amer: 46 mL/min/{1.73_m2} — ABNORMAL LOW (ref >59)
GFR, EST NON AFRICAN AMERICAN: 40 mL/min/{1.73_m2} — AB (ref >59)
Glucose: 137 mg/dL — ABNORMAL HIGH (ref 65–99)
Potassium: 4.4 mmol/L (ref 3.5–5.2)
Sodium: 143 mmol/L (ref 134–144)

## 2017-06-24 NOTE — Patient Instructions (Signed)
Medication Instructions:   NO CHANGE  Labwork:  Your physician recommends that you HAVE LAB WORK TODAY  Follow-Up:  Your physician wants you to follow-up in: ONE YEAR WITH DR CRENSHAW You will receive a reminder letter in the mail two months in advance. If you don't receive a letter, please call our office to schedule the follow-up appointment.   If you need a refill on your cardiac medications before your next appointment, please call your pharmacy.    

## 2017-06-29 ENCOUNTER — Encounter: Payer: Self-pay | Admitting: *Deleted

## 2017-08-09 ENCOUNTER — Other Ambulatory Visit: Payer: Self-pay | Admitting: Hematology

## 2017-09-14 ENCOUNTER — Encounter: Payer: Self-pay | Admitting: Adult Health

## 2017-09-14 ENCOUNTER — Telehealth: Payer: Self-pay

## 2017-09-14 LAB — HM MAMMOGRAPHY

## 2017-09-14 NOTE — Telephone Encounter (Signed)
Faxed order for bilateral US guided biopsy to [facility].

## 2017-09-17 ENCOUNTER — Encounter: Payer: Self-pay | Admitting: Family Medicine

## 2017-09-20 ENCOUNTER — Inpatient Hospital Stay: Payer: Medicare Other | Attending: Hematology | Admitting: Hematology

## 2017-09-20 ENCOUNTER — Telehealth: Payer: Self-pay | Admitting: Hematology

## 2017-09-20 ENCOUNTER — Inpatient Hospital Stay: Payer: Medicare Other

## 2017-09-20 ENCOUNTER — Encounter: Payer: Self-pay | Admitting: Hematology

## 2017-09-20 VITALS — BP 154/95 | HR 91 | Temp 97.6°F | Resp 22 | Ht 62.5 in | Wt 138.7 lb

## 2017-09-20 DIAGNOSIS — E1165 Type 2 diabetes mellitus with hyperglycemia: Secondary | ICD-10-CM

## 2017-09-20 DIAGNOSIS — Z170 Estrogen receptor positive status [ER+]: Secondary | ICD-10-CM

## 2017-09-20 DIAGNOSIS — I10 Essential (primary) hypertension: Secondary | ICD-10-CM | POA: Diagnosis not present

## 2017-09-20 DIAGNOSIS — M25552 Pain in left hip: Secondary | ICD-10-CM | POA: Diagnosis not present

## 2017-09-20 DIAGNOSIS — C50912 Malignant neoplasm of unspecified site of left female breast: Secondary | ICD-10-CM | POA: Diagnosis present

## 2017-09-20 DIAGNOSIS — Z79899 Other long term (current) drug therapy: Secondary | ICD-10-CM | POA: Diagnosis not present

## 2017-09-20 DIAGNOSIS — C50412 Malignant neoplasm of upper-outer quadrant of left female breast: Secondary | ICD-10-CM

## 2017-09-20 DIAGNOSIS — M8580 Other specified disorders of bone density and structure, unspecified site: Secondary | ICD-10-CM

## 2017-09-20 DIAGNOSIS — N63 Unspecified lump in unspecified breast: Secondary | ICD-10-CM

## 2017-09-20 DIAGNOSIS — N6310 Unspecified lump in the right breast, unspecified quadrant: Secondary | ICD-10-CM | POA: Diagnosis not present

## 2017-09-20 LAB — HM DEXA SCAN

## 2017-09-20 LAB — COMPREHENSIVE METABOLIC PANEL
ALBUMIN: 4 g/dL (ref 3.5–5.0)
ALT: 35 U/L (ref 0–55)
AST: 20 U/L (ref 5–34)
Alkaline Phosphatase: 93 U/L (ref 40–150)
Anion gap: 8 (ref 3–11)
BUN: 25 mg/dL (ref 7–26)
CHLORIDE: 107 mmol/L (ref 98–109)
CO2: 24 mmol/L (ref 22–29)
CREATININE: 1.08 mg/dL (ref 0.60–1.10)
Calcium: 9.7 mg/dL (ref 8.4–10.4)
GFR calc Af Amer: 55 mL/min — ABNORMAL LOW (ref >60)
GFR, EST NON AFRICAN AMERICAN: 47 mL/min — AB (ref >60)
GLUCOSE: 142 mg/dL — AB (ref 70–140)
POTASSIUM: 4.1 mmol/L (ref 3.5–5.1)
Sodium: 139 mmol/L (ref 136–145)
Total Bilirubin: 0.5 mg/dL (ref 0.2–1.2)
Total Protein: 7.1 g/dL (ref 6.4–8.3)

## 2017-09-20 LAB — CBC WITH DIFFERENTIAL/PLATELET
BASOS PCT: 1 %
Basophils Absolute: 0.1 10*3/uL (ref 0.0–0.1)
Eosinophils Absolute: 0.2 10*3/uL (ref 0.0–0.5)
Eosinophils Relative: 2 %
HEMATOCRIT: 44.9 % (ref 34.8–46.6)
Hemoglobin: 14.7 g/dL (ref 11.6–15.9)
LYMPHS PCT: 22 %
Lymphs Abs: 1.6 10*3/uL (ref 0.9–3.3)
MCH: 31.6 pg (ref 25.1–34.0)
MCHC: 32.7 g/dL (ref 31.5–36.0)
MCV: 96.8 fL (ref 79.5–101.0)
Monocytes Absolute: 0.7 10*3/uL (ref 0.1–0.9)
Monocytes Relative: 10 %
NEUTROS ABS: 4.7 10*3/uL (ref 1.5–6.5)
Neutrophils Relative %: 65 %
PLATELETS: 180 10*3/uL (ref 145–400)
RBC: 4.64 MIL/uL (ref 3.70–5.45)
RDW: 14.3 % (ref 11.2–14.5)
WBC: 7.3 10*3/uL (ref 3.9–10.3)

## 2017-09-20 NOTE — Telephone Encounter (Signed)
Appointments scheduled AVS/Calendar printed per 5/20 los °

## 2017-09-20 NOTE — Progress Notes (Signed)
[facility] Follow-up office visit  Date of Service:  09/20/2017   ID: [name] OB: Aug 13, 1934  MR#: [id number]  TOI#:[id number]  PCP: [name], NP     CHIEF COMPLAINT: breast cancer f/u  Oncology History   Breast cancer, left breast   Staging form: Breast, AJCC 7th Edition     Clinical stage from 09/12/2012: Stage IA (T1b, N0, M0) - Unsigned Breast cancer   Staging form: Breast, AJCC 7th Edition     Clinical stage from 09/29/2012: Stage IA (T1, N0, cM0) - Unsigned     Pathologic: No stage assigned - Unsigned       Breast cancer, left breast (HCC)   08/25/2012 Receptors her2    ER 100% positive, PR 88% positive, HER-2 negative, Ki-67 16%      08/30/2012 Initial Diagnosis    Breast cancer, left breast      09/12/2012 Pathology Results    T1bN0 Grade 1 invasive ductal carcinoma, and DCIS.      09/12/2012 Surgery    Left breast lumpectomy and sentinel lymph node biopsy, negative margins.      11/09/2012 - 12/13/2012 Radiation Therapy    Adjuvant breast radiation      12/2012 -  Anti-estrogen oral therapy    Anastrozole 1 mg daily, switched to Aromasin 25 mg daily in Jan 2015 due to diarrhea.      09/01/2016 Imaging    [name] Outside films Breast form [facility] 09/01/2016 IMPRESSION: Probably Benign 1. No significant change in oval hypoechoic masses in the left breast at 11:00 2 cm from the nipple and 10:30 4 cm from the nipple. Both of these masses resemble the area of fat necrosis previously biopsies at 2:00. In addition, both masses have developing central benign or dystrophic appearing calcifications and are favored to be other areas of fat necrosis. A 6 month follow-up left breast mammogram and ultrasound is recommended.      09/01/2016 Mammogram    HM Mammogram from [facility] 09/01/16 IMPRESSION  Incomplete - additional imaging evaluation needed Ultrasound of the upper medial left breast mass is recommended.       03/09/2017 Mammogram    Previous lumpectomy  changes in the upper outer left breast anterior depth with stable associated dystrophic calcifications.  Biopsy clip at the 2:00 in the left breast near the lumpectomy site corresponding to previously biopsied benign fat necrosis.  Persistent round mass in the upper medial left breast with benign-appearing central round calcification.  No significant masses, calcifications, or other findings are seen in the breast  Impression: ultrasound is recommended for the left breast      03/09/2017 Breast US    Left breast ultrasound impression:  No significant change in oval hypoechoic masses in the left breast at 11:00 2 cm from the nipple and 10:30 4 cm from the nipple.  Both of these masses resemble the area of fat necrosis previously biopsied at 2:00.  In addition, both masses have developing central benign or dystrophic appearing calcifications and are favored to be other areas of fat necrosis.  A 6-monthfollow-up bilateral mammogram and left breast ultrasound is recommended.      09/14/2017 Breast [place]   [place]ast [place] 09[place]14[place]17 at [facility]  IMPRESSION:  The 1 cm oval mass in the left breast at 9:30 posterior depth is highly suggestive of malignancy. An Ultrasound guided biopsy is recommended.  The 0.8 cm round mass in the left breast at 11-12 o'clock anterior depth is suspicious  of malignancy. An ultrasound guided biopsy is recommended.         CURRENT THERAPY: Anastrozole switched to Aromasin in 2015 due to diarrhea. Aromasin was switched to Letrozole in 08/2017 due to high copay. Plan to stop letrozole in 12/2017.    INTERVAL HISTORY:  Sandara returns for follow-up of her left breast cancer. She was last seen by me 1 year ago. In interim she was seen by NP lacie 6 months ago.  She presents to the clinic today noting her mammogram from last week that behind her left breast scar is a mass present and a mass on her right breast. She notes she did not feel the lump herself. She notes she is very  concerned but will have biopsy on 09/22/17. She notes her BP is elevated. She notes she had to switched exemestane to letrozole due to high copay. She is tolerating well.   She notes she is in [facility] and does enough for herself. She still drives.   REVIEW OF SYSTEMS: A 10 point review of systems was conducted and is otherwise negative except for what is noted above.     PAST MEDICAL HISTORY: Past Medical History:  Diagnosis Date  . Allergy   . Atrial fibrillation ([place])   . Breast cancer ([place]) 08/25/12   Invasive ductal ca,DCIS  . Diabetes mellitus    type II  . Gout   . Hearing loss   . History of frequent urinary tract infections   . Hx of radiation therapy 11/22/12- 12/19/12   breast 4250 cGy 17 sessions, left breast boost 750 cGy 3 sessions  . Hyperlipidemia   . Hypertension   . Osteopenia     PAST SURGICAL HISTORY: Past Surgical History:  Procedure Laterality Date  . ABDOMINAL HYSTERECTOMY Bilateral 1999   w/b/l salpingo-oopherectomy  . APPENDECTOMY    . BREAST LUMPECTOMY WITH NEEDLE LOCALIZATION AND AXILLARY SENTINEL LYMPH NODE BX Left 09/12/2012   Procedure: LEFT NEEDLE LOCALIZATION BREAST LUMPECTOMY TION AND AXILLARY SENTINEL LYMPH NODE BX;  Surgeon: Edward Jolly, MD;  Location: [place];  Service: General;  Laterality: Left;  . BREAST SURGERY  1990's   right- fibroid cyst  . CHOLECYSTECTOMY    . SEPTOPLASTY    . TONSILLECTOMY      FAMILY HISTORY Family History  Problem Relation Age of Onset  . Hypertension Mother   . Heart disease Mother        Murmur and irregular heart disease  . Hypertension Father   . Cancer Cousin        breast    GYNECOLOGIC HISTORY: menarche at age 33, g31 p3, s/p TAH/BSO in early 25s.  Was on HRT for several years (cannot recall the duration).  No h/o abnormal pap smears or sexually transmitted infections.    SOCIAL HISTORY: Lives in [place], [place] in a three story house, will move to [facility]  soon.  Her husband lives at a nursing home due to dementia.  She is a retired Pharmacist, hospital.  Her son and daughter live in [place].      ADVANCED DIRECTIVES: Not in place.    HEALTH MAINTENANCE: Social History   Tobacco Use  . Smoking status: Former Smoker    Packs/day: 0.25    Years: 4.00    Pack years: 1.00    Types: Cigarettes  . Smokeless tobacco: Never Used  . Tobacco comment: Cigarette use was 50 years ago  Substance Use Topics  . Alcohol use: Yes  Comment: Occ glass of wine with dinner  . Drug use: No    Mammogram: 09/01/16  Colonoscopy: 2011 Bone Density Scan: 03/05/2015 osteopenia  Pap Smear: s/p tah/bso Eye Exam: due Vitamin D Level:  Normal, 04/14/13 Lipid Panel: unsure   Allergies  Allergen Reactions  . Penicillins Hives and Swelling  . Prednisone Other (See Comments)    ELEVATED BLOOD SUGAR  . Radiaplexrx [Woun'Dres Hydrogel Wound Dress] Hives    Current Outpatient Medications  Medication Sig Dispense Refill  . ACCU-CHEK AVIVA PLUS test strip TEST TWICE DAILY **ICD-10 CODE E13.9** 200 each 1  . allopurinol (ZYLOPRIM) 300 MG tablet TAKE 1 TABLET (300 MG TOTAL) BY MOUTH DAILY. **DNF 04/06/15** 90 tablet 3  . apixaban (ELIQUIS) 5 MG TABS tablet Take 1 tablet (5 mg total) by mouth 2 (two) times daily. 180 tablet 3  . chlorpheniramine (CHLOR-TRIMETON) 4 MG tablet Take 4 mg 2 (two) times daily as needed by mouth for allergies.    Marland Kitchen diltiazem (CARDIZEM CD) 360 MG 24 hr capsule TAKE 1 CAPSULE (360 MG TOTAL) BY MOUTH DAILY. 90 capsule 3  . fish oil-omega-3 fatty acids 1000 MG capsule Take 1 g by mouth daily.    Marland Kitchen letrozole (FEMARA) 2.5 MG tablet TAKE 1 TABLET BY MOUTH EVERY DAY 30 tablet 2  . metFORMIN (GLUCOPHAGE) 500 MG tablet Take 1 tablet (500 mg total) by mouth 2 (two) times daily. 180 tablet 3  . Multiple Vitamin (MULTIVITAMIN) tablet Take 1 tablet by mouth daily.    Marland Kitchen OVER THE COUNTER MEDICATION Take by mouth 2 (two) times daily. Presavision- for macular  degeneration prevention    . VITAMIN D, ERGOCALCIFEROL, PO Take by mouth.    Marland Kitchen atenolol (TENORMIN) 25 MG tablet TAKE 1 TABLET (25 MG TOTAL) BY MOUTH DAILY. 30 tablet 10  . exemestane (AROMASIN) 25 MG tablet Take 1 tablet (25 mg total) daily by mouth. (Patient not taking: Reported on 09/20/2017) 90 tablet 3   No current facility-administered medications for this visit.     OBJECTIVE: Vitals:   09/20/17 0952  BP: (!) 154/95  Pulse: 91  Resp: (!) 22  Temp: 97.6 F (36.4 C)  SpO2: 99%     Body mass index is 24.96 kg/m.      GENERAL: Patient is a well appearing female in no acute distress HEENT:  Sclerae anicteric.  Oropharynx clear and moist. No ulcerations or evidence of oropharyngeal candidiasis. Neck is supple.  NODES:  No cervical, supraclavicular, or axillary lymphadenopathy palpated.  BREAST EXAM: left breast s/p lumpectomy, a small mass underneath the recent biopsy site, with resolving skin ecchymosis. No other nodularity or masses, right breast no masses or nodules, benign bilateral breast exam. Bilateral axillary exam was negative for adenopathy. LUNGS:  Clear to auscultation bilaterally.  No wheezes or rhonchi. HEART:  Regular rate and rhythm. No murmur appreciated. ABDOMEN:  Soft, nontender.  Positive, normoactive bowel sounds. No organomegaly palpated. MSK:  No focal spinal tenderness to palpation. Full range of motion bilaterally in the upper extremities. EXTREMITIES:  No peripheral edema.   SKIN:  Clear with no obvious rashes or skin changes. No nail dyscrasia. NEURO:  Nonfocal. Well oriented.  Appropriate affect. Breasts: Breast inspection showed them to be symmetrical with no nipple discharge. Palpation of the breasts and axilla revealed no obvious mass that I could appreciate.  ECOG FS:1 - Symptomatic but completely ambulatory  LAB RESULTS: CBC Latest Ref Rng & Units 09/20/2017 06/24/2017 03/22/2017  WBC 3.9 - 10.3 K/uL 7.3  7.4 7.3  Hemoglobin 11.6 - 15.9 g/dL 14.7 15.0  14.6  Hematocrit 34.8 - 46.6 % 44.9 43.6 44.0  Platelets 145 - 400 K/uL 180 210 169    CMP Latest Ref Rng & Units 09/20/2017 06/24/2017 03/22/2017  Glucose 70 - 140 mg/dL 142(H) 137(H) 140  BUN 7 - 26 mg/dL 25 21 23.8  Creatinine 0.60 - 1.10 mg/dL 1.08 1.27(H) 1.2(H)  Sodium 136 - 145 mmol/L 139 143 139  Potassium 3.5 - 5.1 mmol/L 4.1 4.4 4.4  Chloride 98 - 109 mmol/L 107 103 -  CO2 22 - 29 mmol/L _0 Calcium 8.4 - 10.4 mg/dL 9.7 10.2 10.1  Total Protein 6.4 - 8.3 g/dL 7.1 - 7.0  Total Bilirubin 0.2 - 1.2 mg/dL 0.5 - 0.44  Alkaline Phos 40 - 150 U/L 93 - 84  AST 5 - 34 U/L 20 - 19  ALT 0 - 55 U/L 35 - 24   PATHOLOGY REPORT Diagnosis  08/28/2015 Breast, left, needle core biopsy - FAT NECROSIS. - THERE IS NO EVIDENCE OF MALIGNANCY. - SEE COMMENT.  STUDIES: I have requested her recent mammogram report from [facility].  Breast US Bilateral 09/14/17 at [facility]  IMPRESSION:  The 1 cm oval mass in the left breast at 9:30 posterior depth is highly suggestive of malignancy. An Ultrasound guided biopsy is recommended.  The 0.8 cm round mass in the left breast at 11-12 o'clock anterior depth is suspicious of malignancy. An ultrasound guided biopsy is recommended.    [name] Outside Films Breast 03/09/17  IMPRESSION: 1. No significant change in oval masses in the left breast at the 11:00 cm from the nipple and 10:30 4 cm from the nipple.  Both of these masses resemble the area of fat necrosis previously biopsied at 2:00. In addition, both of these masses have developing central benign or dystrophic appearing calcifications and are favored to be other areas of fat necrosis.  A 6 month follow up bilateral mammogram and left breast US is recommended.  [name] Outside films Breast form [facility] 09/01/2016 IMPRESSION: Probably Benign 1. No significant change in oval hypoechoic masses in the left breast at 11:00 2 cm from the nipple and 10:30 4 cm from the nipple. Both of these masses resemble the area of fat  necrosis previously biopsies at 2:00. In addition, both masses have developing central benign or dystrophic appearing calcifications and are favored to be other areas of fat necrosis. A 6 month follow-up left breast mammogram and ultrasound is recommended.  HM Mammogram from [facility] 09/01/16 IMPRESSION  Incomplete - additional imaging evaluation needed Ultrasound of the upper medial left breast mass is recommended.    Left breast mammogram and ultrasound 02/27/2016 Impression: probably benign The 4 mm oval mass in the left breast 12:00 position likely represent fat necrosis, the 3 mm mass in the left breast upper inner quadrant middle depth most likely is fat necrosis, the 4 mm oval cyst in the left breast upper inner quadrant posterior depth is benign. Post surgical scar in the left breast upper outer quadrant anterior depth is benign.  ASSESSMENT: 83 y.o. [place], [place] woman   1. pT1bN0, stage IA invasive ductal carcinoma of the left breast, grade I, ER 100%, PR 88%, Ki-67 16%, HER-2/neu negative. -she underwent lumpectomy with sentinel lymph node biopsy on 09/12/2012, s/p radiation therapy from 11/09/2012 through 12/19/2012.  -She is clinically doing very well, her lab and physical exam today was unremarkable. Her left breast biopsy in 08/2015 showed fat necrosis.  No clinical evidence of recurrence. -Her repeated mammogram in May 2018 showed 2 lesions in UIQ of left breast, stable, likely benign (fat necrosis per biopsy in 2017) -Left breast mammogram and ultrasound on 03/09/2017 reveal hypoechoic masses that have developed central benign or dystrophic appearing calcifications and are favored to be areas of fat necrosis. -she is on adjuvant Aromasin, tolerating well, we'll continue, for total 5 years, until 11/2017  -09/16/17 US showed mass behind her left breast scar and a mass In her right breast, both highly sugestive of malignancy. She is to have bilateral biopsy next week. I will review results  of biopsy and see her back if biopsy shows malignancy. -Her Exemestane was switched to letrozole in 08/2017 due to high copay. She is tolerating well.  -If her biopsy is negative we plan to stop letrozole in July 2019, if not negative I will see her sooner.  -cancer She is clinically doing well. Lab reviewed, her CBC and CMP are within normal limits. Her physical exam was unremarkable.   -F/u in 6 months or sooner if biopsy is positive for malignancy   2. Osteopenia -She is on vitamin D supplement daily. -Giving her worsening hypercalcemia, I told her to stop calcium  -Her recent bone density scan from 03/05/2015 showed osteopenia, with 10 year probability of major ostial parotic fracture 25%, and hip fracture 15%. Giving the high risk of fracture, she would benefit from bisphosphonate or Prolia injection. I previously discussed the benefit and potential side effects, and written material of Prolia was given to her on last visit  -She may have take biphosphonate in the past, she cannot remember for sure -She has not done Prolia injection yet and she is hesitant to due to the concern of side effects  3. Hypercalcemia -Her calcium was 10.7 in 2016, 10.3 in 2018, since I reduced her calcium supplement -She knows to drink adequately and avoid dehydration  -She'll follow up with primary care physician for further workup.  -She was previously instructed to restart Vit D in 03/2017 as well sa continue light weight bearing exercise.  -Calcium normal today (09/20/17)  4. Diabetes and hyperglycemia  -She noticed hyperglycemia since she started steroids for her left hip pain -I previously encouraged her to f/u with PCP to see if she needs to change her metformin medication or dosage as her sugar has been higher lately.  -I also suggested she stop taking her steroid medication as she no longer needs it.   Plan -She is scheduled for bilateral breast biopsy this week, I plan to call her or see her back  after the biopsy  -continue Letrozole, will stop in the end of July 2019 if her biopsy negative -Labs and follow up in 6 months, or sooner if biopsy positive.   I spent 20 minutes counseling the patient face to face.  The total time spent in the appointment was 25 minutes.  This document serves as a record of services personally performed by Truitt Merle, MD. It was created on her behalf by Joslyn Devon, a trained medical scribe. The creation of this record is based on the scribe's personal observations and the provider's statements to them.   I have reviewed the above documentation for accuracy and completeness, and I agree with the above.  Truitt Merle   09/20/2017

## 2017-09-22 ENCOUNTER — Other Ambulatory Visit: Payer: Self-pay | Admitting: Radiology

## 2017-09-22 ENCOUNTER — Telehealth: Payer: Self-pay | Admitting: *Deleted

## 2017-09-22 NOTE — Telephone Encounter (Signed)
Received faxed results of Bone Density done at [facility]  09/20/17.   Gave results to Dr. Burr Medico for review.

## 2017-09-29 ENCOUNTER — Telehealth: Payer: Self-pay | Admitting: Hematology

## 2017-09-29 NOTE — Telephone Encounter (Signed)
I called pt to discuss her recent breast biopsy, and bone density scan results.  I tried both her cell phone and home phone, and did not reach her.  I left a message, and I will schedule her an appointment to see me next week to review the results.  Truitt Merle  09/29/2017

## 2017-09-30 ENCOUNTER — Ambulatory Visit: Payer: Self-pay | Admitting: General Surgery

## 2017-09-30 ENCOUNTER — Encounter: Payer: Self-pay | Admitting: Family Medicine

## 2017-09-30 DIAGNOSIS — C50911 Malignant neoplasm of unspecified site of right female breast: Secondary | ICD-10-CM

## 2017-10-06 ENCOUNTER — Telehealth: Payer: Self-pay | Admitting: Hematology

## 2017-10-06 NOTE — Telephone Encounter (Signed)
Appointment scheduled / patient notified per 6/4 sch msg

## 2017-10-08 ENCOUNTER — Inpatient Hospital Stay: Payer: Medicare Other | Attending: Hematology | Admitting: Hematology

## 2017-10-08 ENCOUNTER — Telehealth: Payer: Self-pay

## 2017-10-08 ENCOUNTER — Encounter: Payer: Self-pay | Admitting: Hematology

## 2017-10-08 VITALS — BP 154/86 | HR 102 | Resp 17 | Ht 62.5 in | Wt 138.4 lb

## 2017-10-08 DIAGNOSIS — E1165 Type 2 diabetes mellitus with hyperglycemia: Secondary | ICD-10-CM | POA: Insufficient documentation

## 2017-10-08 DIAGNOSIS — Z87891 Personal history of nicotine dependence: Secondary | ICD-10-CM | POA: Insufficient documentation

## 2017-10-08 DIAGNOSIS — Z170 Estrogen receptor positive status [ER+]: Secondary | ICD-10-CM | POA: Insufficient documentation

## 2017-10-08 DIAGNOSIS — M8580 Other specified disorders of bone density and structure, unspecified site: Secondary | ICD-10-CM | POA: Diagnosis not present

## 2017-10-08 DIAGNOSIS — C50912 Malignant neoplasm of unspecified site of left female breast: Secondary | ICD-10-CM

## 2017-10-08 DIAGNOSIS — Z171 Estrogen receptor negative status [ER-]: Secondary | ICD-10-CM

## 2017-10-08 DIAGNOSIS — C50411 Malignant neoplasm of upper-outer quadrant of right female breast: Secondary | ICD-10-CM | POA: Insufficient documentation

## 2017-10-08 DIAGNOSIS — C50412 Malignant neoplasm of upper-outer quadrant of left female breast: Secondary | ICD-10-CM

## 2017-10-08 NOTE — Telephone Encounter (Signed)
Scheduled with Genetic per 6/7 los. F/u open

## 2017-10-08 NOTE — Telephone Encounter (Signed)
Printed avs and calender of upcoming appointment. 

## 2017-10-08 NOTE — Progress Notes (Signed)
Location of Breast Cancer: upper outer quadrant right breast in female  Histology per Pathology Report:   Diagnosis 09-22-17 1. Breast, right, needle core biopsy - INVASIVE DUCTAL CARCINOMA, MSBR GRADE II/III. - DUCTAL CARCINOMA IN SITU WITH NECROSIS. 2. Breast, left, needle core biopsy - FAT NECROSIS. - NO EVIDENCE OF MALIGNANCY.   Receptor Status: ER(0% -), PR ( 0% -), Her2-neu (-), Ki-(80%)  Did patient present with symptoms (if so, please note symptoms) or was this found on screening mammography?: Screening mamogram  Past/Anticipated interventions by surgeon, if any: Dr. Excell Seltzer  Discussed options of conservation surgery with lumpectomy or total mastectomy and sentinal lymph node biopsy/dissection. Will schedule genetic testing and schedule her surgery far enough out to get the result back. Discussed chemotherapy, hormonal therapy and radiation.  Past/Anticipated interventions by medical oncology, if any:10-08-17 Dr. Burr Medico  Urgent referral to genetics  Rad/onc referral to be seen ASAP F/u after her surgery, to determine adjuvant chemo         Letrozole 2.5 mg daily   All reviewed by Dr. Excell Seltzer shared with Dr. Burr Medico Discussed options of conservation surgery with lumpectomy or total mastectomy and sentinal lymph node biopsy/dissection. Will schedule genetic testing and schedule her surgery far enough out to get the result back. Discussed chemotherapy, hormonal therapy and radiation.     Lymphedema issues, if any:  N/A ROM to right armgood  Pain issues, if any:  NO  SAFETY ISSUES:  Prior radiation? 2014 Left breast  Pacemaker/ICD? No  Possible current pregnancy? No  Is the patient on methotrexate? : No  Menarche 15 G3 P3 BC Yes LMP50's Menopause 50's HRT Yes    Current Complaints / other details:  Wt Readings from Last 3 Encounters:  10/11/17 138 lb 6.4 oz (62.8 kg)  10/08/17 138 lb 6.4 oz (62.8 kg)  09/20/17 138 lb 11.2 oz  (62.9 kg)  BP (!) 148/97 (BP Location: Left Arm, Patient Position: Sitting, Cuff Size: Normal)   Pulse 74   Temp 97.9 F (36.6 C) (Oral)   Resp 18   Ht 5' 2.5" (1.588 m)   Wt 138 lb 6.4 oz (62.8 kg)   SpO2 98%   BMI 24.91 kg/m    Georgena Spurling, RN 10/08/2017,4:11 PM

## 2017-10-08 NOTE — Progress Notes (Signed)
[facility] Follow-up office visit  Date of Service:  10/08/2017   ID: Adrienne [name] OB: 01/12/1935  MR#: [id number]  HAL#:[id number]  PCP: [name], NP      CHIEF COMPLAINT: newly diagnosed right breast cancer, triple negative   Oncology History   Breast cancer, left breast   Staging form: Breast, AJCC 7th Edition     Clinical stage from 09/12/2012: Stage IA (T1b, N0, M0) - Unsigned Breast cancer   Staging form: Breast, AJCC 7th Edition     Clinical stage from 09/29/2012: Stage IA (T1, N0, cM0) - Unsigned     Pathologic: No stage assigned - Unsigned       Breast cancer, left breast (HCC)   08/25/2012 Receptors her2    ER 100% positive, PR 88% positive, HER-2 negative, Ki-67 16%      08/30/2012 Initial Diagnosis    Breast cancer, left breast      09/12/2012 Pathology Results    T1bN0 Grade 1 invasive ductal carcinoma, and DCIS.      09/12/2012 Surgery    Left breast lumpectomy and sentinel lymph node biopsy, negative margins.      11/09/2012 - 12/13/2012 Radiation Therapy    Adjuvant breast radiation      12/2012 -  Anti-estrogen oral therapy    Anastrozole 1 mg daily, switched to Aromasin 25 mg daily in Jan 2015 due to diarrhea.      09/01/2016 Imaging    [name] Outside films Breast form [facility] 09/01/2016 IMPRESSION: Probably Benign 1. No significant change in oval hypoechoic masses in the left breast at 11:00 2 cm from the nipple and 10:30 4 cm from the nipple. Both of these masses resemble the area of fat necrosis previously biopsies at 2:00. In addition, both masses have developing central benign or dystrophic appearing calcifications and are favored to be other areas of fat necrosis. A 6 month follow-up left breast mammogram and ultrasound is recommended.      09/01/2016 Mammogram    HM Mammogram from [facility] 09/01/16 IMPRESSION  Incomplete - additional imaging evaluation needed Ultrasound of the upper medial left breast mass is recommended.       03/09/2017  Mammogram    Previous lumpectomy changes in the upper outer left breast anterior depth with stable associated dystrophic calcifications.  Biopsy clip at the 2:00 in the left breast near the lumpectomy site corresponding to previously biopsied benign fat necrosis.  Persistent round mass in the upper medial left breast with benign-appearing central round calcification.  No significant masses, calcifications, or other findings are seen in the breast  Impression: ultrasound is recommended for the left breast      03/09/2017 Breast US    Left breast ultrasound impression:  No significant change in oval hypoechoic masses in the left breast at 11:00 2 cm from the nipple and 10:30 4 cm from the nipple.  Both of these masses resemble the area of fat necrosis previously biopsied at 2:00.  In addition, both masses have developing central benign or dystrophic appearing calcifications and are favored to be other areas of fat necrosis.  A 31-monthfollow-up bilateral mammogram and left breast ultrasound is recommended.      09/14/2017 Breast [place]   Breast [place] 09/14/17 at [facility]  IMPRESSION:  The 1 cm oval mass in the left breast at 9:30 posterior depth is highly suggestive of malignancy. An Ultrasound guided biopsy is recommended.  The 0.8 cm round mass in the left breast at  11-12 o'clock anterior depth is suspicious of malignancy. An ultrasound guided biopsy is recommended.       09/22/2017 Pathology Results    Diagnosis 1. Breast, right, needle core biopsy - INVASIVE DUCTAL CARCINOMA, MSBR GRADE II/III. - DUCTAL CARCINOMA IN SITU WITH NECROSIS. 2. Breast, left, needle core biopsy - FAT NECROSIS. - NO EVIDENCE OF MALIGNANCY.       Breast cancer of upper-outer quadrant of right female breast ([place])   09/14/2017 Mammogram    Diagnostic mammogram at [facility]  IMPRESSION:  The new 0.9 cm oval high density mass in the right breast posterior depth superior region seen on the mediolateral oblique view  only is indeterminate. The 1.1 cm focal asymmetry in the left breast central to the nipple anterior depth is indeterminate.       09/14/2017 Imaging    [name] Bilateral at [facility] IMPRESSION: The 1 cm oval mass in the right breast at 9:30 posterior depth is highly suggestive of malignancy. The 0.8 cm round mass in the left breast at 11-12 o'clock anterior depth is suspicious of malignancy.       09/22/2017 Cancer Staging    Staging form: Breast, AJCC 8th Edition - Clinical stage from 09/22/2017: Stage IB (cT1b, cN0, cM0, G2, ER-, PR-, HER2-) - Signed by Truitt Merle, MD on 10/08/2017      09/22/2017 Pathology Results    Bilateral needle core biopsy 1. Breast, right, needle core biopsy - INVASIVE DUCTAL CARCINOMA, MSBR GRADE II/III. - DUCTAL CARCINOMA IN SITU WITH NECROSIS. 2. Breast, left, needle core biopsy - FAT NECROSIS. - NO EVIDENCE OF MALIGNANCY.      10/08/2017 Initial Diagnosis    Breast cancer of upper-outer quadrant of right female breast ([place])        CURRENT THERAPY: Anastrozole switched to Aromasin in 2015 due to diarrhea. Aromasin was switched to Letrozole in 08/2017 due to high copay. Plan to stop letrozole in 12/2017.    INTERVAL HISTORY:  Adrienne [name] returns for follow-up and discuss her newly diagnosed right breast cancer. She was last seen by me on 09/20/2017. She presents to the office by herself today. She has followed up with her surgeon, Dr. Excell Seltzer recently and he advised that he would like her to have genetic testing due to her prior hx of left breast cancer. She doesn't have a genetic appointment as of yet. She was advised that she could have a lumpectomy unless genetics show abnormalities.   Since her last visit to the office, she underwent bilateral needle core biopsies on 09/22/2017 with results of: Breast, right, needle core biopsy with invasive ductal carcinoma, MSBR Grade II/III. DCIS with necrosis. Breast, left, needle core biopsy with fat necrosis and no evidence of  malignancy.  On review of systems, she denies breast pain and any other symptoms. Pertinent positives are listed and detailed within the above HPI.   REVIEW OF SYSTEMS: A 10 point review of systems was conducted and is otherwise negative except for what is noted above.     PAST MEDICAL HISTORY: Past Medical History:  Diagnosis Date  . Allergy   . Atrial fibrillation ([place])   . Breast cancer ([place]) 08/25/12   Invasive ductal ca,DCIS  . Diabetes mellitus    type II  . Gout   . Hearing loss   . History of frequent urinary tract infections   . Hx of radiation therapy 11/22/12- 12/19/12   breast 4250 cGy 17 sessions, left breast boost 750 cGy 3 sessions  . Hyperlipidemia   .  Hypertension   . Osteopenia     PAST SURGICAL HISTORY: Past Surgical History:  Procedure Laterality Date  . ABDOMINAL HYSTERECTOMY Bilateral 1999   w/b/l salpingo-oopherectomy  . APPENDECTOMY    . BREAST LUMPECTOMY WITH NEEDLE LOCALIZATION AND AXILLARY SENTINEL LYMPH NODE BX Left 09/12/2012   Procedure: LEFT NEEDLE LOCALIZATION BREAST LUMPECTOMY TION AND AXILLARY SENTINEL LYMPH NODE BX;  Surgeon: Edward Jolly, MD;  Location: [facility];  Service: General;  Laterality: Left;  . BREAST SURGERY  1990's   right- fibroid cyst  . CHOLECYSTECTOMY    . SEPTOPLASTY    . TONSILLECTOMY      FAMILY HISTORY Family History  Problem Relation Age of Onset  . Hypertension Mother   . Heart disease Mother        Murmur and irregular heart disease  . Hypertension Father   . Cancer Cousin        breast    GYNECOLOGIC HISTORY: menarche at age 71, g39 p3, s/p TAH/BSO in early 36s.  Was on HRT for several years (cannot recall the duration).  No h/o abnormal pap smears or sexually transmitted infections.    SOCIAL HISTORY: Lives in [place], [place] in a three story house, will move to [facility] soon.  Her husband lives at a nursing home due to dementia.  She is a retired Pharmacist, hospital.  Her son and daughter live in [place].       ADVANCED DIRECTIVES: Not in place.    HEALTH MAINTENANCE: Social History   Tobacco Use  . Smoking status: Former Smoker    Packs/day: 0.25    Years: 4.00    Pack years: 1.00    Types: Cigarettes  . Smokeless tobacco: Never Used  . Tobacco comment: Cigarette use was 50 years ago  Substance Use Topics  . Alcohol use: Yes    Comment: Occ glass of wine with dinner  . Drug use: No    Mammogram: 09/14/2017  Colonoscopy: 2011 Bone Density Scan: 03/05/2015 osteopenia  Pap Smear: s/p tah/bso Eye Exam: due Vitamin D Level:  Normal, 04/14/13 Lipid Panel: unsure   Allergies  Allergen Reactions  . Penicillins Hives and Swelling  . Prednisone Other (See Comments)    ELEVATED BLOOD SUGAR  . Radiaplexrx [Woun'Dres Hydrogel Wound Dress] Hives    Current Outpatient Medications  Medication Sig Dispense Refill  . allopurinol (ZYLOPRIM) 300 MG tablet TAKE 1 TABLET (300 MG TOTAL) BY MOUTH DAILY. **DNF 04/06/15** 90 tablet 3  . apixaban (ELIQUIS) 5 MG TABS tablet Take 1 tablet (5 mg total) by mouth 2 (two) times daily. 180 tablet 3  . chlorpheniramine (CHLOR-TRIMETON) 4 MG tablet Take 4 mg 2 (two) times daily as needed by mouth for allergies.    Marland Kitchen diltiazem (CARDIZEM CD) 360 MG 24 hr capsule TAKE 1 CAPSULE (360 MG TOTAL) BY MOUTH DAILY. 90 capsule 3  . exemestane (AROMASIN) 25 MG tablet Take 1 tablet (25 mg total) daily by mouth. 90 tablet 3  . fish oil-omega-3 fatty acids 1000 MG capsule Take 1 g by mouth daily.    Marland Kitchen letrozole (FEMARA) 2.5 MG tablet TAKE 1 TABLET BY MOUTH EVERY DAY 30 tablet 2  . metFORMIN (GLUCOPHAGE) 500 MG tablet Take 1 tablet (500 mg total) by mouth 2 (two) times daily. 180 tablet 3  . Multiple Vitamin (MULTIVITAMIN) tablet Take 1 tablet by mouth daily.    Marland Kitchen OVER THE COUNTER MEDICATION Take by mouth 2 (two) times daily. Presavision- for macular degeneration prevention    .  VITAMIN D, ERGOCALCIFEROL, PO Take by mouth.    Marland Kitchen ACCU-CHEK AVIVA PLUS test strip TEST  TWICE DAILY **ICD-10 CODE E13.9** (Patient not taking: Reported on 10/08/2017) 200 each 1  . atenolol (TENORMIN) 25 MG tablet TAKE 1 TABLET (25 MG TOTAL) BY MOUTH DAILY. 30 tablet 10   No current facility-administered medications for this visit.     OBJECTIVE:  Vitals:   10/08/17 0950  BP: (!) 154/86  Pulse: (!) 102  Resp: 17  SpO2: 94%     Body mass index is 24.91 kg/m.      GENERAL: Patient is a well appearing female in no acute distress HEENT:  Sclerae anicteric.  Oropharynx clear and moist. No ulcerations or evidence of oropharyngeal candidiasis. Neck is supple.  NODES:  No cervical, supraclavicular, or axillary lymphadenopathy palpated.  BREAST EXAM: left breast s/p lumpectomy, a small mass underneath the recent biopsy site, with resolving skin ecchymosis. No other nodularity or masses, right breast no masses or nodules, benign bilateral breast exam. Bilateral axillary exam was negative for adenopathy. LUNGS:  Clear to auscultation bilaterally.  No wheezes or rhonchi. HEART:  Regular rate and rhythm. No murmur appreciated. ABDOMEN:  Soft, nontender.  Positive, normoactive bowel sounds. No organomegaly palpated. MSK:  No focal spinal tenderness to palpation. Full range of motion bilaterally in the upper extremities. EXTREMITIES:  No peripheral edema.   SKIN:  Clear with no obvious rashes or skin changes. No nail dyscrasia. NEURO:  Nonfocal. Well oriented.  Appropriate affect. Breasts: Breast inspection showed them to be symmetrical with no nipple discharge. Palpation of the breasts and axilla revealed no obvious mass that I could appreciate.  ECOG FS:1 - Symptomatic but completely ambulatory  LAB RESULTS: CBC Latest Ref Rng & Units 09/20/2017 06/24/2017 03/22/2017  WBC 3.9 - 10.3 K/uL 7.3 7.4 7.3  Hemoglobin 11.6 - 15.9 g/dL 14.7 15.0 14.6  Hematocrit 34.8 - 46.6 % 44.9 43.6 44.0  Platelets 145 - 400 K/uL 180 210 169    CMP Latest Ref Rng & Units 09/20/2017 06/24/2017 03/22/2017   Glucose 70 - 140 mg/dL 142(H) 137(H) 140  BUN 7 - 26 mg/dL 25 21 23.8  Creatinine 0.60 - 1.10 mg/dL 1.08 1.27(H) 1.2(H)  Sodium 136 - 145 mmol/L 139 143 139  Potassium 3.5 - 5.1 mmol/L 4.1 4.4 4.4  Chloride 98 - 109 mmol/L 107 103 -  CO2 22 - 29 mmol/L '24 22 25  '$ Calcium 8.4 - 10.4 mg/dL 9.7 10.2 10.1  Total Protein 6.4 - 8.3 g/dL 7.1 - 7.0  Total Bilirubin 0.2 - 1.2 mg/dL 0.5 - 0.44  Alkaline Phos 40 - 150 U/L 93 - 84  AST 5 - 34 U/L 20 - 19  ALT 0 - 55 U/L 35 - 24   PATHOLOGY REPORT Diagnosis  08/28/2015 Breast, left, needle core biopsy - FAT NECROSIS. - THERE IS NO EVIDENCE OF MALIGNANCY. - SEE COMMENT.  Diagnosis, 09/22/2017 1. Breast, right, needle core biopsy - INVASIVE DUCTAL CARCINOMA, MSBR GRADE II/III. - DUCTAL CARCINOMA IN SITU WITH NECROSIS. 2. Breast, left, needle core biopsy - FAT NECROSIS. - NO EVIDENCE OF MALIGNANCY.  STUDIES: I have requested her recent mammogram report from [facility].  DEXA scan, 09/20/2017  Osteopenia, very high risk of fracture, 29% for major osteoporotic fracture and 18% for hip fracture.   Breast Diagnostic 09/14/17 at [facility]  IMPRESSION:  The new 0.9 cm oval high density mass in the right breast posterior depth superior region seen on the mediolateral oblique view only  is indeterminate. The 1.1 cm focal asymmetry in the left breast central to the nipple anterior depth is indeterminate.   [name] Bilateral, 09/14/2017 at [facility] IMPRESSION: The 1 cm oval mass in the right breast at 9:30 posterior depth is highly suggestive of malignancy. The 0.8 cm round mass in the left breast at 11-12 o'clock anterior depth is suspicious of malignancy.    [name] Outside Films Breast 03/09/17  IMPRESSION: 1. No significant change in oval masses in the left breast at the 11:00 cm from the nipple and 10:30 4 cm from the nipple.  Both of these masses resemble the area of fat necrosis previously biopsied at 2:00. In addition, both of these masses have developing central  benign or dystrophic appearing calcifications and are favored to be other areas of fat necrosis.  A 6 month follow up bilateral mammogram and left breast US is recommended.  [name] Outside films Breast form [facility] 09/01/2016 IMPRESSION: Probably Benign 1. No significant change in oval hypoechoic masses in the left breast at 11:00 2 cm from the nipple and 10:30 4 cm from the nipple. Both of these masses resemble the area of fat necrosis previously biopsies at 2:00. In addition, both masses have developing central benign or dystrophic appearing calcifications and are favored to be other areas of fat necrosis. A 6 month follow-up left breast mammogram and ultrasound is recommended.  HM Mammogram from [facility] 09/01/16 IMPRESSION  Incomplete - additional imaging evaluation needed Ultrasound of the upper medial left breast mass is recommended.   Left breast mammogram and ultrasound 02/27/2016 Impression: probably benign The 4 mm oval mass in the left breast 12:00 position likely represent fat necrosis, the 3 mm mass in the left breast upper inner quadrant middle depth most likely is fat necrosis, the 4 mm oval cyst in the left breast upper inner quadrant posterior depth is benign. Post surgical scar in the left breast upper outer quadrant anterior depth is benign.  ASSESSMENT: 83 y.o. [place], [place] woman   1.  Breast cancer of upper outer quadrant of right breast, invasive ductal carcinoma, stage IB, cT1b, cN0, grade 2, Triple Negative, Ki67: 80% -Diagnostic mammogram with [name] on 09/14/2017 showed: The 1 cm oval mass in the left breast at 9:30 posterior depth is highly suggestive of malignancy. An Ultrasound guided biopsy is recommended. The 0.8 cm round mass in the left breast at 11-12 o'clock anterior depth is suspicious of malignancy. An ultrasound guided biopsy is recommended.  -Bilateral needle core biopsies on 09/22/2017 with results of: Breast, right, needle core biopsy with invasive ductal carcinoma, MSBR  Grade II/III. DCIS with necrosis. Breast, left, needle core biopsy with fat necrosis and no evidence of malignancy. -I reviewed pertient pathology and imaging with the patient today.  -We discussed that her newly diagnosed right breast is different from her previous left breast cancer. it is triple negative, more aggressive.  We discussed moderate to high risk of recurrence, and the role of adjuvant chemo. Due to her advanced age, she is not a candidate for intensive chemotherapy.  However she does have overall good health and performance status, she likely would be able to tolerate single agent Taxol or Abraxane if needed -I discussed that if on her pathology following her next surgery shows carcinoma more than 1 cm, or positive node, then I will offer adjuvant chemo.   -I will set up an urgent genetics appointment to be completed prior to surgery and if the genetics is positive, then I advised that the  the patient proceed with lumpectomy and sentinel node biopsy.  -Patient didn't have any issues with radiation for her prior breast cancer. I advised the patient that if she opts for lumpectomy alone, then she will be referred to a radiation oncologist for evaluation. She previously was evaluated and treated for her left breast cancer by Radiation Oncologist, Dr. Valere Dross who has retired at this time.  -I will refer the patient to a Radiation Oncologist.  -She will follow up with me following surgery  -She lives at [facility] and she exercises daily. She has two children in town. I advised the patient to remain active.  -Urgent referral to genetics and rad/onc  -F/u open    2. pT1bN0, stage IA invasive ductal carcinoma of the left breast, grade I, ER 100%, PR 88%, Ki-67 16%, HER-2/neu negative. -she underwent lumpectomy with sentinel lymph node biopsy on 09/12/2012, s/p radiation therapy from 11/09/2012 through 12/19/2012.  -She is clinically doing very well, her lab and physical exam today was  unremarkable. Her left breast biopsy in 08/2015 showed fat necrosis. No clinical evidence of recurrence. -Her repeated mammogram in May 2018 showed 2 lesions in UIQ of left breast, stable, likely benign (fat necrosis per biopsy in 2017) -Left breast mammogram and ultrasound on 03/09/2017 reveal hypoechoic masses that have developed central benign or dystrophic appearing calcifications and are favored to be areas of fat necrosis. -she is on adjuvant Aromasin, tolerating well, we'll continue, for total 5 years, until 11/2017  -09/16/17 US showed mass behind her left breast scar and a mass In her right breast, both highly sugestive of malignancy. She is to have bilateral biopsy next week. I will review results of biopsy and see her back if biopsy shows malignancy. -Her Exemestane was switched to letrozole in 08/2017 due to high copay. She is tolerating well.  -If her biopsy is negative we plan to stop letrozole in July 2019, if not negative I will see her sooner.  -Biopsy on 09/22/2017 showed: Right needle core biopsy with Invasive ductal carcinoma, MSBR Grade II/III. DCIS with necrosis. Left needle core biopsy with fat necrosis and no evidence of malignancy.  -She is taking letrozole at this time and tolerating this well.  -F/u open   3. Osteopenia -She is on vitamin D supplement daily. -Giving her worsening hypercalcemia, I told her to stop calcium  -Her recent bone density scan from 03/05/2015 showed osteopenia, with 10 year probability of major ostial parotic fracture 25%, and hip fracture 15%. Giving the high risk of fracture, she would benefit from bisphosphonate or Prolia injection. I previously discussed the benefit and potential side effects, and written material of Prolia was given to her on last visit  -She may have take biphosphonate in the past, she cannot remember for sure -She has not done Prolia injection yet and she is hesitant to due to the concern of side effects -Patient most recent DEXA  scan on 09/20/2017 showed: Osteopenia, very high risk of fracture, 29% for major osteoporotic fracture and 18% for hip fracture.  -will discuss adjuvant zometa after her breast cancer surgery   4. Hypercalcemia -Her calcium was 10.7 in 2016, 10.3 in 2018, since I reduced her calcium supplement -She knows to drink adequately and avoid dehydration  -She'll follow up with primary care physician for further workup.  -She was previously instructed to restart Vit D in 03/2017 as well sa continue light weight bearing exercise.  -Calcium normal today (09/20/17)  5. Diabetes and hyperglycemia  -She  noticed hyperglycemia since she started steroids for her left hip pain -I previously encouraged her to f/u with PCP to see if she needs to change her metformin medication or dosage as her sugar has been higher lately.  -I also previously suggested she stop taking her steroid medication as she no longer needs it.   Plan Urgent referral to genetics  Rad/onc referral to be seen ASAP F/u after her surgery, to determine adjuvant chemo     I spent 20 minutes counseling the patient face to face.  The total time spent in the appointment was 25 minutes.  I, Soijett Blue am acting as scribe for Dr. Truitt Merle.  I have reviewed the above documentation for accuracy and completeness, and I agree with the above.   Truitt Merle   10/08/2017

## 2017-10-11 ENCOUNTER — Inpatient Hospital Stay (HOSPITAL_BASED_OUTPATIENT_CLINIC_OR_DEPARTMENT_OTHER): Payer: Medicare Other | Admitting: Genetic Counselor

## 2017-10-11 ENCOUNTER — Other Ambulatory Visit: Payer: Self-pay

## 2017-10-11 ENCOUNTER — Ambulatory Visit
Admission: RE | Admit: 2017-10-11 | Discharge: 2017-10-11 | Disposition: A | Discharge status: Home / Self Care | Payer: Medicare Other | Source: Ambulatory Visit | Attending: Radiation Oncology | Admitting: Radiation Oncology

## 2017-10-11 ENCOUNTER — Encounter: Payer: Self-pay | Admitting: Genetic Counselor

## 2017-10-11 ENCOUNTER — Inpatient Hospital Stay: Payer: Medicare Other

## 2017-10-11 VITALS — BP 148/97 | HR 74 | Temp 97.9°F | Resp 18 | Ht 62.5 in | Wt 138.4 lb

## 2017-10-11 DIAGNOSIS — I10 Essential (primary) hypertension: Secondary | ICD-10-CM | POA: Diagnosis not present

## 2017-10-11 DIAGNOSIS — Z853 Personal history of malignant neoplasm of breast: Secondary | ICD-10-CM | POA: Diagnosis not present

## 2017-10-11 DIAGNOSIS — Z171 Estrogen receptor negative status [ER-]: Principal | ICD-10-CM

## 2017-10-11 DIAGNOSIS — M8580 Other specified disorders of bone density and structure, unspecified site: Secondary | ICD-10-CM | POA: Diagnosis not present

## 2017-10-11 DIAGNOSIS — I4891 Unspecified atrial fibrillation: Secondary | ICD-10-CM | POA: Insufficient documentation

## 2017-10-11 DIAGNOSIS — Z87891 Personal history of nicotine dependence: Secondary | ICD-10-CM | POA: Diagnosis not present

## 2017-10-11 DIAGNOSIS — E785 Hyperlipidemia, unspecified: Secondary | ICD-10-CM | POA: Insufficient documentation

## 2017-10-11 DIAGNOSIS — C50411 Malignant neoplasm of upper-outer quadrant of right female breast: Secondary | ICD-10-CM

## 2017-10-11 DIAGNOSIS — Z79811 Long term (current) use of aromatase inhibitors: Secondary | ICD-10-CM | POA: Insufficient documentation

## 2017-10-11 DIAGNOSIS — Z923 Personal history of irradiation: Secondary | ICD-10-CM | POA: Insufficient documentation

## 2017-10-11 DIAGNOSIS — Z315 Encounter for genetic counseling: Secondary | ICD-10-CM | POA: Diagnosis not present

## 2017-10-11 DIAGNOSIS — Z79899 Other long term (current) drug therapy: Secondary | ICD-10-CM | POA: Insufficient documentation

## 2017-10-11 DIAGNOSIS — Z803 Family history of malignant neoplasm of breast: Secondary | ICD-10-CM

## 2017-10-11 DIAGNOSIS — Z170 Estrogen receptor positive status [ER+]: Secondary | ICD-10-CM | POA: Insufficient documentation

## 2017-10-11 DIAGNOSIS — C50912 Malignant neoplasm of unspecified site of left female breast: Secondary | ICD-10-CM | POA: Diagnosis not present

## 2017-10-11 DIAGNOSIS — E119 Type 2 diabetes mellitus without complications: Secondary | ICD-10-CM | POA: Diagnosis not present

## 2017-10-11 DIAGNOSIS — Z809 Family history of malignant neoplasm, unspecified: Secondary | ICD-10-CM

## 2017-10-11 NOTE — Progress Notes (Signed)
REFERRING PROVIDER: Truitt Merle, MD [place], [place] [address]  PRIMARY PROVIDER:  [name], NP  PRIMARY REASON FOR VISIT:  1. Malignant neoplasm of upper-outer quadrant of right breast in female, estrogen receptor negative ([place])   2. Family history of cancer   3. History of breast cancer      HISTORY OF PRESENT ILLNESS:   Adrienne [name], a 83 y.o. female, was seen for a [place] cancer genetics consultation at the request of Dr. Burr Medico due to a personal and family history of cancer.  Adrienne [name] presents to clinic today to discuss the possibility of a hereditary predisposition to cancer, genetic testing, and to further clarify her future cancer risks, as well as potential cancer risks for family members.   In 2014, at the age of 41, Adrienne [name] was diagnosed with IDC of the left breast. This was treated with lumpectomy, radiation and letrozole.  The tumor was ER+/PR+/Her2-.  In 2019, at the age of 38, Adrienne [name] was diagnosed with triple negative breast cancer in the right breast.  Surgery is pending depending on the test results.     CANCER HISTORY:  Oncology History   Cancer Staging Breast cancer of upper-outer quadrant of right female breast [facility]) Staging form: Breast, AJCC 8th Edition - Clinical stage from 09/22/2017: Stage IB (cT1b, cN0, cM0, G2, ER-, PR-, HER2-) - Signed by Truitt Merle, MD on 10/08/2017  Breast cancer, left breast [facility]) Staging form: Breast, AJCC 7th Edition - Clinical stage from 09/12/2012: Stage IA (T1b, N0, M0) - Unsigned        Breast cancer, left breast ([place])   08/25/2012 Receptors her2    ER 100% positive, PR 88% positive, HER-2 negative, Ki-67 16%      08/30/2012 Initial Diagnosis    Breast cancer, left breast      09/12/2012 Pathology Results    T1bN0 Grade 1 invasive ductal carcinoma, and DCIS.      09/12/2012 Surgery    Left breast lumpectomy and sentinel lymph node biopsy, negative margins.      11/09/2012 - 12/13/2012  Radiation Therapy    Adjuvant breast radiation      12/2012 -  Anti-estrogen oral therapy    Anastrozole 1 mg daily, switched to Aromasin 25 mg daily in Jan 2015 due to diarrhea.      09/01/2016 Imaging    [name] Outside films Breast form [facility] 09/01/2016 IMPRESSION: Probably Benign 1. No significant change in oval hypoechoic masses in the left breast at 11:00 2 cm from the nipple and 10:30 4 cm from the nipple. Both of these masses resemble the area of fat necrosis previously biopsies at 2:00. In addition, both masses have developing central benign or dystrophic appearing calcifications and are favored to be other areas of fat necrosis. A 6 month follow-up left breast mammogram and ultrasound is recommended.      09/01/2016 Mammogram    [facility] 09/01/16 IMPRESSION  Incomplete - additional imaging evaluation needed Ultrasound of the upper medial left breast mass is recommended.       03/09/2017 Mammogram    Previous lumpectomy changes in the upper outer left breast anterior depth with stable associated dystrophic calcifications.  Biopsy clip at the 2:00 in the left breast near the lumpectomy site corresponding to previously biopsied benign fat necrosis.  Persistent round mass in the upper medial left breast with benign-appearing central round calcification.  No significant masses, calcifications, or other findings are seen in the  breast  Impression: ultrasound is recommended for the left breast      03/09/2017 Breast US    Left breast ultrasound impression:  No significant change in oval hypoechoic masses in the left breast at 11:00 2 cm from the nipple and 10:30 4 cm from the nipple.  Both of these masses resemble the area of fat necrosis previously biopsied at 2:00.  In addition, both masses have developing central benign or dystrophic appearing calcifications and are favored to be other areas of fat necrosis.  A 2-monthfollow-up bilateral mammogram and left breast ultrasound is  recommended.      09/14/2017 Breast [place]   Breast [place] 09[place]14[place] at [facility]  IMPRESSION:  The 1 cm oval mass in the left breast at 9:30 posterior depth is highly suggestive of malignancy. An Ultrasound guided biopsy is recommended.  The 0.8 cm round mass in the left breast at 11-12 o'clock anterior depth is suspicious of malignancy. An ultrasound guided biopsy is recommended.       09/22/2017 Pathology Results    Diagnosis 1. Breast, right, needle core biopsy - INVASIVE DUCTAL CARCINOMA, MSBR GRADE II/III. - DUCTAL CARCINOMA IN SITU WITH NECROSIS. 2. Breast, left, needle core biopsy - FAT NECROSIS. - NO EVIDENCE OF MALIGNANCY.       Breast cancer of upper-outer quadrant of right female breast ([place]   09/14/2017 Mammogram    Diagnostic mammogram at [facility]  IMPRESSION:  The new 0.9 cm oval high density mass in the right breast posterior depth superior region seen on the mediolateral oblique view only is indeterminate. The 1.1 cm focal asymmetry in the left breast central to the nipple anterior depth is indeterminate.       09/14/2017 Imaging    [place] [facility] IMPRESSION: The 1 cm oval mass in the right breast at 9:30 posterior depth is highly suggestive of malignancy. The 0.8 cm round mass in the left breast at 11-12 o'clock anterior depth is suspicious of malignancy.       09/22/2017 Cancer Staging    Staging form: Breast, AJCC 8th Edition - Clinical stage from 09/22/2017: Stage IB (cT1b, cN0, cM0, G2, ER-, PR-, HER2-) - Signed by FTruitt Merle MD on 10/08/2017      09/22/2017 Pathology Results    Bilateral needle core biopsy 1. Breast, right, needle core biopsy - INVASIVE DUCTAL CARCINOMA, MSBR GRADE II/III. - DUCTAL CARCINOMA IN SITU WITH NECROSIS. 2. Breast, left, needle core biopsy - FAT NECROSIS. - NO EVIDENCE OF MALIGNANCY.      10/08/2017 Initial Diagnosis    Breast cancer of upper[place]outer quadrant of right female breast ([place]        HORMONAL RISK FACTORS:   Menarche was at age 83  First live birth at age 83  OCP use for approximately 10 years.  Ovaries intact: no.  Hysterectomy: yes.  Menopausal status: postmenopausal.  HRT use: 0 and several years. Colonoscopy: yes; normal. Mammogram within the last year: yes. Number of breast biopsies: 3. Up to date with pelvic exams:  no. Any excessive radiation exposure in the past:  no  Past Medical History:  Diagnosis Date  . Allergy   . Atrial fibrillation ([place]   . Breast cancer ([place] 08/25/12   Invasive ductal ca,DCIS  . Diabetes mellitus    type II  . Family history of cancer   . Gout   . Hearing loss   . History of breast cancer   . History of frequent urinary tract infections   .  Hx of radiation therapy 11/22/12- 12/19/12   breast 4250 cGy 17 sessions, left breast boost 750 cGy 3 sessions  . Hyperlipidemia   . Hypertension   . Osteopenia     Past Surgical History:  Procedure Laterality Date  . ABDOMINAL HYSTERECTOMY Bilateral 1999   w/b/l salpingo-oopherectomy  . APPENDECTOMY    . BREAST LUMPECTOMY WITH NEEDLE LOCALIZATION AND AXILLARY SENTINEL LYMPH NODE BX Left 09/12/2012   Procedure: LEFT NEEDLE LOCALIZATION BREAST LUMPECTOMY TION AND AXILLARY SENTINEL LYMPH NODE BX;  Surgeon: Edward Jolly, MD;  Location: [facility];  Service: General;  Laterality: Left;  . BREAST SURGERY  1990's   right- fibroid cyst  . CHOLECYSTECTOMY    . SEPTOPLASTY    . TONSILLECTOMY      Social History   Socioeconomic History  . Marital status: Married    Spouse name: Not on file  . Number of children: 3  . Years of education: Not on file  . Highest education level: Not on file  Occupational History  . Not on file  Social Needs  . Financial resource strain: Not on file  . Food insecurity:    Worry: Not on file    Inability: Not on file  . Transportation needs:    Medical: Not on file    Non-medical: Not on file  Tobacco Use  . Smoking status: Former Smoker    Packs/day: 0.25     Years: 4.00    Pack years: 1.00    Types: Cigarettes  . Smokeless tobacco: Never Used  . Tobacco comment: Cigarette use was 50 years ago  Substance and Sexual Activity  . Alcohol use: Yes    Comment: Occ glass of wine with dinner  . Drug use: No  . Sexual activity: Never    Comment: menarche age 73, 60st birth age 65, G31, HRT x 7 years?  Lifestyle  . Physical activity:    Days per week: Not on file    Minutes per session: Not on file  . Stress: Not on file  Relationships  . Social connections:    Talks on phone: Not on file    Gets together: Not on file    Attends religious service: Not on file    Active member of club or organization: Not on file    Attends meetings of clubs or organizations: Not on file    Relationship status: Not on file  Other Topics Concern  . Not on file  Social History Narrative   Married - husband in memory care unit    Three children ( One lives at [place] and two in [place]      She likes to read and go to beach      FAMILY HISTORY:  We obtained a detailed, 4-generation family history.  Significant diagnoses are listed below: Family History  Problem Relation Age of Onset  . Hypertension Mother   . Heart disease Mother        Murmur and irregular heart disease  . Dementia Mother        d. 60  . Hypertension Father   . Aneurysm Father 64  . Breast cancer Cousin 41       mat first cousin    The patient has three children who are cancer free.  She is an only child.  Her parents are both deceased.  The patient's father died at 83 from an aneurysm.  He had two sisters and a brother who did not have cancer.  Her grandparents died of old age.  The patient's mother died at 50 from complications of dementia.  She had a full brother and sister and several paternal half siblings.  Her full sisters' daughter had breast cancer at 30.  The maternal grandparents are deceased from possibly old age.  Adrienne [name] is unaware of previous family history  of genetic testing for hereditary cancer risks. Patient's maternal ancestors are of Caucasian NOS descent, and paternal ancestors are of English descent. There is no reported Ashkenazi Jewish ancestry. There is no known consanguinity.  GENETIC COUNSELING ASSESSMENT: Adrienne [name] is a 83 y.o. female with a personal and family history of cancer which is somewhat suggestive of a familial risk and predisposition to cancer. We, therefore, discussed and recommended the following at today's visit.   DISCUSSION: We discussed that about 5-10% of breast cancer is hereditary with most cases due to BRCA mutations. Based on her personal and family history of breast cancer, we feel that her risk to harbor a deleterious mutation is low. We discussed that we can offer testing and that most likely her insurance would cover testing, or at least she would not be balanced billed.   We reviewed the characteristics, features and inheritance patterns of hereditary cancer syndromes. We also discussed genetic testing, including the appropriate family members to test, the process of testing, insurance coverage and turn-around-time for results. We discussed the implications of a negative, positive and/or variant of uncertain significant result. In order to get genetic test results in a timely manner so that Adrienne [name] can use these genetic test results for surgical decisions, we recommended Adrienne [name] pursue genetic testing for the STAT panel. If this test is negative, we then recommend Adrienne [name] pursue reflex genetic testing to the Multi cancer gene panel. The Multi-Gene Panel offered by [facility] includes sequencing and/or deletion duplication testing of the following 83 genes: ALK, APC, ATM, AXIN2,BAP1,  BARD1, BLM, BMPR1A, BRCA1, BRCA2, BRIP1, CASR, CDC73, CDH1, CDK4, CDKN1B, CDKN1C, CDKN2A (p14ARF), CDKN2A (p16INK4a), CEBPA, CHEK2, CTNNA1, DICER1, DIS3L2, EGFR (c.2369C>T, p.Thr790Met variant only), EPCAM (Deletion/duplication  testing only), FH, FLCN, GATA2, GPC3, GREM1 (Promoter region deletion/duplication testing only), HOXB13 (c.251G>A, p.Gly84Glu), HRAS, KIT, MAX, MEN1, MET, MITF (c.952G>A, p.Glu318Lys variant only), MLH1, MSH2, MSH3, MSH6, MUTYH, NBN, NF1, NF2, NTHL1, PALB2, PDGFRA, PHOX2B, PMS2, POLD1, POLE, POT1, PRKAR1A, PTCH1, PTEN, RAD50, RAD51C, RAD51D, RB1, RECQL4, RET, RUNX1, SDHAF2, SDHA (sequence changes only), SDHB, SDHC, SDHD, SMAD4, SMARCA4, SMARCB1, SMARCE1, STK11, SUFU, TERT, TERT, TMEM127, TP53, TSC1, TSC2, VHL, WRN and WT1.    PLAN: After considering the risks, benefits, and limitations, Adrienne [name]  provided informed consent to pursue genetic testing and the blood sample was sent to [facility] for analysis of the STAT and Multi cancer panel. Results should be available within approximately 2-3 weeks' time, at which point they will be disclosed by telephone to Adrienne [name], as will any additional recommendations warranted by these results. Adrienne [name] will receive a summary of her genetic counseling visit and a copy of her results once available. This information will also be available in Epic. We encouraged Adrienne [name] to remain in contact with cancer genetics annually so that we can continuously update the family history and inform her of any changes in cancer genetics and testing that may be of benefit for her family. Adrienne [name] questions were answered to her satisfaction today. Our contact information was provided should additional questions or concerns arise.  Lastly, we encouraged Adrienne [name] to remain in contact  with cancer genetics annually so that we can continuously update the family history and inform her of any changes in cancer genetics and testing that may be of benefit for this family.   Ms.  [name] questions were answered to her satisfaction today. Our contact information was provided should additional questions or concerns arise. Thank you for the referral and allowing [name] to share in the care  of your patient.   Adrienne [name] P. Florene Glen, [place], [facility] Certified Genetic Counselor [email or web address] phone: [phone]  The patient was seen for a total of 60 minutes in face-to-face genetic counseling.  This patient was discussed with Drs. Magrinat, Lindi Adie and/or Burr Medico who agrees with the above.    _______________________________________________________________________ For Office Staff:  Number of people involved in session: 1 Was an Intern/ student involved with case: no

## 2017-10-11 NOTE — Progress Notes (Signed)
Radiation Oncology         [phone]) [phone] ________________________________  Outpatient Re-Consultation  Name: TENNILE [name] MRN: [id number]  Date: 10/11/2017  DOB: 1934/11/17  UY:[id number], Tommi Rumps, NP  Excell Seltzer, MD   REFERRING PHYSICIAN: Excell Seltzer, MD  DIAGNOSIS:    ICD-10-CM   1. Malignant neoplasm of upper-outer quadrant of right breast in female, estrogen receptor negative ([place]) C50.411    Z17.1    Cancer Staging Breast cancer of upper-outer quadrant of right female breast [facility]) Staging form: Breast, AJCC 8th Edition - Clinical stage from 09/22/2017: Stage IB (cT1b, cN0, cM0, G2, ER-, PR-, HER2-) - Signed by Truitt Merle, MD on 10/08/2017  Breast cancer, left breast [facility]) Staging form: Breast, AJCC 7th Edition - Clinical stage from 09/12/2012: Stage IA (T1b, N0, M0) - Unsigned  CHIEF COMPLAINT: Here to discuss management of right breast cancer  HISTORY OF PRESENT ILLNESS::Adrienne [name] is a 83 y.o. female who completed lumpectomy and adjuvant hypofractionated RT (Dr Valere Dross) for T1N0 LEFT breast cancer in 2014 - 42.5 Gy in 17 fx, then boost of 7.5 Gy in 3 fx. Finished this in Aug 2014.  Dr Humphrey Rolls then started her on anti-estrogen therapy.  She did well with NED until she presented with a contralateral RIGHT breast cancer on mammography (61m mass in RIGHT posterior/ superior breast) and it was 1cm on [place] in the 930 position. Axilla negative on [place]  Biopsy of right breast mass showed triple negative breast cancer, grade II with DICS and characteristics as described above in the diagnosis.  Left breast mass (878m on [place] also biopsied, and showed fat necrosis.  Genetic testing is pending. If negative, she is intent on breast conservation.  She is in her USOH. [facility].  Husband has dementia - lives in another home - she visits him often in the PM.  Dines with her friends every evening at her residence.  Acknowledges stress r/t caring  for her husband, coping with his memory loss.  PREVIOUS RADIATION THERAPY: Yes adjuvant hypofractionated RT (Dr MuValere Drossfor T1N0 LEFT breast cancer in 2014 - 42.5 Gy in 17 fx, then boost of 7.5 Gy in 3 fx. Finished this in Aug 2014.  PAST MEDICAL HISTORY:  has a past medical history of Allergy, Atrial fibrillation ([place] ([place]), Diabetes mellitus, Family history of cancer, Gout, Hearing loss, History of breast cancer, History of frequent urinary tract infections, radiation therapy (11/22/12- 12/19/12), Hyperlipidemia, Hypertension, and Osteopenia.    PAST SURGICAL HISTORY: Past Surgical History:  Procedure Laterality Date  . ABDOMINAL HYSTERECTOMY Bilateral 1999   w/b/l salpingo-oopherectomy  . APPENDECTOMY    . BREAST LUMPECTOMY WITH NEEDLE LOCALIZATION AND AXILLARY SENTINEL LYMPH NODE BX Left 09/12/2012   Procedure: LEFT NEEDLE LOCALIZATION BREAST LUMPECTOMY TION AND AXILLARY SENTINEL LYMPH NODE BX;  Surgeon: BeEdward JollyMD;  [facility];  Laterality: Left;  . BREAST SURGERY  1990's   right- fibroid cyst  . CHOLECYSTECTOMY    . SEPTOPLASTY    . TONSILLECTOMY      FAMILY HISTORY: family history includes Aneurysm (age of onset: 4613in her father; Breast cancer (age of onset: 7287in her cousin; Dementia in her mother; [name]; [name]  SOCIAL HISTORY:  reports that she has quit smoking. Her smoking use included cigarettes. She has a 1.00 pack-year smoking history. She has never used smokeless tobacco. She  reports that she drinks alcohol. She reports that she does not use drugs.  ALLERGIES: Penicillins; Prednisone; and Radiaplexrx [woun'dres hydrogel wound dress]  MEDICATIONS:  Current Outpatient Medications  Medication Sig Dispense Refill  . allopurinol (ZYLOPRIM) 300 MG tablet TAKE 1 TABLET (300 MG TOTAL) BY MOUTH DAILY. **DNF 04/06/15** 90 tablet 3  . apixaban (ELIQUIS) 5 MG TABS  tablet Take 1 tablet (5 mg total) by mouth 2 (two) times daily. 180 tablet 3  . atenolol (TENORMIN) 25 MG tablet TAKE 1 TABLET (25 MG TOTAL) BY MOUTH DAILY. 30 tablet 10  . chlorpheniramine (CHLOR-TRIMETON) 4 MG tablet Take 4 mg 2 (two) times daily as needed by mouth for allergies.    Marland Kitchen diltiazem (CARDIZEM CD) 360 MG 24 hr capsule TAKE 1 CAPSULE (360 MG TOTAL) BY MOUTH DAILY. 90 capsule 3  . fish oil-omega-3 fatty acids 1000 MG capsule Take 1 g by mouth daily.    Marland Kitchen letrozole (FEMARA) 2.5 MG tablet TAKE 1 TABLET BY MOUTH EVERY DAY 30 tablet 2  . metFORMIN (GLUCOPHAGE) 500 MG tablet Take 1 tablet (500 mg total) by mouth 2 (two) times daily. 180 tablet 3  . Multiple Vitamin (MULTIVITAMIN) tablet Take 1 tablet by mouth daily.    Marland Kitchen OVER THE COUNTER MEDICATION Take by mouth 2 (two) times daily. Presavision- for macular degeneration prevention    . VITAMIN D, ERGOCALCIFEROL, PO Take by mouth.    Marland Kitchen ACCU-CHEK AVIVA PLUS test strip USE TO TEST TWICE DAILY **ICD-10 CODE E13.9** 200 each 3   No current facility-administered medications for this encounter.     REVIEW OF SYSTEMS: A 10+ POINT REVIEW OF SYSTEMS WAS OBTAINED including neurology, dermatology, psychiatry, cardiac, respiratory, lymph, extremities, GI, GU, Musculoskeletal, constitutional, breasts, reproductive, HEENT.  All pertinent positives are noted in the HPI.  All others are negative.   PHYSICAL EXAM:  height is 5' 2.5" (1.588 m) and weight is 138 lb 6.4 oz (62.8 kg). Her oral temperature is 97.9 F (36.6 C). Her blood pressure is 148/97 (abnormal) and her pulse is 74. Her respiration is 18 and oxygen saturation is 98%.   General: Alert and oriented, in no acute distress HEENT: Head is normocephalic. Extraocular movements are intact. Oropharynx is clear. Neck: Neck is supple, no palpable cervical or supraclavicular lymphadenopathy. Heart: Regular in rate and rhythm with no murmurs, rubs, or gallops. Chest: Clear to auscultation bilaterally,  with no rhonchi, wheezes, or rales. Abdomen: Soft, nontender, nondistended, with no rigidity or guarding. Extremities: No cyanosis or edema. Lymphatics: see Neck Exam Skin: No concerning lesions. Musculoskeletal: symmetric strength and muscle tone throughout. Neurologic: Cranial nerves II through XII are grossly intact. No obvious focalities. Speech is fluent. Coordination is intact. Psychiatric: Judgment and insight are intact. Affect is appropriate. Breasts: Left lumpectomy changes, left breast, where skin is puckered.  NO nodularity concerning for recurrence. No other palpable masses appreciated in the breasts or axillae.     ECOG = 1  0 - Asymptomatic (Fully active, able to carry on all predisease activities without restriction)  1 - Symptomatic but completely ambulatory (Restricted in physically strenuous activity but ambulatory and able to carry out work of a light or sedentary nature. For example, light housework, office work)  2 - Symptomatic, <50% in bed during the day (Ambulatory and capable of all self care but unable to carry out any work activities. Up and about more than 50% of waking hours)  3 - Symptomatic, >50% in bed, but not bedbound (Capable of only  limited self-care, confined to bed or chair 50% or more of waking hours)  4 - Bedbound (Completely disabled. Cannot carry on any self-care. Totally confined to bed or chair)  5 - Death   Eustace Pen MM, Creech RH, Tormey DC, et al. [phone]). "Toxicity and response criteria of the [facility]". [place] Oncol. 5 (6): 649-55   LABORATORY DATA:  Lab Results  Component Value Date   WBC 7.3 09/20/2017   HGB 14.7 09/20/2017   HCT 44.9 09/20/2017   MCV 96.8 09/20/2017   PLT 180 09/20/2017   CMP     Component Value Date/Time   NA 139 09/20/2017 0920   NA 143 06/24/2017 1002   NA 139 03/22/2017 0958   K 4.1 09/20/2017 0920   K 4.4 03/22/2017 0958   CL 107 09/20/2017 0920   CL 104 09/20/2012 1302     CO2 24 09/20/2017 0920   CO2 25 03/22/2017 0958   GLUCOSE 142 (H) 09/20/2017 0920   GLUCOSE 140 03/22/2017 0958   GLUCOSE 108 (H) 09/20/2012 1302   BUN 25 09/20/2017 0920   BUN 21 06/24/2017 1002   BUN 23.8 03/22/2017 0958   CREATININE 1.08 09/20/2017 0920   CREATININE 1.2 (H) 03/22/2017 0958   CALCIUM 9.7 09/20/2017 0920   CALCIUM 10.1 03/22/2017 0958   PROT 7.1 09/20/2017 0920   PROT 7.0 03/22/2017 0958   ALBUMIN 4.0 09/20/2017 0920   ALBUMIN 3.9 03/22/2017 0958   AST 20 09/20/2017 0920   AST 19 03/22/2017 0958   ALT 35 09/20/2017 0920   ALT 24 03/22/2017 0958   ALKPHOS 93 09/20/2017 0920   ALKPHOS 84 03/22/2017 0958   BILITOT 0.5 09/20/2017 0920   BILITOT 0.44 03/22/2017 0958   GFRNONAA 47 (L) 09/20/2017 0920   GFRAA 55 (L) 09/20/2017 0920         RADIOGRAPHY: as above     IMPRESSION/PLAN: lovely patient with new RIGHT breast cancer   It was a pleasure meeting the patient today. We discussed the risks, benefits, and side effects of radiotherapy. If lumpectomy is conducted, I will recommend radiotherapy to the right breast to reduce her risk of locoregional recurrence by 2/3.  We discussed that radiation would take approximately 4 weeks to complete and that I would give the patient a few weeks to heal following surgery before starting treatment planning.    Genetic testing is pending.  If chemotherapy were to be given, this would precede radiotherapy.  Per Dr Burr Medico, "discussed that if on her pathology following her next surgery shows carcinoma more than 1 cm, or positive node, then I will offer adjuvant chemo."   re: her LEFT breast cancer, tx'd over 4 years ago, "She is taking letrozole at this time and tolerating this well."  We spoke about acute effects including skin irritation and fatigue as well as much less common late effects including internal organ injury or irritation. We spoke about the latest technology that is used to minimize the risk of late effects for  patients undergoing radiotherapy to the breast or chest wall. No guarantees of treatment were given. The patient is enthusiastic about proceeding with treatment. I look forward to participating in the patient's care.  I will await her referral back to me for postoperative follow-up and eventual CT simulation/treatment planning.  Will refer to social worker due to stress r/t her husband's dementia.   She may thrive with some counseling.   __________________________________________   Eppie Gibson, MD

## 2017-10-12 ENCOUNTER — Other Ambulatory Visit: Payer: Self-pay | Admitting: Radiation Oncology

## 2017-10-12 ENCOUNTER — Encounter: Payer: Self-pay | Admitting: Radiation Oncology

## 2017-10-12 ENCOUNTER — Other Ambulatory Visit: Payer: Self-pay | Admitting: Adult Health

## 2017-10-12 ENCOUNTER — Telehealth: Payer: Self-pay | Admitting: Hematology

## 2017-10-12 DIAGNOSIS — Z171 Estrogen receptor negative status [ER-]: Principal | ICD-10-CM

## 2017-10-12 DIAGNOSIS — C50411 Malignant neoplasm of upper-outer quadrant of right female breast: Secondary | ICD-10-CM

## 2017-10-12 NOTE — Telephone Encounter (Signed)
Left message for patient regarding appointment date/time per 6/10 sch msg

## 2017-10-12 NOTE — Telephone Encounter (Signed)
Sent to the pharmacy by e-scribe. 

## 2017-10-14 ENCOUNTER — Telehealth: Payer: Self-pay | Admitting: Cardiology

## 2017-10-14 NOTE — Telephone Encounter (Signed)
Left a message to call back.

## 2017-10-14 NOTE — Telephone Encounter (Signed)
New Message:      Pt is calling to speak with RN about an upcoming surgery she is due to have.

## 2017-10-15 NOTE — Telephone Encounter (Signed)
Left message for pt to call.

## 2017-10-18 ENCOUNTER — Telehealth: Payer: Self-pay | Admitting: Genetic Counselor

## 2017-10-18 ENCOUNTER — Encounter: Payer: Self-pay | Admitting: Genetic Counselor

## 2017-10-18 DIAGNOSIS — Z1379 Encounter for other screening for genetic and chromosomal anomalies: Secondary | ICD-10-CM | POA: Insufficient documentation

## 2017-10-18 NOTE — Telephone Encounter (Signed)
LM on VM with good news.  Asked that she please CB. 

## 2017-10-19 ENCOUNTER — Ambulatory Visit: Payer: Self-pay | Admitting: Genetic Counselor

## 2017-10-19 ENCOUNTER — Telehealth: Payer: Self-pay | Admitting: *Deleted

## 2017-10-19 DIAGNOSIS — C50412 Malignant neoplasm of upper-outer quadrant of left female breast: Secondary | ICD-10-CM

## 2017-10-19 DIAGNOSIS — Z809 Family history of malignant neoplasm, unspecified: Secondary | ICD-10-CM

## 2017-10-19 DIAGNOSIS — Z853 Personal history of malignant neoplasm of breast: Secondary | ICD-10-CM

## 2017-10-19 DIAGNOSIS — Z1379 Encounter for other screening for genetic and chromosomal anomalies: Secondary | ICD-10-CM

## 2017-10-19 DIAGNOSIS — Z170 Estrogen receptor positive status [ER+]: Secondary | ICD-10-CM

## 2017-10-19 NOTE — Progress Notes (Signed)
HPI:  Adrienne [name] was previously seen in the [place] clinic due to a personal and family history of cancer and concerns regarding a hereditary predisposition to cancer. Please refer to our prior cancer genetics clinic note for more information regarding Adrienne [name]'s medical, social and family histories, and our assessment and recommendations, at the time. Adrienne [name] recent genetic test results were disclosed to her, as were recommendations warranted by these results. These results and recommendations are discussed in more detail below.  CANCER HISTORY:  Oncology History   Cancer Staging Breast cancer of upper-outer quadrant of right female breast [facility]) Staging form: Breast, AJCC 8th Edition - Clinical stage from 09/22/2017: Stage IB (cT1b, cN0, cM0, G2, ER-, PR-, HER2-) - Signed by Truitt Merle, MD on 10/08/2017  Breast cancer, left breast [facility]) Staging form: Breast, AJCC 7th Edition - Clinical stage from 09/12/2012: Stage IA (T1b, N0, M0) - Unsigned        Breast cancer, left breast ([place])   08/25/2012 Receptors her2    ER 100% positive, PR 88% positive, HER-2 negative, Ki-67 16%      08/30/2012 Initial Diagnosis    Breast cancer, left breast      09/12/2012 Pathology Results    T1bN0 Grade 1 invasive ductal carcinoma, and DCIS.      09/12/2012 Surgery    Left breast lumpectomy and sentinel lymph node biopsy, negative margins.      11/09/2012 - 12/13/2012 Radiation Therapy    Adjuvant breast radiation      12/2012 -  Anti-estrogen oral therapy    Anastrozole 1 mg daily, switched to Aromasin 25 mg daily in Jan 2015 due to diarrhea.      09/01/2016 Imaging    [name] Outside films Breast form [facility] 09/01/2016 IMPRESSION: Probably Benign 1. No significant change in oval hypoechoic masses in the left breast at 11:00 2 cm from the nipple and 10:30 4 cm from the nipple. Both of these masses resemble the area of fat necrosis previously biopsies at 2:00. In addition, both masses have  developing central benign or dystrophic appearing calcifications and are favored to be other areas of fat necrosis. A 6 month follow-up left breast mammogram and ultrasound is recommended.      09/01/2016 Mammogram    [facility] Mammogram from [facility] 09/01/16 IMPRESSION  Incomplete - additional imaging evaluation needed Ultrasound of the upper medial left breast mass is recommended.       03/09/2017 Mammogram    Previous lumpectomy changes in the upper outer left breast anterior depth with stable associated dystrophic calcifications.  Biopsy clip at the 2:00 in the left breast near the lumpectomy site corresponding to previously biopsied benign fat necrosis.  Persistent round mass in the upper medial left breast with benign-appearing central round calcification.  No significant masses, calcifications, or other findings are seen in the breast  Impression: ultrasound is recommended for the left breast      03/09/2017 Breast US    Left breast ultrasound impression:  No significant change in oval hypoechoic masses in the left breast at 11:00 2 cm from the nipple and 10:30 4 cm from the nipple.  Both of these masses resemble the area of fat necrosis previously biopsied at 2:00.  In addition, both masses have developing central benign or dystrophic appearing calcifications and are favored to be other areas of fat necrosis.  A 38-monthfollow-up bilateral mammogram and left breast ultrasound is recommended.      09/14/2017 Breast [place]  Breast US Bilateral 09/14/17 at [facility]  IMPRESSION:  The 1 cm oval mass in the left breast at 9:30 posterior depth is highly suggestive of malignancy. An Ultrasound guided biopsy is recommended.  The 0.8 cm round mass in the left breast at 11-12 o'clock anterior depth is suspicious of malignancy. An ultrasound guided biopsy is recommended.       09/22/2017 Pathology Results    Diagnosis 1. Breast, right, needle core biopsy - INVASIVE DUCTAL CARCINOMA, MSBR GRADE II/III. -  DUCTAL CARCINOMA IN SITU WITH NECROSIS. 2. Breast, left, needle core biopsy - FAT NECROSIS. - NO EVIDENCE OF MALIGNANCY.      10/17/2017 Genetic Testing    Negative genetic testing on the Multicancer panel.  The Multi-Gene Panel offered by [facility] includes sequencing and/or deletion duplication testing of the following 83 genes: ALK, APC, ATM, AXIN2,BAP1,  BARD1, BLM, BMPR1A, BRCA1, BRCA2, BRIP1, CASR, CDC73, CDH1, CDK4, CDKN1B, CDKN1C, CDKN2A (p14ARF), CDKN2A (p16INK4a), CEBPA, CHEK2, CTNNA1, DICER1, DIS3L2, EGFR (c.2369C>T, p.Thr790Met variant only), EPCAM (Deletion/duplication testing only), FH, FLCN, GATA2, GPC3, GREM1 (Promoter region deletion/duplication testing only), HOXB13 (c.251G>A, p.Gly84Glu), HRAS, KIT, MAX, MEN1, MET, MITF (c.952G>A, p.Glu318Lys variant only), MLH1, MSH2, MSH3, MSH6, MUTYH, NBN, NF1, NF2, NTHL1, PALB2, PDGFRA, PHOX2B, PMS2, POLD1, POLE, POT1, PRKAR1A, PTCH1, PTEN, RAD50, RAD51C, RAD51D, RB1, RECQL4, RET, RUNX1, SDHAF2, SDHA (sequence changes only), SDHB, SDHC, SDHD, SMAD4, SMARCA4, SMARCB1, SMARCE1, STK11, SUFU, TERT, TERT, TMEM127, TP53, TSC1, TSC2, VHL, WRN and WT1.  The report date is October 17, 2017.       Breast cancer of upper-outer quadrant of right female breast ([place])   09/14/2017 Mammogram    Diagnostic mammogram at [facility]  IMPRESSION:  The new 0.9 cm oval high density mass in the right breast posterior depth superior region seen on the mediolateral oblique view only is indeterminate. The 1.1 cm focal asymmetry in the left breast central to the nipple anterior depth is indeterminate.       09/14/2017 Imaging    [name] Bilateral at [facility] IMPRESSION: The 1 cm oval mass in the right breast at 9:30 posterior depth is highly suggestive of malignancy. The 0.8 cm round mass in the left breast at 11-12 o'clock anterior depth is suspicious of malignancy.       09/22/2017 Cancer Staging    Staging form: Breast, AJCC 8th Edition - Clinical stage from 09/22/2017: Stage IB  (cT1b, cN0, cM0, G2, ER-, PR-, HER2-) - Signed by Truitt Merle, MD on 10/08/2017      09/22/2017 Pathology Results    Bilateral needle core biopsy 1. Breast, right, needle core biopsy - INVASIVE DUCTAL CARCINOMA, MSBR GRADE II/III. - DUCTAL CARCINOMA IN SITU WITH NECROSIS. 2. Breast, left, needle core biopsy - FAT NECROSIS. - NO EVIDENCE OF MALIGNANCY.      10/08/2017 Initial Diagnosis    Breast cancer of upper-outer quadrant of right female breast ([place])       FAMILY HISTORY:  We obtained a detailed, 4-generation family history.  Significant diagnoses are listed below: Family History  Problem Relation Age of Onset  . Hypertension Mother   . Heart disease Mother        Murmur and irregular heart disease  . Dementia Mother        d. 87  . Hypertension Father   . Aneurysm Father 48  . Breast cancer Cousin 41       mat first cousin    The patient has three children who are cancer free.  She is an only  child.  Her parents are both deceased.  The patient's father died at 14 from an aneurysm.  He had two sisters and a brother who did not have cancer.  Her grandparents died of old age.  The patient's mother died at 47 from complications of dementia.  She had a full brother and sister and several paternal half siblings.  Her full sisters' daughter had breast cancer at 62.  The maternal grandparents are deceased from possibly old age.  Adrienne [name] is unaware of previous family history of genetic testing for hereditary cancer risks. Patient's maternal ancestors are of Caucasian NOS descent, and paternal ancestors are of English descent. There is no reported Ashkenazi Jewish ancestry. There is no known consanguinity.  GENETIC TEST RESULTS: Genetic testing reported out on October 18, 2017 through the multi-cancer panel found no deleterious mutations.  The Multi-Gene Panel offered by [facility] includes sequencing and/or deletion duplication testing of the following 83 genes: ALK, APC, ATM,  AXIN2,BAP1,  BARD1, BLM, BMPR1A, BRCA1, BRCA2, BRIP1, CASR, CDC73, CDH1, CDK4, CDKN1B, CDKN1C, CDKN2A (p14ARF), CDKN2A (p16INK4a), CEBPA, CHEK2, CTNNA1, DICER1, DIS3L2, EGFR (c.2369C>T, p.Thr790Met variant only), EPCAM (Deletion/duplication testing only), FH, FLCN, GATA2, GPC3, GREM1 (Promoter region deletion/duplication testing only), HOXB13 (c.251G>A, p.Gly84Glu), HRAS, KIT, MAX, MEN1, MET, MITF (c.952G>A, p.Glu318Lys variant only), MLH1, MSH2, MSH3, MSH6, MUTYH, NBN, NF1, NF2, NTHL1, PALB2, PDGFRA, PHOX2B, PMS2, POLD1, POLE, POT1, PRKAR1A, PTCH1, PTEN, RAD50, RAD51C, RAD51D, RB1, RECQL4, RET, RUNX1, SDHAF2, SDHA (sequence changes only), SDHB, SDHC, SDHD, SMAD4, SMARCA4, SMARCB1, SMARCE1, STK11, SUFU, TERT, TERT, TMEM127, TP53, TSC1, TSC2, VHL, WRN and WT1.   The test report has been scanned into EPIC and is located under the Molecular Pathology section of the Results Review tab.    We discussed with Adrienne [name] that since the current genetic testing is not perfect, it is possible there may be a gene mutation in one of these genes that current testing cannot detect, but that chance is small.  We also discussed, that it is possible that another gene that has not yet been discovered, or that we have not yet tested, is responsible for the cancer diagnoses in the family, and it is, therefore, important to remain in touch with cancer genetics in the future so that we can continue to offer Adrienne [name] the most up to date genetic testing.    CANCER SCREENING RECOMMENDATIONS: This result is reassuring and indicates that Adrienne [name] likely does not have an increased risk for a future cancer due to a mutation in one of these genes. This normal test also suggests that Adrienne [name]'s cancer was most likely not due to an inherited predisposition associated with one of these genes.  Most cancers happen by chance and this negative test suggests that her cancer falls into this category.  We, therefore, recommended she continue to  follow the cancer management and screening guidelines provided by her oncology and primary healthcare provider.   An individual's cancer risk and medical management are not determined by genetic test results alone. Overall cancer risk assessment incorporates additional factors, including personal medical history, family history, and any available genetic information that may result in a personalized plan for cancer prevention and surveillance.  RECOMMENDATIONS FOR FAMILY MEMBERS:  Women in this family might be at some increased risk of developing cancer, over the general population risk, simply due to the family history of cancer.  We recommended women in this family have a yearly mammogram beginning at age 52, or 32 years younger than the earliest  onset of cancer, an annual clinical breast exam, and perform monthly breast self-exams. Women in this family should also have a gynecological exam as recommended by their primary provider. All family members should have a colonoscopy by age 68.  FOLLOW-UP: Lastly, we discussed with Adrienne [name] that cancer genetics is a rapidly advancing field and it is possible that new genetic tests will be appropriate for her and/or her family members in the future. We encouraged her to remain in contact with cancer genetics on an annual basis so we can update her personal and family histories and let her know of advances in cancer genetics that may benefit this family.   Our contact number was provided. Adrienne [name] questions were answered to her satisfaction, and she knows she is welcome to call us at anytime with additional questions or concerns.   Roma Kayser, MS, [facility] Certified Genetic Counselor [email or web address]

## 2017-10-19 NOTE — Telephone Encounter (Signed)
Revealed negative genetic testing.  Discussed that we do not know why she has breast cancer or why there is cancer in the family. It could be due to a different gene that we are not testing, or maybe our current technology may not be able to pick something up.  It will be important for her to keep in contact with genetics to keep up with whether additional testing may be needed. 

## 2017-10-19 NOTE — Telephone Encounter (Signed)
Spoke with pt, she is having breast surgery on 10-26-17 and she was told to stop her eliquis. No clearance noterd in chart, patient will contact her provider and have a clearance sent to [name].

## 2017-10-19 NOTE — Telephone Encounter (Signed)
   [facility] Pre-operative Risk Assessment    Request for surgical clearance:  1. What type of surgery is being performed? BREAST PROCEDURE- RODIACTIVE SEED LOCALIZED RIGHT BREAST LUMPECTOMY AND AXILLARY SENTINEL LYMPH NODE BIOPSY    2. When is this surgery scheduled? 10/26/17   3. What type of clearance is required (medical clearance vs. Pharmacy clearance to hold med vs. Both)? BOTH   4. Are there any medications that need to be held prior to surgery and how long?ELIQUIS    5. Practice name and name of physician performing surgery? [facility]WOR[facility]   6. What is your office phone number? [phone]     7.   What is your office fax number? [place] [facility]   8.   Anesthesia type (None, local, MAC, general) ? GENERAL ANESTHESIA

## 2017-10-19 NOTE — Telephone Encounter (Signed)
Left message for pt to call.

## 2017-10-20 ENCOUNTER — Encounter (HOSPITAL_BASED_OUTPATIENT_CLINIC_OR_DEPARTMENT_OTHER): Payer: Self-pay | Admitting: *Deleted

## 2017-10-20 ENCOUNTER — Other Ambulatory Visit: Payer: Self-pay

## 2017-10-20 NOTE — Telephone Encounter (Signed)
Patient with diagnosis of Afib on Eliquis for anticoagulation.    Procedure: breast lumpectomy and axillary lymph node biopsy Date of procedure: 10/26/17  CHADS2-VASc score of  5 (CHF, HTN, AGE, DM2, stroke/tia x 2, CAD, AGE, female)  CrCl 73ml/min  Per office protocol, patient can hold Eliquis for 2 days prior to procedure.

## 2017-10-20 NOTE — Telephone Encounter (Signed)
   Primary Cardiologist: Kirk Ruths, MD  Chart reviewed as part of pre-operative protocol coverage. Patient was contacted 10/20/2017 in reference to pre-operative risk assessment for pending surgery as outlined below.  Adrienne [name] was last seen on 06/24/2017 by Dr. Stanford Breed.  Since that day, Adrienne [name] has done well. She has permanent atrial fibrillation, rate controlled on cardizem and atenolol, asymptomatic.   Therefore, based on ACC/AHA guidelines, the patient would be at acceptable risk for the planned procedure without further cardiovascular testing.   According to our pharmacy protocol: CHADS2-VASc score of  5 (CHF, HTN, AGE, DM2, stroke/tia x 2, CAD, AGE, female)  CrCl 64ml/min  Per office protocol, patient can hold Eliquis for 2 days prior to procedure.    I will route this recommendation to the requesting party via Epic fax function and remove from pre-op pool.  Please call with questions.  Daune Perch, NP 10/20/2017, 1:28 PM

## 2017-10-21 ENCOUNTER — Encounter (HOSPITAL_BASED_OUTPATIENT_CLINIC_OR_DEPARTMENT_OTHER)
Admission: RE | Admit: 2017-10-21 | Discharge: 2017-10-21 | Disposition: A | Discharge status: Home / Self Care | Payer: Medicare Other | Source: Ambulatory Visit | Attending: General Surgery | Admitting: General Surgery

## 2017-10-21 DIAGNOSIS — Z01812 Encounter for preprocedural laboratory examination: Secondary | ICD-10-CM | POA: Diagnosis present

## 2017-10-21 LAB — BASIC METABOLIC PANEL
Anion gap: 9 (ref 5–15)
BUN: 25 mg/dL — AB (ref 6–20)
CALCIUM: 9.9 mg/dL (ref 8.9–10.3)
CO2: 27 mmol/L (ref 22–32)
CREATININE: 1.45 mg/dL — AB (ref 0.44–1.00)
Chloride: 104 mmol/L (ref 101–111)
GFR calc Af Amer: 38 mL/min — ABNORMAL LOW (ref >60)
GFR, EST NON AFRICAN AMERICAN: 33 mL/min — AB (ref >60)
GLUCOSE: 221 mg/dL — AB (ref 65–99)
Potassium: 4.4 mmol/L (ref 3.5–5.1)
Sodium: 140 mmol/L (ref 135–145)

## 2017-10-21 NOTE — Progress Notes (Addendum)
Ensure pre surgery drink given with instructions to complete by 0700 dos, surgical soap given with instructions, pt verbalized understanding.  Lab results reviewed by Dr. Royce Macadamia, will obtain ISTAT day of surgery per Dr. Royce Macadamia.

## 2017-10-25 ENCOUNTER — Encounter: Payer: Self-pay | Admitting: Hematology

## 2017-10-26 ENCOUNTER — Encounter (HOSPITAL_BASED_OUTPATIENT_CLINIC_OR_DEPARTMENT_OTHER)
Admission: RE | Disposition: A | Discharge status: Home / Self Care | Payer: Self-pay | Source: Ambulatory Visit | Attending: General Surgery

## 2017-10-26 ENCOUNTER — Encounter (HOSPITAL_BASED_OUTPATIENT_CLINIC_OR_DEPARTMENT_OTHER): Payer: Self-pay | Admitting: Anesthesiology

## 2017-10-26 ENCOUNTER — Ambulatory Visit (HOSPITAL_BASED_OUTPATIENT_CLINIC_OR_DEPARTMENT_OTHER): Payer: Medicare Other | Admitting: Anesthesiology

## 2017-10-26 ENCOUNTER — Ambulatory Visit (HOSPITAL_BASED_OUTPATIENT_CLINIC_OR_DEPARTMENT_OTHER)
Admission: RE | Admit: 2017-10-26 | Discharge: 2017-10-26 | Disposition: A | Discharge status: Home / Self Care | Payer: Medicare Other | Source: Ambulatory Visit | Attending: General Surgery | Admitting: General Surgery

## 2017-10-26 ENCOUNTER — Other Ambulatory Visit: Payer: Self-pay

## 2017-10-26 ENCOUNTER — Ambulatory Visit (HOSPITAL_COMMUNITY)
Admission: RE | Admit: 2017-10-26 | Discharge: 2017-10-26 | Disposition: A | Discharge status: Home / Self Care | Payer: Medicare Other | Source: Ambulatory Visit | Attending: General Surgery | Admitting: General Surgery

## 2017-10-26 DIAGNOSIS — Z79899 Other long term (current) drug therapy: Secondary | ICD-10-CM | POA: Insufficient documentation

## 2017-10-26 DIAGNOSIS — Z9049 Acquired absence of other specified parts of digestive tract: Secondary | ICD-10-CM | POA: Diagnosis not present

## 2017-10-26 DIAGNOSIS — Z7901 Long term (current) use of anticoagulants: Secondary | ICD-10-CM | POA: Insufficient documentation

## 2017-10-26 DIAGNOSIS — Z888 Allergy status to other drugs, medicaments and biological substances status: Secondary | ICD-10-CM | POA: Diagnosis not present

## 2017-10-26 DIAGNOSIS — E118 Type 2 diabetes mellitus with unspecified complications: Secondary | ICD-10-CM | POA: Insufficient documentation

## 2017-10-26 DIAGNOSIS — Z171 Estrogen receptor negative status [ER-]: Secondary | ICD-10-CM | POA: Insufficient documentation

## 2017-10-26 DIAGNOSIS — C50911 Malignant neoplasm of unspecified site of right female breast: Secondary | ICD-10-CM

## 2017-10-26 DIAGNOSIS — Z880 Allergy status to penicillin: Secondary | ICD-10-CM | POA: Diagnosis not present

## 2017-10-26 DIAGNOSIS — Z87891 Personal history of nicotine dependence: Secondary | ICD-10-CM | POA: Insufficient documentation

## 2017-10-26 DIAGNOSIS — Z8249 Family history of ischemic heart disease and other diseases of the circulatory system: Secondary | ICD-10-CM | POA: Diagnosis not present

## 2017-10-26 DIAGNOSIS — I10 Essential (primary) hypertension: Secondary | ICD-10-CM | POA: Insufficient documentation

## 2017-10-26 DIAGNOSIS — E7800 Pure hypercholesterolemia, unspecified: Secondary | ICD-10-CM | POA: Diagnosis not present

## 2017-10-26 DIAGNOSIS — Z853 Personal history of malignant neoplasm of breast: Secondary | ICD-10-CM | POA: Insufficient documentation

## 2017-10-26 DIAGNOSIS — C50411 Malignant neoplasm of upper-outer quadrant of right female breast: Secondary | ICD-10-CM | POA: Diagnosis present

## 2017-10-26 DIAGNOSIS — Z7984 Long term (current) use of oral hypoglycemic drugs: Secondary | ICD-10-CM | POA: Diagnosis not present

## 2017-10-26 HISTORY — PX: BREAST LUMPECTOMY WITH RADIOACTIVE SEED AND SENTINEL LYMPH NODE BIOPSY: SHX6550

## 2017-10-26 LAB — GLUCOSE, CAPILLARY: Glucose-Capillary: 200 mg/dL — ABNORMAL HIGH (ref 70–99)

## 2017-10-26 LAB — POCT I-STAT, CHEM 8
BUN: 29 mg/dL — ABNORMAL HIGH (ref 8–23)
CALCIUM ION: 1.26 mmol/L (ref 1.15–1.40)
CHLORIDE: 104 mmol/L (ref 98–111)
Creatinine, Ser: 1 mg/dL (ref 0.44–1.00)
Glucose, Bld: 182 mg/dL — ABNORMAL HIGH (ref 70–99)
HEMATOCRIT: 45 % (ref 36.0–46.0)
Hemoglobin: 15.3 g/dL — ABNORMAL HIGH (ref 12.0–15.0)
POTASSIUM: 4 mmol/L (ref 3.5–5.1)
SODIUM: 139 mmol/L (ref 135–145)
TCO2: 26 mmol/L (ref 22–32)

## 2017-10-26 SURGERY — BREAST LUMPECTOMY WITH RADIOACTIVE SEED AND SENTINEL LYMPH NODE BIOPSY
Anesthesia: General | Site: Breast | Laterality: Right

## 2017-10-26 MED ORDER — LIDOCAINE HCL (CARDIAC) PF 100 MG/5ML IV SOSY
PREFILLED_SYRINGE | INTRAVENOUS | Status: AC
  Filled 2017-10-26: qty 5

## 2017-10-26 MED ORDER — LIDOCAINE HCL (CARDIAC) PF 100 MG/5ML IV SOSY
PREFILLED_SYRINGE | INTRAVENOUS | Status: DC | PRN
  Administered 2017-10-26: 100 mg via INTRAVENOUS

## 2017-10-26 MED ORDER — BUPIVACAINE-EPINEPHRINE (PF) 0.5% -1:200000 IJ SOLN
INTRAMUSCULAR | Status: AC
  Filled 2017-10-26: qty 30

## 2017-10-26 MED ORDER — ACETAMINOPHEN 500 MG PO TABS
ORAL_TABLET | ORAL | Status: AC
  Filled 2017-10-26: qty 2

## 2017-10-26 MED ORDER — SODIUM CHLORIDE 0.9 % IJ SOLN
INTRAMUSCULAR | Status: AC
  Filled 2017-10-26: qty 10

## 2017-10-26 MED ORDER — BUPIVACAINE-EPINEPHRINE (PF) 0.5% -1:200000 IJ SOLN
INTRAMUSCULAR | Status: DC | PRN
  Administered 2017-10-26: 10 mL

## 2017-10-26 MED ORDER — EPHEDRINE SULFATE 50 MG/ML IJ SOLN
INTRAMUSCULAR | Status: DC | PRN
  Administered 2017-10-26 (×5): 15 mg via INTRAVENOUS

## 2017-10-26 MED ORDER — FENTANYL CITRATE (PF) 100 MCG/2ML IJ SOLN: 25.0000 ug | INTRAMUSCULAR | Status: DC | PRN

## 2017-10-26 MED ORDER — PHENYLEPHRINE 40 MCG/ML (10ML) SYRINGE FOR IV PUSH (FOR BLOOD PRESSURE SUPPORT)
PREFILLED_SYRINGE | INTRAVENOUS | Status: AC
  Filled 2017-10-26: qty 10

## 2017-10-26 MED ORDER — TRAMADOL HCL 50 MG PO TABS: 50.0000 mg | ORAL_TABLET | Freq: Four times a day (QID) | ORAL | 1 refills | Status: DC | PRN

## 2017-10-26 MED ORDER — FENTANYL CITRATE (PF) 100 MCG/2ML IJ SOLN
INTRAMUSCULAR | Status: DC | PRN
  Administered 2017-10-26 (×2): 25 ug via INTRAVENOUS
  Administered 2017-10-26: 50 ug via INTRAVENOUS

## 2017-10-26 MED ORDER — CHLORHEXIDINE GLUCONATE CLOTH 2 % EX PADS: 6.0000 | MEDICATED_PAD | Freq: Once | CUTANEOUS | Status: DC

## 2017-10-26 MED ORDER — PROPOFOL 10 MG/ML IV BOLUS
INTRAVENOUS | Status: AC
  Filled 2017-10-26: qty 20

## 2017-10-26 MED ORDER — DEXAMETHASONE SODIUM PHOSPHATE 4 MG/ML IJ SOLN
INTRAMUSCULAR | Status: DC | PRN
  Administered 2017-10-26: 10 mg via INTRAVENOUS

## 2017-10-26 MED ORDER — FENTANYL CITRATE (PF) 100 MCG/2ML IJ SOLN
INTRAMUSCULAR | Status: AC
  Filled 2017-10-26: qty 2

## 2017-10-26 MED ORDER — PHENYLEPHRINE HCL 10 MG/ML IJ SOLN
INTRAMUSCULAR | Status: DC | PRN
  Administered 2017-10-26 (×5): 80 ug via INTRAVENOUS

## 2017-10-26 MED ORDER — SCOPOLAMINE 1 MG/3DAYS TD PT72: 1.0000 | MEDICATED_PATCH | Freq: Once | TRANSDERMAL | Status: DC | PRN

## 2017-10-26 MED ORDER — ONDANSETRON HCL 4 MG/2ML IJ SOLN
INTRAMUSCULAR | Status: AC
  Filled 2017-10-26: qty 2

## 2017-10-26 MED ORDER — PROPOFOL 500 MG/50ML IV EMUL
INTRAVENOUS | Status: DC | PRN
  Administered 2017-10-26 (×2): 25 ug/kg/min via INTRAVENOUS

## 2017-10-26 MED ORDER — CIPROFLOXACIN IN D5W 400 MG/200ML IV SOLN
400.0000 mg | INTRAVENOUS | Status: AC
  Administered 2017-10-26 (×2): 400 mg via INTRAVENOUS

## 2017-10-26 MED ORDER — ACETAMINOPHEN 500 MG PO TABS
1000.0000 mg | ORAL_TABLET | ORAL | Status: AC
  Administered 2017-10-26: 1000 mg via ORAL

## 2017-10-26 MED ORDER — MIDAZOLAM HCL 2 MG/2ML IJ SOLN: 1.0000 mg | INTRAMUSCULAR | Status: DC | PRN

## 2017-10-26 MED ORDER — PROPOFOL 10 MG/ML IV BOLUS
INTRAVENOUS | Status: DC | PRN
  Administered 2017-10-26: 100 mg via INTRAVENOUS

## 2017-10-26 MED ORDER — FENTANYL CITRATE (PF) 100 MCG/2ML IJ SOLN
50.0000 ug | INTRAMUSCULAR | Status: DC | PRN
  Administered 2017-10-26: 50 ug via INTRAVENOUS

## 2017-10-26 MED ORDER — DEXAMETHASONE SODIUM PHOSPHATE 10 MG/ML IJ SOLN
INTRAMUSCULAR | Status: AC
  Filled 2017-10-26: qty 1

## 2017-10-26 MED ORDER — PROPOFOL 500 MG/50ML IV EMUL
INTRAVENOUS | Status: AC
  Filled 2017-10-26: qty 50

## 2017-10-26 MED ORDER — PROMETHAZINE HCL 25 MG/ML IJ SOLN: 6.2500 mg | INTRAMUSCULAR | Status: DC | PRN

## 2017-10-26 MED ORDER — CELECOXIB 200 MG PO CAPS
200.0000 mg | ORAL_CAPSULE | ORAL | Status: AC
  Administered 2017-10-26: 200 mg via ORAL

## 2017-10-26 MED ORDER — MEPERIDINE HCL 25 MG/ML IJ SOLN: 6.2500 mg | INTRAMUSCULAR | Status: DC | PRN

## 2017-10-26 MED ORDER — LACTATED RINGERS IV SOLN
INTRAVENOUS | Status: DC
  Administered 2017-10-26: 10:00:00 via INTRAVENOUS

## 2017-10-26 MED ORDER — ONDANSETRON HCL 4 MG/2ML IJ SOLN
INTRAMUSCULAR | Status: DC | PRN
  Administered 2017-10-26: 4 mg via INTRAVENOUS

## 2017-10-26 MED ORDER — CELECOXIB 200 MG PO CAPS
ORAL_CAPSULE | ORAL | Status: AC
  Filled 2017-10-26: qty 1

## 2017-10-26 MED ORDER — BUPIVACAINE-EPINEPHRINE (PF) 0.5% -1:200000 IJ SOLN
INTRAMUSCULAR | Status: DC | PRN
  Administered 2017-10-26: 30 mL via PERINEURAL
  Administered 2017-10-26: 30 mL

## 2017-10-26 MED ORDER — TECHNETIUM TC 99M SULFUR COLLOID FILTERED
1.0000 | Freq: Once | INTRAVENOUS | Status: AC | PRN
  Administered 2017-10-26: 1 via INTRADERMAL

## 2017-10-26 MED ORDER — METHYLENE BLUE 0.5 % INJ SOLN
INTRAVENOUS | Status: AC
  Filled 2017-10-26: qty 10

## 2017-10-26 MED ORDER — CIPROFLOXACIN IN D5W 400 MG/200ML IV SOLN
INTRAVENOUS | Status: AC
  Filled 2017-10-26: qty 200

## 2017-10-26 SURGICAL SUPPLY — 54 items
ADH SKN CLS APL DERMABOND .7 (GAUZE/BANDAGES/DRESSINGS) ×1
BINDER BREAST LRG (GAUZE/BANDAGES/DRESSINGS) IMPLANT
BINDER BREAST MEDIUM (GAUZE/BANDAGES/DRESSINGS) IMPLANT
BINDER BREAST XLRG (GAUZE/BANDAGES/DRESSINGS) IMPLANT
BINDER BREAST XXLRG (GAUZE/BANDAGES/DRESSINGS) IMPLANT
BLADE SURG 15 STRL LF DISP TIS (BLADE) ×1 IMPLANT
BLADE SURG 15 STRL SS (BLADE) ×3
CANISTER SUC SOCK COL 7IN (MISCELLANEOUS) IMPLANT
CANISTER SUCT 1200ML W/VALVE (MISCELLANEOUS) IMPLANT
CHLORAPREP W/TINT 26ML (MISCELLANEOUS) ×3 IMPLANT
CLIP VESOCCLUDE MED 6/CT (CLIP) ×2 IMPLANT
CLIP VESOCCLUDE SM WIDE 6/CT (CLIP) ×2 IMPLANT
COVER BACK TABLE 60X90IN (DRAPES) ×3 IMPLANT
COVER MAYO STAND STRL (DRAPES) ×3 IMPLANT
COVER PROBE W GEL 5X96 (DRAPES) ×3 IMPLANT
DECANTER SPIKE VIAL GLASS SM (MISCELLANEOUS) IMPLANT
DERMABOND ADVANCED (GAUZE/BANDAGES/DRESSINGS) ×2
DERMABOND ADVANCED .7 DNX12 (GAUZE/BANDAGES/DRESSINGS) ×1 IMPLANT
DEVICE DUBIN W/COMP PLATE 8390 (MISCELLANEOUS) ×3 IMPLANT
DRAPE LAPAROSCOPIC ABDOMINAL (DRAPES) ×3 IMPLANT
DRAPE UTILITY XL STRL (DRAPES) ×3 IMPLANT
ELECT COATED BLADE 2.86 ST (ELECTRODE) ×3 IMPLANT
ELECT REM PT RETURN 9FT ADLT (ELECTROSURGICAL) ×3
ELECTRODE REM PT RTRN 9FT ADLT (ELECTROSURGICAL) ×1 IMPLANT
GLOVE BIO SURGEON STRL SZ 6.5 (GLOVE) ×1 IMPLANT
GLOVE BIO SURGEONS STRL SZ 6.5 (GLOVE) ×1
GLOVE BIOGEL PI IND STRL 7.0 (GLOVE) IMPLANT
GLOVE BIOGEL PI IND STRL 8 (GLOVE) ×1 IMPLANT
GLOVE BIOGEL PI INDICATOR 7.0 (GLOVE) ×4
GLOVE BIOGEL PI INDICATOR 8 (GLOVE) ×2
GLOVE ECLIPSE 7.5 STRL STRAW (GLOVE) ×3 IMPLANT
GOWN STRL REUS W/ TWL LRG LVL3 (GOWN DISPOSABLE) ×1 IMPLANT
GOWN STRL REUS W/ TWL XL LVL3 (GOWN DISPOSABLE) ×1 IMPLANT
GOWN STRL REUS W/TWL LRG LVL3 (GOWN DISPOSABLE) ×3
GOWN STRL REUS W/TWL XL LVL3 (GOWN DISPOSABLE) ×3
ILLUMINATOR WAVEGUIDE N/F (MISCELLANEOUS) IMPLANT
KIT MARKER MARGIN INK (KITS) ×3 IMPLANT
NDL HYPO 25X1 1.5 SAFETY (NEEDLE) ×2 IMPLANT
NDL SAFETY ECLIPSE 18X1.5 (NEEDLE) ×1 IMPLANT
NEEDLE HYPO 18GX1.5 SHARP (NEEDLE) ×3
NEEDLE HYPO 25X1 1.5 SAFETY (NEEDLE) ×6 IMPLANT
NS IRRIG 1000ML POUR BTL (IV SOLUTION) IMPLANT
PACK BASIN DAY SURGERY FS (CUSTOM PROCEDURE TRAY) ×3 IMPLANT
PENCIL BUTTON HOLSTER BLD 10FT (ELECTRODE) ×3 IMPLANT
SLEEVE SCD COMPRESS KNEE MED (MISCELLANEOUS) ×3 IMPLANT
SPONGE LAP 4X18 RFD (DISPOSABLE) ×3 IMPLANT
SUT MON AB 5-0 PS2 18 (SUTURE) ×3 IMPLANT
SUT VICRYL 3-0 CR8 SH (SUTURE) ×5 IMPLANT
SYR CONTROL 10ML LL (SYRINGE) ×6 IMPLANT
TOWEL GREEN STERILE FF (TOWEL DISPOSABLE) ×3 IMPLANT
TOWEL OR NON WOVEN STRL DISP B (DISPOSABLE) ×3 IMPLANT
TUBE CONNECTING 20'X1/4 (TUBING)
TUBE CONNECTING 20X1/4 (TUBING) IMPLANT
YANKAUER SUCT BULB TIP NO VENT (SUCTIONS) IMPLANT

## 2017-10-26 NOTE — Anesthesia Procedure Notes (Signed)
Procedure Name: LMA Insertion Date/Time: 10/26/2017 10:48 AM Performed by: Marrianne Mood, CRNA Pre-anesthesia Checklist: Patient identified, Emergency Drugs available, Suction available, Patient being monitored and Timeout performed Patient Re-evaluated:Patient Re-evaluated prior to induction Oxygen Delivery Method: Circle system utilized Preoxygenation: Pre-oxygenation with 100% oxygen Induction Type: IV induction Ventilation: Mask ventilation without difficulty LMA: LMA inserted LMA Size: 4.0 Number of attempts: 1 Airway Equipment and Method: Bite block Placement Confirmation: positive ETCO2 Tube secured with: Tape Dental Injury: Teeth and Oropharynx as per pre-operative assessment

## 2017-10-26 NOTE — Discharge Instructions (Signed)
Post Anesthesia Home Care Instructions  Activity: Get plenty of rest for the remainder of the day. A responsible individual must stay with you for 24 hours following the procedure.  For the next 24 hours, DO NOT: -Drive a car -Paediatric nurse -Drink alcoholic beverages -Take any medication unless instructed by your physician -Make any legal decisions or sign important papers.  Meals: Start with liquid foods such as gelatin or soup. Progress to regular foods as tolerated. Avoid greasy, spicy, heavy foods. If nausea and/or vomiting occur, drink only clear liquids until the nausea and/or vomiting subsides. Call your physician if vomiting continues.  Special Instructions/Symptoms: Your throat may feel dry or sore from the anesthesia or the breathing tube placed in your throat during surgery. If this causes discomfort, gargle with warm salt water. The discomfort should disappear within 24 hours.  If you had a scopolamine patch placed behind your ear for the management of post- operative nausea and/or vomiting:  1. The medication in the patch is effective for 72 hours, after which it should be removed.  Wrap patch in a tissue and discard in the trash. Wash hands thoroughly with soap and water. 2. You may remove the patch earlier than 72 hours if you experience unpleasant side effects which may include dry mouth, dizziness or visual disturbances. 3. Avoid touching the patch. Wash your hands with soap and water after contact with the patch.    Call your surgeon if you experience:   1.  Fever over 101.0. 2.  Inability to urinate. 3.  Nausea and/or vomiting. 4.  Extreme swelling or bruising at the surgical site. 5.  Continued bleeding from the incision. 6.  Increased pain, redness or drainage from the incision. 7.  Problems related to your pain medication. 8.  Any problems and/or concerns  [employer] Phone Number [phone]  BREAST BIOPSY/ PARTIAL MASTECTOMY:  POST OP INSTRUCTIONS  Always review your discharge instruction sheet given to you by the facility where your surgery was performed.  IF YOU HAVE DISABILITY OR FAMILY LEAVE FORMS, YOU MUST BRING THEM TO THE OFFICE FOR PROCESSING.  DO NOT GIVE THEM TO YOUR DOCTOR.  1. A prescription for pain medication may be given to you upon discharge.  Take your pain medication as prescribed, if needed.  If narcotic pain medicine is not needed, then you may take acetaminophen (Tylenol) or ibuprofen (Advil) as needed. 2. Take your usually prescribed medications unless otherwise directed 3. If you need a refill on your pain medication, please contact your pharmacy.  They will contact our office to request authorization.  Prescriptions will not be filled after 5pm or on week-ends. 4. You should eat very light the first 24 hours after surgery, such as soup, crackers, pudding, etc.  Resume your normal diet the day after surgery. 5. Most patients will experience some swelling and bruising in the breast.  Ice packs and a good support bra will help.  Swelling and bruising can take several days to resolve.  6. It is common to experience some constipation if taking pain medication after surgery.  Increasing fluid intake and taking a stool softener will usually help or prevent this problem from occurring.  A mild laxative (Milk of Magnesia or Miralax) should be taken according to package directions if there are no bowel movements after 48 hours. 7. Unless discharge instructions indicate otherwise, you may remove your bandages 24-48 hours after surgery, and you may shower at that time.  You may have steri-strips (small  skin tapes) in place directly over the incision.  These strips should be left on the skin for 7-10 days.  If your surgeon used skin glue on the incision, you may shower in 24 hours.  The glue will flake off over the next 2-3 weeks.  Any sutures or staples will be removed at the office during your follow-up  visit. 8. ACTIVITIES:  You may resume regular daily activities (gradually increasing) beginning the next day.  Wearing a good support bra or sports bra minimizes pain and swelling.  You may have sexual intercourse when it is comfortable. a. You may drive when you no longer are taking prescription pain medication, you can comfortably wear a seatbelt, and you can safely maneuver your car and apply brakes. b. RETURN TO WORK:  ______________________________________________________________________________________ 9. You should see your doctor in the office for a follow-up appointment approximately two weeks after your surgery.  Your doctors nurse will typically make your follow-up appointment when she calls you with your pathology report.  Expect your pathology report 2-3 business days after your surgery.  You may call to check if you do not hear from [name] after three days. 10. OTHER INSTRUCTIONS: _______________________________________________________________________________________________ _____________________________________________________________________________________________________________________________________ _____________________________________________________________________________________________________________________________________ _____________________________________________________________________________________________________________________________________  WHEN TO CALL YOUR DOCTOR: 1. Fever over 101.0 2. Nausea and/or vomiting. 3. Extreme swelling or bruising. 4. Continued bleeding from incision. 5. Increased pain, redness, or drainage from the incision.  The clinic staff is available to answer your questions during regular business hours.  Please dont hesitate to call and ask to speak to one of the nurses for clinical concerns.  If you have a medical emergency, go to the nearest emergency room or call 911.  A surgeon from [facility] is always on call at the  hospital.  For further questions, please visit [email or web address]

## 2017-10-26 NOTE — Anesthesia Procedure Notes (Addendum)
Anesthesia Regional Block: Pectoralis block   Pre-Anesthetic Checklist: ,, timeout performed, Correct Patient, Correct Site, Correct Laterality, Correct Procedure, Correct Position, site marked, Risks and benefits discussed,  Surgical consent,  Pre-op evaluation,  At surgeon's request and post-op pain management  Laterality: Right  Prep: chloraprep       Needles:   Needle Type: Stimiplex     Needle Length: 9cm      Additional Needles:   Procedures:,,,, ultrasound used (permanent image in chart),,,,  Narrative:  Start time: 10/26/2017 10:24 AM End time: 10/26/2017 10:28 AM Injection made incrementally with aspirations every 5 mL.  Performed by: Personally  Anesthesiologist: Nolon Nations, MD  Additional Notes: Patient tolerated well. Good fascial spread noted.

## 2017-10-26 NOTE — Anesthesia Preprocedure Evaluation (Addendum)
Anesthesia Evaluation  Patient identified by MRN, date of birth, ID band Patient awake    Reviewed: Allergy & Precautions, H&P , NPO status , Patient's Chart, lab work & pertinent test results, reviewed documented beta blocker date and time   Airway Mallampati: II  TM Distance: >3 FB Neck ROM: Full    Dental  (+) Partial Upper, Dental Advisory Given, Teeth Intact   Pulmonary former smoker,    breath sounds clear to auscultation       Cardiovascular hypertension, Pt. on home beta blockers  Rhythm:Regular     Neuro/Psych    GI/Hepatic   Endo/Other  diabetes, Well Controlled, Type 2, Oral Hypoglycemic Agents  Renal/GU Renal InsufficiencyRenal disease     Musculoskeletal   Abdominal   Peds  Hematology   Anesthesia Other Findings   Reproductive/Obstetrics                             Anesthesia Physical  Anesthesia Plan  ASA: III  Anesthesia Plan: General   Post-op Pain Management: GA combined w/ Regional for post-op pain   Induction: Intravenous  PONV Risk Score and Plan: 3 and Ondansetron, Dexamethasone and Treatment may vary due to age or medical condition  Airway Management Planned: LMA  Additional Equipment:   Intra-op Plan:   Post-operative Plan: Extubation in OR  Informed Consent: I have reviewed the patients History and Physical, chart, labs and discussed the procedure including the risks, benefits and alternatives for the proposed anesthesia with the patient or authorized representative who has indicated his/her understanding and acceptance.   Dental advisory given  Plan Discussed with: CRNA  Anesthesia Plan Comments:         Anesthesia Quick Evaluation

## 2017-10-26 NOTE — Transfer of Care (Signed)
Immediate Anesthesia Transfer of Care Note  Patient: Adrienne [name]  Procedure(s) Performed: BREAST LUMPECTOMY WITH RADIOACTIVE SEED AND SENTINEL LYMPH NODE BIOPSY (Right Breast)  Patient Location: PACU  Anesthesia Type:GA combined with regional for post-op pain  Level of Consciousness: awake and patient cooperative  Airway & Oxygen Therapy: Patient Spontanous Breathing and Patient connected to face mask oxygen  Post-op Assessment: Report given to RN and Post -op Vital signs reviewed and stable  Post vital signs: Reviewed and stable  Last Vitals:  Vitals Value Taken Time  BP    Temp    Pulse 71 10/26/2017 12:19 PM  Resp 12 10/26/2017 12:19 PM  SpO2 100 % 10/26/2017 12:19 PM  Vitals shown include unvalidated device data.  Last Pain:  Vitals:   10/26/17 0935  TempSrc: Oral  PainSc: 0-No pain         Complications: No apparent anesthesia complications

## 2017-10-26 NOTE — Interval H&P Note (Signed)
History and Physical Interval Note:  10/26/2017 10:26 AM  Adrienne [name]  has presented today for surgery, with the diagnosis of RIGHT BREAST CANCER  The various methods of treatment have been discussed with the patient and family. After consideration of risks, benefits and other options for treatment, the patient has consented to  Procedure(s): BREAST LUMPECTOMY WITH RADIOACTIVE SEED AND SENTINEL LYMPH NODE BIOPSY (Right) as a surgical intervention .  The patient's history has been reviewed, patient examined, no change in status, stable for surgery.  I have reviewed the patient's chart and labs.  Questions were answered to the patient's satisfaction.     Darene Lamer [name]

## 2017-10-26 NOTE — H&P (Signed)
History of Present Illness Marland Kitchen T. Daissy Yerian MD; 09/30/2017 10:38 AM) The patient is a 83 year old female who presents with breast cancer. She is a postmenopausal female who returns for evaluation of recently diagnosed carcinoma of the right breast. She recently presented for a screening mamogram revealing possible abnormalities in each breast. Subsequent imaging included diagnostic mamogram showing a new 0.9 cm oval indistinct mass in the right posterior breast superiorly as well as an area of focal asymmetry with calcifications in the left breast and ultrasound showing a 1 cm oval mass with angular margins right breast 9:30 position posterior depth of 9 cm from the nipple corresponding to the mammographic abnormality as well as a 0.8 cm rounded mass in the left breast. Ultrasound-guided core biopsy was performed of both of these areas showing benign findings on the left but on the right and invasive cancer associated with DCIS.Marland Kitchen   Patient has a Hx of left breast lumpectomy and negative sentinel lymph node biopsy, radiation therapy and now on adjuvant Aromasin for stage IA invasive ductal carcinoma, ER positive. Primary tumor was 8 mm with associated DCIS that was close, 1 mm to one margin focally. Surgery date of May 2014. She completed her radiation therapy without difficulty. She denies any changes in her breasts specifically skin changes or lump or nipple discharge or pain.  Findings at that time were the following: Tumor size: 1 cm Tumor grade: 3 Estrogen Receptor: Negative Progesterone Receptor: Negative Her-2 neu: Negative Lymph node status: Negative      Problem List/Past Medical Marland Kitchen T. Salah Burlison, MD; 09/30/2017 10:51 AM) MALIGNANT NEOPLASM OF LEFT BREAST, STAGE 1 (W09.811)   Past Surgical History Marland Kitchen T. Addi Pak, MD; 09/30/2017 10:51 AM) Appendectomy  Breast Biopsy  Left. Breast Mass; Local Excision  Left. Gallbladder Surgery - Open   Diagnostic Studies  History Marland Kitchen T. Yunuen Mordan, MD; 09/30/2017 10:51 AM) Colonoscopy  1-5 years ago Mammogram  within last year Pap Smear  1-5 years ago  Allergies Sabino Gasser; 09/30/2017 10:04 AM) Penicillamine *ASSORTED CLASSES*  RadiaPlex *DERMATOLOGICALS*  Allergies Reconciled   Medication History Sabino Gasser; 09/30/2017 10:04 AM) Eliquis (5MG Tablet, Oral) Active. Multiple Vitamin (Oral) Active. Claritin (10MG Tablet, Oral) Active. Fish Oil (1000MG Capsule DR, Oral) Active. Diltiazem HCl (180MG/24HR Capsule ER, Oral) Active. Allopurinol (300MG Tablet, Oral) Active. Accu-Chek Aviva Plus (In Vitro) Active. Exemestane (25MG Tablet, Oral) Active. MetFORMIN HCl (500MG Tablet, Oral) Active. Atenolol (25MG Tablet, Oral) Active. Medications Reconciled  Social History Marland Kitchen T. Manna Gose, MD; 09/30/2017 10:51 AM) Alcohol use  Occasional alcohol use. Caffeine use  Coffee. No drug use  Tobacco use  Former smoker.  Family History Marland Kitchen T. Annaliese Saez, MD; 09/30/2017 10:51 AM) Hypertension  Father, Mother.  Pregnancy / Birth History Marland Kitchen T. Westley Blass, MD; 09/30/2017 10:51 AM) Age at menarche  31 years. Age of menopause  35-55 Gravida  3 Maternal age  28-25 Para  3  Other Problems Marland Kitchen T. Candi Profit, MD; 09/30/2017 10:51 AM) Breast Cancer  Diabetes Mellitus  High blood pressure  Hypercholesterolemia   Vitals Sabino Gasser; 09/30/2017 10:05 AM) 09/30/2017 10:05 AM Weight: 137.25 lb Height: 62in Body Surface Area: 1.63 m Body Mass Index: 25.1 kg/m  Temp.: 98.61F(Oral)  Pulse: 84 (Regular)  BP: 124/68 (Sitting, Left Arm, Standard)       Physical Exam Marland Kitchen T. Dmonte Maher MD; 09/30/2017 10:45 AM) The physical exam findings are as follows: Note:General: Alert, elderly Caucasian female, in no distress Skin: Warm and dry without rash or infection. HEENT: No palpable masses or  thyromegaly. Sclera nonicteric. Pupils equal round and  reactive. Lymph nodes: No cervical, supraclavicular, nodes palpable. Breasts: Some bruising lateral right breast without palpable masses. Left breast shows some mild thickening of the lumpectomy site otherwise negative. No palpable axillary adenopathy. Lungs: Breath sounds clear and equal. No wheezing or increased work of breathing. Cardiovascular: Regular rate and rhythm without murmer. No JVD or edema. Extremities: No edema or joint swelling or deformity. No chronic venous stasis changes. Neurologic: Alert and fully oriented. Gait normal. No focal weakness. Psychiatric: Normal mood and affect. Thought content appropriate with normal judgement and insight    Assessment & Plan Marland Kitchen T. Mistina Coatney MD; 09/30/2017 10:51 AM) MALIGNANT NEOPLASM OF RIGHT BREAST, STAGE 1, ESTROGEN RECEPTOR NEGATIVE (C50.911) Impression: 83 year old female with a new diagnosis of cancer of the right breast, upper outer quadrant. Clinical stage 1A, triple negative. Previous history of left breast cancer as above with no evidence of recurrence.. I discussed with the patient today initial surgical treatment options. We discussed options of breast conservation with lumpectomy or total mastectomy and sentinal lymph node biopsy/dissection. I feel this cancer could be very well treated with breast conservation with lumpectomy and sentinel lymph node biopsy and she is in agreement. She might consider mastectomy if she were to medically positive and genetics is indicated. We will schedule her for genetic testing and schedule her surgery far enough out that this result will be back.. We discussed the indications and nature of the procedure, and expected recovery, in detail. Surgical risks including anesthetic complications, cardiorespiratory complications, bleeding, infection, wound healing complications, blood clots, lymphedema, local and distant recurrence and possible need for further surgery based on the final pathology was  discussed and understood. Chemotherapy, hormonal therapy and radiation therapy have been discussed. She is of course familiar with these issues. Current Plans Referred to Genetic Counseling, for evaluation and follow up (Medical Genetics). Routine. Referred to Oncology, for evaluation and follow up (Oncology). Routine. Referred to Radiation Oncology, for evaluation and follow up (Radiation Oncology). Routine. Radioactive seed localized right breast lumpectomy and axillary sentinel lymph node biopsy under general anesthesia as an outpatient.

## 2017-10-26 NOTE — Op Note (Signed)
Preoperative Diagnosis: RIGHT BREAST CANCER  Postoprative Diagnosis: RIGHT BREAST CANCER  Procedure: Procedure(s): BREAST LUMPECTOMY WITH RADIOACTIVE SEED AND SENTINEL LYMPH NODE BIOPSY   Surgeon: Excell Seltzer T   Assistants: None  Anesthesia:  General LMA anesthesia  Indications: 83 year old female with a new diagnosis of cancer of the right breast, upper outer quadrant. Clinical stage 1A, triple negative.  After preoperative work-up and discussion detailed elsewhere we have elected to proceed with radioactive seed localized right breast lumpectomy and axillary sentinel lymph node biopsy as initial surgical therapy.   Procedure Detail: Patient had previously undergone accurate placement of a radioactive seed at the tumor and marking clip site in the upper outer right breast.  In the holding area she underwent injection of 1 mCi of technetium sulfur colloid intradermally around the right nipple.  Seed placement was confirmed in the holding area with the neoprobe.  She was taken to the operating room, placed in the supine position on the operating table, and laryngeal mask general anesthesia induced.  She was carefully positioned with her right arm extended and the entire right breast and axilla and upper arm were widely sterilely prepped and draped.  She received preoperative IV antibiotics.  Patient timeout was performed and correct procedure verified.  The lumpectomy was approached initially.  The seed was localized fairly far laterally in the upper outer breast.  I made a curvilinear incision in a skin crease directly over the area of high counts and dissection was carried down into the subcutaneous tissue.  The seed appeared to be superficial and skin and subcutaneous flaps were raised for a short distance.  Using the neoprobe for guidance and also being able to feel a small mass centrally in the specimen and approximately 2 to 2-1/2 cm globular specimen of breast tissue was excised with  the cautery.  Ex vivo the seed was confirmed in the specimen and seemed to be somewhat superficial.  The specimen was inked for margins and specimen x-ray showed the seton marking clip and tumor centrally located within the specimen.  This was a fairly flat area of breast tissue and with the seed being superficial I did take additional superficial margin.  The end inferior portion of this was oriented with ink in the superior portion of the additional superficial margin was piecemeal and sent and oriented.  Also excised several millimeters of further deep margin which was oriented and sent as a separate specimen.  Complete hemostasis was assured.  Soft tissue was infiltrated with Marcaine.  The lumpectomy cavity was marked with clips.  Deep breast and subtenons tissue was closed with interrupted 3-0 Vicryl.  Attention was turned to the sentinel lymph node biopsy a hot area in the right axilla was localized with the neoprobe and a small transverse incision made.  Dissection was carried down through the subcutaneous tissue with cautery and the clavipectoral fascia was opened.  Using the neoprobe for guidance I bluntly dissected down onto a soft slightly enlarged lymph node with elevated counts.  This was completely excised with cautery and ex vivo had counts of above 300.  Background in the axilla at this point was essentially 0 and there was no palpable adenopathy.  This was sent as right axillary sentinel lymph node.  Hemostasis was assured and the soft tissue was infiltrated with Marcaine.  Deep tissue was closed with interrupted 3-0 Vicryl as well as subcu in both skin incisions closed with subcuticular 5-0 Monocryl and Dermabond.  Sponge needle and instrument  counts were correct.    Findings: As above  Estimated Blood Loss:  Minimal         Drains: None  Blood Given: none          Specimens: #1 right breast lumpectomy   #2 additional superficial margin oriented #3 additional superficial margin  unoriented   #4 additional deep margin   #5 right axillary sentinel lymph node        Complications:  * No complications entered in OR log *         Disposition: PACU - hemodynamically stable.         Condition: stable

## 2017-10-26 NOTE — Progress Notes (Signed)
Assisted Dr. Germeroth with right, ultrasound guided, pectoralis block. Side rails up, monitors on throughout procedure. See vital signs in flow sheet. Tolerated Procedure well. 

## 2017-10-27 ENCOUNTER — Encounter (HOSPITAL_BASED_OUTPATIENT_CLINIC_OR_DEPARTMENT_OTHER): Payer: Self-pay | Admitting: General Surgery

## 2017-10-27 NOTE — Anesthesia Postprocedure Evaluation (Signed)
Anesthesia Post Note  Patient: Adrienne [name]  Procedure(s) Performed: BREAST LUMPECTOMY WITH RADIOACTIVE SEED AND SENTINEL LYMPH NODE BIOPSY (Right Breast)     Patient location during evaluation: PACU Anesthesia Type: General Level of consciousness: sedated and patient cooperative Pain management: pain level controlled Vital Signs Assessment: post-procedure vital signs reviewed and stable Respiratory status: spontaneous breathing Cardiovascular status: stable Anesthetic complications: no    Last Vitals:  Vitals:   10/26/17 1300 10/26/17 1336  BP: 121/77 137/90  Pulse: 67 69  Resp: 12 18  Temp:  36.6 C  SpO2: 94% 94%    Last Pain:  Vitals:   10/27/17 1048  TempSrc:   PainSc: 0-No pain   Pain Goal:                 Nolon Nations

## 2017-11-10 ENCOUNTER — Other Ambulatory Visit: Payer: Self-pay | Admitting: Hematology

## 2017-11-11 NOTE — Progress Notes (Signed)
[facility] Follow-up office visit  Date of Service:  11/12/2017   ID: Adrienne [name] OB: 09/24/1934  MR#: [id number]  VAP#:[id number]  PCP: [name], NP      CHIEF COMPLAINT: Follow up for right breast cancer, triple negative   Oncology History   Cancer Staging Breast cancer of upper-outer quadrant of right female breast [facility]) Staging form: Breast, AJCC 8th Edition - Clinical stage from 09/22/2017: Stage IB (cT1b, cN0, cM0, G2, ER-, PR-, HER2-) - Signed by Truitt Merle, MD on 10/08/2017  Breast cancer, left breast [facility]) Staging form: Breast, AJCC 7th Edition - Clinical stage from 09/12/2012: Stage IA (T1b, N0, M0) - Unsigned        Breast cancer, left breast ([place])   08/25/2012 Receptors her2    ER 100% positive, PR 88% positive, HER-2 negative, Ki-67 16%      08/30/2012 Initial Diagnosis    Breast cancer, left breast      09/12/2012 Pathology Results    T1bN0 Grade 1 invasive ductal carcinoma, and DCIS.      09/12/2012 Surgery    Left breast lumpectomy and sentinel lymph node biopsy, negative margins.      11/09/2012 - 12/13/2012 Radiation Therapy    Adjuvant breast radiation      12/2012 -  Anti-estrogen oral therapy    Anastrozole 1 mg daily, switched to Aromasin 25 mg daily in Jan 2015 due to diarrhea. Her Exemestane was switched to letrozole in 08/2017 due to high copay.      09/01/2016 Imaging    [name] Outside films Breast form [facility] 09/01/2016 IMPRESSION: Probably Benign 1. No significant change in oval hypoechoic masses in the left breast at 11:00 2 cm from the nipple and 10:30 4 cm from the nipple. Both of these masses resemble the area of fat necrosis previously biopsies at 2:00. In addition, both masses have developing central benign or dystrophic appearing calcifications and are favored to be other areas of fat necrosis. A 6 month follow-up left breast mammogram and ultrasound is recommended.      09/01/2016 Mammogram    [facility] Mammogram from [facility]  09/01/16 IMPRESSION  Incomplete - additional imaging evaluation needed Ultrasound of the upper medial left breast mass is recommended.       03/09/2017 Mammogram    Previous lumpectomy changes in the upper outer left breast anterior depth with stable associated dystrophic calcifications.  Biopsy clip at the 2:00 in the left breast near the lumpectomy site corresponding to previously biopsied benign fat necrosis.  Persistent round mass in the upper medial left breast with benign-appearing central round calcification.  No significant masses, calcifications, or other findings are seen in the breast  Impression: ultrasound is recommended for the left breast      03/09/2017 Breast US    Left breast ultrasound impression:  No significant change in oval hypoechoic masses in the left breast at 11:00 2 cm from the nipple and 10:30 4 cm from the nipple.  Both of these masses resemble the area of fat necrosis previously biopsied at 2:00.  In addition, both masses have developing central benign or dystrophic appearing calcifications and are favored to be other areas of fat necrosis.  A 81-monthfollow-up bilateral mammogram and left breast ultrasound is recommended.      09/14/2017 Breast [place]   Breast [place] 09/14/17 at [facility]  IMPRESSION:  The 1 cm oval mass in the left breast at 9:30 posterior depth is highly suggestive of malignancy. An  Ultrasound guided biopsy is recommended.  The 0.8 cm round mass in the left breast at 11-12 o'clock anterior depth is suspicious of malignancy. An ultrasound guided biopsy is recommended.       09/22/2017 Pathology Results    Diagnosis 1. Breast, right, needle core biopsy - INVASIVE DUCTAL CARCINOMA, MSBR GRADE II/III. - DUCTAL CARCINOMA IN SITU WITH NECROSIS. 2. Breast, left, needle core biopsy - FAT NECROSIS. - NO EVIDENCE OF MALIGNANCY.      10/17/2017 Genetic Testing    Negative genetic testing on the Multicancer panel.  The Multi-Gene Panel offered by  [facility] includes sequencing and/or deletion duplication testing of the following 83 genes: ALK, APC, ATM, AXIN2,BAP1,  BARD1, BLM, BMPR1A, BRCA1, BRCA2, BRIP1, CASR, CDC73, CDH1, CDK4, CDKN1B, CDKN1C, CDKN2A (p14ARF), CDKN2A (p16INK4a), CEBPA, CHEK2, CTNNA1, DICER1, DIS3L2, EGFR (c.2369C>T, p.Thr790Met variant only), EPCAM (Deletion/duplication testing only), FH, FLCN, GATA2, GPC3, GREM1 (Promoter region deletion/duplication testing only), HOXB13 (c.251G>A, p.Gly84Glu), HRAS, KIT, MAX, MEN1, MET, MITF (c.952G>A, p.Glu318Lys variant only), MLH1, MSH2, MSH3, MSH6, MUTYH, NBN, NF1, NF2, NTHL1, PALB2, PDGFRA, PHOX2B, PMS2, POLD1, POLE, POT1, PRKAR1A, PTCH1, PTEN, RAD50, RAD51C, RAD51D, RB1, RECQL4, RET, RUNX1, SDHAF2, SDHA (sequence changes only), SDHB, SDHC, SDHD, SMAD4, SMARCA4, SMARCB1, SMARCE1, STK11, SUFU, TERT, TERT, TMEM127, TP53, TSC1, TSC2, VHL, WRN and WT1.  The report date is October 17, 2017.       Breast cancer of upper-outer quadrant of right female breast ([place])   09/14/2017 Mammogram    Diagnostic mammogram at [facility]  IMPRESSION:  The new 0.9 cm oval high density mass in the right breast posterior depth superior region seen on the mediolateral oblique view only is indeterminate. The 1.1 cm focal asymmetry in the left breast central to the nipple anterior depth is indeterminate.       09/14/2017 Imaging    [name] Bilateral at [facility] IMPRESSION: The 1 cm oval mass in the right breast at 9:30 posterior depth is highly suggestive of malignancy. The 0.8 cm round mass in the left breast at 11-12 o'clock anterior depth is suspicious of malignancy.       09/22/2017 Cancer Staging    Staging form: Breast, AJCC 8th Edition - Clinical stage from 09/22/2017: Stage IB (cT1b, cN0, cM0, G2, ER-, PR-, HER2-) - Signed by Truitt Merle, MD on 10/08/2017      09/22/2017 Pathology Results    Bilateral needle core biopsy 1. Breast, right, needle core biopsy - INVASIVE DUCTAL CARCINOMA, MSBR GRADE II/III. - DUCTAL  CARCINOMA IN SITU WITH NECROSIS. 2. Breast, left, needle core biopsy - FAT NECROSIS. - NO EVIDENCE OF MALIGNANCY.      09/25/2017 Receptors her2    Estrogen Receptor: 0 Progesterone 0 HER2: Negative. Ki-67: 80%       10/08/2017 Initial Diagnosis    Breast cancer of upper-outer quadrant of right female breast ([place])      10/26/2017 Surgery    BREAST LUMPECTOMY WITH RADIOACTIVE SEED AND SENTINEL LYMPH NODE BIOPSY by Dr. Excell Seltzer  10/26/17      10/26/2017 Pathology Results    Diagnosis 10/26/17 1. Breast, lumpectomy, Right - INVASIVE DUCTAL CARCINOMA, GRADE 2, SPANNING 1 CM. - HIGH GRADE DUCTAL CARCINOMA IN SITU WITH NECROSIS. - FINAL RESECTION MARGINS (PARTS #2, 3, 4) ARE NEGATIVE. - BIOPSY SITE. - SEE ONCOLOGY TABLE. 2. Breast, excision, additional anterior margin- 2 pieces right - BENIGN BREAST TISSUE. 3. Breast, excision, Right unoriented anterior margin - BENIGN BREAST TISSUE. 4. Breast, excision, Right deep margin - BENIGN BREAST TISSUE. 5. Lymph node,  sentinel, biopsy, Right Axillary - ONE OF ONE LYMPH NODES NEGATIVE FOR CARCINOMA (0/1).      10/26/2017 Cancer Staging    Staging form: Breast, AJCC 8th Edition - Pathologic stage from 10/26/2017: Stage IB (pT1b, pN0, cM0, G2, ER-, PR-, HER2-) - Signed by Truitt Merle, MD on 11/12/2017        CURRENT THERAPY: Anastrozole switched to Aromasin in 2015 due to diarrhea. Aromasin was switched to Letrozole in 08/2017 due to high copay. Plan to stop letrozole end of 11/2017. -Adjuvant weekly Abraxane or Taxol for 12 weeks starting in 3 weeks     INTERVAL HISTORY:  Adrienne [name] returns for follow-up post 10/27/17 breast surgery. She presents to the clinic today by herself. She notes her surgery went well but has irritation of her upper right breast incision as it rubs against her clothes. She will see Dr. Excell Seltzer on 7/19. She still has adequate ROM. She is interested in chemo but will also discuss this with her family. She lives overall  independently and adequately. She is concerned about hair loss and is interested in a wig.   PAST MEDICAL HISTORY: Past Medical History:  Diagnosis Date  . Allergy   . Atrial fibrillation ([place])   . Breast cancer ([place]) 08/25/12   Invasive ductal ca,DCIS  . Diabetes mellitus    type II  . Family history of cancer   . Gout   . Hearing loss   . History of breast cancer   . History of frequent urinary tract infections   . Hx of radiation therapy 11/22/12- 12/19/12   breast 4250 cGy 17 sessions, left breast boost 750 cGy 3 sessions  . Hyperlipidemia   . Hypertension   . Osteopenia     PAST SURGICAL HISTORY: Past Surgical History:  Procedure Laterality Date  . ABDOMINAL HYSTERECTOMY Bilateral 1999   w/b/l salpingo-oopherectomy  . APPENDECTOMY    . BREAST LUMPECTOMY WITH NEEDLE LOCALIZATION AND AXILLARY SENTINEL LYMPH NODE BX Left 09/12/2012   Procedure: LEFT NEEDLE LOCALIZATION BREAST LUMPECTOMY TION AND AXILLARY SENTINEL LYMPH NODE BX;  Surgeon: Edward Jolly, MD;  Location: [facility];  Service: General;  Laterality: Left;  . BREAST LUMPECTOMY WITH RADIOACTIVE SEED AND SENTINEL LYMPH NODE BIOPSY Right 10/26/2017   Procedure: BREAST LUMPECTOMY WITH RADIOACTIVE SEED AND SENTINEL LYMPH NODE BIOPSY;  Surgeon: Excell Seltzer, MD;  Location: [place] [place];  Service: General;  Laterality: Right;  . BREAST SURGERY  1990's   right- fibroid cyst  . CHOLECYSTECTOMY    . SEPTOPLASTY    . TONSILLECTOMY      FAMILY HISTORY Family History  Problem Relation Age of Onset  . Hypertension Mother   . Heart disease Mother        Murmur and irregular heart disease  . Dementia Mother        d. 28  . Hypertension Father   . Aneurysm Father 55  . Breast cancer Cousin 69       mat first cousin    GYNECOLOGIC HISTORY: menarche at age 73, g68 p3, s/p TAH/BSO in early 42s.  Was on HRT for several years (cannot recall the duration).  No h/o abnormal pap smears or sexually transmitted  infections.    SOCIAL HISTORY: Lives in [place], [place] in a three story house, will move to [name] soon.  Her husband lives at a nursing home due to dementia.  She is a retired Pharmacist, hospital.  Her son and daughter live in [place].  ADVANCED DIRECTIVES: Not in place.    HEALTH MAINTENANCE: Social History   Tobacco Use  . Smoking status: Former Smoker    Packs/day: 0.25    Years: 4.00    Pack years: 1.00    Types: Cigarettes  . Smokeless tobacco: Never Used  . Tobacco comment: Cigarette use was 50 years ago  Substance Use Topics  . Alcohol use: Yes    Comment: Occ glass of wine with dinner  . Drug use: No    Mammogram: 09/14/2017  Colonoscopy: 2011 Bone Density Scan: 03/05/2015 osteopenia  Pap Smear: s/p tah/bso Eye Exam: due Vitamin D Level:  Normal, 04/14/13 Lipid Panel: unsure   Allergies  Allergen Reactions  . Penicillins Hives and Swelling  . Prednisone Other (See Comments)    ELEVATED BLOOD SUGAR  . Radiaplexrx [Woun'Dres Hydrogel Wound Dress] Hives    Current Outpatient Medications  Medication Sig Dispense Refill  . ACCU-CHEK AVIVA PLUS test strip USE TO TEST TWICE DAILY **ICD-10 CODE E13.9** 200 each 3  . allopurinol (ZYLOPRIM) 300 MG tablet TAKE 1 TABLET (300 MG TOTAL) BY MOUTH DAILY. **DNF 04/06/15** 90 tablet 3  . apixaban (ELIQUIS) 5 MG TABS tablet Take 1 tablet (5 mg total) by mouth 2 (two) times daily. 180 tablet 3  . chlorpheniramine (CHLOR-TRIMETON) 4 MG tablet Take 4 mg 2 (two) times daily as needed by mouth for allergies.    Marland Kitchen diltiazem (CARDIZEM CD) 360 MG 24 hr capsule TAKE 1 CAPSULE (360 MG TOTAL) BY MOUTH DAILY. 90 capsule 3  . fish oil-omega-3 fatty acids 1000 MG capsule Take 1 g by mouth daily.    Marland Kitchen letrozole (FEMARA) 2.5 MG tablet TAKE 1 TABLET BY MOUTH EVERY DAY 30 tablet 0  . metFORMIN (GLUCOPHAGE) 500 MG tablet Take 1 tablet (500 mg total) by mouth 2 (two) times daily. 180 tablet 3  . Multiple Vitamin (MULTIVITAMIN) tablet Take 1  tablet by mouth daily.    Marland Kitchen OVER THE COUNTER MEDICATION Take by mouth 2 (two) times daily. Presavision- for macular degeneration prevention    . traMADol (ULTRAM) 50 MG tablet Take 1 tablet (50 mg total) by mouth every 6 (six) hours as needed. 10 tablet 1  . atenolol (TENORMIN) 25 MG tablet TAKE 1 TABLET (25 MG TOTAL) BY MOUTH DAILY. 30 tablet 10  . VITAMIN D, ERGOCALCIFEROL, PO Take by mouth.     No current facility-administered medications for this visit.    REVIEW OF SYSTEMS:   Constitutional: Denies fevers, chills or abnormal night sweats Eyes: Denies blurriness of vision, double vision or watery eyes Ears, nose, mouth, throat, and face: Denies mucositis or sore throat Respiratory: Denies cough, dyspnea or wheezes Cardiovascular: Denies palpitation, chest discomfort or lower extremity swelling Gastrointestinal:  Denies nausea, heartburn or change in bowel habits Skin: Denies abnormal skin rashes Lymphatics: Denies new lymphadenopathy or easy bruising Neurological:Denies numbness, tingling or new weaknesses Behavioral/Psych: Mood is stable, no new changes  BREAST: (+) right axilla incision irritation All other systems were reviewed with the patient and are negative.  OBJECTIVE:  Vitals:   11/12/17 1039  BP: (!) 146/86  Pulse: 80  Resp: 18  Temp: 97.7 F (36.5 C)  SpO2: 98%     Body mass index is 26.24 kg/m.     GENERAL: Patient is a well appearing female in no acute distress HEENT:  Sclerae anicteric.  Oropharynx clear and moist. No ulcerations or evidence of oropharyngeal candidiasis. Neck is supple.  NODES:  No cervical, supraclavicular, or axillary  lymphadenopathy palpated.  BREAST EXAM: left breast s/p lumpectomy, a small mass underneath the recent biopsy site, with resolving skin ecchymosis. No other nodularity or masses, right breast no masses or nodules, benign bilateral breast exam. Bilateral axillary exam was negative for adenopathy. LUNGS:  Clear to auscultation  bilaterally.  No wheezes or rhonchi. HEART:  Regular rate and rhythm. No murmur appreciated. ABDOMEN:  Soft, nontender.  Positive, normoactive bowel sounds. No organomegaly palpated. MSK:  No focal spinal tenderness to palpation. Full range of motion bilaterally in the upper extremities. EXTREMITIES:  No peripheral edema.   SKIN:  Clear with no obvious rashes or skin changes. No nail dyscrasia. NEURO:  Nonfocal. Well oriented.  Appropriate affect. Breasts: Breast inspection showed them to be symmetrical with no nipple discharge. (+) S/p Right Breast Lumpectomy: Surgical incision in right breast and axilla healing very well, no discharge. Palpation of the breasts and axilla revealed no obvious mass that I could appreciate.    ECOG FS:1 - Symptomatic but completely ambulatory  LAB RESULTS: CBC Latest Ref Rng & Units 10/26/2017 09/20/2017 06/24/2017  WBC 3.9 - 10.3 K/uL - 7.3 7.4  Hemoglobin 12.0 - 15.0 g/dL 15.3(H) 14.7 15.0  Hematocrit 36.0 - 46.0 % 45.0 44.9 43.6  Platelets 145 - 400 K/uL - 180 210    CMP Latest Ref Rng & Units 10/26/2017 10/21/2017 09/20/2017  Glucose 70 - 99 mg/dL 182(H) 221(H) 142(H)  BUN 8 - 23 mg/dL 29(H) 25(H) 25  Creatinine 0.44 - 1.00 mg/dL 1.00 1.45(H) 1.08  Sodium 135 - 145 mmol/L 139 140 139  Potassium 3.5 - 5.1 mmol/L 4.0 4.4 4.1  Chloride 98 - 111 mmol/L 104 104 107  CO2 22 - 32 mmol/L - 27 24  Calcium 8.9 - 10.3 mg/dL - 9.9 9.7  Total Protein 6.4 - 8.3 g/dL - - 7.1  Total Bilirubin 0.2 - 1.2 mg/dL - - 0.5  Alkaline Phos 40 - 150 U/L - - 93  AST 5 - 34 U/L - - 20  ALT 0 - 55 U/L - - 35   PATHOLOGY REPORT   Diagnosis 10/26/17 1. Breast, lumpectomy, Right - INVASIVE DUCTAL CARCINOMA, GRADE 2, SPANNING 1 CM. - HIGH GRADE DUCTAL CARCINOMA IN SITU WITH NECROSIS. - FINAL RESECTION MARGINS (PARTS #2, 3, 4) ARE NEGATIVE. - BIOPSY SITE. - SEE ONCOLOGY TABLE. 2. Breast, excision, additional anterior margin- 2 pieces right - BENIGN BREAST TISSUE. 3. Breast,  excision, Right unoriented anterior margin - BENIGN BREAST TISSUE. 4. Breast, excision, Right deep margin - BENIGN BREAST TISSUE. 5. Lymph node, sentinel, biopsy, Right Axillary - ONE OF ONE LYMPH NODES NEGATIVE FOR CARCINOMA (0/1). Microscopic Comment 1. INVASIVE CARCINOMA OF THE BREAST: Resection Procedure: Right breast lumpectomy with additional anterior and posterior margin. Right axillary lymph node biopsy. Specimen Laterality: Right. Tumor Size: 1 cm. Histologic Type: Invasive ductal carcinoma. Histologic Grade: Glandular (Acinar)/Tubular Differentiation: 2 1 of 3 Amended copy Corrected FINAL for MARLOW, HENDRIE ([id number]) Microscopic Comment(continued) Nuclear Pleomorphism: 2 Mitotic Rate: 2 Overall Grade: 2 Ductal Carcinoma In Situ: Present, high grade with necrosis. Tumor Extension: Confined to breast parenchyma. Margins: Distance from closest margin (millimeters): Originally <1 mm anterior, 1 mm posterior. Final anterior and posterior margins (parts 2, 3, 4) are negative. Specify closest margin (required only if <58m): Final margins >10 mm. DCIS Margins Distance from closest margin (millimeters): Original 2-3 mm anterior, 1 mm posterior. Final anterior and posterior margins (parts 2, 3, 4) are negative. Specify closest margin (required only if <163m:  Final margins >10 mm Regional Lymph Nodes: Number of Lymph Nodes Examined: 1 Number of Sentinel Nodes Examined (if applicable): 1 Number of Lymph Nodes with Macrometastases (>2 mm): 0 Number of Lymph Nodes with Micrometastases: 0 Number of Lymph Nodes with Isolated Tumor Cells (0.2 mm or 200 cells)#: 0 Size of Largest Metastatic Deposit (millimeters): N/A. Extranodal Extension N/A. Treatment Effect: No known presurgical therapy Breast Biomarker Testing Performed on Previous Biopsy: Testing Performed on Case Number: [id number] Estrogen Receptor: 0 Progesterone 0 HER2: Negative. Ki-67: 80% Representative  tumor block: 1A-C. Pathologic Stage Classification (pTNM, AJCC 8th Edition): pT1b, pN0 (v4.2.0.0)   Diagnosis, 09/22/2017 1. Breast, right, needle core biopsy - INVASIVE DUCTAL CARCINOMA, MSBR GRADE II/III. - DUCTAL CARCINOMA IN SITU WITH NECROSIS. 2. Breast, left, needle core biopsy - FAT NECROSIS. - NO EVIDENCE OF MALIGNANCY.   Diagnosis  08/28/2015 Breast, left, needle core biopsy - FAT NECROSIS. - THERE IS NO EVIDENCE OF MALIGNANCY. - SEE COMMENT.         STUDIES: I have requested her recent mammogram report from [facility].  DEXA scan, 09/20/2017  Osteopenia, very high risk of fracture, 29% for major osteoporotic fracture and 18% for hip fracture.   Breast Diagnostic 09/14/17 at [facility]  IMPRESSION:  The new 0.9 cm oval high density mass in the right breast posterior depth superior region seen on the mediolateral oblique view only is indeterminate. The 1.1 cm focal asymmetry in the left breast central to the nipple anterior depth is indeterminate.   [name] Bilateral, 09/14/2017 at [facility] IMPRESSION: The 1 cm oval mass in the right breast at 9:30 posterior depth is highly suggestive of malignancy. The 0.8 cm round mass in the left breast at 11-12 o'clock anterior depth is suspicious of malignancy.    [name] Outside Films Breast 03/09/17  IMPRESSION: 1. No significant change in oval masses in the left breast at the 11:00 cm from the nipple and 10:30 4 cm from the nipple.  Both of these masses resemble the area of fat necrosis previously biopsied at 2:00. In addition, both of these masses have developing central benign or dystrophic appearing calcifications and are favored to be other areas of fat necrosis.  A 6 month follow up bilateral mammogram and left breast US is recommended.  [name] Outside films Breast form [facility] 09/01/2016 IMPRESSION: Probably Benign 1. No significant change in oval hypoechoic masses in the left breast at 11:00 2 cm from the nipple and 10:30 4 cm from the nipple.  Both of these masses resemble the area of fat necrosis previously biopsies at 2:00. In addition, both masses have developing central benign or dystrophic appearing calcifications and are favored to be other areas of fat necrosis. A 6 month follow-up left breast mammogram and ultrasound is recommended.  [facility] Mammogram from [facility] 09/01/16 IMPRESSION  Incomplete - additional imaging evaluation needed Ultrasound of the upper medial left breast mass is recommended.   Left breast mammogram and ultrasound 02/27/2016 Impression: probably benign The 4 mm oval mass in the left breast 12:00 position likely represent fat necrosis, the 3 mm mass in the left breast upper inner quadrant middle depth most likely is fat necrosis, the 4 mm oval cyst in the left breast upper inner quadrant posterior depth is benign. Post surgical scar in the left breast upper outer quadrant anterior depth is benign.  ASSESSMENT: 83 y.o. [place], [place] woman   1.  Breast cancer of upper outer quadrant of right breast, invasive ductal carcinoma, stage IB, pT1bN0M0, grade 2, Triple  Negative, Ki67: 80% -We previously discussed that her newly diagnosed right breast is different from her previous left breast cancer. It is triple negative and more aggressive. We discussed moderate to high risk of recurrence, and the role of adjuvant chemo. Due to her advanced age, she is not a candidate for intensive chemotherapy.  However she does have overall good health and performance status, she likely would be able to tolerate single agent Taxol or Abraxane if needed. -Her genetic testing was negative for 83 gene sequencing. -She underwent right breast lumpectomy with SLNB by Dr. Excell Seltzer on 10/26/17. I discussed her pathology which shows a 1cm tumor, G2, triple negative tumor was completely resection with negative margin, node negative. I discussed this is more aggressive than her previous cancer.  -I discussed there is a 20-30% risk of recurrence of  triple negative breast cancer. Standard of care would include adjuvant chemotherapy.  She is a 83 year old, she lives independently, no significant comorbidities, likely would be able to tolerate low intensity chemo. Given her age, I do not recommend intensive chemotherapy such as AC-T or TC. Her options include adjuvant weekly taxol or Abraxane for 12 cycles or CMF every 3 weeks for 6 cycles.  We also discussed surveillance alone without adjuvant chemotherapy.  After lengthy discussion she expressed her wishes to try chemotherapy, and she opted weekly Abraxane.  Due to her advanced age and diabetes, Abraxane would be a better option than Taxol.  ---Chemotherapy consent: Side effects including but does not limited to, fatigue, nausea, vomiting, diarrhea, hair loss, neuropathy, fluid retention, renal and kidney dysfunction, neutropenic fever, needed for blood transfusion, bleeding, were discussed with patient in great detail. She will discuss this with her children and family. If agreeable, plan to start in 3 weeks.  -I discussed radiation will reduce her risk of local recurrence. She is interested. Will proceed after chemotherapy.  -the goal of therapy is curative  -Given IV therapy at her age, I discussed the option of PAC placement, PICC line placement or proceeding with peripheral infusion. She will try peripheral access first.   -She is concerned about her hair loss with chemo. I will provide her a wig prescription to [facility] if needed.  -F/u in 3 weeks to start first cycle chemo  -chemo class in 1-2 weeks    2. pT1bN0, stage IA invasive ductal carcinoma of the left breast, grade I, ER 100%, PR 88%, Ki-67 16%, HER-2/neu negative. -she underwent lumpectomy with sentinel lymph node biopsy on 09/12/2012, s/p radiation therapy from 11/09/2012 through 12/19/2012.  - Her left breast biopsy in 08/2015 showed fat necrosis. No clinical evidence of recurrence. -Her repeated mammogram in May 2018 showed 2  lesions in UIQ of left breast, stable, likely benign (fat necrosis per biopsy in 2017) -Left breast mammogram and ultrasound on 03/09/2017 reveal hypoechoic masses that have developed central benign or dystrophic appearing calcifications and are favored to be areas of fat necrosis. -she is on adjuvant Aromasin, tolerating well -09/16/17 US showed mass behind her left breast scar and a mass In her right breast, both highly suggestive of malignancy.  -Her Exemestane was switched to letrozole in 08/2017 due to high copay. She is tolerating well, we'll continue, for total 5 years, until end of 11/2017    3. Osteopenia -She is on vitamin D supplement daily. -Giving her worsening hypercalcemia, I told her to stop calcium  -Her recent bone density scan from 03/05/2015 showed osteopenia, with 10 year probability of major ostial parotic fracture  25%, and hip fracture 15%.  -Given the high risk of fracture, she would benefit from bisphosphonate or Prolia injection. I previously discussed the benefit and potential side effects, and written material of Prolia was given. She has not done Prolia injection yet and she is hesitant to due to the concern of side effects. I will discuss the options of adjuvant Zometa after her chemotherapy.  -Patient most recent DEXA scan on 09/20/2017 showed: Osteopenia, very high risk of fracture, 29% for major osteoporotic fracture and 18% for hip fracture.    4. Hypercalcemia -She knows to drink adequately and avoid dehydration  -She was previously instructed to restart Vit D in 03/2017 as well as continue light weight bearing exercise.  -She'll follow up with primary care physician for further workup. -Calcium currently normalized  5. Diabetes and hyperglycemia -She noticed hyperglycemia since she started steroids for her left hip pain. I previously suggested she stop taking her steroid medication as she no longer needs it.  -I previously encouraged her to f/u with PCP to see if  she needs to change her metformin medication or dosage as her sugar has been higher lately.   6. Social Support -She lives at [facility] and is very independent -Her children live close by her    Plan -Lab, and weekly Abraxane 74m/m2 in 3, 4, 5 and 6 weeks  -chemo class in 1-2 weeks  -F/u with me or Lacie in 3, 4 and 6 weeks  -I will call her daughter to discuss this visit   I spent 20 minutes counseling the patient face to face.  The total time spent in the appointment was 25 minutes.  IOneal Deputy am acting as scribe for YTruitt Merle MD.   I have reviewed the above documentation for accuracy and completeness, and I agree with the above.    YTruitt Merle  11/12/2017

## 2017-11-12 ENCOUNTER — Inpatient Hospital Stay: Payer: Medicare Other | Attending: Hematology | Admitting: Hematology

## 2017-11-12 ENCOUNTER — Encounter: Payer: Self-pay | Admitting: Hematology

## 2017-11-12 ENCOUNTER — Telehealth: Payer: Self-pay | Admitting: Hematology

## 2017-11-12 VITALS — BP 146/86 | HR 80 | Temp 97.7°F | Resp 18 | Ht 61.0 in | Wt 138.9 lb

## 2017-11-12 DIAGNOSIS — M8580 Other specified disorders of bone density and structure, unspecified site: Secondary | ICD-10-CM

## 2017-11-12 DIAGNOSIS — Z170 Estrogen receptor positive status [ER+]: Secondary | ICD-10-CM | POA: Insufficient documentation

## 2017-11-12 DIAGNOSIS — E1165 Type 2 diabetes mellitus with hyperglycemia: Secondary | ICD-10-CM | POA: Diagnosis not present

## 2017-11-12 DIAGNOSIS — C50912 Malignant neoplasm of unspecified site of left female breast: Secondary | ICD-10-CM | POA: Diagnosis present

## 2017-11-12 DIAGNOSIS — Z87891 Personal history of nicotine dependence: Secondary | ICD-10-CM | POA: Diagnosis not present

## 2017-11-12 DIAGNOSIS — Z171 Estrogen receptor negative status [ER-]: Secondary | ICD-10-CM | POA: Insufficient documentation

## 2017-11-12 DIAGNOSIS — C50412 Malignant neoplasm of upper-outer quadrant of left female breast: Secondary | ICD-10-CM

## 2017-11-12 DIAGNOSIS — C50411 Malignant neoplasm of upper-outer quadrant of right female breast: Secondary | ICD-10-CM | POA: Diagnosis not present

## 2017-11-12 DIAGNOSIS — C50911 Malignant neoplasm of unspecified site of right female breast: Secondary | ICD-10-CM | POA: Diagnosis not present

## 2017-11-12 MED ORDER — ONDANSETRON HCL 8 MG PO TABS: 8.0000 mg | ORAL_TABLET | Freq: Two times a day (BID) | ORAL | 1 refills | Status: DC | PRN

## 2017-11-12 MED ORDER — PROCHLORPERAZINE MALEATE 10 MG PO TABS: 10.0000 mg | ORAL_TABLET | Freq: Four times a day (QID) | ORAL | 1 refills | Status: DC | PRN

## 2017-11-12 NOTE — Telephone Encounter (Signed)
Appointments scheduled AVS/Calendar printed per 7/12 los °

## 2017-11-12 NOTE — Progress Notes (Signed)
START OFF PATHWAY REGIMEN - Breast   [id number]:Nab-Paclitaxel (Abraxane(R)) 100 mg/m2 D1,8,15 q28 days:   A cycle is every 28 days (3 weeks on and 1 week off):     Nab-paclitaxel (protein bound)   **Always confirm dose/schedule in your pharmacy ordering system**  Patient Characteristics: Postoperative without Neoadjuvant Therapy (Pathologic Staging), Invasive Disease, Adjuvant Therapy, HER2 Negative/Unknown/Equivocal, ER Negative/Unknown, Node Negative, pT1a-c, pN0/N63m or pT2 or Higher, pN0 Therapeutic Status: Postoperative without Neoadjuvant Therapy (Pathologic Staging) AJCC Grade: G2 AJCC N Category: pN0 AJCC M Category: cM0 ER Status: Negative (-) AJCC 8 Stage Grouping: IB HER2 Status: Negative (-) Oncotype Dx Recurrence Score: Not Appropriate AJCC T Category: pT1b PR Status: Negative (-) Intent of Therapy: Curative Intent, Discussed with Patient

## 2017-11-15 ENCOUNTER — Telehealth: Payer: Self-pay | Admitting: Hematology

## 2017-11-15 NOTE — Telephone Encounter (Signed)
Per 7/15 sch msg.  Spoke with patient to schedule chemo education.  Mailed calendar.

## 2017-11-19 ENCOUNTER — Telehealth: Payer: Self-pay

## 2017-11-19 ENCOUNTER — Other Ambulatory Visit: Payer: Self-pay | Admitting: Hematology

## 2017-11-19 ENCOUNTER — Other Ambulatory Visit: Payer: Self-pay

## 2017-11-19 DIAGNOSIS — C50412 Malignant neoplasm of upper-outer quadrant of left female breast: Secondary | ICD-10-CM

## 2017-11-19 DIAGNOSIS — Z170 Estrogen receptor positive status [ER+]: Principal | ICD-10-CM

## 2017-11-19 NOTE — Telephone Encounter (Signed)
Left voice message for patient mailing prescription to her for wig.

## 2017-11-19 NOTE — Telephone Encounter (Signed)
Patient calls requesting Dr. Burr Medico write a prescription for a wig. She would like this mailed to her.

## 2017-11-29 ENCOUNTER — Inpatient Hospital Stay: Payer: Medicare Other

## 2017-11-29 ENCOUNTER — Telehealth: Payer: Self-pay | Admitting: Hematology

## 2017-11-29 NOTE — Telephone Encounter (Signed)
Left vm for pt re appt being changed per Regan Rakers

## 2017-12-01 NOTE — Progress Notes (Signed)
[facility] Follow-up office visit  Date of Service:  12/03/2017   ID: Adrienne [name] OB: [birth year]  MR#: [id number]  SEG#:[id number]  PCP: [name], NP      CHIEF COMPLAINT: Follow up for right breast cancer, triple negative   Oncology History   Cancer Staging Breast cancer of upper-outer quadrant of right female breast [facility]) Staging form: Breast, AJCC 8th Edition - Clinical stage from 09/22/2017: Stage IB (cT1b, cN0, cM0, G2, ER-, PR-, HER2-) - Signed by Truitt Merle, MD on 10/08/2017  Breast cancer, left breast [facility]) Staging form: Breast, AJCC 7th Edition - Clinical stage from 09/12/2012: Stage IA (T1b, N0, M0) - Unsigned        Breast cancer, left breast ([place])   08/25/2012 Receptors her2    ER 100% positive, PR 88% positive, HER-2 negative, Ki-67 16%      08/30/2012 Initial Diagnosis    Breast cancer, left breast      09/12/2012 Pathology Results    T1bN0 Grade 1 invasive ductal carcinoma, and DCIS.      09/12/2012 Surgery    Left breast lumpectomy and sentinel lymph node biopsy, negative margins.      11/09/2012 - 12/13/2012 Radiation Therapy    Adjuvant breast radiation      12/2012 - 11/2017 Anti-estrogen oral therapy    Anastrozole 1 mg daily, switched to Aromasin 25 mg daily in Jan 2015 due to diarrhea. Her Exemestane was switched to letrozole in 08/2017 due to high copay. She completed her 5 years in 11/2017.       09/01/2016 Imaging    [name] Outside films Breast form [facility] 09/01/2016 IMPRESSION: Probably Benign 1. No significant change in oval hypoechoic masses in the left breast at 11:00 2 cm from the nipple and 10:30 4 cm from the nipple. Both of these masses resemble the area of fat necrosis previously biopsies at 2:00. In addition, both masses have developing central benign or dystrophic appearing calcifications and are favored to be other areas of fat necrosis. A 6 month follow-up left breast mammogram and ultrasound is recommended.      09/01/2016  Mammogram    HM Mammogram from [facility] 09/01/16 IMPRESSION  Incomplete - additional imaging evaluation needed Ultrasound of the upper medial left breast mass is recommended.       03/09/2017 Mammogram    Previous lumpectomy changes in the upper outer left breast anterior depth with stable associated dystrophic calcifications.  Biopsy clip at the 2:00 in the left breast near the lumpectomy site corresponding to previously biopsied benign fat necrosis.  Persistent round mass in the upper medial left breast with benign-appearing central round calcification.  No significant masses, calcifications, or other findings are seen in the breast  Impression: ultrasound is recommended for the left breast      03/09/2017 Breast US    Left breast ultrasound impression:  No significant change in oval hypoechoic masses in the left breast at 11:00 2 cm from the nipple and 10:30 4 cm from the nipple.  Both of these masses resemble the area of fat necrosis previously biopsied at 2:00.  In addition, both masses have developing central benign or dystrophic appearing calcifications and are favored to be other areas of fat necrosis.  A 28-monthfollow-up bilateral mammogram and left breast ultrasound is recommended.      09/14/2017 Breast [place]   Breast [place] 09/14/17 at [facility]  IMPRESSION:  The 1 cm oval mass in the left breast at 9:30  posterior depth is highly suggestive of malignancy. An Ultrasound guided biopsy is recommended.  The 0.8 cm round mass in the left breast at 11-12 o'clock anterior depth is suspicious of malignancy. An ultrasound guided biopsy is recommended.       09/22/2017 Pathology Results    Diagnosis 1. Breast, right, needle core biopsy - INVASIVE DUCTAL CARCINOMA, MSBR GRADE II/III. - DUCTAL CARCINOMA IN SITU WITH NECROSIS. 2. Breast, left, needle core biopsy - FAT NECROSIS. - NO EVIDENCE OF MALIGNANCY.      10/17/2017 Genetic Testing    Negative genetic testing on the Multicancer  panel.  The Multi-Gene Panel offered by [facility] includes sequencing and/or deletion duplication testing of the following 83 genes: ALK, APC, ATM, AXIN2,BAP1,  BARD1, BLM, BMPR1A, BRCA1, BRCA2, BRIP1, CASR, CDC73, CDH1, CDK4, CDKN1B, CDKN1C, CDKN2A (p14ARF), CDKN2A (p16INK4a), CEBPA, CHEK2, CTNNA1, DICER1, DIS3L2, EGFR (c.2369C>T, p.Thr790Met variant only), EPCAM (Deletion/duplication testing only), FH, FLCN, GATA2, GPC3, GREM1 (Promoter region deletion/duplication testing only), HOXB13 (c.251G>A, p.Gly84Glu), HRAS, KIT, MAX, MEN1, MET, MITF (c.952G>A, p.Glu318Lys variant only), MLH1, MSH2, MSH3, MSH6, MUTYH, NBN, NF1, NF2, NTHL1, PALB2, PDGFRA, PHOX2B, PMS2, POLD1, POLE, POT1, PRKAR1A, PTCH1, PTEN, RAD50, RAD51C, RAD51D, RB1, RECQL4, RET, RUNX1, SDHAF2, SDHA (sequence changes only), SDHB, SDHC, SDHD, SMAD4, SMARCA4, SMARCB1, SMARCE1, STK11, SUFU, TERT, TERT, TMEM127, TP53, TSC1, TSC2, VHL, WRN and WT1.  The report date is October 17, 2017.       Breast cancer of upper-outer quadrant of right female breast ([place])   09/14/2017 Mammogram    Diagnostic mammogram at [facility]  IMPRESSION:  The new 0.9 cm oval high density mass in the right breast posterior depth superior region seen on the mediolateral oblique view only is indeterminate. The 1.1 cm focal asymmetry in the left breast central to the nipple anterior depth is indeterminate.       09/14/2017 Imaging    [name] Bilateral at [facility] IMPRESSION: The 1 cm oval mass in the right breast at 9:30 posterior depth is highly suggestive of malignancy. The 0.8 cm round mass in the left breast at 11-12 o'clock anterior depth is suspicious of malignancy.       09/22/2017 Cancer Staging    Staging form: Breast, AJCC 8th Edition - Clinical stage from 09/22/2017: Stage IB (cT1b, cN0, cM0, G2, ER-, PR-, HER2-) - Signed by Truitt Merle, MD on 10/08/2017      09/22/2017 Pathology Results    Bilateral needle core biopsy 1. Breast, right, needle core biopsy - INVASIVE DUCTAL  CARCINOMA, MSBR GRADE II/III. - DUCTAL CARCINOMA IN SITU WITH NECROSIS. 2. Breast, left, needle core biopsy - FAT NECROSIS. - NO EVIDENCE OF MALIGNANCY.      09/25/2017 Receptors her2    Estrogen Receptor: 0 Progesterone 0 HER2: Negative. Ki-67: 80%       10/08/2017 Initial Diagnosis    Breast cancer of upper-outer quadrant of right female breast ([place])      10/26/2017 Surgery    BREAST LUMPECTOMY WITH RADIOACTIVE SEED AND SENTINEL LYMPH NODE BIOPSY by Dr. Excell Seltzer  10/26/17      10/26/2017 Pathology Results    Diagnosis 10/26/17 1. Breast, lumpectomy, Right - INVASIVE DUCTAL CARCINOMA, GRADE 2, SPANNING 1 CM. - HIGH GRADE DUCTAL CARCINOMA IN SITU WITH NECROSIS. - FINAL RESECTION MARGINS (PARTS #2, 3, 4) ARE NEGATIVE. - BIOPSY SITE. - SEE ONCOLOGY TABLE. 2. Breast, excision, additional anterior margin- 2 pieces right - BENIGN BREAST TISSUE. 3. Breast, excision, Right unoriented anterior margin - BENIGN BREAST TISSUE. 4. Breast, excision, Right deep  margin - BENIGN BREAST TISSUE. 5. Lymph node, sentinel, biopsy, Right Axillary - ONE OF ONE LYMPH NODES NEGATIVE FOR CARCINOMA (0/1).      10/26/2017 Cancer Staging    Staging form: Breast, AJCC 8th Edition - Pathologic stage from 10/26/2017: Stage IB (pT1b, pN0, cM0, G2, ER-, PR-, HER2-) - Signed by Truitt Merle, MD on 11/12/2017      12/03/2017 -  Chemotherapy    Weekly Abraxane starting 12/03/17        CURRENT THERAPY:   -Anastrozole switched to Aromasin in 2015 due to diarrhea. Aromasin was switched to Letrozole in 08/2017 due to high copay. Plan to stop letrozole end of 11/2017 before chemo. -Adjuvant weekly Abraxane for 12 weeks starting on 12/03/17   INTERVAL HISTORY: Adrienne [name] returns for follow-up and cycle 1 weekly Abraxane. She presents to the clinic today with her son and granddaughter. She notes she is doing well but apprehensive about chemo. She notes she recently saw her surgeon and got fluid removed from her breast. She  plans to follow up with him in 6 months. She ans her family addressed their questions and she is prepared to start chemo today.      PAST MEDICAL HISTORY: Past Medical History:  Diagnosis Date  . Allergy   . Atrial fibrillation ([place])   . Breast cancer ([place]) 08/25/12   Invasive ductal ca,DCIS  . Diabetes mellitus    type II  . Family history of cancer   . Gout   . Hearing loss   . History of breast cancer   . History of frequent urinary tract infections   . Hx of radiation therapy 11/22/12- 12/19/12   breast 4250 cGy 17 sessions, left breast boost 750 cGy 3 sessions  . Hyperlipidemia   . Hypertension   . Osteopenia     PAST SURGICAL HISTORY: Past Surgical History:  Procedure Laterality Date  . ABDOMINAL HYSTERECTOMY Bilateral 1999   w/b/l salpingo-oopherectomy  . APPENDECTOMY    . BREAST LUMPECTOMY WITH NEEDLE LOCALIZATION AND AXILLARY SENTINEL LYMPH NODE BX Left 09/12/2012   Procedure: LEFT NEEDLE LOCALIZATION BREAST LUMPECTOMY TION AND AXILLARY SENTINEL LYMPH NODE BX;  Surgeon: Edward Jolly, MD;  Location: [facility];  Service: General;  Laterality: Left;  . BREAST LUMPECTOMY WITH RADIOACTIVE SEED AND SENTINEL LYMPH NODE BIOPSY Right 10/26/2017   Procedure: BREAST LUMPECTOMY WITH RADIOACTIVE SEED AND SENTINEL LYMPH NODE BIOPSY;  Surgeon: Excell Seltzer, MD;  Location: [place];  Service: General;  Laterality: Right;  . BREAST SURGERY  1990's   right- fibroid cyst  . CHOLECYSTECTOMY    . SEPTOPLASTY    . TONSILLECTOMY      FAMILY HISTORY Family History  Problem Relation Age of Onset  . Hypertension Mother   . Heart disease Mother        Murmur and irregular heart disease  . Dementia Mother        d. 48  . Hypertension Father   . Aneurysm Father 40  . Breast cancer Cousin 107       mat first cousin    GYNECOLOGIC HISTORY: menarche at age 71, g45 p3, s/p TAH/BSO in early 39s.  Was on HRT for several years (cannot recall the duration).  No h/o  abnormal pap smears or sexually transmitted infections.    SOCIAL HISTORY: Lives in [place], [place] in a three story house, will move to [facility] soon.  Her husband lives at a nursing home due to dementia.  She is a  retired Pharmacist, hospital.  Her son and daughter live in [place].      ADVANCED DIRECTIVES: Not in place.    HEALTH MAINTENANCE: Social History   Tobacco Use  . Smoking status: Former Smoker    Packs/day: 0.25    Years: 4.00    Pack years: 1.00    Types: Cigarettes  . Smokeless tobacco: Never Used  . Tobacco comment: Cigarette use was 50 years ago  Substance Use Topics  . Alcohol use: Yes    Comment: Occ glass of wine with dinner  . Drug use: No    Mammogram: 09/14/2017  Colonoscopy: 2011 Bone Density Scan: 03/05/2015 osteopenia  Pap Smear: s/p tah/bso Eye Exam: due Vitamin D Level:  Normal, 04/14/13 Lipid Panel: unsure   Allergies  Allergen Reactions  . Penicillins Hives and Swelling  . Prednisone Other (See Comments)    ELEVATED BLOOD SUGAR  . Radiaplexrx [Woun'Dres Hydrogel Wound Dress] Hives    Current Outpatient Medications  Medication Sig Dispense Refill  . ACCU-CHEK AVIVA PLUS test strip USE TO TEST TWICE DAILY **ICD-10 CODE E13.9** 200 each 3  . allopurinol (ZYLOPRIM) 300 MG tablet TAKE 1 TABLET (300 MG TOTAL) BY MOUTH DAILY. **DNF 04/06/15** 90 tablet 3  . apixaban (ELIQUIS) 5 MG TABS tablet Take 1 tablet (5 mg total) by mouth 2 (two) times daily. 180 tablet 3  . chlorpheniramine (CHLOR-TRIMETON) 4 MG tablet Take 4 mg 2 (two) times daily as needed by mouth for allergies.    Marland Kitchen diltiazem (CARDIZEM CD) 360 MG 24 hr capsule TAKE 1 CAPSULE (360 MG TOTAL) BY MOUTH DAILY. 90 capsule 3  . fish oil-omega-3 fatty acids 1000 MG capsule Take 1 g by mouth daily.    . metFORMIN (GLUCOPHAGE) 500 MG tablet Take 1 tablet (500 mg total) by mouth 2 (two) times daily. 180 tablet 3  . Multiple Vitamin (MULTIVITAMIN) tablet Take 1 tablet by mouth daily.    . ondansetron  (ZOFRAN) 8 MG tablet Take 1 tablet (8 mg total) by mouth 2 (two) times daily as needed (Nausea or vomiting). 30 tablet 1  . OVER THE COUNTER MEDICATION Take by mouth 2 (two) times daily. Presavision- for macular degeneration prevention    . prochlorperazine (COMPAZINE) 10 MG tablet Take 1 tablet (10 mg total) by mouth every 6 (six) hours as needed (Nausea or vomiting). 30 tablet 1  . VITAMIN D, ERGOCALCIFEROL, PO Take by mouth.    Marland Kitchen atenolol (TENORMIN) 25 MG tablet TAKE 1 TABLET (25 MG TOTAL) BY MOUTH DAILY. 30 tablet 10   No current facility-administered medications for this visit.    REVIEW OF SYSTEMS:   Constitutional: Denies fevers, chills or abnormal night sweats Eyes: Denies blurriness of vision, double vision or watery eyes Ears, nose, mouth, throat, and face: Denies mucositis or sore throat Respiratory: Denies cough, dyspnea or wheezes Cardiovascular: Denies palpitation, chest discomfort or lower extremity swelling Gastrointestinal:  Denies nausea, heartburn or change in bowel habits Skin: Denies abnormal skin rashes Lymphatics: Denies new lymphadenopathy or easy bruising Neurological:Denies numbness, tingling or new weaknesses Behavioral/Psych: Mood is stable, no new changes  BREAST: (+) right axilla incision irritation All other systems were reviewed with the patient and are negative.  OBJECTIVE:   Vitals:   12/03/17 1114  BP: 132/82  Pulse: 68  Resp: 17  Temp: 98.1 F (36.7 C)  SpO2: 97%     Body mass index is 26 kg/m.     GENERAL: Patient is a well appearing female in no acute  distress HEENT:  Sclerae anicteric.  Oropharynx clear and moist. No ulcerations or evidence of oropharyngeal candidiasis. Neck is supple.  NODES:  No cervical, supraclavicular, or axillary lymphadenopathy palpated.  LUNGS:  Clear to auscultation bilaterally.  No wheezes or rhonchi. HEART:  Regular rate and rhythm. No murmur appreciated. ABDOMEN:  Soft, nontender.  Positive, normoactive bowel  sounds. No organomegaly palpated. MSK:  No focal spinal tenderness to palpation. Full range of motion bilaterally in the upper extremities. EXTREMITIES:  No peripheral edema.   SKIN:  Clear with no obvious rashes or skin changes. No nail dyscrasia. NEURO:  Nonfocal. Well oriented.  Appropriate affect. Breasts: Breast inspection showed them to be symmetrical with no nipple discharge. (+) S/p Right Breast Lumpectomy: Surgical incision in right breast and axilla healing very well, no discharge. Palpation of the breasts and axilla revealed no obvious mass that I could appreciate.    ECOG FS:1 - Symptomatic but completely ambulatory  LAB RESULTS: CBC Latest Ref Rng & Units 12/03/2017 10/26/2017 09/20/2017  WBC 3.9 - 10.3 K/uL 7.7 - 7.3  Hemoglobin 11.6 - 15.9 g/dL 14.0 15.3(H) 14.7  Hematocrit 34.8 - 46.6 % 42.8 45.0 44.9  Platelets 145 - 400 K/uL 195 - 180    CMP Latest Ref Rng & Units 12/03/2017 10/26/2017 10/21/2017  Glucose 70 - 99 mg/dL 134(H) 182(H) 221(H)  BUN 8 - 23 mg/dL 23 29(H) 25(H)  Creatinine 0.44 - 1.00 mg/dL 1.18(H) 1.00 1.45(H)  Sodium 135 - 145 mmol/L 139 139 140  Potassium 3.5 - 5.1 mmol/L 4.1 4.0 4.4  Chloride 98 - 111 mmol/L 105 104 104  CO2 22 - 32 mmol/L 22 - 27  Calcium 8.9 - 10.3 mg/dL 9.4 - 9.9  Total Protein 6.5 - 8.1 g/dL 7.0 - -  Total Bilirubin 0.3 - 1.2 mg/dL 0.6 - -  Alkaline Phos 38 - 126 U/L 95 - -  AST 15 - 41 U/L 18 - -  ALT 0 - 44 U/L 21 - -   PATHOLOGY REPORT   Diagnosis 10/26/17 1. Breast, lumpectomy, Right - INVASIVE DUCTAL CARCINOMA, GRADE 2, SPANNING 1 CM. - HIGH GRADE DUCTAL CARCINOMA IN SITU WITH NECROSIS. - FINAL RESECTION MARGINS (PARTS #2, 3, 4) ARE NEGATIVE. - BIOPSY SITE. - SEE ONCOLOGY TABLE. 2. Breast, excision, additional anterior margin- 2 pieces right - BENIGN BREAST TISSUE. 3. Breast, excision, Right unoriented anterior margin - BENIGN BREAST TISSUE. 4. Breast, excision, Right deep margin - BENIGN BREAST TISSUE. 5. Lymph node,  sentinel, biopsy, Right Axillary - ONE OF ONE LYMPH NODES NEGATIVE FOR CARCINOMA (0/1). Microscopic Comment 1. INVASIVE CARCINOMA OF THE BREAST: Resection Procedure: Right breast lumpectomy with additional anterior and posterior margin. Right axillary lymph node biopsy. Specimen Laterality: Right. Tumor Size: 1 cm. Histologic Type: Invasive ductal carcinoma. Histologic Grade: Glandular (Acinar)/Tubular Differentiation: 2 1 of 3 Amended copy Corrected FINAL for DONNI, OGLESBY ([id number]) Microscopic Comment(continued) Nuclear Pleomorphism: 2 Mitotic Rate: 2 Overall Grade: 2 Ductal Carcinoma In Situ: Present, high grade with necrosis. Tumor Extension: Confined to breast parenchyma. Margins: Distance from closest margin (millimeters): Originally <1 mm anterior, 1 mm posterior. Final anterior and posterior margins (parts 2, 3, 4) are negative. Specify closest margin (required only if <70m): Final margins >10 mm. DCIS Margins Distance from closest margin (millimeters): Original 2-3 mm anterior, 1 mm posterior. Final anterior and posterior margins (parts 2, 3, 4) are negative. Specify closest margin (required only if <129m: Final margins >10 mm Regional Lymph Nodes: Number of Lymph Nodes Examined:  1 Number of Sentinel Nodes Examined (if applicable): 1 Number of Lymph Nodes with Macrometastases (>2 mm): 0 Number of Lymph Nodes with Micrometastases: 0 Number of Lymph Nodes with Isolated Tumor Cells (0.2 mm or 200 cells)#: 0 Size of Largest Metastatic Deposit (millimeters): N/A. Extranodal Extension N/A. Treatment Effect: No known presurgical therapy Breast Biomarker Testing Performed on Previous Biopsy: Testing Performed on Case Number: [id number] Estrogen Receptor: 0 Progesterone 0 HER2: Negative. Ki-67: 80% Representative tumor block: 1A-C. Pathologic Stage Classification (pTNM, AJCC 8th Edition): pT1b, pN0 (v4.2.0.0)   Diagnosis, 09/22/2017 1. Breast, right, needle  core biopsy - INVASIVE DUCTAL CARCINOMA, MSBR GRADE II/III. - DUCTAL CARCINOMA IN SITU WITH NECROSIS. 2. Breast, left, needle core biopsy - FAT NECROSIS. - NO EVIDENCE OF MALIGNANCY.   Diagnosis  08/28/2015 Breast, left, needle core biopsy - FAT NECROSIS. - THERE IS NO EVIDENCE OF MALIGNANCY. - SEE COMMENT.         STUDIES: I have requested her recent mammogram report from [facility].  DEXA scan, 09/20/2017  Osteopenia, very high risk of fracture, 29% for major osteoporotic fracture and 18% for hip fracture.   Breast Diagnostic 09/14/17 at [facility]  IMPRESSION:  The new 0.9 cm oval high density mass in the right breast posterior depth superior region seen on the mediolateral oblique view only is indeterminate. The 1.1 cm focal asymmetry in the left breast central to the nipple anterior depth is indeterminate.   [name] Bilateral, 09/14/2017 at [facility] IMPRESSION: The 1 cm oval mass in the right breast at 9:30 posterior depth is highly suggestive of malignancy. The 0.8 cm round mass in the left breast at 11-12 o'clock anterior depth is suspicious of malignancy.    [name] Outside Films Breast 03/09/17  IMPRESSION: 1. No significant change in oval masses in the left breast at the 11:00 cm from the nipple and 10:30 4 cm from the nipple.  Both of these masses resemble the area of fat necrosis previously biopsied at 2:00. In addition, both of these masses have developing central benign or dystrophic appearing calcifications and are favored to be other areas of fat necrosis.  A 6 month follow up bilateral mammogram and left breast US is recommended.  [name] Outside films Breast form [facility] 09/01/2016 IMPRESSION: Probably Benign 1. No significant change in oval hypoechoic masses in the left breast at 11:00 2 cm from the nipple and 10:30 4 cm from the nipple. Both of these masses resemble the area of fat necrosis previously biopsies at 2:00. In addition, both masses have developing central benign or dystrophic  appearing calcifications and are favored to be other areas of fat necrosis. A 6 month follow-up left breast mammogram and ultrasound is recommended.  HM Mammogram from [facility] 09/01/16 IMPRESSION  Incomplete - additional imaging evaluation needed Ultrasound of the upper medial left breast mass is recommended.   Left breast mammogram and ultrasound 02/27/2016 Impression: probably benign The 4 mm oval mass in the left breast 12:00 position likely represent fat necrosis, the 3 mm mass in the left breast upper inner quadrant middle depth most likely is fat necrosis, the 4 mm oval cyst in the left breast upper inner quadrant posterior depth is benign. Post surgical scar in the left breast upper outer quadrant anterior depth is benign.  ASSESSMENT: 83 y.o. [place], [place] woman   1.  Breast cancer of upper outer quadrant of right breast, invasive ductal carcinoma, stage IB, pT1bN0M0, grade 2, Triple Negative, Ki67: 80% -We previously discussed that her newly diagnosed right breast  is different from her previous left breast cancer. It is triple negative and more aggressive. We previously discussed moderate to high risk of recurrence, and the role of adjuvant chemo. Due to her advanced age, she is not a candidate for intensive chemotherapy. However she does have overall good health and performance status, she likely would be able to tolerate single agent Taxol or Abraxane if needed. -Her genetic testing was negative for 83 gene sequencing. -She previously underwent right breast lumpectomy with SLNB by Dr. Excell Seltzer on 10/26/17. I discussed her pathology which shows a 1cm tumor, G2, triple negative tumor was completely resection with negative margin, node negative. I discussed this is more aggressive than her previous cancer.  -I previously discussed there is a 20-30% risk of recurrence of triple negative breast cancer. Standard of care would include adjuvant chemotherapy.  She is a 83 year old, she lives  independently, no significant comorbidities, likely would be able to tolerate low intensity chemo. Given her age, I do not recommend intensive chemotherapy such as AC-T or TC. Her options include adjuvant weekly taxol or Abraxane for 12 cycles or CMF every 3 weeks for 6 cycles. We also discussed surveillance alone without adjuvant chemotherapy. After lengthy discussion she expressed her wishes to try chemotherapy, and she opted weekly Abraxane. Due to her advanced age and diabetes, Abraxane would be a better option than Taxol. Side effects were discussed with patient in great detail and she gave chemo consent. -I previously discussed radiation will reduce her risk of local recurrence. She is interested. Will proceed after chemotherapy.  -the goal of therapy is curative  -Given IV therapy at her age, I previously discussed the option of PAC placement, PICC line placement or proceeding with peripheral infusion. She will try peripheral access first.   -I addressed the patient's and family's concern with chemotherapy today. I discussed the difference in her previous and current breast cancer. I dicussed given her triple disease is agressive chemotherapy will help reduce her risk of recurrence. I again reviewed side effects. We will watch her blood counts and neuropathy. She is prepared to start.  -I suggest she try cryotherapy with ice bags on hands and feet to help reduce chances for neuropathy. She is interested.  -I will start her at lower dose. Although the standard is 3 months I may stop early if she has significant side effects. If she is not able to tolerate treatment I will reduce dose and or discontinue.  -Labs reviewed, CBC WNL and Cr at 1.18, Bg at 134, overall adequate to proceed with Abraxane today. Will reduce dose for cycle 1 to see how she tolerates  -I encouraged her to contact our clinic if she develops significant or unexpected side effects and present to the ED if she develops fever.  -f/u  in 1 week    2. pT1bN0, stage IA invasive ductal carcinoma of the left breast, grade I, ER 100%, PR 88%, Ki-67 16%, HER-2/neu negative. -she previously underwent lumpectomy with sentinel lymph node biopsy on 09/12/2012, s/p radiation therapy from 11/09/2012 through 12/19/2012.  - Her left breast biopsy in 08/2015 showed fat necrosis. No clinical evidence of recurrence. -Her repeated mammogram in May 2018 showed 2 lesions in UIQ of left breast, stable, likely benign (fat necrosis per biopsy in 2017) -Left breast mammogram and ultrasound on 03/09/2017 reveal hypoechoic masses that have developed central benign or dystrophic appearing calcifications and are favored to be areas of fat necrosis. -she was on adjuvant Aromasin, tolerating well -09/16/17 US  showed mass behind her left breast scar and a mass In her right breast, both highly suggestive of malignancy.  -Her Exemestane was switched to letrozole in 08/2017 due to high copay. She is tolerating well and completed in 11/2017.   3. Osteopenia -She is on vitamin D supplement daily. -Given her worsening hypercalcemia, I told her to stop calcium  -Her recent bone density scan from 03/05/2015 showed osteopenia, with 10 year probability of major ostial parotic fracture 25%, and hip fracture 15%.  -Given the high risk of fracture, she would benefit from bisphosphonate or Prolia injection. I previously discussed the benefit and potential side effects, and written material of Prolia was given. She has not done Prolia injection yet and she is hesitant to due to the concern of side effects. I will discuss the options of adjuvant Zometa after her chemotherapy.  -Patient most recent DEXA scan on 09/20/2017 showed: Osteopenia, very high risk of fracture, 29% for major osteoporotic fracture and 18% for hip fracture.    4. Hypercalcemia  -She knows to drink adequately and avoid dehydration  -She was previously instructed to restart Vit D in 03/2017 as well as  continue light weight bearing exercise.  -She'll follow up with primary care physician for further workup.  -Calcium currently normalized   5. Diabetes and hyperglycemia  -She noticed hyperglycemia since she started steroids for her left hip pain. I previously suggested she stop taking her steroid medication as she no longer needs it.  -I previously encouraged her to f/u with PCP to see if she needs to change her metformin medication or dosage as her sugar has been higher lately.  -BG at 134 today (12/03/17)  6. Social Support -She lives at [facility] and is very independent -Her children live close by her    Plan -Lab reviewed and adequate to proceed with cycle 1 Abraxane at lowered dose '60mg'$ /m2 today to see how she tolerated  -Lab, f/u and Abraxane in 1 week, will increase dose to '80mg'$ /m2 from cycle 2 if she tolerates well  -f/u weekly for first 3-4 weeks    I spent 20 minutes counseling the patient face to face.  The total time spent in the appointment was 25 minutes.  Oneal Deputy, am acting as scribe for Truitt Merle, MD.   I have reviewed the above documentation for accuracy and completeness, and I agree with the above.    Truitt Merle   12/03/2017

## 2017-12-02 ENCOUNTER — Telehealth: Payer: Self-pay | Admitting: *Deleted

## 2017-12-02 NOTE — Telephone Encounter (Signed)
I called back, and left a detailed message.  We discussed her breast cancer diagnosis, staging and the risk of recurrence after surgery.  I discussed the benefit of chemo.  I encouraged her to come me with patient tomorrow for further discussion.  She knows to call me if she has further questions.  Truitt Merle MD

## 2017-12-02 NOTE — Telephone Encounter (Signed)
Daughter Reed Pandy called requesting to talk to Dr. Burr Medico.  Per Belenda Cruise, pt is due for First chemo on  12/03/17.   Belenda Cruise would like to discuss with Dr. Burr Medico the rationale and reason for pt taking chemo.  Family is weighing on the decision whether pt could benefit at all from taking chemo. [facility]    Phone      [phone].

## 2017-12-03 ENCOUNTER — Inpatient Hospital Stay: Payer: Medicare Other

## 2017-12-03 ENCOUNTER — Telehealth: Payer: Self-pay | Admitting: Hematology

## 2017-12-03 ENCOUNTER — Encounter: Payer: Self-pay | Admitting: Hematology

## 2017-12-03 ENCOUNTER — Inpatient Hospital Stay: Payer: Medicare Other | Attending: Hematology

## 2017-12-03 ENCOUNTER — Inpatient Hospital Stay: Payer: Medicare Other | Admitting: Hematology

## 2017-12-03 ENCOUNTER — Other Ambulatory Visit: Payer: Self-pay

## 2017-12-03 ENCOUNTER — Other Ambulatory Visit: Payer: Self-pay | Admitting: Hematology

## 2017-12-03 VITALS — BP 132/82 | HR 68 | Temp 98.1°F | Resp 17 | Ht 61.0 in | Wt 137.6 lb

## 2017-12-03 DIAGNOSIS — M8580 Other specified disorders of bone density and structure, unspecified site: Secondary | ICD-10-CM | POA: Insufficient documentation

## 2017-12-03 DIAGNOSIS — D709 Neutropenia, unspecified: Secondary | ICD-10-CM | POA: Insufficient documentation

## 2017-12-03 DIAGNOSIS — E1165 Type 2 diabetes mellitus with hyperglycemia: Secondary | ICD-10-CM

## 2017-12-03 DIAGNOSIS — Z87891 Personal history of nicotine dependence: Secondary | ICD-10-CM | POA: Diagnosis not present

## 2017-12-03 DIAGNOSIS — Z171 Estrogen receptor negative status [ER-]: Secondary | ICD-10-CM

## 2017-12-03 DIAGNOSIS — Z5111 Encounter for antineoplastic chemotherapy: Secondary | ICD-10-CM | POA: Insufficient documentation

## 2017-12-03 DIAGNOSIS — C50412 Malignant neoplasm of upper-outer quadrant of left female breast: Secondary | ICD-10-CM

## 2017-12-03 DIAGNOSIS — C50411 Malignant neoplasm of upper-outer quadrant of right female breast: Secondary | ICD-10-CM | POA: Insufficient documentation

## 2017-12-03 DIAGNOSIS — G629 Polyneuropathy, unspecified: Secondary | ICD-10-CM | POA: Diagnosis not present

## 2017-12-03 DIAGNOSIS — C50912 Malignant neoplasm of unspecified site of left female breast: Secondary | ICD-10-CM | POA: Diagnosis present

## 2017-12-03 DIAGNOSIS — Z170 Estrogen receptor positive status [ER+]: Secondary | ICD-10-CM

## 2017-12-03 DIAGNOSIS — L989 Disorder of the skin and subcutaneous tissue, unspecified: Secondary | ICD-10-CM | POA: Insufficient documentation

## 2017-12-03 LAB — CBC WITH DIFFERENTIAL (CANCER CENTER ONLY)
BASOS ABS: 0.1 10*3/uL (ref 0.0–0.1)
Basophils Relative: 1 %
EOS PCT: 2 %
Eosinophils Absolute: 0.1 10*3/uL (ref 0.0–0.5)
HEMATOCRIT: 42.8 % (ref 34.8–46.6)
Hemoglobin: 14 g/dL (ref 11.6–15.9)
LYMPHS PCT: 22 %
Lymphs Abs: 1.7 10*3/uL (ref 0.9–3.3)
MCH: 31.4 pg (ref 25.1–34.0)
MCHC: 32.6 g/dL (ref 31.5–36.0)
MCV: 96.4 fL (ref 79.5–101.0)
MONO ABS: 0.7 10*3/uL (ref 0.1–0.9)
MONOS PCT: 9 %
NEUTROS ABS: 5.1 10*3/uL (ref 1.5–6.5)
Neutrophils Relative %: 66 %
PLATELETS: 195 10*3/uL (ref 145–400)
RBC: 4.45 MIL/uL (ref 3.70–5.45)
RDW: 13.8 % (ref 11.2–14.5)
WBC Count: 7.7 10*3/uL (ref 3.9–10.3)

## 2017-12-03 LAB — CMP (CANCER CENTER ONLY)
ALBUMIN: 3.9 g/dL (ref 3.5–5.0)
ALK PHOS: 95 U/L (ref 38–126)
ALT: 21 U/L (ref 0–44)
ANION GAP: 12 (ref 5–15)
AST: 18 U/L (ref 15–41)
BILIRUBIN TOTAL: 0.6 mg/dL (ref 0.3–1.2)
BUN: 23 mg/dL (ref 8–23)
CO2: 22 mmol/L (ref 22–32)
Calcium: 9.4 mg/dL (ref 8.9–10.3)
Chloride: 105 mmol/L (ref 98–111)
Creatinine: 1.18 mg/dL — ABNORMAL HIGH (ref 0.44–1.00)
GFR, Est AFR Am: 49 mL/min — ABNORMAL LOW (ref >60)
GFR, Estimated: 42 mL/min — ABNORMAL LOW (ref >60)
GLUCOSE: 134 mg/dL — AB (ref 70–99)
POTASSIUM: 4.1 mmol/L (ref 3.5–5.1)
SODIUM: 139 mmol/L (ref 135–145)
TOTAL PROTEIN: 7 g/dL (ref 6.5–8.1)

## 2017-12-03 MED ORDER — HEPARIN SOD (PORK) LOCK FLUSH 100 UNIT/ML IV SOLN
500.0000 [IU] | Freq: Once | INTRAVENOUS | Status: DC | PRN
  Filled 2017-12-03: qty 5

## 2017-12-03 MED ORDER — PROCHLORPERAZINE MALEATE 10 MG PO TABS
10.0000 mg | ORAL_TABLET | Freq: Once | ORAL | Status: AC
  Administered 2017-12-03: 10 mg via ORAL

## 2017-12-03 MED ORDER — PROCHLORPERAZINE MALEATE 10 MG PO TABS
ORAL_TABLET | ORAL | Status: AC
  Filled 2017-12-03: qty 1

## 2017-12-03 MED ORDER — SODIUM CHLORIDE 0.9 % IV SOLN
Freq: Once | INTRAVENOUS | Status: AC
  Administered 2017-12-03: 13:00:00 via INTRAVENOUS
  Filled 2017-12-03: qty 250

## 2017-12-03 MED ORDER — PACLITAXEL PROTEIN-BOUND CHEMO INJECTION 100 MG
60.0000 mg/m2 | Freq: Once | INTRAVENOUS | Status: AC
  Administered 2017-12-03: 100 mg via INTRAVENOUS
  Filled 2017-12-03: qty 20

## 2017-12-03 MED ORDER — SODIUM CHLORIDE 0.9% FLUSH
10.0000 mL | INTRAVENOUS | Status: DC | PRN
  Filled 2017-12-03: qty 10

## 2017-12-03 NOTE — Progress Notes (Signed)
Met w/ pt to introduce myself as her Arboriculturist.  Pt doesn't have a deductible and it appears her ins will pay for her treatment at 100% but I will monitor her account and if she's billed for chemotherapy I will reach out to her to apply for copay assistance if it's available at that time.  Pt gave me her income and is overqualified for the [employer].  She has my card for any questions or concerns she may have in the future.

## 2017-12-03 NOTE — Patient Instructions (Addendum)
[facility] Discharge Instructions for Patients Receiving Chemotherapy  Today you received the following chemotherapy agents:  ABRAXANE.  To help prevent nausea and vomiting after your treatment, we encourage you to take your nausea medication as directed.   If you develop nausea and vomiting that is not controlled by your nausea medication, call the clinic.   BELOW ARE SYMPTOMS THAT SHOULD BE REPORTED IMMEDIATELY:  *FEVER GREATER THAN 100.5 F  *CHILLS WITH OR WITHOUT FEVER  NAUSEA AND VOMITING THAT IS NOT CONTROLLED WITH YOUR NAUSEA MEDICATION  *UNUSUAL SHORTNESS OF BREATH  *UNUSUAL BRUISING OR BLEEDING  TENDERNESS IN MOUTH AND THROAT WITH OR WITHOUT PRESENCE OF ULCERS  *URINARY PROBLEMS  *BOWEL PROBLEMS  UNUSUAL RASH Items with * indicate a potential emergency and should be followed up as soon as possible.  Feel free to call the clinic should you have any questions or concerns. The clinic phone number is [phone].  Please show the [facility] at check-in to the Emergency Department and triage nurse.  Nanoparticle Albumin-Bound Paclitaxel injection What is this medicine? NANOPARTICLE ALBUMIN-BOUND PACLITAXEL (Na no PAHR ti kuhl al BYOO muhn-bound PAK li TAX el) is a chemotherapy drug. It targets fast dividing cells, like cancer cells, and causes these cells to die. This medicine is used to treat advanced breast cancer and advanced lung cancer. This medicine may be used for other purposes; ask your health care provider or pharmacist if you have questions. COMMON BRAND NAME(S): Abraxane What should I tell my health care provider before I take this medicine? They need to know if you have any of these conditions: -kidney disease -liver disease -low blood counts, like low platelets, red blood cells, or white blood cells -recent or ongoing radiation therapy -an unusual or allergic reaction to paclitaxel, albumin, other chemotherapy, other medicines,  foods, dyes, or preservatives -pregnant or trying to get pregnant -breast-feeding How should I use this medicine? This drug is given as an infusion into a vein. It is administered in a hospital or clinic by a specially trained health care professional. Talk to your pediatrician regarding the use of this medicine in children. Special care may be needed. Overdosage: If you think you have taken too much of this medicine contact a poison control center or emergency room at once. NOTE: This medicine is only for you. Do not share this medicine with others. What if I miss a dose? It is important not to miss your dose. Call your doctor or health care professional if you are unable to keep an appointment. What may interact with this medicine? -cyclosporine -diazepam -ketoconazole -medicines to increase blood counts like filgrastim, pegfilgrastim, sargramostim -other chemotherapy drugs like cisplatin, doxorubicin, epirubicin, etoposide, teniposide, vincristine -quinidine -testosterone -vaccines -verapamil Talk to your doctor or health care professional before taking any of these medicines: -acetaminophen -aspirin -ibuprofen -ketoprofen -naproxen This list may not describe all possible interactions. Give your health care provider a list of all the medicines, herbs, non-prescription drugs, or dietary supplements you use. Also tell them if you smoke, drink alcohol, or use illegal drugs. Some items may interact with your medicine. What should I watch for while using this medicine? Your condition will be monitored carefully while you are receiving this medicine. You will need important blood work done while you are taking this medicine. This medicine can cause serious allergic reactions. If you experience allergic reactions like skin rash, itching or hives, swelling of the face, lips, or tongue, tell your doctor or health care  professional right away. In some cases, you may be given additional  medicines to help with side effects. Follow all directions for their use. This drug may make you feel generally unwell. This is not uncommon, as chemotherapy can affect healthy cells as well as cancer cells. Report any side effects. Continue your course of treatment even though you feel ill unless your doctor tells you to stop. Call your doctor or health care professional for advice if you get a fever, chills or sore throat, or other symptoms of a cold or flu. Do not treat yourself. This drug decreases your body's ability to fight infections. Try to avoid being around people who are sick. This medicine may increase your risk to bruise or bleed. Call your doctor or health care professional if you notice any unusual bleeding. Be careful brushing and flossing your teeth or using a toothpick because you may get an infection or bleed more easily. If you have any dental work done, tell your dentist you are receiving this medicine. Avoid taking products that contain aspirin, acetaminophen, ibuprofen, naproxen, or ketoprofen unless instructed by your doctor. These medicines may hide a fever. Do not become pregnant while taking this medicine. Women should inform their doctor if they wish to become pregnant or think they might be pregnant. There is a potential for serious side effects to an unborn child. Talk to your health care professional or pharmacist for more information. Do not breast-feed an infant while taking this medicine. Men are advised not to father a child while receiving this medicine. What side effects may I notice from receiving this medicine? Side effects that you should report to your doctor or health care professional as soon as possible: -allergic reactions like skin rash, itching or hives, swelling of the face, lips, or tongue -low blood counts - This drug may decrease the number of white blood cells, red blood cells and platelets. You may be at increased risk for infections and  bleeding. -signs of infection - fever or chills, cough, sore throat, pain or difficulty passing urine -signs of decreased platelets or bleeding - bruising, pinpoint red spots on the skin, black, tarry stools, nosebleeds -signs of decreased red blood cells - unusually weak or tired, fainting spells, lightheadedness -breathing problems -changes in vision -chest pain -high or low blood pressure -mouth sores -nausea and vomiting -pain, swelling, redness or irritation at the injection site -pain, tingling, numbness in the hands or feet -slow or irregular heartbeat -swelling of the ankle, feet, hands Side effects that usually do not require medical attention (report to your doctor or health care professional if they continue or are bothersome): -aches, pains -changes in the color of fingernails -diarrhea -hair loss -loss of appetite This list may not describe all possible side effects. Call your doctor for medical advice about side effects. You may report side effects to FDA at 1-800-FDA-1088. Where should I keep my medicine? This drug is given in a hospital or clinic and will not be stored at home. NOTE: This sheet is a summary. It may not cover all possible information. If you have questions about this medicine, talk to your doctor, pharmacist, or health care provider.  2018 [employer] Standard (2015-02-20 10:05:20)

## 2017-12-06 ENCOUNTER — Telehealth: Payer: Self-pay

## 2017-12-06 NOTE — Telephone Encounter (Signed)
Left voice message for patient as a follow up from first time chemo on 8/2.  Encouraged her to call back with any questions or concerns about what she is experiencing.

## 2017-12-06 NOTE — Telephone Encounter (Signed)
-----   Message from Sinda Du, RN sent at 12/03/2017  1:05 PM EDT ----- Regarding: Dr. Burr Medico - 1st chemo f/u 1st chemo f/u

## 2017-12-07 ENCOUNTER — Telehealth: Payer: Self-pay | Admitting: Hematology

## 2017-12-07 NOTE — Telephone Encounter (Signed)
Unable to schedule f/u appt In basket message sent to Dr Burr Medico on 8/2.  Per [facility] staff message 8/2 schedule with Lattie Haw or Erasmo Downer- They do not any available appts. Message sent back to Dr Burr Medico regarding next step in scheduling

## 2017-12-10 ENCOUNTER — Inpatient Hospital Stay: Payer: Medicare Other

## 2017-12-10 ENCOUNTER — Telehealth: Payer: Self-pay | Admitting: Hematology

## 2017-12-10 ENCOUNTER — Inpatient Hospital Stay: Payer: Medicare Other | Admitting: Nurse Practitioner

## 2017-12-10 ENCOUNTER — Encounter: Payer: Self-pay | Admitting: Nurse Practitioner

## 2017-12-10 VITALS — BP 138/91 | HR 90 | Temp 98.1°F | Resp 18 | Ht 61.0 in | Wt 136.8 lb

## 2017-12-10 DIAGNOSIS — Z171 Estrogen receptor negative status [ER-]: Secondary | ICD-10-CM | POA: Diagnosis not present

## 2017-12-10 DIAGNOSIS — C50411 Malignant neoplasm of upper-outer quadrant of right female breast: Secondary | ICD-10-CM | POA: Diagnosis not present

## 2017-12-10 DIAGNOSIS — M8580 Other specified disorders of bone density and structure, unspecified site: Secondary | ICD-10-CM

## 2017-12-10 DIAGNOSIS — Z5111 Encounter for antineoplastic chemotherapy: Secondary | ICD-10-CM | POA: Diagnosis not present

## 2017-12-10 DIAGNOSIS — S80862A Insect bite (nonvenomous), left lower leg, initial encounter: Secondary | ICD-10-CM

## 2017-12-10 DIAGNOSIS — E1165 Type 2 diabetes mellitus with hyperglycemia: Secondary | ICD-10-CM | POA: Diagnosis not present

## 2017-12-10 DIAGNOSIS — C50412 Malignant neoplasm of upper-outer quadrant of left female breast: Secondary | ICD-10-CM

## 2017-12-10 DIAGNOSIS — Z170 Estrogen receptor positive status [ER+]: Principal | ICD-10-CM

## 2017-12-10 LAB — CMP (CANCER CENTER ONLY)
ALT: 28 U/L (ref 0–44)
ANION GAP: 12 (ref 5–15)
AST: 23 U/L (ref 15–41)
Albumin: 4.1 g/dL (ref 3.5–5.0)
Alkaline Phosphatase: 96 U/L (ref 38–126)
BILIRUBIN TOTAL: 0.6 mg/dL (ref 0.3–1.2)
BUN: 24 mg/dL — ABNORMAL HIGH (ref 8–23)
CALCIUM: 9.4 mg/dL (ref 8.9–10.3)
CO2: 21 mmol/L — ABNORMAL LOW (ref 22–32)
Chloride: 106 mmol/L (ref 98–111)
Creatinine: 1.01 mg/dL — ABNORMAL HIGH (ref 0.44–1.00)
GFR, EST NON AFRICAN AMERICAN: 51 mL/min — AB (ref >60)
GFR, Est AFR Am: 59 mL/min — ABNORMAL LOW (ref >60)
Glucose, Bld: 137 mg/dL — ABNORMAL HIGH (ref 70–99)
Potassium: 4.1 mmol/L (ref 3.5–5.1)
Sodium: 139 mmol/L (ref 135–145)
TOTAL PROTEIN: 7.2 g/dL (ref 6.5–8.1)

## 2017-12-10 LAB — CBC WITH DIFFERENTIAL (CANCER CENTER ONLY)
BASOS ABS: 0.1 10*3/uL (ref 0.0–0.1)
BASOS PCT: 1 %
EOS ABS: 0.2 10*3/uL (ref 0.0–0.5)
Eosinophils Relative: 2 %
HEMATOCRIT: 41.3 % (ref 34.8–46.6)
HEMOGLOBIN: 13.5 g/dL (ref 11.6–15.9)
Lymphocytes Relative: 31 %
Lymphs Abs: 2 10*3/uL (ref 0.9–3.3)
MCH: 31.4 pg (ref 25.1–34.0)
MCHC: 32.7 g/dL (ref 31.5–36.0)
MCV: 96 fL (ref 79.5–101.0)
Monocytes Absolute: 0.4 10*3/uL (ref 0.1–0.9)
Monocytes Relative: 6 %
NEUTROS ABS: 3.9 10*3/uL (ref 1.5–6.5)
NEUTROS PCT: 60 %
Platelet Count: 215 10*3/uL (ref 145–400)
RBC: 4.3 MIL/uL (ref 3.70–5.45)
RDW: 13.9 % (ref 11.2–14.5)
WBC: 6.5 10*3/uL (ref 3.9–10.3)

## 2017-12-10 MED ORDER — SODIUM CHLORIDE 0.9 % IV SOLN
Freq: Once | INTRAVENOUS | Status: AC
  Administered 2017-12-10: 11:00:00 via INTRAVENOUS
  Filled 2017-12-10: qty 250

## 2017-12-10 MED ORDER — PROCHLORPERAZINE MALEATE 10 MG PO TABS
10.0000 mg | ORAL_TABLET | Freq: Once | ORAL | Status: AC
  Administered 2017-12-10: 10 mg via ORAL

## 2017-12-10 MED ORDER — PROCHLORPERAZINE MALEATE 10 MG PO TABS
ORAL_TABLET | ORAL | Status: AC
  Filled 2017-12-10: qty 1

## 2017-12-10 MED ORDER — PACLITAXEL PROTEIN-BOUND CHEMO INJECTION 100 MG
70.0000 mg/m2 | Freq: Once | INTRAVENOUS | Status: AC
  Administered 2017-12-10: 125 mg via INTRAVENOUS
  Filled 2017-12-10: qty 25

## 2017-12-10 NOTE — Progress Notes (Signed)
Adrienne [name]  [phone] [phone]  Clinic Follow up Note   Patient Care Team: [name], NP as PCP - General (Family Medicine) Lelon Perla, MD as PCP - Cardiology (Cardiology) 12/10/2017  SUMMARY OF ONCOLOGIC HISTORY: Oncology History   Cancer Staging Breast cancer of upper-outer quadrant of right female breast [facility]) Staging form: Breast, AJCC 8th Edition - Clinical stage from 09/22/2017: Stage IB (cT1b, cN0, cM0, G2, ER-, PR-, HER2-) - Signed by Truitt Merle, MD on 10/08/2017  Breast cancer, left breast [facility]) Staging form: Breast, AJCC 7th Edition - Clinical stage from 09/12/2012: Stage IA (T1b, N0, M0) - Unsigned        Breast cancer, left breast ([place])   08/25/2012 Receptors her2    ER 100% positive, PR 88% positive, HER-2 negative, Ki-67 16%    08/30/2012 Initial Diagnosis    Breast cancer, left breast    09/12/2012 Pathology Results    T1bN0 Grade 1 invasive ductal carcinoma, and DCIS.    09/12/2012 Surgery    Left breast lumpectomy and sentinel lymph node biopsy, negative margins.    11/09/2012 - 12/13/2012 Radiation Therapy    Adjuvant breast radiation    12/2012 - 11/2017 Anti-estrogen oral therapy    Anastrozole 1 mg daily, switched to Aromasin 25 mg daily in Jan 2015 due to diarrhea. Her Exemestane was switched to letrozole in 08/2017 due to high copay. She completed her 5 years in 11/2017.     09/01/2016 Imaging    [name] Outside films Breast form [facility] 09/01/2016 IMPRESSION: Probably Benign 1. No significant change in oval hypoechoic masses in the left breast at 11:00 2 cm from the nipple and 10:30 4 cm from the nipple. Both of these masses resemble the area of fat necrosis previously biopsies at 2:00. In addition, both masses have developing central benign or dystrophic appearing calcifications and are favored to be other areas of fat necrosis. A 6 month follow-up left breast mammogram and ultrasound is recommended.    09/01/2016  Mammogram    [facility] Mammogram from [facility] 09/01/16 IMPRESSION  Incomplete - additional imaging evaluation needed Ultrasound of the upper medial left breast mass is recommended.     03/09/2017 Mammogram    Previous lumpectomy changes in the upper outer left breast anterior depth with stable associated dystrophic calcifications.  Biopsy clip at the 2:00 in the left breast near the lumpectomy site corresponding to previously biopsied benign fat necrosis.  Persistent round mass in the upper medial left breast with benign-appearing central round calcification.  No significant masses, calcifications, or other findings are seen in the breast  Impression: ultrasound is recommended for the left breast    03/09/2017 Breast US    Left breast ultrasound impression:  No significant change in oval hypoechoic masses in the left breast at 11:00 2 cm from the nipple and 10:30 4 cm from the nipple.  Both of these masses resemble the area of fat necrosis previously biopsied at 2:00.  In addition, both masses have developing central benign or dystrophic appearing calcifications and are favored to be other areas of fat necrosis.  A 62-monthfollow-up bilateral mammogram and left breast ultrasound is recommended.    09/14/2017 Breast [place]   Breast [place] 09[place]14[place]17 at [facility]  IMPRESSION:  The 1 cm oval mass in the left breast at 9:30 posterior depth is highly suggestive of malignancy. An Ultrasound guided biopsy is recommended.  The 0.8 cm round mass in the left breast at 11-12  o'clock anterior depth is suspicious of malignancy. An ultrasound guided biopsy is recommended.     09/22/2017 Pathology Results    Diagnosis 1. Breast, right, needle core biopsy - INVASIVE DUCTAL CARCINOMA, MSBR GRADE II/III. - DUCTAL CARCINOMA IN SITU WITH NECROSIS. 2. Breast, left, needle core biopsy - FAT NECROSIS. - NO EVIDENCE OF MALIGNANCY.    10/17/2017 Genetic Testing    Negative genetic testing on the Multicancer panel.  The  Multi-Gene Panel offered by [facility] includes sequencing and/or deletion duplication testing of the following 83 genes: ALK, APC, ATM, AXIN2,BAP1,  BARD1, BLM, BMPR1A, BRCA1, BRCA2, BRIP1, CASR, CDC73, CDH1, CDK4, CDKN1B, CDKN1C, CDKN2A (p14ARF), CDKN2A (p16INK4a), CEBPA, CHEK2, CTNNA1, DICER1, DIS3L2, EGFR (c.2369C>T, p.Thr790Met variant only), EPCAM (Deletion/duplication testing only), FH, FLCN, GATA2, GPC3, GREM1 (Promoter region deletion/duplication testing only), HOXB13 (c.251G>A, p.Gly84Glu), HRAS, KIT, MAX, MEN1, MET, MITF (c.952G>A, p.Glu318Lys variant only), MLH1, MSH2, MSH3, MSH6, MUTYH, NBN, NF1, NF2, NTHL1, PALB2, PDGFRA, PHOX2B, PMS2, POLD1, POLE, POT1, PRKAR1A, PTCH1, PTEN, RAD50, RAD51C, RAD51D, RB1, RECQL4, RET, RUNX1, SDHAF2, SDHA (sequence changes only), SDHB, SDHC, SDHD, SMAD4, SMARCA4, SMARCB1, SMARCE1, STK11, SUFU, TERT, TERT, TMEM127, TP53, TSC1, TSC2, VHL, WRN and WT1.  The report date is October 17, 2017.     Breast cancer of upper-outer quadrant of right female breast ([place])   09/14/2017 Mammogram    Diagnostic mammogram at [facility]  IMPRESSION:  The new 0.9 cm oval high density mass in the right breast posterior depth superior region seen on the mediolateral oblique view only is indeterminate. The 1.1 cm focal asymmetry in the left breast central to the nipple anterior depth is indeterminate.     09/14/2017 Imaging    [name] Bilateral at [facility] IMPRESSION: The 1 cm oval mass in the right breast at 9:30 posterior depth is highly suggestive of malignancy. The 0.8 cm round mass in the left breast at 11-12 o'clock anterior depth is suspicious of malignancy.     09/22/2017 Cancer Staging    Staging form: Breast, AJCC 8th Edition - Clinical stage from 09/22/2017: Stage IB (cT1b, cN0, cM0, G2, ER-, PR-, HER2-) - Signed by Truitt Merle, MD on 10/08/2017    09/22/2017 Pathology Results    Bilateral needle core biopsy 1. Breast, right, needle core biopsy - INVASIVE DUCTAL CARCINOMA, MSBR GRADE  II/III. - DUCTAL CARCINOMA IN SITU WITH NECROSIS. 2. Breast, left, needle core biopsy - FAT NECROSIS. - NO EVIDENCE OF MALIGNANCY.    09/25/2017 Receptors her2    Estrogen Receptor: 0 Progesterone 0 HER2: Negative. Ki-67: 80%     10/08/2017 Initial Diagnosis    Breast cancer of upper-outer quadrant of right female breast ([place])    10/26/2017 Surgery    BREAST LUMPECTOMY WITH RADIOACTIVE SEED AND SENTINEL LYMPH NODE BIOPSY by Dr. Excell Seltzer  10/26/17    10/26/2017 Pathology Results    Diagnosis 10/26/17 1. Breast, lumpectomy, Right - INVASIVE DUCTAL CARCINOMA, GRADE 2, SPANNING 1 CM. - HIGH GRADE DUCTAL CARCINOMA IN SITU WITH NECROSIS. - FINAL RESECTION MARGINS (PARTS #2, 3, 4) ARE NEGATIVE. - BIOPSY SITE. - SEE ONCOLOGY TABLE. 2. Breast, excision, additional anterior margin- 2 pieces right - BENIGN BREAST TISSUE. 3. Breast, excision, Right unoriented anterior margin - BENIGN BREAST TISSUE. 4. Breast, excision, Right deep margin - BENIGN BREAST TISSUE. 5. Lymph node, sentinel, biopsy, Right Axillary - ONE OF ONE LYMPH NODES NEGATIVE FOR CARCINOMA (0/1).    10/26/2017 Cancer Staging    Staging form: Breast, AJCC 8th Edition - Pathologic stage from 10/26/2017: Stage IB (pT1b,  pN0, cM0, G2, ER-, PR-, HER2-) - Signed by Truitt Merle, MD on 11/12/2017    12/03/2017 -  Chemotherapy    Weekly Abraxane starting 12/03/17   CURRENT THERAPY:   -Anastrozole switched to Aromasin in 2015 due to diarrhea. Aromasin was switched to Letrozole in 08/2017 due to high copay. Plan to stop letrozole end of 11/2017 before chemo. -Adjuvant weekly Abraxane for 12 weeks starting on 12/03/17  INTERVAL HISTORY: Ms. Hearst returns for follow up and cycle 2 weekly abraxane as scheduled. She completed cycle 1 dose-reduced to 60 mg/m2 on 8/2. She did very well, has not experienced any side effects whatsoever. She continues to be active with ADLs and exercise. Appetite is normal. Denies nausea, vomiting, constipation,  diarrhea, fever, chills, cough, chest pain, dyspnea, body aches, or neuropathy. She got a bug bite on her left leg that does not itch.   REVIEW OF SYSTEMS:   Constitutional: Denies fatigue, fevers, chills, decreased appetite, or abnormal weight loss Ears, nose, mouth, throat, and face: Denies mucositis  Respiratory: Denies cough, dyspnea or wheezes Cardiovascular: Denies palpitation, chest discomfort or lower extremity swelling Gastrointestinal:  Denies nausea, vomiting, constipation, diarrhea, heartburn or change in bowel habits Skin: Denies abnormal skin rashes (+) insect bite left knee  Lymphatics: Denies new lymphadenopathy or easy bruising Neurological:Denies numbness, tingling or new weaknesses Behavioral/Psych: Mood is stable, no new changes  Breast: (+) right breast with seroma recently drained by Dr. Excell Seltzer  All other systems were reviewed with the patient and are negative.  MEDICAL HISTORY:  Past Medical History:  Diagnosis Date  . Allergy   . Atrial fibrillation ([place])   . Breast cancer ([place]) 08/25/12   Invasive ductal ca,DCIS  . Diabetes mellitus    type II  . Family history of cancer   . Gout   . Hearing loss   . History of breast cancer   . History of frequent urinary tract infections   . Hx of radiation therapy 11/22/12- 12/19/12   breast 4250 cGy 17 sessions, left breast boost 750 cGy 3 sessions  . Hyperlipidemia   . Hypertension   . Osteopenia     SURGICAL HISTORY: Past Surgical History:  Procedure Laterality Date  . ABDOMINAL HYSTERECTOMY Bilateral 1999   w/b/l salpingo-oopherectomy  . APPENDECTOMY    . BREAST LUMPECTOMY WITH NEEDLE LOCALIZATION AND AXILLARY SENTINEL LYMPH NODE BX Left 09/12/2012   Procedure: LEFT NEEDLE LOCALIZATION BREAST LUMPECTOMY TION AND AXILLARY SENTINEL LYMPH NODE BX;  Surgeon: Edward Jolly, MD;  Location: [facility];  Service: General;  Laterality: Left;  . BREAST LUMPECTOMY WITH RADIOACTIVE SEED AND SENTINEL LYMPH NODE BIOPSY  Right 10/26/2017   Procedure: BREAST LUMPECTOMY WITH RADIOACTIVE SEED AND SENTINEL LYMPH NODE BIOPSY;  Surgeon: Excell Seltzer, MD;  Location: [facility];  Service: General;  Laterality: Right;  . BREAST SURGERY  1990's   right- fibroid cyst  . CHOLECYSTECTOMY    . SEPTOPLASTY    . TONSILLECTOMY      I have reviewed the social history and family history with the patient and they are unchanged from previous note.  ALLERGIES:  is allergic to penicillins; prednisone; and radiaplexrx [woun'dres hydrogel wound dress].  MEDICATIONS:  Current Outpatient Medications  Medication Sig Dispense Refill  . ACCU-CHEK AVIVA PLUS test strip USE TO TEST TWICE DAILY **ICD-10 CODE E13.9** 200 each 3  . allopurinol (ZYLOPRIM) 300 MG tablet TAKE 1 TABLET (300 MG TOTAL) BY MOUTH DAILY. **DNF 04/06/15** 90 tablet 3  . apixaban (ELIQUIS)  5 MG TABS tablet Take 1 tablet (5 mg total) by mouth 2 (two) times daily. 180 tablet 3  . chlorpheniramine (CHLOR-TRIMETON) 4 MG tablet Take 4 mg 2 (two) times daily as needed by mouth for allergies.    Marland Kitchen diltiazem (CARDIZEM CD) 360 MG 24 hr capsule TAKE 1 CAPSULE (360 MG TOTAL) BY MOUTH DAILY. 90 capsule 3  . fish oil-omega-3 fatty acids 1000 MG capsule Take 1 g by mouth daily.    . metFORMIN (GLUCOPHAGE) 500 MG tablet Take 1 tablet (500 mg total) by mouth 2 (two) times daily. 180 tablet 3  . Multiple Vitamin (MULTIVITAMIN) tablet Take 1 tablet by mouth daily.    Marland Kitchen OVER THE COUNTER MEDICATION Take by mouth 2 (two) times daily. Presavision- for macular degeneration prevention    . VITAMIN D, ERGOCALCIFEROL, PO Take by mouth.    Marland Kitchen atenolol (TENORMIN) 25 MG tablet TAKE 1 TABLET (25 MG TOTAL) BY MOUTH DAILY. 30 tablet 10  . ondansetron (ZOFRAN) 8 MG tablet Take 1 tablet (8 mg total) by mouth 2 (two) times daily as needed (Nausea or vomiting). (Patient not taking: Reported on 12/10/2017) 30 tablet 1  . prochlorperazine (COMPAZINE) 10 MG tablet Take 1 tablet (10 mg  total) by mouth every 6 (six) hours as needed (Nausea or vomiting). (Patient not taking: Reported on 12/10/2017) 30 tablet 1   No current facility-administered medications for this visit.     PHYSICAL EXAMINATION: ECOG PERFORMANCE STATUS: 0 - Asymptomatic  Vitals:   12/10/17 1007  BP: (!) 138/91  Pulse: 90  Resp: 18  Temp: 98.1 F (36.7 C)  SpO2: 96%   Filed Weights   12/10/17 1007  Weight: 136 lb 12.8 oz (62.1 kg)    GENERAL:alert, no distress and comfortable SKIN: skin color, texture, turgor are normal. Small round bullae with surrounding erythema to left medial knee  EYES: sclera anicteric OROPHARYNX:no thrush or ulcers  LYMPH:  no palpable cervical, supraclavicular, or axillary lymphadenopathy LUNGS: clear to auscultation with normal breathing effort HEART: A.fib. No murmurs and no lower extremity edema ABDOMEN:abdomen soft, non-tender and normal bowel sounds Musculoskeletal:no cyanosis of digits and no clubbing  NEURO: alert & oriented x 3 with fluent speech, no focal motor/sensory deficits Breast: s/p right lumpectomy. Axillary and breast incisions have healed well. No erythema or discharge. There is firmness beneath the breast incision. No palpable mass that I could appreciate.   LABORATORY DATA:  I have reviewed the data as listed CBC Latest Ref Rng & Units 12/10/2017 12/03/2017 10/26/2017  WBC 3.9 - 10.3 K/uL 6.5 7.7 -  Hemoglobin 11.6 - 15.9 g/dL 13.5 14.0 15.3(H)  Hematocrit 34.8 - 46.6 % 41.3 42.8 45.0  Platelets 145 - 400 K/uL 215 195 -     CMP Latest Ref Rng & Units 12/10/2017 12/03/2017 10/26/2017  Glucose 70 - 99 mg/dL 137(H) 134(H) 182(H)  BUN 8 - 23 mg/dL 24(H) 23 29(H)  Creatinine 0.44 - 1.00 mg/dL 1.01(H) 1.18(H) 1.00  Sodium 135 - 145 mmol/L 139 139 139  Potassium 3.5 - 5.1 mmol/L 4.1 4.1 4.0  Chloride 98 - 111 mmol/L 106 105 104  CO2 22 - 32 mmol/L 21(L) 22 -  Calcium 8.9 - 10.3 mg/dL 9.4 9.4 -  Total Protein 6.5 - 8.1 g/dL 7.2 7.0 -  Total Bilirubin  0.3 - 1.2 mg/dL 0.6 0.6 -  Alkaline Phos 38 - 126 U/L 96 95 -  AST 15 - 41 U/L 23 18 -  ALT 0 - 44 U/L  28 21 -      RADIOGRAPHIC STUDIES: I have personally reviewed the radiological images as listed and agreed with the findings in the report. No results found.   ASSESSMENT & PLAN: 83 y.o. [place], [place] woman   1.  Breast cancer of upper outer quadrant of right breast, invasive ductal carcinoma, stage IB, pT1bN0M0, grade 2, Triple Negative, Ki67: 80% 2. pT1bN0, stage IA invasive ductal carcinoma of the left breast, grade I, ER 100%, PR 88%, Ki-67 16%, HER-2/neu negative. 3. Osteopenia  4. Hypercalcemia  5. DM, hyperglycemia  6. Social support  7. Insect bite, left medial knee   Ms. Kwolek is clinically doing well. She completed cycle 1 day 1 adjuvant abraxane 60 mg/m2. She tolerated very well without appreciable side effects. I recommend to increase her dose today to 70 mg/m2 due to her good performance status and tolerance. She is going on family trip tomorrow through 8/15, otherwise would have increased to full dose today. We reviewed symptom management. If she continues to tolerate well, will likely increase to full dose with day 15. Labs reviewed, CBC is normal. CMP stable. Proceed with cycle 1 day 8 abraxane 70 mg/m2 today. Will f/u in 1 week to monitor her tolerance.   For insect bite, I recommend symptom management with topical benadryl or hydrocortisone PRN. I reviewed if rash spreads or she becomes febrile while out of town to seek urgent care. She agrees.   PLAN: -Labs reviewed, proceed with cycle 1 day 8 abraxane 70 mg/m2 today -Return in 1 week for day 15, with plan to increase to full dose if she tolerates today's dose well  All questions were answered. The patient knows to call the clinic with any problems, questions or concerns. No barriers to learning was detected.     Alla Feeling, NP 12/10/17

## 2017-12-10 NOTE — Patient Instructions (Signed)
[facility] Discharge Instructions for Patients Receiving Chemotherapy   Today you received the following chemotherapy agents Abraxane.   To help prevent nausea and vomiting after your treatment, we encourage you to take your nausea medicationas prescribed.  If you develop nausea and vomiting that is not controlled by your nausea medication, call the clinic.   BELOW ARE SYMPTOMS THAT SHOULD BE REPORTED IMMEDIATELY:  *FEVER GREATER THAN 100.5 F  *CHILLS WITH OR WITHOUT FEVER  NAUSEA AND VOMITING THAT IS NOT CONTROLLED WITH YOUR NAUSEA MEDICATION  *UNUSUAL SHORTNESS OF BREATH  *UNUSUAL BRUISING OR BLEEDING  TENDERNESS IN MOUTH AND THROAT WITH OR WITHOUT PRESENCE OF ULCERS  *URINARY PROBLEMS  *BOWEL PROBLEMS  UNUSUAL RASH Items with * indicate a potential emergency and should be followed up as soon as possible.  Feel free to call the clinic should you have any questions or concerns. The clinic phone number is [phone].  Please show the CHEMO ALERT CARD at check-in to the Emergency Department and triage nurse.  

## 2017-12-10 NOTE — Telephone Encounter (Signed)
Removed from DAR/ working on scheduling appts per 8/9 los

## 2017-12-15 ENCOUNTER — Telehealth: Payer: Self-pay | Admitting: Hematology

## 2017-12-15 NOTE — Telephone Encounter (Signed)
ppts scheduled and patient notified. Copy mailed per 8/9 los

## 2017-12-15 NOTE — Progress Notes (Signed)
[facility] Follow-up office visit  Date of Service:  12/17/2017   ID: Adrienne [name] OB: 12/13/1934  MR#: [id number]  CSN#:[id number]  PCP: Nafziger, Cory, NP      CHIEF COMPLAINT: Follow up for right breast cancer, triple negative   Oncology History   Cancer Staging Breast cancer of upper-outer quadrant of right female breast (HCC) Staging form: Breast, AJCC 8th Edition - Clinical stage from 09/22/2017: Stage IB (cT1b, cN0, cM0, G2, ER-, PR-, HER2-) - Signed by Feng, Yan, MD on 10/08/2017  Breast cancer, left breast (HCC) Staging form: Breast, AJCC 7th Edition - Clinical stage from 09/12/2012: Stage IA (T1b, N0, M0) - Unsigned        Breast cancer, left breast (HCC)   08/25/2012 Receptors her2    ER 100% positive, PR 88% positive, HER-2 negative, Ki-67 16%    08/30/2012 Initial Diagnosis    Breast cancer, left breast    09/12/2012 Pathology Results    T1bN0 Grade 1 invasive ductal carcinoma, and DCIS.    09/12/2012 Surgery    Left breast lumpectomy and sentinel lymph node biopsy, negative margins.    11/09/2012 - 12/13/2012 Radiation Therapy    Adjuvant breast radiation    12/2012 - 11/2017 Anti-estrogen oral therapy    Anastrozole 1 mg daily, switched to Aromasin 25 mg daily in Jan 2015 due to diarrhea. Her Exemestane was switched to letrozole in 08/2017 due to high copay. She completed her 5 years in 11/2017.     09/01/2016 Imaging    US Outside films Breast form [facility] 09/01/2016 IMPRESSION: Probably Benign 1. No significant change in oval hypoechoic masses in the left breast at 11:00 2 cm from the nipple and 10:30 4 cm from the nipple. Both of these masses resemble the area of fat necrosis previously biopsies at 2:00. In addition, both masses have developing central benign or dystrophic appearing calcifications and are favored to be other areas of fat necrosis. A 6 month follow-up left breast mammogram and ultrasound is recommended.    09/01/2016 Mammogram    HM  Mammogram from [facility] 09/01/16 IMPRESSION  Incomplete - additional imaging evaluation needed Ultrasound of the upper medial left breast mass is recommended.     03/09/2017 Mammogram    Previous lumpectomy changes in the upper outer left breast anterior depth with stable associated dystrophic calcifications.  Biopsy clip at the 2:00 in the left breast near the lumpectomy site corresponding to previously biopsied benign fat necrosis.  Persistent round mass in the upper medial left breast with benign-appearing central round calcification.  No significant masses, calcifications, or other findings are seen in the breast  Impression: ultrasound is recommended for the left breast    03/09/2017 Breast US    Left breast ultrasound impression:  No significant change in oval hypoechoic masses in the left breast at 11:00 2 cm from the nipple and 10:30 4 cm from the nipple.  Both of these masses resemble the area of fat necrosis previously biopsied at 2:00.  In addition, both masses have developing central benign or dystrophic appearing calcifications and are favored to be other areas of fat necrosis.  A 6-month follow-up bilateral mammogram and left breast ultrasound is recommended.    09/14/2017 Breast US    Breast US Bilateral 09/14/17 at [facility]  IMPRESSION:  The 1 cm oval mass in the left breast at 9:30 posterior depth is highly suggestive of malignancy. An Ultrasound guided biopsy is recommended.  The 0.8 cm round mass in   the left breast at 11-12 o'clock anterior depth is suspicious of malignancy. An ultrasound guided biopsy is recommended.     09/22/2017 Pathology Results    Diagnosis 1. Breast, right, needle core biopsy - INVASIVE DUCTAL CARCINOMA, MSBR GRADE II/III. - DUCTAL CARCINOMA IN SITU WITH NECROSIS. 2. Breast, left, needle core biopsy - FAT NECROSIS. - NO EVIDENCE OF MALIGNANCY.    10/17/2017 Genetic Testing    Negative genetic testing on the Multicancer panel.  The Multi-Gene Panel  offered by [facility] includes sequencing and/or deletion duplication testing of the following 83 genes: ALK, APC, ATM, AXIN2,BAP1,  BARD1, BLM, BMPR1A, BRCA1, BRCA2, BRIP1, CASR, CDC73, CDH1, CDK4, CDKN1B, CDKN1C, CDKN2A (p14ARF), CDKN2A (p16INK4a), CEBPA, CHEK2, CTNNA1, DICER1, DIS3L2, EGFR (c.2369C>T, p.Thr790Met variant only), EPCAM (Deletion/duplication testing only), FH, FLCN, GATA2, GPC3, GREM1 (Promoter region deletion/duplication testing only), HOXB13 (c.251G>A, p.Gly84Glu), HRAS, KIT, MAX, MEN1, MET, MITF (c.952G>A, p.Glu318Lys variant only), MLH1, MSH2, MSH3, MSH6, MUTYH, NBN, NF1, NF2, NTHL1, PALB2, PDGFRA, PHOX2B, PMS2, POLD1, POLE, POT1, PRKAR1A, PTCH1, PTEN, RAD50, RAD51C, RAD51D, RB1, RECQL4, RET, RUNX1, SDHAF2, SDHA (sequence changes only), SDHB, SDHC, SDHD, SMAD4, SMARCA4, SMARCB1, SMARCE1, STK11, SUFU, TERT, TERT, TMEM127, TP53, TSC1, TSC2, VHL, WRN and WT1.  The report date is October 17, 2017.     Breast cancer of upper-outer quadrant of right female breast (HCC)   09/14/2017 Mammogram    Diagnostic mammogram at [facility]  IMPRESSION:  The new 0.9 cm oval high density mass in the right breast posterior depth superior region seen on the mediolateral oblique view only is indeterminate. The 1.1 cm focal asymmetry in the left breast central to the nipple anterior depth is indeterminate.     09/14/2017 Imaging    US Bilateral at [facility] IMPRESSION: The 1 cm oval mass in the right breast at 9:30 posterior depth is highly suggestive of malignancy. The 0.8 cm round mass in the left breast at 11-12 o'clock anterior depth is suspicious of malignancy.     09/22/2017 Cancer Staging    Staging form: Breast, AJCC 8th Edition - Clinical stage from 09/22/2017: Stage IB (cT1b, cN0, cM0, G2, ER-, PR-, HER2-) - Signed by Marcele Kosta, MD on 10/08/2017    09/22/2017 Pathology Results    Bilateral needle core biopsy 1. Breast, right, needle core biopsy - INVASIVE DUCTAL CARCINOMA, MSBR GRADE II/III. - DUCTAL  CARCINOMA IN SITU WITH NECROSIS. 2. Breast, left, needle core biopsy - FAT NECROSIS. - NO EVIDENCE OF MALIGNANCY.    09/25/2017 Receptors her2    Estrogen Receptor: 0 Progesterone 0 HER2: Negative. Ki-67: 80%     10/08/2017 Initial Diagnosis    Breast cancer of upper-outer quadrant of right female breast (HCC)    10/26/2017 Surgery    BREAST LUMPECTOMY WITH RADIOACTIVE SEED AND SENTINEL LYMPH NODE BIOPSY by Dr. Hoxworth  10/26/17    10/26/2017 Pathology Results    Diagnosis 10/26/17 1. Breast, lumpectomy, Right - INVASIVE DUCTAL CARCINOMA, GRADE 2, SPANNING 1 CM. - HIGH GRADE DUCTAL CARCINOMA IN SITU WITH NECROSIS. - FINAL RESECTION MARGINS (PARTS #2, 3, 4) ARE NEGATIVE. - BIOPSY SITE. - SEE ONCOLOGY TABLE. 2. Breast, excision, additional anterior margin- 2 pieces right - BENIGN BREAST TISSUE. 3. Breast, excision, Right unoriented anterior margin - BENIGN BREAST TISSUE. 4. Breast, excision, Right deep margin - BENIGN BREAST TISSUE. 5. Lymph node, sentinel, biopsy, Right Axillary - ONE OF ONE LYMPH NODES NEGATIVE FOR CARCINOMA (0/1).    10/26/2017 Cancer Staging    Staging form: Breast, AJCC 8th Edition - Pathologic stage   from 10/26/2017: Stage IB (pT1b, pN0, cM0, G2, ER-, PR-, HER2-) - Signed by Truitt Merle, MD on 11/12/2017    12/03/2017 -  Chemotherapy    Weekly Abraxane starting 12/03/17      CURRENT THERAPY:   -Anastrozole switched to Aromasin in 2015 due to diarrhea. Aromasin was switched to Letrozole in 08/2017 due to high copay. Plan to stop letrozole end of 11/2017 before chemo. -Adjuvant weekly Abraxane for 12 weeks starting on 12/03/17   INTERVAL HISTORY: Talyah returns for follow-up. She is here with her friend. She is doing well.  She tolerated her chemo cycles well.   She complains of bad mouth taste. She is trying to stay active and her energy levels are good. Her neuropathy is stable.  She reports easy bruising and would like to have a port placed.    PAST MEDICAL  HISTORY: Past Medical History:  Diagnosis Date  . Allergy   . Atrial fibrillation ([place])   . Breast cancer ([place]) 08/25/12   Invasive ductal ca,DCIS  . Diabetes mellitus    type II  . Family history of cancer   . Gout   . Hearing loss   . History of breast cancer   . History of frequent urinary tract infections   . Hx of radiation therapy 11/22/12- 12/19/12   breast 4250 cGy 17 sessions, left breast boost 750 cGy 3 sessions  . Hyperlipidemia   . Hypertension   . Osteopenia     PAST SURGICAL HISTORY: Past Surgical History:  Procedure Laterality Date  . ABDOMINAL HYSTERECTOMY Bilateral 1999   w/b/l salpingo-oopherectomy  . APPENDECTOMY    . BREAST LUMPECTOMY WITH NEEDLE LOCALIZATION AND AXILLARY SENTINEL LYMPH NODE BX Left 09/12/2012   Procedure: LEFT NEEDLE LOCALIZATION BREAST LUMPECTOMY TION AND AXILLARY SENTINEL LYMPH NODE BX;  Surgeon: Edward Jolly, MD;  Location: [facility];  Service: General;  Laterality: Left;  . BREAST LUMPECTOMY WITH RADIOACTIVE SEED AND SENTINEL LYMPH NODE BIOPSY Right 10/26/2017   Procedure: BREAST LUMPECTOMY WITH RADIOACTIVE SEED AND SENTINEL LYMPH NODE BIOPSY;  Surgeon: Excell Seltzer, MD;  Location[facility];  Service: General;  Laterality: Right;  . BREAST SURGERY  1990's   right- fibroid cyst  . CHOLECYSTECTOMY    . SEPTOPLASTY    . TONSILLECTOMY      FAMILY HISTORY Family History  Problem Relation Age of Onset  . Hypertension Mother   . Heart disease Mother        Murmur and irregular heart disease  . Dementia Mother        d. 17  . Hypertension Father   . Aneurysm Father 18  . Breast cancer Cousin 74       mat first cousin    GYNECOLOGIC HISTORY: menarche at age 64, g41 p3, s/p TAH/BSO in early 1s.  Was on HRT for several years (cannot recall the duration).  No h/o abnormal pap smears or sexually transmitted infections.    SOCIAL HISTORY: Lives in [place], [place] in a three story house, will move to [place]  soon.  Her husband lives at a nursing home due to dementia.  She is a retired Pharmacist, hospital.  Her son and daughter live in [place].      ADVANCED DIRECTIVES: Not in place.    HEALTH MAINTENANCE: Social History   Tobacco Use  . Smoking status: Former Smoker    Packs/day: 0.25    Years: 4.00    Pack years: 1.00    Types: Cigarettes  .  Smokeless tobacco: Never Used  . Tobacco comment: Cigarette use was 50 years ago  Substance Use Topics  . Alcohol use: Yes    Comment: Occ glass of wine with dinner  . Drug use: No    Mammogram: 09/14/2017  Colonoscopy: 2011 Bone Density Scan: 03/05/2015 osteopenia  Pap Smear: s/p tah/bso Eye Exam: due Vitamin D Level:  Normal, 04/14/13 Lipid Panel: unsure   Allergies  Allergen Reactions  . Penicillins Hives and Swelling  . Prednisone Other (See Comments)    ELEVATED BLOOD SUGAR  . Radiaplexrx [Woun'Dres Hydrogel Wound Dress] Hives    Current Outpatient Medications  Medication Sig Dispense Refill  . ACCU-CHEK AVIVA PLUS test strip USE TO TEST TWICE DAILY **ICD-10 CODE E13.9** 200 each 3  . allopurinol (ZYLOPRIM) 300 MG tablet TAKE 1 TABLET (300 MG TOTAL) BY MOUTH DAILY. **DNF 04/06/15** 90 tablet 3  . apixaban (ELIQUIS) 5 MG TABS tablet Take 1 tablet (5 mg total) by mouth 2 (two) times daily. 180 tablet 3  . chlorpheniramine (CHLOR-TRIMETON) 4 MG tablet Take 4 mg 2 (two) times daily as needed by mouth for allergies.    . diltiazem (CARDIZEM CD) 360 MG 24 hr capsule TAKE 1 CAPSULE (360 MG TOTAL) BY MOUTH DAILY. 90 capsule 3  . fish oil-omega-3 fatty acids 1000 MG capsule Take 1 g by mouth daily.    . metFORMIN (GLUCOPHAGE) 500 MG tablet Take 1 tablet (500 mg total) by mouth 2 (two) times daily. 180 tablet 3  . Multiple Vitamin (MULTIVITAMIN) tablet Take 1 tablet by mouth daily.    . OVER THE COUNTER MEDICATION Take by mouth 2 (two) times daily. Presavision- for macular degeneration prevention    . VITAMIN D, ERGOCALCIFEROL, PO Take by mouth.     . atenolol (TENORMIN) 25 MG tablet TAKE 1 TABLET (25 MG TOTAL) BY MOUTH DAILY. 30 tablet 10  . ondansetron (ZOFRAN) 8 MG tablet Take 1 tablet (8 mg total) by mouth 2 (two) times daily as needed (Nausea or vomiting). (Patient not taking: Reported on 12/10/2017) 30 tablet 1  . prochlorperazine (COMPAZINE) 10 MG tablet Take 1 tablet (10 mg total) by mouth every 6 (six) hours as needed (Nausea or vomiting). (Patient not taking: Reported on 12/10/2017) 30 tablet 1   No current facility-administered medications for this visit.    REVIEW OF SYSTEMS:   Constitutional: Denies fevers, chills or abnormal night sweats Eyes: Denies blurriness of vision, double vision or watery eyes Ears, nose, mouth, throat, and face: Denies mucositis or sore throat Respiratory: Denies cough, dyspnea or wheezes Cardiovascular: Denies palpitation, chest discomfort or lower extremity swelling Gastrointestinal:  Denies nausea, heartburn or change in bowel habits Skin: Denies abnormal skin rashes Lymphatics: Denies new lymphadenopathy or easy bruising Neurological:Denies numbness, tingling or new weaknesses Behavioral/Psych: Mood is stable, no new changes  BREAST: (+) right axilla incision irritation All other systems were reviewed with the patient and are negative.  OBJECTIVE:   Vitals:   12/17/17 1120  BP: 116/66  Pulse: 93  Resp: 18  Temp: (!) 97.5 F (36.4 C)  SpO2: 97%     Body mass index is 25.77 kg/m.     GENERAL: Patient is a well appearing female in no acute distress HEENT:  Sclerae anicteric.  Oropharynx clear and moist. No ulcerations or evidence of oropharyngeal candidiasis. Neck is supple.  NODES:  No cervical, supraclavicular, or axillary lymphadenopathy palpated.  LUNGS:  Clear to auscultation bilaterally.  No wheezes or rhonchi. HEART:  Regular rate and   rhythm. No murmur appreciated. ABDOMEN:  Soft, nontender.  Positive, normoactive bowel sounds. No organomegaly palpated. MSK:  No focal spinal  tenderness to palpation. Full range of motion bilaterally in the upper extremities. EXTREMITIES:  No peripheral edema.   SKIN:  Clear with no obvious rashes or skin changes. No nail dyscrasia. (+) bruises from blood withdrawal  NEURO:  Nonfocal. Well oriented.  Appropriate affect. Breasts: Breast inspection showed them to be symmetrical with no nipple discharge. (+) S/p Right Breast Lumpectomy: Surgical incision in right breast and axilla healing very well, no discharge. Palpation of the breasts and axilla revealed no obvious mass that I could appreciate.    ECOG FS:1 - Symptomatic but completely ambulatory  LAB RESULTS: CBC Latest Ref Rng & Units 12/17/2017 12/10/2017 12/03/2017  WBC 3.9 - 10.3 K/uL 2.7(L) 6.5 7.7  Hemoglobin 11.6 - 15.9 g/dL 12.7 13.5 14.0  Hematocrit 34.8 - 46.6 % 38.0 41.3 42.8  Platelets 145 - 400 K/uL 189 215 195    CMP Latest Ref Rng & Units 12/17/2017 12/10/2017 12/03/2017  Glucose 70 - 99 mg/dL 142(H) 137(H) 134(H)  BUN 8 - 23 mg/dL 21 24(H) 23  Creatinine 0.44 - 1.00 mg/dL 1.03(H) 1.01(H) 1.18(H)  Sodium 135 - 145 mmol/L 141 139 139  Potassium 3.5 - 5.1 mmol/L 3.5 4.1 4.1  Chloride 98 - 111 mmol/L 110 106 105  CO2 22 - 32 mmol/L 21(L) 21(L) 22  Calcium 8.9 - 10.3 mg/dL 8.7(L) 9.4 9.4  Total Protein 6.5 - 8.1 g/dL 6.7 7.2 7.0  Total Bilirubin 0.3 - 1.2 mg/dL 0.5 0.6 0.6  Alkaline Phos 38 - 126 U/L 89 96 95  AST 15 - 41 U/L _0 ALT 0 - 44 U/L 33 28 21   ANC 1.2K  PATHOLOGY REPORT  Diagnosis 10/26/17 1. Breast, lumpectomy, Right - INVASIVE DUCTAL CARCINOMA, GRADE 2, SPANNING 1 CM. - HIGH GRADE DUCTAL CARCINOMA IN SITU WITH NECROSIS. - FINAL RESECTION MARGINS (PARTS #2, 3, 4) ARE NEGATIVE. - BIOPSY SITE. - SEE ONCOLOGY TABLE. 2. Breast, excision, additional anterior margin- 2 pieces right - BENIGN BREAST TISSUE. 3. Breast, excision, Right unoriented anterior margin - BENIGN BREAST TISSUE. 4. Breast, excision, Right deep margin - BENIGN BREAST  TISSUE. 5. Lymph node, sentinel, biopsy, Right Axillary - ONE OF ONE LYMPH NODES NEGATIVE FOR CARCINOMA (0/1). Microscopic Comment 1. INVASIVE CARCINOMA OF THE BREAST: Resection Procedure: Right breast lumpectomy with additional anterior and posterior margin. Right axillary lymph node biopsy. Specimen Laterality: Right. Tumor Size: 1 cm. Histologic Type: Invasive ductal carcinoma. Histologic Grade: Glandular (Acinar)/Tubular Differentiation: 2 1 of 3 Amended copy Corrected FINAL for RENALDA, LOCKLIN ([id number]) Microscopic Comment(continued) Nuclear Pleomorphism: 2 Mitotic Rate: 2 Overall Grade: 2 Ductal Carcinoma In Situ: Present, high grade with necrosis. Tumor Extension: Confined to breast parenchyma. Margins: Distance from closest margin (millimeters): Originally <1 mm anterior, 1 mm posterior. Final anterior and posterior margins (parts 2, 3, 4) are negative. Specify closest margin (required only if <55m): Final margins >10 mm. DCIS Margins Distance from closest margin (millimeters): Original 2-3 mm anterior, 1 mm posterior. Final anterior and posterior margins (parts 2, 3, 4) are negative. Specify closest margin (required only if <191m: Final margins >10 mm Regional Lymph Nodes: Number of Lymph Nodes Examined: 1 Number of Sentinel Nodes Examined (if applicable): 1 Number of Lymph Nodes with Macrometastases (>2 mm): 0 Number of Lymph Nodes with Micrometastases: 0 Number of Lymph Nodes with Isolated Tumor Cells (0.2 mm or 200 cells)#: 0  Size of Largest Metastatic Deposit (millimeters): N/A. Extranodal Extension N/A. Treatment Effect: No known presurgical therapy Breast Biomarker Testing Performed on Previous Biopsy: Testing Performed on Case Number: [id number] Estrogen Receptor: 0 Progesterone 0 HER2: Negative. Ki-67: 80% Representative tumor block: 1A-C. Pathologic Stage Classification (pTNM, AJCC 8th Edition): pT1b, pN0 (v4.2.0.0)   Diagnosis,  09/22/2017 1. Breast, right, needle core biopsy - INVASIVE DUCTAL CARCINOMA, MSBR GRADE II/III. - DUCTAL CARCINOMA IN SITU WITH NECROSIS. 2. Breast, left, needle core biopsy - FAT NECROSIS. - NO EVIDENCE OF MALIGNANCY.   Diagnosis  08/28/2015 Breast, left, needle core biopsy - FAT NECROSIS. - THERE IS NO EVIDENCE OF MALIGNANCY. - SEE COMMENT.         STUDIES: I have requested her recent mammogram report from [facility].  DEXA scan, 09/20/2017  Osteopenia, very high risk of fracture, 29% for major osteoporotic fracture and 18% for hip fracture.   Breast Diagnostic 09/14/17 at [facility]  IMPRESSION:  The new 0.9 cm oval high density mass in the right breast posterior depth superior region seen on the mediolateral oblique view only is indeterminate. The 1.1 cm focal asymmetry in the left breast central to the nipple anterior depth is indeterminate.   US Bilateral, 09/14/2017 at [facility] IMPRESSION: The 1 cm oval mass in the right breast at 9:30 posterior depth is highly suggestive of malignancy. The 0.8 cm round mass in the left breast at 11-12 o'clock anterior depth is suspicious of malignancy.    US Outside Films Breast 03/09/17  IMPRESSION: 1. No significant change in oval masses in the left breast at the 11:00 cm from the nipple and 10:30 4 cm from the nipple.  Both of these masses resemble the area of fat necrosis previously biopsied at 2:00. In addition, both of these masses have developing central benign or dystrophic appearing calcifications and are favored to be other areas of fat necrosis.  A 6 month follow up bilateral mammogram and left breast US is recommended.  US Outside films Breast form [facility] 09/01/2016 IMPRESSION: Probably Benign 1. No significant change in oval hypoechoic masses in the left breast at 11:00 2 cm from the nipple and 10:30 4 cm from the nipple. Both of these masses resemble the area of fat necrosis previously biopsies at 2:00. In addition, both masses have  developing central benign or dystrophic appearing calcifications and are favored to be other areas of fat necrosis. A 6 month follow-up left breast mammogram and ultrasound is recommended.  HM Mammogram from [facility] 09/01/16 IMPRESSION  Incomplete - additional imaging evaluation needed Ultrasound of the upper medial left breast mass is recommended.   Left breast mammogram and ultrasound 02/27/2016 Impression: probably benign The 4 mm oval mass in the left breast 12:00 position likely represent fat necrosis, the 3 mm mass in the left breast upper inner quadrant middle depth most likely is fat necrosis, the 4 mm oval cyst in the left breast upper inner quadrant posterior depth is benign. Post surgical scar in the left breast upper outer quadrant anterior depth is benign.  ASSESSMENT:  82 y.o. [place], [place] woman   1.  Breast cancer of upper outer quadrant of right breast, invasive ductal carcinoma, stage IB, pT1bN0M0, grade 2, Triple Negative, Ki67: 80% -We previously discussed that her newly diagnosed right breast is different from her previous left breast cancer. It is triple negative and more aggressive. We previously discussed moderate to high risk of recurrence, and the role of adjuvant chemo. Due to her advanced age, she is not   a candidate for intensive chemotherapy. However she does have overall good health and performance status, she likely would be able to tolerate single agent Taxol or Abraxane if needed. -Her genetic testing was negative for 83 gene sequencing. -She previously underwent right breast lumpectomy with SLNB by Dr. Excell Seltzer on 10/26/17. I discussed her pathology which shows a 1cm tumor, G2, triple negative tumor was completely resection with negative margin, node negative. I discussed this is more aggressive than her previous cancer.  -I previously discussed there is a 20-30% risk of recurrence of triple negative breast cancer. Standard of care would include adjuvant chemotherapy.   She is a 83 year old, she lives independently, no significant comorbidities, likely would be able to tolerate low intensity chemo. Given her age, I do not recommend intensive chemotherapy such as AC-T or TC. Her options include adjuvant weekly taxol or Abraxane for 12 cycles or CMF every 3 weeks for 6 cycles. We also discussed surveillance alone without adjuvant chemotherapy. After lengthy discussion she expressed her wishes to try chemotherapy, and she opted weekly Abraxane. Due to her advanced age and diabetes, Abraxane would be a better option than Taxol. Side effects were discussed with patient in great detail and she gave chemo consent. -I previously discussed radiation will reduce her risk of local recurrence. She is interested. Will proceed after chemotherapy.  -the goal of therapy is curative  -Given IV therapy at her age, I previously discussed the option of PAC placement, PICC line placement or proceeding with peripheral infusion. She will try peripheral access first.  -she has started weekly abraxane, tolerated the first two weeks treatment very well -Labs reviewed, CBC showed WBC of 2.7K with ANC 1.2 and Cr at 1.03, Bg at 142, overall adequate to proceed with Abraxane today. I will keep her Abraxane at 56m/m2 due to neutropenia.  -I encouraged her to contact our clinic if she develops significant or unexpected side effects and present to the ED if she develops fever.  -f/u in 1 week with lacie   2. pT1bN0, stage IA invasive ductal carcinoma of the left breast, grade I, ER 100%, PR 88%, Ki-67 16%, HER-2/neu negative. -she previously underwent lumpectomy with sentinel lymph node biopsy on 09/12/2012, s/p radiation therapy from 11/09/2012 through 12/19/2012.  - Her left breast biopsy in 08/2015 showed fat necrosis. No clinical evidence of recurrence. -Her repeated mammogram in May 2018 showed 2 lesions in UIQ of left breast, stable, likely benign (fat necrosis per biopsy in 2017) -Left breast  mammogram and ultrasound on 03/09/2017 reveal hypoechoic masses that have developed central benign or dystrophic appearing calcifications and are favored to be areas of fat necrosis. -she was on adjuvant Aromasin, tolerating well -09/16/17 [place] behind her left breast scar and a mass In her right breast, both highly suggestive of malignancy.  -Her Exemestane was switched to letrozole in 08/2017 due to high copay. She is tolerating well and completed in 11/2017.   3. Osteopenia -She is on vitamin D supplement daily. -Given her worsening hypercalcemia, I told her to stop calcium  -Her recent bone density scan from 03/05/2015 showed osteopenia, with 10 year probability of major ostial parotic fracture 25%, and hip fracture 15%.  -Given the high risk of fracture, she would benefit from bisphosphonate or Prolia injection. I previously discussed the benefit and potential side effects, and written material of Prolia was given. She has not done Prolia injection yet and she is hesitant to due to the concern of side effects. I will  discuss the options of adjuvant Zometa after her chemotherapy.  -Patient most recent DEXA scan on 09/20/2017 showed: Osteopenia, very high risk of fracture, 29% for major osteoporotic fracture and 18% for hip fracture.    4. Hypercalcemia  -She knows to drink adequately and avoid dehydration  -She was previously instructed to restart Vit D in 03/2017 as well as continue light weight bearing exercise.  -She'll follow up with primary care physician for further workup.  -Calcium currently normalized   5. Diabetes and hyperglycemia  -She noticed hyperglycemia since she started steroids for her left hip pain. I previously suggested she stop taking her steroid medication as she no longer needs it.  -I previously encouraged her to f/u with PCP to see if she needs to change her metformin medication or dosage as her sugar has been higher lately.  -BG at 142 today   6. Social  Support -She lives at [facility] and is very independent -Her children live close by her    Plan -Lab reviewed and adequate to proceed with Abraxane at reduced dose 64m/m2 due to her mild neutropenia, continue weekly  -f/u in 1 week   I spent 15 minutes counseling the patient face to face.  The total time spent in the appointment was 20 minutes.  IDierdre SearlesDweik am acting as scribe for Dr. YTruitt Merle  I have reviewed the above documentation for accuracy and completeness, and I agree with the above.    YTruitt Merle  12/17/2017

## 2017-12-17 ENCOUNTER — Inpatient Hospital Stay: Payer: Medicare Other

## 2017-12-17 ENCOUNTER — Encounter: Payer: Self-pay | Admitting: Hematology

## 2017-12-17 ENCOUNTER — Inpatient Hospital Stay: Payer: Medicare Other | Admitting: Hematology

## 2017-12-17 VITALS — BP 116/66 | HR 93 | Temp 97.5°F | Resp 18 | Ht 61.0 in | Wt 136.4 lb

## 2017-12-17 DIAGNOSIS — C50912 Malignant neoplasm of unspecified site of left female breast: Secondary | ICD-10-CM

## 2017-12-17 DIAGNOSIS — C50412 Malignant neoplasm of upper-outer quadrant of left female breast: Secondary | ICD-10-CM

## 2017-12-17 DIAGNOSIS — D709 Neutropenia, unspecified: Secondary | ICD-10-CM

## 2017-12-17 DIAGNOSIS — Z171 Estrogen receptor negative status [ER-]: Secondary | ICD-10-CM | POA: Diagnosis not present

## 2017-12-17 DIAGNOSIS — Z170 Estrogen receptor positive status [ER+]: Secondary | ICD-10-CM

## 2017-12-17 DIAGNOSIS — E1165 Type 2 diabetes mellitus with hyperglycemia: Secondary | ICD-10-CM

## 2017-12-17 DIAGNOSIS — Z5111 Encounter for antineoplastic chemotherapy: Secondary | ICD-10-CM | POA: Diagnosis not present

## 2017-12-17 DIAGNOSIS — C50411 Malignant neoplasm of upper-outer quadrant of right female breast: Secondary | ICD-10-CM | POA: Diagnosis not present

## 2017-12-17 DIAGNOSIS — M8580 Other specified disorders of bone density and structure, unspecified site: Secondary | ICD-10-CM

## 2017-12-17 DIAGNOSIS — G629 Polyneuropathy, unspecified: Secondary | ICD-10-CM

## 2017-12-17 LAB — CMP (CANCER CENTER ONLY)
ALK PHOS: 89 U/L (ref 38–126)
ALT: 33 U/L (ref 0–44)
AST: 23 U/L (ref 15–41)
Albumin: 3.7 g/dL (ref 3.5–5.0)
Anion gap: 10 (ref 5–15)
BILIRUBIN TOTAL: 0.5 mg/dL (ref 0.3–1.2)
BUN: 21 mg/dL (ref 8–23)
CALCIUM: 8.7 mg/dL — AB (ref 8.9–10.3)
CHLORIDE: 110 mmol/L (ref 98–111)
CO2: 21 mmol/L — ABNORMAL LOW (ref 22–32)
CREATININE: 1.03 mg/dL — AB (ref 0.44–1.00)
GFR, EST AFRICAN AMERICAN: 58 mL/min — AB (ref >60)
GFR, EST NON AFRICAN AMERICAN: 50 mL/min — AB (ref >60)
Glucose, Bld: 142 mg/dL — ABNORMAL HIGH (ref 70–99)
Potassium: 3.5 mmol/L (ref 3.5–5.1)
Sodium: 141 mmol/L (ref 135–145)
TOTAL PROTEIN: 6.7 g/dL (ref 6.5–8.1)

## 2017-12-17 LAB — CBC WITH DIFFERENTIAL (CANCER CENTER ONLY)
BASOS PCT: 3 %
Basophils Absolute: 0.1 10*3/uL (ref 0.0–0.1)
EOS ABS: 0.1 10*3/uL (ref 0.0–0.5)
EOS PCT: 3 %
HCT: 38 % (ref 34.8–46.6)
Hemoglobin: 12.7 g/dL (ref 11.6–15.9)
LYMPHS ABS: 1.1 10*3/uL (ref 0.9–3.3)
Lymphocytes Relative: 41 %
MCH: 31.8 pg (ref 25.1–34.0)
MCHC: 33.4 g/dL (ref 31.5–36.0)
MCV: 95 fL (ref 79.5–101.0)
Monocytes Absolute: 0.2 10*3/uL (ref 0.1–0.9)
Monocytes Relative: 8 %
Neutro Abs: 1.2 10*3/uL — ABNORMAL LOW (ref 1.5–6.5)
Neutrophils Relative %: 45 %
PLATELETS: 189 10*3/uL (ref 145–400)
RBC: 4 MIL/uL (ref 3.70–5.45)
RDW: 13.6 % (ref 11.2–14.5)
WBC Count: 2.7 10*3/uL — ABNORMAL LOW (ref 3.9–10.3)

## 2017-12-17 MED ORDER — SODIUM CHLORIDE 0.9 % IV SOLN
Freq: Once | INTRAVENOUS | Status: AC
  Administered 2017-12-17: 13:00:00 via INTRAVENOUS
  Filled 2017-12-17: qty 250

## 2017-12-17 MED ORDER — PROCHLORPERAZINE MALEATE 10 MG PO TABS
10.0000 mg | ORAL_TABLET | Freq: Once | ORAL | Status: AC
  Administered 2017-12-17: 10 mg via ORAL

## 2017-12-17 MED ORDER — PACLITAXEL PROTEIN-BOUND CHEMO INJECTION 100 MG
70.0000 mg/m2 | Freq: Once | INTRAVENOUS | Status: AC
  Administered 2017-12-17: 125 mg via INTRAVENOUS
  Filled 2017-12-17: qty 25

## 2017-12-17 MED ORDER — PROCHLORPERAZINE MALEATE 10 MG PO TABS
ORAL_TABLET | ORAL | Status: AC
  Filled 2017-12-17: qty 1

## 2017-12-17 NOTE — Patient Instructions (Signed)
[facility] Discharge Instructions for Patients Receiving Chemotherapy  Today you received the following chemotherapy agents:  Abraxane.  To help prevent nausea and vomiting after your treatment, we encourage you to take your nausea medication as directed.   If you develop nausea and vomiting that is not controlled by your nausea medication, call the clinic.   BELOW ARE SYMPTOMS THAT SHOULD BE REPORTED IMMEDIATELY:  *FEVER GREATER THAN 100.5 F  *CHILLS WITH OR WITHOUT FEVER  NAUSEA AND VOMITING THAT IS NOT CONTROLLED WITH YOUR NAUSEA MEDICATION  *UNUSUAL SHORTNESS OF BREATH  *UNUSUAL BRUISING OR BLEEDING  TENDERNESS IN MOUTH AND THROAT WITH OR WITHOUT PRESENCE OF ULCERS  *URINARY PROBLEMS  *BOWEL PROBLEMS  UNUSUAL RASH Items with * indicate a potential emergency and should be followed up as soon as possible.  Feel free to call the clinic should you have any questions or concerns. The clinic phone number is [phone].  Please show the CHEMO ALERT CARD at check-in to the Emergency Department and triage nurse.   

## 2017-12-20 ENCOUNTER — Telehealth: Payer: Self-pay | Admitting: Hematology

## 2017-12-20 NOTE — Telephone Encounter (Signed)
No LOS 8/19

## 2017-12-21 ENCOUNTER — Telehealth: Payer: Self-pay | Admitting: *Deleted

## 2017-12-21 ENCOUNTER — Inpatient Hospital Stay (HOSPITAL_BASED_OUTPATIENT_CLINIC_OR_DEPARTMENT_OTHER): Payer: Medicare Other | Admitting: Medical

## 2017-12-21 ENCOUNTER — Inpatient Hospital Stay: Payer: Medicare Other

## 2017-12-21 ENCOUNTER — Other Ambulatory Visit: Payer: Self-pay

## 2017-12-21 ENCOUNTER — Telehealth: Payer: Self-pay | Admitting: Medical

## 2017-12-21 VITALS — BP 129/74 | HR 86 | Temp 97.9°F | Resp 18 | Ht 61.0 in | Wt 135.6 lb

## 2017-12-21 DIAGNOSIS — Z171 Estrogen receptor negative status [ER-]: Secondary | ICD-10-CM

## 2017-12-21 DIAGNOSIS — R5383 Other fatigue: Secondary | ICD-10-CM | POA: Diagnosis not present

## 2017-12-21 DIAGNOSIS — C50412 Malignant neoplasm of upper-outer quadrant of left female breast: Secondary | ICD-10-CM

## 2017-12-21 DIAGNOSIS — L989 Disorder of the skin and subcutaneous tissue, unspecified: Secondary | ICD-10-CM

## 2017-12-21 DIAGNOSIS — C50411 Malignant neoplasm of upper-outer quadrant of right female breast: Secondary | ICD-10-CM

## 2017-12-21 DIAGNOSIS — Z170 Estrogen receptor positive status [ER+]: Principal | ICD-10-CM

## 2017-12-21 DIAGNOSIS — Z5111 Encounter for antineoplastic chemotherapy: Secondary | ICD-10-CM | POA: Diagnosis not present

## 2017-12-21 DIAGNOSIS — L270 Generalized skin eruption due to drugs and medicaments taken internally: Secondary | ICD-10-CM

## 2017-12-21 LAB — CMP (CANCER CENTER ONLY)
ALT: 22 U/L (ref 0–44)
AST: 17 U/L (ref 15–41)
Albumin: 3.6 g/dL (ref 3.5–5.0)
Alkaline Phosphatase: 91 U/L (ref 38–126)
Anion gap: 9 (ref 5–15)
BILIRUBIN TOTAL: 0.6 mg/dL (ref 0.3–1.2)
BUN: 17 mg/dL (ref 8–23)
CO2: 24 mmol/L (ref 22–32)
CREATININE: 1.05 mg/dL — AB (ref 0.44–1.00)
Calcium: 9.1 mg/dL (ref 8.9–10.3)
Chloride: 105 mmol/L (ref 98–111)
GFR, EST NON AFRICAN AMERICAN: 49 mL/min — AB (ref >60)
GFR, Est AFR Am: 57 mL/min — ABNORMAL LOW (ref >60)
GLUCOSE: 196 mg/dL — AB (ref 70–99)
Potassium: 3.9 mmol/L (ref 3.5–5.1)
Sodium: 138 mmol/L (ref 135–145)
TOTAL PROTEIN: 6.5 g/dL (ref 6.5–8.1)

## 2017-12-21 LAB — CBC WITH DIFFERENTIAL (CANCER CENTER ONLY)
Basophils Absolute: 0 10*3/uL (ref 0.0–0.1)
Basophils Relative: 2 %
EOS ABS: 0 10*3/uL (ref 0.0–0.5)
EOS PCT: 2 %
HCT: 37 % (ref 34.8–46.6)
Hemoglobin: 12.2 g/dL (ref 11.6–15.9)
Lymphocytes Relative: 26 %
Lymphs Abs: 0.6 10*3/uL — ABNORMAL LOW (ref 0.9–3.3)
MCH: 31.3 pg (ref 25.1–34.0)
MCHC: 33 g/dL (ref 31.5–36.0)
MCV: 94.9 fL (ref 79.5–101.0)
MONO ABS: 0.1 10*3/uL (ref 0.1–0.9)
Monocytes Relative: 3 %
Neutro Abs: 1.7 10*3/uL (ref 1.5–6.5)
Neutrophils Relative %: 67 %
PLATELETS: 175 10*3/uL (ref 145–400)
RBC: 3.9 MIL/uL (ref 3.70–5.45)
RDW: 13.8 % (ref 11.2–14.5)
WBC: 2.4 10*3/uL — AB (ref 3.9–10.3)

## 2017-12-21 MED ORDER — TRIAMCINOLONE ACETONIDE 0.1 % EX LOTN: 1.0000 "application " | TOPICAL_LOTION | Freq: Three times a day (TID) | CUTANEOUS | 1 refills | Status: DC

## 2017-12-21 NOTE — Telephone Encounter (Signed)
SPOKE WITH PATIENT HAVING HER COME INTO BE EVALUATED IN [facility] labs 10:30, Sandi Mealy 11:00

## 2017-12-21 NOTE — Telephone Encounter (Signed)
Call received from [name] in reference to "Received third chemotherapy (Abraxane) Friday.  Sunday night I noticed red spots on both arms.  The right arm is worse.  No pain, itching or fever.  Tiny dots, the larger ones smaller than dime size.  I also have blown nose with yellow phlegm, small amount blood.  Drips blood I don;t even know until someone tells me.  I don't know if I need to be seen it's just my arms look funny."  Call transferred to collaborative to help with this.

## 2017-12-21 NOTE — Patient Instructions (Signed)

## 2017-12-21 NOTE — Telephone Encounter (Signed)
Patient scheduled per 8/20 sch message. Patient aware of d&t.

## 2017-12-22 NOTE — Progress Notes (Signed)
[facility] Progress Note   Adrienne [name] [id number] 03-Apr-1935 83 y.o.  Adrienne [name] is managed by Dr. Truitt Merle  Actively treated with chemotherapy/immunotherapy: yes  Current Therapy: Abraxane   Last Treated: 12/17/2017 (cycle 1, day 15)  Assessment: Plan:    Drug rash - Plan: triamcinolone lotion (KENALOG) 0.1 %  Malignant neoplasm of upper-outer quadrant of right breast in female, estrogen receptor negative ([place])   Drug last suspected secondary to Abraxane: The patient was given a prescription for triamcinolone lotion 0.1% to use 3 times daily.  This was discussed with Dr. Burr Medico.  The patient has been told to contact our office should her rash not respond to therapy.  Originally Dr. Truitt Merle had suggested that the patient may need to be on a steroid taper should she not respond to this therapy however the patient is diabetic and reports that her blood sugar had increased to the mid 300s when she had previously been on steroids.  ER negative malignant neoplasm of the right breast: The patient is status post cycle 1, day 15 of Abraxane which was dosed on 12/17/2017.  Please see After Visit Summary for patient specific instructions.  Future Appointments  Date Time Provider [place]  12/24/2017 11:00 AM [facility] LAB 6 [facility] None  12/24/2017 11:30 AM Alla Feeling, NP [facility] None  12/24/2017 12:15 PM [facility] INFUSION [facility] None  12/31/2017 10:00 AM [facility] LAB 4 [facility] None  12/31/2017 10:30 AM Alla Feeling, NP [facility] None  12/31/2017 11:00 AM [facility] INFUSION [facility] None  01/07/2018 11:00 AM [facility] LAB 5 [facility] None  01/07/2018 11:30 AM Truitt Merle, MD [facility] None  01/07/2018  1:00 PM [facility] INFUSION [facility] None  01/14/2018 10:30 AM [facility] LAB 2 [facility] None  01/14/2018 11:00 AM Alla Feeling, NP [facility] None  01/14/2018 12:00 PM [facility] INFUSION [facility] None    01/21/2018 11:00 AM [facility] LAB 3 [facility] None  01/21/2018 11:30 AM Cira Rue K, NP [facility] None  01/21/2018  1:00 PM [facility] INFUSION [facility] None  03/23/2018 10:00 AM [facility] LAB 4 [facility] None  03/23/2018 10:30 AM Truitt Merle, MD [facility] None    No orders of the defined types were placed in this encounter.     Subjective:   Patient ID:  Adrienne [name] is a 83 y.o. (DOB 1935/03/28) female.  Chief Complaint:  Chief Complaint  Patient presents with  . Rash    HPI Adrienne [name] is an 83 year old female with a history of an ER negative malignant neoplasm of the right breast who is managed by Dr. Truitt Merle and is status post cycle 1, day 15 of Abraxane which was dosed on 12/17/2017.  The patient presents to the office today with a report of generalized fatigue.  She also has noted flat mildly irregular erythematous and non-pruritic lesions over her upper extremities bilaterally.  She has had some diarrhea recently which was mildly dark.  She also reports having a nosebleed on Saturday while visiting her husband who is a resident of a memory care unit at a long-term care facility.  She continues on Eliquis for A. fib.  She denies chest pain, shortness of breath, nausea, vomiting, headache, dizziness, or weakness.  Medications: I have reviewed the patient's current medications.  Allergies:  Allergies  Allergen Reactions  . Penicillins Hives and Swelling  . Prednisone Other (See Comments)    ELEVATED BLOOD SUGAR  . Radiaplexrx [Woun'Dres Hydrogel Wound Dress] Hives    Past  Medical History:  Diagnosis Date  . Allergy   . Atrial fibrillation ([place])   . Breast cancer ([place]) 08/25/12   Invasive ductal ca,DCIS  . Diabetes mellitus    type II  . Family history of cancer   . Gout   . Hearing loss   . History of breast cancer   . History of frequent urinary tract infections   . Hx of radiation therapy 11/22/12- 12/19/12   breast 4250 cGy 17 sessions,  left breast boost 750 cGy 3 sessions  . Hyperlipidemia   . Hypertension   . Osteopenia     Past Surgical History:  Procedure Laterality Date  . ABDOMINAL HYSTERECTOMY Bilateral 1999   w/b/l salpingo-oopherectomy  . APPENDECTOMY    . BREAST LUMPECTOMY WITH NEEDLE LOCALIZATION AND AXILLARY SENTINEL LYMPH NODE BX Left 09/12/2012   Procedure: LEFT NEEDLE LOCALIZATION BREAST LUMPECTOMY TION AND AXILLARY SENTINEL LYMPH NODE BX;  Surgeon: Edward Jolly, MD;  Location: [facility];  Service: General;  Laterality: Left;  . BREAST LUMPECTOMY WITH RADIOACTIVE SEED AND SENTINEL LYMPH NODE BIOPSY Right 10/26/2017   Procedure: BREAST LUMPECTOMY WITH RADIOACTIVE SEED AND SENTINEL LYMPH NODE BIOPSY;  Surgeon: Excell Seltzer, MD;  Location: [facility];  Service: General;  Laterality: Right;  . BREAST SURGERY  1990's   right- fibroid cyst  . CHOLECYSTECTOMY    . SEPTOPLASTY    . TONSILLECTOMY      Family History  Problem Relation Age of Onset  . Hypertension Mother   . Heart disease Mother        Murmur and irregular heart disease  . Dementia Mother        d. 68  . Hypertension Father   . Aneurysm Father 98  . Breast cancer Cousin 42       mat first cousin    Social History   Socioeconomic History  . Marital status: Married    Spouse name: Not on file  . Number of children: 3  . Years of education: Not on file  . Highest education level: Not on file  Occupational History  . Not on file  Social Needs  . Financial resource strain: Not on file  . Food insecurity:    Worry: Not on file    Inability: Not on file  . Transportation needs:    Medical: Not on file    Non-medical: Not on file  Tobacco Use  . Smoking status: Former Smoker    Packs/day: 0.25    Years: 4.00    Pack years: 1.00    Types: Cigarettes  . Smokeless tobacco: Never Used  . Tobacco comment: Cigarette use was 50 years ago  Substance and Sexual Activity  . Alcohol use: Yes    Comment: Occ glass  of wine with dinner  . Drug use: No  . Sexual activity: Never    Comment: menarche age 12, 61st birth age 61, G46, HRT x 7 years?  Lifestyle  . Physical activity:    Days per week: Not on file    Minutes per session: Not on file  . Stress: Not on file  Relationships  . Social connections:    Talks on phone: Not on file    Gets together: Not on file    Attends religious service: Not on file    Active member of club or organization: Not on file    Attends meetings of clubs or organizations: Not on file    Relationship status: Not on file  .  Intimate partner violence:    Fear of current or ex partner: Not on file    Emotionally abused: Not on file    Physically abused: Not on file    Forced sexual activity: Not on file  Other Topics Concern  . Not on file  Social History Narrative   Married - husband in memory care unit    Three children ( One lives at [place] and two in [place]      She likes to read and go to [employer]     Past Medical History, Surgical history, Social history, and Family history were reviewed and updated as appropriate.   Please see review of systems for further details on the patient's review from today.   Review of Systems:  Review of Systems  Constitutional: Negative for chills, diaphoresis and fever.  HENT: Positive for nosebleeds. Negative for facial swelling and trouble swallowing.   Respiratory: Negative for cough, chest tightness and shortness of breath.   Cardiovascular: Negative for chest pain.  Gastrointestinal: Positive for diarrhea. Negative for anal bleeding, blood in stool, constipation, nausea and vomiting.  Skin: Positive for rash.    Objective:   Physical Exam:  BP 129/74 (BP Location: Left Arm, Patient Position: Sitting)   Pulse 86   Temp 97.9 F (36.6 C) (Oral)   Resp 18   Ht 5\' 1"  (1.549 m)   Wt 135 lb 9.6 oz (61.5 kg)   SpO2 98%   BMI 25.62 kg/m  ECOG: 0  Physical Exam  Constitutional: No distress.  HENT:  Head:  Normocephalic and atraumatic.  Cardiovascular: Normal rate, regular rhythm and normal heart sounds. Exam reveals no gallop and no friction rub.  No murmur heard. Pulmonary/Chest: Effort normal and breath sounds normal. No respiratory distress. She has no wheezes. She has no rales.  Neurological: She is alert. Coordination normal.  Skin: Skin is warm and dry. Rash (Flat mildly irregular erythematous lesions of varying sizes over her upper extremities bilaterally.) noted. She is not diaphoretic. No erythema.    Lab Review:     Component Value Date/Time   NA 138 12/21/2017 1040   NA 143 06/24/2017 1002   NA 139 03/22/2017 0958   K 3.9 12/21/2017 1040   K 4.4 03/22/2017 0958   CL 105 12/21/2017 1040   CL 104 09/20/2012 1302   CO2 24 12/21/2017 1040   CO2 25 03/22/2017 0958   GLUCOSE 196 (H) 12/21/2017 1040   GLUCOSE 140 03/22/2017 0958   GLUCOSE 108 (H) 09/20/2012 1302   BUN 17 12/21/2017 1040   BUN 21 06/24/2017 1002   BUN 23.8 03/22/2017 0958   CREATININE 1.05 (H) 12/21/2017 1040   CREATININE 1.2 (H) 03/22/2017 0958   CALCIUM 9.1 12/21/2017 1040   CALCIUM 10.1 03/22/2017 0958   PROT 6.5 12/21/2017 1040   PROT 7.0 03/22/2017 0958   ALBUMIN 3.6 12/21/2017 1040   ALBUMIN 3.9 03/22/2017 0958   AST 17 12/21/2017 1040   AST 19 03/22/2017 0958   ALT 22 12/21/2017 1040   ALT 24 03/22/2017 0958   ALKPHOS 91 12/21/2017 1040   ALKPHOS 84 03/22/2017 0958   BILITOT 0.6 12/21/2017 1040   BILITOT 0.44 03/22/2017 0958   GFRNONAA 49 (L) 12/21/2017 1040   GFRAA 57 (L) 12/21/2017 1040       Component Value Date/Time   WBC 2.4 (L) 12/21/2017 1040   WBC 7.3 09/20/2017 0920   RBC 3.90 12/21/2017 1040   HGB 12.2 12/21/2017 1040   HGB 15.0  06/24/2017 1002   HGB 14.6 03/22/2017 0958   HCT 37.0 12/21/2017 1040   HCT 43.6 06/24/2017 1002   HCT 44.0 03/22/2017 0958   PLT 175 12/21/2017 1040   PLT 210 06/24/2017 1002   MCV 94.9 12/21/2017 1040   MCV 94 06/24/2017 1002   MCV 95.6  03/22/2017 0958   MCH 31.3 12/21/2017 1040   MCHC 33.0 12/21/2017 1040   RDW 13.8 12/21/2017 1040   RDW 14.0 06/24/2017 1002   RDW 14.5 03/22/2017 0958   LYMPHSABS 0.6 (L) 12/21/2017 1040   LYMPHSABS 1.4 03/22/2017 0958   MONOABS 0.1 12/21/2017 1040   MONOABS 0.8 03/22/2017 0958   EOSABS 0.0 12/21/2017 1040   EOSABS 0.1 03/22/2017 0958   BASOSABS 0.0 12/21/2017 1040   BASOSABS 0.1 03/22/2017 0958   -------------------------------  Imaging from last 24 hours (if applicable):  Radiology interpretation: No results found.      This case was discussed with Dr. Burr Medico. She expressed agreement with my management of this patient.

## 2017-12-24 ENCOUNTER — Inpatient Hospital Stay: Payer: Medicare Other | Admitting: Nurse Practitioner

## 2017-12-24 ENCOUNTER — Inpatient Hospital Stay: Payer: Medicare Other

## 2017-12-24 ENCOUNTER — Telehealth: Payer: Self-pay | Admitting: Nurse Practitioner

## 2017-12-24 ENCOUNTER — Encounter: Payer: Self-pay | Admitting: Nurse Practitioner

## 2017-12-24 VITALS — BP 126/66 | HR 92 | Temp 97.5°F | Resp 18 | Ht 61.0 in | Wt 135.6 lb

## 2017-12-24 DIAGNOSIS — R21 Rash and other nonspecific skin eruption: Secondary | ICD-10-CM

## 2017-12-24 DIAGNOSIS — Z5111 Encounter for antineoplastic chemotherapy: Secondary | ICD-10-CM | POA: Diagnosis not present

## 2017-12-24 DIAGNOSIS — Z171 Estrogen receptor negative status [ER-]: Secondary | ICD-10-CM | POA: Diagnosis not present

## 2017-12-24 DIAGNOSIS — Z170 Estrogen receptor positive status [ER+]: Principal | ICD-10-CM

## 2017-12-24 DIAGNOSIS — C50411 Malignant neoplasm of upper-outer quadrant of right female breast: Secondary | ICD-10-CM | POA: Diagnosis not present

## 2017-12-24 DIAGNOSIS — M8580 Other specified disorders of bone density and structure, unspecified site: Secondary | ICD-10-CM

## 2017-12-24 DIAGNOSIS — C50412 Malignant neoplasm of upper-outer quadrant of left female breast: Secondary | ICD-10-CM

## 2017-12-24 DIAGNOSIS — D701 Agranulocytosis secondary to cancer chemotherapy: Secondary | ICD-10-CM

## 2017-12-24 DIAGNOSIS — R5383 Other fatigue: Secondary | ICD-10-CM

## 2017-12-24 DIAGNOSIS — T451X5A Adverse effect of antineoplastic and immunosuppressive drugs, initial encounter: Secondary | ICD-10-CM

## 2017-12-24 DIAGNOSIS — E1165 Type 2 diabetes mellitus with hyperglycemia: Secondary | ICD-10-CM

## 2017-12-24 LAB — CBC WITH DIFFERENTIAL (CANCER CENTER ONLY)
BASOS ABS: 0.1 10*3/uL (ref 0.0–0.1)
BASOS PCT: 3 %
Eosinophils Absolute: 0 10*3/uL (ref 0.0–0.5)
Eosinophils Relative: 2 %
HCT: 36.8 % (ref 34.8–46.6)
Hemoglobin: 12.1 g/dL (ref 11.6–15.9)
LYMPHS PCT: 36 %
Lymphs Abs: 0.9 10*3/uL (ref 0.9–3.3)
MCH: 31.6 pg (ref 25.1–34.0)
MCHC: 33 g/dL (ref 31.5–36.0)
MCV: 95.7 fL (ref 79.5–101.0)
Monocytes Absolute: 0.2 10*3/uL (ref 0.1–0.9)
Monocytes Relative: 10 %
NEUTROS ABS: 1.2 10*3/uL — AB (ref 1.5–6.5)
Neutrophils Relative %: 49 %
PLATELETS: 193 10*3/uL (ref 145–400)
RBC: 3.84 MIL/uL (ref 3.70–5.45)
RDW: 14.1 % (ref 11.2–14.5)
WBC: 2.4 10*3/uL — AB (ref 3.9–10.3)

## 2017-12-24 LAB — CMP (CANCER CENTER ONLY)
ALK PHOS: 95 U/L (ref 38–126)
ALT: 23 U/L (ref 0–44)
ANION GAP: 8 (ref 5–15)
AST: 21 U/L (ref 15–41)
Albumin: 3.6 g/dL (ref 3.5–5.0)
BILIRUBIN TOTAL: 0.3 mg/dL (ref 0.3–1.2)
BUN: 26 mg/dL — ABNORMAL HIGH (ref 8–23)
CO2: 24 mmol/L (ref 22–32)
CREATININE: 1.08 mg/dL — AB (ref 0.44–1.00)
Calcium: 9.2 mg/dL (ref 8.9–10.3)
Chloride: 108 mmol/L (ref 98–111)
GFR, EST AFRICAN AMERICAN: 55 mL/min — AB (ref >60)
GFR, Estimated: 47 mL/min — ABNORMAL LOW (ref >60)
Glucose, Bld: 136 mg/dL — ABNORMAL HIGH (ref 70–99)
Potassium: 3.8 mmol/L (ref 3.5–5.1)
SODIUM: 140 mmol/L (ref 135–145)
TOTAL PROTEIN: 6.7 g/dL (ref 6.5–8.1)

## 2017-12-24 MED ORDER — SODIUM CHLORIDE 0.9 % IV SOLN
Freq: Once | INTRAVENOUS | Status: AC
  Administered 2017-12-24: 12:00:00 via INTRAVENOUS
  Filled 2017-12-24: qty 250

## 2017-12-24 MED ORDER — PROCHLORPERAZINE MALEATE 10 MG PO TABS
10.0000 mg | ORAL_TABLET | Freq: Once | ORAL | Status: AC
  Administered 2017-12-24: 10 mg via ORAL

## 2017-12-24 MED ORDER — PACLITAXEL PROTEIN-BOUND CHEMO INJECTION 100 MG
70.0000 mg/m2 | Freq: Once | INTRAVENOUS | Status: AC
  Administered 2017-12-24: 125 mg via INTRAVENOUS
  Filled 2017-12-24: qty 25

## 2017-12-24 MED ORDER — PROCHLORPERAZINE MALEATE 10 MG PO TABS
ORAL_TABLET | ORAL | Status: AC
  Filled 2017-12-24: qty 1

## 2017-12-24 NOTE — Progress Notes (Signed)
[place]  [phone] [phone]  Clinic Follow up Note   Patient Care Team: [name], NP as PCP - General (Family Medicine) Lelon Perla, MD as PCP - Cardiology (Cardiology) 12/24/2017  SUMMARY OF ONCOLOGIC HISTORY: Oncology History   Cancer Staging Breast cancer of upper-outer quadrant of right female breast [facility]) Staging form: Breast, AJCC 8th Edition - Clinical stage from 09/22/2017: Stage IB (cT1b, cN0, cM0, G2, ER-, PR-, HER2-) - Signed by Truitt Merle, MD on 10/08/2017  Breast cancer, left breast [facility]) Staging form: Breast, AJCC 7th Edition - Clinical stage from 09/12/2012: Stage IA (T1b, N0, M0) - Unsigned        Breast cancer, left breast ([place])   08/25/2012 Receptors her2    ER 100% positive, PR 88% positive, HER-2 negative, Ki-67 16%    08/30/2012 Initial Diagnosis    Breast cancer, left breast    09/12/2012 Pathology Results    T1bN0 Grade 1 invasive ductal carcinoma, and DCIS.    09/12/2012 Surgery    Left breast lumpectomy and sentinel lymph node biopsy, negative margins.    11/09/2012 - 12/13/2012 Radiation Therapy    Adjuvant breast radiation    12/2012 - 11/2017 Anti-estrogen oral therapy    Anastrozole 1 mg daily, switched to Aromasin 25 mg daily in Jan 2015 due to diarrhea. Her Exemestane was switched to letrozole in 08/2017 due to high copay. She completed her 5 years in 11/2017.     09/01/2016 Imaging    [name] Outside films Breast form [facility] 09/01/2016 IMPRESSION: Probably Benign 1. No significant change in oval hypoechoic masses in the left breast at 11:00 2 cm from the nipple and 10:30 4 cm from the nipple. Both of these masses resemble the area of fat necrosis previously biopsies at 2:00. In addition, both masses have developing central benign or dystrophic appearing calcifications and are favored to be other areas of fat necrosis. A 6 month follow-up left breast mammogram and ultrasound is recommended.    09/01/2016  Mammogram    [facility] Mammogram from [facility] 09/01/16 IMPRESSION  Incomplete - additional imaging evaluation needed Ultrasound of the upper medial left breast mass is recommended.     03/09/2017 Mammogram    Previous lumpectomy changes in the upper outer left breast anterior depth with stable associated dystrophic calcifications.  Biopsy clip at the 2:00 in the left breast near the lumpectomy site corresponding to previously biopsied benign fat necrosis.  Persistent round mass in the upper medial left breast with benign-appearing central round calcification.  No significant masses, calcifications, or other findings are seen in the breast  Impression: ultrasound is recommended for the left breast    03/09/2017 Breast US    Left breast ultrasound impression:  No significant change in oval hypoechoic masses in the left breast at 11:00 2 cm from the nipple and 10:30 4 cm from the nipple.  Both of these masses resemble the area of fat necrosis previously biopsied at 2:00.  In addition, both masses have developing central benign or dystrophic appearing calcifications and are favored to be other areas of fat necrosis.  A 61-monthfollow-up bilateral mammogram and left breast ultrasound is recommended.    09/14/2017 Breast [place]   Breast [place] 09[place]14[place]17 at [facility]  IMPRESSION:  The 1 cm oval mass in the left breast at 9:30 posterior depth is highly suggestive of malignancy. An Ultrasound guided biopsy is recommended.  The 0.8 cm round mass in the left breast at 11-12  o'clock anterior depth is suspicious of malignancy. An ultrasound guided biopsy is recommended.     09/22/2017 Pathology Results    Diagnosis 1. Breast, right, needle core biopsy - INVASIVE DUCTAL CARCINOMA, MSBR GRADE II/III. - DUCTAL CARCINOMA IN SITU WITH NECROSIS. 2. Breast, left, needle core biopsy - FAT NECROSIS. - NO EVIDENCE OF MALIGNANCY.    10/17/2017 Genetic Testing    Negative genetic testing on the Multicancer panel.  The  Multi-Gene Panel offered by [facility] includes sequencing and/or deletion duplication testing of the following 83 genes: ALK, APC, ATM, AXIN2,BAP1,  BARD1, BLM, BMPR1A, BRCA1, BRCA2, BRIP1, CASR, CDC73, CDH1, CDK4, CDKN1B, CDKN1C, CDKN2A (p14ARF), CDKN2A (p16INK4a), CEBPA, CHEK2, CTNNA1, DICER1, DIS3L2, EGFR (c.2369C>T, p.Thr790Met variant only), EPCAM (Deletion/duplication testing only), FH, FLCN, GATA2, GPC3, GREM1 (Promoter region deletion/duplication testing only), HOXB13 (c.251G>A, p.Gly84Glu), HRAS, KIT, MAX, MEN1, MET, MITF (c.952G>A, p.Glu318Lys variant only), MLH1, MSH2, MSH3, MSH6, MUTYH, NBN, NF1, NF2, NTHL1, PALB2, PDGFRA, PHOX2B, PMS2, POLD1, POLE, POT1, PRKAR1A, PTCH1, PTEN, RAD50, RAD51C, RAD51D, RB1, RECQL4, RET, RUNX1, SDHAF2, SDHA (sequence changes only), SDHB, SDHC, SDHD, SMAD4, SMARCA4, SMARCB1, SMARCE1, STK11, SUFU, TERT, TERT, TMEM127, TP53, TSC1, TSC2, VHL, WRN and WT1.  The report date is October 17, 2017.     Breast cancer of upper-outer quadrant of right female breast ([place])   09/14/2017 Mammogram    Diagnostic mammogram at [facility]  IMPRESSION:  The new 0.9 cm oval high density mass in the right breast posterior depth superior region seen on the mediolateral oblique view only is indeterminate. The 1.1 cm focal asymmetry in the left breast central to the nipple anterior depth is indeterminate.     09/14/2017 Imaging    [name] Bilateral at [facility] IMPRESSION: The 1 cm oval mass in the right breast at 9:30 posterior depth is highly suggestive of malignancy. The 0.8 cm round mass in the left breast at 11-12 o'clock anterior depth is suspicious of malignancy.     09/22/2017 Cancer Staging    Staging form: Breast, AJCC 8th Edition - Clinical stage from 09/22/2017: Stage IB (cT1b, cN0, cM0, G2, ER-, PR-, HER2-) - Signed by Truitt Merle, MD on 10/08/2017    09/22/2017 Pathology Results    Bilateral needle core biopsy 1. Breast, right, needle core biopsy - INVASIVE DUCTAL CARCINOMA, MSBR GRADE  II/III. - DUCTAL CARCINOMA IN SITU WITH NECROSIS. 2. Breast, left, needle core biopsy - FAT NECROSIS. - NO EVIDENCE OF MALIGNANCY.    09/25/2017 Receptors her2    Estrogen Receptor: 0 Progesterone 0 HER2: Negative. Ki-67: 80%     10/08/2017 Initial Diagnosis    Breast cancer of upper-outer quadrant of right female breast ([place])    10/26/2017 Surgery    BREAST LUMPECTOMY WITH RADIOACTIVE SEED AND SENTINEL LYMPH NODE BIOPSY by Dr. Excell Seltzer  10/26/17    10/26/2017 Pathology Results    Diagnosis 10/26/17 1. Breast, lumpectomy, Right - INVASIVE DUCTAL CARCINOMA, GRADE 2, SPANNING 1 CM. - HIGH GRADE DUCTAL CARCINOMA IN SITU WITH NECROSIS. - FINAL RESECTION MARGINS (PARTS #2, 3, 4) ARE NEGATIVE. - BIOPSY SITE. - SEE ONCOLOGY TABLE. 2. Breast, excision, additional anterior margin- 2 pieces right - BENIGN BREAST TISSUE. 3. Breast, excision, Right unoriented anterior margin - BENIGN BREAST TISSUE. 4. Breast, excision, Right deep margin - BENIGN BREAST TISSUE. 5. Lymph node, sentinel, biopsy, Right Axillary - ONE OF ONE LYMPH NODES NEGATIVE FOR CARCINOMA (0/1).    10/26/2017 Cancer Staging    Staging form: Breast, AJCC 8th Edition - Pathologic stage from 10/26/2017: Stage IB (pT1b,  pN0, cM0, G2, ER-, PR-, HER2-) - Signed by Truitt Merle, MD on 11/12/2017    12/03/2017 -  Chemotherapy    Weekly Abraxane starting 12/03/17   CURRENT THERAPY:   -Anastrozole switched to Aromasin in 2015 due to diarrhea. Aromasin was switched to Letrozole in 08/2017 due to high copay. Plan to stop letrozole end of 11/2017 before chemo. -Adjuvant weekly Abraxane for 12 weeks starting on 12/03/17  INTERVAL HISTORY: Adrienne [name] returns for follow up and 4th dose abraxane as scheduled. She completed cycle 3 on 12/17/17. She developed non-pruritic rash on her forearms and came to [facility] to see Sandi Mealy, PA on 8/20. She was prescribed kenalog TID but had difficulty getting it, she has only used 3 doses so far. No new rash, but also  no improvement yet. She has more fatigue with this cycle, but remains able to performs ADLs. Appetite is normal. She had 2 episodes of diarrhea 2 days ago that resolved with imodium. No n/v/c. She has nasal sputum and 1 brief nosebleed. On eliquis. Denies fever, chills, sore throat, cough, chest pain, or dyspnea. She continues to exercise. Denies neuropathy. She is getting PAC on Monday, will stop anticoagulant over the weekend as instructed.    MEDICAL HISTORY:  Past Medical History:  Diagnosis Date  . Allergy   . Atrial fibrillation ([place])   . Breast cancer ([place]) 08/25/12   Invasive ductal ca,DCIS  . Diabetes mellitus    type II  . Family history of cancer   . Gout   . Hearing loss   . History of breast cancer   . History of frequent urinary tract infections   . Hx of radiation therapy 11/22/12- 12/19/12   breast 4250 cGy 17 sessions, left breast boost 750 cGy 3 sessions  . Hyperlipidemia   . Hypertension   . Osteopenia     SURGICAL HISTORY: Past Surgical History:  Procedure Laterality Date  . ABDOMINAL HYSTERECTOMY Bilateral 1999   w/b/l salpingo-oopherectomy  . APPENDECTOMY    . BREAST LUMPECTOMY WITH NEEDLE LOCALIZATION AND AXILLARY SENTINEL LYMPH NODE BX Left 09/12/2012   Procedure: LEFT NEEDLE LOCALIZATION BREAST LUMPECTOMY TION AND AXILLARY SENTINEL LYMPH NODE BX;  Surgeon: Edward Jolly, MD;  Location: [place];  Service: General;  Laterality: Left;  . BREAST LUMPECTOMY WITH RADIOACTIVE SEED AND SENTINEL LYMPH NODE BIOPSY Right 10/26/2017   Procedure: BREAST LUMPECTOMY WITH RADIOACTIVE SEED AND SENTINEL LYMPH NODE BIOPSY;  Surgeon: Excell Seltzer, MD;  Location: [place];  Service: General;  Laterality: Right;  . BREAST SURGERY  1990's   right- fibroid cyst  . CHOLECYSTECTOMY    . SEPTOPLASTY    . TONSILLECTOMY      I have reviewed the social history and family history with the patient and they are unchanged from previous note.  ALLERGIES:  is  allergic to penicillins; prednisone; and radiaplexrx [woun'dres hydrogel wound dress].  MEDICATIONS:  Current Outpatient Medications  Medication Sig Dispense Refill  . ACCU-CHEK AVIVA PLUS test strip USE TO TEST TWICE DAILY **ICD-10 CODE E13.9** 200 each 3  . allopurinol (ZYLOPRIM) 300 MG tablet TAKE 1 TABLET (300 MG TOTAL) BY MOUTH DAILY. **DNF 04/06/15** 90 tablet 3  . apixaban (ELIQUIS) 5 MG TABS tablet Take 1 tablet (5 mg total) by mouth 2 (two) times daily. 180 tablet 3  . chlorpheniramine (CHLOR-TRIMETON) 4 MG tablet Take 4 mg 2 (two) times daily as needed by mouth for allergies.    Marland Kitchen diltiazem (CARDIZEM CD) 360 MG 24  hr capsule TAKE 1 CAPSULE (360 MG TOTAL) BY MOUTH DAILY. 90 capsule 3  . fish oil-omega-3 fatty acids 1000 MG capsule Take 1 g by mouth daily.    . metFORMIN (GLUCOPHAGE) 500 MG tablet Take 1 tablet (500 mg total) by mouth 2 (two) times daily. 180 tablet 3  . Multiple Vitamin (MULTIVITAMIN) tablet Take 1 tablet by mouth daily.    . ondansetron (ZOFRAN) 8 MG tablet Take 1 tablet (8 mg total) by mouth 2 (two) times daily as needed (Nausea or vomiting). 30 tablet 1  . OVER THE COUNTER MEDICATION Take by mouth 2 (two) times daily. Presavision- for macular degeneration prevention    . prochlorperazine (COMPAZINE) 10 MG tablet Take 1 tablet (10 mg total) by mouth every 6 (six) hours as needed (Nausea or vomiting). 30 tablet 1  . triamcinolone lotion (KENALOG) 0.1 % Apply 1 application topically 3 (three) times daily. 60 mL 1  . VITAMIN D, ERGOCALCIFEROL, PO Take by mouth.    Marland Kitchen atenolol (TENORMIN) 25 MG tablet TAKE 1 TABLET (25 MG TOTAL) BY MOUTH DAILY. 30 tablet 10   No current facility-administered medications for this visit.    Facility-Administered Medications Ordered in Other Visits  Medication Dose Route Frequency Provider Last Rate Last Dose  . PACLitaxel-protein bound (ABRAXANE) chemo infusion 125 mg  70 mg/m2 (Treatment Plan Recorded) Intravenous Once Truitt Merle, MD          PHYSICAL EXAMINATION: ECOG PERFORMANCE STATUS: 1 - Symptomatic but completely ambulatory  Vitals:   12/24/17 1142 12/24/17 1258  BP: 126/66   Pulse: 92   Resp: 18   Temp: (!) 97.5 F (36.4 C)   SpO2: 93% 97%   Repeat O2 sat 97% during today's exam   Filed Weights   12/24/17 1142  Weight: 135 lb 9.6 oz (61.5 kg)    GENERAL:alert, no distress and comfortable SKIN: scattered flat irregular erythematous rash to forearms bilaterally  EYES: sclera clear OROPHARYNX:no thrush or ulcers   LYMPH:  no palpable cervical or supraclavicular lymphadenopathy LUNGS: clear to auscultation with normal breathing effort HEART: irregular rhythm, Afib; no lower extremity edema ABDOMEN:abdomen soft, non-tender and normal bowel sounds Musculoskeletal:no cyanosis of digits and no clubbing  NEURO: alert & oriented x 3 with fluent speech, no focal motor/sensory deficits PAC without erythema   LABORATORY DATA:  I have reviewed the data as listed CBC Latest Ref Rng & Units 12/24/2017 12/21/2017 12/17/2017  WBC 3.9 - 10.3 K/uL 2.4(L) 2.4(L) 2.7(L)  Hemoglobin 11.6 - 15.9 g/dL 12.1 12.2 12.7  Hematocrit 34.8 - 46.6 % 36.8 37.0 38.0  Platelets 145 - 400 K/uL 193 175 189     CMP Latest Ref Rng & Units 12/24/2017 12/21/2017 12/17/2017  Glucose 70 - 99 mg/dL 136(H) 196(H) 142(H)  BUN 8 - 23 mg/dL 26(H) 17 21  Creatinine 0.44 - 1.00 mg/dL 1.08(H) 1.05(H) 1.03(H)  Sodium 135 - 145 mmol/L 140 138 141  Potassium 3.5 - 5.1 mmol/L 3.8 3.9 3.5  Chloride 98 - 111 mmol/L 108 105 110  CO2 22 - 32 mmol/L 24 24 21(L)  Calcium 8.9 - 10.3 mg/dL 9.2 9.1 8.7(L)  Total Protein 6.5 - 8.1 g/dL 6.7 6.5 6.7  Total Bilirubin 0.3 - 1.2 mg/dL 0.3 0.6 0.5  Alkaline Phos 38 - 126 U/L 95 91 89  AST 15 - 41 U/L _0 ALT 0 - 44 U/L 23 22 33      RADIOGRAPHIC STUDIES: I have personally reviewed the radiological images  as listed and agreed with the findings in the report. No results found.   ASSESSMENT & PLAN: 83  y.o. Adrienne [name], Adrienne [name]   1.  Breast cancer of upper outer quadrant of right breast, invasive ductal carcinoma, stage IB, pT1bN0M0, grade 2, Triple Negative, Ki67: 80% 2. pT1bN0, stage IA invasive ductal carcinoma of the left breast, grade I, ER 100%, PR 88%, Ki-67 16%, HER-2/neu negative 3. Osteopenia, on vitamin D; DEXA scan on 09/20/2017 showed: Osteopenia, very high risk of fracture, 29% for major osteoporotic fracture and 18% for hip fracture 4. Hypercalcemia 5. DM 6. Social support  7. Chemotherapy induced neutropenia  8. Skin rash, suspected secondary to abraxane   Ms. Boline appears well. She completed 3rd weekly adjuvant abraxane. She is tolerating well overall, mild mild skin rash and fatigue. She is applying kenalog, no new eruptions. Will monitor closely. For fatigue she continues to exercise. Labs reviewed, she has persistent but stable neutropenia, ANC 1.2. We reviewed neutropenic precautions. Will proceed with 4th weekly dose abraxane 70 mg/m2 today. Do not plan to increase dose due to neutropenia. She will return in 1 week for follow up and next cycle. She knows to call with new/worsening rash, fever, chills, productive cough, dysuria, or uncontrolled diarrhea.   She is getting port placed 12/27/17, she will hold eliquis after today. Will add flush appts to her schedule.   PLAN: -Labs reviewed, ANC 1.2 - proceed with cycle 2 day 1 (4th dose) weekly abraxane  -Reviewed neutropenic precautions -PAC at surgical center 8/26, will add flush appts to her schedule  -Return for f/u and next cycle in 1 week   All questions were answered. The patient knows to call the clinic with any problems, questions or concerns. No barriers to learning was detected. I spent 20 minutes counseling the patient face to face. The total time spent in the appointment was 25 minutes and more than 50% was on counseling and review of test results     Alla Feeling, NP 12/24/17

## 2017-12-24 NOTE — Patient Instructions (Signed)
[facility] Discharge Instructions for Patients Receiving Chemotherapy  Today you received the following chemotherapy agents:  Abraxane.  To help prevent nausea and vomiting after your treatment, we encourage you to take your nausea medication as directed.   If you develop nausea and vomiting that is not controlled by your nausea medication, call the clinic.   BELOW ARE SYMPTOMS THAT SHOULD BE REPORTED IMMEDIATELY:  *FEVER GREATER THAN 100.5 F  *CHILLS WITH OR WITHOUT FEVER  NAUSEA AND VOMITING THAT IS NOT CONTROLLED WITH YOUR NAUSEA MEDICATION  *UNUSUAL SHORTNESS OF BREATH  *UNUSUAL BRUISING OR BLEEDING  TENDERNESS IN MOUTH AND THROAT WITH OR WITHOUT PRESENCE OF ULCERS  *URINARY PROBLEMS  *BOWEL PROBLEMS  UNUSUAL RASH Items with * indicate a potential emergency and should be followed up as soon as possible.  Feel free to call the clinic should you have any questions or concerns. The clinic phone number is [phone].  Please show the CHEMO ALERT CARD at check-in to the Emergency Department and triage nurse.   

## 2017-12-24 NOTE — Progress Notes (Signed)
Per Regan Rakers, NP okay to treat with ANC of 1.2

## 2017-12-24 NOTE — Telephone Encounter (Signed)
Scheduled appt per 8/23 los - gave patient AVS and calender per los.  

## 2017-12-31 ENCOUNTER — Inpatient Hospital Stay: Payer: Medicare Other | Admitting: Nurse Practitioner

## 2017-12-31 ENCOUNTER — Inpatient Hospital Stay: Payer: Medicare Other

## 2017-12-31 ENCOUNTER — Telehealth: Payer: Self-pay | Admitting: Hematology

## 2017-12-31 ENCOUNTER — Other Ambulatory Visit: Payer: Self-pay | Admitting: Nurse Practitioner

## 2017-12-31 ENCOUNTER — Encounter: Payer: Self-pay | Admitting: Nurse Practitioner

## 2017-12-31 VITALS — BP 134/69 | HR 87 | Temp 97.8°F | Resp 18 | Ht 61.0 in | Wt 136.2 lb

## 2017-12-31 DIAGNOSIS — M8580 Other specified disorders of bone density and structure, unspecified site: Secondary | ICD-10-CM | POA: Diagnosis not present

## 2017-12-31 DIAGNOSIS — C50411 Malignant neoplasm of upper-outer quadrant of right female breast: Secondary | ICD-10-CM

## 2017-12-31 DIAGNOSIS — Z5111 Encounter for antineoplastic chemotherapy: Secondary | ICD-10-CM | POA: Diagnosis not present

## 2017-12-31 DIAGNOSIS — Z171 Estrogen receptor negative status [ER-]: Secondary | ICD-10-CM | POA: Diagnosis not present

## 2017-12-31 DIAGNOSIS — R5383 Other fatigue: Secondary | ICD-10-CM

## 2017-12-31 DIAGNOSIS — R21 Rash and other nonspecific skin eruption: Secondary | ICD-10-CM

## 2017-12-31 DIAGNOSIS — Z95828 Presence of other vascular implants and grafts: Secondary | ICD-10-CM

## 2017-12-31 DIAGNOSIS — E119 Type 2 diabetes mellitus without complications: Secondary | ICD-10-CM | POA: Diagnosis not present

## 2017-12-31 DIAGNOSIS — K1231 Oral mucositis (ulcerative) due to antineoplastic therapy: Secondary | ICD-10-CM

## 2017-12-31 DIAGNOSIS — C50412 Malignant neoplasm of upper-outer quadrant of left female breast: Secondary | ICD-10-CM

## 2017-12-31 DIAGNOSIS — R05 Cough: Secondary | ICD-10-CM

## 2017-12-31 DIAGNOSIS — Z170 Estrogen receptor positive status [ER+]: Principal | ICD-10-CM

## 2017-12-31 LAB — CMP (CANCER CENTER ONLY)
ALBUMIN: 3.3 g/dL — AB (ref 3.5–5.0)
ALT: 66 U/L — AB (ref 0–44)
ANION GAP: 12 (ref 5–15)
AST: 72 U/L — AB (ref 15–41)
Alkaline Phosphatase: 94 U/L (ref 38–126)
BUN: 16 mg/dL (ref 8–23)
CO2: 25 mmol/L (ref 22–32)
CREATININE: 1.01 mg/dL — AB (ref 0.44–1.00)
Calcium: 10 mg/dL (ref 8.9–10.3)
Chloride: 105 mmol/L (ref 98–111)
GFR, Est AFR Am: 59 mL/min — ABNORMAL LOW (ref >60)
GFR, Estimated: 51 mL/min — ABNORMAL LOW (ref >60)
GLUCOSE: 157 mg/dL — AB (ref 70–99)
Potassium: 3.4 mmol/L — ABNORMAL LOW (ref 3.5–5.1)
SODIUM: 142 mmol/L (ref 135–145)
Total Bilirubin: 0.4 mg/dL (ref 0.3–1.2)
Total Protein: 6.8 g/dL (ref 6.5–8.1)

## 2017-12-31 LAB — CBC WITH DIFFERENTIAL (CANCER CENTER ONLY)
Basophils Absolute: 0 10*3/uL (ref 0.0–0.1)
Basophils Relative: 1 %
EOS ABS: 0.1 10*3/uL (ref 0.0–0.5)
Eosinophils Relative: 1 %
HEMATOCRIT: 33.4 % — AB (ref 34.8–46.6)
HEMOGLOBIN: 11.1 g/dL — AB (ref 11.6–15.9)
LYMPHS ABS: 1.1 10*3/uL (ref 0.9–3.3)
Lymphocytes Relative: 28 %
MCH: 31.5 pg (ref 25.1–34.0)
MCHC: 33.2 g/dL (ref 31.5–36.0)
MCV: 94.9 fL (ref 79.5–101.0)
MONO ABS: 0.3 10*3/uL (ref 0.1–0.9)
MONOS PCT: 7 %
NEUTROS PCT: 63 %
Neutro Abs: 2.6 10*3/uL (ref 1.5–6.5)
Platelet Count: 188 10*3/uL (ref 145–400)
RBC: 3.52 MIL/uL — ABNORMAL LOW (ref 3.70–5.45)
RDW: 13.9 % (ref 11.2–14.5)
WBC Count: 4.1 10*3/uL (ref 3.9–10.3)

## 2017-12-31 MED ORDER — SODIUM CHLORIDE 0.9 % IV SOLN
Freq: Once | INTRAVENOUS | Status: AC
  Administered 2017-12-31: 13:00:00 via INTRAVENOUS
  Filled 2017-12-31: qty 250

## 2017-12-31 MED ORDER — MAGIC MOUTHWASH W/LIDOCAINE: 5.0000 mL | Freq: Three times a day (TID) | ORAL | 0 refills | Status: DC

## 2017-12-31 MED ORDER — PROCHLORPERAZINE MALEATE 10 MG PO TABS
10.0000 mg | ORAL_TABLET | Freq: Once | ORAL | Status: AC
  Administered 2017-12-31: 10 mg via ORAL

## 2017-12-31 MED ORDER — PROCHLORPERAZINE MALEATE 10 MG PO TABS
ORAL_TABLET | ORAL | Status: AC
  Filled 2017-12-31: qty 1

## 2017-12-31 MED ORDER — PACLITAXEL PROTEIN-BOUND CHEMO INJECTION 100 MG
70.0000 mg/m2 | Freq: Once | INTRAVENOUS | Status: AC
  Administered 2017-12-31: 125 mg via INTRAVENOUS
  Filled 2017-12-31: qty 25

## 2017-12-31 MED ORDER — SODIUM CHLORIDE 0.9% FLUSH
10.0000 mL | INTRAVENOUS | Status: DC | PRN
  Filled 2017-12-31: qty 10

## 2017-12-31 MED ORDER — SODIUM CHLORIDE 0.9% FLUSH
10.0000 mL | INTRAVENOUS | Status: DC | PRN
  Administered 2017-12-31: 10 mL
  Filled 2017-12-31: qty 10

## 2017-12-31 MED ORDER — HEPARIN SOD (PORK) LOCK FLUSH 100 UNIT/ML IV SOLN
500.0000 [IU] | Freq: Once | INTRAVENOUS | Status: AC | PRN
  Administered 2017-12-31: 500 [IU]
  Filled 2017-12-31: qty 5

## 2017-12-31 MED ORDER — PACLITAXEL PROTEIN-BOUND CHEMO INJECTION 100 MG
70.0000 mg/m2 | Freq: Once | Status: DC
  Filled 2017-12-31: qty 25

## 2017-12-31 NOTE — Patient Instructions (Addendum)
[facility] Discharge Instructions for Patients Receiving Chemotherapy  Today you received the following chemotherapy agents Abraxane  To help prevent nausea and vomiting after your treatment, we encourage you to take your nausea medication as directed.    If you develop nausea and vomiting that is not controlled by your nausea medication, call the clinic.   BELOW ARE SYMPTOMS THAT SHOULD BE REPORTED IMMEDIATELY:  *FEVER GREATER THAN 100.5 F  *CHILLS WITH OR WITHOUT FEVER  NAUSEA AND VOMITING THAT IS NOT CONTROLLED WITH YOUR NAUSEA MEDICATION  *UNUSUAL SHORTNESS OF BREATH  *UNUSUAL BRUISING OR BLEEDING  TENDERNESS IN MOUTH AND THROAT WITH OR WITHOUT PRESENCE OF ULCERS  *URINARY PROBLEMS  *BOWEL PROBLEMS  UNUSUAL RASH Items with * indicate a potential emergency and should be followed up as soon as possible.  Feel free to call the clinic should you have any questions or concerns. The clinic phone number is [phone].  Please show the [facility] at check-in to the Emergency Department and triage nurse.   Nanoparticle Albumin-Bound Paclitaxel injection What is this medicine? NANOPARTICLE ALBUMIN-BOUND PACLITAXEL (Na no PAHR ti kuhl al BYOO muhn-bound PAK li TAX el) is a chemotherapy drug. It targets fast dividing cells, like cancer cells, and causes these cells to die. This medicine is used to treat advanced breast cancer and advanced lung cancer. This medicine may be used for other purposes; ask your health care provider or pharmacist if you have questions. COMMON BRAND NAME(S): Abraxane What should I tell my health care provider before I take this medicine? They need to know if you have any of these conditions: -kidney disease -liver disease -low blood counts, like low platelets, red blood cells, or white blood cells -recent or ongoing radiation therapy -an unusual or allergic reaction to paclitaxel, albumin, other chemotherapy, other medicines,  foods, dyes, or preservatives -pregnant or trying to get pregnant -breast-feeding How should I use this medicine? This drug is given as an infusion into a vein. It is administered in a hospital or clinic by a specially trained health care professional. Talk to your pediatrician regarding the use of this medicine in children. Special care may be needed. Overdosage: If you think you have taken too much of this medicine contact a poison control center or emergency room at once. NOTE: This medicine is only for you. Do not share this medicine with others. What if I miss a dose? It is important not to miss your dose. Call your doctor or health care professional if you are unable to keep an appointment. What may interact with this medicine? -cyclosporine -diazepam -ketoconazole -medicines to increase blood counts like filgrastim, pegfilgrastim, sargramostim -other chemotherapy drugs like cisplatin, doxorubicin, epirubicin, etoposide, teniposide, vincristine -quinidine -testosterone -vaccines -verapamil Talk to your doctor or health care professional before taking any of these medicines: -acetaminophen -aspirin -ibuprofen -ketoprofen -naproxen This list may not describe all possible interactions. Give your health care provider a list of all the medicines, herbs, non-prescription drugs, or dietary supplements you use. Also tell them if you smoke, drink alcohol, or use illegal drugs. Some items may interact with your medicine. What should I watch for while using this medicine? Your condition will be monitored carefully while you are receiving this medicine. You will need important blood work done while you are taking this medicine. This medicine can cause serious allergic reactions. If you experience allergic reactions like skin rash, itching or hives, swelling of the face, lips, or tongue, tell your doctor or health  care professional right away. In some cases, you may be given additional  medicines to help with side effects. Follow all directions for their use. This drug may make you feel generally unwell. This is not uncommon, as chemotherapy can affect healthy cells as well as cancer cells. Report any side effects. Continue your course of treatment even though you feel ill unless your doctor tells you to stop. Call your doctor or health care professional for advice if you get a fever, chills or sore throat, or other symptoms of a cold or flu. Do not treat yourself. This drug decreases your body's ability to fight infections. Try to avoid being around people who are sick. This medicine may increase your risk to bruise or bleed. Call your doctor or health care professional if you notice any unusual bleeding. Be careful brushing and flossing your teeth or using a toothpick because you may get an infection or bleed more easily. If you have any dental work done, tell your dentist you are receiving this medicine. Avoid taking products that contain aspirin, acetaminophen, ibuprofen, naproxen, or ketoprofen unless instructed by your doctor. These medicines may hide a fever. Do not become pregnant while taking this medicine. Women should inform their doctor if they wish to become pregnant or think they might be pregnant. There is a potential for serious side effects to an unborn child. Talk to your health care professional or pharmacist for more information. Do not breast-feed an infant while taking this medicine. Men are advised not to father a child while receiving this medicine. What side effects may I notice from receiving this medicine? Side effects that you should report to your doctor or health care professional as soon as possible: -allergic reactions like skin rash, itching or hives, swelling of the face, lips, or tongue -low blood counts - This drug may decrease the number of white blood cells, red blood cells and platelets. You may be at increased risk for infections and  bleeding. -signs of infection - fever or chills, cough, sore throat, pain or difficulty passing urine -signs of decreased platelets or bleeding - bruising, pinpoint red spots on the skin, black, tarry stools, nosebleeds -signs of decreased red blood cells - unusually weak or tired, fainting spells, lightheadedness -breathing problems -changes in vision -chest pain -high or low blood pressure -mouth sores -nausea and vomiting -pain, swelling, redness or irritation at the injection site -pain, tingling, numbness in the hands or feet -slow or irregular heartbeat -swelling of the ankle, feet, hands Side effects that usually do not require medical attention (report to your doctor or health care professional if they continue or are bothersome): -aches, pains -changes in the color of fingernails -diarrhea -hair loss -loss of appetite This list may not describe all possible side effects. Call your doctor for medical advice about side effects. You may report side effects to FDA at 1-800-FDA-1088. Where should I keep my medicine? This drug is given in a hospital or clinic and will not be stored at home. NOTE: This sheet is a summary. It may not cover all possible information. If you have questions about this medicine, talk to your doctor, pharmacist, or health care provider.  2018 Elsevier/Gold Standard (2015-02-20 10:05:20)   Potassium Content of Foods Potassium is a mineral found in many foods and drinks. It helps keep fluids and minerals balanced in your body and affects how steadily your heart beats. Potassium also helps control your blood pressure and keep your muscles and nervous system healthy. Certain  health conditions and medicines may change the balance of potassium in your body. When this happens, you can help balance your level of potassium through the foods that you do or do not eat. Your health care provider or dietitian may recommend an amount of potassium that you should have each  day. The following lists of foods provide the amount of potassium (in parentheses) per serving in each item. High in potassium The following foods and beverages have 200 mg or more of potassium per serving:  Apricots, 2 raw or 5 dry (200 mg).  Artichoke, 1 medium (345 mg).  Avocado, raw,  each (245 mg).  Banana, 1 medium (425 mg).  Beans, lima, or baked beans, canned,  cup (280 mg).  Beans, white, canned,  cup (595 mg).  Beef roast, 3 oz (320 mg).  Beef, ground, 3 oz (270 mg).  Beets, raw or cooked,  cup (260 mg).  Bran muffin, 2 oz (300 mg).  Broccoli,  cup (230 mg).  Brussels sprouts,  cup (250 mg).  Cantaloupe,  cup (215 mg).  Cereal, 100% bran,  cup (200-400 mg).  Cheeseburger, single, fast food, 1 each (225-400 mg).  Chicken, 3 oz (220 mg).  Clams, canned, 3 oz (535 mg).  Crab, 3 oz (225 mg).  Dates, 5 each (270 mg).  Dried beans and peas,  cup (300-475 mg).  Figs, dried, 2 each (260 mg).  Fish: halibut, tuna, cod, snapper, 3 oz (480 mg).  Fish: salmon, haddock, swordfish, perch, 3 oz (300 mg).  Fish, tuna, canned 3 oz (200 mg).  [place] fries, fast food, 3 oz (470 mg).  Granola with fruit and nuts,  cup (200 mg).  Grapefruit juice,  cup (200 mg).  Greens, beet,  cup (655 mg).  Honeydew melon,  cup (200 mg).  Kale, raw, 1 cup (300 mg).  Kiwi, 1 medium (240 mg).  Kohlrabi, rutabaga, parsnips,  cup (280 mg).  Lentils,  cup (365 mg).  Mango, 1 each (325 mg).  Milk, chocolate, 1 cup (420 mg).  Milk: nonfat, low-fat, whole, buttermilk, 1 cup (350-380 mg).  Molasses, 1 Tbsp (295 mg).  Mushrooms,  cup (280) mg.  Nectarine, 1 each (275 mg).  Nuts: almonds, peanuts, hazelnuts, [place], cashew, mixed, 1 oz (200 mg).  Nuts, pistachios, 1 oz (295 mg).  Orange, 1 each (240 mg).  Orange juice,  cup (235 mg).  Papaya, medium,  fruit (390 mg).  Peanut butter, chunky, 2 Tbsp (240 mg).  Peanut butter, smooth, 2 Tbsp (210  mg).  Pear, 1 medium (200 mg).  Pomegranate, 1 whole (400 mg).  Pomegranate juice,  cup (215 mg).  Pork, 3 oz (350 mg).  Potato chips, salted, 1 oz (465 mg).  Potato, baked with skin, 1 medium (925 mg).  Potatoes, boiled,  cup (255 mg).  Potatoes, mashed,  cup (330 mg).  Prune juice,  cup (370 mg).  Prunes, 5 each (305 mg).  Pudding, chocolate,  cup (230 mg).  Pumpkin, canned,  cup (250 mg).  Raisins, seedless,  cup (270 mg).  Seeds, sunflower or pumpkin, 1 oz (240 mg).  Soy milk, 1 cup (300 mg).  Spinach,  cup (420 mg).  Spinach, canned,  cup (370 mg).  Sweet potato, baked with skin, 1 medium (450 mg).  Swiss chard,  cup (480 mg).  Tomato or vegetable juice,  cup (275 mg).  Tomato sauce or puree,  cup (400-550 mg).  Tomato, raw, 1 medium (290 mg).  Tomatoes, canned,  cup (200-300  mg).  [place], 3 oz (250 mg).  Wheat germ, 1 oz (250 mg).  Winter squash,  cup (250 mg).  Yogurt, plain or fruited, 6 oz (260-435 mg).  Zucchini,  cup (220 mg).  Moderate in potassium The following foods and beverages have 50-200 mg of potassium per serving:  Apple, 1 each (150 mg).  Apple juice,  cup (150 mg).  Applesauce,  cup (90 mg).  Apricot nectar,  cup (140 mg).  Asparagus, small spears,  cup or 6 spears (155 mg).  Bagel, cinnamon raisin, 1 each (130 mg).  Bagel, egg or plain, 4 in., 1 each (70 mg).  Beans, green,  cup (90 mg).  Beans, yellow,  cup (190 mg).  Beer, regular, 12 oz (100 mg).  Beets, canned,  cup (125 mg).  Blackberries,  cup (115 mg).  Blueberries,  cup (60 mg).  Bread, whole wheat, 1 slice (70 mg).  Broccoli, raw,  cup (145 mg).  Cabbage,  cup (150 mg).  Carrots, cooked or raw,  cup (180 mg).  Cauliflower, raw,  cup (150 mg).  Celery, raw,  cup (155 mg).  Cereal, bran flakes, cup (120-150 mg).  Cheese, cottage,  cup (110 mg).  Cherries, 10 each (150 mg).  Chocolate, 1 oz bar (165  mg).  Coffee, brewed 6 oz (90 mg).  Corn,  cup or 1 ear (195 mg).  Cucumbers,  cup (80 mg).  Egg, large, 1 each (60 mg).  Eggplant,  cup (60 mg).  Endive, raw, cup (80 mg).  English muffin, 1 each (65 mg).  Fish, orange roughy, 3 oz (150 mg).  Frankfurter, beef or pork, 1 each (75 mg).  Fruit cocktail,  cup (115 mg).  Grape juice,  cup (170 mg).  Grapefruit,  fruit (175 mg).  Grapes,  cup (155 mg).  Greens: kale, turnip, collard,  cup (110-150 mg).  Ice cream or frozen yogurt, chocolate,  cup (175 mg).  Ice cream or frozen yogurt, vanilla,  cup (120-150 mg).  Lemons, limes, 1 each (80 mg).  Lettuce, all types, 1 cup (100 mg).  Mixed vegetables,  cup (150 mg).  Mushrooms, raw,  cup (110 mg).  Nuts: walnuts, pecans, or macadamia, 1 oz (125 mg).  Oatmeal,  cup (80 mg).  Okra,  cup (110 mg).  Onions, raw,  cup (120 mg).  Peach, 1 each (185 mg).  Peaches, canned,  cup (120 mg).  Pears, canned,  cup (120 mg).  Peas, green, frozen,  cup (90 mg).  Peppers, green,  cup (130 mg).  Peppers, red,  cup (160 mg).  Pineapple juice,  cup (165 mg).  Pineapple, fresh or canned,  cup (100 mg).  Plums, 1 each (105 mg).  Pudding, vanilla,  cup (150 mg).  Raspberries,  cup (90 mg).  Rhubarb,  cup (115 mg).  Rice, wild,  cup (80 mg).  Shrimp, 3 oz (155 mg).  Spinach, raw, 1 cup (170 mg).  Strawberries,  cup (125 mg).  Summer squash  cup (175-200 mg).  Swiss chard, raw, 1 cup (135 mg).  Tangerines, 1 each (140 mg).  Tea, brewed, 6 oz (65 mg).  Turnips,  cup (140 mg).  Watermelon,  cup (85 mg).  Wine, red, table, 5 oz (180 mg).  Wine, white, table, 5 oz (100 mg).  Low in potassium The following foods and beverages have less than 50 mg of potassium per serving.  Bread, white, 1 slice (30 mg).  Carbonated beverages, 12 oz (less than 5 mg).  Cheese, 1 oz (20-30 mg).  Cranberries,  cup (45 mg).  Cranberry  juice cocktail,  cup (20 mg).  Fats and oils, 1 Tbsp (less than 5 mg).  Hummus, 1 Tbsp (32 mg).  Nectar: papaya, mango, or pear,  cup (35 mg).  Rice, white or brown,  cup (50 mg).  Spaghetti or macaroni,  cup cooked (30 mg).  Tortilla, flour or corn, 1 each (50 mg).  Waffle, 4 in., 1 each (50 mg).  Water chestnuts,  cup (40 mg).  This information is not intended to replace advice given to you by your health care provider. Make sure you discuss any questions you have with your health care provider. Document Released: 12/02/2004 Document Revised: 09/26/2015 Document Reviewed: 03/17/2013 Elsevier Interactive Patient Education  2018 [employer].  Jaundice, Adult Jaundice is a yellowish discoloration of the skin, the whites of the eyes, and mucous membranes. Jaundice can be a sign that the liver or the bile system is not working normally. What are the causes? This condition is caused by an increased level of bilirubin in the blood. Bilirubin is a substance that is produced by the normal breakdown of red blood cells. Conditions and activities that can cause an increase in the bilirubin level include:  Viral hepatitis.  Gallstones or other conditions, such as a tumor, that can cause a blockage of bile ducts.  Excessive use of alcohol.  Other liver diseases, such as cirrhosis.  Certain cancers.  Certain infections.  Certain genetic syndromes.  Certain medicines.  What are the signs or symptoms? Symptoms of this condition include:  Yellow color to the skin, the whites of the eyes, or mucous membranes.  Dark brown urine.  Stomach pain.  Light or clay-colored stool.  Itchy skin (pruritus).  How is this diagnosed? This condition is diagnosed with a medical history, physical exam, and blood tests. You may have additional tests to determine what is causing your bilirubin level to increase. How is this treated? Treatment for jaundice depends on the underlying  condition. Treatment may include:  Stopping the use of a certain medicine.  Fluids that are given through an IV tube that is inserted into one of your veins.  Medicines to treat pruritus.  Surgery, if there is blockage of the bile ducts.  Follow these instructions at home:  Drink enough fluid to keep your urine clear or pale yellow.  Do not drink alcohol.  Take medicines only as directed by your health care provider.  Keep all follow-up visits as directed by your health care provider. This is important.  You may use skin lotion to relieve itching. Contact a health care provider if:  You have a fever. Get help right away if:  Your symptoms suddenly get worse.  You have symptoms for more than 72 hours.  Your pain gets worse.  You vomit repeatedly.  You become weak or confused.  You develop a severe headache.  You become severely dehydrated. Signs of severe dehydration include: ? A very dry mouth. ? A rapid, weak pulse. ? Rapid breathing. ? Blue lips. This information is not intended to replace advice given to you by your health care provider. Make sure you discuss any questions you have with your health care provider. Document Released: 04/20/2005 Document Revised: 09/26/2015 Document Reviewed: 04/16/2014 Elsevier Interactive Patient Education  [employer].

## 2017-12-31 NOTE — Progress Notes (Signed)
Port will be accessed at physicians office

## 2017-12-31 NOTE — Progress Notes (Signed)
I spoke w/ Ms. Zegers today. She was due for her Prolia, which was prescribed in 2017 but patient has not received yet due to being concerned about side effects. We discussed benefit of Prolia therapy in osteopenia as well as some of the side effects such as low calcium levels. I gave her a some patient drug information. She said she would read it over and discuss it with Dr. Burr Medico at her next visit.   Demetrius Charity, PharmD Oncology Pharmacist  Pharmacy Phone: [phone] 12/31/2017

## 2017-12-31 NOTE — Progress Notes (Signed)
[place]  [phone] [phone]  Clinic Follow up Note   Patient Care Team: [name], NP as PCP - General (Family Medicine) Lelon Perla, MD as PCP - Cardiology (Cardiology) 12/31/2017  SUMMARY OF ONCOLOGIC HISTORY: Oncology History   Cancer Staging Breast cancer of upper-outer quadrant of right female breast [facility]) Staging form: Breast, AJCC 8th Edition - Clinical stage from 09/22/2017: Stage IB (cT1b, cN0, cM0, G2, ER-, PR-, HER2-) - Signed by Truitt Merle, MD on 10/08/2017  Breast cancer, left breast [facility]) Staging form: Breast, AJCC 7th Edition - Clinical stage from 09/12/2012: Stage IA (T1b, N0, M0) - Unsigned        Breast cancer, left breast ([place])   08/25/2012 Receptors her2    ER 100% positive, PR 88% positive, HER-2 negative, Ki-67 16%    08/30/2012 Initial Diagnosis    Breast cancer, left breast    09/12/2012 Pathology Results    T1bN0 Grade 1 invasive ductal carcinoma, and DCIS.    09/12/2012 Surgery    Left breast lumpectomy and sentinel lymph node biopsy, negative margins.    11/09/2012 - 12/13/2012 Radiation Therapy    Adjuvant breast radiation    12/2012 - 11/2017 Anti-estrogen oral therapy    Anastrozole 1 mg daily, switched to Aromasin 25 mg daily in Jan 2015 due to diarrhea. Her Exemestane was switched to letrozole in 08/2017 due to high copay. She completed her 5 years in 11/2017.     09/01/2016 Imaging    [name] Outside films Breast form [facility] 09/01/2016 IMPRESSION: Probably Benign 1. No significant change in oval hypoechoic masses in the left breast at 11:00 2 cm from the nipple and 10:30 4 cm from the nipple. Both of these masses resemble the area of fat necrosis previously biopsies at 2:00. In addition, both masses have developing central benign or dystrophic appearing calcifications and are favored to be other areas of fat necrosis. A 6 month follow-up left breast mammogram and ultrasound is recommended.    09/01/2016  Mammogram    [facility] Mammogram from [facility] 09/01/16 IMPRESSION  Incomplete - additional imaging evaluation needed Ultrasound of the upper medial left breast mass is recommended.     03/09/2017 Mammogram    Previous lumpectomy changes in the upper outer left breast anterior depth with stable associated dystrophic calcifications.  Biopsy clip at the 2:00 in the left breast near the lumpectomy site corresponding to previously biopsied benign fat necrosis.  Persistent round mass in the upper medial left breast with benign-appearing central round calcification.  No significant masses, calcifications, or other findings are seen in the breast  Impression: ultrasound is recommended for the left breast    03/09/2017 Breast US    Left breast ultrasound impression:  No significant change in oval hypoechoic masses in the left breast at 11:00 2 cm from the nipple and 10:30 4 cm from the nipple.  Both of these masses resemble the area of fat necrosis previously biopsied at 2:00.  In addition, both masses have developing central benign or dystrophic appearing calcifications and are favored to be other areas of fat necrosis.  A 63-monthfollow-up bilateral mammogram and left breast ultrasound is recommended.    09/14/2017 Breast [place]   Breast [place] 09[place]14[place]17 at [facility]  IMPRESSION:  The 1 cm oval mass in the left breast at 9:30 posterior depth is highly suggestive of malignancy. An Ultrasound guided biopsy is recommended.  The 0.8 cm round mass in the left breast at 11-12  o'clock anterior depth is suspicious of malignancy. An ultrasound guided biopsy is recommended.     09/22/2017 Pathology Results    Diagnosis 1. Breast, right, needle core biopsy - INVASIVE DUCTAL CARCINOMA, MSBR GRADE II/III. - DUCTAL CARCINOMA IN SITU WITH NECROSIS. 2. Breast, left, needle core biopsy - FAT NECROSIS. - NO EVIDENCE OF MALIGNANCY.    10/17/2017 Genetic Testing    Negative genetic testing on the Multicancer panel.  The  Multi-Gene Panel offered by [facility] includes sequencing and/or deletion duplication testing of the following 83 genes: ALK, APC, ATM, AXIN2,BAP1,  BARD1, BLM, BMPR1A, BRCA1, BRCA2, BRIP1, CASR, CDC73, CDH1, CDK4, CDKN1B, CDKN1C, CDKN2A (p14ARF), CDKN2A (p16INK4a), CEBPA, CHEK2, CTNNA1, DICER1, DIS3L2, EGFR (c.2369C>T, p.Thr790Met variant only), EPCAM (Deletion/duplication testing only), FH, FLCN, GATA2, GPC3, GREM1 (Promoter region deletion/duplication testing only), HOXB13 (c.251G>A, p.Gly84Glu), HRAS, KIT, MAX, MEN1, MET, MITF (c.952G>A, p.Glu318Lys variant only), MLH1, MSH2, MSH3, MSH6, MUTYH, NBN, NF1, NF2, NTHL1, PALB2, PDGFRA, PHOX2B, PMS2, POLD1, POLE, POT1, PRKAR1A, PTCH1, PTEN, RAD50, RAD51C, RAD51D, RB1, RECQL4, RET, RUNX1, SDHAF2, SDHA (sequence changes only), SDHB, SDHC, SDHD, SMAD4, SMARCA4, SMARCB1, SMARCE1, STK11, SUFU, TERT, TERT, TMEM127, TP53, TSC1, TSC2, VHL, WRN and WT1.  The report date is October 17, 2017.     Breast cancer of upper-outer quadrant of right female breast ([place])   09/14/2017 Mammogram    Diagnostic mammogram at [facility]  IMPRESSION:  The new 0.9 cm oval high density mass in the right breast posterior depth superior region seen on the mediolateral oblique view only is indeterminate. The 1.1 cm focal asymmetry in the left breast central to the nipple anterior depth is indeterminate.     09/14/2017 Imaging    [name] Bilateral at [facility] IMPRESSION: The 1 cm oval mass in the right breast at 9:30 posterior depth is highly suggestive of malignancy. The 0.8 cm round mass in the left breast at 11-12 o'clock anterior depth is suspicious of malignancy.     09/22/2017 Cancer Staging    Staging form: Breast, AJCC 8th Edition - Clinical stage from 09/22/2017: Stage IB (cT1b, cN0, cM0, G2, ER-, PR-, HER2-) - Signed by Truitt Merle, MD on 10/08/2017    09/22/2017 Pathology Results    Bilateral needle core biopsy 1. Breast, right, needle core biopsy - INVASIVE DUCTAL CARCINOMA, MSBR GRADE  II/III. - DUCTAL CARCINOMA IN SITU WITH NECROSIS. 2. Breast, left, needle core biopsy - FAT NECROSIS. - NO EVIDENCE OF MALIGNANCY.    09/25/2017 Receptors her2    Estrogen Receptor: 0 Progesterone 0 HER2: Negative. Ki-67: 80%     10/08/2017 Initial Diagnosis    Breast cancer of upper-outer quadrant of right female breast ([place])    10/26/2017 Surgery    BREAST LUMPECTOMY WITH RADIOACTIVE SEED AND SENTINEL LYMPH NODE BIOPSY by Dr. Excell Seltzer  10/26/17    10/26/2017 Pathology Results    Diagnosis 10/26/17 1. Breast, lumpectomy, Right - INVASIVE DUCTAL CARCINOMA, GRADE 2, SPANNING 1 CM. - HIGH GRADE DUCTAL CARCINOMA IN SITU WITH NECROSIS. - FINAL RESECTION MARGINS (PARTS #2, 3, 4) ARE NEGATIVE. - BIOPSY SITE. - SEE ONCOLOGY TABLE. 2. Breast, excision, additional anterior margin- 2 pieces right - BENIGN BREAST TISSUE. 3. Breast, excision, Right unoriented anterior margin - BENIGN BREAST TISSUE. 4. Breast, excision, Right deep margin - BENIGN BREAST TISSUE. 5. Lymph node, sentinel, biopsy, Right Axillary - ONE OF ONE LYMPH NODES NEGATIVE FOR CARCINOMA (0/1).    10/26/2017 Cancer Staging    Staging form: Breast, AJCC 8th Edition - Pathologic stage from 10/26/2017: Stage IB (pT1b,  pN0, cM0, G2, ER-, PR-, HER2-) - Signed by Truitt Merle, MD on 11/12/2017    12/03/2017 -  Chemotherapy    Weekly Abraxane starting 12/03/17   CURRENT THERAPY:  -Anastrozole switched to Aromasin in 2015 due to diarrhea. Aromasin was switched to Letrozole in 08/2017 due to high copay. Plan to stop letrozole end of 11/2017 before chemo. -Adjuvant weekly Abraxane for 12 weeks starting on 12/03/17  INTERVAL HISTORY: Ms. Cassetta returns for follow up and next cycle abraxane as scheduled. She completed 4th weekly dose on 12/24/17. She has increased but stable fatigue from previous cycles. She remains active, takes balance class at the gym. She underwent PAC placement, tolerated well. Appetite is normal. She developed non-tender  but "bothersome" ulcers on her lower lip. Has tried biotin mouth rise. Does not limit eating or drinking. Denies n/v/c/d. Rash on her arms is stable, she continues kenalog. No new rash. Has occasional dry cough, no chest pain, dyspnea, or hemoptysis. No recent fever or chills. Had 1-2 short nosebleeds with noseblowing recently. Denies pain or neuropathy.    MEDICAL HISTORY:  Past Medical History:  Diagnosis Date  . Allergy   . Atrial fibrillation ([place])   . Breast cancer ([place]) 08/25/12   Invasive ductal ca,DCIS  . Diabetes mellitus    type II  . Family history of cancer   . Gout   . Hearing loss   . History of breast cancer   . History of frequent urinary tract infections   . Hx of radiation therapy 11/22/12- 12/19/12   breast 4250 cGy 17 sessions, left breast boost 750 cGy 3 sessions  . Hyperlipidemia   . Hypertension   . Osteopenia     SURGICAL HISTORY: Past Surgical History:  Procedure Laterality Date  . ABDOMINAL HYSTERECTOMY Bilateral 1999   w/b/l salpingo-oopherectomy  . APPENDECTOMY    . BREAST LUMPECTOMY WITH NEEDLE LOCALIZATION AND AXILLARY SENTINEL LYMPH NODE BX Left 09/12/2012   Procedure: LEFT NEEDLE LOCALIZATION BREAST LUMPECTOMY TION AND AXILLARY SENTINEL LYMPH NODE BX;  Surgeon: Edward Jolly, MD;  Location: [place];  Service: General;  Laterality: Left;  . BREAST LUMPECTOMY WITH RADIOACTIVE SEED AND SENTINEL LYMPH NODE BIOPSY Right 10/26/2017   Procedure: BREAST LUMPECTOMY WITH RADIOACTIVE SEED AND SENTINEL LYMPH NODE BIOPSY;  Surgeon: Excell Seltzer, MD;  Location: [place];  Service: General;  Laterality: Right;  . BREAST SURGERY  1990's   right- fibroid cyst  . CHOLECYSTECTOMY    . SEPTOPLASTY    . TONSILLECTOMY      I have reviewed the social history and family history with the patient and they are unchanged from previous note.  ALLERGIES:  is allergic to penicillins; prednisone; and radiaplexrx [woun'dres hydrogel wound  dress].  MEDICATIONS:  Current Outpatient Medications  Medication Sig Dispense Refill  . ACCU-CHEK AVIVA PLUS test strip USE TO TEST TWICE DAILY **ICD-10 CODE E13.9** 200 each 3  . allopurinol (ZYLOPRIM) 300 MG tablet TAKE 1 TABLET (300 MG TOTAL) BY MOUTH DAILY. **DNF 04/06/15** 90 tablet 3  . apixaban (ELIQUIS) 5 MG TABS tablet Take 1 tablet (5 mg total) by mouth 2 (two) times daily. 180 tablet 3  . chlorpheniramine (CHLOR-TRIMETON) 4 MG tablet Take 4 mg 2 (two) times daily as needed by mouth for allergies.    Marland Kitchen diltiazem (CARDIZEM CD) 360 MG 24 hr capsule TAKE 1 CAPSULE (360 MG TOTAL) BY MOUTH DAILY. 90 capsule 3  . fish oil-omega-3 fatty acids 1000 MG capsule Take 1 g by  mouth daily.    . metFORMIN (GLUCOPHAGE) 500 MG tablet Take 1 tablet (500 mg total) by mouth 2 (two) times daily. 180 tablet 3  . Multiple Vitamin (MULTIVITAMIN) tablet Take 1 tablet by mouth daily.    Marland Kitchen OVER THE COUNTER MEDICATION Take by mouth 2 (two) times daily. Presavision- for macular degeneration prevention    . triamcinolone lotion (KENALOG) 0.1 % Apply 1 application topically 3 (three) times daily. 60 mL 1  . VITAMIN D, ERGOCALCIFEROL, PO Take by mouth.    Marland Kitchen atenolol (TENORMIN) 25 MG tablet TAKE 1 TABLET (25 MG TOTAL) BY MOUTH DAILY. 30 tablet 10  . magic mouthwash w/lidocaine SOLN Take 5 mLs by mouth 3 (three) times daily. Swish and spit 240 mL 0  . ondansetron (ZOFRAN) 8 MG tablet Take 1 tablet (8 mg total) by mouth 2 (two) times daily as needed (Nausea or vomiting). (Patient not taking: Reported on 12/31/2017) 30 tablet 1  . prochlorperazine (COMPAZINE) 10 MG tablet Take 1 tablet (10 mg total) by mouth every 6 (six) hours as needed (Nausea or vomiting). (Patient not taking: Reported on 12/31/2017) 30 tablet 1   No current facility-administered medications for this visit.    Facility-Administered Medications Ordered in Other Visits  Medication Dose Route Frequency Provider Last Rate Last Dose  . heparin lock  flush 100 unit/mL  500 Units Intracatheter Once PRN Truitt Merle, MD      . PACLitaxel-protein bound (ABRAXANE) chemo infusion 125 mg  70 mg/m2 (Treatment Plan Recorded) Intravenous Once Truitt Merle, MD      . sodium chloride flush (NS) 0.9 % injection 10 mL  10 mL Intracatheter PRN Truitt Merle, MD        PHYSICAL EXAMINATION: ECOG PERFORMANCE STATUS: 1 - Symptomatic but completely ambulatory  Vitals:   12/31/17 1030  BP: 134/69  Pulse: 87  Resp: 18  Temp: 97.8 F (36.6 C)  SpO2: 98%   Filed Weights   12/31/17 1030  Weight: 136 lb 3.2 oz (61.8 kg)    GENERAL:alert, no distress and comfortable SKIN: skin color, texture, turgor are normal, scattered flat irregular erythematous rash to bilateral arms appears unchanged  EYES: sclera clear OROPHARYNX: 5 small ulcerations to inside lower lip, minimal erythema  LYMPH:  no palpable cervical or supraclavicular lymphadenopathy LUNGS: clear to auscultation with normal breathing effort HEART: regular rate & rhythm, no lower extremity edema ABDOMEN:abdomen soft, non-tender and normal bowel sounds Musculoskeletal:no cyanosis of digits and no clubbing  NEURO: alert & oriented x 3 with fluent speech, no focal motor/sensory deficits PAC with mild erythema and ecchymosis, healing well  Breast incision is well healed, no erythema or drainage; complete breast exam deferred   LABORATORY DATA:  I have reviewed the data as listed CBC Latest Ref Rng & Units 12/31/2017 12/24/2017 12/21/2017  WBC 3.9 - 10.3 K/uL 4.1 2.4(L) 2.4(L)  Hemoglobin 11.6 - 15.9 g/dL 11.1(L) 12.1 12.2  Hematocrit 34.8 - 46.6 % 33.4(L) 36.8 37.0  Platelets 145 - 400 K/uL 188 193 175     CMP Latest Ref Rng & Units 12/31/2017 12/24/2017 12/21/2017  Glucose 70 - 99 mg/dL 157(H) 136(H) 196(H)  BUN 8 - 23 mg/dL 16 26(H) 17  Creatinine 0.44 - 1.00 mg/dL 1.01(H) 1.08(H) 1.05(H)  Sodium 135 - 145 mmol/L 142 140 138  Potassium 3.5 - 5.1 mmol/L 3.4(L) 3.8 3.9  Chloride 98 - 111 mmol/L 105  108 105  CO2 22 - 32 mmol/L _0 Calcium 8.9 - 10.3 mg/dL  10.0 9.2 9.1  Total Protein 6.5 - 8.1 g/dL 6.8 6.7 6.5  Total Bilirubin 0.3 - 1.2 mg/dL 0.4 0.3 0.6  Alkaline Phos 38 - 126 U/L 94 95 91  AST 15 - 41 U/L 72(H) 21 17  ALT 0 - 44 U/L 66(H) 23 22      RADIOGRAPHIC STUDIES: I have personally reviewed the radiological images as listed and agreed with the findings in the report. No results found.   ASSESSMENT & PLAN:  83 y.o.[place], [place] woman   1. Breast cancer of upper outer quadrant of right breast, invasive ductal carcinoma, stage IB, pT1bN0M0, grade 2, Triple Negative, Ki67: 80% 2. pT1bN0, stage IA invasive ductal carcinoma of the left breast, grade I, ER 100%, PR 88%, Ki-67 16%, HER-2/neu negative 3. Osteopenia, on vitamin D; DEXA scan on 09/20/2017 showed: Osteopenia, very high risk of fracture, 29% for major osteoporotic fracture and 18% for hip fracture 4. Hypercalcemia 5. DM 6. Social support  7. Chemotherapy induced neutropenia  8. Skin rash, suspected secondary to abraxane  9. Mucositis, grade 2, secondary to chemotherapy   Ms. Devery appears stable. She completed cycle 2 day 1 (4th weekly dose) abraxane at 70 mg/m2. She continues to tolerate treatment well. She developed grade 2 mucositis, with multiple small ulcerations. She remains able to eat and drink adequately. Lesions are mostly non-tender. We discussed topical therapy with oragel and magic mouthwash, wish I prescribed today.   CBC reviewed, she is not neutropenic.CMP reviewed, with mild transaminitis, possibly related to chemo. Adequate to proceed today. Will monitor closely. She will proceed with cycle 2 day 8 (5th weekly dose) abraxane at 70 mg/m2 today. She will return in 1 week for f/u with Dr. Burr Medico and next cycle. She knows to call with new or worsening mouth sores of difficulty with PO intake.   PLAN: -Labs reviewed -Proceed with cycle 2 day 8 abraxane 70 mg/m2, continue weekly  -For mucositis,  oragel and magic mouthwash prescribed today  -F/u in 1 week with next cycle   All questions were answered. The patient knows to call the clinic with any problems, questions or concerns. No barriers to learning was detected. I spent 20 minutes counseling the patient face to face. The total time spent in the appointment was 25 minutes and more than 50% was on counseling and review of test results     Alla Feeling, NP 12/31/17

## 2017-12-31 NOTE — Telephone Encounter (Signed)
No LOS 8/30  °

## 2018-01-01 ENCOUNTER — Other Ambulatory Visit: Payer: Self-pay | Admitting: Cardiology

## 2018-01-01 DIAGNOSIS — I480 Paroxysmal atrial fibrillation: Secondary | ICD-10-CM

## 2018-01-01 DIAGNOSIS — I4821 Permanent atrial fibrillation: Secondary | ICD-10-CM

## 2018-01-02 ENCOUNTER — Other Ambulatory Visit: Payer: Self-pay | Admitting: Cardiology

## 2018-01-05 NOTE — Telephone Encounter (Signed)
Rx request sent to pharmacy.  

## 2018-01-05 NOTE — Progress Notes (Signed)
[facility] Follow-up office visit  Date of Service:  01/07/2018   ID: [name] OB: 81-11-1934  MR#: [id number]  SJG#:[id number]  PCP: [name], NP      CHIEF COMPLAINT: Follow up for right breast cancer, triple negative   Oncology History   Cancer Staging Breast cancer of upper-outer quadrant of right female breast [facility]) Staging form: Breast, AJCC 8th Edition - Clinical stage from 09/22/2017: Stage IB (cT1b, cN0, cM0, G2, ER-, PR-, HER2-) - Signed by Truitt Merle, MD on 10/08/2017  Breast cancer, left breast [facility]) Staging form: Breast, AJCC 7th Edition - Clinical stage from 09/12/2012: Stage IA (T1b, N0, M0) - Unsigned        Breast cancer, left breast ([place])   08/25/2012 Receptors her2    ER 100% positive, PR 88% positive, HER-2 negative, Ki-67 16%    08/30/2012 Initial Diagnosis    Breast cancer, left breast    09/12/2012 Pathology Results    T1bN0 Grade 1 invasive ductal carcinoma, and DCIS.    09/12/2012 Surgery    Left breast lumpectomy and sentinel lymph node biopsy, negative margins.    11/09/2012 - 12/13/2012 Radiation Therapy    Adjuvant breast radiation    12/2012 - 11/2017 Anti-estrogen oral therapy    Anastrozole 1 mg daily, switched to Aromasin 25 mg daily in Jan 2015 due to diarrhea. Her Exemestane was switched to letrozole in 08/2017 due to high copay. She completed her 5 years in 11/2017.     09/01/2016 Imaging    [name] Outside films Breast form [facility] 09/01/2016 IMPRESSION: Probably Benign 1. No significant change in oval hypoechoic masses in the left breast at 11:00 2 cm from the nipple and 10:30 4 cm from the nipple. Both of these masses resemble the area of fat necrosis previously biopsies at 2:00. In addition, both masses have developing central benign or dystrophic appearing calcifications and are favored to be other areas of fat necrosis. A 6 month follow-up left breast mammogram and ultrasound is recommended.    09/01/2016 Mammogram    HM  Mammogram from [facility] 09/01/16 IMPRESSION  Incomplete - additional imaging evaluation needed Ultrasound of the upper medial left breast mass is recommended.     03/09/2017 Mammogram    Previous lumpectomy changes in the upper outer left breast anterior depth with stable associated dystrophic calcifications.  Biopsy clip at the 2:00 in the left breast near the lumpectomy site corresponding to previously biopsied benign fat necrosis.  Persistent round mass in the upper medial left breast with benign-appearing central round calcification.  No significant masses, calcifications, or other findings are seen in the breast  Impression: ultrasound is recommended for the left breast    03/09/2017 Breast US    Left breast ultrasound impression:  No significant change in oval hypoechoic masses in the left breast at 11:00 2 cm from the nipple and 10:30 4 cm from the nipple.  Both of these masses resemble the area of fat necrosis previously biopsied at 2:00.  In addition, both masses have developing central benign or dystrophic appearing calcifications and are favored to be other areas of fat necrosis.  A 40-monthfollow-up bilateral mammogram and left breast ultrasound is recommended.    09/14/2017 Breast [place]   Breast [place] 09/14/17 at [facility]  IMPRESSION:  The 1 cm oval mass in the left breast at 9:30 posterior depth is highly suggestive of malignancy. An Ultrasound guided biopsy is recommended.  The 0.8 cm round mass in  the left breast at 11-12 o'clock anterior depth is suspicious of malignancy. An ultrasound guided biopsy is recommended.     09/22/2017 Pathology Results    Diagnosis 1. Breast, right, needle core biopsy - INVASIVE DUCTAL CARCINOMA, MSBR GRADE II/III. - DUCTAL CARCINOMA IN SITU WITH NECROSIS. 2. Breast, left, needle core biopsy - FAT NECROSIS. - NO EVIDENCE OF MALIGNANCY.    10/17/2017 Genetic Testing    Negative genetic testing on the Multicancer panel.  The Multi-Gene Panel  offered by [facility] includes sequencing and/or deletion duplication testing of the following 83 genes: ALK, APC, ATM, AXIN2,BAP1,  BARD1, BLM, BMPR1A, BRCA1, BRCA2, BRIP1, CASR, CDC73, CDH1, CDK4, CDKN1B, CDKN1C, CDKN2A (p14ARF), CDKN2A (p16INK4a), CEBPA, CHEK2, CTNNA1, DICER1, DIS3L2, EGFR (c.2369C>T, p.Thr790Met variant only), EPCAM (Deletion/duplication testing only), FH, FLCN, GATA2, GPC3, GREM1 (Promoter region deletion/duplication testing only), HOXB13 (c.251G>A, p.Gly84Glu), HRAS, KIT, MAX, MEN1, MET, MITF (c.952G>A, p.Glu318Lys variant only), MLH1, MSH2, MSH3, MSH6, MUTYH, NBN, NF1, NF2, NTHL1, PALB2, PDGFRA, PHOX2B, PMS2, POLD1, POLE, POT1, PRKAR1A, PTCH1, PTEN, RAD50, RAD51C, RAD51D, RB1, RECQL4, RET, RUNX1, SDHAF2, SDHA (sequence changes only), SDHB, SDHC, SDHD, SMAD4, SMARCA4, SMARCB1, SMARCE1, STK11, SUFU, TERT, TERT, TMEM127, TP53, TSC1, TSC2, VHL, WRN and WT1.  The report date is October 17, 2017.     Breast cancer of upper-outer quadrant of right female breast (HCC)   09/14/2017 Mammogram    Diagnostic mammogram at [facility]  IMPRESSION:  The new 0.9 cm oval high density mass in the right breast posterior depth superior region seen on the mediolateral oblique view only is indeterminate. The 1.1 cm focal asymmetry in the left breast central to the nipple anterior depth is indeterminate.     09/14/2017 Imaging    US Bilateral at [facility] IMPRESSION: The 1 cm oval mass in the right breast at 9:30 posterior depth is highly suggestive of malignancy. The 0.8 cm round mass in the left breast at 11-12 o'clock anterior depth is suspicious of malignancy.     09/22/2017 Cancer Staging    Staging form: Breast, AJCC 8th Edition - Clinical stage from 09/22/2017: Stage IB (cT1b, cN0, cM0, G2, ER-, PR-, HER2-) - Signed by Chistine Dematteo, MD on 10/08/2017    09/22/2017 Pathology Results    Bilateral needle core biopsy 1. Breast, right, needle core biopsy - INVASIVE DUCTAL CARCINOMA, MSBR GRADE II/III. - DUCTAL  CARCINOMA IN SITU WITH NECROSIS. 2. Breast, left, needle core biopsy - FAT NECROSIS. - NO EVIDENCE OF MALIGNANCY.    09/25/2017 Receptors her2    Estrogen Receptor: 0 Progesterone 0 HER2: Negative. Ki-67: 80%     10/08/2017 Initial Diagnosis    Breast cancer of upper-outer quadrant of right female breast (HCC)    10/26/2017 Surgery    BREAST LUMPECTOMY WITH RADIOACTIVE SEED AND SENTINEL LYMPH NODE BIOPSY by Dr. Hoxworth  10/26/17    10/26/2017 Pathology Results    Diagnosis 10/26/17 1. Breast, lumpectomy, Right - INVASIVE DUCTAL CARCINOMA, GRADE 2, SPANNING 1 CM. - HIGH GRADE DUCTAL CARCINOMA IN SITU WITH NECROSIS. - FINAL RESECTION MARGINS (PARTS #2, 3, 4) ARE NEGATIVE. - BIOPSY SITE. - SEE ONCOLOGY TABLE. 2. Breast, excision, additional anterior margin- 2 pieces right - BENIGN BREAST TISSUE. 3. Breast, excision, Right unoriented anterior margin - BENIGN BREAST TISSUE. 4. Breast, excision, Right deep margin - BENIGN BREAST TISSUE. 5. Lymph node, sentinel, biopsy, Right Axillary - ONE OF ONE LYMPH NODES NEGATIVE FOR CARCINOMA (0/1).    10/26/2017 Cancer Staging    Staging form: Breast, AJCC 8th Edition - Pathologic stage   from 10/26/2017: Stage IB (pT1b, pN0, cM0, G2, ER-, PR-, HER2-) - Signed by Truitt Merle, MD on 11/12/2017    12/03/2017 -  Chemotherapy    Weekly Abraxane starting 12/03/17      CURRENT THERAPY:   -Adjuvant weekly Abraxane for 12 weeks starting on 12/03/17   INTERVAL HISTORY:  Daneka returns for follow-up for right breast cancer and weekly Abraxane. She presents to the clinic today with her daughter. She notes it was a lot for her to do [facility] placement and infusion. She note she was very fatigued. She has recovered now.  She notes her rash on b/l forearms which has not improved much. She has been using topical steroid cream 3 times a day. She notes she is eating well and remains active. She has been taking a 45 minute  balance class at [facility].  She notes she  has diarrhea, mostly after she eats. She notes last night she did not have diarrhea immediately. She denies neuropathy and has been using cryotherapy with infusions. She notes nosebleeds lately, but not significantly. Her prior mouth sores have healed.    PAST MEDICAL HISTORY: Past Medical History:  Diagnosis Date  . Allergy   . Atrial fibrillation ([place])   . Breast cancer ([place]) 08/25/12   Invasive ductal ca,DCIS  . Diabetes mellitus    type II  . Family history of cancer   . Gout   . Hearing loss   . History of breast cancer   . History of frequent urinary tract infections   . Hx of radiation therapy 11/22/12- 12/19/12   breast 4250 cGy 17 sessions, left breast boost 750 cGy 3 sessions  . Hyperlipidemia   . Hypertension   . Osteopenia     PAST SURGICAL HISTORY: Past Surgical History:  Procedure Laterality Date  . ABDOMINAL HYSTERECTOMY Bilateral 1999   w/b/l salpingo-oopherectomy  . APPENDECTOMY    . BREAST LUMPECTOMY WITH NEEDLE LOCALIZATION AND AXILLARY SENTINEL LYMPH NODE BX Left 09/12/2012   Procedure: LEFT NEEDLE LOCALIZATION BREAST LUMPECTOMY TION AND AXILLARY SENTINEL LYMPH NODE BX;  Surgeon: Edward Jolly, MD;  Location: [place];  Service: General;  Laterality: Left;  . BREAST LUMPECTOMY WITH RADIOACTIVE SEED AND SENTINEL LYMPH NODE BIOPSY Right 10/26/2017   Procedure: BREAST LUMPECTOMY WITH RADIOACTIVE SEED AND SENTINEL LYMPH NODE BIOPSY;  Surgeon: Excell Seltzer, MD;  Location: [facility];  Service: General;  Laterality: Right;  . BREAST SURGERY  1990's   right- fibroid cyst  . CHOLECYSTECTOMY    . SEPTOPLASTY    . TONSILLECTOMY      FAMILY HISTORY Family History  Problem Relation Age of Onset  . Hypertension Mother   . Heart disease Mother        Murmur and irregular heart disease  . Dementia Mother        d. 53  . Hypertension Father   . Aneurysm Father 13  . Breast cancer Cousin 26       mat first cousin    GYNECOLOGIC HISTORY:  menarche at age 43, g51 p3, s/p TAH/BSO in early 82s.  Was on HRT for several years (cannot recall the duration).  No h/o abnormal pap smears or sexually transmitted infections.    SOCIAL HISTORY: Lives in [place], [place] in a three story house, will move to [facility] soon.  Her husband lives at a nursing home due to dementia.  She is a retired Pharmacist, hospital.  Her son and daughter live in [place].  ADVANCED DIRECTIVES: Not in place.    HEALTH MAINTENANCE: Social History   Tobacco Use  . Smoking status: Former Smoker    Packs/day: 0.25    Years: 4.00    Pack years: 1.00    Types: Cigarettes  . Smokeless tobacco: Never Used  . Tobacco comment: Cigarette use was 50 years ago  Substance Use Topics  . Alcohol use: Yes    Comment: Occ glass of wine with dinner  . Drug use: No    Mammogram: 09/14/2017  Colonoscopy: 2011 Bone Density Scan: 03/05/2015 osteopenia  Pap Smear: s/p tah/bso Eye Exam: due Vitamin D Level:  Normal, 04/14/13 Lipid Panel: unsure   Allergies  Allergen Reactions  . Penicillins Hives and Swelling  . Prednisone Other (See Comments)    ELEVATED BLOOD SUGAR  . Radiaplexrx [Woun'Dres Hydrogel Wound Dress] Hives    Current Outpatient Medications  Medication Sig Dispense Refill  . ACCU-CHEK AVIVA PLUS test strip USE TO TEST TWICE DAILY **ICD-10 CODE E13.9** 200 each 3  . allopurinol (ZYLOPRIM) 300 MG tablet TAKE 1 TABLET (300 MG TOTAL) BY MOUTH DAILY. **DNF 04/06/15** 90 tablet 3  . chlorpheniramine (CHLOR-TRIMETON) 4 MG tablet Take 4 mg 2 (two) times daily as needed by mouth for allergies.    Marland Kitchen diltiazem (CARDIZEM CD) 360 MG 24 hr capsule TAKE 1 CAPSULE (360 MG TOTAL) BY MOUTH DAILY. 90 capsule 3  . ELIQUIS 5 MG TABS tablet TAKE 1 TABLET BY MOUTH TWICE A DAY 180 tablet 1  . fish oil-omega-3 fatty acids 1000 MG capsule Take 1 g by mouth daily.    Marland Kitchen loperamide (IMODIUM) 2 MG capsule Take 2 mg by mouth as needed for diarrhea or loose stools.    . magic  mouthwash w/lidocaine SOLN Take 5 mLs by mouth 3 (three) times daily. Swish and spit 240 mL 0  . metFORMIN (GLUCOPHAGE) 500 MG tablet Take 1 tablet (500 mg total) by mouth 2 (two) times daily. 180 tablet 3  . Multiple Vitamin (MULTIVITAMIN) tablet Take 1 tablet by mouth daily.    . ondansetron (ZOFRAN) 8 MG tablet Take 1 tablet (8 mg total) by mouth 2 (two) times daily as needed (Nausea or vomiting). 30 tablet 1  . OVER THE COUNTER MEDICATION Take by mouth 2 (two) times daily. Presavision- for macular degeneration prevention    . prochlorperazine (COMPAZINE) 10 MG tablet Take 1 tablet (10 mg total) by mouth every 6 (six) hours as needed (Nausea or vomiting). 30 tablet 1  . triamcinolone lotion (KENALOG) 0.1 % Apply 1 application topically 3 (three) times daily. 60 mL 1  . VITAMIN D, ERGOCALCIFEROL, PO Take by mouth.    Marland Kitchen atenolol (TENORMIN) 25 MG tablet TAKE 1 TABLET (25 MG TOTAL) BY MOUTH DAILY. 30 tablet 10   No current facility-administered medications for this visit.    Facility-Administered Medications Ordered in Other Visits  Medication Dose Route Frequency Provider Last Rate Last Dose  . heparin lock flush 100 unit/mL  500 Units Intracatheter Once PRN Truitt Merle, MD      . PACLitaxel-protein bound (ABRAXANE) chemo infusion 125 mg  70 mg/m2 (Treatment Plan Recorded) Intravenous Once Truitt Merle, MD 50 mL/hr at 01/07/18 1412 125 mg at 01/07/18 1412  . sodium chloride flush (NS) 0.9 % injection 10 mL  10 mL Intracatheter PRN Truitt Merle, MD       REVIEW OF SYSTEMS:   Constitutional: Denies fevers, chills or abnormal night sweats Eyes: Denies blurriness of vision, double vision or watery  eyes Ears, nose, mouth, throat, and face: Denies mucositis or sore throat (+) occasional epistaxis   Respiratory: Denies cough, dyspnea or wheezes Cardiovascular: Denies palpitation, chest discomfort or lower extremity swelling Gastrointestinal:  Denies nausea, heartburn or change in bowel habits (+)  intermittent diarrhea  Skin: Denies abnormal skin rashes (+) moderate diffuse chemo rash on b/l forearms, will itching  Lymphatics: Denies new lymphadenopathy or easy bruising Neurological:Denies numbness, tingling or new weaknesses Behavioral/Psych: Mood is stable, no new changes  All other systems were reviewed with the patient and are negative.  OBJECTIVE:   Vitals:   01/07/18 1142  BP: 138/77  Pulse: 84  Resp: 17  Temp: 97.6 F (36.4 C)  SpO2: 99%     Body mass index is 25.3 kg/m.     GENERAL: Patient is a well appearing female in no acute distress HEENT:  Sclerae anicteric.  Oropharynx clear and moist. No ulcerations or evidence of oropharyngeal candidiasis. Neck is supple.  NODES:  No cervical, supraclavicular, or axillary lymphadenopathy palpated.  LUNGS:  Clear to auscultation bilaterally.  No wheezes or rhonchi. HEART:  Regular rate and rhythm. No murmur appreciated. ABDOMEN:  Soft, nontender.  Positive, normoactive bowel sounds. No organomegaly palpated. MSK:  No focal spinal tenderness to palpation. Full range of motion bilaterally in the upper extremities. EXTREMITIES:  No peripheral edema.   SKIN:  Clear with no obvious rashes or skin changes. No nail  dyscrasia. (+) bruises from blood withdrawal  (+) diffuse chemo rash on b/l forearms, with mild skin erythema  NEURO:  Nonfocal. Well oriented.  Appropriate affect. Breasts: Breast inspection showed them to be symmetrical with no nipple discharge. (+) S/p Right Breast Lumpectomy: Surgical incision in right breast and axilla healing very well, no discharge. Palpation of the breasts and axilla revealed no obvious mass that I could appreciate.    ECOG FS:1 - Symptomatic but completely ambulatory  LAB RESULTS: CBC Latest Ref Rng & Units 01/07/2018 12/31/2017 12/24/2017  WBC 3.9 - 10.3 K/uL 5.9 4.1 2.4(L)  Hemoglobin 11.6 - 15.9 g/dL 10.3(L) 11.1(L) 12.1  Hematocrit 34.8 - 46.6 % 31.0(L) 33.4(L) 36.8  Platelets 145 - 400 K/uL  249 188 193    CMP Latest Ref Rng & Units 01/07/2018 12/31/2017 12/24/2017  Glucose 70 - 99 mg/dL 147(H) 157(H) 136(H)  BUN 8 - 23 mg/dL 22 16 26(H)  Creatinine 0.44 - 1.00 mg/dL 1.05(H) 1.01(H) 1.08(H)  Sodium 135 - 145 mmol/L 142 142 140  Potassium 3.5 - 5.1 mmol/L 3.4(L) 3.4(L) 3.8  Chloride 98 - 111 mmol/L 109 105 108  CO2 22 - 32 mmol/L '23 25 24  '$ Calcium 8.9 - 10.3 mg/dL 8.8(L) 10.0 9.2  Total Protein 6.5 - 8.1 g/dL 6.8 6.8 6.7  Total Bilirubin 0.3 - 1.2 mg/dL 0.4 0.4 0.3  Alkaline Phos 38 - 126 U/L 99 94 95  AST 15 - 41 U/L 27 72(H) 21  ALT 0 - 44 U/L 36 66(H) 23   ANC 3.9K   PATHOLOGY REPORT  Diagnosis 10/26/17 1. Breast, lumpectomy, Right - INVASIVE DUCTAL CARCINOMA, GRADE 2, SPANNING 1 CM. - HIGH GRADE DUCTAL CARCINOMA IN SITU WITH NECROSIS. - FINAL RESECTION MARGINS (PARTS #2, 3, 4) ARE NEGATIVE. - BIOPSY SITE. - SEE ONCOLOGY TABLE. 2. Breast, excision, additional anterior margin- 2 pieces right - BENIGN BREAST TISSUE. 3. Breast, excision, Right unoriented anterior margin - BENIGN BREAST TISSUE. 4. Breast, excision, Right deep margin - BENIGN BREAST TISSUE. 5. Lymph node, sentinel, biopsy, Right Axillary - ONE  OF ONE LYMPH NODES NEGATIVE FOR CARCINOMA (0/1). Microscopic Comment 1. INVASIVE CARCINOMA OF THE BREAST: Resection Procedure: Right breast lumpectomy with additional anterior and posterior margin. Right axillary lymph node biopsy. Specimen Laterality: Right. Tumor Size: 1 cm. Histologic Type: Invasive ductal carcinoma. Histologic Grade: Glandular (Acinar)/Tubular Differentiation: 2 1 of 3 Amended copy Corrected FINAL for AERIKA, GROLL ([id number]) Microscopic Comment(continued) Nuclear Pleomorphism: 2 Mitotic Rate: 2 Overall Grade: 2 Ductal Carcinoma In Situ: Present, high grade with necrosis. Tumor Extension: Confined to breast parenchyma. Margins: Distance from closest margin (millimeters): Originally <1 mm anterior, 1 mm posterior. Final  anterior and posterior margins (parts 2, 3, 4) are negative. Specify closest margin (required only if <25m): Final margins >10 mm. DCIS Margins Distance from closest margin (millimeters): Original 2-3 mm anterior, 1 mm posterior. Final anterior and posterior margins (parts 2, 3, 4) are negative. Specify closest margin (required only if <13m: Final margins >10 mm Regional Lymph Nodes: Number of Lymph Nodes Examined: 1 Number of Sentinel Nodes Examined (if applicable): 1 Number of Lymph Nodes with Macrometastases (>2 mm): 0 Number of Lymph Nodes with Micrometastases: 0 Number of Lymph Nodes with Isolated Tumor Cells (0.2 mm or 200 cells)#: 0 Size of Largest Metastatic Deposit (millimeters): N/A. Extranodal Extension N/A. Treatment Effect: No known presurgical therapy Breast Biomarker Testing Performed on Previous Biopsy: Testing Performed on Case Number: [id number] Receptor: 0 Progesterone 0 HER2: Negative. Ki-67: 80% Representative tumor block: 1A-C. Pathologic Stage Classification (pTNM, AJCC 8th Edition): pT1b, pN0 (v4.2.0.0)   Diagnosis, 09/22/2017 1. Breast, right, needle core biopsy - INVASIVE DUCTAL CARCINOMA, MSBR GRADE II/III. - DUCTAL CARCINOMA IN SITU WITH NECROSIS. 2. Breast, left, needle core biopsy - FAT NECROSIS. - NO EVIDENCE OF MALIGNANCY.   Diagnosis  08/28/2015 Breast, left, needle core biopsy - FAT NECROSIS. - THERE IS NO EVIDENCE OF MALIGNANCY. - SEE COMMENT.         STUDIES: I have requested her recent mammogram report from [facility] DEXA scan, 09/20/2017  Osteopenia, very high risk of fracture, 29% for major osteoporotic fracture and 18% for hip fracture.   Breast Diagnostic 09/14/17 at [facility]  IMPRESSION:  The new 0.9 cm oval high density mass in the right breast posterior depth superior region seen on the mediolateral oblique view only is indeterminate. The 1.1 cm focal asymmetry in the left breast central to the nipple anterior  depth is indeterminate.   [facility], 09/14/2017 at [facility] IMPRESSION: The 1 cm oval mass in the right breast at 9:30 posterior depth is highly suggestive of malignancy. The 0.8 cm round mass in the left breast at 11-12 o'clock anterior depth is suspicious of malignancy.    [facility] Breast 03/09/17  IMPRESSION: 1. No significant change in oval masses in the left breast at the 11:00 cm from the nipple and 10:30 4 cm from the nipple.  Both of these masses resemble the area of fat necrosis previously biopsied at 2:00. In addition, both of these masses have developing central benign or dystrophic appearing calcifications and are favored to be other areas of fat necrosis.  A 6 month follow up bilateral mammogram and left breast [facility] recommended.  [facility] Breast form [facility] 09/01/2016 IMPRESSION: Probably Benign 1. No significant change in oval hypoechoic masses in the left breast at 11:00 2 cm from the nipple and 10:30 4 cm from the nipple. Both of these masses resemble the area of fat necrosis previously biopsies at 2:00. In addition, both masses have  developing central benign or dystrophic appearing calcifications and are favored to be other areas of fat necrosis. A 6 month follow-up left breast mammogram and ultrasound is recommended.  HM Mammogram from [facility] 09/01/16 IMPRESSION  Incomplete - additional imaging evaluation needed Ultrasound of the upper medial left breast mass is recommended.   Left breast mammogram and ultrasound 02/27/2016 Impression: probably benign The 4 mm oval mass in the left breast 12:00 position likely represent fat necrosis, the 3 mm mass in the left breast upper inner quadrant middle depth most likely is fat necrosis, the 4 mm oval cyst in the left breast upper inner quadrant posterior depth is benign. Post surgical scar in the left breast upper outer quadrant anterior depth is benign.  ASSESSMENT:  83 y.o. [place], [place] woman   1.  Breast cancer of  upper outer quadrant of right breast, invasive ductal carcinoma, stage IB, pT1bN0M0, grade 2, Triple Negative, Ki67: 80% -We previously discussed that her newly diagnosed right breast is different from her previous left breast cancer. It is triple negative and more aggressive. We previously discussed moderate to high risk of recurrence, and the role of adjuvant chemo. Due to her advanced age, she is not a candidate for intensive chemotherapy. However she does have overall good health and performance status, she likely would be able to tolerate single agent Taxol or Abraxane if needed. -Her genetic testing was negative -She previously underwent right breast lumpectomy with SLNB by Dr. Excell Seltzer on 10/26/17. I discussed her pathology which shows a 1cm tumor, G2, triple negative tumor was completely resection with negative margin, node negative. I discussed this is more aggressive than her previous cancer.  -She has started weekly Abraxane on 12/03/17. She has tolerated moderately well with fatigue, mild skin rash, anemia and neutropenia. I will keep her Abraxane at '70mg'$ /m2 due to neutropenia. She also has intermittent diarrhea and nosebleeds lately along with fatigue. I encouraged her to take prophylactic imodium daily.  -Given her fatigue with treatment and her age I discussed the option of stopping her treatment shorter than 3 months, and have a few one week chemo breaks. She opted to continue with treatment today and take a break next week before restarting.  -Labs reviewed today, Hg at 10.3, ANC at 3.9, Cr at 1.05, Ca at 8.8, overall adequate to proceed with Abraxane today  -F/u in 2 weeks    2. pT1bN0, stage IA invasive ductal carcinoma of the left breast, grade I, ER 100%, PR 88%, Ki-67 16%, HER-2/neu negative. -She previously underwent lumpectomy with sentinel lymph node biopsy on 09/12/2012, s/p radiation therapy from 11/09/2012 through 12/19/2012.  -Her left breast biopsy in 08/2015 showed fat  necrosis. No clinical evidence of recurrence. -Her repeated mammogram in May 2018 showed 2 lesions in UIQ of left breast, stable, likely benign (fat necrosis per biopsy in 2017) -Left breast mammogram and ultrasound on 03/09/2017 reveal hypoechoic masses that have developed central benign or dystrophic appearing calcifications and are favored to be areas of fat necrosis. -She was on adjuvant Exemestane, tolerated well -09/16/17 US showed mass behind her left breast scar and a mass In her right breast, both highly suggestive of malignancy.  -Her Exemestane was switched to letrozole in 08/2017 due to high copay. She tolerated well and completed in 11/2017.   3. Osteopenia -She is on vitamin D supplement daily. -Given her worsening hypercalcemia, I told her to stop calcium  -Her recent bone density scan from 03/05/2015 showed osteopenia, with 10 year probability of  major ostial parotic fracture 25%, and hip fracture 15%.  -Given the high risk of fracture, she would benefit from bisphosphonate or Prolia injection. I previously discussed the benefit and potential side effects, and written material of Prolia was given. She has not done Prolia injection yet and she is hesitant to due to the concern of side effects. I will discuss the options of adjuvant Zometa after her chemotherapy.  -Patient most recent DEXA scan on 09/20/2017 showed: Osteopenia, very high risk of fracture, 29% for major osteoporotic fracture and 18% for hip fracture.    4. Hypercalcemia  -She knows to drink adequately and avoid dehydration  -She was previously instructed to restart Vit D in 03/2017 as well as continue light weight bearing exercise.  -She'll follow up with primary care physician for further workup.  -Ca at 8.8 today (01/07/18)  5. Diabetes and hyperglycemia  -She noticed hyperglycemia since she started steroids for her left hip pain. I previously suggested she stop taking her steroid medication as she no longer needs it.    -I previously encouraged her to f/u with PCP to see if she needs to change her metformin medication or dosage as her sugar has been higher lately.  -BG at 147 today, stable (01/07/18)   6. Social Support -She lives at [facility] and is very independent -Her children live close by her   7. Chemotherapy induced neutropenia  - I will keep her Abraxane at '70mg'$ /m2 due to neutropenia.  -Normal today at 3.9 (01/07/18)  8. Skin rash on b/l forearms, suspected secondary to Abraxane  -She is applying kenalog, no new eruptions. Will monitor closely.  9. Mucositis, secondary to chemotherapy  -She previously developed grade 2 mucositis, with multiple small ulcerations. She remains able to eat and drink adequately. Lesions are mostly non-tender.  -We previously discussed topical therapy with oragel and magic mouthwash. NP Lacie prescribed on 12/31/17. -Currently resolved, I encouraged her to continue with mouthwash.    Plan -Lab reviewed and adequate to proceed with Abraxane at reduced dose '70mg'$ /m2 due to her mild neutropenia -Cancel appointments next week, she will take a chemo break  -f/u and chemo in 2 weeks    I spent 20 minutes counseling the patient face to face.  The total time spent in the appointment was 25 minutes.  Oneal Deputy, am acting as scribe for Truitt Merle, MD.   I have reviewed the above documentation for accuracy and completeness, and I agree with the above.    Truitt Merle   01/07/2018

## 2018-01-07 ENCOUNTER — Inpatient Hospital Stay: Payer: Medicare Other

## 2018-01-07 ENCOUNTER — Telehealth: Payer: Self-pay | Admitting: Hematology

## 2018-01-07 ENCOUNTER — Inpatient Hospital Stay: Payer: Medicare Other | Attending: Hematology

## 2018-01-07 ENCOUNTER — Encounter: Payer: Self-pay | Admitting: Hematology

## 2018-01-07 ENCOUNTER — Inpatient Hospital Stay: Payer: Medicare Other | Admitting: Hematology

## 2018-01-07 VITALS — BP 138/77 | HR 84 | Temp 97.6°F | Resp 17 | Ht 61.0 in | Wt 133.9 lb

## 2018-01-07 DIAGNOSIS — Z853 Personal history of malignant neoplasm of breast: Secondary | ICD-10-CM | POA: Insufficient documentation

## 2018-01-07 DIAGNOSIS — E1165 Type 2 diabetes mellitus with hyperglycemia: Secondary | ICD-10-CM

## 2018-01-07 DIAGNOSIS — Z171 Estrogen receptor negative status [ER-]: Secondary | ICD-10-CM | POA: Diagnosis not present

## 2018-01-07 DIAGNOSIS — R21 Rash and other nonspecific skin eruption: Secondary | ICD-10-CM | POA: Diagnosis not present

## 2018-01-07 DIAGNOSIS — Z5111 Encounter for antineoplastic chemotherapy: Secondary | ICD-10-CM | POA: Insufficient documentation

## 2018-01-07 DIAGNOSIS — M8580 Other specified disorders of bone density and structure, unspecified site: Secondary | ICD-10-CM | POA: Diagnosis not present

## 2018-01-07 DIAGNOSIS — C50411 Malignant neoplasm of upper-outer quadrant of right female breast: Secondary | ICD-10-CM | POA: Diagnosis not present

## 2018-01-07 DIAGNOSIS — Z95828 Presence of other vascular implants and grafts: Secondary | ICD-10-CM

## 2018-01-07 DIAGNOSIS — C50412 Malignant neoplasm of upper-outer quadrant of left female breast: Secondary | ICD-10-CM

## 2018-01-07 DIAGNOSIS — D701 Agranulocytosis secondary to cancer chemotherapy: Secondary | ICD-10-CM | POA: Insufficient documentation

## 2018-01-07 DIAGNOSIS — Z98891 History of uterine scar from previous surgery: Secondary | ICD-10-CM

## 2018-01-07 DIAGNOSIS — Z23 Encounter for immunization: Secondary | ICD-10-CM | POA: Insufficient documentation

## 2018-01-07 DIAGNOSIS — Z170 Estrogen receptor positive status [ER+]: Secondary | ICD-10-CM

## 2018-01-07 DIAGNOSIS — Z87891 Personal history of nicotine dependence: Secondary | ICD-10-CM | POA: Diagnosis not present

## 2018-01-07 LAB — CMP (CANCER CENTER ONLY)
ALBUMIN: 3 g/dL — AB (ref 3.5–5.0)
ALK PHOS: 99 U/L (ref 38–126)
ALT: 36 U/L (ref 0–44)
AST: 27 U/L (ref 15–41)
Anion gap: 10 (ref 5–15)
BILIRUBIN TOTAL: 0.4 mg/dL (ref 0.3–1.2)
BUN: 22 mg/dL (ref 8–23)
CO2: 23 mmol/L (ref 22–32)
CREATININE: 1.05 mg/dL — AB (ref 0.44–1.00)
Calcium: 8.8 mg/dL — ABNORMAL LOW (ref 8.9–10.3)
Chloride: 109 mmol/L (ref 98–111)
GFR, EST NON AFRICAN AMERICAN: 49 mL/min — AB (ref >60)
GFR, Est AFR Am: 57 mL/min — ABNORMAL LOW (ref >60)
GLUCOSE: 147 mg/dL — AB (ref 70–99)
Potassium: 3.4 mmol/L — ABNORMAL LOW (ref 3.5–5.1)
Sodium: 142 mmol/L (ref 135–145)
TOTAL PROTEIN: 6.8 g/dL (ref 6.5–8.1)

## 2018-01-07 LAB — CBC WITH DIFFERENTIAL (CANCER CENTER ONLY)
BASOS ABS: 0.1 10*3/uL (ref 0.0–0.1)
Basophils Relative: 1 %
EOS ABS: 0.1 10*3/uL (ref 0.0–0.5)
EOS PCT: 1 %
HCT: 31 % — ABNORMAL LOW (ref 34.8–46.6)
Hemoglobin: 10.3 g/dL — ABNORMAL LOW (ref 11.6–15.9)
LYMPHS ABS: 1.2 10*3/uL (ref 0.9–3.3)
LYMPHS PCT: 20 %
MCH: 31.6 pg (ref 25.1–34.0)
MCHC: 33.2 g/dL (ref 31.5–36.0)
MCV: 95.1 fL (ref 79.5–101.0)
Monocytes Absolute: 0.7 10*3/uL (ref 0.1–0.9)
Monocytes Relative: 12 %
NEUTROS PCT: 66 %
Neutro Abs: 3.9 10*3/uL (ref 1.5–6.5)
PLATELETS: 249 10*3/uL (ref 145–400)
RBC: 3.26 MIL/uL — ABNORMAL LOW (ref 3.70–5.45)
RDW: 15 % — ABNORMAL HIGH (ref 11.2–14.5)
WBC Count: 5.9 10*3/uL (ref 3.9–10.3)

## 2018-01-07 MED ORDER — PACLITAXEL PROTEIN-BOUND CHEMO INJECTION 100 MG
70.0000 mg/m2 | Freq: Once | INTRAVENOUS | Status: AC
  Administered 2018-01-07: 125 mg via INTRAVENOUS
  Filled 2018-01-07: qty 25

## 2018-01-07 MED ORDER — SODIUM CHLORIDE 0.9% FLUSH
10.0000 mL | INTRAVENOUS | Status: DC | PRN
  Administered 2018-01-07: 10 mL via INTRAVENOUS
  Filled 2018-01-07: qty 10

## 2018-01-07 MED ORDER — PROCHLORPERAZINE MALEATE 10 MG PO TABS
10.0000 mg | ORAL_TABLET | Freq: Once | ORAL | Status: AC
  Administered 2018-01-07: 10 mg via ORAL

## 2018-01-07 MED ORDER — SODIUM CHLORIDE 0.9% FLUSH
10.0000 mL | INTRAVENOUS | Status: DC | PRN
  Administered 2018-01-07: 10 mL
  Filled 2018-01-07: qty 10

## 2018-01-07 MED ORDER — PROCHLORPERAZINE MALEATE 10 MG PO TABS
ORAL_TABLET | ORAL | Status: AC
  Filled 2018-01-07: qty 1

## 2018-01-07 MED ORDER — SODIUM CHLORIDE 0.9 % IV SOLN
Freq: Once | INTRAVENOUS | Status: AC
  Administered 2018-01-07: 13:00:00 via INTRAVENOUS
  Filled 2018-01-07: qty 250

## 2018-01-07 MED ORDER — HEPARIN SOD (PORK) LOCK FLUSH 100 UNIT/ML IV SOLN
500.0000 [IU] | Freq: Once | INTRAVENOUS | Status: AC | PRN
  Administered 2018-01-07: 500 [IU]
  Filled 2018-01-07: qty 5

## 2018-01-07 NOTE — Patient Instructions (Signed)
[facility] Discharge Instructions for Patients Receiving Chemotherapy  Today you received the following chemotherapy agents Abraxane  To help prevent nausea and vomiting after your treatment, we encourage you to take your nausea medication as directed.    If you develop nausea and vomiting that is not controlled by your nausea medication, call the clinic.   BELOW ARE SYMPTOMS THAT SHOULD BE REPORTED IMMEDIATELY:  *FEVER GREATER THAN 100.5 F  *CHILLS WITH OR WITHOUT FEVER  NAUSEA AND VOMITING THAT IS NOT CONTROLLED WITH YOUR NAUSEA MEDICATION  *UNUSUAL SHORTNESS OF BREATH  *UNUSUAL BRUISING OR BLEEDING  TENDERNESS IN MOUTH AND THROAT WITH OR WITHOUT PRESENCE OF ULCERS  *URINARY PROBLEMS  *BOWEL PROBLEMS  UNUSUAL RASH Items with * indicate a potential emergency and should be followed up as soon as possible.  Feel free to call the clinic should you have any questions or concerns. The clinic phone number is [phone].  Please show the [facility] at check-in to the Emergency Department and triage nurse.   Nanoparticle Albumin-Bound Paclitaxel injection What is this medicine? NANOPARTICLE ALBUMIN-BOUND PACLITAXEL (Na no PAHR ti kuhl al BYOO muhn-bound PAK li TAX el) is a chemotherapy drug. It targets fast dividing cells, like cancer cells, and causes these cells to die. This medicine is used to treat advanced breast cancer and advanced lung cancer. This medicine may be used for other purposes; ask your health care provider or pharmacist if you have questions. COMMON BRAND NAME(S): Abraxane What should I tell my health care provider before I take this medicine? They need to know if you have any of these conditions: -kidney disease -liver disease -low blood counts, like low platelets, red blood cells, or white blood cells -recent or ongoing radiation therapy -an unusual or allergic reaction to paclitaxel, albumin, other chemotherapy, other medicines,  foods, dyes, or preservatives -pregnant or trying to get pregnant -breast-feeding How should I use this medicine? This drug is given as an infusion into a vein. It is administered in a hospital or clinic by a specially trained health care professional. Talk to your pediatrician regarding the use of this medicine in children. Special care may be needed. Overdosage: If you think you have taken too much of this medicine contact a poison control center or emergency room at once. NOTE: This medicine is only for you. Do not share this medicine with others. What if I miss a dose? It is important not to miss your dose. Call your doctor or health care professional if you are unable to keep an appointment. What may interact with this medicine? -cyclosporine -diazepam -ketoconazole -medicines to increase blood counts like filgrastim, pegfilgrastim, sargramostim -other chemotherapy drugs like cisplatin, doxorubicin, epirubicin, etoposide, teniposide, vincristine -quinidine -testosterone -vaccines -verapamil Talk to your doctor or health care professional before taking any of these medicines: -acetaminophen -aspirin -ibuprofen -ketoprofen -naproxen This list may not describe all possible interactions. Give your health care provider a list of all the medicines, herbs, non-prescription drugs, or dietary supplements you use. Also tell them if you smoke, drink alcohol, or use illegal drugs. Some items may interact with your medicine. What should I watch for while using this medicine? Your condition will be monitored carefully while you are receiving this medicine. You will need important blood work done while you are taking this medicine. This medicine can cause serious allergic reactions. If you experience allergic reactions like skin rash, itching or hives, swelling of the face, lips, or tongue, tell your doctor or health  care professional right away. In some cases, you may be given additional  medicines to help with side effects. Follow all directions for their use. This drug may make you feel generally unwell. This is not uncommon, as chemotherapy can affect healthy cells as well as cancer cells. Report any side effects. Continue your course of treatment even though you feel ill unless your doctor tells you to stop. Call your doctor or health care professional for advice if you get a fever, chills or sore throat, or other symptoms of a cold or flu. Do not treat yourself. This drug decreases your body's ability to fight infections. Try to avoid being around people who are sick. This medicine may increase your risk to bruise or bleed. Call your doctor or health care professional if you notice any unusual bleeding. Be careful brushing and flossing your teeth or using a toothpick because you may get an infection or bleed more easily. If you have any dental work done, tell your dentist you are receiving this medicine. Avoid taking products that contain aspirin, acetaminophen, ibuprofen, naproxen, or ketoprofen unless instructed by your doctor. These medicines may hide a fever. Do not become pregnant while taking this medicine. Women should inform their doctor if they wish to become pregnant or think they might be pregnant. There is a potential for serious side effects to an unborn child. Talk to your health care professional or pharmacist for more information. Do not breast-feed an infant while taking this medicine. Men are advised not to father a child while receiving this medicine. What side effects may I notice from receiving this medicine? Side effects that you should report to your doctor or health care professional as soon as possible: -allergic reactions like skin rash, itching or hives, swelling of the face, lips, or tongue -low blood counts - This drug may decrease the number of white blood cells, red blood cells and platelets. You may be at increased risk for infections and  bleeding. -signs of infection - fever or chills, cough, sore throat, pain or difficulty passing urine -signs of decreased platelets or bleeding - bruising, pinpoint red spots on the skin, black, tarry stools, nosebleeds -signs of decreased red blood cells - unusually weak or tired, fainting spells, lightheadedness -breathing problems -changes in vision -chest pain -high or low blood pressure -mouth sores -nausea and vomiting -pain, swelling, redness or irritation at the injection site -pain, tingling, numbness in the hands or feet -slow or irregular heartbeat -swelling of the ankle, feet, hands Side effects that usually do not require medical attention (report to your doctor or health care professional if they continue or are bothersome): -aches, pains -changes in the color of fingernails -diarrhea -hair loss -loss of appetite This list may not describe all possible side effects. Call your doctor for medical advice about side effects. You may report side effects to FDA at 1-800-FDA-1088. Where should I keep my medicine? This drug is given in a hospital or clinic and will not be stored at home. NOTE: This sheet is a summary. It may not cover all possible information. If you have questions about this medicine, talk to your doctor, pharmacist, or health care provider.  2018 Elsevier/Gold Standard (2015-02-20 10:05:20)   Potassium Content of Foods Potassium is a mineral found in many foods and drinks. It helps keep fluids and minerals balanced in your body and affects how steadily your heart beats. Potassium also helps control your blood pressure and keep your muscles and nervous system healthy. Certain  health conditions and medicines may change the balance of potassium in your body. When this happens, you can help balance your level of potassium through the foods that you do or do not eat. Your health care provider or dietitian may recommend an amount of potassium that you should have each  day. The following lists of foods provide the amount of potassium (in parentheses) per serving in each item. High in potassium The following foods and beverages have 200 mg or more of potassium per serving:  Apricots, 2 raw or 5 dry (200 mg).  Artichoke, 1 medium (345 mg).  Avocado, raw,  each (245 mg).  Banana, 1 medium (425 mg).  Beans, lima, or baked beans, canned,  cup (280 mg).  Beans, white, canned,  cup (595 mg).  Beef roast, 3 oz (320 mg).  Beef, ground, 3 oz (270 mg).  Beets, raw or cooked,  cup (260 mg).  Bran muffin, 2 oz (300 mg).  Broccoli,  cup (230 mg).  Brussels sprouts,  cup (250 mg).  Cantaloupe,  cup (215 mg).  Cereal, 100% bran,  cup (200-400 mg).  Cheeseburger, single, fast food, 1 each (225-400 mg).  Chicken, 3 oz (220 mg).  Clams, canned, 3 oz (535 mg).  Crab, 3 oz (225 mg).  Dates, 5 each (270 mg).  Dried beans and peas,  cup (300-475 mg).  Figs, dried, 2 each (260 mg).  Fish: halibut, tuna, cod, snapper, 3 oz (480 mg).  Fish: salmon, haddock, swordfish, perch, 3 oz (300 mg).  Fish, tuna, canned 3 oz (200 mg).  [place] fries, fast food, 3 oz (470 mg).  Granola with fruit and nuts,  cup (200 mg).  Grapefruit juice,  cup (200 mg).  Greens, beet,  cup (655 mg).  Honeydew melon,  cup (200 mg).  Kale, raw, 1 cup (300 mg).  Kiwi, 1 medium (240 mg).  Kohlrabi, rutabaga, parsnips,  cup (280 mg).  Lentils,  cup (365 mg).  Mango, 1 each (325 mg).  Milk, chocolate, 1 cup (420 mg).  Milk: nonfat, low-fat, whole, buttermilk, 1 cup (350-380 mg).  Molasses, 1 Tbsp (295 mg).  Mushrooms,  cup (280) mg.  Nectarine, 1 each (275 mg).  Nuts: almonds, peanuts, hazelnuts, [place], cashew, mixed, 1 oz (200 mg).  Nuts, pistachios, 1 oz (295 mg).  Orange, 1 each (240 mg).  Orange juice,  cup (235 mg).  Papaya, medium,  fruit (390 mg).  Peanut butter, chunky, 2 Tbsp (240 mg).  Peanut butter, smooth, 2 Tbsp (210  mg).  Pear, 1 medium (200 mg).  Pomegranate, 1 whole (400 mg).  Pomegranate juice,  cup (215 mg).  Pork, 3 oz (350 mg).  Potato chips, salted, 1 oz (465 mg).  Potato, baked with skin, 1 medium (925 mg).  Potatoes, boiled,  cup (255 mg).  Potatoes, mashed,  cup (330 mg).  Prune juice,  cup (370 mg).  Prunes, 5 each (305 mg).  Pudding, chocolate,  cup (230 mg).  Pumpkin, canned,  cup (250 mg).  Raisins, seedless,  cup (270 mg).  Seeds, sunflower or pumpkin, 1 oz (240 mg).  Soy milk, 1 cup (300 mg).  Spinach,  cup (420 mg).  Spinach, canned,  cup (370 mg).  Sweet potato, baked with skin, 1 medium (450 mg).  Swiss chard,  cup (480 mg).  Tomato or vegetable juice,  cup (275 mg).  Tomato sauce or puree,  cup (400-550 mg).  Tomato, raw, 1 medium (290 mg).  Tomatoes, canned,  cup (200-300  mg).  [place], 3 oz (250 mg).  Wheat germ, 1 oz (250 mg).  Winter squash,  cup (250 mg).  Yogurt, plain or fruited, 6 oz (260-435 mg).  Zucchini,  cup (220 mg).  Moderate in potassium The following foods and beverages have 50-200 mg of potassium per serving:  Apple, 1 each (150 mg).  Apple juice,  cup (150 mg).  Applesauce,  cup (90 mg).  Apricot nectar,  cup (140 mg).  Asparagus, small spears,  cup or 6 spears (155 mg).  Bagel, cinnamon raisin, 1 each (130 mg).  Bagel, egg or plain, 4 in., 1 each (70 mg).  Beans, green,  cup (90 mg).  Beans, yellow,  cup (190 mg).  Beer, regular, 12 oz (100 mg).  Beets, canned,  cup (125 mg).  Blackberries,  cup (115 mg).  Blueberries,  cup (60 mg).  Bread, whole wheat, 1 slice (70 mg).  Broccoli, raw,  cup (145 mg).  Cabbage,  cup (150 mg).  Carrots, cooked or raw,  cup (180 mg).  Cauliflower, raw,  cup (150 mg).  Celery, raw,  cup (155 mg).  Cereal, bran flakes, cup (120-150 mg).  Cheese, cottage,  cup (110 mg).  Cherries, 10 each (150 mg).  Chocolate, 1 oz bar (165  mg).  Coffee, brewed 6 oz (90 mg).  Corn,  cup or 1 ear (195 mg).  Cucumbers,  cup (80 mg).  Egg, large, 1 each (60 mg).  Eggplant,  cup (60 mg).  Endive, raw, cup (80 mg).  English muffin, 1 each (65 mg).  Fish, orange roughy, 3 oz (150 mg).  Frankfurter, beef or pork, 1 each (75 mg).  Fruit cocktail,  cup (115 mg).  Grape juice,  cup (170 mg).  Grapefruit,  fruit (175 mg).  Grapes,  cup (155 mg).  Greens: kale, turnip, collard,  cup (110-150 mg).  Ice cream or frozen yogurt, chocolate,  cup (175 mg).  Ice cream or frozen yogurt, vanilla,  cup (120-150 mg).  Lemons, limes, 1 each (80 mg).  Lettuce, all types, 1 cup (100 mg).  Mixed vegetables,  cup (150 mg).  Mushrooms, raw,  cup (110 mg).  Nuts: walnuts, pecans, or macadamia, 1 oz (125 mg).  Oatmeal,  cup (80 mg).  Okra,  cup (110 mg).  Onions, raw,  cup (120 mg).  Peach, 1 each (185 mg).  Peaches, canned,  cup (120 mg).  Pears, canned,  cup (120 mg).  Peas, green, frozen,  cup (90 mg).  Peppers, green,  cup (130 mg).  Peppers, red,  cup (160 mg).  Pineapple juice,  cup (165 mg).  Pineapple, fresh or canned,  cup (100 mg).  Plums, 1 each (105 mg).  Pudding, vanilla,  cup (150 mg).  Raspberries,  cup (90 mg).  Rhubarb,  cup (115 mg).  Rice, wild,  cup (80 mg).  Shrimp, 3 oz (155 mg).  Spinach, raw, 1 cup (170 mg).  Strawberries,  cup (125 mg).  Summer squash  cup (175-200 mg).  Swiss chard, raw, 1 cup (135 mg).  Tangerines, 1 each (140 mg).  Tea, brewed, 6 oz (65 mg).  Turnips,  cup (140 mg).  Watermelon,  cup (85 mg).  Wine, red, table, 5 oz (180 mg).  Wine, white, table, 5 oz (100 mg).  Low in potassium The following foods and beverages have less than 50 mg of potassium per serving.  Bread, white, 1 slice (30 mg).  Carbonated beverages, 12 oz (less than 5 mg).  Cheese, 1 oz (20-30 mg).  Cranberries,  cup (45 mg).  Cranberry  juice cocktail,  cup (20 mg).  Fats and oils, 1 Tbsp (less than 5 mg).  Hummus, 1 Tbsp (32 mg).  Nectar: papaya, mango, or pear,  cup (35 mg).  Rice, white or brown,  cup (50 mg).  Spaghetti or macaroni,  cup cooked (30 mg).  Tortilla, flour or corn, 1 each (50 mg).  Waffle, 4 in., 1 each (50 mg).  Water chestnuts,  cup (40 mg).  This information is not intended to replace advice given to you by your health care provider. Make sure you discuss any questions you have with your health care provider. Document Released: 12/02/2004 Document Revised: 09/26/2015 Document Reviewed: 03/17/2013 Elsevier Interactive Patient Education  2018 [employer].  Jaundice, Adult Jaundice is a yellowish discoloration of the skin, the whites of the eyes, and mucous membranes. Jaundice can be a sign that the liver or the bile system is not working normally. What are the causes? This condition is caused by an increased level of bilirubin in the blood. Bilirubin is a substance that is produced by the normal breakdown of red blood cells. Conditions and activities that can cause an increase in the bilirubin level include:  Viral hepatitis.  Gallstones or other conditions, such as a tumor, that can cause a blockage of bile ducts.  Excessive use of alcohol.  Other liver diseases, such as cirrhosis.  Certain cancers.  Certain infections.  Certain genetic syndromes.  Certain medicines.  What are the signs or symptoms? Symptoms of this condition include:  Yellow color to the skin, the whites of the eyes, or mucous membranes.  Dark brown urine.  Stomach pain.  Light or clay-colored stool.  Itchy skin (pruritus).  How is this diagnosed? This condition is diagnosed with a medical history, physical exam, and blood tests. You may have additional tests to determine what is causing your bilirubin level to increase. How is this treated? Treatment for jaundice depends on the underlying  condition. Treatment may include:  Stopping the use of a certain medicine.  Fluids that are given through an IV tube that is inserted into one of your veins.  Medicines to treat pruritus.  Surgery, if there is blockage of the bile ducts.  Follow these instructions at home:  Drink enough fluid to keep your urine clear or pale yellow.  Do not drink alcohol.  Take medicines only as directed by your health care provider.  Keep all follow-up visits as directed by your health care provider. This is important.  You may use skin lotion to relieve itching. Contact a health care provider if:  You have a fever. Get help right away if:  Your symptoms suddenly get worse.  You have symptoms for more than 72 hours.  Your pain gets worse.  You vomit repeatedly.  You become weak or confused.  You develop a severe headache.  You become severely dehydrated. Signs of severe dehydration include: ? A very dry mouth. ? A rapid, weak pulse. ? Rapid breathing. ? Blue lips. This information is not intended to replace advice given to you by your health care provider. Make sure you discuss any questions you have with your health care provider. Document Released: 04/20/2005 Document Revised: 09/26/2015 Document Reviewed: 04/16/2014 Elsevier Interactive Patient Education  [employer].

## 2018-01-07 NOTE — Telephone Encounter (Signed)
Appts cancelled per 9/6 los. Patients daughter did not want updated schedule or AVS

## 2018-01-14 ENCOUNTER — Ambulatory Visit: Payer: Medicare Other

## 2018-01-14 ENCOUNTER — Other Ambulatory Visit: Payer: Medicare Other

## 2018-01-14 ENCOUNTER — Ambulatory Visit: Payer: Medicare Other | Admitting: Nurse Practitioner

## 2018-01-15 ENCOUNTER — Other Ambulatory Visit: Payer: Self-pay | Admitting: Hematology

## 2018-01-21 ENCOUNTER — Inpatient Hospital Stay: Payer: Medicare Other | Admitting: Nurse Practitioner

## 2018-01-21 ENCOUNTER — Inpatient Hospital Stay: Payer: Medicare Other

## 2018-01-21 ENCOUNTER — Telehealth: Payer: Self-pay | Admitting: Nurse Practitioner

## 2018-01-21 ENCOUNTER — Encounter: Payer: Self-pay | Admitting: Nurse Practitioner

## 2018-01-21 VITALS — BP 136/89 | HR 74 | Temp 97.7°F | Resp 18 | Ht 61.0 in | Wt 133.8 lb

## 2018-01-21 DIAGNOSIS — Z171 Estrogen receptor negative status [ER-]: Secondary | ICD-10-CM

## 2018-01-21 DIAGNOSIS — M8580 Other specified disorders of bone density and structure, unspecified site: Secondary | ICD-10-CM | POA: Diagnosis not present

## 2018-01-21 DIAGNOSIS — D701 Agranulocytosis secondary to cancer chemotherapy: Secondary | ICD-10-CM | POA: Diagnosis not present

## 2018-01-21 DIAGNOSIS — C50411 Malignant neoplasm of upper-outer quadrant of right female breast: Secondary | ICD-10-CM

## 2018-01-21 DIAGNOSIS — Z5111 Encounter for antineoplastic chemotherapy: Secondary | ICD-10-CM | POA: Diagnosis not present

## 2018-01-21 DIAGNOSIS — E119 Type 2 diabetes mellitus without complications: Secondary | ICD-10-CM

## 2018-01-21 DIAGNOSIS — R748 Abnormal levels of other serum enzymes: Secondary | ICD-10-CM

## 2018-01-21 DIAGNOSIS — C50412 Malignant neoplasm of upper-outer quadrant of left female breast: Secondary | ICD-10-CM

## 2018-01-21 DIAGNOSIS — Z170 Estrogen receptor positive status [ER+]: Principal | ICD-10-CM

## 2018-01-21 DIAGNOSIS — Z23 Encounter for immunization: Secondary | ICD-10-CM

## 2018-01-21 DIAGNOSIS — Z95828 Presence of other vascular implants and grafts: Secondary | ICD-10-CM

## 2018-01-21 LAB — CMP (CANCER CENTER ONLY)
ALT: 22 U/L (ref 0–44)
AST: 20 U/L (ref 15–41)
Albumin: 3.4 g/dL — ABNORMAL LOW (ref 3.5–5.0)
Alkaline Phosphatase: 146 U/L — ABNORMAL HIGH (ref 38–126)
Anion gap: 11 (ref 5–15)
BUN: 20 mg/dL (ref 8–23)
CO2: 25 mmol/L (ref 22–32)
Calcium: 9.7 mg/dL (ref 8.9–10.3)
Chloride: 105 mmol/L (ref 98–111)
Creatinine: 1.11 mg/dL — ABNORMAL HIGH (ref 0.44–1.00)
GFR, Est AFR Am: 53 mL/min — ABNORMAL LOW (ref >60)
GFR, Estimated: 45 mL/min — ABNORMAL LOW (ref >60)
Glucose, Bld: 125 mg/dL — ABNORMAL HIGH (ref 70–99)
Potassium: 4.3 mmol/L (ref 3.5–5.1)
Sodium: 141 mmol/L (ref 135–145)
Total Bilirubin: 0.4 mg/dL (ref 0.3–1.2)
Total Protein: 6.9 g/dL (ref 6.5–8.1)

## 2018-01-21 LAB — CBC WITH DIFFERENTIAL (CANCER CENTER ONLY)
BASOS PCT: 1 %
Basophils Absolute: 0.1 10*3/uL (ref 0.0–0.1)
EOS ABS: 0.1 10*3/uL (ref 0.0–0.5)
EOS PCT: 1 %
HCT: 34.9 % (ref 34.8–46.6)
Hemoglobin: 11.3 g/dL — ABNORMAL LOW (ref 11.6–15.9)
Lymphocytes Relative: 21 %
Lymphs Abs: 1.3 10*3/uL (ref 0.9–3.3)
MCH: 31.9 pg (ref 25.1–34.0)
MCHC: 32.4 g/dL (ref 31.5–36.0)
MCV: 98.3 fL (ref 79.5–101.0)
MONOS PCT: 14 %
Monocytes Absolute: 0.9 10*3/uL (ref 0.1–0.9)
Neutro Abs: 4 10*3/uL (ref 1.5–6.5)
Neutrophils Relative %: 63 %
PLATELETS: 235 10*3/uL (ref 145–400)
RBC: 3.55 MIL/uL — ABNORMAL LOW (ref 3.70–5.45)
RDW: 17.7 % — AB (ref 11.2–14.5)
WBC Count: 6.4 10*3/uL (ref 3.9–10.3)

## 2018-01-21 MED ORDER — SODIUM CHLORIDE 0.9 % IV SOLN
Freq: Once | INTRAVENOUS | Status: AC
  Administered 2018-01-21: 13:00:00 via INTRAVENOUS
  Filled 2018-01-21: qty 250

## 2018-01-21 MED ORDER — HEPARIN SOD (PORK) LOCK FLUSH 100 UNIT/ML IV SOLN
500.0000 [IU] | Freq: Once | INTRAVENOUS | Status: AC | PRN
  Administered 2018-01-21: 500 [IU]
  Filled 2018-01-21: qty 5

## 2018-01-21 MED ORDER — INFLUENZA VAC SPLIT HIGH-DOSE 0.5 ML IM SUSY
0.5000 mL | PREFILLED_SYRINGE | Freq: Once | INTRAMUSCULAR | Status: AC
  Administered 2018-01-21: 0.5 mL via INTRAMUSCULAR
  Filled 2018-01-21: qty 0.5

## 2018-01-21 MED ORDER — PACLITAXEL PROTEIN-BOUND CHEMO INJECTION 100 MG
70.0000 mg/m2 | Freq: Once | INTRAVENOUS | Status: AC
  Administered 2018-01-21: 125 mg via INTRAVENOUS
  Filled 2018-01-21: qty 25

## 2018-01-21 MED ORDER — PROCHLORPERAZINE MALEATE 10 MG PO TABS
10.0000 mg | ORAL_TABLET | Freq: Once | ORAL | Status: AC
  Administered 2018-01-21: 10 mg via ORAL

## 2018-01-21 MED ORDER — LIDOCAINE-PRILOCAINE 2.5-2.5 % EX CREA: 1.0000 "application " | TOPICAL_CREAM | CUTANEOUS | 3 refills | Status: DC | PRN

## 2018-01-21 MED ORDER — SODIUM CHLORIDE 0.9% FLUSH
10.0000 mL | Freq: Once | INTRAVENOUS | Status: AC
  Administered 2018-01-21: 10 mL via INTRAVENOUS
  Filled 2018-01-21: qty 10

## 2018-01-21 MED ORDER — SODIUM CHLORIDE 0.9% FLUSH
10.0000 mL | INTRAVENOUS | Status: DC | PRN
  Administered 2018-01-21: 10 mL
  Filled 2018-01-21: qty 10

## 2018-01-21 MED ORDER — PROCHLORPERAZINE MALEATE 10 MG PO TABS
ORAL_TABLET | ORAL | Status: AC
  Filled 2018-01-21: qty 1

## 2018-01-21 NOTE — Patient Instructions (Signed)
Implanted Port Home Guide An implanted port is a type of central line that is placed under the skin. Central lines are used to provide IV access when treatment or nutrition needs to be given through a person's veins. Implanted ports are used for long-term IV access. An implanted port may be placed because:  You need IV medicine that would be irritating to the small veins in your hands or arms.  You need long-term IV medicines, such as antibiotics.  You need IV nutrition for a long period.  You need frequent blood draws for lab tests.  You need dialysis.  Implanted ports are usually placed in the chest area, but they can also be placed in the upper arm, the abdomen, or the leg. An implanted port has two main parts:  Reservoir. The reservoir is round and will appear as a small, raised area under your skin. The reservoir is the part where a needle is inserted to give medicines or draw blood.  Catheter. The catheter is a thin, flexible tube that extends from the reservoir. The catheter is placed into a large vein. Medicine that is inserted into the reservoir goes into the catheter and then into the vein.  How will I care for my incision site? Do not get the incision site wet. Bathe or shower as directed by your health care provider. How is my port accessed? Special steps must be taken to access the port:  Before the port is accessed, a numbing cream can be placed on the skin. This helps numb the skin over the port site.  Your health care provider uses a sterile technique to access the port. ? Your health care provider must put on a mask and sterile gloves. ? The skin over your port is cleaned carefully with an antiseptic and allowed to dry. ? The port is gently pinched between sterile gloves, and a needle is inserted into the port.  Only "non-coring" port needles should be used to access the port. Once the port is accessed, a blood return should be checked. This helps ensure that the port  is in the vein and is not clogged.  If your port needs to remain accessed for a constant infusion, a clear (transparent) bandage will be placed over the needle site. The bandage and needle will need to be changed every week, or as directed by your health care provider.  Keep the bandage covering the needle clean and dry. Do not get it wet. Follow your health care provider's instructions on how to take a shower or bath while the port is accessed.  If your port does not need to stay accessed, no bandage is needed over the port.  What is flushing? Flushing helps keep the port from getting clogged. Follow your health care provider's instructions on how and when to flush the port. Ports are usually flushed with saline solution or a medicine called heparin. The need for flushing will depend on how the port is used.  If the port is used for intermittent medicines or blood draws, the port will need to be flushed: ? After medicines have been given. ? After blood has been drawn. ? As part of routine maintenance.  If a constant infusion is running, the port may not need to be flushed.  How long will my port stay implanted? The port can stay in for as long as your health care provider thinks it is needed. When it is time for the port to come out, surgery will be   done to remove it. The procedure is similar to the one performed when the port was put in. When should I seek immediate medical care? When you have an implanted port, you should seek immediate medical care if:  You notice a bad smell coming from the incision site.  You have swelling, redness, or drainage at the incision site.  You have more swelling or pain at the port site or the surrounding area.  You have a fever that is not controlled with medicine.  This information is not intended to replace advice given to you by your health care provider. Make sure you discuss any questions you have with your health care provider. Document  Released: 04/20/2005 Document Revised: 09/26/2015 Document Reviewed: 12/26/2012 Elsevier Interactive Patient Education  2017 Elsevier Inc.  

## 2018-01-21 NOTE — Telephone Encounter (Signed)
No 9/20 los.  

## 2018-01-21 NOTE — Progress Notes (Signed)
Adrienne [name]  [phone] [phone]  Clinic Follow up Note   Patient Care Team: [name], NP as PCP - General (Family Medicine) Lelon Perla, MD as PCP - Cardiology (Cardiology) 01/21/2018  SUMMARY OF ONCOLOGIC HISTORY: Oncology History   Cancer Staging Breast cancer of upper-outer quadrant of right female breast [facility]) Staging form: Breast, AJCC 8th Edition - Clinical stage from 09/22/2017: Stage IB (cT1b, cN0, cM0, G2, ER-, PR-, HER2-) - Signed by Truitt Merle, MD on 10/08/2017  Breast cancer, left breast [facility]) Staging form: Breast, AJCC 7th Edition - Clinical stage from 09/12/2012: Stage IA (T1b, N0, M0) - Unsigned        Breast cancer, left breast ([place])   08/25/2012 Receptors her2    ER 100% positive, PR 88% positive, HER-2 negative, Ki-67 16%    08/30/2012 Initial Diagnosis    Breast cancer, left breast    09/12/2012 Pathology Results    T1bN0 Grade 1 invasive ductal carcinoma, and DCIS.    09/12/2012 Surgery    Left breast lumpectomy and sentinel lymph node biopsy, negative margins.    11/09/2012 - 12/13/2012 Radiation Therapy    Adjuvant breast radiation    12/2012 - 11/2017 Anti-estrogen oral therapy    Anastrozole 1 mg daily, switched to Aromasin 25 mg daily in Jan 2015 due to diarrhea. Her Exemestane was switched to letrozole in 08/2017 due to high copay. She completed her 5 years in 11/2017.     09/01/2016 Imaging    [name] Outside films Breast form [facility] 09/01/2016 IMPRESSION: Probably Benign 1. No significant change in oval hypoechoic masses in the left breast at 11:00 2 cm from the nipple and 10:30 4 cm from the nipple. Both of these masses resemble the area of fat necrosis previously biopsies at 2:00. In addition, both masses have developing central benign or dystrophic appearing calcifications and are favored to be other areas of fat necrosis. A 6 month follow-up left breast mammogram and ultrasound is recommended.    09/01/2016  Mammogram    HM Mammogram from [facility] 09/01/16 IMPRESSION  Incomplete - additional imaging evaluation needed Ultrasound of the upper medial left breast mass is recommended.     03/09/2017 Mammogram    Previous lumpectomy changes in the upper outer left breast anterior depth with stable associated dystrophic calcifications.  Biopsy clip at the 2:00 in the left breast near the lumpectomy site corresponding to previously biopsied benign fat necrosis.  Persistent round mass in the upper medial left breast with benign-appearing central round calcification.  No significant masses, calcifications, or other findings are seen in the breast  Impression: ultrasound is recommended for the left breast    03/09/2017 Breast US    Left breast ultrasound impression:  No significant change in oval hypoechoic masses in the left breast at 11:00 2 cm from the nipple and 10:30 4 cm from the nipple.  Both of these masses resemble the area of fat necrosis previously biopsied at 2:00.  In addition, both masses have developing central benign or dystrophic appearing calcifications and are favored to be other areas of fat necrosis.  A 83-monthfollow-up bilateral mammogram and left breast ultrasound is recommended.    09/14/2017 Breast [place]   Breast [place] 09[place]14[place]17 at [facility]  IMPRESSION:  The 1 cm oval mass in the left breast at 9:30 posterior depth is highly suggestive of malignancy. An Ultrasound guided biopsy is recommended.  The 0.8 cm round mass in the left breast at 11-12  o'clock anterior depth is suspicious of malignancy. An ultrasound guided biopsy is recommended.     09/22/2017 Pathology Results    Diagnosis 1. Breast, right, needle core biopsy - INVASIVE DUCTAL CARCINOMA, MSBR GRADE II/III. - DUCTAL CARCINOMA IN SITU WITH NECROSIS. 2. Breast, left, needle core biopsy - FAT NECROSIS. - NO EVIDENCE OF MALIGNANCY.    10/17/2017 Genetic Testing    Negative genetic testing on the Multicancer panel.  The  Multi-Gene Panel offered by [facility] includes sequencing and/or deletion duplication testing of the following 83 genes: ALK, APC, ATM, AXIN2,BAP1,  BARD1, BLM, BMPR1A, BRCA1, BRCA2, BRIP1, CASR, CDC73, CDH1, CDK4, CDKN1B, CDKN1C, CDKN2A (p14ARF), CDKN2A (p16INK4a), CEBPA, CHEK2, CTNNA1, DICER1, DIS3L2, EGFR (c.2369C>T, p.Thr790Met variant only), EPCAM (Deletion/duplication testing only), FH, FLCN, GATA2, GPC3, GREM1 (Promoter region deletion/duplication testing only), HOXB13 (c.251G>A, p.Gly84Glu), HRAS, KIT, MAX, MEN1, MET, MITF (c.952G>A, p.Glu318Lys variant only), MLH1, MSH2, MSH3, MSH6, MUTYH, NBN, NF1, NF2, NTHL1, PALB2, PDGFRA, PHOX2B, PMS2, POLD1, POLE, POT1, PRKAR1A, PTCH1, PTEN, RAD50, RAD51C, RAD51D, RB1, RECQL4, RET, RUNX1, SDHAF2, SDHA (sequence changes only), SDHB, SDHC, SDHD, SMAD4, SMARCA4, SMARCB1, SMARCE1, STK11, SUFU, TERT, TERT, TMEM127, TP53, TSC1, TSC2, VHL, WRN and WT1.  The report date is October 17, 2017.     Breast cancer of upper-outer quadrant of right female breast ([place])   09/14/2017 Mammogram    Diagnostic mammogram at [facility]  IMPRESSION:  The new 0.9 cm oval high density mass in the right breast posterior depth superior region seen on the mediolateral oblique view only is indeterminate. The 1.1 cm focal asymmetry in the left breast central to the nipple anterior depth is indeterminate.     09/14/2017 Imaging    [name] Bilateral at [facility] IMPRESSION: The 1 cm oval mass in the right breast at 9:30 posterior depth is highly suggestive of malignancy. The 0.8 cm round mass in the left breast at 11-12 o'clock anterior depth is suspicious of malignancy.     09/22/2017 Cancer Staging    Staging form: Breast, AJCC 8th Edition - Clinical stage from 09/22/2017: Stage IB (cT1b, cN0, cM0, G2, ER-, PR-, HER2-) - Signed by Truitt Merle, MD on 10/08/2017    09/22/2017 Pathology Results    Bilateral needle core biopsy 1. Breast, right, needle core biopsy - INVASIVE DUCTAL CARCINOMA, MSBR GRADE  II/III. - DUCTAL CARCINOMA IN SITU WITH NECROSIS. 2. Breast, left, needle core biopsy - FAT NECROSIS. - NO EVIDENCE OF MALIGNANCY.    09/25/2017 Receptors her2    Estrogen Receptor: 0 Progesterone 0 HER2: Negative. Ki-67: 80%     10/08/2017 Initial Diagnosis    Breast cancer of upper-outer quadrant of right female breast ([place])    10/26/2017 Surgery    BREAST LUMPECTOMY WITH RADIOACTIVE SEED AND SENTINEL LYMPH NODE BIOPSY by Dr. Excell Seltzer  10/26/17    10/26/2017 Pathology Results    Diagnosis 10/26/17 1. Breast, lumpectomy, Right - INVASIVE DUCTAL CARCINOMA, GRADE 2, SPANNING 1 CM. - HIGH GRADE DUCTAL CARCINOMA IN SITU WITH NECROSIS. - FINAL RESECTION MARGINS (PARTS #2, 3, 4) ARE NEGATIVE. - BIOPSY SITE. - SEE ONCOLOGY TABLE. 2. Breast, excision, additional anterior margin- 2 pieces right - BENIGN BREAST TISSUE. 3. Breast, excision, Right unoriented anterior margin - BENIGN BREAST TISSUE. 4. Breast, excision, Right deep margin - BENIGN BREAST TISSUE. 5. Lymph node, sentinel, biopsy, Right Axillary - ONE OF ONE LYMPH NODES NEGATIVE FOR CARCINOMA (0/1).    10/26/2017 Cancer Staging    Staging form: Breast, AJCC 8th Edition - Pathologic stage from 10/26/2017: Stage IB (pT1b,  pN0, cM0, G2, ER-, PR-, HER2-) - Signed by Truitt Merle, MD on 11/12/2017    12/03/2017 -  Chemotherapy    Weekly Abraxane starting 12/03/17   CURRENT THERAPY:   -Adjuvant weekly Abraxane for 12 weeks starting on 12/03/17  INTERVAL HISTORY: Adrienne [name] returns with her daughter for f/u and chemo as scheduled. She completed 6th weekly abraxane on 9/6. She had increased fatigue because she also had PAC placed that week, and needed more time to recover. She feels improved today. Taste is decreased but eating well. Mucositis resolved using oragel. Nausea is well controlled with compazine. Denies v/c/d. During the week she was more fatigued she had occasional dyspnea with exertion, which has also now improved. Denies cough,  chest pain. No neuropathy.     MEDICAL HISTORY:  Past Medical History:  Diagnosis Date  . Allergy   . Atrial fibrillation ([place])   . Breast cancer ([place]) 08/25/12   Invasive ductal ca,DCIS  . Diabetes mellitus    type II  . Family history of cancer   . Gout   . Hearing loss   . History of breast cancer   . History of frequent urinary tract infections   . Hx of radiation therapy 11/22/12- 12/19/12   breast 4250 cGy 17 sessions, left breast boost 750 cGy 3 sessions  . Hyperlipidemia   . Hypertension   . Osteopenia     SURGICAL HISTORY: Past Surgical History:  Procedure Laterality Date  . ABDOMINAL HYSTERECTOMY Bilateral 1999   w/b/l salpingo-oopherectomy  . APPENDECTOMY    . BREAST LUMPECTOMY WITH NEEDLE LOCALIZATION AND AXILLARY SENTINEL LYMPH NODE BX Left 09/12/2012   Procedure: LEFT NEEDLE LOCALIZATION BREAST LUMPECTOMY TION AND AXILLARY SENTINEL LYMPH NODE BX;  Surgeon: Edward Jolly, MD;  Location: [place];  Service: General;  Laterality: Left;  . BREAST LUMPECTOMY WITH RADIOACTIVE SEED AND SENTINEL LYMPH NODE BIOPSY Right 10/26/2017   Procedure: BREAST LUMPECTOMY WITH RADIOACTIVE SEED AND SENTINEL LYMPH NODE BIOPSY;  Surgeon: Excell Seltzer, MD;  Location: [place];  Service: General;  Laterality: Right;  . BREAST SURGERY  1990's   right- fibroid cyst  . CHOLECYSTECTOMY    . SEPTOPLASTY    . TONSILLECTOMY      I have reviewed the social history and family history with the patient and they are unchanged from previous note.  ALLERGIES:  is allergic to penicillins; prednisone; and radiaplexrx [woun'dres hydrogel wound dress].  MEDICATIONS:  Current Outpatient Medications  Medication Sig Dispense Refill  . ACCU-CHEK AVIVA PLUS test strip USE TO TEST TWICE DAILY **ICD-10 CODE E13.9** 200 each 3  . allopurinol (ZYLOPRIM) 300 MG tablet TAKE 1 TABLET (300 MG TOTAL) BY MOUTH DAILY. **DNF 04/06/15** 90 tablet 3  . chlorpheniramine (CHLOR-TRIMETON) 4 MG  tablet Take 4 mg 2 (two) times daily as needed by mouth for allergies.    Marland Kitchen diltiazem (CARDIZEM CD) 360 MG 24 hr capsule TAKE 1 CAPSULE (360 MG TOTAL) BY MOUTH DAILY. 90 capsule 3  . ELIQUIS 5 MG TABS tablet TAKE 1 TABLET BY MOUTH TWICE A DAY 180 tablet 1  . fish oil-omega-3 fatty acids 1000 MG capsule Take 1 g by mouth daily.    . magic mouthwash w/lidocaine SOLN Take 5 mLs by mouth 3 (three) times daily. Swish and spit 240 mL 0  . metFORMIN (GLUCOPHAGE) 500 MG tablet Take 1 tablet (500 mg total) by mouth 2 (two) times daily. 180 tablet 3  . Multiple Vitamin (MULTIVITAMIN) tablet Take 1 tablet  by mouth daily.    . ondansetron (ZOFRAN) 8 MG tablet Take 1 tablet (8 mg total) by mouth 2 (two) times daily as needed (Nausea or vomiting). 30 tablet 1  . OVER THE COUNTER MEDICATION Take by mouth 2 (two) times daily. Presavision- for macular degeneration prevention    . prochlorperazine (COMPAZINE) 10 MG tablet Take 1 tablet (10 mg total) by mouth every 6 (six) hours as needed (Nausea or vomiting). 30 tablet 1  . triamcinolone lotion (KENALOG) 0.1 % Apply 1 application topically 3 (three) times daily. 60 mL 1  . VITAMIN D, ERGOCALCIFEROL, PO Take by mouth.    Marland Kitchen atenolol (TENORMIN) 25 MG tablet TAKE 1 TABLET (25 MG TOTAL) BY MOUTH DAILY. 30 tablet 10  . lidocaine-prilocaine (EMLA) cream Apply 1 application topically as needed. 30 g 3  . loperamide (IMODIUM) 2 MG capsule Take 2 mg by mouth as needed for diarrhea or loose stools.     No current facility-administered medications for this visit.     PHYSICAL EXAMINATION: ECOG PERFORMANCE STATUS: 0 - Asymptomatic  Vitals:   01/21/18 1207  BP: 136/89  Pulse: 74  Resp: 18  Temp: 97.7 F (36.5 C)  SpO2: 98%   Filed Weights   01/21/18 1207  Weight: 133 lb 12.8 oz (60.7 kg)    GENERAL:alert, no distress and comfortable SKIN: no rashes or significant lesions EYES: , sclera clear OROPHARYNX:no thrush or ulcers  LYMPH:  no palpable cervical or  supraclavicular lymphadenopathy in the cervical, axillary or inguinal LUNGS: clear to auscultation with normal breathing effort HEART: Afib. No lower extremity edema ABDOMEN:abdomen soft, non-tender and normal bowel sounds Musculoskeletal:no cyanosis of digits and no clubbing  NEURO: alert & oriented x 3 with fluent speech, no focal motor/sensory deficits Breast exam deferred  PAC without erythema   LABORATORY DATA:  I have reviewed the data as listed CBC Latest Ref Rng & Units 01/21/2018 01/07/2018 12/31/2017  WBC 3.9 - 10.3 K/uL 6.4 5.9 4.1  Hemoglobin 11.6 - 15.9 g/dL 11.3(L) 10.3(L) 11.1(L)  Hematocrit 34.8 - 46.6 % 34.9 31.0(L) 33.4(L)  Platelets 145 - 400 K/uL 235 249 188     CMP Latest Ref Rng & Units 01/21/2018 01/07/2018 12/31/2017  Glucose 70 - 99 mg/dL 125(H) 147(H) 157(H)  BUN 8 - 23 mg/dL '20 22 16  '$ Creatinine 0.44 - 1.00 mg/dL 1.11(H) 1.05(H) 1.01(H)  Sodium 135 - 145 mmol/L 141 142 142  Potassium 3.5 - 5.1 mmol/L 4.3 3.4(L) 3.4(L)  Chloride 98 - 111 mmol/L 105 109 105  CO2 22 - 32 mmol/L '25 23 25  '$ Calcium 8.9 - 10.3 mg/dL 9.7 8.8(L) 10.0  Total Protein 6.5 - 8.1 g/dL 6.9 6.8 6.8  Total Bilirubin 0.3 - 1.2 mg/dL 0.4 0.4 0.4  Alkaline Phos 38 - 126 U/L 146(H) 99 94  AST 15 - 41 U/L 20 27 72(H)  ALT 0 - 44 U/L 22 36 66(H)      RADIOGRAPHIC STUDIES: I have personally reviewed the radiological images as listed and agreed with the findings in the report. No results found.   ASSESSMENT & PLAN: 83 y.o.[place], [place] woman   1. Breast cancer of upper outer quadrant of right breast, invasive ductal carcinoma, stage IB, pT1bN0M0, grade 2, Triple Negative, Ki67: 80% 2. pT1bN0, stage IA invasive ductal carcinoma of the left breast, grade I, ER 100%, PR 88%, Ki-67 16%, HER-2/neu negative 3. Osteopenia, on vitamin D;DEXA scan on 09/20/2017 showed: Osteopenia, very high risk of fracture, 29% for major osteoporotic  fracture and 18% for hip fracture 4. Hypercalcemia 5. DM 6.  Social support 7. Chemotherapy induced neutropenia, stable with dose reduction to abraxane 70 mg/m2  8. Skin rash,suspected secondary to abraxane  9. Mucositis, grade 2, secondary to chemotherapy  Adrienne [name] appears stable. She completed cycle 2 day 15 (6th dose) abraxane. She continues to tolerate well. Her fatigue improved. She remains very active. CBC and CMP are stable, and adequate to proceed with cycle 3 day 1 today. Cr increased slightly to 1.1, I encouraged her to drink more. Alk phos is slightly elevated, possibly related to chemo. Will monitor closely. She will return in 1 week for f/u and weekly chemo.  PLAN: -Labs reviewed, proceed with cycle 3 day 1 (7th weekly dose) abraxane at 70 mg/m2 current dose  -Return in 1 week for f/u and next cycle  -Rx: EMLA for PAC  All questions were answered. The patient knows to call the clinic with any problems, questions or concerns. No barriers to learning was detected.     Alla Feeling, NP 01/21/18

## 2018-01-21 NOTE — Patient Instructions (Signed)
[facility] Discharge Instructions for Patients Receiving Chemotherapy  Today you received the following chemotherapy agents:  Abraxane.  To help prevent nausea and vomiting after your treatment, we encourage you to take your nausea medication as directed.   If you develop nausea and vomiting that is not controlled by your nausea medication, call the clinic.   BELOW ARE SYMPTOMS THAT SHOULD BE REPORTED IMMEDIATELY:  *FEVER GREATER THAN 100.5 F  *CHILLS WITH OR WITHOUT FEVER  NAUSEA AND VOMITING THAT IS NOT CONTROLLED WITH YOUR NAUSEA MEDICATION  *UNUSUAL SHORTNESS OF BREATH  *UNUSUAL BRUISING OR BLEEDING  TENDERNESS IN MOUTH AND THROAT WITH OR WITHOUT PRESENCE OF ULCERS  *URINARY PROBLEMS  *BOWEL PROBLEMS  UNUSUAL RASH Items with * indicate a potential emergency and should be followed up as soon as possible.  Feel free to call the clinic should you have any questions or concerns. The clinic phone number is [phone].  Please show the CHEMO ALERT CARD at check-in to the Emergency Department and triage nurse.   

## 2018-01-24 ENCOUNTER — Other Ambulatory Visit: Payer: Self-pay | Admitting: Cardiology

## 2018-01-28 ENCOUNTER — Inpatient Hospital Stay: Payer: Medicare Other

## 2018-01-28 ENCOUNTER — Telehealth: Payer: Self-pay | Admitting: Hematology

## 2018-01-28 ENCOUNTER — Encounter: Payer: Self-pay | Admitting: Nurse Practitioner

## 2018-01-28 ENCOUNTER — Inpatient Hospital Stay (HOSPITAL_BASED_OUTPATIENT_CLINIC_OR_DEPARTMENT_OTHER): Payer: Medicare Other | Admitting: Nurse Practitioner

## 2018-01-28 VITALS — BP 118/70 | HR 97 | Temp 97.9°F | Resp 18 | Ht 61.0 in | Wt 135.1 lb

## 2018-01-28 DIAGNOSIS — Z170 Estrogen receptor positive status [ER+]: Principal | ICD-10-CM

## 2018-01-28 DIAGNOSIS — C50411 Malignant neoplasm of upper-outer quadrant of right female breast: Secondary | ICD-10-CM

## 2018-01-28 DIAGNOSIS — E1165 Type 2 diabetes mellitus with hyperglycemia: Secondary | ICD-10-CM

## 2018-01-28 DIAGNOSIS — Z5111 Encounter for antineoplastic chemotherapy: Secondary | ICD-10-CM | POA: Diagnosis not present

## 2018-01-28 DIAGNOSIS — M8580 Other specified disorders of bone density and structure, unspecified site: Secondary | ICD-10-CM

## 2018-01-28 DIAGNOSIS — Z171 Estrogen receptor negative status [ER-]: Secondary | ICD-10-CM | POA: Diagnosis not present

## 2018-01-28 DIAGNOSIS — D701 Agranulocytosis secondary to cancer chemotherapy: Secondary | ICD-10-CM | POA: Diagnosis not present

## 2018-01-28 DIAGNOSIS — Z7901 Long term (current) use of anticoagulants: Secondary | ICD-10-CM

## 2018-01-28 DIAGNOSIS — R040 Epistaxis: Secondary | ICD-10-CM

## 2018-01-28 DIAGNOSIS — R7989 Other specified abnormal findings of blood chemistry: Secondary | ICD-10-CM

## 2018-01-28 DIAGNOSIS — R0609 Other forms of dyspnea: Secondary | ICD-10-CM

## 2018-01-28 DIAGNOSIS — Z95828 Presence of other vascular implants and grafts: Secondary | ICD-10-CM

## 2018-01-28 DIAGNOSIS — C50412 Malignant neoplasm of upper-outer quadrant of left female breast: Secondary | ICD-10-CM

## 2018-01-28 LAB — CMP (CANCER CENTER ONLY)
ALK PHOS: 111 U/L (ref 38–126)
ALT: 27 U/L (ref 0–44)
AST: 21 U/L (ref 15–41)
Albumin: 3.5 g/dL (ref 3.5–5.0)
Anion gap: 12 (ref 5–15)
BUN: 23 mg/dL (ref 8–23)
CHLORIDE: 106 mmol/L (ref 98–111)
CO2: 25 mmol/L (ref 22–32)
CREATININE: 1.19 mg/dL — AB (ref 0.44–1.00)
Calcium: 9.4 mg/dL (ref 8.9–10.3)
GFR, EST NON AFRICAN AMERICAN: 42 mL/min — AB (ref >60)
GFR, Est AFR Am: 48 mL/min — ABNORMAL LOW (ref >60)
Glucose, Bld: 235 mg/dL — ABNORMAL HIGH (ref 70–99)
Potassium: 3.7 mmol/L (ref 3.5–5.1)
Sodium: 143 mmol/L (ref 135–145)
Total Bilirubin: 0.4 mg/dL (ref 0.3–1.2)
Total Protein: 6.7 g/dL (ref 6.5–8.1)

## 2018-01-28 LAB — CBC WITH DIFFERENTIAL (CANCER CENTER ONLY)
Basophils Absolute: 0.1 10*3/uL (ref 0.0–0.1)
Basophils Relative: 2 %
EOS PCT: 2 %
Eosinophils Absolute: 0.1 10*3/uL (ref 0.0–0.5)
HCT: 31.8 % — ABNORMAL LOW (ref 34.8–46.6)
Hemoglobin: 10.5 g/dL — ABNORMAL LOW (ref 11.6–15.9)
LYMPHS ABS: 1.2 10*3/uL (ref 0.9–3.3)
LYMPHS PCT: 19 %
MCH: 32.4 pg (ref 25.1–34.0)
MCHC: 33 g/dL (ref 31.5–36.0)
MCV: 98.2 fL (ref 79.5–101.0)
MONO ABS: 0.4 10*3/uL (ref 0.1–0.9)
Monocytes Relative: 6 %
Neutro Abs: 4.3 10*3/uL (ref 1.5–6.5)
Neutrophils Relative %: 71 %
PLATELETS: 215 10*3/uL (ref 145–400)
RBC: 3.24 MIL/uL — ABNORMAL LOW (ref 3.70–5.45)
RDW: 16.8 % — AB (ref 11.2–14.5)
WBC: 6.2 10*3/uL (ref 3.9–10.3)

## 2018-01-28 MED ORDER — SODIUM CHLORIDE 0.9% FLUSH
10.0000 mL | INTRAVENOUS | Status: DC | PRN
  Administered 2018-01-28: 10 mL
  Filled 2018-01-28: qty 10

## 2018-01-28 MED ORDER — HEPARIN SOD (PORK) LOCK FLUSH 100 UNIT/ML IV SOLN
500.0000 [IU] | Freq: Once | INTRAVENOUS | Status: AC | PRN
  Administered 2018-01-28: 500 [IU]
  Filled 2018-01-28: qty 5

## 2018-01-28 MED ORDER — SODIUM CHLORIDE 0.9% FLUSH
10.0000 mL | Freq: Once | INTRAVENOUS | Status: AC
  Administered 2018-01-28: 10 mL
  Filled 2018-01-28: qty 10

## 2018-01-28 MED ORDER — PACLITAXEL PROTEIN-BOUND CHEMO INJECTION 100 MG
70.0000 mg/m2 | Freq: Once | INTRAVENOUS | Status: AC
  Administered 2018-01-28: 125 mg via INTRAVENOUS
  Filled 2018-01-28: qty 25

## 2018-01-28 MED ORDER — PROCHLORPERAZINE MALEATE 10 MG PO TABS
ORAL_TABLET | ORAL | Status: AC
  Filled 2018-01-28: qty 1

## 2018-01-28 MED ORDER — PROCHLORPERAZINE MALEATE 10 MG PO TABS
10.0000 mg | ORAL_TABLET | Freq: Once | ORAL | Status: AC
  Administered 2018-01-28: 10 mg via ORAL

## 2018-01-28 MED ORDER — SODIUM CHLORIDE 0.9 % IV SOLN
Freq: Once | INTRAVENOUS | Status: AC
  Administered 2018-01-28: 11:00:00 via INTRAVENOUS
  Filled 2018-01-28: qty 250

## 2018-01-28 NOTE — Patient Instructions (Signed)
[facility] Discharge Instructions for Patients Receiving Chemotherapy  Today you received the following chemotherapy agents:  Abraxane.  To help prevent nausea and vomiting after your treatment, we encourage you to take your nausea medication as directed.   If you develop nausea and vomiting that is not controlled by your nausea medication, call the clinic.   BELOW ARE SYMPTOMS THAT SHOULD BE REPORTED IMMEDIATELY:  *FEVER GREATER THAN 100.5 F  *CHILLS WITH OR WITHOUT FEVER  NAUSEA AND VOMITING THAT IS NOT CONTROLLED WITH YOUR NAUSEA MEDICATION  *UNUSUAL SHORTNESS OF BREATH  *UNUSUAL BRUISING OR BLEEDING  TENDERNESS IN MOUTH AND THROAT WITH OR WITHOUT PRESENCE OF ULCERS  *URINARY PROBLEMS  *BOWEL PROBLEMS  UNUSUAL RASH Items with * indicate a potential emergency and should be followed up as soon as possible.  Feel free to call the clinic should you have any questions or concerns. The clinic phone number is [phone].  Please show the CHEMO ALERT CARD at check-in to the Emergency Department and triage nurse.   

## 2018-01-28 NOTE — Progress Notes (Signed)
[place]  [phone] [phone]  Clinic Follow up Note   Patient Care Team: [name], NP as PCP - General (Family Medicine) Lelon Perla, MD as PCP - Cardiology (Cardiology) 01/28/2018  SUMMARY OF ONCOLOGIC HISTORY: Oncology History   Cancer Staging Breast cancer of upper-outer quadrant of right female breast [facility]) Staging form: Breast, AJCC 8th Edition - Clinical stage from 09/22/2017: Stage IB (cT1b, cN0, cM0, G2, ER-, PR-, HER2-) - Signed by Truitt Merle, MD on 10/08/2017  Breast cancer, left breast [facility]) Staging form: Breast, AJCC 7th Edition - Clinical stage from 09/12/2012: Stage IA (T1b, N0, M0) - Unsigned        Breast cancer, left breast ([place])   08/25/2012 Receptors her2    ER 100% positive, PR 88% positive, HER-2 negative, Ki-67 16%    08/30/2012 Initial Diagnosis    Breast cancer, left breast    09/12/2012 Pathology Results    T1bN0 Grade 1 invasive ductal carcinoma, and DCIS.    09/12/2012 Surgery    Left breast lumpectomy and sentinel lymph node biopsy, negative margins.    11/09/2012 - 12/13/2012 Radiation Therapy    Adjuvant breast radiation    12/2012 - 11/2017 Anti-estrogen oral therapy    Anastrozole 1 mg daily, switched to Aromasin 25 mg daily in Jan 2015 due to diarrhea. Her Exemestane was switched to letrozole in 08/2017 due to high copay. She completed her 5 years in 11/2017.     09/01/2016 Imaging    [name] Outside films Breast form [facility] 09/01/2016 IMPRESSION: Probably Benign 1. No significant change in oval hypoechoic masses in the left breast at 11:00 2 cm from the nipple and 10:30 4 cm from the nipple. Both of these masses resemble the area of fat necrosis previously biopsies at 2:00. In addition, both masses have developing central benign or dystrophic appearing calcifications and are favored to be other areas of fat necrosis. A 6 month follow-up left breast mammogram and ultrasound is recommended.    09/01/2016  Mammogram    HM Mammogram from [facility] 09/01/16 IMPRESSION  Incomplete - additional imaging evaluation needed Ultrasound of the upper medial left breast mass is recommended.     03/09/2017 Mammogram    Previous lumpectomy changes in the upper outer left breast anterior depth with stable associated dystrophic calcifications.  Biopsy clip at the 2:00 in the left breast near the lumpectomy site corresponding to previously biopsied benign fat necrosis.  Persistent round mass in the upper medial left breast with benign-appearing central round calcification.  No significant masses, calcifications, or other findings are seen in the breast  Impression: ultrasound is recommended for the left breast    03/09/2017 Breast US    Left breast ultrasound impression:  No significant change in oval hypoechoic masses in the left breast at 11:00 2 cm from the nipple and 10:30 4 cm from the nipple.  Both of these masses resemble the area of fat necrosis previously biopsied at 2:00.  In addition, both masses have developing central benign or dystrophic appearing calcifications and are favored to be other areas of fat necrosis.  A 34-monthfollow-up bilateral mammogram and left breast ultrasound is recommended.    09/14/2017 Breast [place]   Breast [place] 09[place]14[place]17 at [facility]  IMPRESSION:  The 1 cm oval mass in the left breast at 9:30 posterior depth is highly suggestive of malignancy. An Ultrasound guided biopsy is recommended.  The 0.8 cm round mass in the left breast at 11-12  o'clock anterior depth is suspicious of malignancy. An ultrasound guided biopsy is recommended.     09/22/2017 Pathology Results    Diagnosis 1. Breast, right, needle core biopsy - INVASIVE DUCTAL CARCINOMA, MSBR GRADE II/III. - DUCTAL CARCINOMA IN SITU WITH NECROSIS. 2. Breast, left, needle core biopsy - FAT NECROSIS. - NO EVIDENCE OF MALIGNANCY.    10/17/2017 Genetic Testing    Negative genetic testing on the Multicancer panel.  The  Multi-Gene Panel offered by [facility] includes sequencing and/or deletion duplication testing of the following 83 genes: ALK, APC, ATM, AXIN2,BAP1,  BARD1, BLM, BMPR1A, BRCA1, BRCA2, BRIP1, CASR, CDC73, CDH1, CDK4, CDKN1B, CDKN1C, CDKN2A (p14ARF), CDKN2A (p16INK4a), CEBPA, CHEK2, CTNNA1, DICER1, DIS3L2, EGFR (c.2369C>T, p.Thr790Met variant only), EPCAM (Deletion/duplication testing only), FH, FLCN, GATA2, GPC3, GREM1 (Promoter region deletion/duplication testing only), HOXB13 (c.251G>A, p.Gly84Glu), HRAS, KIT, MAX, MEN1, MET, MITF (c.952G>A, p.Glu318Lys variant only), MLH1, MSH2, MSH3, MSH6, MUTYH, NBN, NF1, NF2, NTHL1, PALB2, PDGFRA, PHOX2B, PMS2, POLD1, POLE, POT1, PRKAR1A, PTCH1, PTEN, RAD50, RAD51C, RAD51D, RB1, RECQL4, RET, RUNX1, SDHAF2, SDHA (sequence changes only), SDHB, SDHC, SDHD, SMAD4, SMARCA4, SMARCB1, SMARCE1, STK11, SUFU, TERT, TERT, TMEM127, TP53, TSC1, TSC2, VHL, WRN and WT1.  The report date is October 17, 2017.     Breast cancer of upper-outer quadrant of right female breast ([place])   09/14/2017 Mammogram    Diagnostic mammogram at [facility]  IMPRESSION:  The new 0.9 cm oval high density mass in the right breast posterior depth superior region seen on the mediolateral oblique view only is indeterminate. The 1.1 cm focal asymmetry in the left breast central to the nipple anterior depth is indeterminate.     09/14/2017 Imaging    [name] Bilateral at [facility] IMPRESSION: The 1 cm oval mass in the right breast at 9:30 posterior depth is highly suggestive of malignancy. The 0.8 cm round mass in the left breast at 11-12 o'clock anterior depth is suspicious of malignancy.     09/22/2017 Cancer Staging    Staging form: Breast, AJCC 8th Edition - Clinical stage from 09/22/2017: Stage IB (cT1b, cN0, cM0, G2, ER-, PR-, HER2-) - Signed by Truitt Merle, MD on 10/08/2017    09/22/2017 Pathology Results    Bilateral needle core biopsy 1. Breast, right, needle core biopsy - INVASIVE DUCTAL CARCINOMA, MSBR GRADE  II/III. - DUCTAL CARCINOMA IN SITU WITH NECROSIS. 2. Breast, left, needle core biopsy - FAT NECROSIS. - NO EVIDENCE OF MALIGNANCY.    09/25/2017 Receptors her2    Estrogen Receptor: 0 Progesterone 0 HER2: Negative. Ki-67: 80%     10/08/2017 Initial Diagnosis    Breast cancer of upper-outer quadrant of right female breast ([place])    10/26/2017 Surgery    BREAST LUMPECTOMY WITH RADIOACTIVE SEED AND SENTINEL LYMPH NODE BIOPSY by Dr. Excell Seltzer  10/26/17    10/26/2017 Pathology Results    Diagnosis 10/26/17 1. Breast, lumpectomy, Right - INVASIVE DUCTAL CARCINOMA, GRADE 2, SPANNING 1 CM. - HIGH GRADE DUCTAL CARCINOMA IN SITU WITH NECROSIS. - FINAL RESECTION MARGINS (PARTS #2, 3, 4) ARE NEGATIVE. - BIOPSY SITE. - SEE ONCOLOGY TABLE. 2. Breast, excision, additional anterior margin- 2 pieces right - BENIGN BREAST TISSUE. 3. Breast, excision, Right unoriented anterior margin - BENIGN BREAST TISSUE. 4. Breast, excision, Right deep margin - BENIGN BREAST TISSUE. 5. Lymph node, sentinel, biopsy, Right Axillary - ONE OF ONE LYMPH NODES NEGATIVE FOR CARCINOMA (0/1).    10/26/2017 Cancer Staging    Staging form: Breast, AJCC 8th Edition - Pathologic stage from 10/26/2017: Stage IB (pT1b,  pN0, cM0, G2, ER-, PR-, HER2-) - Signed by Truitt Merle, MD on 11/12/2017    12/03/2017 -  Chemotherapy    Weekly Abraxane starting 12/03/17   CURRENT THERAPY:  -Adjuvant weekly Abraxane for 12 weeks starting on 12/03/17   INTERVAL HISTORY: Adrienne [name] returns for follow up and next weekly cycle of abraxane as scheduled. She completed 7th dose on 9/20. She notes improvement in her fatigue. Normal appetite. She had one spontaneous "thick, heavy" nose bleed during the night 3 days ago. She had blown her nose before bed, denies injury. No congestion, fever, or chills. Does not use CPAP or nasal cannula. She is on eliquis and she skipped the next dose but has since resumed. Episode did not recur. She fluctuates between  constipation and diarrhea, takes colace and imodium as needed. No n/v or blood in stool or urine. Has mild DOE which is stable; no cough or chest pain. Denies new pain, mucositis, or neuropathy. She has not been exercising as much lately. She notes last night was pasta night at [facility] and she at a sausage biscuit this morning.    MEDICAL HISTORY:  Past Medical History:  Diagnosis Date  . Allergy   . Atrial fibrillation ([place])   . Breast cancer ([place]) 08/25/12   Invasive ductal ca,DCIS  . Diabetes mellitus    type II  . Family history of cancer   . Gout   . Hearing loss   . History of breast cancer   . History of frequent urinary tract infections   . Hx of radiation therapy 11/22/12- 12/19/12   breast 4250 cGy 17 sessions, left breast boost 750 cGy 3 sessions  . Hyperlipidemia   . Hypertension   . Osteopenia     SURGICAL HISTORY: Past Surgical History:  Procedure Laterality Date  . ABDOMINAL HYSTERECTOMY Bilateral 1999   w/b/l salpingo-oopherectomy  . APPENDECTOMY    . BREAST LUMPECTOMY WITH NEEDLE LOCALIZATION AND AXILLARY SENTINEL LYMPH NODE BX Left 09/12/2012   Procedure: LEFT NEEDLE LOCALIZATION BREAST LUMPECTOMY TION AND AXILLARY SENTINEL LYMPH NODE BX;  Surgeon: Edward Jolly, MD;  Location: [facility];  Service: General;  Laterality: Left;  . BREAST LUMPECTOMY WITH RADIOACTIVE SEED AND SENTINEL LYMPH NODE BIOPSY Right 10/26/2017   Procedure: BREAST LUMPECTOMY WITH RADIOACTIVE SEED AND SENTINEL LYMPH NODE BIOPSY;  Surgeon: Excell Seltzer, MD;  Location: [place];  Service: General;  Laterality: Right;  . BREAST SURGERY  1990's   right- fibroid cyst  . CHOLECYSTECTOMY    . SEPTOPLASTY    . TONSILLECTOMY      I have reviewed the social history and family history with the patient and they are unchanged from previous note.  ALLERGIES:  is allergic to penicillins; prednisone; and radiaplexrx [woun'dres hydrogel wound dress].  MEDICATIONS:  Current  Outpatient Medications  Medication Sig Dispense Refill  . ACCU-CHEK AVIVA PLUS test strip USE TO TEST TWICE DAILY **ICD-10 CODE E13.9** 200 each 3  . allopurinol (ZYLOPRIM) 300 MG tablet TAKE 1 TABLET (300 MG TOTAL) BY MOUTH DAILY. **DNF 04/06/15** 90 tablet 3  . atenolol (TENORMIN) 25 MG tablet TAKE 1 TABLET BY MOUTH EVERY DAY 90 tablet 2  . chlorpheniramine (CHLOR-TRIMETON) 4 MG tablet Take 4 mg 2 (two) times daily as needed by mouth for allergies.    Marland Kitchen diltiazem (CARDIZEM CD) 360 MG 24 hr capsule TAKE 1 CAPSULE (360 MG TOTAL) BY MOUTH DAILY. 90 capsule 3  . ELIQUIS 5 MG TABS tablet TAKE 1 TABLET  BY MOUTH TWICE A DAY 180 tablet 1  . fish oil-omega-3 fatty acids 1000 MG capsule Take 1 g by mouth daily.    Marland Kitchen lidocaine-prilocaine (EMLA) cream Apply 1 application topically as needed. 30 g 3  . loperamide (IMODIUM) 2 MG capsule Take 2 mg by mouth as needed for diarrhea or loose stools.    . magic mouthwash w/lidocaine SOLN Take 5 mLs by mouth 3 (three) times daily. Swish and spit 240 mL 0  . metFORMIN (GLUCOPHAGE) 500 MG tablet Take 1 tablet (500 mg total) by mouth 2 (two) times daily. 180 tablet 3  . Multiple Vitamin (MULTIVITAMIN) tablet Take 1 tablet by mouth daily.    . ondansetron (ZOFRAN) 8 MG tablet Take 1 tablet (8 mg total) by mouth 2 (two) times daily as needed (Nausea or vomiting). 30 tablet 1  . OVER THE COUNTER MEDICATION Take by mouth 2 (two) times daily. Presavision- for macular degeneration prevention    . prochlorperazine (COMPAZINE) 10 MG tablet Take 1 tablet (10 mg total) by mouth every 6 (six) hours as needed (Nausea or vomiting). 30 tablet 1  . triamcinolone lotion (KENALOG) 0.1 % Apply 1 application topically 3 (three) times daily. 60 mL 1  . VITAMIN D, ERGOCALCIFEROL, PO Take by mouth.     No current facility-administered medications for this visit.    Facility-Administered Medications Ordered in Other Visits  Medication Dose Route Frequency Provider Last Rate Last Dose  .  heparin lock flush 100 unit/mL  500 Units Intracatheter Once PRN Truitt Merle, MD      . sodium chloride flush (NS) 0.9 % injection 10 mL  10 mL Intracatheter PRN Truitt Merle, MD        PHYSICAL EXAMINATION: ECOG PERFORMANCE STATUS: 1 - Symptomatic but completely ambulatory  Vitals:   01/28/18 1001  BP: 118/70  Pulse: 97  Resp: 18  Temp: 97.9 F (36.6 C)  SpO2: 97%   Filed Weights   01/28/18 1001  Weight: 135 lb 1.6 oz (61.3 kg)    GENERAL:alert, no distress and comfortable SKIN:  no rashes or significant lesions EYES:  sclera clear OROPHARYNX:no thrush or ulcers LYMPH:  no palpable cervical or supraclavicular lymphadenopathy  LUNGS: clear to auscultation with normal breathing effort HEART: regular rate & rhythm, no lower extremity edema ABDOMEN:abdomen soft, non-tender and normal bowel sounds Musculoskeletal:no cyanosis of digits and no clubbing  NEURO: alert & oriented x 3 with fluent speech, no focal motor/sensory deficits Breasts: bilateral lumpectomies with baseline nipple inversion, no discharge. Incisions are well healed. No palpable mass in either breast or axilla that I could appreciate.  PAC  Without erythema   LABORATORY DATA:  I have reviewed the data as listed CBC Latest Ref Rng & Units 01/28/2018 01/21/2018 01/07/2018  WBC 3.9 - 10.3 K/uL 6.2 6.4 5.9  Hemoglobin 11.6 - 15.9 g/dL 10.5(L) 11.3(L) 10.3(L)  Hematocrit 34.8 - 46.6 % 31.8(L) 34.9 31.0(L)  Platelets 145 - 400 K/uL 215 235 249     CMP Latest Ref Rng & Units 01/28/2018 01/21/2018 01/07/2018  Glucose 70 - 99 mg/dL 235(H) 125(H) 147(H)  BUN 8 - 23 mg/dL '23 20 22  '$ Creatinine 0.44 - 1.00 mg/dL 1.19(H) 1.11(H) 1.05(H)  Sodium 135 - 145 mmol/L 143 141 142  Potassium 3.5 - 5.1 mmol/L 3.7 4.3 3.4(L)  Chloride 98 - 111 mmol/L 106 105 109  CO2 22 - 32 mmol/L '25 25 23  '$ Calcium 8.9 - 10.3 mg/dL 9.4 9.7 8.8(L)  Total Protein 6.5 -  8.1 g/dL 6.7 6.9 6.8  Total Bilirubin 0.3 - 1.2 mg/dL 0.4 0.4 0.4  Alkaline Phos 38 -  126 U/L 111 146(H) 99  AST 15 - 41 U/L '21 20 27  '$ ALT 0 - 44 U/L 27 22 36      RADIOGRAPHIC STUDIES: I have personally reviewed the radiological images as listed and agreed with the findings in the report. No results found.   ASSESSMENT & PLAN: 83 y.o.[place], [place] woman   1. Breast cancer of upper outer quadrant of right breast, invasive ductal carcinoma, stage IB, pT1bN0M0, grade 2, Triple Negative, Ki67: 80% 2. pT1bN0, stage IA invasive ductal carcinoma of the left breast, grade I, ER 100%, PR 88%, Ki-67 16%, HER-2/neu negative (2014) 3. Osteopenia, on vitamin D;DEXA scan on 09/20/2017 showed: Osteopenia, very high risk of fracture, 29% for major osteoporotic fracture and 18% for hip fracture 4. Hypercalcemia 5. DM 6. Social support 7. Chemotherapy induced neutropenia, stable with dose reduction to abraxane 70 mg/m2  8. Skin rash,suspected secondary to abraxane 9. Mucositis, grade 2, secondary to chemotherapy, resolved  10. Epistaxis, on eliquis   Ms. Stave appears stable. She completed 7 weekly doses of abraxane at 70 mg/m2. She continues to tolerate well. Her fatigue continues to improve. She had 1 episode of epistaxis, etiology unclear. She will try humidifier at night. She held one dose of eliquis after the bleeding event. She knows she is at increased risk for bleeding. She will call if the episode recurs. Otherwise she is doing well.   Labs reviewed, CBC stable. CMP with elevated BG and Cr. We reviewed diet recommendations and increased hydration. I will hold on escalating metformin given one time BG >200. Cr slightly trending up to 1.19 today. Will give 500 cc NS today during infusion and monitor closely.   PLAN: -labs reviewed, proceed with abraxane today (8th dose) at current dose 70 mg/m2 -500 cc NS over 2 hours today for hyperglycemia and slightly elevated Cr -can use humidifier in room, monitor for recurrent bleeding -f/u with weekly chemo   All questions were  answered. The patient knows to call the clinic with any problems, questions or concerns. No barriers to learning was detected. I spent 20 minutes counseling the patient face to face. The total time spent in the appointment was 25 minutes and more than 50% was on counseling and review of test results     Alla Feeling, NP 01/28/18

## 2018-01-28 NOTE — Telephone Encounter (Signed)
Appts scheduled avs/calendar printed per 9/27 los

## 2018-02-01 NOTE — Progress Notes (Signed)
[facility] Follow-up office visit  Date of Service:  02/04/2018   ID: Adrienne [name] OB: 09-13-1934  MR#: [id number]  BMW#:[id number]  PCP: [name], NP      CHIEF COMPLAINT: Follow up for right breast cancer, triple negative   Oncology History   Cancer Staging Breast cancer of upper-outer quadrant of right female breast [facility]) Staging form: Breast, AJCC 8th Edition - Clinical stage from 09/22/2017: Stage IB (cT1b, cN0, cM0, G2, ER-, PR-, HER2-) - Signed by Truitt Merle, MD on 10/08/2017  Breast cancer, left breast [facility]) Staging form: Breast, AJCC 7th Edition - Clinical stage from 09/12/2012: Stage IA (T1b, N0, M0) - Unsigned        Breast cancer, left breast ([place])   08/25/2012 Receptors her2    ER 100% positive, PR 88% positive, HER-2 negative, Ki-67 16%    08/30/2012 Initial Diagnosis    Breast cancer, left breast    09/12/2012 Pathology Results    T1bN0 Grade 1 invasive ductal carcinoma, and DCIS.    09/12/2012 Surgery    Left breast lumpectomy and sentinel lymph node biopsy, negative margins.    11/09/2012 - 12/13/2012 Radiation Therapy    Adjuvant breast radiation    12/2012 - 11/2017 Anti-estrogen oral therapy    Anastrozole 1 mg daily, switched to Aromasin 25 mg daily in Jan 2015 due to diarrhea. Her Exemestane was switched to letrozole in 08/2017 due to high copay. She completed her 5 years in 11/2017.     09/01/2016 Imaging    [name] Outside films Breast form [facility] 09/01/2016 IMPRESSION: Probably Benign 1. No significant change in oval hypoechoic masses in the left breast at 11:00 2 cm from the nipple and 10:30 4 cm from the nipple. Both of these masses resemble the area of fat necrosis previously biopsies at 2:00. In addition, both masses have developing central benign or dystrophic appearing calcifications and are favored to be other areas of fat necrosis. A 6 month follow-up left breast mammogram and ultrasound is recommended.    09/01/2016 Mammogram    HM  Mammogram from [facility] 09/01/16 IMPRESSION  Incomplete - additional imaging evaluation needed Ultrasound of the upper medial left breast mass is recommended.     03/09/2017 Mammogram    Previous lumpectomy changes in the upper outer left breast anterior depth with stable associated dystrophic calcifications.  Biopsy clip at the 2:00 in the left breast near the lumpectomy site corresponding to previously biopsied benign fat necrosis.  Persistent round mass in the upper medial left breast with benign-appearing central round calcification.  No significant masses, calcifications, or other findings are seen in the breast  Impression: ultrasound is recommended for the left breast    03/09/2017 Breast US    Left breast ultrasound impression:  No significant change in oval hypoechoic masses in the left breast at 11:00 2 cm from the nipple and 10:30 4 cm from the nipple.  Both of these masses resemble the area of fat necrosis previously biopsied at 2:00.  In addition, both masses have developing central benign or dystrophic appearing calcifications and are favored to be other areas of fat necrosis.  A 31-monthfollow-up bilateral mammogram and left breast ultrasound is recommended.    09/14/2017 Breast [place]   Breast [place] 09/14[place]17 at [facility]  IMPRESSION:  The 1 cm oval mass in the left breast at 9:30 posterior depth is highly suggestive of malignancy. An Ultrasound guided biopsy is recommended.  The 0.8 cm round mass in  the left breast at 11-12 o'clock anterior depth is suspicious of malignancy. An ultrasound guided biopsy is recommended.     09/22/2017 Pathology Results    Diagnosis 1. Breast, right, needle core biopsy - INVASIVE DUCTAL CARCINOMA, MSBR GRADE II/III. - DUCTAL CARCINOMA IN SITU WITH NECROSIS. 2. Breast, left, needle core biopsy - FAT NECROSIS. - NO EVIDENCE OF MALIGNANCY.    10/17/2017 Genetic Testing    Negative genetic testing on the Multicancer panel.  The Multi-Gene Panel  offered by [facility] includes sequencing and/or deletion duplication testing of the following 83 genes: ALK, APC, ATM, AXIN2,BAP1,  BARD1, BLM, BMPR1A, BRCA1, BRCA2, BRIP1, CASR, CDC73, CDH1, CDK4, CDKN1B, CDKN1C, CDKN2A (p14ARF), CDKN2A (p16INK4a), CEBPA, CHEK2, CTNNA1, DICER1, DIS3L2, EGFR (c.2369C>T, p.Thr790Met variant only), EPCAM (Deletion/duplication testing only), FH, FLCN, GATA2, GPC3, GREM1 (Promoter region deletion/duplication testing only), HOXB13 (c.251G>A, p.Gly84Glu), HRAS, KIT, MAX, MEN1, MET, MITF (c.952G>A, p.Glu318Lys variant only), MLH1, MSH2, MSH3, MSH6, MUTYH, NBN, NF1, NF2, NTHL1, PALB2, PDGFRA, PHOX2B, PMS2, POLD1, POLE, POT1, PRKAR1A, PTCH1, PTEN, RAD50, RAD51C, RAD51D, RB1, RECQL4, RET, RUNX1, SDHAF2, SDHA (sequence changes only), SDHB, SDHC, SDHD, SMAD4, SMARCA4, SMARCB1, SMARCE1, STK11, SUFU, TERT, TERT, TMEM127, TP53, TSC1, TSC2, VHL, WRN and WT1.  The report date is October 17, 2017.     Breast cancer of upper-outer quadrant of right female breast (HCC)   09/14/2017 Mammogram    Diagnostic mammogram at [facility]  IMPRESSION:  The new 0.9 cm oval high density mass in the right breast posterior depth superior region seen on the mediolateral oblique view only is indeterminate. The 1.1 cm focal asymmetry in the left breast central to the nipple anterior depth is indeterminate.     09/14/2017 Imaging    US Bilateral at [facility] IMPRESSION: The 1 cm oval mass in the right breast at 9:30 posterior depth is highly suggestive of malignancy. The 0.8 cm round mass in the left breast at 11-12 o'clock anterior depth is suspicious of malignancy.     09/22/2017 Cancer Staging    Staging form: Breast, AJCC 8th Edition - Clinical stage from 09/22/2017: Stage IB (cT1b, cN0, cM0, G2, ER-, PR-, HER2-) - Signed by Mustapha Colson, MD on 10/08/2017    09/22/2017 Pathology Results    Bilateral needle core biopsy 1. Breast, right, needle core biopsy - INVASIVE DUCTAL CARCINOMA, MSBR GRADE II/III. - DUCTAL  CARCINOMA IN SITU WITH NECROSIS. 2. Breast, left, needle core biopsy - FAT NECROSIS. - NO EVIDENCE OF MALIGNANCY.    09/25/2017 Receptors her2    Estrogen Receptor: 0 Progesterone 0 HER2: Negative. Ki-67: 80%     10/08/2017 Initial Diagnosis    Breast cancer of upper-outer quadrant of right female breast (HCC)    10/26/2017 Surgery    BREAST LUMPECTOMY WITH RADIOACTIVE SEED AND SENTINEL LYMPH NODE BIOPSY by Dr. Hoxworth  10/26/17    10/26/2017 Pathology Results    Diagnosis 10/26/17 1. Breast, lumpectomy, Right - INVASIVE DUCTAL CARCINOMA, GRADE 2, SPANNING 1 CM. - HIGH GRADE DUCTAL CARCINOMA IN SITU WITH NECROSIS. - FINAL RESECTION MARGINS (PARTS #2, 3, 4) ARE NEGATIVE. - BIOPSY SITE. - SEE ONCOLOGY TABLE. 2. Breast, excision, additional anterior margin- 2 pieces right - BENIGN BREAST TISSUE. 3. Breast, excision, Right unoriented anterior margin - BENIGN BREAST TISSUE. 4. Breast, excision, Right deep margin - BENIGN BREAST TISSUE. 5. Lymph node, sentinel, biopsy, Right Axillary - ONE OF ONE LYMPH NODES NEGATIVE FOR CARCINOMA (0/1).    10/26/2017 Cancer Staging    Staging form: Breast, AJCC 8th Edition - Pathologic stage   from 10/26/2017: Stage IB (pT1b, pN0, cM0, G2, ER-, PR-, HER2-) - Signed by Truitt Merle, MD on 11/12/2017    12/03/2017 -  Chemotherapy    Weekly Abraxane starting 12/03/17      CURRENT THERAPY:   -Adjuvant weekly Abraxane for 12 weeks starting on 12/03/17   INTERVAL HISTORY:  Adrienne [name] returns for follow-up for right breast cancer and weekly Abraxane. She presents to the clinic today with her friend. She notes she has been doing well and able to do balance classes regularly. She notes she plans to do a drive to the [place] with her friend next week.  She has not been doing her cryotherapy regularly because she forgets her bags. She denies having any numbness and tingling in her hands or feet. She notes she has 3 black toenails after hitting her foot.       PAST MEDICAL HISTORY: Past Medical History:  Diagnosis Date  . Allergy   . Atrial fibrillation ([place])   . Breast cancer ([place]) 08/25/12   Invasive ductal ca,DCIS  . Diabetes mellitus    type II  . Family history of cancer   . Gout   . Hearing loss   . History of breast cancer   . History of frequent urinary tract infections   . Hx of radiation therapy 11/22/12- 12/19/12   breast 4250 cGy 17 sessions, left breast boost 750 cGy 3 sessions  . Hyperlipidemia   . Hypertension   . Osteopenia     PAST SURGICAL HISTORY: Past Surgical History:  Procedure Laterality Date  . ABDOMINAL HYSTERECTOMY Bilateral 1999   w/b/l salpingo-oopherectomy  . APPENDECTOMY    . BREAST LUMPECTOMY WITH NEEDLE LOCALIZATION AND AXILLARY SENTINEL LYMPH NODE BX Left 09/12/2012   Procedure: LEFT NEEDLE LOCALIZATION BREAST LUMPECTOMY TION AND AXILLARY SENTINEL LYMPH NODE BX;  Surgeon: Edward Jolly, MD;  Location: [facility];  Service: General;  Laterality: Left;  . BREAST LUMPECTOMY WITH RADIOACTIVE SEED AND SENTINEL LYMPH NODE BIOPSY Right 10/26/2017   Procedure: BREAST LUMPECTOMY WITH RADIOACTIVE SEED AND SENTINEL LYMPH NODE BIOPSY;  Surgeon: Excell Seltzer, MD;  Location: [facility];  Service: General;  Laterality: Right;  . BREAST SURGERY  1990's   right- fibroid cyst  . CHOLECYSTECTOMY    . SEPTOPLASTY    . TONSILLECTOMY      FAMILY HISTORY Family History  Problem Relation Age of Onset  . Hypertension Mother   . Heart disease Mother        Murmur and irregular heart disease  . Dementia Mother        d. 83  . Hypertension Father   . Aneurysm Father 50  . Breast cancer Cousin 34       mat first cousin    GYNECOLOGIC HISTORY: menarche at age 8, g33 p3, s/p TAH/BSO in early 69s.  Was on HRT for several years (cannot recall the duration).  No h/o abnormal pap smears or sexually transmitted infections.    SOCIAL HISTORY: Lives in [place], [place] in a three story house, will  move to [facility] soon.  Her husband lives at a nursing home due to dementia.  She is a retired Pharmacist, hospital.  Her son and daughter live in [place].      ADVANCED DIRECTIVES: Not in place.    HEALTH MAINTENANCE: Social History   Tobacco Use  . Smoking status: Former Smoker    Packs/day: 0.25    Years: 4.00    Pack years: 1.00  Types: Cigarettes  . Smokeless tobacco: Never Used  . Tobacco comment: Cigarette use was 50 years ago  Substance Use Topics  . Alcohol use: Yes    Comment: Occ glass of wine with dinner  . Drug use: No    Mammogram: 09/14/2017  Colonoscopy: 2011 Bone Density Scan: 03/05/2015 osteopenia  Pap Smear: s/p tah/bso Eye Exam: due Vitamin D Level:  Normal, 04/14/13 Lipid Panel: unsure   Allergies  Allergen Reactions  . Penicillins Hives and Swelling  . Prednisone Other (See Comments)    ELEVATED BLOOD SUGAR  . Radiaplexrx [Woun'Dres Hydrogel Wound Dress] Hives    Current Outpatient Medications  Medication Sig Dispense Refill  . ACCU-CHEK AVIVA PLUS test strip USE TO TEST TWICE DAILY **ICD-10 CODE E13.9** 200 each 3  . allopurinol (ZYLOPRIM) 300 MG tablet TAKE 1 TABLET (300 MG TOTAL) BY MOUTH DAILY. **DNF 04/06/15** 90 tablet 3  . atenolol (TENORMIN) 25 MG tablet TAKE 1 TABLET BY MOUTH EVERY DAY 90 tablet 2  . chlorpheniramine (CHLOR-TRIMETON) 4 MG tablet Take 4 mg 2 (two) times daily as needed by mouth for allergies.    Marland Kitchen diltiazem (CARDIZEM CD) 360 MG 24 hr capsule TAKE 1 CAPSULE (360 MG TOTAL) BY MOUTH DAILY. 90 capsule 3  . ELIQUIS 5 MG TABS tablet TAKE 1 TABLET BY MOUTH TWICE A DAY 180 tablet 1  . fish oil-omega-3 fatty acids 1000 MG capsule Take 1 g by mouth daily.    Marland Kitchen lidocaine-prilocaine (EMLA) cream Apply 1 application topically as needed. 30 g 3  . loperamide (IMODIUM) 2 MG capsule Take 2 mg by mouth as needed for diarrhea or loose stools.    . magic mouthwash w/lidocaine SOLN Take 5 mLs by mouth 3 (three) times daily. Swish and spit 240 mL  0  . metFORMIN (GLUCOPHAGE) 500 MG tablet Take 1 tablet (500 mg total) by mouth 2 (two) times daily. 180 tablet 3  . Multiple Vitamin (MULTIVITAMIN) tablet Take 1 tablet by mouth daily.    . ondansetron (ZOFRAN) 8 MG tablet Take 1 tablet (8 mg total) by mouth 2 (two) times daily as needed (Nausea or vomiting). 30 tablet 1  . OVER THE COUNTER MEDICATION Take by mouth 2 (two) times daily. Presavision- for macular degeneration prevention    . prochlorperazine (COMPAZINE) 10 MG tablet Take 1 tablet (10 mg total) by mouth every 6 (six) hours as needed (Nausea or vomiting). 30 tablet 1  . triamcinolone lotion (KENALOG) 0.1 % Apply 1 application topically 3 (three) times daily. 60 mL 1  . VITAMIN D, ERGOCALCIFEROL, PO Take by mouth.     No current facility-administered medications for this visit.    REVIEW OF SYSTEMS:   Constitutional: Denies fevers, chills or abnormal night sweats Eyes: Denies blurriness of vision, double vision or watery eyes Ears, nose, mouth, throat, and face: Denies mucositis or sore throat (+) occasional epistaxis   Respiratory: Denies cough, dyspnea or wheezes Cardiovascular: Denies palpitation, chest discomfort or lower extremity swelling Gastrointestinal:  Denies nausea, heartburn or change in bowel habits  Skin: Denies abnormal skin rashes (+) moderate diffuse chemo rash on b/l forearms, will itching (+) 3 Black toenails after hitting her foot  Lymphatics: Denies new lymphadenopathy or easy bruising Neurological:Denies numbness, tingling or new weaknesses Behavioral/Psych: Mood is stable, no new changes  All other systems were reviewed with the patient and are negative.  OBJECTIVE:   Vitals:   02/04/18 1126  BP: 121/77  Pulse: 71  Resp: 18  Temp: 97.6  F (36.4 C)  SpO2: 98%     Body mass index is 25.39 kg/m.     GENERAL: Patient is a well appearing female in no acute distress HEENT:  Sclerae anicteric.  Oropharynx clear and moist. No ulcerations or evidence of  oropharyngeal candidiasis. Neck is supple.  NODES:  No cervical, supraclavicular, or axillary lymphadenopathy palpated.  LUNGS:  Clear to auscultation bilaterally.  No wheezes or rhonchi. HEART:  Regular rate and rhythm. No murmur appreciated. ABDOMEN:  Soft, nontender.  Positive, normoactive bowel sounds. No organomegaly palpated. MSK:  No focal spinal tenderness to palpation. Full range of motion bilaterally in the upper extremities. EXTREMITIES:  No peripheral edema.   SKIN:  Clear with no obvious rashes or skin changes. No nail  dyscrasia. (+) bruises from blood withdrawal  (+) diffuse chemo rash on b/l forearms, with mild skin erythema  NEURO:  Nonfocal. Well oriented.  Appropriate affect. Breasts: Breast inspection showed them to be symmetrical with no nipple discharge. (+) S/p Right Breast Lumpectomy: Surgical incision in right breast and axilla healing very well, no discharge. Palpation of the breasts and axilla revealed no obvious mass that I could appreciate.    ECOG FS:1 - Symptomatic but completely ambulatory  LAB RESULTS: CBC Latest Ref Rng & Units 02/04/2018 01/28/2018 01/21/2018  WBC 3.9 - 10.3 K/uL 4.6 6.2 6.4  Hemoglobin 11.6 - 15.9 g/dL 10.2(L) 10.5(L) 11.3(L)  Hematocrit 34.8 - 46.6 % 31.1(L) 31.8(L) 34.9  Platelets 145 - 400 K/uL 207 215 235    CMP Latest Ref Rng & Units 02/04/2018 01/28/2018 01/21/2018  Glucose 70 - 99 mg/dL 137(H) 235(H) 125(H)  BUN 8 - 23 mg/dL '16 23 20  '$ Creatinine 0.44 - 1.00 mg/dL 0.99 1.19(H) 1.11(H)  Sodium 135 - 145 mmol/L 141 143 141  Potassium 3.5 - 5.1 mmol/L 3.8 3.7 4.3  Chloride 98 - 111 mmol/L 107 106 105  CO2 22 - 32 mmol/L '24 25 25  '$ Calcium 8.9 - 10.3 mg/dL 9.4 9.4 9.7  Total Protein 6.5 - 8.1 g/dL 6.5 6.7 6.9  Total Bilirubin 0.3 - 1.2 mg/dL 0.5 0.4 0.4  Alkaline Phos 38 - 126 U/L 107 111 146(H)  AST 15 - 41 U/L '21 21 20  '$ ALT 0 - 44 U/L '25 27 22    '$ PATHOLOGY REPORT  Diagnosis 10/26/17 1. Breast, lumpectomy, Right - INVASIVE  DUCTAL CARCINOMA, GRADE 2, SPANNING 1 CM. - HIGH GRADE DUCTAL CARCINOMA IN SITU WITH NECROSIS. - FINAL RESECTION MARGINS (PARTS #2, 3, 4) ARE NEGATIVE. - BIOPSY SITE. - SEE ONCOLOGY TABLE. 2. Breast, excision, additional anterior margin- 2 pieces right - BENIGN BREAST TISSUE. 3. Breast, excision, Right unoriented anterior margin - BENIGN BREAST TISSUE. 4. Breast, excision, Right deep margin - BENIGN BREAST TISSUE. 5. Lymph node, sentinel, biopsy, Right Axillary - ONE OF ONE LYMPH NODES NEGATIVE FOR CARCINOMA (0/1). Microscopic Comment 1. INVASIVE CARCINOMA OF THE BREAST: Resection Procedure: Right breast lumpectomy with additional anterior and posterior margin. Right axillary lymph node biopsy. Specimen Laterality: Right. Tumor Size: 1 cm. Histologic Type: Invasive ductal carcinoma. Histologic Grade: Glandular (Acinar)/Tubular Differentiation: 2 1 of 3 Amended copy Corrected FINAL for HONG, TIMM ([id number]) Microscopic Comment(continued) Nuclear Pleomorphism: 2 Mitotic Rate: 2 Overall Grade: 2 Ductal Carcinoma In Situ: Present, high grade with necrosis. Tumor Extension: Confined to breast parenchyma. Margins: Distance from closest margin (millimeters): Originally <1 mm anterior, 1 mm posterior. Final anterior and posterior margins (parts 2, 3, 4) are negative. Specify closest margin (required only if <  40m): Final margins >10 mm. DCIS Margins Distance from closest margin (millimeters): Original 2-3 mm anterior, 1 mm posterior. Final anterior and posterior margins (parts 2, 3, 4) are negative. Specify closest margin (required only if <190m: Final margins >10 mm Regional Lymph Nodes: Number of Lymph Nodes Examined: 1 Number of Sentinel Nodes Examined (if applicable): 1 Number of Lymph Nodes with Macrometastases (>2 mm): 0 Number of Lymph Nodes with Micrometastases: 0 Number of Lymph Nodes with Isolated Tumor Cells (0.2 mm or 200 cells)#: 0 Size of Largest  Metastatic Deposit (millimeters): N/A. Extranodal Extension N/A. Treatment Effect: No known presurgical therapy Breast Biomarker Testing Performed on Previous Biopsy: Testing Performed on Case Number: [id number] Receptor: 0 Progesterone 0 HER2: Negative. Ki-67: 80% Representative tumor block: 1A-C. Pathologic Stage Classification (pTNM, AJCC 8th Edition): pT1b, pN0 (v4.2.0.0)   Diagnosis, 09/22/2017 1. Breast, right, needle core biopsy - INVASIVE DUCTAL CARCINOMA, MSBR GRADE II/III. - DUCTAL CARCINOMA IN SITU WITH NECROSIS. 2. Breast, left, needle core biopsy - FAT NECROSIS. - NO EVIDENCE OF MALIGNANCY.   Diagnosis  08/28/2015 Breast, left, needle core biopsy - FAT NECROSIS. - THERE IS NO EVIDENCE OF MALIGNANCY. - SEE COMMENT.         STUDIES: I have [facility]  Osteopenia, very high risk of fracture, 29% for major osteoporotic fracture and 18% for hip fracture.   Breast Diagnostic 09/14/17 at [facility]  IMPRESSION:  The new 0.9 cm oval high density mass in the right breast posterior depth superior region seen on the mediolateral oblique view only is indeterminate. The 1.1 cm focal asymmetry in the left breast central to the nipple anterior depth is indeterminate.   [facility], 09/14/2017 at [facility] IMPRESSION: The 1 cm oval mass in the right breast at 9:30 posterior depth is highly suggestive of malignancy. The 0.8 cm round mass in the left breast at 11-12 o'clock anterior depth is suspicious of malignancy.    [facility] Breast 03/09/17  IMPRESSION: 1. No significant change in oval masses in the left breast at the 11:00 cm from the nipple and 10:30 4 cm from the nipple.  Both of these masses resemble the area of fat necrosis previously biopsied at 2:00. In addition, both of these masses have developing central benign or dystrophic appearing calcifications and are favored to be other areas of fat  necrosis.  A 6 month follow up bilateral mammogram and left breast [facility] recommended.  [facility] Breast form [facility] 09/01/2016 IMPRESSION: Probably Benign 1. No significant change in oval hypoechoic masses in the left breast at 11:00 2 cm from the nipple and 10:30 4 cm from the nipple. Both of these masses resemble the area of fat necrosis previously biopsies at 2:00. In addition, both masses have developing central benign or dystrophic appearing calcifications and are favored to be other areas of fat necrosis. A 6 month follow-up left breast mammogram and ultrasound is recommended.  [facility] IMPRESSION  Incomplete - additional imaging evaluation needed Ultrasound of the upper medial left breast mass is recommended.   Left breast mammogram and ultrasound 02/27/2016 Impression: probably benign The 4 mm oval mass in the left breast 12:00 position likely represent fat necrosis, the 3 mm mass in the left breast upper inner quadrant middle depth most likely is fat necrosis, the 4 mm oval cyst in the left breast upper inner quadrant posterior depth is benign. Post surgical scar in the left  breast upper outer quadrant anterior depth is benign.  ASSESSMENT:  83 y.o. [place], [place] woman   1. Breast cancer of upper outer quadrant of right breast, invasive ductal carcinoma, stage IB, pT1bN0M0, grade 2, Triple Negative, Ki67: 80% -We previously discussed that her newly diagnosed right breast is different from her previous left breast cancer. It is triple negative and more aggressive. We previously discussed moderate to high risk of recurrence, and the role of adjuvant chemo. Due to her advanced age, she is not a candidate for intensive chemotherapy. However she does have overall good health and performance status, she likely would be able to tolerate single agent Taxol or Abraxane if needed. -Her genetic testing was negative -She previously underwent right breast lumpectomy with  SLNB by Dr. Excell Seltzer on 10/26/17. I discussed her pathology which shows a 1cm tumor, G2, triple negative tumor was completely resection with negative margin, node negative. I discussed this is more aggressive than her previous cancer.  -She has started weekly Abraxane on 12/03/17. She has tolerated moderately well with fatigue, mild skin rash, anemia and neutropenia. I will keep her Abraxane at '70mg'$ /m2 due to neutropenia. She also has intermittent diarrhea and nosebleeds lately along with fatigue. I encouraged her to take prophylactic imodium daily.  -Labs reviewed, Hg stable at 10.2, BG at 137, overall adequate to proceed with Abraxane today. She has 3 more infusions left.  -She plans to travel to the [place] the week of 10/14. As long as she has the energy and she stops every 2 hours during the drive she be fine to travel. If after her infusion and traveling she is not up to it I encouraged her to contact clinic and cancel her infusion for next week.  -After chemo she will discuss adjuvant radiation with Dr. Isidore Moos.  -F/u in 1 week   2. pT1bN0, stage IA invasive ductal carcinoma of the left breast, grade I, ER 100%, PR 88%, Ki-67 16%, HER-2/neu negative. -She previously underwent lumpectomy with sentinel lymph node biopsy on 09/12/2012, s/p radiation therapy from 11/09/2012 through 12/19/2012.  -Her left breast biopsy in 08/2015 showed fat necrosis. No clinical evidence of recurrence. -Her repeated mammogram in May 2018 showed 2 lesions in UIQ of left breast, stable, likely benign (fat necrosis per biopsy in 2017) -Left breast mammogram and ultrasound on 03/09/2017 reveal hypoechoic masses that have developed central benign or dystrophic appearing calcifications and are favored to be areas of fat necrosis. -She was on adjuvant Exemestane, tolerated well -09/16/17 US showed mass behind her left breast scar and a mass In her right breast, both highly suggestive of malignancy.  -Her Exemestane was  switched to letrozole in 08/2017 due to high copay. She tolerated well and completed in 11/2017.   3. Osteopenia -She is on vitamin D supplement daily. -Given her worsening hypercalcemia, I told her to stop calcium  -Her bone density scan from 03/05/2015 showed osteopenia, with 10 year probability of major ostial parotic fracture 25%, and hip fracture 15%.  -Given the high risk of fracture, she would benefit from bisphosphonate or Prolia injection. I previously discussed the benefit and potential side effects, and written material of Prolia was given. She has not done Prolia injection yet and she is hesitant to due to the concern of side effects. I will discuss the options of adjuvant Zometa after her chemotherapy.  -Patient most recent DEXA scan on 09/20/2017 showed: Osteopenia, very high risk of fracture, 29% for major osteoporotic fracture and 18% for hip fracture.  4. Hypercalcemia  -She knows to drink adequately and avoid dehydration  -She was previously instructed to restart Vit D in 03/2017 as well as continue light weight bearing exercise.  -She'll follow up with primary care physician for further workup.  -Ca normal at 9.4 today (02/04/18)  5. Diabetes and hyperglycemia  -She noticed hyperglycemia since she started steroids for her left hip pain. I previously suggested she stop taking her steroid medication as she no longer needs it.  -I previously encouraged her to f/u with PCP to see if she needs to change her metformin medication or dosage as her sugar has been higher lately.  -BG at 137 today, stable (02/04/18)  6. Social Support -She lives at [facility] and is very independent -Her children live close by her   7. Chemotherapy induced anemia   -Hb 9.5-10.5 lately, no need for blood treansfusion -continue monitoring     Plan -Lab reviewed and adequate to proceed with Abraxane today and continue weekly for 3 more weeks  -f/u in 1 week   I spent 15  minutes counseling the patient face to face.  The total time spent in the appointment was 20 minutes.  Oneal Deputy, am acting as scribe for Truitt Merle, MD.   I have reviewed the above documentation for accuracy and completeness, and I agree with the above.     Truitt Merle   02/04/2018

## 2018-02-04 ENCOUNTER — Inpatient Hospital Stay: Payer: Medicare Other

## 2018-02-04 ENCOUNTER — Inpatient Hospital Stay: Payer: Medicare Other | Admitting: Hematology

## 2018-02-04 ENCOUNTER — Inpatient Hospital Stay: Payer: Medicare Other | Attending: Hematology

## 2018-02-04 VITALS — BP 121/77 | HR 71 | Temp 97.6°F | Resp 18 | Ht 61.0 in | Wt 134.4 lb

## 2018-02-04 DIAGNOSIS — E876 Hypokalemia: Secondary | ICD-10-CM | POA: Insufficient documentation

## 2018-02-04 DIAGNOSIS — Z7901 Long term (current) use of anticoagulants: Secondary | ICD-10-CM | POA: Insufficient documentation

## 2018-02-04 DIAGNOSIS — M8580 Other specified disorders of bone density and structure, unspecified site: Secondary | ICD-10-CM | POA: Diagnosis not present

## 2018-02-04 DIAGNOSIS — D6481 Anemia due to antineoplastic chemotherapy: Secondary | ICD-10-CM

## 2018-02-04 DIAGNOSIS — E1165 Type 2 diabetes mellitus with hyperglycemia: Secondary | ICD-10-CM | POA: Insufficient documentation

## 2018-02-04 DIAGNOSIS — C50411 Malignant neoplasm of upper-outer quadrant of right female breast: Secondary | ICD-10-CM | POA: Insufficient documentation

## 2018-02-04 DIAGNOSIS — Z171 Estrogen receptor negative status [ER-]: Principal | ICD-10-CM

## 2018-02-04 DIAGNOSIS — E1142 Type 2 diabetes mellitus with diabetic polyneuropathy: Secondary | ICD-10-CM | POA: Diagnosis not present

## 2018-02-04 DIAGNOSIS — Z87891 Personal history of nicotine dependence: Secondary | ICD-10-CM | POA: Insufficient documentation

## 2018-02-04 DIAGNOSIS — Z853 Personal history of malignant neoplasm of breast: Secondary | ICD-10-CM | POA: Insufficient documentation

## 2018-02-04 DIAGNOSIS — Z170 Estrogen receptor positive status [ER+]: Secondary | ICD-10-CM

## 2018-02-04 DIAGNOSIS — C50412 Malignant neoplasm of upper-outer quadrant of left female breast: Secondary | ICD-10-CM

## 2018-02-04 DIAGNOSIS — Z7984 Long term (current) use of oral hypoglycemic drugs: Secondary | ICD-10-CM | POA: Insufficient documentation

## 2018-02-04 DIAGNOSIS — Z95828 Presence of other vascular implants and grafts: Secondary | ICD-10-CM

## 2018-02-04 DIAGNOSIS — Z79899 Other long term (current) drug therapy: Secondary | ICD-10-CM | POA: Diagnosis not present

## 2018-02-04 DIAGNOSIS — Z5111 Encounter for antineoplastic chemotherapy: Secondary | ICD-10-CM | POA: Insufficient documentation

## 2018-02-04 LAB — CMP (CANCER CENTER ONLY)
ALBUMIN: 3.4 g/dL — AB (ref 3.5–5.0)
ALK PHOS: 107 U/L (ref 38–126)
ALT: 25 U/L (ref 0–44)
AST: 21 U/L (ref 15–41)
Anion gap: 10 (ref 5–15)
BUN: 16 mg/dL (ref 8–23)
CALCIUM: 9.4 mg/dL (ref 8.9–10.3)
CO2: 24 mmol/L (ref 22–32)
CREATININE: 0.99 mg/dL (ref 0.44–1.00)
Chloride: 107 mmol/L (ref 98–111)
GFR, Estimated: 52 mL/min — ABNORMAL LOW (ref >60)
Glucose, Bld: 137 mg/dL — ABNORMAL HIGH (ref 70–99)
Potassium: 3.8 mmol/L (ref 3.5–5.1)
Sodium: 141 mmol/L (ref 135–145)
Total Bilirubin: 0.5 mg/dL (ref 0.3–1.2)
Total Protein: 6.5 g/dL (ref 6.5–8.1)

## 2018-02-04 LAB — CBC WITH DIFFERENTIAL (CANCER CENTER ONLY)
Basophils Absolute: 0.1 10*3/uL (ref 0.0–0.1)
Basophils Relative: 2 %
EOS ABS: 0.1 10*3/uL (ref 0.0–0.5)
Eosinophils Relative: 2 %
HCT: 31.1 % — ABNORMAL LOW (ref 34.8–46.6)
Hemoglobin: 10.2 g/dL — ABNORMAL LOW (ref 11.6–15.9)
LYMPHS ABS: 1.1 10*3/uL (ref 0.9–3.3)
Lymphocytes Relative: 23 %
MCH: 32.6 pg (ref 25.1–34.0)
MCHC: 32.8 g/dL (ref 31.5–36.0)
MCV: 99.4 fL (ref 79.5–101.0)
MONO ABS: 0.3 10*3/uL (ref 0.1–0.9)
Monocytes Relative: 7 %
Neutro Abs: 3 10*3/uL (ref 1.5–6.5)
Neutrophils Relative %: 66 %
PLATELETS: 207 10*3/uL (ref 145–400)
RBC: 3.13 MIL/uL — ABNORMAL LOW (ref 3.70–5.45)
RDW: 18.1 % — AB (ref 11.2–14.5)
WBC Count: 4.6 10*3/uL (ref 3.9–10.3)

## 2018-02-04 MED ORDER — PROCHLORPERAZINE MALEATE 10 MG PO TABS
10.0000 mg | ORAL_TABLET | Freq: Once | ORAL | Status: AC
  Administered 2018-02-04: 10 mg via ORAL

## 2018-02-04 MED ORDER — SODIUM CHLORIDE 0.9% FLUSH
10.0000 mL | Freq: Once | INTRAVENOUS | Status: AC
  Administered 2018-02-04: 10 mL
  Filled 2018-02-04: qty 10

## 2018-02-04 MED ORDER — SODIUM CHLORIDE 0.9% FLUSH
10.0000 mL | INTRAVENOUS | Status: DC | PRN
  Administered 2018-02-04: 10 mL
  Filled 2018-02-04: qty 10

## 2018-02-04 MED ORDER — PACLITAXEL PROTEIN-BOUND CHEMO INJECTION 100 MG
70.0000 mg/m2 | Freq: Once | INTRAVENOUS | Status: AC
  Administered 2018-02-04: 125 mg via INTRAVENOUS
  Filled 2018-02-04: qty 25

## 2018-02-04 MED ORDER — HEPARIN SOD (PORK) LOCK FLUSH 100 UNIT/ML IV SOLN
500.0000 [IU] | Freq: Once | INTRAVENOUS | Status: AC | PRN
  Administered 2018-02-04: 500 [IU]
  Filled 2018-02-04: qty 5

## 2018-02-04 MED ORDER — PROCHLORPERAZINE MALEATE 10 MG PO TABS
ORAL_TABLET | ORAL | Status: AC
  Filled 2018-02-04: qty 1

## 2018-02-04 MED ORDER — SODIUM CHLORIDE 0.9 % IV SOLN
Freq: Once | INTRAVENOUS | Status: AC
  Administered 2018-02-04: 13:00:00 via INTRAVENOUS
  Filled 2018-02-04: qty 250

## 2018-02-04 NOTE — Patient Instructions (Signed)
[facility] Discharge Instructions for Patients Receiving Chemotherapy  Today you received the following chemotherapy agents:  Abraxane.  To help prevent nausea and vomiting after your treatment, we encourage you to take your nausea medication as directed.   If you develop nausea and vomiting that is not controlled by your nausea medication, call the clinic.   BELOW ARE SYMPTOMS THAT SHOULD BE REPORTED IMMEDIATELY:  *FEVER GREATER THAN 100.5 F  *CHILLS WITH OR WITHOUT FEVER  NAUSEA AND VOMITING THAT IS NOT CONTROLLED WITH YOUR NAUSEA MEDICATION  *UNUSUAL SHORTNESS OF BREATH  *UNUSUAL BRUISING OR BLEEDING  TENDERNESS IN MOUTH AND THROAT WITH OR WITHOUT PRESENCE OF ULCERS  *URINARY PROBLEMS  *BOWEL PROBLEMS  UNUSUAL RASH Items with * indicate a potential emergency and should be followed up as soon as possible.  Feel free to call the clinic should you have any questions or concerns. The clinic phone number is [phone].  Please show the CHEMO ALERT CARD at check-in to the Emergency Department and triage nurse.   

## 2018-02-05 ENCOUNTER — Other Ambulatory Visit: Payer: Self-pay

## 2018-02-05 ENCOUNTER — Encounter (HOSPITAL_COMMUNITY): Payer: Self-pay

## 2018-02-05 ENCOUNTER — Encounter: Payer: Self-pay | Admitting: Hematology

## 2018-02-05 ENCOUNTER — Emergency Department (HOSPITAL_COMMUNITY)
Admission: EM | Admit: 2018-02-05 | Discharge: 2018-02-05 | Disposition: A | Discharge status: Home / Self Care | Payer: Medicare Other | Attending: Emergency Medicine | Admitting: Emergency Medicine

## 2018-02-05 DIAGNOSIS — Z79899 Other long term (current) drug therapy: Secondary | ICD-10-CM | POA: Diagnosis not present

## 2018-02-05 DIAGNOSIS — R040 Epistaxis: Secondary | ICD-10-CM

## 2018-02-05 DIAGNOSIS — Z7901 Long term (current) use of anticoagulants: Secondary | ICD-10-CM | POA: Insufficient documentation

## 2018-02-05 DIAGNOSIS — I10 Essential (primary) hypertension: Secondary | ICD-10-CM | POA: Insufficient documentation

## 2018-02-05 DIAGNOSIS — Z87891 Personal history of nicotine dependence: Secondary | ICD-10-CM | POA: Insufficient documentation

## 2018-02-05 DIAGNOSIS — E119 Type 2 diabetes mellitus without complications: Secondary | ICD-10-CM | POA: Insufficient documentation

## 2018-02-05 DIAGNOSIS — Z7984 Long term (current) use of oral hypoglycemic drugs: Secondary | ICD-10-CM | POA: Diagnosis not present

## 2018-02-05 LAB — CBC WITH DIFFERENTIAL/PLATELET
BASOS ABS: 0.1 10*3/uL (ref 0.0–0.1)
BASOS PCT: 2 %
EOS ABS: 0.1 10*3/uL (ref 0.0–0.7)
Eosinophils Relative: 2 %
HEMATOCRIT: 29 % — AB (ref 36.0–46.0)
Hemoglobin: 9.6 g/dL — ABNORMAL LOW (ref 12.0–15.0)
Lymphocytes Relative: 29 %
Lymphs Abs: 1 10*3/uL (ref 0.7–4.0)
MCH: 32.4 pg (ref 26.0–34.0)
MCHC: 33.1 g/dL (ref 30.0–36.0)
MCV: 98 fL (ref 78.0–100.0)
Monocytes Absolute: 0.3 10*3/uL (ref 0.1–1.0)
Monocytes Relative: 7 %
NEUTROS ABS: 2.2 10*3/uL (ref 1.7–7.7)
Neutrophils Relative %: 60 %
Platelets: 217 10*3/uL (ref 150–400)
RBC: 2.96 MIL/uL — ABNORMAL LOW (ref 3.87–5.11)
RDW: 17 % — ABNORMAL HIGH (ref 11.5–15.5)
WBC: 3.6 10*3/uL — AB (ref 4.0–10.5)

## 2018-02-05 LAB — BASIC METABOLIC PANEL
Anion gap: 9 (ref 5–15)
BUN: 26 mg/dL — AB (ref 8–23)
CHLORIDE: 109 mmol/L (ref 98–111)
CO2: 25 mmol/L (ref 22–32)
Calcium: 9.5 mg/dL (ref 8.9–10.3)
Creatinine, Ser: 1.01 mg/dL — ABNORMAL HIGH (ref 0.44–1.00)
GFR calc non Af Amer: 51 mL/min — ABNORMAL LOW (ref >60)
GFR, EST AFRICAN AMERICAN: 59 mL/min — AB (ref >60)
Glucose, Bld: 140 mg/dL — ABNORMAL HIGH (ref 70–99)
POTASSIUM: 3.6 mmol/L (ref 3.5–5.1)
Sodium: 143 mmol/L (ref 135–145)

## 2018-02-05 MED ORDER — LIDOCAINE HCL 2 % IJ SOLN
INTRAMUSCULAR | Status: AC
  Filled 2018-02-05: qty 20

## 2018-02-05 MED ORDER — SODIUM CHLORIDE 0.9 % IV SOLN
Freq: Once | INTRAVENOUS | Status: AC
  Administered 2018-02-05: 09:00:00 via INTRAVENOUS

## 2018-02-05 MED ORDER — CLINDAMYCIN HCL 150 MG PO CAPS: 150.0000 mg | ORAL_CAPSULE | Freq: Four times a day (QID) | ORAL | 0 refills | Status: DC

## 2018-02-05 MED ORDER — OXYMETAZOLINE HCL 0.05 % NA SOLN
1.0000 | Freq: Once | NASAL | Status: AC
  Administered 2018-02-05: 1 via NASAL
  Filled 2018-02-05: qty 15

## 2018-02-05 MED ORDER — SILVER NITRATE-POT NITRATE 75-25 % EX MISC
1.0000 "application " | Freq: Once | CUTANEOUS | Status: AC
  Administered 2018-02-05: 1 via TOPICAL
  Filled 2018-02-05: qty 1

## 2018-02-05 MED ORDER — HEPARIN SOD (PORK) LOCK FLUSH 100 UNIT/ML IV SOLN
500.0000 [IU] | Freq: Once | INTRAVENOUS | Status: AC | PRN
  Administered 2018-02-05: 500 [IU]
  Filled 2018-02-05: qty 5

## 2018-02-05 MED ORDER — HEPARIN SOD (PORK) LOCK FLUSH 100 UNIT/ML IV SOLN: 500.0000 [IU] | Freq: Once | INTRAVENOUS | Status: DC

## 2018-02-05 NOTE — Discharge Instructions (Addendum)
Please call ENT for recheck and packing removal on Monday. Return if you have return of bleeding Take antibiotic as prescribed until packing removed

## 2018-02-05 NOTE — ED Provider Notes (Signed)
[facility] DEPT Provider Note   CSN: [id number] Arrival date & time: 02/05/18  3500     History   Chief Complaint Chief Complaint  Patient presents with  . Epistaxis    HPI Adrienne [name] is a 83 y.o. female.  HPI  83 year old female history of A. fib, breast cancer, on Eliquis who presents today with nosebleeding.  She noted nosebleeding yesterday evening from the right nares.  It resolved but began again in the early morning hours.  She reports blood clots.  It resolved spontaneously on the way here.  She reports she had a couple episodes of these over the past several years but they have resolved on its own she has not needed to follow with ENT or have any interventions done.  Last chemotherapy was done yesterday.  She reports that her blood counts have been okay and she has not had other bleeding issues.  She has A. fib, but has not noted any lightheadedness or other weakness.  His blood in her stool, urine or coughing.  She has noted it mainly from the right nares with some dripping in the back of her throat area and she has not had nausea or vomiting.  Past Medical History:  Diagnosis Date  . Allergy   . Atrial fibrillation ([place])   . Breast cancer ([place]) 08/25/12   Invasive ductal ca,DCIS  . Diabetes mellitus    type II  . Family history of cancer   . Gout   . Hearing loss   . History of breast cancer   . History of frequent urinary tract infections   . Hx of radiation therapy 11/22/12- 12/19/12   breast 4250 cGy 17 sessions, left breast boost 750 cGy 3 sessions  . Hyperlipidemia   . Hypertension   . Osteopenia     Patient Active Problem List   Diagnosis Date Noted  . Port-A-Cath in place 01/28/2018  . Genetic testing 10/18/2017  . Family history of cancer   . History of breast cancer   . Breast cancer of upper-outer quadrant of right female breast ([place]) 10/08/2017  . Osteopenia 09/21/2015  . Rapid heart rate 04/01/2015  . Atrial  fibrillation ([place]) 04/01/2015  . 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 Breast cancer, left breast ([place]) 08/30/2012  . Influenza 08/04/2011  . Gout 06/01/2011  . Vaginitis, atrophic 06/01/2011  . RENAL INSUFFICIENCY, ACUTE 05/28/2010  . HEMATURIA, HX OF 05/28/2010  . Disorder of bone and cartilage 12/08/2007  . Hyperlipidemia 08/10/2007  . Essential hypertension 08/10/2007    Past Surgical History:  Procedure Laterality Date  . ABDOMINAL HYSTERECTOMY Bilateral 1999   w/b/l salpingo-oopherectomy  . APPENDECTOMY    . BREAST LUMPECTOMY WITH NEEDLE LOCALIZATION AND AXILLARY SENTINEL LYMPH NODE BX Left 09/12/2012   Procedure: LEFT NEEDLE LOCALIZATION BREAST LUMPECTOMY TION AND AXILLARY SENTINEL LYMPH NODE BX;  Surgeon: Edward Jolly, MD;  Location: [facility];  Service: General;  Laterality: Left;  . BREAST LUMPECTOMY WITH RADIOACTIVE SEED AND SENTINEL LYMPH NODE BIOPSY Right 10/26/2017   Procedure: BREAST LUMPECTOMY WITH RADIOACTIVE SEED AND SENTINEL LYMPH NODE BIOPSY;  Surgeon: Excell Seltzer, MD;  Location: [facility];  Service: General;  Laterality: Right;  . BREAST SURGERY  1990's   right- fibroid cyst  . CHOLECYSTECTOMY    . SEPTOPLASTY    . TONSILLECTOMY       OB History   None      Home Medications  Prior to Admission medications   Medication Sig Start Date End Date Taking? Authorizing Provider  ACCU-CHEK AVIVA PLUS test strip USE TO TEST TWICE DAILY **ICD-10 CODE E13.9** 10/12/17   Nafziger, Tommi Rumps, NP  allopurinol (ZYLOPRIM) 300 MG tablet TAKE 1 TABLET (300 MG TOTAL) BY MOUTH DAILY. **DNF 04/06/15** 04/02/17   Nafziger, Tommi Rumps, NP  atenolol (TENORMIN) 25 MG tablet TAKE 1 TABLET BY MOUTH EVERY DAY 01/24/18   Lelon Perla, MD  chlorpheniramine (CHLOR-TRIMETON) 4 MG tablet Take 4 mg 2 (two) times daily as needed by mouth for allergies.    [provider]  diltiazem (CARDIZEM CD) 360 MG 24 hr capsule TAKE 1 CAPSULE (360 MG TOTAL) BY  MOUTH DAILY. 01/05/18   Lelon Perla, MD  ELIQUIS 5 MG TABS tablet TAKE 1 TABLET BY MOUTH TWICE A DAY 01/04/18   Lelon Perla, MD  fish oil-omega-3 fatty acids 1000 MG capsule Take 1 g by mouth daily.    [provider]  lidocaine-prilocaine (EMLA) cream Apply 1 application topically as needed. 01/21/18   Alla Feeling, NP  loperamide (IMODIUM) 2 MG capsule Take 2 mg by mouth as needed for diarrhea or loose stools.    [provider]  magic mouthwash w/lidocaine SOLN Take 5 mLs by mouth 3 (three) times daily. Swish and spit 12/31/17   Truitt Merle, MD  metFORMIN (GLUCOPHAGE) 500 MG tablet Take 1 tablet (500 mg total) by mouth 2 (two) times daily. 12/30/16   Nafziger, Tommi Rumps, NP  Multiple Vitamin (MULTIVITAMIN) tablet Take 1 tablet by mouth daily.    [provider]  ondansetron (ZOFRAN) 8 MG tablet Take 1 tablet (8 mg total) by mouth 2 (two) times daily as needed (Nausea or vomiting). 11/12/17   Truitt Merle, MD  OVER THE COUNTER MEDICATION Take by mouth 2 (two) times daily. Presavision- for macular degeneration prevention    [provider]  prochlorperazine (COMPAZINE) 10 MG tablet Take 1 tablet (10 mg total) by mouth every 6 (six) hours as needed (Nausea or vomiting). 11/12/17   Truitt Merle, MD  triamcinolone lotion (KENALOG) 0.1 % Apply 1 application topically 3 (three) times daily. 12/21/17   Sandi Mealy E., PA-C  VITAMIN D, ERGOCALCIFEROL, PO Take by mouth.    [provider]    Family History Family History  Problem Relation Age of Onset  . Hypertension Mother   . Heart disease Mother        Murmur and irregular heart disease  . Dementia Mother        d. 4  . Hypertension Father   . Aneurysm Father 27  . Breast cancer Cousin 41       mat first cousin    Social History Social History   Tobacco Use  . Smoking status: Former Smoker    Packs/day: 0.25    Years: 4.00    Pack years: 1.00    Types: Cigarettes  . Smokeless tobacco: Never Used   . Tobacco comment: Cigarette use was 50 years ago  Substance Use Topics  . Alcohol use: Yes    Comment: Occ glass of wine with dinner  . Drug use: No     Allergies   Penicillins; Prednisone; and Radiaplexrx [woun'dres hydrogel wound dress]   Review of Systems Review of Systems  All other systems reviewed and are negative.    Physical Exam Updated Vital Signs BP (!) 152/107   Pulse (!) 102   Temp (!) 97.5 F (36.4 C) (Oral)  Resp 16   Ht 1.549 m (5\' 1" )   Wt 60 kg   SpO2 96%   BMI 24.99 kg/m   Physical Exam  Constitutional: She is oriented to person, place, and time. She appears well-developed and well-nourished. No distress.  HENT:  Head: Normocephalic and atraumatic.  Right Ear: External ear normal.  Left Ear: External ear normal.  Some blood in posterior oropharynx Right nares with some dried crusting blood left nares is clear Right nares has some bleeding in area of Hesselbach's plexus after clot is cleared this is more oozing.   Eyes: Pupils are equal, round, and reactive to light. Conjunctivae and EOM are normal.  Neck: Normal range of motion.  Cardiovascular: An irregularly irregular rhythm present.  Port right chest wall with no tenderness, or erythema noted  Pulmonary/Chest: Effort normal and breath sounds normal.  Abdominal: Soft. Bowel sounds are normal.  Musculoskeletal: Normal range of motion.  Neurological: She is alert and oriented to person, place, and time.  Skin: Skin is warm and dry. Capillary refill takes less than 2 seconds.  Psychiatric: She has a normal mood and affect.  Nursing note and vitals reviewed.    ED Treatments / Results  Labs (all labs ordered are listed, but only abnormal results are displayed) Labs Reviewed  CBC WITH DIFFERENTIAL/PLATELET  BASIC METABOLIC PANEL    EKG None  Radiology No results found.  Procedures .Epistaxis Management Date/Time: 02/05/2018 8:15 AM Performed by: Pattricia Boss, MD Authorized  by: Pattricia Boss, MD   Consent:    Consent obtained:  Verbal   Consent given by:  Patient   Risks discussed:  Bleeding and nasal injury   Alternatives discussed:  No treatment Anesthesia (see MAR for exact dosages):    Anesthesia method:  Topical application   Topical anesthesia: Lidocaine applied  Procedure details:    Treatment site:  R anterior   Treatment method:  Anterior pack (Silver nitrate attempted)   Treatment complexity:  Extensive   (including critical care time)  Medications Ordered in ED Medications  oxymetazoline (AFRIN) 0.05 % nasal spray 1 spray (has no administration in time range)  silver nitrate applicators applicator 1 application (has no administration in time range)  lidocaine (XYLOCAINE) 2 % (with pres) injection (has no administration in time range)     Initial Impression / Assessment and Plan / ED Course  I have reviewed the triage vital signs and the nursing notes.  Pertinent labs & imaging results that were available during my care of the patient were reviewed by me and considered in my medical decision making (see chart for details).    8:52 AM Bleeding controlled Toleration rhinorocket well Awaiting labs 9:33 AM Hgb with <1 gram decrease from 10.2 yesterday ti 9.6 today Discussed return precautions and need for close follow up  Final Clinical Impressions(s) / ED Diagnoses   Final diagnoses:  Epistaxis  Chronic anticoagulation    ED Discharge Orders    None       Pattricia Boss, MD 02/05/18 1645

## 2018-02-05 NOTE — ED Triage Notes (Addendum)
Pt states she had a nose bleed from 2330-0015 last night. Pt states that it stopped until about 0200 and has been going since. Pt received chemo yesterday for breast CA. Small amount of bleeding noted in triage. Controlled.

## 2018-02-07 ENCOUNTER — Telehealth: Payer: Self-pay | Admitting: Cardiology

## 2018-02-07 ENCOUNTER — Telehealth: Payer: Self-pay | Admitting: *Deleted

## 2018-02-07 ENCOUNTER — Telehealth: Payer: Self-pay | Admitting: Hematology

## 2018-02-07 NOTE — Telephone Encounter (Signed)
Received TC from patient this morning. She states she went to ED over the weekend for a nosebleed.  She states it was cauterized and packed.Marland Kitchen She was given a prescription for an antibiotic-Clindamycin.  She called here to make sure she can take the antibiotic, that it wont interfer with her chemo.. Pt gets Abraxane weekly at this time. Advised pt that it is ok for her to take the antibiotic at this time.  She voiced understanding.  She also states she will have the packing removed later this week and has made that appt. With the MD the ED recommended.  Denies bleeding, pain or fever at this time No other questions or concerns.

## 2018-02-07 NOTE — Telephone Encounter (Signed)
No los 10/4 

## 2018-02-07 NOTE — Telephone Encounter (Signed)
New Message:   Patient has some concerns about some medication and Pt also went to [employer]

## 2018-02-07 NOTE — Telephone Encounter (Signed)
Spoke with pt. Pt sts that she was seen in [facility] ED on 10//5 due to epistaxis. Her nose was cauterized and packed. She did not take her Eliquis on 10/5 due to her severe nose bleed ( she held Eliquis on her own), She forgot to take it on 10/6. She has not had any reoccur ence of epistaxis and wants to know if she should resume Eliquis today. Adv pt that I will fwd the message to our Pharm-D to advise.

## 2018-02-07 NOTE — Telephone Encounter (Signed)
I agree with the plan, thanks.   Truitt Merle MD

## 2018-02-07 NOTE — Telephone Encounter (Signed)
Pt aware of Pharm-D's instruction to resume  Eliquis 5mg  bid today as previously prescribed. Pt verbalized understanding and voiced appreciation for the assistance.

## 2018-02-07 NOTE — Telephone Encounter (Signed)
please resume Eliquis today at previously stable dose

## 2018-02-11 ENCOUNTER — Inpatient Hospital Stay: Payer: Medicare Other

## 2018-02-11 ENCOUNTER — Telehealth: Payer: Self-pay | Admitting: Hematology

## 2018-02-11 ENCOUNTER — Encounter: Payer: Self-pay | Admitting: Nurse Practitioner

## 2018-02-11 ENCOUNTER — Inpatient Hospital Stay: Payer: Medicare Other | Admitting: Nurse Practitioner

## 2018-02-11 VITALS — BP 118/81 | HR 82 | Temp 97.6°F | Resp 18 | Ht 61.0 in | Wt 134.8 lb

## 2018-02-11 DIAGNOSIS — C50412 Malignant neoplasm of upper-outer quadrant of left female breast: Secondary | ICD-10-CM | POA: Diagnosis not present

## 2018-02-11 DIAGNOSIS — Z5111 Encounter for antineoplastic chemotherapy: Secondary | ICD-10-CM | POA: Diagnosis not present

## 2018-02-11 DIAGNOSIS — T451X5A Adverse effect of antineoplastic and immunosuppressive drugs, initial encounter: Secondary | ICD-10-CM

## 2018-02-11 DIAGNOSIS — M8580 Other specified disorders of bone density and structure, unspecified site: Secondary | ICD-10-CM

## 2018-02-11 DIAGNOSIS — Z170 Estrogen receptor positive status [ER+]: Principal | ICD-10-CM

## 2018-02-11 DIAGNOSIS — Z171 Estrogen receptor negative status [ER-]: Secondary | ICD-10-CM | POA: Diagnosis not present

## 2018-02-11 DIAGNOSIS — Z7901 Long term (current) use of anticoagulants: Secondary | ICD-10-CM

## 2018-02-11 DIAGNOSIS — E119 Type 2 diabetes mellitus without complications: Secondary | ICD-10-CM

## 2018-02-11 DIAGNOSIS — D6481 Anemia due to antineoplastic chemotherapy: Secondary | ICD-10-CM

## 2018-02-11 DIAGNOSIS — C50411 Malignant neoplasm of upper-outer quadrant of right female breast: Secondary | ICD-10-CM | POA: Diagnosis not present

## 2018-02-11 DIAGNOSIS — D701 Agranulocytosis secondary to cancer chemotherapy: Secondary | ICD-10-CM | POA: Diagnosis not present

## 2018-02-11 DIAGNOSIS — R21 Rash and other nonspecific skin eruption: Secondary | ICD-10-CM

## 2018-02-11 DIAGNOSIS — D649 Anemia, unspecified: Secondary | ICD-10-CM

## 2018-02-11 DIAGNOSIS — E876 Hypokalemia: Secondary | ICD-10-CM

## 2018-02-11 DIAGNOSIS — Z95828 Presence of other vascular implants and grafts: Secondary | ICD-10-CM

## 2018-02-11 LAB — CBC WITH DIFFERENTIAL (CANCER CENTER ONLY)
Abs Immature Granulocytes: 0.25 10*3/uL — ABNORMAL HIGH (ref 0.00–0.07)
BASOS ABS: 0.1 10*3/uL (ref 0.0–0.1)
BASOS PCT: 2 %
Eosinophils Absolute: 0.1 10*3/uL (ref 0.0–0.5)
Eosinophils Relative: 2 %
HEMATOCRIT: 26.8 % — AB (ref 36.0–46.0)
Hemoglobin: 8.7 g/dL — ABNORMAL LOW (ref 12.0–15.0)
IMMATURE GRANULOCYTES: 6 %
Lymphocytes Relative: 28 %
Lymphs Abs: 1.2 10*3/uL (ref 0.7–4.0)
MCH: 32.3 pg (ref 26.0–34.0)
MCHC: 32.5 g/dL (ref 30.0–36.0)
MCV: 99.6 fL (ref 80.0–100.0)
MONOS PCT: 8 %
Monocytes Absolute: 0.3 10*3/uL (ref 0.1–1.0)
NEUTROS ABS: 2.5 10*3/uL (ref 1.7–7.7)
NEUTROS PCT: 54 %
NRBC: 1.6 % — AB (ref 0.0–0.2)
PLATELETS: 230 10*3/uL (ref 150–400)
RBC: 2.69 MIL/uL — AB (ref 3.87–5.11)
RDW: 17.5 % — ABNORMAL HIGH (ref 11.5–15.5)
WBC Count: 4.4 10*3/uL (ref 4.0–10.5)

## 2018-02-11 LAB — CMP (CANCER CENTER ONLY)
ALBUMIN: 3.4 g/dL — AB (ref 3.5–5.0)
ALT: 23 U/L (ref 0–44)
ANION GAP: 10 (ref 5–15)
AST: 21 U/L (ref 15–41)
Alkaline Phosphatase: 91 U/L (ref 38–126)
BILIRUBIN TOTAL: 0.4 mg/dL (ref 0.3–1.2)
BUN: 19 mg/dL (ref 8–23)
CO2: 22 mmol/L (ref 22–32)
Calcium: 8.9 mg/dL (ref 8.9–10.3)
Chloride: 106 mmol/L (ref 98–111)
Creatinine: 1.16 mg/dL — ABNORMAL HIGH (ref 0.44–1.00)
GFR, EST AFRICAN AMERICAN: 50 mL/min — AB (ref >60)
GFR, Estimated: 43 mL/min — ABNORMAL LOW (ref >60)
GLUCOSE: 155 mg/dL — AB (ref 70–99)
POTASSIUM: 3.2 mmol/L — AB (ref 3.5–5.1)
SODIUM: 138 mmol/L (ref 135–145)
TOTAL PROTEIN: 6.3 g/dL — AB (ref 6.5–8.1)

## 2018-02-11 MED ORDER — HEPARIN SOD (PORK) LOCK FLUSH 100 UNIT/ML IV SOLN
500.0000 [IU] | Freq: Once | INTRAVENOUS | Status: AC | PRN
  Administered 2018-02-11: 500 [IU]
  Filled 2018-02-11: qty 5

## 2018-02-11 MED ORDER — POTASSIUM CHLORIDE CRYS ER 20 MEQ PO TBCR
20.0000 meq | EXTENDED_RELEASE_TABLET | Freq: Once | ORAL | Status: AC
  Administered 2018-02-11: 20 meq via ORAL

## 2018-02-11 MED ORDER — PACLITAXEL PROTEIN-BOUND CHEMO INJECTION 100 MG
70.0000 mg/m2 | Freq: Once | INTRAVENOUS | Status: AC
  Administered 2018-02-11: 125 mg via INTRAVENOUS
  Filled 2018-02-11: qty 25

## 2018-02-11 MED ORDER — SODIUM CHLORIDE 0.9% FLUSH
10.0000 mL | Freq: Once | INTRAVENOUS | Status: AC
  Administered 2018-02-11: 10 mL
  Filled 2018-02-11: qty 10

## 2018-02-11 MED ORDER — POTASSIUM CHLORIDE CRYS ER 20 MEQ PO TBCR
EXTENDED_RELEASE_TABLET | ORAL | Status: AC
  Filled 2018-02-11: qty 1

## 2018-02-11 MED ORDER — SODIUM CHLORIDE 0.9 % IV SOLN
Freq: Once | INTRAVENOUS | Status: AC
  Administered 2018-02-11: 12:00:00 via INTRAVENOUS
  Filled 2018-02-11: qty 250

## 2018-02-11 MED ORDER — PROCHLORPERAZINE MALEATE 10 MG PO TABS
10.0000 mg | ORAL_TABLET | Freq: Once | ORAL | Status: AC
  Administered 2018-02-11: 10 mg via ORAL

## 2018-02-11 MED ORDER — SODIUM CHLORIDE 0.9% FLUSH
10.0000 mL | INTRAVENOUS | Status: DC | PRN
  Administered 2018-02-11: 10 mL
  Filled 2018-02-11: qty 10

## 2018-02-11 MED ORDER — PROCHLORPERAZINE MALEATE 10 MG PO TABS
ORAL_TABLET | ORAL | Status: AC
  Filled 2018-02-11: qty 1

## 2018-02-11 NOTE — Telephone Encounter (Signed)
Appts scheduled AVS/Calendar printed/ Blood appt scheduled for 10/14 at [facility] / per 10/11 los

## 2018-02-11 NOTE — Patient Instructions (Signed)
[facility] Discharge Instructions for Patients Receiving Chemotherapy  Today you received the following chemotherapy agents:  Abraxane.  To help prevent nausea and vomiting after your treatment, we encourage you to take your nausea medication as directed.   If you develop nausea and vomiting that is not controlled by your nausea medication, call the clinic.   BELOW ARE SYMPTOMS THAT SHOULD BE REPORTED IMMEDIATELY:  *FEVER GREATER THAN 100.5 F  *CHILLS WITH OR WITHOUT FEVER  NAUSEA AND VOMITING THAT IS NOT CONTROLLED WITH YOUR NAUSEA MEDICATION  *UNUSUAL SHORTNESS OF BREATH  *UNUSUAL BRUISING OR BLEEDING  TENDERNESS IN MOUTH AND THROAT WITH OR WITHOUT PRESENCE OF ULCERS  *URINARY PROBLEMS  *BOWEL PROBLEMS  UNUSUAL RASH Items with * indicate a potential emergency and should be followed up as soon as possible.  Feel free to call the clinic should you have any questions or concerns. The clinic phone number is [phone].  Please show the CHEMO ALERT CARD at check-in to the Emergency Department and triage nurse.   

## 2018-02-11 NOTE — Progress Notes (Signed)
[place]  [phone] [phone]  Clinic Follow up Note   Patient Care Team: [name], NP as PCP - General (Family Medicine) Lelon Perla, MD as PCP - Cardiology (Cardiology) 02/11/2018  SUMMARY OF ONCOLOGIC HISTORY: Oncology History   Cancer Staging Breast cancer of upper-outer quadrant of right female breast [facility]) Staging form: Breast, AJCC 8th Edition - Clinical stage from 09/22/2017: Stage IB (cT1b, cN0, cM0, G2, ER-, PR-, HER2-) - Signed by Truitt Merle, MD on 10/08/2017  Breast cancer, left breast [facility]) Staging form: Breast, AJCC 7th Edition - Clinical stage from 09/12/2012: Stage IA (T1b, N0, M0) - Unsigned        Breast cancer, left breast ([place])   08/25/2012 Receptors her2    ER 100% positive, PR 88% positive, HER-2 negative, Ki-67 16%    08/30/2012 Initial Diagnosis    Breast cancer, left breast    09/12/2012 Pathology Results    T1bN0 Grade 1 invasive ductal carcinoma, and DCIS.    09/12/2012 Surgery    Left breast lumpectomy and sentinel lymph node biopsy, negative margins.    11/09/2012 - 12/13/2012 Radiation Therapy    Adjuvant breast radiation    12/2012 - 11/2017 Anti-estrogen oral therapy    Anastrozole 1 mg daily, switched to Aromasin 25 mg daily in Jan 2015 due to diarrhea. Her Exemestane was switched to letrozole in 08/2017 due to high copay. She completed her 5 years in 11/2017.     09/01/2016 Imaging    [name] Outside films Breast form [facility] 09/01/2016 IMPRESSION: Probably Benign 1. No significant change in oval hypoechoic masses in the left breast at 11:00 2 cm from the nipple and 10:30 4 cm from the nipple. Both of these masses resemble the area of fat necrosis previously biopsies at 2:00. In addition, both masses have developing central benign or dystrophic appearing calcifications and are favored to be other areas of fat necrosis. A 6 month follow-up left breast mammogram and ultrasound is recommended.    09/01/2016  Mammogram    [facility] Mammogram from [facility] 09/01/16 IMPRESSION  Incomplete - additional imaging evaluation needed Ultrasound of the upper medial left breast mass is recommended.     03/09/2017 Mammogram    Previous lumpectomy changes in the upper outer left breast anterior depth with stable associated dystrophic calcifications.  Biopsy clip at the 2:00 in the left breast near the lumpectomy site corresponding to previously biopsied benign fat necrosis.  Persistent round mass in the upper medial left breast with benign-appearing central round calcification.  No significant masses, calcifications, or other findings are seen in the breast  Impression: ultrasound is recommended for the left breast    03/09/2017 Breast US    Left breast ultrasound impression:  No significant change in oval hypoechoic masses in the left breast at 11:00 2 cm from the nipple and 10:30 4 cm from the nipple.  Both of these masses resemble the area of fat necrosis previously biopsied at 2:00.  In addition, both masses have developing central benign or dystrophic appearing calcifications and are favored to be other areas of fat necrosis.  A 75-monthfollow-up bilateral mammogram and left breast ultrasound is recommended.    09/14/2017 Breast [place]   Breast [place] 09[place]14[place]17 at [facility]  IMPRESSION:  The 1 cm oval mass in the left breast at 9:30 posterior depth is highly suggestive of malignancy. An Ultrasound guided biopsy is recommended.  The 0.8 cm round mass in the left breast at 11-12  o'clock anterior depth is suspicious of malignancy. An ultrasound guided biopsy is recommended.     09/22/2017 Pathology Results    Diagnosis 1. Breast, right, needle core biopsy - INVASIVE DUCTAL CARCINOMA, MSBR GRADE II/III. - DUCTAL CARCINOMA IN SITU WITH NECROSIS. 2. Breast, left, needle core biopsy - FAT NECROSIS. - NO EVIDENCE OF MALIGNANCY.    10/17/2017 Genetic Testing    Negative genetic testing on the Multicancer panel.  The  Multi-Gene Panel offered by [facility] includes sequencing and/or deletion duplication testing of the following 83 genes: ALK, APC, ATM, AXIN2,BAP1,  BARD1, BLM, BMPR1A, BRCA1, BRCA2, BRIP1, CASR, CDC73, CDH1, CDK4, CDKN1B, CDKN1C, CDKN2A (p14ARF), CDKN2A (p16INK4a), CEBPA, CHEK2, CTNNA1, DICER1, DIS3L2, EGFR (c.2369C>T, p.Thr790Met variant only), EPCAM (Deletion/duplication testing only), FH, FLCN, GATA2, GPC3, GREM1 (Promoter region deletion/duplication testing only), HOXB13 (c.251G>A, p.Gly84Glu), HRAS, KIT, MAX, MEN1, MET, MITF (c.952G>A, p.Glu318Lys variant only), MLH1, MSH2, MSH3, MSH6, MUTYH, NBN, NF1, NF2, NTHL1, PALB2, PDGFRA, PHOX2B, PMS2, POLD1, POLE, POT1, PRKAR1A, PTCH1, PTEN, RAD50, RAD51C, RAD51D, RB1, RECQL4, RET, RUNX1, SDHAF2, SDHA (sequence changes only), SDHB, SDHC, SDHD, SMAD4, SMARCA4, SMARCB1, SMARCE1, STK11, SUFU, TERT, TERT, TMEM127, TP53, TSC1, TSC2, VHL, WRN and WT1.  The report date is October 17, 2017.     Breast cancer of upper-outer quadrant of right female breast ([place])   09/14/2017 Mammogram    Diagnostic mammogram at [facility]  IMPRESSION:  The new 0.9 cm oval high density mass in the right breast posterior depth superior region seen on the mediolateral oblique view only is indeterminate. The 1.1 cm focal asymmetry in the left breast central to the nipple anterior depth is indeterminate.     09/14/2017 Imaging    [name] Bilateral at [facility] IMPRESSION: The 1 cm oval mass in the right breast at 9:30 posterior depth is highly suggestive of malignancy. The 0.8 cm round mass in the left breast at 11-12 o'clock anterior depth is suspicious of malignancy.     09/22/2017 Cancer Staging    Staging form: Breast, AJCC 8th Edition - Clinical stage from 09/22/2017: Stage IB (cT1b, cN0, cM0, G2, ER-, PR-, HER2-) - Signed by Truitt Merle, MD on 10/08/2017    09/22/2017 Pathology Results    Bilateral needle core biopsy 1. Breast, right, needle core biopsy - INVASIVE DUCTAL CARCINOMA, MSBR GRADE  II/III. - DUCTAL CARCINOMA IN SITU WITH NECROSIS. 2. Breast, left, needle core biopsy - FAT NECROSIS. - NO EVIDENCE OF MALIGNANCY.    09/25/2017 Receptors her2    Estrogen Receptor: 0 Progesterone 0 HER2: Negative. Ki-67: 80%     10/08/2017 Initial Diagnosis    Breast cancer of upper-outer quadrant of right female breast ([place])    10/26/2017 Surgery    BREAST LUMPECTOMY WITH RADIOACTIVE SEED AND SENTINEL LYMPH NODE BIOPSY by Dr. Excell Seltzer  10/26/17    10/26/2017 Pathology Results    Diagnosis 10/26/17 1. Breast, lumpectomy, Right - INVASIVE DUCTAL CARCINOMA, GRADE 2, SPANNING 1 CM. - HIGH GRADE DUCTAL CARCINOMA IN SITU WITH NECROSIS. - FINAL RESECTION MARGINS (PARTS #2, 3, 4) ARE NEGATIVE. - BIOPSY SITE. - SEE ONCOLOGY TABLE. 2. Breast, excision, additional anterior margin- 2 pieces right - BENIGN BREAST TISSUE. 3. Breast, excision, Right unoriented anterior margin - BENIGN BREAST TISSUE. 4. Breast, excision, Right deep margin - BENIGN BREAST TISSUE. 5. Lymph node, sentinel, biopsy, Right Axillary - ONE OF ONE LYMPH NODES NEGATIVE FOR CARCINOMA (0/1).    10/26/2017 Cancer Staging    Staging form: Breast, AJCC 8th Edition - Pathologic stage from 10/26/2017: Stage IB (pT1b,  pN0, cM0, G2, ER-, PR-, HER2-) - Signed by Truitt Merle, MD on 11/12/2017    12/03/2017 -  Chemotherapy    Weekly Abraxane starting 12/03/17   CURRENT THERAPY:  -Adjuvant weekly Abraxane for 12 weeks starting on 12/03/17   INTERVAL HISTORY: Ms. Ramnath returns for follow up and cycle 10 abraxane as scheduled. She completed 9th weekly dose on 10/4. On 10/5 she presented to ED with epistaxis. She held eliquis. It was cauterized and packed. She was discharged home with ENT f/u, she saw Dr. Redmond Baseman this week. She uses saline spray once daily and is nearly completed clindamycin. She has not had recurrent episode. Denies other bleeding such as hematochezia. She resumed eliquis. Her activity level has been reduced this week due  to discomfort and appearance of the packing. She has slightly DOE, denies cough, chest pain, fever or chills. Appetite is normal. Denies mouth sores. No n/v/c/d. Denies neuropathy.    MEDICAL HISTORY:  Past Medical History:  Diagnosis Date  . Allergy   . Atrial fibrillation ([place])   . Breast cancer ([place]) 08/25/12   Invasive ductal ca,DCIS  . Diabetes mellitus    type II  . Family history of cancer   . Gout   . Hearing loss   . History of breast cancer   . History of frequent urinary tract infections   . Hx of radiation therapy 11/22/12- 12/19/12   breast 4250 cGy 17 sessions, left breast boost 750 cGy 3 sessions  . Hyperlipidemia   . Hypertension   . Osteopenia     SURGICAL HISTORY: Past Surgical History:  Procedure Laterality Date  . ABDOMINAL HYSTERECTOMY Bilateral 1999   w/b/l salpingo-oopherectomy  . APPENDECTOMY    . BREAST LUMPECTOMY WITH NEEDLE LOCALIZATION AND AXILLARY SENTINEL LYMPH NODE BX Left 09/12/2012   Procedure: LEFT NEEDLE LOCALIZATION BREAST LUMPECTOMY TION AND AXILLARY SENTINEL LYMPH NODE BX;  Surgeon: Edward Jolly, MD;  Location: [place];  Service: General;  Laterality: Left;  . BREAST LUMPECTOMY WITH RADIOACTIVE SEED AND SENTINEL LYMPH NODE BIOPSY Right 10/26/2017   Procedure: BREAST LUMPECTOMY WITH RADIOACTIVE SEED AND SENTINEL LYMPH NODE BIOPSY;  Surgeon: Excell Seltzer, MD;  Location: [place];  Service: General;  Laterality: Right;  . BREAST SURGERY  1990's   right- fibroid cyst  . CHOLECYSTECTOMY    . SEPTOPLASTY    . TONSILLECTOMY      I have reviewed the social history and family history with the patient and they are unchanged from previous note.  ALLERGIES:  is allergic to penicillins; other; prednisone; radiaplexrx [woun'dres hydrogel wound dress]; and sulfamethoxazole.  MEDICATIONS:  Current Outpatient Medications  Medication Sig Dispense Refill  . ACCU-CHEK AVIVA PLUS test strip USE TO TEST TWICE DAILY **ICD-10 CODE  E13.9** 200 each 3  . allopurinol (ZYLOPRIM) 300 MG tablet TAKE 1 TABLET (300 MG TOTAL) BY MOUTH DAILY. **DNF 04/06/15** 90 tablet 3  . atenolol (TENORMIN) 25 MG tablet TAKE 1 TABLET BY MOUTH EVERY DAY 90 tablet 2  . chlorpheniramine (CHLOR-TRIMETON) 4 MG tablet Take 4 mg 2 (two) times daily as needed by mouth for allergies.    . clindamycin (CLEOCIN) 150 MG capsule Take 1 capsule (150 mg total) by mouth every 6 (six) hours. 28 capsule 0  . diltiazem (CARDIZEM CD) 360 MG 24 hr capsule TAKE 1 CAPSULE (360 MG TOTAL) BY MOUTH DAILY. 90 capsule 3  . ELIQUIS 5 MG TABS tablet TAKE 1 TABLET BY MOUTH TWICE A DAY 180 tablet 1  .  fish oil-omega-3 fatty acids 1000 MG capsule Take 1 g by mouth daily.    Marland Kitchen lidocaine-prilocaine (EMLA) cream Apply 1 application topically as needed. 30 g 3  . metFORMIN (GLUCOPHAGE) 500 MG tablet Take 1 tablet (500 mg total) by mouth 2 (two) times daily. 180 tablet 3  . Multiple Vitamin (MULTIVITAMIN) tablet Take 1 tablet by mouth daily.    Marland Kitchen omega-3 acid ethyl esters (LOVAZA) 1 g capsule Take 1 capsule by mouth daily.    Marland Kitchen OVER THE COUNTER MEDICATION Take by mouth 2 (two) times daily. Presavision- for macular degeneration prevention    . VITAMIN D, ERGOCALCIFEROL, PO Take by mouth.    . loperamide (IMODIUM) 2 MG capsule Take 2 mg by mouth as needed for diarrhea or loose stools.    . magic mouthwash w/lidocaine SOLN Take 5 mLs by mouth 3 (three) times daily. Swish and spit (Patient not taking: Reported on 02/11/2018) 240 mL 0  . ondansetron (ZOFRAN) 8 MG tablet Take 1 tablet (8 mg total) by mouth 2 (two) times daily as needed (Nausea or vomiting). (Patient not taking: Reported on 02/11/2018) 30 tablet 1  . prochlorperazine (COMPAZINE) 10 MG tablet Take 1 tablet (10 mg total) by mouth every 6 (six) hours as needed (Nausea or vomiting). (Patient not taking: Reported on 02/11/2018) 30 tablet 1  . triamcinolone lotion (KENALOG) 0.1 % Apply 1 application topically 3 (three) times daily.  (Patient not taking: Reported on 02/11/2018) 60 mL 1   No current facility-administered medications for this visit.    Facility-Administered Medications Ordered in Other Visits  Medication Dose Route Frequency Provider Last Rate Last Dose  . heparin lock flush 100 unit/mL  500 Units Intracatheter Once PRN Truitt Merle, MD      . sodium chloride flush (NS) 0.9 % injection 10 mL  10 mL Intracatheter PRN Truitt Merle, MD        PHYSICAL EXAMINATION: ECOG PERFORMANCE STATUS: 1 - Symptomatic but completely ambulatory  Vitals:   02/11/18 1022  BP: 118/81  Pulse: 82  Resp: 18  Temp: 97.6 F (36.4 C)  SpO2: 98%   Filed Weights   02/11/18 1022  Weight: 134 lb 12.8 oz (61.1 kg)    GENERAL:alert, no distress and comfortable SKIN:  no rashes or significant lesions EYES: sclera clear OROPHARYNX:no thrush or ulcers   LYMPH:  no palpable cervical or axillary lymphadenopathy  LUNGS: clear to auscultation with normal breathing effort HEART: regular rate & rhythm, no lower extremity edema ABDOMEN:abdomen soft, non-tender and normal bowel sounds Musculoskeletal:no cyanosis of digits and no clubbing  NEURO: alert & oriented x 3 with fluent speech, no focal motor/sensory deficits Breast exam deferred  PAC without erythema    LABORATORY DATA:  I have reviewed the data as listed CBC Latest Ref Rng & Units 02/11/2018 02/05/2018 02/04/2018  WBC 4.0 - 10.5 K/uL 4.4 3.6(L) 4.6  Hemoglobin 12.0 - 15.0 g/dL 8.7(L) 9.6(L) 10.2(L)  Hematocrit 36.0 - 46.0 % 26.8(L) 29.0(L) 31.1(L)  Platelets 150 - 400 K/uL 230 217 207     CMP Latest Ref Rng & Units 02/11/2018 02/05/2018 02/04/2018  Glucose 70 - 99 mg/dL 155(H) 140(H) 137(H)  BUN 8 - 23 mg/dL 19 26(H) 16  Creatinine 0.44 - 1.00 mg/dL 1.16(H) 1.01(H) 0.99  Sodium 135 - 145 mmol/L 138 143 141  Potassium 3.5 - 5.1 mmol/L 3.2(L) 3.6 3.8  Chloride 98 - 111 mmol/L 106 109 107  CO2 22 - 32 mmol/L _0 Calcium 8.9 -  10.3 mg/dL 8.9 9.5 9.4  Total Protein  6.5 - 8.1 g/dL 6.3(L) - 6.5  Total Bilirubin 0.3 - 1.2 mg/dL 0.4 - 0.5  Alkaline Phos 38 - 126 U/L 91 - 107  AST 15 - 41 U/L 21 - 21  ALT 0 - 44 U/L 23 - 25      RADIOGRAPHIC STUDIES: I have personally reviewed the radiological images as listed and agreed with the findings in the report. No results found.   ASSESSMENT & PLAN: [id number], [place] woman   1. Breast cancer of upper outer quadrant of right breast, invasive ductal carcinoma, stage IB, pT1bN0M0, grade 2, Triple Negative, Ki67: 80% 2. pT1bN0, stage IA invasive ductal carcinoma of the left breast, grade I, ER 100%, PR 88%, Ki-67 16%, HER-2/neu negative (2014) 3. Osteopenia, on vitamin D;DEXA scan on 09/20/2017 showed: Osteopenia, very high risk of fracture, 29% for major osteoporotic fracture and 18% for hip fracture 4. Hypercalcemia 5. DM 6. Social support 7. Chemotherapy induced neutropenia, stable with dose reduction to abraxane 70 mg/m2 8. Skin rash,suspected secondary to abraxane 9. Mucositis, grade 2, secondary to chemotherapy, resolved  10. Epistaxis, on eliquis; s/p cauterization and packing on 10/5   Ms. Tallon appears stable. She completed cycle 3 day 15 (9th weekly) abraxane. She continues to tolerate treatment well. She had epistaxis and went to ED after last treatment, was cauterized and packed. She has followed up with ENT, no recurrent bleeding. She remains on eliquis. Labs reviewed, she has worsening anemia, Hgb 8.7; she may need blood transfusion in the near future. Due to her upcoming vacation next week, will bring her in for CBC and potentially 2 units RBCs on 8/14 before she leaves town. I reviewed risks and benefit of RBC transfusion, she consented. For mild hypokalemia will give 20 mEq po K x1 in infusion today, she will increase potassium in her diet. Labs otherwise adequate to proceed with cycle 4 day 1 (10th weekly dose) abraxane today at current dose. Will send a message to Dr. Isidore Moos to see her  back after she completes adjuvant chemo in 2 weeks. F/u in 1 week.   PLAN: -Labs reviewed, proceed with cycle 4 day 1 (10th weekly dose) abraxane, no dosage adjustment  -CBC and 2 units RBCs on 10/14 -20 mEq potassium po x1 today in infusion  -F/u next week, she will have 2 more weekly doses after this, then proceed to adjuvant RT, message sent to Dr. Isidore Moos    Orders Placed This Encounter  Procedures  . Draw extra clot specimen    Standing Status:   Future    Standing Expiration Date:   02/12/2019   All questions were answered. The patient knows to call the clinic with any problems, questions or concerns. No barriers to learning was detected.     Alla Feeling, NP 02/11/18

## 2018-02-14 ENCOUNTER — Inpatient Hospital Stay: Payer: Medicare Other

## 2018-02-14 ENCOUNTER — Encounter (HOSPITAL_COMMUNITY): Payer: Self-pay

## 2018-02-14 ENCOUNTER — Other Ambulatory Visit: Payer: Self-pay

## 2018-02-14 ENCOUNTER — Ambulatory Visit (HOSPITAL_COMMUNITY)
Admission: RE | Admit: 2018-02-14 | Discharge: 2018-02-14 | Disposition: A | Discharge status: Home / Self Care | Payer: Medicare Other | Source: Ambulatory Visit | Attending: Hematology | Admitting: Hematology

## 2018-02-14 DIAGNOSIS — Z5111 Encounter for antineoplastic chemotherapy: Secondary | ICD-10-CM | POA: Diagnosis not present

## 2018-02-14 DIAGNOSIS — C50412 Malignant neoplasm of upper-outer quadrant of left female breast: Secondary | ICD-10-CM

## 2018-02-14 DIAGNOSIS — D6481 Anemia due to antineoplastic chemotherapy: Secondary | ICD-10-CM

## 2018-02-14 DIAGNOSIS — T451X5A Adverse effect of antineoplastic and immunosuppressive drugs, initial encounter: Principal | ICD-10-CM

## 2018-02-14 DIAGNOSIS — C50411 Malignant neoplasm of upper-outer quadrant of right female breast: Secondary | ICD-10-CM

## 2018-02-14 DIAGNOSIS — Z170 Estrogen receptor positive status [ER+]: Secondary | ICD-10-CM

## 2018-02-14 DIAGNOSIS — Z171 Estrogen receptor negative status [ER-]: Principal | ICD-10-CM

## 2018-02-14 LAB — COMPREHENSIVE METABOLIC PANEL
ALBUMIN: 3.7 g/dL (ref 3.5–5.0)
ALK PHOS: 77 U/L (ref 38–126)
ALT: 23 U/L (ref 0–44)
ANION GAP: 10 (ref 5–15)
AST: 25 U/L (ref 15–41)
BUN: 18 mg/dL (ref 8–23)
CO2: 23 mmol/L (ref 22–32)
Calcium: 9.1 mg/dL (ref 8.9–10.3)
Chloride: 108 mmol/L (ref 98–111)
Creatinine, Ser: 0.9 mg/dL (ref 0.44–1.00)
GFR calc Af Amer: >60 mL/min (ref >60)
GFR calc non Af Amer: 58 mL/min — ABNORMAL LOW (ref >60)
GLUCOSE: 164 mg/dL — AB (ref 70–99)
POTASSIUM: 4.2 mmol/L (ref 3.5–5.1)
SODIUM: 141 mmol/L (ref 135–145)
Total Bilirubin: 0.8 mg/dL (ref 0.3–1.2)
Total Protein: 6.3 g/dL — ABNORMAL LOW (ref 6.5–8.1)

## 2018-02-14 LAB — CBC WITH DIFFERENTIAL (CANCER CENTER ONLY)
Abs Immature Granulocytes: 0.06 10*3/uL (ref 0.00–0.07)
BASOS PCT: 1 %
Basophils Absolute: 0 10*3/uL (ref 0.0–0.1)
EOS ABS: 0 10*3/uL (ref 0.0–0.5)
Eosinophils Relative: 1 %
HEMATOCRIT: 27.6 % — AB (ref 36.0–46.0)
HEMOGLOBIN: 8.9 g/dL — AB (ref 12.0–15.0)
IMMATURE GRANULOCYTES: 2 %
LYMPHS ABS: 0.8 10*3/uL (ref 0.7–4.0)
Lymphocytes Relative: 24 %
MCH: 32.6 pg (ref 26.0–34.0)
MCHC: 32.2 g/dL (ref 30.0–36.0)
MCV: 101.1 fL — ABNORMAL HIGH (ref 80.0–100.0)
MONO ABS: 0.1 10*3/uL (ref 0.1–1.0)
MONOS PCT: 4 %
NEUTROS PCT: 68 %
Neutro Abs: 2.2 10*3/uL (ref 1.7–7.7)
Platelet Count: 210 10*3/uL (ref 150–400)
RBC: 2.73 MIL/uL — ABNORMAL LOW (ref 3.87–5.11)
RDW: 17.9 % — ABNORMAL HIGH (ref 11.5–15.5)
WBC Count: 3.2 10*3/uL — ABNORMAL LOW (ref 4.0–10.5)
nRBC: 0.9 % — ABNORMAL HIGH (ref 0.0–0.2)

## 2018-02-14 LAB — TYPE AND SCREEN
ABO/RH(D): A POS
ANTIBODY SCREEN: NEGATIVE

## 2018-02-14 LAB — ABO/RH: ABO/RH(D): A POS

## 2018-02-14 NOTE — Progress Notes (Signed)
Hgb result 8.9.  Patient stated that she feels fine and that she did not think she needed the blood.  Spoke with Dr. Burr Medico and advised her of the same.  She stated it was OK to not transfuse patient at this time.  Patient left the clinic ambulating without assistance.

## 2018-02-15 ENCOUNTER — Encounter: Payer: Self-pay | Admitting: Radiation Oncology

## 2018-02-17 NOTE — Progress Notes (Signed)
[place]  [phone]) [phone] Fax:([phone]) [phone]  Clinic Follow up Note   Patient Care Team: [name], NP as PCP - General (Family Medicine) Lelon Perla, MD as PCP - Cardiology (Cardiology) 02/18/2018  SUMMARY OF ONCOLOGIC HISTORY: Oncology History   Cancer Staging Breast cancer of upper-outer quadrant of right female breast [facility]) Staging form: Breast, AJCC 8th Edition - Clinical stage from 09/22/2017: Stage IB (cT1b, cN0, cM0, G2, ER-, PR-, HER2-) - Signed by Truitt Merle, MD on 10/08/2017  Breast cancer, left breast [facility]) Staging form: Breast, AJCC 7th Edition - Clinical stage from 09/12/2012: Stage IA (T1b, N0, M0) - Unsigned        Breast cancer, left breast ([place])   08/25/2012 Receptors her2    ER 100% positive, PR 88% positive, HER-2 negative, Ki-67 16%    08/30/2012 Initial Diagnosis    Breast cancer, left breast    09/12/2012 Pathology Results    T1bN0 Grade 1 invasive ductal carcinoma, and DCIS.    09/12/2012 Surgery    Left breast lumpectomy and sentinel lymph node biopsy, negative margins.    11/09/2012 - 12/13/2012 Radiation Therapy    Adjuvant breast radiation    12/2012 - 11/2017 Anti-estrogen oral therapy    Anastrozole 1 mg daily, switched to Aromasin 25 mg daily in Jan 2015 due to diarrhea. Her Exemestane was switched to letrozole in 08/2017 due to high copay. She completed her 5 years in 11/2017.     09/01/2016 Imaging    [name] Outside films Breast form [facility] 09/01/2016 IMPRESSION: Probably Benign 1. No significant change in oval hypoechoic masses in the left breast at 11:00 2 cm from the nipple and 10:30 4 cm from the nipple. Both of these masses resemble the area of fat necrosis previously biopsies at 2:00. In addition, both masses have developing central benign or dystrophic appearing calcifications and are favored to be other areas of fat necrosis. A 6 month follow-up left breast mammogram and ultrasound is recommended.    09/01/2016  Mammogram    HM Mammogram from [facility] 09/01/16 IMPRESSION  Incomplete - additional imaging evaluation needed Ultrasound of the upper medial left breast mass is recommended.     03/09/2017 Mammogram    Previous lumpectomy changes in the upper outer left breast anterior depth with stable associated dystrophic calcifications.  Biopsy clip at the 2:00 in the left breast near the lumpectomy site corresponding to previously biopsied benign fat necrosis.  Persistent round mass in the upper medial left breast with benign-appearing central round calcification.  No significant masses, calcifications, or other findings are seen in the breast  Impression: ultrasound is recommended for the left breast    03/09/2017 Breast US    Left breast ultrasound impression:  No significant change in oval hypoechoic masses in the left breast at 11:00 2 cm from the nipple and 10:30 4 cm from the nipple.  Both of these masses resemble the area of fat necrosis previously biopsied at 2:00.  In addition, both masses have developing central benign or dystrophic appearing calcifications and are favored to be other areas of fat necrosis.  A 52-monthfollow-up bilateral mammogram and left breast ultrasound is recommended.    09/14/2017 Breast [place]   Breast [place] 09[place]14[place]17 at [facility]  IMPRESSION:  The 1 cm oval mass in the left breast at 9:30 posterior depth is highly suggestive of malignancy. An Ultrasound guided biopsy is recommended.  The 0.8 cm round mass in the left breast at 11-12  o'clock anterior depth is suspicious of malignancy. An ultrasound guided biopsy is recommended.     09/22/2017 Pathology Results    Diagnosis 1. Breast, right, needle core biopsy - INVASIVE DUCTAL CARCINOMA, MSBR GRADE II/III. - DUCTAL CARCINOMA IN SITU WITH NECROSIS. 2. Breast, left, needle core biopsy - FAT NECROSIS. - NO EVIDENCE OF MALIGNANCY.    10/17/2017 Genetic Testing    Negative genetic testing on the Multicancer panel.  The  Multi-Gene Panel offered by [facility] includes sequencing and/or deletion duplication testing of the following 83 genes: ALK, APC, ATM, AXIN2,BAP1,  BARD1, BLM, BMPR1A, BRCA1, BRCA2, BRIP1, CASR, CDC73, CDH1, CDK4, CDKN1B, CDKN1C, CDKN2A (p14ARF), CDKN2A (p16INK4a), CEBPA, CHEK2, CTNNA1, DICER1, DIS3L2, EGFR (c.2369C>T, p.Thr790Met variant only), EPCAM (Deletion/duplication testing only), FH, FLCN, GATA2, GPC3, GREM1 (Promoter region deletion/duplication testing only), HOXB13 (c.251G>A, p.Gly84Glu), HRAS, KIT, MAX, MEN1, MET, MITF (c.952G>A, p.Glu318Lys variant only), MLH1, MSH2, MSH3, MSH6, MUTYH, NBN, NF1, NF2, NTHL1, PALB2, PDGFRA, PHOX2B, PMS2, POLD1, POLE, POT1, PRKAR1A, PTCH1, PTEN, RAD50, RAD51C, RAD51D, RB1, RECQL4, RET, RUNX1, SDHAF2, SDHA (sequence changes only), SDHB, SDHC, SDHD, SMAD4, SMARCA4, SMARCB1, SMARCE1, STK11, SUFU, TERT, TERT, TMEM127, TP53, TSC1, TSC2, VHL, WRN and WT1.  The report date is October 17, 2017.     Breast cancer of upper-outer quadrant of right female breast ([place])   09/14/2017 Mammogram    Diagnostic mammogram at [facility]  IMPRESSION:  The new 0.9 cm oval high density mass in the right breast posterior depth superior region seen on the mediolateral oblique view only is indeterminate. The 1.1 cm focal asymmetry in the left breast central to the nipple anterior depth is indeterminate.     09/14/2017 Imaging    [name] Bilateral at [facility] IMPRESSION: The 1 cm oval mass in the right breast at 9:30 posterior depth is highly suggestive of malignancy. The 0.8 cm round mass in the left breast at 11-12 o'clock anterior depth is suspicious of malignancy.     09/22/2017 Cancer Staging    Staging form: Breast, AJCC 8th Edition - Clinical stage from 09/22/2017: Stage IB (cT1b, cN0, cM0, G2, ER-, PR-, HER2-) - Signed by Truitt Merle, MD on 10/08/2017    09/22/2017 Pathology Results    Bilateral needle core biopsy 1. Breast, right, needle core biopsy - INVASIVE DUCTAL CARCINOMA, MSBR GRADE  II/III. - DUCTAL CARCINOMA IN SITU WITH NECROSIS. 2. Breast, left, needle core biopsy - FAT NECROSIS. - NO EVIDENCE OF MALIGNANCY.    09/25/2017 Receptors her2    Estrogen Receptor: 0 Progesterone 0 HER2: Negative. Ki-67: 80%     10/08/2017 Initial Diagnosis    Breast cancer of upper-outer quadrant of right female breast ([place])    10/26/2017 Surgery    BREAST LUMPECTOMY WITH RADIOACTIVE SEED AND SENTINEL LYMPH NODE BIOPSY by Dr. Excell Seltzer  10/26/17    10/26/2017 Pathology Results    Diagnosis 10/26/17 1. Breast, lumpectomy, Right - INVASIVE DUCTAL CARCINOMA, GRADE 2, SPANNING 1 CM. - HIGH GRADE DUCTAL CARCINOMA IN SITU WITH NECROSIS. - FINAL RESECTION MARGINS (PARTS #2, 3, 4) ARE NEGATIVE. - BIOPSY SITE. - SEE ONCOLOGY TABLE. 2. Breast, excision, additional anterior margin- 2 pieces right - BENIGN BREAST TISSUE. 3. Breast, excision, Right unoriented anterior margin - BENIGN BREAST TISSUE. 4. Breast, excision, Right deep margin - BENIGN BREAST TISSUE. 5. Lymph node, sentinel, biopsy, Right Axillary - ONE OF ONE LYMPH NODES NEGATIVE FOR CARCINOMA (0/1).    10/26/2017 Cancer Staging    Staging form: Breast, AJCC 8th Edition - Pathologic stage from 10/26/2017: Stage IB (pT1b,  pN0, cM0, G2, ER-, PR-, HER2-) - Signed by Truitt Merle, MD on 11/12/2017    12/03/2017 -  Chemotherapy    Weekly Abraxane starting 12/03/17   CURRENT THERAPY:  -Adjuvant weekly Abraxane for 12 weeks starting on 12/03/17  INTERVAL HISTORY: Ms. Simmon returns with her son as scheduled. She completed cycle 4 day 1 (10th weekly dose) abraxane last week. She went on a trip to the beach with friends since last infusion. She has felt well. Appetite is normal. Has mild but stable fatigue. Denies n/v/c/d. No recurrent bleeding. Denies fever, chills, cough, chest pain, dyspnea, or neuropathy.    MEDICAL HISTORY:  Past Medical History:  Diagnosis Date  . Allergy   . Atrial fibrillation ([place])   . Breast cancer ([place])  08/25/12   Invasive ductal ca,DCIS  . Diabetes mellitus    type II  . Family history of cancer   . Gout   . Hearing loss   . History of breast cancer   . History of frequent urinary tract infections   . Hx of radiation therapy 11/22/12- 12/19/12   breast 4250 cGy 17 sessions, left breast boost 750 cGy 3 sessions  . Hyperlipidemia   . Hypertension   . Osteopenia     SURGICAL HISTORY: Past Surgical History:  Procedure Laterality Date  . ABDOMINAL HYSTERECTOMY Bilateral 1999   w/b/l salpingo-oopherectomy  . APPENDECTOMY    . BREAST LUMPECTOMY WITH NEEDLE LOCALIZATION AND AXILLARY SENTINEL LYMPH NODE BX Left 09/12/2012   Procedure: LEFT NEEDLE LOCALIZATION BREAST LUMPECTOMY TION AND AXILLARY SENTINEL LYMPH NODE BX;  Surgeon: Edward Jolly, MD;  Location: [place];  Service: General;  Laterality: Left;  . BREAST LUMPECTOMY WITH RADIOACTIVE SEED AND SENTINEL LYMPH NODE BIOPSY Right 10/26/2017   Procedure: BREAST LUMPECTOMY WITH RADIOACTIVE SEED AND SENTINEL LYMPH NODE BIOPSY;  Surgeon: Excell Seltzer, MD;  Location: [place];  Service: General;  Laterality: Right;  . BREAST SURGERY  1990's   right- fibroid cyst  . CHOLECYSTECTOMY    . SEPTOPLASTY    . TONSILLECTOMY      I have reviewed the social history and family history with the patient and they are unchanged from previous note.  ALLERGIES:  is allergic to penicillins; other; prednisone; radiaplexrx [woun'dres hydrogel wound dress]; and sulfamethoxazole.  MEDICATIONS:  Current Outpatient Medications  Medication Sig Dispense Refill  . ACCU-CHEK AVIVA PLUS test strip USE TO TEST TWICE DAILY **ICD-10 CODE E13.9** 200 each 3  . allopurinol (ZYLOPRIM) 300 MG tablet TAKE 1 TABLET (300 MG TOTAL) BY MOUTH DAILY. **DNF 04/06/15** 90 tablet 3  . atenolol (TENORMIN) 25 MG tablet TAKE 1 TABLET BY MOUTH EVERY DAY 90 tablet 2  . chlorpheniramine (CHLOR-TRIMETON) 4 MG tablet Take 4 mg 2 (two) times daily as needed by mouth  for allergies.    . clindamycin (CLEOCIN) 150 MG capsule Take 1 capsule (150 mg total) by mouth every 6 (six) hours. 28 capsule 0  . diltiazem (CARDIZEM CD) 360 MG 24 hr capsule TAKE 1 CAPSULE (360 MG TOTAL) BY MOUTH DAILY. 90 capsule 3  . ELIQUIS 5 MG TABS tablet TAKE 1 TABLET BY MOUTH TWICE A DAY 180 tablet 1  . fish oil-omega-3 fatty acids 1000 MG capsule Take 1 g by mouth daily.    Marland Kitchen lidocaine-prilocaine (EMLA) cream Apply 1 application topically as needed. 30 g 3  . loperamide (IMODIUM) 2 MG capsule Take 2 mg by mouth as needed for diarrhea or loose stools.    Marland Kitchen  magic mouthwash w/lidocaine SOLN Take 5 mLs by mouth 3 (three) times daily. Swish and spit (Patient not taking: Reported on 02/11/2018) 240 mL 0  . metFORMIN (GLUCOPHAGE) 500 MG tablet Take 1 tablet (500 mg total) by mouth 2 (two) times daily. 180 tablet 3  . Multiple Vitamin (MULTIVITAMIN) tablet Take 1 tablet by mouth daily.    Marland Kitchen omega-3 acid ethyl esters (LOVAZA) 1 g capsule Take 1 capsule by mouth daily.    . ondansetron (ZOFRAN) 8 MG tablet Take 1 tablet (8 mg total) by mouth 2 (two) times daily as needed (Nausea or vomiting). (Patient not taking: Reported on 02/11/2018) 30 tablet 1  . OVER THE COUNTER MEDICATION Take by mouth 2 (two) times daily. Presavision- for macular degeneration prevention    . prochlorperazine (COMPAZINE) 10 MG tablet Take 1 tablet (10 mg total) by mouth every 6 (six) hours as needed (Nausea or vomiting). (Patient not taking: Reported on 02/11/2018) 30 tablet 1  . triamcinolone lotion (KENALOG) 0.1 % Apply 1 application topically 3 (three) times daily. (Patient not taking: Reported on 02/11/2018) 60 mL 1  . VITAMIN D, ERGOCALCIFEROL, PO Take by mouth.     No current facility-administered medications for this visit.    Facility-Administered Medications Ordered in Other Visits  Medication Dose Route Frequency Provider Last Rate Last Dose  . heparin lock flush 100 unit/mL  500 Units Intracatheter Once PRN  Truitt Merle, MD      . PACLitaxel-protein bound (ABRAXANE) chemo infusion 125 mg  70 mg/m2 (Treatment Plan Recorded) Intravenous Once Truitt Merle, MD      . sodium chloride flush (NS) 0.9 % injection 10 mL  10 mL Intracatheter PRN Truitt Merle, MD        PHYSICAL EXAMINATION: ECOG PERFORMANCE STATUS: 1 - Symptomatic but completely ambulatory  Vitals:   02/18/18 1023  BP: 138/75  Pulse: (!) 106  Resp: 17  Temp: 97.6 F (36.4 C)  SpO2: 100%   Filed Weights   02/18/18 1023  Weight: 134 lb 14.4 oz (61.2 kg)    GENERAL:alert, no distress and comfortable SKIN: no rashes or significant lesions EYES:  sclera clear OROPHARYNX:no thrush or ulcers  LYMPH:  no palpable cervical or supraclavicular lymphadenopathy LUNGS: clear to auscultation with normal breathing effort HEART: Afib. No lower extremity edema ABDOMEN:abdomen soft, non-tender and normal bowel sounds Musculoskeletal:no cyanosis of digits and no clubbing  NEURO: alert & oriented x 3 with fluent speech, no focal motor/sensory deficits PAC without erythema  Breast exam deferred   LABORATORY DATA:  I have reviewed the data as listed CBC Latest Ref Rng & Units 02/18/2018 02/14/2018 02/11/2018  WBC 4.0 - 10.5 K/uL 3.2(L) 3.2(L) 4.4  Hemoglobin 12.0 - 15.0 g/dL 8.5(L) 8.9(L) 8.7(L)  Hematocrit 36.0 - 46.0 % 26.3(L) 27.6(L) 26.8(L)  Platelets 150 - 400 K/uL 234 210 230     CMP Latest Ref Rng & Units 02/18/2018 02/14/2018 02/11/2018  Glucose 70 - 99 mg/dL 162(H) 164(H) 155(H)  BUN 8 - 23 mg/dL '17 18 19  '$ Creatinine 0.44 - 1.00 mg/dL 1.00 0.90 1.16(H)  Sodium 135 - 145 mmol/L 142 141 138  Potassium 3.5 - 5.1 mmol/L 3.6 4.2 3.2(L)  Chloride 98 - 111 mmol/L 110 108 106  CO2 22 - 32 mmol/L '22 23 22  '$ Calcium 8.9 - 10.3 mg/dL 9.3 9.1 8.9  Total Protein 6.5 - 8.1 g/dL 6.3(L) 6.3(L) 6.3(L)  Total Bilirubin 0.3 - 1.2 mg/dL 0.5 0.8 0.4  Alkaline Phos 38 - 126 U/L  92 77 91  AST 15 - 41 U/L '17 25 21  '$ ALT 0 - 44 U/L '17 23 23       '$ RADIOGRAPHIC STUDIES: I have personally reviewed the radiological images as listed and agreed with the findings in the report. No results found.   ASSESSMENT & PLAN: 83[place], [place] woman   1. Breast cancer of upper outer quadrant of right breast, invasive ductal carcinoma, stage IB, pT1bN0M0, grade 2, Triple Negative, Ki67: 80% 2. pT1bN0, stage IA invasive ductal carcinoma of the left breast, grade I, ER 100%, PR 88%, Ki-67 16%, HER-2/neu negative(2014); s/p lympectomy with SLNB, adjuvant radiation, and adjuvant AI with exemestane then letrozole completed 11/2017  3. Osteopenia, on vitamin D;DEXA scan on 09/20/2017 showed: Osteopenia, very high risk of fracture, 29% for major osteoporotic fracture and 18% for hip fracture 4. Hypercalcemia 5. DM 6. Social support 7. Chemotherapy induced neutropenia, stable with dose reduction to abraxane 70 mg/m2 8. Skin rash,suspected secondary to abraxane 9. Mucositis, grade 2, secondary to chemotherapy, resolved  10. Epistaxis, on eliquis; s/p cauterization and packing on 10/5   Ms. Glasser appears stable. She completed 10th weekly dose of abraxane. She continues to tolerate treatment well overall, with mild fatigue. She has no recurrent epistaxis or other bleeding. Her anemia remains stable, Hgb 8.5 today. Will hold blood transfusion, as she is not acutely symptomatic. Labs otherwise adequate to proceed with cycle 4 day 8 (11th dose) today. She will return for f/u in 1 week for last chemo. She will then proceed to adjuvant radiation. She is scheduled to see Dr. Isidore Moos on 11/12.  PLAN: -Labs reviewed, proceed with cycle 4 day 8 (11th weekly dose) abraxane today at current dose -F/u in 1 week with last chemo -Proceed to adjuvant radiation, f/u with Dr. Isidore Moos 11/12  All questions were answered. The patient knows to call the clinic with any problems, questions or concerns. No barriers to learning was detected.     Adrienne Feeling,  NP 02/18/18

## 2018-02-18 ENCOUNTER — Inpatient Hospital Stay: Payer: Medicare Other

## 2018-02-18 ENCOUNTER — Encounter: Payer: Self-pay | Admitting: Nurse Practitioner

## 2018-02-18 ENCOUNTER — Inpatient Hospital Stay: Payer: Medicare Other | Admitting: Nurse Practitioner

## 2018-02-18 VITALS — HR 93

## 2018-02-18 VITALS — BP 138/75 | HR 106 | Temp 97.6°F | Resp 17 | Ht 61.0 in | Wt 134.9 lb

## 2018-02-18 DIAGNOSIS — C50411 Malignant neoplasm of upper-outer quadrant of right female breast: Secondary | ICD-10-CM | POA: Diagnosis not present

## 2018-02-18 DIAGNOSIS — D649 Anemia, unspecified: Secondary | ICD-10-CM | POA: Diagnosis not present

## 2018-02-18 DIAGNOSIS — Z171 Estrogen receptor negative status [ER-]: Secondary | ICD-10-CM

## 2018-02-18 DIAGNOSIS — Z170 Estrogen receptor positive status [ER+]: Principal | ICD-10-CM

## 2018-02-18 DIAGNOSIS — R21 Rash and other nonspecific skin eruption: Secondary | ICD-10-CM

## 2018-02-18 DIAGNOSIS — D701 Agranulocytosis secondary to cancer chemotherapy: Secondary | ICD-10-CM

## 2018-02-18 DIAGNOSIS — R5383 Other fatigue: Secondary | ICD-10-CM

## 2018-02-18 DIAGNOSIS — M8580 Other specified disorders of bone density and structure, unspecified site: Secondary | ICD-10-CM

## 2018-02-18 DIAGNOSIS — E119 Type 2 diabetes mellitus without complications: Secondary | ICD-10-CM

## 2018-02-18 DIAGNOSIS — C50412 Malignant neoplasm of upper-outer quadrant of left female breast: Secondary | ICD-10-CM

## 2018-02-18 DIAGNOSIS — Z95828 Presence of other vascular implants and grafts: Secondary | ICD-10-CM

## 2018-02-18 DIAGNOSIS — Z5111 Encounter for antineoplastic chemotherapy: Secondary | ICD-10-CM | POA: Diagnosis not present

## 2018-02-18 LAB — CBC WITH DIFFERENTIAL (CANCER CENTER ONLY)
Abs Immature Granulocytes: 0.24 10*3/uL — ABNORMAL HIGH (ref 0.00–0.07)
Basophils Absolute: 0.1 10*3/uL (ref 0.0–0.1)
Basophils Relative: 2 %
EOS ABS: 0.1 10*3/uL (ref 0.0–0.5)
EOS PCT: 2 %
HEMATOCRIT: 26.3 % — AB (ref 36.0–46.0)
Hemoglobin: 8.5 g/dL — ABNORMAL LOW (ref 12.0–15.0)
Immature Granulocytes: 7 %
LYMPHS ABS: 1.1 10*3/uL (ref 0.7–4.0)
Lymphocytes Relative: 34 %
MCH: 32.9 pg (ref 26.0–34.0)
MCHC: 32.3 g/dL (ref 30.0–36.0)
MCV: 101.9 fL — AB (ref 80.0–100.0)
MONO ABS: 0.3 10*3/uL (ref 0.1–1.0)
MONOS PCT: 9 %
Neutro Abs: 1.5 10*3/uL — ABNORMAL LOW (ref 1.7–7.7)
Neutrophils Relative %: 46 %
Platelet Count: 234 10*3/uL (ref 150–400)
RBC: 2.58 MIL/uL — ABNORMAL LOW (ref 3.87–5.11)
RDW: 18.4 % — AB (ref 11.5–15.5)
WBC Count: 3.2 10*3/uL — ABNORMAL LOW (ref 4.0–10.5)
nRBC: 1.9 % — ABNORMAL HIGH (ref 0.0–0.2)

## 2018-02-18 LAB — CMP (CANCER CENTER ONLY)
ALK PHOS: 92 U/L (ref 38–126)
ALT: 17 U/L (ref 0–44)
AST: 17 U/L (ref 15–41)
Albumin: 3.5 g/dL (ref 3.5–5.0)
Anion gap: 10 (ref 5–15)
BUN: 17 mg/dL (ref 8–23)
CO2: 22 mmol/L (ref 22–32)
CREATININE: 1 mg/dL (ref 0.44–1.00)
Calcium: 9.3 mg/dL (ref 8.9–10.3)
Chloride: 110 mmol/L (ref 98–111)
GFR, Est AFR Am: 60 mL/min — ABNORMAL LOW (ref >60)
GFR, Estimated: 51 mL/min — ABNORMAL LOW (ref >60)
GLUCOSE: 162 mg/dL — AB (ref 70–99)
Potassium: 3.6 mmol/L (ref 3.5–5.1)
SODIUM: 142 mmol/L (ref 135–145)
Total Bilirubin: 0.5 mg/dL (ref 0.3–1.2)
Total Protein: 6.3 g/dL — ABNORMAL LOW (ref 6.5–8.1)

## 2018-02-18 MED ORDER — SODIUM CHLORIDE 0.9% FLUSH
10.0000 mL | INTRAVENOUS | Status: DC | PRN
  Administered 2018-02-18: 10 mL
  Filled 2018-02-18: qty 10

## 2018-02-18 MED ORDER — PACLITAXEL PROTEIN-BOUND CHEMO INJECTION 100 MG
70.0000 mg/m2 | Freq: Once | INTRAVENOUS | Status: AC
  Administered 2018-02-18: 125 mg via INTRAVENOUS
  Filled 2018-02-18: qty 25

## 2018-02-18 MED ORDER — HEPARIN SOD (PORK) LOCK FLUSH 100 UNIT/ML IV SOLN
500.0000 [IU] | Freq: Once | INTRAVENOUS | Status: AC | PRN
  Administered 2018-02-18: 500 [IU]
  Filled 2018-02-18: qty 5

## 2018-02-18 MED ORDER — SODIUM CHLORIDE 0.9 % IV SOLN
Freq: Once | INTRAVENOUS | Status: AC
  Administered 2018-02-18: 12:00:00 via INTRAVENOUS
  Filled 2018-02-18: qty 250

## 2018-02-18 MED ORDER — SODIUM CHLORIDE 0.9% FLUSH
10.0000 mL | Freq: Once | INTRAVENOUS | Status: AC
  Administered 2018-02-18: 10 mL
  Filled 2018-02-18: qty 10

## 2018-02-18 MED ORDER — PROCHLORPERAZINE MALEATE 10 MG PO TABS
10.0000 mg | ORAL_TABLET | Freq: Once | ORAL | Status: AC
  Administered 2018-02-18: 10 mg via ORAL

## 2018-02-18 MED ORDER — PROCHLORPERAZINE MALEATE 10 MG PO TABS
ORAL_TABLET | ORAL | Status: AC
  Filled 2018-02-18: qty 1

## 2018-02-18 NOTE — Patient Instructions (Signed)
[facility] Discharge Instructions for Patients Receiving Chemotherapy  Today you received the following chemotherapy agents:  Abraxane.  To help prevent nausea and vomiting after your treatment, we encourage you to take your nausea medication as directed.   If you develop nausea and vomiting that is not controlled by your nausea medication, call the clinic.   BELOW ARE SYMPTOMS THAT SHOULD BE REPORTED IMMEDIATELY:  *FEVER GREATER THAN 100.5 F  *CHILLS WITH OR WITHOUT FEVER  NAUSEA AND VOMITING THAT IS NOT CONTROLLED WITH YOUR NAUSEA MEDICATION  *UNUSUAL SHORTNESS OF BREATH  *UNUSUAL BRUISING OR BLEEDING  TENDERNESS IN MOUTH AND THROAT WITH OR WITHOUT PRESENCE OF ULCERS  *URINARY PROBLEMS  *BOWEL PROBLEMS  UNUSUAL RASH Items with * indicate a potential emergency and should be followed up as soon as possible.  Feel free to call the clinic should you have any questions or concerns. The clinic phone number is [phone].  Please show the CHEMO ALERT CARD at check-in to the Emergency Department and triage nurse.   

## 2018-02-21 ENCOUNTER — Telehealth: Payer: Self-pay | Admitting: Hematology

## 2018-02-21 NOTE — Progress Notes (Signed)
[facility] Follow-up office visit  Date of Service:  02/25/2018   ID: Adrienne [name] OB: [birth year]  MR#: [id number]  VEH#:[id number]  PCP: [name], NP      CHIEF COMPLAINT: Follow up for right breast cancer, triple negative   Oncology History   Cancer Staging Breast cancer of upper-outer quadrant of right female breast [facility]) Staging form: Breast, AJCC 8th Edition - Clinical stage from 09/22/2017: Stage IB (cT1b, cN0, cM0, G2, ER-, PR-, HER2-) - Signed by Truitt Merle, MD on 10/08/2017  Breast cancer, left breast [facility]) Staging form: Breast, AJCC 7th Edition - Clinical stage from 09/12/2012: Stage IA (T1b, N0, M0) - Unsigned        Breast cancer, left breast ([place])   08/25/2012 Receptors her2    ER 100% positive, PR 88% positive, HER-2 negative, Ki-67 16%    08/30/2012 Initial Diagnosis    Breast cancer, left breast    09/12/2012 Pathology Results    T1bN0 Grade 1 invasive ductal carcinoma, and DCIS.    09/12/2012 Surgery    Left breast lumpectomy and sentinel lymph node biopsy, negative margins.    11/09/2012 - 12/13/2012 Radiation Therapy    Adjuvant breast radiation    12/2012 - 11/2017 Anti-estrogen oral therapy    Anastrozole 1 mg daily, switched to Aromasin 25 mg daily in Jan 2015 due to diarrhea. Her Exemestane was switched to letrozole in 08/2017 due to high copay. She completed her 5 years in 11/2017.     09/01/2016 Imaging    [name] Outside films Breast form [facility] 09/01/2016 IMPRESSION: Probably Benign 1. No significant change in oval hypoechoic masses in the left breast at 11:00 2 cm from the nipple and 10:30 4 cm from the nipple. Both of these masses resemble the area of fat necrosis previously biopsies at 2:00. In addition, both masses have developing central benign or dystrophic appearing calcifications and are favored to be other areas of fat necrosis. A 6 month follow-up left breast mammogram and ultrasound is recommended.    09/01/2016 Mammogram    HM  Mammogram from [facility] 09/01/16 IMPRESSION  Incomplete - additional imaging evaluation needed Ultrasound of the upper medial left breast mass is recommended.     03/09/2017 Mammogram    Previous lumpectomy changes in the upper outer left breast anterior depth with stable associated dystrophic calcifications.  Biopsy clip at the 2:00 in the left breast near the lumpectomy site corresponding to previously biopsied benign fat necrosis.  Persistent round mass in the upper medial left breast with benign-appearing central round calcification.  No significant masses, calcifications, or other findings are seen in the breast  Impression: ultrasound is recommended for the left breast    03/09/2017 Breast US    Left breast ultrasound impression:  No significant change in oval hypoechoic masses in the left breast at 11:00 2 cm from the nipple and 10:30 4 cm from the nipple.  Both of these masses resemble the area of fat necrosis previously biopsied at 2:00.  In addition, both masses have developing central benign or dystrophic appearing calcifications and are favored to be other areas of fat necrosis.  A 6-monthfollow-up bilateral mammogram and left breast ultrasound is recommended.    09/14/2017 Breast [place]   Breast [place] 09/14/17 at [facility]  IMPRESSION:  The 1 cm oval mass in the left breast at 9:30 posterior depth is highly suggestive of malignancy. An Ultrasound guided biopsy is recommended.  The 0.8 cm round mass in  the left breast at 11-12 o'clock anterior depth is suspicious of malignancy. An ultrasound guided biopsy is recommended.     09/22/2017 Pathology Results    Diagnosis 1. Breast, right, needle core biopsy - INVASIVE DUCTAL CARCINOMA, MSBR GRADE II/III. - DUCTAL CARCINOMA IN SITU WITH NECROSIS. 2. Breast, left, needle core biopsy - FAT NECROSIS. - NO EVIDENCE OF MALIGNANCY.    10/17/2017 Genetic Testing    Negative genetic testing on the Multicancer panel.  The Multi-Gene Panel  offered by [facility] includes sequencing and/or deletion duplication testing of the following 83 genes: ALK, APC, ATM, AXIN2,BAP1,  BARD1, BLM, BMPR1A, BRCA1, BRCA2, BRIP1, CASR, CDC73, CDH1, CDK4, CDKN1B, CDKN1C, CDKN2A (p14ARF), CDKN2A (p16INK4a), CEBPA, CHEK2, CTNNA1, DICER1, DIS3L2, EGFR (c.2369C>T, p.Thr790Met variant only), EPCAM (Deletion/duplication testing only), FH, FLCN, GATA2, GPC3, GREM1 (Promoter region deletion/duplication testing only), HOXB13 (c.251G>A, p.Gly84Glu), HRAS, KIT, MAX, MEN1, MET, MITF (c.952G>A, p.Glu318Lys variant only), MLH1, MSH2, MSH3, MSH6, MUTYH, NBN, NF1, NF2, NTHL1, PALB2, PDGFRA, PHOX2B, PMS2, POLD1, POLE, POT1, PRKAR1A, PTCH1, PTEN, RAD50, RAD51C, RAD51D, RB1, RECQL4, RET, RUNX1, SDHAF2, SDHA (sequence changes only), SDHB, SDHC, SDHD, SMAD4, SMARCA4, SMARCB1, SMARCE1, STK11, SUFU, TERT, TERT, TMEM127, TP53, TSC1, TSC2, VHL, WRN and WT1.  The report date is October 17, 2017.     Breast cancer of upper-outer quadrant of right female breast (HCC)   09/14/2017 Mammogram    Diagnostic mammogram at [facility]  IMPRESSION:  The new 0.9 cm oval high density mass in the right breast posterior depth superior region seen on the mediolateral oblique view only is indeterminate. The 1.1 cm focal asymmetry in the left breast central to the nipple anterior depth is indeterminate.     09/14/2017 Imaging    US Bilateral at [facility] IMPRESSION: The 1 cm oval mass in the right breast at 9:30 posterior depth is highly suggestive of malignancy. The 0.8 cm round mass in the left breast at 11-12 o'clock anterior depth is suspicious of malignancy.     09/22/2017 Cancer Staging    Staging form: Breast, AJCC 8th Edition - Clinical stage from 09/22/2017: Stage IB (cT1b, cN0, cM0, G2, ER-, PR-, HER2-) - Signed by Burnard Enis, MD on 10/08/2017    09/22/2017 Pathology Results    Bilateral needle core biopsy 1. Breast, right, needle core biopsy - INVASIVE DUCTAL CARCINOMA, MSBR GRADE II/III. - DUCTAL  CARCINOMA IN SITU WITH NECROSIS. 2. Breast, left, needle core biopsy - FAT NECROSIS. - NO EVIDENCE OF MALIGNANCY.    09/25/2017 Receptors her2    Estrogen Receptor: 0 Progesterone 0 HER2: Negative. Ki-67: 80%     10/08/2017 Initial Diagnosis    Breast cancer of upper-outer quadrant of right female breast (HCC)    10/26/2017 Surgery    BREAST LUMPECTOMY WITH RADIOACTIVE SEED AND SENTINEL LYMPH NODE BIOPSY by Dr. Hoxworth  10/26/17    10/26/2017 Pathology Results    Diagnosis 10/26/17 1. Breast, lumpectomy, Right - INVASIVE DUCTAL CARCINOMA, GRADE 2, SPANNING 1 CM. - HIGH GRADE DUCTAL CARCINOMA IN SITU WITH NECROSIS. - FINAL RESECTION MARGINS (PARTS #2, 3, 4) ARE NEGATIVE. - BIOPSY SITE. - SEE ONCOLOGY TABLE. 2. Breast, excision, additional anterior margin- 2 pieces right - BENIGN BREAST TISSUE. 3. Breast, excision, Right unoriented anterior margin - BENIGN BREAST TISSUE. 4. Breast, excision, Right deep margin - BENIGN BREAST TISSUE. 5. Lymph node, sentinel, biopsy, Right Axillary - ONE OF ONE LYMPH NODES NEGATIVE FOR CARCINOMA (0/1).    10/26/2017 Cancer Staging    Staging form: Breast, AJCC 8th Edition - Pathologic stage   from 10/26/2017: Stage IB (pT1b, pN0, cM0, G2, ER-, PR-, HER2-) - Signed by Truitt Merle, MD on 11/12/2017    12/03/2017 -  Chemotherapy    Weekly Abraxane for 12 weeks 12/03/17-02/25/18      CURRENT THERAPY:   -Adjuvant weekly Abraxane for 12 weeks 12/03/17-02/25/18   INTERVAL HISTORY:  Adrienne [name] returns for follow-up for right breast cancer and last cycle Abraxane. She presents to the clinic today by herself. She notes she plans to go to the beach tomorrow for a week. She is looking forward to her last cycle Abraxane today.  She notes her fingers are starting to tingle 4 days ago so she plans to use ice bags on her hands today. She has no dexterity change from this. She notes she is fatigued overall, but denies nausea or pain. She notes after dropping her Kindle on  her big toe, the nail has turned black. She notes she has not had epistaxis lately.      PAST MEDICAL HISTORY: Past Medical History:  Diagnosis Date  . Allergy   . Atrial fibrillation ([place])   . Breast cancer ([place]) 08/25/12   Invasive ductal ca,DCIS  . Diabetes mellitus    type II  . Family history of cancer   . Gout   . Hearing loss   . History of breast cancer   . History of frequent urinary tract infections   . Hx of radiation therapy 11/22/12- 12/19/12   breast 4250 cGy 17 sessions, left breast boost 750 cGy 3 sessions  . Hyperlipidemia   . Hypertension   . Osteopenia     PAST SURGICAL HISTORY: Past Surgical History:  Procedure Laterality Date  . ABDOMINAL HYSTERECTOMY Bilateral 1999   w/b/l salpingo-oopherectomy  . APPENDECTOMY    . BREAST LUMPECTOMY WITH NEEDLE LOCALIZATION AND AXILLARY SENTINEL LYMPH NODE BX Left 09/12/2012   Procedure: LEFT NEEDLE LOCALIZATION BREAST LUMPECTOMY TION AND AXILLARY SENTINEL LYMPH NODE BX;  Surgeon: Edward Jolly, MD;  Location: [facility];  Service: General;  Laterality: Left;  . BREAST LUMPECTOMY WITH RADIOACTIVE SEED AND SENTINEL LYMPH NODE BIOPSY Right 10/26/2017   Procedure: BREAST LUMPECTOMY WITH RADIOACTIVE SEED AND SENTINEL LYMPH NODE BIOPSY;  Surgeon: Excell Seltzer, MD;  Location: [facility];  Service: General;  Laterality: Right;  . BREAST SURGERY  1990's   right- fibroid cyst  . CHOLECYSTECTOMY    . SEPTOPLASTY    . TONSILLECTOMY      FAMILY HISTORY Family History  Problem Relation Age of Onset  . Hypertension Mother   . Heart disease Mother        Murmur and irregular heart disease  . Dementia Mother        d. 69  . Hypertension Father   . Aneurysm Father 85  . Breast cancer Cousin 74       mat first cousin    GYNECOLOGIC HISTORY: menarche at age 8, g14 p3, s/p TAH/BSO in early 10s.  Was on HRT for several years (cannot recall the duration).  No h/o abnormal pap smears or sexually transmitted  infections.    SOCIAL HISTORY: Lives in [place], [place] in a three story house, will move to [facility] soon.  Her husband lives at a nursing home due to dementia.  She is a retired Pharmacist, hospital.  Her son and daughter live in [place].      ADVANCED DIRECTIVES: Not in place.    HEALTH MAINTENANCE: Social History   Tobacco Use  . Smoking status: Former  Smoker    Packs/day: 0.25    Years: 4.00    Pack years: 1.00    Types: Cigarettes  . Smokeless tobacco: Never Used  . Tobacco comment: Cigarette use was 50 years ago  Substance Use Topics  . Alcohol use: Yes    Comment: Occ glass of wine with dinner  . Drug use: No    Mammogram: 09/14/2017  Colonoscopy: 2011 Bone Density Scan: 03/05/2015 osteopenia  Pap Smear: s/p tah/bso Eye Exam: due Vitamin D Level:  Normal, 04/14/13 Lipid Panel: unsure   Allergies  Allergen Reactions  . Penicillins Hives    Has patient had a PCN reaction causing immediate rash, facial/tongue/throat swelling, SOB or lightheadedness with hypotension: No Has patient had a PCN reaction causing severe rash involving mucus membranes or skin necrosis: No Has patient had a PCN reaction that required hospitalization: No Has patient had a PCN reaction occurring within the last 10 years: No If all of the above answers are "NO", then may proceed with Cephalosporin use.  . Other Hives  . Prednisone Other (See Comments)    ELEVATED BLOOD SUGAR  . Radiaplexrx [Woun'Dres Hydrogel Wound Dress] Hives  . Sulfamethoxazole Nausea Only    Current Outpatient Medications  Medication Sig Dispense Refill  . ACCU-CHEK AVIVA PLUS test strip USE TO TEST TWICE DAILY **ICD-10 CODE E13.9** 200 each 3  . allopurinol (ZYLOPRIM) 300 MG tablet TAKE 1 TABLET (300 MG TOTAL) BY MOUTH DAILY. **DNF 04/06/15** 90 tablet 3  . atenolol (TENORMIN) 25 MG tablet TAKE 1 TABLET BY MOUTH EVERY DAY 90 tablet 2  . chlorpheniramine (CHLOR-TRIMETON) 4 MG tablet Take 4 mg 2 (two) times daily as needed  by mouth for allergies.    . clindamycin (CLEOCIN) 150 MG capsule Take 1 capsule (150 mg total) by mouth every 6 (six) hours. 28 capsule 0  . diltiazem (CARDIZEM CD) 360 MG 24 hr capsule TAKE 1 CAPSULE (360 MG TOTAL) BY MOUTH DAILY. 90 capsule 3  . ELIQUIS 5 MG TABS tablet TAKE 1 TABLET BY MOUTH TWICE A DAY 180 tablet 1  . fish oil-omega-3 fatty acids 1000 MG capsule Take 1 g by mouth daily.    Marland Kitchen lidocaine-prilocaine (EMLA) cream Apply 1 application topically as needed. 30 g 3  . loperamide (IMODIUM) 2 MG capsule Take 2 mg by mouth as needed for diarrhea or loose stools.    . magic mouthwash w/lidocaine SOLN Take 5 mLs by mouth 3 (three) times daily. Swish and spit (Patient not taking: Reported on 02/11/2018) 240 mL 0  . metFORMIN (GLUCOPHAGE) 500 MG tablet Take 1 tablet (500 mg total) by mouth 2 (two) times daily. 180 tablet 3  . Multiple Vitamin (MULTIVITAMIN) tablet Take 1 tablet by mouth daily.    Marland Kitchen omega-3 acid ethyl esters (LOVAZA) 1 g capsule Take 1 capsule by mouth daily.    . ondansetron (ZOFRAN) 8 MG tablet Take 1 tablet (8 mg total) by mouth 2 (two) times daily as needed (Nausea or vomiting). (Patient not taking: Reported on 02/11/2018) 30 tablet 1  . OVER THE COUNTER MEDICATION Take by mouth 2 (two) times daily. Presavision- for macular degeneration prevention    . prochlorperazine (COMPAZINE) 10 MG tablet Take 1 tablet (10 mg total) by mouth every 6 (six) hours as needed (Nausea or vomiting). (Patient not taking: Reported on 02/11/2018) 30 tablet 1  . triamcinolone lotion (KENALOG) 0.1 % Apply 1 application topically 3 (three) times daily. (Patient not taking: Reported on 02/11/2018) 60 mL 1  .  VITAMIN D, ERGOCALCIFEROL, PO Take by mouth.     No current facility-administered medications for this visit.    Facility-Administered Medications Ordered in Other Visits  Medication Dose Route Frequency Provider Last Rate Last Dose  . heparin lock flush 100 unit/mL  500 Units Intracatheter  Once PRN Truitt Merle, MD      . PACLitaxel-protein bound (ABRAXANE) chemo infusion 125 mg  70 mg/m2 (Treatment Plan Recorded) Intravenous Once Truitt Merle, MD      . sodium chloride flush (NS) 0.9 % injection 10 mL  10 mL Intracatheter PRN Truitt Merle, MD       REVIEW OF SYSTEMS:   Constitutional: Denies fevers, chills or abnormal night sweats (+) Fatigue Eyes: Denies blurriness of vision, double vision or watery eyes Ears, nose, mouth, throat, and face: Denies mucositis or sore throat Respiratory: Denies cough, dyspnea or wheezes Cardiovascular: Denies palpitation, chest discomfort or lower extremity swelling Gastrointestinal:  Denies nausea, heartburn or change in bowel habits  Skin: Denies abnormal skin rashes (+) moderate diffuse chemo rash on b/l forearms, will itching (+) 1 Black toenails after hitting her foot  Lymphatics: Denies new lymphadenopathy or easy bruising Neurological:Denies numbness, tingling or new weaknesses Behavioral/Psych: Mood is stable, no new changes  All other systems were reviewed with the patient and are negative.  OBJECTIVE:   Vitals:   02/25/18 1042  BP: (!) 134/93  Pulse: (!) 114  Resp: 17  Temp: (!) 97.4 F (36.3 C)     Body mass index is 25.09 kg/m.     GENERAL: Patient is a well appearing female in no acute distress HEENT:  Sclerae anicteric.  Oropharynx clear and moist. No ulcerations or evidence of oropharyngeal candidiasis. Neck is supple.  NODES:  No cervical, supraclavicular, or axillary lymphadenopathy palpated.  LUNGS:  Clear to auscultation bilaterally.  No wheezes or rhonchi. HEART:  Regular rate and rhythm. No murmur appreciated. ABDOMEN:  Soft, nontender.  Positive, normoactive bowel sounds. No organomegaly palpated. MSK:  No focal spinal tenderness to palpation. Full range of motion bilaterally in the upper extremities. EXTREMITIES:  No peripheral edema.   SKIN:  Clear with no obvious rashes or skin changes. No nail  dyscrasia. (+) bruises  from blood withdrawal  (+) diffuse chemo rash on b/l forearms, with mild skin erythema  NEURO:  Nonfocal. Well oriented.  Appropriate affect. Breasts: Breast inspection showed them to be symmetrical with no nipple discharge. (+) S/p Right Breast Lumpectomy: Surgical incision in right breast and axilla healing very well, no discharge. Palpation of the breasts and axilla revealed no obvious mass that I could appreciate.    ECOG FS:1 - Symptomatic but completely ambulatory  LAB RESULTS: CBC Latest Ref Rng & Units 02/25/2018 02/18/2018 02/14/2018  WBC 4.0 - 10.5 K/uL 4.4 3.2(L) 3.2(L)  Hemoglobin 12.0 - 15.0 g/dL 9.2(L) 8.5(L) 8.9(L)  Hematocrit 36.0 - 46.0 % 28.7(L) 26.3(L) 27.6(L)  Platelets 150 - 400 K/uL 233 234 210    CMP Latest Ref Rng & Units 02/25/2018 02/18/2018 02/14/2018  Glucose 70 - 99 mg/dL 118(H) 162(H) 164(H)  BUN 8 - 23 mg/dL '16 17 18  '$ Creatinine 0.44 - 1.00 mg/dL 0.98 1.00 0.90  Sodium 135 - 145 mmol/L 142 142 141  Potassium 3.5 - 5.1 mmol/L 3.7 3.6 4.2  Chloride 98 - 111 mmol/L 107 110 108  CO2 22 - 32 mmol/L '23 22 23  '$ Calcium 8.9 - 10.3 mg/dL 9.1 9.3 9.1  Total Protein 6.5 - 8.1 g/dL 6.5 6.3(L) 6.3(L)  Total Bilirubin 0.3 - 1.2 mg/dL 0.4 0.5 0.8  Alkaline Phos 38 - 126 U/L 96 92 77  AST 15 - 41 U/L '21 17 25  '$ ALT 0 - 44 U/L '23 17 23    '$ PATHOLOGY REPORT  Diagnosis 10/26/17 1. Breast, lumpectomy, Right - INVASIVE DUCTAL CARCINOMA, GRADE 2, SPANNING 1 CM. - HIGH GRADE DUCTAL CARCINOMA IN SITU WITH NECROSIS. - FINAL RESECTION MARGINS (PARTS #2, 3, 4) ARE NEGATIVE. - BIOPSY SITE. - SEE ONCOLOGY TABLE. 2. Breast, excision, additional anterior margin- 2 pieces right - BENIGN BREAST TISSUE. 3. Breast, excision, Right unoriented anterior margin - BENIGN BREAST TISSUE. 4. Breast, excision, Right deep margin - BENIGN BREAST TISSUE. 5. Lymph node, sentinel, biopsy, Right Axillary - ONE OF ONE LYMPH NODES NEGATIVE FOR CARCINOMA (0/1). Microscopic Comment 1. INVASIVE  CARCINOMA OF THE BREAST: Resection Procedure: Right breast lumpectomy with additional anterior and posterior margin. Right axillary lymph node biopsy. Specimen Laterality: Right. Tumor Size: 1 cm. Histologic Type: Invasive ductal carcinoma. Histologic Grade: Glandular (Acinar)/Tubular Differentiation: 2 1 of 3 Amended copy Corrected FINAL for AIXA, CORSELLO ([id number]) Microscopic Comment(continued) Nuclear Pleomorphism: 2 Mitotic Rate: 2 Overall Grade: 2 Ductal Carcinoma In Situ: Present, high grade with necrosis. Tumor Extension: Confined to breast parenchyma. Margins: Distance from closest margin (millimeters): Originally <1 mm anterior, 1 mm posterior. Final anterior and posterior margins (parts 2, 3, 4) are negative. Specify closest margin (required only if <19m): Final margins >10 mm. DCIS Margins Distance from closest margin (millimeters): Original 2-3 mm anterior, 1 mm posterior. Final anterior and posterior margins (parts 2, 3, 4) are negative. Specify closest margin (required only if <169m: Final margins >10 mm Regional Lymph Nodes: Number of Lymph Nodes Examined: 1 Number of Sentinel Nodes Examined (if applicable): 1 Number of Lymph Nodes with Macrometastases (>2 mm): 0 Number of Lymph Nodes with Micrometastases: 0 Number of Lymph Nodes with Isolated Tumor Cells (0.2 mm or 200 cells)#: 0 Size of Largest Metastatic Deposit (millimeters): N/A. Extranodal Extension N/A. Treatment Effect: No known presurgical therapy Breast Biomarker Testing Performed on Previous Biopsy: Testing Performed on Case Number: [id number] Receptor: 0 Progesterone 0 HER2: Negative. Ki-67: 80% Representative tumor block: 1A-C. Pathologic Stage Classification (pTNM, AJCC 8th Edition): pT1b, pN0 (v4.2.0.0)   Diagnosis, 09/22/2017 1. Breast, right, needle core biopsy - INVASIVE DUCTAL CARCINOMA, MSBR GRADE II/III. - DUCTAL CARCINOMA IN SITU WITH NECROSIS. 2. Breast, left,  needle core biopsy - FAT NECROSIS. - NO EVIDENCE OF MALIGNANCY.   Diagnosis  08/28/2015 Breast, left, needle core biopsy - FAT NECROSIS. - THERE IS NO EVIDENCE OF MALIGNANCY. - SEE COMMENT.         STUDIES: I have requested her recent mammogram report from [facility] scan, 09/20/2017  Osteopenia, very high risk of fracture, 29% for major osteoporotic fracture and 18% for hip fracture.   Breast Diagnostic 09/14/17 at [facility]  IMPRESSION:  The new 0.9 cm oval high density mass in the right breast posterior depth superior region seen on the mediolateral oblique view only is indeterminate. The 1.1 cm focal asymmetry in the left breast central to the nipple anterior depth is indeterminate.   [place], 09/14/2017 at [facility] IMPRESSION: The 1 cm oval mass in the right breast at 9:30 posterior depth is highly suggestive of malignancy. The 0.8 cm round mass in the left breast at 11-12 o'clock anterior depth is suspicious of malignancy.    [facility] Breast 03/09/17  IMPRESSION: 1. No significant change in oval masses  in the left breast at the 11:00 cm from the nipple and 10:30 4 cm from the nipple.  Both of these masses resemble the area of fat necrosis previously biopsied at 2:00. In addition, both of these masses have developing central benign or dystrophic appearing calcifications and are favored to be other areas of fat necrosis.  A 6 month follow up bilateral mammogram and left breast US is recommended.  [name] Outside films Breast form [facility] 09/01/2016 IMPRESSION: Probably Benign 1. No significant change in oval hypoechoic masses in the left breast at 11:00 2 cm from the nipple and 10:30 4 cm from the nipple. Both of these masses resemble the area of fat necrosis previously biopsies at 2:00. In addition, both masses have developing central benign or dystrophic appearing calcifications and are favored to be other areas of fat necrosis. A 6 month follow-up left breast mammogram and  ultrasound is recommended.  HM Mammogram from [facility] 09/01/16 IMPRESSION  Incomplete - additional imaging evaluation needed Ultrasound of the upper medial left breast mass is recommended.   Left breast mammogram and ultrasound 02/27/2016 Impression: probably benign The 4 mm oval mass in the left breast 12:00 position likely represent fat necrosis, the 3 mm mass in the left breast upper inner quadrant middle depth most likely is fat necrosis, the 4 mm oval cyst in the left breast upper inner quadrant posterior depth is benign. Post surgical scar in the left breast upper outer quadrant anterior depth is benign.  ASSESSMENT:  83 y.o. [place], [place] woman   1. Breast cancer of upper outer quadrant of right breast, invasive ductal carcinoma, stage IB, pT1bN0M0, grade 2, Triple Negative, Ki67: 80% -We previously discussed that her newly diagnosed right breast is different from her previous left breast cancer. It is triple negative and more aggressive. We previously discussed moderate to high risk of recurrence, and the role of adjuvant chemo. Due to her advanced age, she is not a candidate for intensive chemotherapy. However she does have overall good health and performance status, she likely would be able to tolerate single agent Taxol or Abraxane if needed. -Her genetic testing was negative -She previously underwent right breast lumpectomy with SLNB by Dr. Excell Seltzer on 10/26/17. I discussed her pathology which shows a 1cm tumor, G2, triple negative tumor was completely resection with negative margin, node negative. I discussed this is more aggressive than her previous cancer.  -She has started weekly Abraxane on 12/03/17. She has tolerated moderately well with fatigue, mild skin rash, anemia and neutropenia. I will keep her Abraxane at '70mg'$ /m2 due to neutropenia. She also has intermittent diarrhea and nosebleeds lately along with fatigue. I encouraged her to take prophylactic imodium daily.  -Prior to last  cycle chemo she started to experience tingling in her hands, she will do cryotherapy during last infusion. I discussed this should improve over time after she completes chemo.  -Labs reviewed, Hg at 9.2, BG at 118, overall adequate to proceed with Last cycle Abraxane today.  -I discussed her option of removing port, she is interested to remove it before radiation. If not, I advised her to have it flushed every 4-6 weeks.  -She will proceed with Radiaiton with Dr. Isidore Moos next month  -We discussed breast cancer surveillance, will plan to see her every 3 to 4 months for the first year, then every 4-6 month, and continue annual mammogram  -She is fine to return to light exercise after treatment, but I encouraged her to take it slow.  -F/u  on 11/20    2. pT1bN0, stage IA invasive ductal carcinoma of the left breast, grade I, ER 100%, PR 88%, Ki-67 16%, HER-2/neu negative. -She previously underwent lumpectomy with sentinel lymph node biopsy on 09/12/2012, s/p radiation therapy from 11/09/2012 through 12/19/2012.  -Her left breast biopsy in 08/2015 showed fat necrosis. No clinical evidence of recurrence. -Her repeated mammogram in May 2018 showed 2 lesions in UIQ of left breast, stable, likely benign (fat necrosis per biopsy in 2017) -Left breast mammogram and ultrasound on 03/09/2017 reveal hypoechoic masses that have developed central benign or dystrophic appearing calcifications and are favored to be areas of fat necrosis. -She was on adjuvant Exemestane, tolerated well -09/16/17 US showed mass behind her left breast scar and a mass In her right breast, both highly suggestive of malignancy.  -Her Exemestane was switched to letrozole in 08/2017 due to high copay. She tolerated well and completed in 11/2017.   3. Osteopenia -She is on vitamin D supplement daily. -Given her worsening hypercalcemia, I told her to stop calcium  -Her bone density scan from 03/05/2015 showed osteopenia, with 10 year  probability of major ostial parotic fracture 25%, and hip fracture 15%.  -Given the high risk of fracture, she would benefit from bisphosphonate or Prolia injection. I previously discussed the benefit and potential side effects, and written material of Prolia was given. She has not done Prolia injection yet and she is hesitant to due to the concern of side effects. I will discuss the options of adjuvant Zometa after her chemotherapy.  -Patient most recent DEXA scan on 09/20/2017 showed: Osteopenia, very high risk of fracture, 29% for major osteoporotic fracture and 18% for hip fracture.    4. Hypercalcemia  -She knows to drink adequately and avoid dehydration  -She was previously instructed to restart Vit D in 03/2017 as well as continue light weight bearing exercise.  -She'll follow up with primary care physician for further workup.  -Ca resolved currently   5. Diabetes and hyperglycemia  -She noticed hyperglycemia since she started steroids for her left hip pain. I previously suggested she stop taking her steroid medication as she no longer needs it.  -I previously encouraged her to f/u with PCP to see if she needs to change her metformin medication or dosage as her sugar has been higher lately.  -BG at 118 today, stable (02/25/18)  6. Social Support -She lives at [facility] and is very independent -Her children live close by her   7. Chemotherapy induced anemia   -Hb 9.5-10.5 lately, no need for blood transfusion -continue monitoring   8. Epistaxis -s/p cauterization and packing on 10/5, No other occurrence since   -Continue to follow up with ENT -on eliquis -No epistaxis lately  9.  Peripheral neuropathy, grade 1 -She has developed tingling on her fingers, no significant numbness or tingling on feet, no impact on her hand function. -She will use ice pack for her hands during the chemotherapy today -I encouraged her to take B complex  -will monitor    Plan -Labs reviewed and adequate to proceed with Last cycle Abraxane today  -she is scheduled to see Dr. Isidore Moos in a few weeks  -f/u on 11/20     I spent 20 minutes counseling the patient face to face.  The total time spent in the appointment was 25 minutes.  Oneal Deputy, am acting as scribe for Truitt Merle, MD.   I have reviewed the above documentation for accuracy and  completeness, and I agree with the above.      Truitt Merle   02/25/2018

## 2018-02-21 NOTE — Telephone Encounter (Signed)
No los 10/21

## 2018-02-22 NOTE — Telephone Encounter (Signed)
Per 10/18 los NOT 10/21 los

## 2018-02-25 ENCOUNTER — Inpatient Hospital Stay: Payer: Medicare Other | Admitting: Hematology

## 2018-02-25 ENCOUNTER — Inpatient Hospital Stay: Payer: Medicare Other

## 2018-02-25 ENCOUNTER — Encounter: Payer: Self-pay | Admitting: Hematology

## 2018-02-25 VITALS — BP 134/93 | HR 114 | Temp 97.4°F | Resp 17 | Wt 132.8 lb

## 2018-02-25 DIAGNOSIS — Z170 Estrogen receptor positive status [ER+]: Principal | ICD-10-CM

## 2018-02-25 DIAGNOSIS — Z171 Estrogen receptor negative status [ER-]: Principal | ICD-10-CM

## 2018-02-25 DIAGNOSIS — C50412 Malignant neoplasm of upper-outer quadrant of left female breast: Secondary | ICD-10-CM

## 2018-02-25 DIAGNOSIS — D6481 Anemia due to antineoplastic chemotherapy: Secondary | ICD-10-CM

## 2018-02-25 DIAGNOSIS — C50411 Malignant neoplasm of upper-outer quadrant of right female breast: Secondary | ICD-10-CM | POA: Diagnosis not present

## 2018-02-25 DIAGNOSIS — Z5111 Encounter for antineoplastic chemotherapy: Secondary | ICD-10-CM | POA: Diagnosis not present

## 2018-02-25 DIAGNOSIS — M8580 Other specified disorders of bone density and structure, unspecified site: Secondary | ICD-10-CM | POA: Diagnosis not present

## 2018-02-25 DIAGNOSIS — Z87891 Personal history of nicotine dependence: Secondary | ICD-10-CM

## 2018-02-25 DIAGNOSIS — Z853 Personal history of malignant neoplasm of breast: Secondary | ICD-10-CM

## 2018-02-25 DIAGNOSIS — Z95828 Presence of other vascular implants and grafts: Secondary | ICD-10-CM

## 2018-02-25 DIAGNOSIS — E1165 Type 2 diabetes mellitus with hyperglycemia: Secondary | ICD-10-CM

## 2018-02-25 DIAGNOSIS — E1142 Type 2 diabetes mellitus with diabetic polyneuropathy: Secondary | ICD-10-CM

## 2018-02-25 LAB — CBC WITH DIFFERENTIAL (CANCER CENTER ONLY)
Abs Immature Granulocytes: 0.27 10*3/uL — ABNORMAL HIGH (ref 0.00–0.07)
BASOS ABS: 0.1 10*3/uL (ref 0.0–0.1)
BASOS PCT: 1 %
EOS PCT: 1 %
Eosinophils Absolute: 0 10*3/uL (ref 0.0–0.5)
HCT: 28.7 % — ABNORMAL LOW (ref 36.0–46.0)
Hemoglobin: 9.2 g/dL — ABNORMAL LOW (ref 12.0–15.0)
Immature Granulocytes: 6 %
LYMPHS PCT: 27 %
Lymphs Abs: 1.2 10*3/uL (ref 0.7–4.0)
MCH: 33.7 pg (ref 26.0–34.0)
MCHC: 32.1 g/dL (ref 30.0–36.0)
MCV: 105.1 fL — AB (ref 80.0–100.0)
MONO ABS: 0.4 10*3/uL (ref 0.1–1.0)
Monocytes Relative: 8 %
NEUTROS ABS: 2.5 10*3/uL (ref 1.7–7.7)
NRBC: 1.6 % — AB (ref 0.0–0.2)
Neutrophils Relative %: 57 %
PLATELETS: 233 10*3/uL (ref 150–400)
RBC: 2.73 MIL/uL — AB (ref 3.87–5.11)
RDW: 18.5 % — ABNORMAL HIGH (ref 11.5–15.5)
WBC: 4.4 10*3/uL (ref 4.0–10.5)

## 2018-02-25 LAB — CMP (CANCER CENTER ONLY)
ALT: 23 U/L (ref 0–44)
AST: 21 U/L (ref 15–41)
Albumin: 3.7 g/dL (ref 3.5–5.0)
Alkaline Phosphatase: 96 U/L (ref 38–126)
Anion gap: 12 (ref 5–15)
BILIRUBIN TOTAL: 0.4 mg/dL (ref 0.3–1.2)
BUN: 16 mg/dL (ref 8–23)
CHLORIDE: 107 mmol/L (ref 98–111)
CO2: 23 mmol/L (ref 22–32)
CREATININE: 0.98 mg/dL (ref 0.44–1.00)
Calcium: 9.1 mg/dL (ref 8.9–10.3)
GFR, EST NON AFRICAN AMERICAN: 53 mL/min — AB (ref >60)
Glucose, Bld: 118 mg/dL — ABNORMAL HIGH (ref 70–99)
POTASSIUM: 3.7 mmol/L (ref 3.5–5.1)
Sodium: 142 mmol/L (ref 135–145)
TOTAL PROTEIN: 6.5 g/dL (ref 6.5–8.1)

## 2018-02-25 MED ORDER — PACLITAXEL PROTEIN-BOUND CHEMO INJECTION 100 MG: 70.0000 mg/m2 | Freq: Once | Status: DC

## 2018-02-25 MED ORDER — PROCHLORPERAZINE MALEATE 10 MG PO TABS
10.0000 mg | ORAL_TABLET | Freq: Once | ORAL | Status: AC
  Administered 2018-02-25: 10 mg via ORAL

## 2018-02-25 MED ORDER — PROCHLORPERAZINE MALEATE 10 MG PO TABS
ORAL_TABLET | ORAL | Status: AC
  Filled 2018-02-25: qty 1

## 2018-02-25 MED ORDER — PACLITAXEL PROTEIN-BOUND CHEMO INJECTION 100 MG
70.0000 mg/m2 | Freq: Once | INTRAVENOUS | Status: AC
  Administered 2018-02-25: 125 mg via INTRAVENOUS
  Filled 2018-02-25: qty 25

## 2018-02-25 MED ORDER — HEPARIN SOD (PORK) LOCK FLUSH 100 UNIT/ML IV SOLN
500.0000 [IU] | Freq: Once | INTRAVENOUS | Status: AC | PRN
  Administered 2018-02-25: 500 [IU]
  Filled 2018-02-25: qty 5

## 2018-02-25 MED ORDER — SODIUM CHLORIDE 0.9% FLUSH
10.0000 mL | Freq: Once | INTRAVENOUS | Status: AC
  Administered 2018-02-25: 10 mL
  Filled 2018-02-25: qty 10

## 2018-02-25 MED ORDER — SODIUM CHLORIDE 0.9% FLUSH
10.0000 mL | INTRAVENOUS | Status: DC | PRN
  Administered 2018-02-25: 10 mL
  Filled 2018-02-25: qty 10

## 2018-02-25 MED ORDER — SODIUM CHLORIDE 0.9 % IV SOLN
Freq: Once | INTRAVENOUS | Status: AC
  Administered 2018-02-25: 12:00:00 via INTRAVENOUS
  Filled 2018-02-25: qty 250

## 2018-02-25 NOTE — Patient Instructions (Signed)
[facility] Discharge Instructions for Patients Receiving Chemotherapy  Today you received the following chemotherapy agents:  Abraxane.  To help prevent nausea and vomiting after your treatment, we encourage you to take your nausea medication as directed.   If you develop nausea and vomiting that is not controlled by your nausea medication, call the clinic.   BELOW ARE SYMPTOMS THAT SHOULD BE REPORTED IMMEDIATELY:  *FEVER GREATER THAN 100.5 F  *CHILLS WITH OR WITHOUT FEVER  NAUSEA AND VOMITING THAT IS NOT CONTROLLED WITH YOUR NAUSEA MEDICATION  *UNUSUAL SHORTNESS OF BREATH  *UNUSUAL BRUISING OR BLEEDING  TENDERNESS IN MOUTH AND THROAT WITH OR WITHOUT PRESENCE OF ULCERS  *URINARY PROBLEMS  *BOWEL PROBLEMS  UNUSUAL RASH Items with * indicate a potential emergency and should be followed up as soon as possible.  Feel free to call the clinic should you have any questions or concerns. The clinic phone number is [phone].  Please show the CHEMO ALERT CARD at check-in to the Emergency Department and triage nurse.   

## 2018-03-07 ENCOUNTER — Telehealth: Payer: Self-pay

## 2018-03-07 NOTE — Telephone Encounter (Signed)
Spoke with patient per Dr. Burr Medico proceed with getting mammogram on 11/7 prior to radiation, explained to patient skin becomes more sensitive with radiation, patient verbalized an understanding and will keep her appointment.

## 2018-03-09 NOTE — Progress Notes (Signed)
Location of Breast Cancer:  upper outer quadrant right breast in female  Histology per Pathology Report:  Diagnosis 09-22-17 1. Breast, right, needle core biopsy - INVASIVE DUCTAL CARCINOMA, MSBR GRADE II/III. - DUCTAL CARCINOMA IN SITU WITH NECROSIS. 2. Breast, left, needle core biopsy - FAT NECROSIS. - NO EVIDENCE OF MALIGNANCY.  Receptor Status: ER(0% -), PR ( 0% -), Her2-neu (-), Ki-(80%)  10/26/17 Diagnosis 1. Breast, lumpectomy, Right - INVASIVE DUCTAL CARCINOMA, GRADE 2, SPANNING 1 CM. - HIGH GRADE DUCTAL CARCINOMA IN SITU WITH NECROSIS. - FINAL RESECTION MARGINS (PARTS #2, 3, 4) ARE NEGATIVE. - BIOPSY SITE. - SEE ONCOLOGY TABLE. 2. Breast, excision, additional anterior margin- 2 pieces right - BENIGN BREAST TISSUE. 3. Breast, excision, Right unoriented anterior margin - BENIGN BREAST TISSUE. 4. Breast, excision, Right deep margin - BENIGN BREAST TISSUE. 5. Lymph node, sentinel, biopsy, Right Axillary - ONE OF ONE LYMPH NODES NEGATIVE FOR CARCINOMA (0/1).  Did patient present with symptoms or was this found on screening mammography?: Screening mamogram  Past/Anticipated interventions by surgeon, if any:  10/26/17 Dr. Excell Seltzer Procedure: Procedure(s): BREAST LUMPECTOMY WITH RADIOACTIVE SEED AND SENTINEL LYMPH NODE BIOPSY Surgeon: Excell Seltzer T   Past/Anticipated interventions by medical oncology, if any: 02/25/18 Dr. Burr Medico  Plan -Labs reviewed and adequate to proceed with Last cycle Abraxane today  -she is scheduled to see Dr. Isidore Moos in a few weeks  -f/u on 11/20    SAFETY ISSUES:  Prior radiation? 2014 Left breast  Pacemaker/ICD? No  Possible current pregnancy? No  Is the patient on methotrexate? : No   Current complaints/issues:  BP (!) 144/89 (BP Location: Left Arm, Patient Position: Sitting)   Pulse 98   Temp 98 F (36.7 C) (Oral)   Resp 20   Ht '5\' 1"'$  (1.549 m)   Wt 132 lb (59.9 kg)   SpO2 98%   BMI 24.94 kg/m     Wt Readings from Last 3 Encounters:  03/15/18 132 lb (59.9 kg)  02/25/18 132 lb 12.8 oz (60.2 kg)  02/18/18 134 lb 14.4 oz (61.2 kg)

## 2018-03-15 ENCOUNTER — Ambulatory Visit
Admission: RE | Admit: 2018-03-15 | Discharge: 2018-03-15 | Disposition: A | Discharge status: Home / Self Care | Payer: Medicare Other | Source: Ambulatory Visit | Attending: Radiation Oncology | Admitting: Radiation Oncology

## 2018-03-15 ENCOUNTER — Other Ambulatory Visit: Payer: Self-pay

## 2018-03-15 ENCOUNTER — Encounter: Payer: Self-pay | Admitting: Radiation Oncology

## 2018-03-15 DIAGNOSIS — Z888 Allergy status to other drugs, medicaments and biological substances status: Secondary | ICD-10-CM | POA: Diagnosis not present

## 2018-03-15 DIAGNOSIS — Z510 Encounter for antineoplastic radiation therapy: Secondary | ICD-10-CM | POA: Diagnosis not present

## 2018-03-15 DIAGNOSIS — Z7984 Long term (current) use of oral hypoglycemic drugs: Secondary | ICD-10-CM | POA: Insufficient documentation

## 2018-03-15 DIAGNOSIS — Z79899 Other long term (current) drug therapy: Secondary | ICD-10-CM | POA: Diagnosis not present

## 2018-03-15 DIAGNOSIS — Z882 Allergy status to sulfonamides status: Secondary | ICD-10-CM | POA: Insufficient documentation

## 2018-03-15 DIAGNOSIS — Z171 Estrogen receptor negative status [ER-]: Secondary | ICD-10-CM | POA: Diagnosis not present

## 2018-03-15 DIAGNOSIS — C50411 Malignant neoplasm of upper-outer quadrant of right female breast: Secondary | ICD-10-CM | POA: Insufficient documentation

## 2018-03-15 DIAGNOSIS — Z880 Allergy status to penicillin: Secondary | ICD-10-CM | POA: Insufficient documentation

## 2018-03-15 DIAGNOSIS — I4891 Unspecified atrial fibrillation: Secondary | ICD-10-CM | POA: Diagnosis not present

## 2018-03-15 DIAGNOSIS — Z7901 Long term (current) use of anticoagulants: Secondary | ICD-10-CM | POA: Diagnosis not present

## 2018-03-15 NOTE — Progress Notes (Signed)
  Radiation Oncology         [phone] ________________________________  Name: Adrienne [name] MRN: [id number]  Date: 03/15/2018  DOB: 05/12/1934  SIMULATION AND TREATMENT PLANNING NOTE    Outpatient  DIAGNOSIS:     ICD-10-CM   1. Malignant neoplasm of upper-outer quadrant of right breast in female, estrogen receptor negative ([place]) C50.411    Z17.1     NARRATIVE:  The patient was brought to the [facility].  Identity was confirmed.  All relevant records and images related to the planned course of therapy were reviewed.  The patient freely provided informed written consent to proceed with treatment after reviewing the details related to the planned course of therapy. The consent form was witnessed and verified by the simulation staff.    Then, the patient was set-up in a stable reproducible supine position for radiation therapy with her ipsilateral arm over her head, and her upper body secured in a custom-made Vac-lok device.  CT images were obtained.  Surface markings were placed.  The CT images were loaded into the planning software.    TREATMENT PLANNING NOTE: Treatment planning then occurred.  The radiation prescription was entered and confirmed.     A total of 3 medically necessary complex treatment devices were fabricated and supervised by me: 2 fields with MLCs for custom blocks to protect heart, and lungs;  and, a Vac-lok. MORE COMPLEX DEVICES MAY BE MADE IN DOSIMETRY FOR FIELD IN FIELD BEAMS FOR DOSE HOMOGENEITY.  I have requested : 3D Simulation which is medically necessary to give adequate dose to at risk tissues while sparing lungs and heart.  I have requested a DVH of the following structures: lungs, heart, right lumpectomy cavity.    The patient will receive 40.05 Gy in 15 fractions to the right breast with 2 tangential fields.  This will be followed by a boost.  Optical Surface Tracking Plan:  Since intensity modulated radiotherapy (IMRT) and 3D conformal  radiation treatment methods are predicated on accurate and precise positioning for treatment, intrafraction motion monitoring is medically necessary to ensure accurate and safe treatment delivery. The ability to quantify intrafraction motion without excessive ionizing radiation dose can only be performed with optical surface tracking. Accordingly, surface imaging offers the opportunity to obtain 3D measurements of patient position throughout IMRT and 3D treatments without excessive radiation exposure. I am ordering optical surface tracking for this patient's upcoming course of radiotherapy.  ________________________________   Reference:  Ursula Alert, J, et al. Surface imaging-based analysis of intrafraction motion for breast radiotherapy patients.Journal of [place], n. 6, nov. 2014. ISSN [id number].  Available at: <http://www.jacmp.org/index.php/jacmp/article/view/4957>.    -----------------------------------  Eppie Gibson, MD

## 2018-03-15 NOTE — Progress Notes (Signed)
Radiation Oncology         [phone] ________________________________  Name: Adrienne [name] MRN: [id number]  Date: 03/15/2018  DOB: 06-May-1934  Follow-Up Visit Note  Outpatient  CC: Adrienne Peng, NP  Truitt Merle, MD  Diagnosis:      ICD-10-CM   1. Malignant neoplasm of upper-outer quadrant of right breast in female, estrogen receptor negative ([place]) C50.411    Z17.1     Stage IB (pT1b, pN0, cM0) Right Breast UOQ Invasive Ductal Carcinoma, ER(-) / PR(-) / Her2(-), Grade 2   Cancer Staging Breast cancer of upper-outer quadrant of right female breast [facility]) Staging form: Breast, AJCC 8th Edition - Clinical stage from 09/22/2017: Stage IB (cT1b, cN0, cM0, G2, ER-, PR-, HER2-) - Signed by Truitt Merle, MD on 10/08/2017 pT1b, pN0  Specimen Gross and Clinical Information Specimen(s) Obtained: 1. Breast, lumpectomy, Right 2 of Breast cancer, left breast ([place]) Staging form: Breast, AJCC 7th Edition - Clinical stage from 09/12/2012: Stage IA (T1b, N0, M0) - Unsigned  CHIEF COMPLAINT: Here to discuss management of right breast cancer  Narrative:  The patient returns today with her friend for follow-up.    Breast/nodal surgery on date of 10/26/17 revealed: tumor size of 1 cm; histology of invasive ductal carcinoma, with high grade DCIS and necrosis; margins are negative to invasive and in situ disease by 10 mm; nodal status of negative (0/1 SLN);  ER status: negative; PR status negative, Her2 status negative; Grade 2.  Systemic therapy, if applicable, involved (dates and therapy as follows): Adjuvant weekly Abraxane for 12 weeks, from 12/03/17 to 02/25/18. She tolerated this therapy moderately well. She is scheduled to have her Port-A-Cath removed on 05/02/18.  Symptomatically, the patient reports: fatigue and somewhat labored breathing related to chemotherapy. She reports having peripheral neuropathy and fingernail changes following her last chemotherapy treatment that have since dissipated.   Baseline A-Fib.        ALLERGIES:  is allergic to penicillins; other; prednisone; radiaplexrx [woun'dres hydrogel wound dress]; and sulfamethoxazole.  Meds: Current Outpatient Medications  Medication Sig Dispense Refill  . ACCU-CHEK AVIVA PLUS test strip USE TO TEST TWICE DAILY **ICD-10 CODE E13.9** 200 each 3  . allopurinol (ZYLOPRIM) 300 MG tablet TAKE 1 TABLET (300 MG TOTAL) BY MOUTH DAILY. **DNF 04/06/15** 90 tablet 3  . atenolol (TENORMIN) 25 MG tablet TAKE 1 TABLET BY MOUTH EVERY DAY 90 tablet 2  . chlorpheniramine (CHLOR-TRIMETON) 4 MG tablet Take 4 mg 2 (two) times daily as needed by mouth for allergies.    Marland Kitchen diltiazem (CARDIZEM CD) 360 MG 24 hr capsule TAKE 1 CAPSULE (360 MG TOTAL) BY MOUTH DAILY. 90 capsule 3  . ELIQUIS 5 MG TABS tablet TAKE 1 TABLET BY MOUTH TWICE A DAY 180 tablet 1  . fish oil-omega-3 fatty acids 1000 MG capsule Take 1 g by mouth daily.    Marland Kitchen loperamide (IMODIUM) 2 MG capsule Take 2 mg by mouth as needed for diarrhea or loose stools.    . metFORMIN (GLUCOPHAGE) 500 MG tablet Take 1 tablet (500 mg total) by mouth 2 (two) times daily. 180 tablet 3  . Multiple Vitamin (MULTIVITAMIN) tablet Take 1 tablet by mouth daily.    Marland Kitchen OVER THE COUNTER MEDICATION Take by mouth 2 (two) times daily. Presavision- for macular degeneration prevention    . VITAMIN D, ERGOCALCIFEROL, PO Take by mouth.    . lidocaine-prilocaine (EMLA) cream Apply 1 application topically as needed. (Patient not taking: Reported on 03/15/2018) 30 g 3  .  ondansetron (ZOFRAN) 8 MG tablet Take 1 tablet (8 mg total) by mouth 2 (two) times daily as needed (Nausea or vomiting). (Patient not taking: Reported on 02/11/2018) 30 tablet 1  . prochlorperazine (COMPAZINE) 10 MG tablet Take 1 tablet (10 mg total) by mouth every 6 (six) hours as needed (Nausea or vomiting). (Patient not taking: Reported on 02/11/2018) 30 tablet 1  . triamcinolone lotion (KENALOG) 0.1 % Apply 1 application topically 3 (three) times daily.  (Patient not taking: Reported on 02/11/2018) 60 mL 1   No current facility-administered medications for this encounter.     Physical Findings:  height is '5\' 1"'$  (1.549 m) and weight is 132 lb (59.9 kg). Her oral temperature is 98 F (36.7 C). Her blood pressure is 144/89 (abnormal) and her pulse is 98. Her respiration is 20 and oxygen saturation is 98%.   General: Alert and oriented, in no acute distress.  HEENT: Head is normocephalic. She has bilateral hearing aids.  Heart: Regular in rate and irregularly irregular rhythm. No murmurs. Chest: Clear to auscultation bilaterally, with no rhonchi, wheezes, or rales. Abdomen: Soft, nontender, nondistended, with no rigidity or guarding. Extremities: She has an orange tint to her fingernails. Neurologic: No obvious focalities. Speech is fluent.  Psychiatric: Judgment and insight are intact. Affect is appropriate. Breast exam reveals nipple inversion bilaterally. The scar in the UOQ of the breast and the axilla on the right are healing well. She has a little bit of thickening consistent with scar tissue lateral to the lumpectomy scar. The thickening measures about 2 cm. In the left breast she has an UOQ lumpectomy scar that has healed well. No palpable masses on the left. She has a Port-A-Cath in the upper right chest with a slightly purplish hue to the skin.  Lab Findings: Lab Results  Component Value Date   WBC 4.4 02/25/2018   HGB 9.2 (L) 02/25/2018   HCT 28.7 (L) 02/25/2018   MCV 105.1 (H) 02/25/2018   PLT 233 02/25/2018    Radiographic Findings: No results found.  Impression/Plan: Right Breast Cancer  We discussed adjuvant radiotherapy today.  I recommend adjuvant RT to the right breast in order to  reduce risk of locoregional recurrence by 2/3.  The risks, benefits and side effects of this treatment were discussed in detail.  Since she has received radiotherapy in the past, she fully understands that radiotherapy is associated with  skin irritation and fatigue in the acute setting. Late effects can include cosmetic changes and rare injury to internal organs.   She is enthusiastic about proceeding with treatment. A consent form has been signed and placed in her chart. She will undergo CT simulation/treatment planning today with treatment to begin on Monday November 18th.  A total of 3 medically necessary complex treatment devices will be fabricated and supervised by me: 2 fields with MLCs for custom blocks to protect heart, and lungs;  and, a Vac-lok. MORE COMPLEX DEVICES MAY BE MADE IN DOSIMETRY FOR FIELD IN FIELD BEAMS FOR DOSE HOMOGENEITY.  I have requested : 3D Simulation which is medically necessary to give adequate dose to at risk tissues while sparing lungs and heart.  I have requested a DVH of the following structures: lungs, heart, right lumpectomy cavity.    The patient will receive 40.05 Gy in 15 fractions to the right breast with 2 fields.  This will be followed by a boost.  The patient has an irregularly irregular rhythm and known A-Fib. She is on Eliquis for this.  I spent 25 minutes face to face with the patient and more than 50% of that time was spent in counseling and/or coordination of care. _____________________________________   Eppie Gibson, MD  This document serves as a record of services personally performed by Eppie Gibson, MD. It was created on her behalf by Rae Lips, a trained medical scribe. The creation of this record is based on the scribe's personal observations and the provider's statements to them. This document has been checked and approved by the attending provider.

## 2018-03-17 ENCOUNTER — Other Ambulatory Visit: Payer: Self-pay | Admitting: Family Medicine

## 2018-03-17 ENCOUNTER — Encounter: Payer: Self-pay | Admitting: Family Medicine

## 2018-03-17 DIAGNOSIS — E139 Other specified diabetes mellitus without complications: Secondary | ICD-10-CM

## 2018-03-17 MED ORDER — METFORMIN HCL 500 MG PO TABS: 500.0000 mg | ORAL_TABLET | Freq: Two times a day (BID) | ORAL | 0 refills | Status: DC

## 2018-03-17 NOTE — Telephone Encounter (Signed)
Ok to refill for 90 days but we really need to get her in after the holidays

## 2018-03-17 NOTE — Telephone Encounter (Signed)
Filled for 90 days and a letter sent by mail.

## 2018-03-18 DIAGNOSIS — Z510 Encounter for antineoplastic radiation therapy: Secondary | ICD-10-CM | POA: Diagnosis not present

## 2018-03-21 ENCOUNTER — Ambulatory Visit
Admission: RE | Admit: 2018-03-21 | Discharge: 2018-03-21 | Disposition: A | Discharge status: Home / Self Care | Payer: Medicare Other | Source: Ambulatory Visit | Attending: Radiation Oncology | Admitting: Radiation Oncology

## 2018-03-21 ENCOUNTER — Telehealth: Payer: Self-pay | Admitting: Hematology

## 2018-03-21 DIAGNOSIS — Z171 Estrogen receptor negative status [ER-]: Principal | ICD-10-CM

## 2018-03-21 DIAGNOSIS — Z510 Encounter for antineoplastic radiation therapy: Secondary | ICD-10-CM | POA: Diagnosis not present

## 2018-03-21 DIAGNOSIS — C50411 Malignant neoplasm of upper-outer quadrant of right female breast: Secondary | ICD-10-CM

## 2018-03-21 MED ORDER — ALRA NON-METALLIC DEODORANT (RAD-ONC)
1.0000 "application " | Freq: Once | TOPICAL | Status: AC
  Administered 2018-03-21: 1 via TOPICAL

## 2018-03-21 MED ORDER — SONAFINE EX EMUL
1.0000 "application " | Freq: Once | CUTANEOUS | Status: AC
  Administered 2018-03-21: 1 via TOPICAL

## 2018-03-21 NOTE — Telephone Encounter (Signed)
Per  11/17 sch message - per patient request to keep appt on the same date - did not want to r./s per sch message.

## 2018-03-21 NOTE — Progress Notes (Signed)
Pt here for patient teaching.  Pt given Radiation and You booklet, skin care instructions, Alra deodorant and Sonafine.  Reviewed areas of pertinence such as fatigue, skin changes, breast tenderness and breast swelling . Pt able to give teach back of to pat skin, use unscented/gentle soap and drink plenty of water,apply Sonafine bid, avoid applying anything to skin within 4 hours of treatment, avoid wearing an under wire bra and to use an electric razor if they must shave. Pt verbalizes understanding of information given and will contact nursing with any questions or concerns.     Http://[email or web address]/treatmentinformation/whattoexpect/index      

## 2018-03-22 ENCOUNTER — Ambulatory Visit
Admission: RE | Admit: 2018-03-22 | Discharge: 2018-03-22 | Disposition: A | Discharge status: Home / Self Care | Payer: Medicare Other | Source: Ambulatory Visit | Attending: Radiation Oncology | Admitting: Radiation Oncology

## 2018-03-22 DIAGNOSIS — Z510 Encounter for antineoplastic radiation therapy: Secondary | ICD-10-CM | POA: Diagnosis not present

## 2018-03-22 NOTE — Progress Notes (Signed)
[facility] Follow-up office visit  Date of Service:  03/23/2018   ID: Adrienne [name] OB: 1934-06-13  MR#: [id number]  BTD#:[id number]  PCP: [name], NP      CHIEF COMPLAINT: Follow up for right breast cancer, triple negative   Oncology History   Cancer Staging Breast cancer of upper-outer quadrant of right female breast [facility]) Staging form: Breast, AJCC 8th Edition - Clinical stage from 09/22/2017: Stage IB (cT1b, cN0, cM0, G2, ER-, PR-, HER2-) - Signed by Truitt Merle, MD on 10/08/2017  Breast cancer, left breast [facility]) Staging form: Breast, AJCC 7th Edition - Clinical stage from 09/12/2012: Stage IA (T1b, N0, M0) - Unsigned        Breast cancer, left breast ([place])   08/25/2012 Receptors her2    ER 100% positive, PR 88% positive, HER-2 negative, Ki-67 16%    08/30/2012 Initial Diagnosis    Breast cancer, left breast    09/12/2012 Pathology Results    T1bN0 Grade 1 invasive ductal carcinoma, and DCIS.    09/12/2012 Surgery    Left breast lumpectomy and sentinel lymph node biopsy, negative margins.    11/09/2012 - 12/13/2012 Radiation Therapy    Adjuvant breast radiation    12/2012 - 11/2017 Anti-estrogen oral therapy    Anastrozole 1 mg daily, switched to Aromasin 25 mg daily in Jan 2015 due to diarrhea. Her Exemestane was switched to letrozole in 08/2017 due to high copay. She completed her 5 years in 11/2017.     09/01/2016 Imaging    [name] Outside films Breast form [facility] 09/01/2016 IMPRESSION: Probably Benign 1. No significant change in oval hypoechoic masses in the left breast at 11:00 2 cm from the nipple and 10:30 4 cm from the nipple. Both of these masses resemble the area of fat necrosis previously biopsies at 2:00. In addition, both masses have developing central benign or dystrophic appearing calcifications and are favored to be other areas of fat necrosis. A 6 month follow-up left breast mammogram and ultrasound is recommended.    09/01/2016 Mammogram    HM  Mammogram from [facility] 09/01/16 IMPRESSION  Incomplete - additional imaging evaluation needed Ultrasound of the upper medial left breast mass is recommended.     03/09/2017 Mammogram    Previous lumpectomy changes in the upper outer left breast anterior depth with stable associated dystrophic calcifications.  Biopsy clip at the 2:00 in the left breast near the lumpectomy site corresponding to previously biopsied benign fat necrosis.  Persistent round mass in the upper medial left breast with benign-appearing central round calcification.  No significant masses, calcifications, or other findings are seen in the breast  Impression: ultrasound is recommended for the left breast    03/09/2017 Breast US    Left breast ultrasound impression:  No significant change in oval hypoechoic masses in the left breast at 11:00 2 cm from the nipple and 10:30 4 cm from the nipple.  Both of these masses resemble the area of fat necrosis previously biopsied at 2:00.  In addition, both masses have developing central benign or dystrophic appearing calcifications and are favored to be other areas of fat necrosis.  A 62-monthfollow-up bilateral mammogram and left breast ultrasound is recommended.    09/14/2017 Breast [place]   Breast [place] 09/14/17 at [facility]  IMPRESSION:  The 1 cm oval mass in the left breast at 9:30 posterior depth is highly suggestive of malignancy. An Ultrasound guided biopsy is recommended.  The 0.8 cm round mass in  the left breast at 11-12 o'clock anterior depth is suspicious of malignancy. An ultrasound guided biopsy is recommended.     09/22/2017 Pathology Results    Diagnosis 1. Breast, right, needle core biopsy - INVASIVE DUCTAL CARCINOMA, MSBR GRADE II/III. - DUCTAL CARCINOMA IN SITU WITH NECROSIS. 2. Breast, left, needle core biopsy - FAT NECROSIS. - NO EVIDENCE OF MALIGNANCY.    10/17/2017 Genetic Testing    Negative genetic testing on the Multicancer panel.  The Multi-Gene Panel  offered by [facility] includes sequencing and/or deletion duplication testing of the following 83 genes: ALK, APC, ATM, AXIN2,BAP1,  BARD1, BLM, BMPR1A, BRCA1, BRCA2, BRIP1, CASR, CDC73, CDH1, CDK4, CDKN1B, CDKN1C, CDKN2A (p14ARF), CDKN2A (p16INK4a), CEBPA, CHEK2, CTNNA1, DICER1, DIS3L2, EGFR (c.2369C>T, p.Thr790Met variant only), EPCAM (Deletion/duplication testing only), FH, FLCN, GATA2, GPC3, GREM1 (Promoter region deletion/duplication testing only), HOXB13 (c.251G>A, p.Gly84Glu), HRAS, KIT, MAX, MEN1, MET, MITF (c.952G>A, p.Glu318Lys variant only), MLH1, MSH2, MSH3, MSH6, MUTYH, NBN, NF1, NF2, NTHL1, PALB2, PDGFRA, PHOX2B, PMS2, POLD1, POLE, POT1, PRKAR1A, PTCH1, PTEN, RAD50, RAD51C, RAD51D, RB1, RECQL4, RET, RUNX1, SDHAF2, SDHA (sequence changes only), SDHB, SDHC, SDHD, SMAD4, SMARCA4, SMARCB1, SMARCE1, STK11, SUFU, TERT, TERT, TMEM127, TP53, TSC1, TSC2, VHL, WRN and WT1.  The report date is October 17, 2017.     Breast cancer of upper-outer quadrant of right female breast (HCC)   09/14/2017 Mammogram    Diagnostic mammogram at [facility]  IMPRESSION:  The new 0.9 cm oval high density mass in the right breast posterior depth superior region seen on the mediolateral oblique view only is indeterminate. The 1.1 cm focal asymmetry in the left breast central to the nipple anterior depth is indeterminate.     09/14/2017 Imaging    US Bilateral at [facility] IMPRESSION: The 1 cm oval mass in the right breast at 9:30 posterior depth is highly suggestive of malignancy. The 0.8 cm round mass in the left breast at 11-12 o'clock anterior depth is suspicious of malignancy.     09/22/2017 Cancer Staging    Staging form: Breast, AJCC 8th Edition - Clinical stage from 09/22/2017: Stage IB (cT1b, cN0, cM0, G2, ER-, PR-, HER2-) - Signed by Marquitta Persichetti, MD on 10/08/2017    09/22/2017 Pathology Results    Bilateral needle core biopsy 1. Breast, right, needle core biopsy - INVASIVE DUCTAL CARCINOMA, MSBR GRADE II/III. - DUCTAL  CARCINOMA IN SITU WITH NECROSIS. 2. Breast, left, needle core biopsy - FAT NECROSIS. - NO EVIDENCE OF MALIGNANCY.    09/25/2017 Receptors her2    Estrogen Receptor: 0 Progesterone 0 HER2: Negative. Ki-67: 80%     10/08/2017 Initial Diagnosis    Breast cancer of upper-outer quadrant of right female breast (HCC)    10/26/2017 Surgery    BREAST LUMPECTOMY WITH RADIOACTIVE SEED AND SENTINEL LYMPH NODE BIOPSY by Dr. Hoxworth  10/26/17    10/26/2017 Pathology Results    Diagnosis 10/26/17 1. Breast, lumpectomy, Right - INVASIVE DUCTAL CARCINOMA, GRADE 2, SPANNING 1 CM. - HIGH GRADE DUCTAL CARCINOMA IN SITU WITH NECROSIS. - FINAL RESECTION MARGINS (PARTS #2, 3, 4) ARE NEGATIVE. - BIOPSY SITE. - SEE ONCOLOGY TABLE. 2. Breast, excision, additional anterior margin- 2 pieces right - BENIGN BREAST TISSUE. 3. Breast, excision, Right unoriented anterior margin - BENIGN BREAST TISSUE. 4. Breast, excision, Right deep margin - BENIGN BREAST TISSUE. 5. Lymph node, sentinel, biopsy, Right Axillary - ONE OF ONE LYMPH NODES NEGATIVE FOR CARCINOMA (0/1).    10/26/2017 Cancer Staging    Staging form: Breast, AJCC 8th Edition - Pathologic stage   from 10/26/2017: Stage IB (pT1b, pN0, cM0, G2, ER-, PR-, HER2-) - Signed by Truitt Merle, MD on 11/12/2017    12/03/2017 -  Chemotherapy    Weekly Abraxane for 12 weeks 12/03/17-02/25/18    03/2018 -  Radiation Therapy    With Dr. Isidore Moos      CURRENT THERAPY:  Radiation with Dr. Isidore Moos started 03/21/2018   INTERVAL HISTORY:  Mckinzy returns for follow-up for right breast cancer. She recently saw Dr. Isidore Moos for radiation therapy. Today, she is here alone. She is doing well. She is tolerating radiation well. She complains of orange fingernails after chemo. She states that her neuropathy improved and she can now pick small objects up.      PAST MEDICAL HISTORY: Past Medical History:  Diagnosis Date  . Allergy   . Atrial fibrillation ([place])   . Breast cancer  ([place]) 08/25/12   Invasive ductal ca,DCIS  . Diabetes mellitus    type II  . Family history of cancer   . Gout   . Hearing loss   . History of breast cancer   . History of frequent urinary tract infections   . Hx of radiation therapy 11/22/12- 12/19/12   breast 4250 cGy 17 sessions, left breast boost 750 cGy 3 sessions  . Hyperlipidemia   . Hypertension   . Osteopenia     PAST SURGICAL HISTORY: Past Surgical History:  Procedure Laterality Date  . ABDOMINAL HYSTERECTOMY Bilateral 1999   w/b/l salpingo-oopherectomy  . APPENDECTOMY    . BREAST LUMPECTOMY WITH NEEDLE LOCALIZATION AND AXILLARY SENTINEL LYMPH NODE BX Left 09/12/2012   Procedure: LEFT NEEDLE LOCALIZATION BREAST LUMPECTOMY TION AND AXILLARY SENTINEL LYMPH NODE BX;  Surgeon: Edward Jolly, MD;  Location: [place];  Service: General;  Laterality: Left;  . BREAST LUMPECTOMY WITH RADIOACTIVE SEED AND SENTINEL LYMPH NODE BIOPSY Right 10/26/2017   Procedure: BREAST LUMPECTOMY WITH RADIOACTIVE SEED AND SENTINEL LYMPH NODE BIOPSY;  Surgeon: Excell Seltzer, MD;  Location: [place];  Service: General;  Laterality: Right;  . BREAST SURGERY  1990's   right- fibroid cyst  . CHOLECYSTECTOMY    . SEPTOPLASTY    . TONSILLECTOMY      FAMILY HISTORY Family History  Problem Relation Age of Onset  . Hypertension Mother   . Heart disease Mother        Murmur and irregular heart disease  . Dementia Mother        d. 24  . Hypertension Father   . Aneurysm Father 73  . Breast cancer Cousin 23       mat first cousin    GYNECOLOGIC HISTORY: menarche at age 61, g83 p3, s/p TAH/BSO in early 37s.  Was on HRT for several years (cannot recall the duration).  No h/o abnormal pap smears or sexually transmitted infections.    SOCIAL HISTORY: Lives in [place], [place] in a three story house, will move to [facility] soon.  Her husband lives at a nursing home due to dementia.  She is a retired Pharmacist, hospital.  Her son and daughter  live in [place].      ADVANCED DIRECTIVES: Not in place.    HEALTH MAINTENANCE: Social History   Tobacco Use  . Smoking status: Former Smoker    Packs/day: 0.25    Years: 4.00    Pack years: 1.00    Types: Cigarettes  . Smokeless tobacco: Never Used  . Tobacco comment: Cigarette use was 50 years ago  Substance Use Topics  .  Alcohol use: Not Currently  . Drug use: No    Mammogram: 09/14/2017  Colonoscopy: 2011 Bone Density Scan: 03/05/2015 osteopenia  Pap Smear: s/p tah/bso Eye Exam: due Vitamin D Level:  Normal, 04/14/13 Lipid Panel: unsure   Allergies  Allergen Reactions  . Penicillins Hives    Has patient had a PCN reaction causing immediate rash, facial/tongue/throat swelling, SOB or lightheadedness with hypotension: No Has patient had a PCN reaction causing severe rash involving mucus membranes or skin necrosis: No Has patient had a PCN reaction that required hospitalization: No Has patient had a PCN reaction occurring within the last 10 years: No If all of the above answers are "NO", then may proceed with Cephalosporin use.  . Other Hives  . Prednisone Other (See Comments)    ELEVATED BLOOD SUGAR  . Radiaplexrx [Woun'Dres Hydrogel Wound Dress] Hives  . Sulfamethoxazole Nausea Only    Current Outpatient Medications  Medication Sig Dispense Refill  . ACCU-CHEK AVIVA PLUS test strip USE TO TEST TWICE DAILY **ICD-10 CODE E13.9** 200 each 3  . allopurinol (ZYLOPRIM) 300 MG tablet TAKE 1 TABLET (300 MG TOTAL) BY MOUTH DAILY. **DNF 04/06/15** 90 tablet 3  . atenolol (TENORMIN) 25 MG tablet TAKE 1 TABLET BY MOUTH EVERY DAY 90 tablet 2  . chlorpheniramine (CHLOR-TRIMETON) 4 MG tablet Take 4 mg 2 (two) times daily as needed by mouth for allergies.    Marland Kitchen diltiazem (CARDIZEM CD) 360 MG 24 hr capsule TAKE 1 CAPSULE (360 MG TOTAL) BY MOUTH DAILY. 90 capsule 3  . ELIQUIS 5 MG TABS tablet TAKE 1 TABLET BY MOUTH TWICE A DAY 180 tablet 1  . fish oil-omega-3 fatty acids 1000  MG capsule Take 1 g by mouth daily.    Marland Kitchen loperamide (IMODIUM) 2 MG capsule Take 2 mg by mouth as needed for diarrhea or loose stools.    . metFORMIN (GLUCOPHAGE) 500 MG tablet Take 1 tablet (500 mg total) by mouth 2 (two) times daily. 180 tablet 0  . Multiple Vitamin (MULTIVITAMIN) tablet Take 1 tablet by mouth daily.    . ondansetron (ZOFRAN) 8 MG tablet Take 1 tablet (8 mg total) by mouth 2 (two) times daily as needed (Nausea or vomiting). 30 tablet 1  . OVER THE COUNTER MEDICATION Take by mouth 2 (two) times daily. Presavision- for macular degeneration prevention    . prochlorperazine (COMPAZINE) 10 MG tablet Take 1 tablet (10 mg total) by mouth every 6 (six) hours as needed (Nausea or vomiting). 30 tablet 1  . triamcinolone lotion (KENALOG) 0.1 % Apply 1 application topically 3 (three) times daily. 60 mL 1  . VITAMIN D, ERGOCALCIFEROL, PO Take by mouth.    . lidocaine-prilocaine (EMLA) cream Apply 1 application topically as needed. (Patient not taking: Reported on 03/23/2018) 30 g 3   No current facility-administered medications for this visit.    REVIEW OF SYSTEMS:   Constitutional: Denies fevers, chills or abnormal night sweats  Eyes: Denies blurriness of vision, double vision or watery eyes Ears, nose, mouth, throat, and face: Denies mucositis or sore throat Respiratory: Denies cough, dyspnea or wheezes Cardiovascular: Denies palpitation, chest discomfort or lower extremity swelling Gastrointestinal:  Denies nausea, heartburn or change in bowel habits  Skin: Denies abnormal skin rashes (+) orange finger nails Lymphatics: Denies new lymphadenopathy or easy bruising Neurological:Denies numbness, tingling or new weaknesses Behavioral/Psych: Mood is stable, no new changes  All other systems were reviewed with the patient and are negative.  OBJECTIVE:   Vitals:   03/23/18  1022 03/23/18 1026  BP: 140/70 135/83  Pulse: (!) 105   Resp: 16   Temp: 97.6 F (36.4 C)   SpO2: 97%       Body mass index is 24.58 kg/m.     GENERAL: Patient is a well appearing female in no acute distress HEENT:  Sclerae anicteric.  Oropharynx clear and moist. No ulcerations or evidence of oropharyngeal candidiasis. Neck is supple.  NODES:  No cervical, supraclavicular, or axillary lymphadenopathy palpated.  LUNGS:  Clear to auscultation bilaterally.  No wheezes or rhonchi. HEART:  Regular rate and rhythm. No murmur appreciated. ABDOMEN:  Soft, nontender.  Positive, normoactive bowel sounds. No organomegaly palpated. MSK:  No focal spinal tenderness to palpation. Full range of motion bilaterally in the upper extremities. EXTREMITIES:  No peripheral edema.   SKIN:  Clear with no obvious rashes or skin changes. No nail  dyscrasia. (+) orange fingernails  NEURO:  Nonfocal. Well oriented.  Appropriate affect. Breasts: Breast inspection showed them to be symmetrical with no nipple discharge. (+) S/p Right Breast Lumpectomy: Surgical incision in right breast and axilla healing very well, no discharge. Palpation of the breasts and axilla revealed no obvious mass that I could appreciate.    ECOG FS:1 - Symptomatic but completely ambulatory  LAB RESULTS: CBC Latest Ref Rng & Units 03/23/2018 02/25/2018 02/18/2018  WBC 4.0 - 10.5 K/uL 10.8(H) 4.4 3.2(L)  Hemoglobin 12.0 - 15.0 g/dL 12.0 9.2(L) 8.5(L)  Hematocrit 36.0 - 46.0 % 38.1 28.7(L) 26.3(L)  Platelets 150 - 400 K/uL 209 233 234    CMP Latest Ref Rng & Units 03/23/2018 02/25/2018 02/18/2018  Glucose 70 - 99 mg/dL 196(H) 118(H) 162(H)  BUN 8 - 23 mg/dL 27(H) 16 17  Creatinine 0.44 - 1.00 mg/dL 1.23(H) 0.98 1.00  Sodium 135 - 145 mmol/L 142 142 142  Potassium 3.5 - 5.1 mmol/L 4.1 3.7 3.6  Chloride 98 - 111 mmol/L 107 107 110  CO2 22 - 32 mmol/L '26 23 22  '$ Calcium 8.9 - 10.3 mg/dL 9.8 9.1 9.3  Total Protein 6.5 - 8.1 g/dL 6.9 6.5 6.3(L)  Total Bilirubin 0.3 - 1.2 mg/dL 0.5 0.4 0.5  Alkaline Phos 38 - 126 U/L 109 96 92  AST 15 - 41 U/L '22 21  17  '$ ALT 0 - 44 U/L '25 23 17    '$ PATHOLOGY REPORT  Diagnosis 10/26/17 1. Breast, lumpectomy, Right - INVASIVE DUCTAL CARCINOMA, GRADE 2, SPANNING 1 CM. - HIGH GRADE DUCTAL CARCINOMA IN SITU WITH NECROSIS. - FINAL RESECTION MARGINS (PARTS #2, 3, 4) ARE NEGATIVE. - BIOPSY SITE. - SEE ONCOLOGY TABLE. 2. Breast, excision, additional anterior margin- 2 pieces right - BENIGN BREAST TISSUE. 3. Breast, excision, Right unoriented anterior margin - BENIGN BREAST TISSUE. 4. Breast, excision, Right deep margin - BENIGN BREAST TISSUE. 5. Lymph node, sentinel, biopsy, Right Axillary - ONE OF ONE LYMPH NODES NEGATIVE FOR CARCINOMA (0/1). Microscopic Comment 1. INVASIVE CARCINOMA OF THE BREAST: Resection Procedure: Right breast lumpectomy with additional anterior and posterior margin. Right axillary lymph node biopsy. Specimen Laterality: Right. Tumor Size: 1 cm. Histologic Type: Invasive ductal carcinoma. Histologic Grade: Glandular (Acinar)/Tubular Differentiation: 2 Nuclear Pleomorphism: 2 Mitotic Rate: 2 Overall Grade: 2 Ductal Carcinoma In Situ: Present, high grade with necrosis. Tumor Extension: Confined to breast parenchyma. Margins: Distance from closest margin (millimeters): Originally <1 mm anterior, 1 mm posterior. Final anterior and posterior margins (parts 2, 3, 4) are negative. Specify closest margin (required only if <36m): Final margins >10 mm. DCIS Margins  Distance from closest margin (millimeters): Original 2-3 mm anterior, 1 mm posterior. Final anterior and posterior margins (parts 2, 3, 4) are negative. Specify closest margin (required only if <55m): Final margins >10 mm Regional Lymph Nodes: Number of Lymph Nodes Examined: 1 Number of Sentinel Nodes Examined (if applicable): 1 Number of Lymph Nodes with Macrometastases (>2 mm): 0 Number of Lymph Nodes with Micrometastases: 0 Number of Lymph Nodes with Isolated Tumor Cells (0.2 mm or 200 cells)#: 0 Size of Largest  Metastatic Deposit (millimeters): N/A. Extranodal Extension N/A. Treatment Effect: No known presurgical therapy Breast Biomarker Testing Performed on Previous Biopsy: Testing Performed on Case Number: [id number] Receptor: 0 Progesterone 0 HER2: Negative. Ki-67: 80% Representative tumor block: 1A-C. Pathologic Stage Classification (pTNM, AJCC 8th Edition): pT1b, pN0 (v4.2.0.0)   Diagnosis, 09/22/2017 1. Breast, right, needle core biopsy - INVASIVE DUCTAL CARCINOMA, MSBR GRADE II/III. - DUCTAL CARCINOMA IN SITU WITH NECROSIS. 2. Breast, left, needle core biopsy - FAT NECROSIS. - NO EVIDENCE OF MALIGNANCY.   Diagnosis  08/28/2015 Breast, left, needle core biopsy - FAT NECROSIS. - THERE IS NO EVIDENCE OF MALIGNANCY. - SEE COMMENT.         STUDIES: I [facility]  DEXA scan, 09/20/2017  Osteopenia, very high risk of fracture, 29% for major osteoporotic fracture and 18% for hip fracture.   Breast Diagnostic 09/14/17 at [facility]  IMPRESSION:  The new 0.9 cm oval high density mass in the right breast posterior depth superior region seen on the mediolateral oblique view only is indeterminate. The 1.1 cm focal asymmetry in the left breast central to the nipple anterior depth is indeterminate.   [place], 09/14/2017 at [facility] IMPRESSION: The 1 cm oval mass in the right breast at 9:30 posterior depth is highly suggestive of malignancy. The 0.8 cm round mass in the left breast at 11-12 o'clock anterior depth is suspicious of malignancy.    [place] Breast 03/09/17  IMPRESSION: 1. No significant change in oval masses in the left breast at the 11:00 cm from the nipple and 10:30 4 cm from the nipple.  Both of these masses resemble the area of fat necrosis previously biopsied at 2:00. In addition, both of these masses have developing central benign or dystrophic appearing calcifications and are favored to be other areas of fat  necrosis.  A 6 month follow up bilateral mammogram and left breast [place] recommended.  [place] Breast form [facility] 09/01/2016 IMPRESSION: Probably Benign 1. No significant change in oval hypoechoic masses in the left breast at 11:00 2 cm from the nipple and 10:30 4 cm from the nipple. Both of these masses resemble the area of fat necrosis previously biopsies at 2:00. In addition, both masses have developing central benign or dystrophic appearing calcifications and are favored to be other areas of fat necrosis. A 6 month follow-up left breast mammogram and ultrasound is recommended.  [facility] IMPRESSION  Incomplete - additional imaging evaluation needed Ultrasound of the upper medial left breast mass is recommended.   Left breast mammogram and ultrasound 02/27/2016 Impression: probably benign The 4 mm oval mass in the left breast 12:00 position likely represent fat necrosis, the 3 mm mass in the left breast upper inner quadrant middle depth most likely is fat necrosis, the 4 mm oval cyst in the left breast upper inner quadrant posterior depth is benign. Post surgical scar in the left breast upper outer quadrant anterior depth is  benign.  ASSESSMENT:  83 y.o. [place], [place] woman   1. Breast cancer of upper outer quadrant of right breast, invasive ductal carcinoma, stage IB, pT1bN0M0, grade 2, Triple Negative, Ki67: 80% -We previously discussed that her newly diagnosed right breast is different from her previous left breast cancer. -Her genetic testing was negative -She previously underwent right breast lumpectomy with SLNB by Dr. Excell Seltzer on 10/26/17. I discussed her pathology which shows a 1cm tumor, G2, triple negative tumor was completely resection with negative margin, node negative. I discussed this is more aggressive than her previous cancer.  -She underwent adjuvant chemotherapy with weekly Abraxane, with dose reduction.  She tolerated well overall. -She had  mild peripheral neuropathy towards the end of chemo, resolved now.  -Clinically doing well, has recovered well from chemotherapy, except alopecia and nail change  -She will continue Radiaiton with Dr. Isidore Moos  -We discussed breast cancer surveillance, will plan to see her every 3 to 4 months for the first year, then every 4-6 month, and continue annual mammogram  -Labs reviewed, Hg at 12.0, BG at 196, BUN 27 and Cr 1.23.  Creatinine has slightly increased, I encouraged her to drink water adequately -F/u in 4 months     2. pT1bN0, stage IA invasive ductal carcinoma of the left breast, grade I, ER 100%, PR 88%, Ki-67 16%, HER-2/neu negative. -She previously underwent lumpectomy with sentinel lymph node biopsy on 09/12/2012, s/p radiation therapy from 11/09/2012 through 12/19/2012.  -Her left breast biopsy in 08/2015 showed fat necrosis. No clinical evidence of recurrence. -Her repeated mammogram in May 2018 showed 2 lesions in UIQ of left breast, stable, likely benign (fat necrosis per biopsy in 2017) -Left breast mammogram and ultrasound on 03/09/2017 reveal hypoechoic masses that have developed central benign or dystrophic appearing calcifications and are favored to be areas of fat necrosis. -She was on adjuvant Exemestane, tolerated well -09/16/17 US showed mass behind her left breast scar and a mass In her right breast, both highly suggestive of malignancy.  -Her Exemestane was switched to letrozole in 08/2017 due to high copay. She tolerated well and completed in 11/2017.   3. Osteopenia -She is on vitamin D supplement daily. -Given her worsening hypercalcemia, I told her to stop calcium  -Her bone density scan from 03/05/2015 showed osteopenia, with 10 year probability of major ostial parotic fracture 25%, and hip fracture 15%.  -Given the high risk of fracture, she would benefit from bisphosphonate or Prolia injection. I previously discussed the benefit and potential side effects, and written  material of Prolia was given. She has not done Prolia injection yet and she is hesitant to due to the concern of side effects. -Patient most recent DEXA scan on 09/20/2017 showed: Osteopenia, very high risk of fracture, 29% for major osteoporotic fracture and 18% for hip fracture.    4. Hypercalcemia  -She knows to drink adequately and avoid dehydration  -She was previously instructed to restart Vit D in 03/2017 as well as continue light weight bearing exercise.  -She'll follow up with primary care physician for further workup.  -Ca resolved currently   5. Diabetes and hyperglycemia  -She noticed hyperglycemia since she started steroids for her left hip pain. I previously suggested she stop taking her steroid medication as she no longer needs it.  -I previously encouraged her to f/u with PCP to see if she needs to change her metformin medication or dosage as her sugar has been higher lately.  -BG at 196 today  6. Social Support -She lives at [facility] and is very independent -Her children live close by her   7.  Peripheral neuropathy, grade 1, resolved now  -She has developed tingling on her fingers secondary to chemo, no significant numbness or tingling on feet, no impact on her hand function. -She will use ice pack for her hands during the chemotherapy -I encouraged her to take B complex  -resolved now   Plan -Tinea adjuvant radiation  -f/u in 4 months with labs , then every 6 months    I spent 20 minutes counseling the patient face to face.  The total time spent in the appointment was 25 minutes.  Dierdre Searles Dweik am acting as scribe for Dr. Truitt Merle.  I have reviewed the above documentation for accuracy and completeness, and I agree with the above.     Truitt Merle   03/23/2018

## 2018-03-23 ENCOUNTER — Ambulatory Visit
Admission: RE | Admit: 2018-03-23 | Discharge: 2018-03-23 | Disposition: A | Discharge status: Home / Self Care | Payer: Medicare Other | Source: Ambulatory Visit | Attending: Radiation Oncology | Admitting: Radiation Oncology

## 2018-03-23 ENCOUNTER — Encounter: Payer: Self-pay | Admitting: Hematology

## 2018-03-23 ENCOUNTER — Inpatient Hospital Stay: Payer: Medicare Other | Attending: Hematology

## 2018-03-23 ENCOUNTER — Inpatient Hospital Stay: Payer: Medicare Other | Admitting: Hematology

## 2018-03-23 VITALS — BP 135/83 | HR 105 | Temp 97.6°F | Resp 16 | Ht 61.0 in | Wt 130.1 lb

## 2018-03-23 DIAGNOSIS — E1165 Type 2 diabetes mellitus with hyperglycemia: Secondary | ICD-10-CM

## 2018-03-23 DIAGNOSIS — Z170 Estrogen receptor positive status [ER+]: Principal | ICD-10-CM

## 2018-03-23 DIAGNOSIS — Z87891 Personal history of nicotine dependence: Secondary | ICD-10-CM | POA: Insufficient documentation

## 2018-03-23 DIAGNOSIS — Z171 Estrogen receptor negative status [ER-]: Secondary | ICD-10-CM

## 2018-03-23 DIAGNOSIS — C50411 Malignant neoplasm of upper-outer quadrant of right female breast: Secondary | ICD-10-CM

## 2018-03-23 DIAGNOSIS — Z853 Personal history of malignant neoplasm of breast: Secondary | ICD-10-CM | POA: Insufficient documentation

## 2018-03-23 DIAGNOSIS — C50412 Malignant neoplasm of upper-outer quadrant of left female breast: Secondary | ICD-10-CM

## 2018-03-23 DIAGNOSIS — Z510 Encounter for antineoplastic radiation therapy: Secondary | ICD-10-CM | POA: Diagnosis not present

## 2018-03-23 DIAGNOSIS — M8580 Other specified disorders of bone density and structure, unspecified site: Secondary | ICD-10-CM | POA: Insufficient documentation

## 2018-03-23 DIAGNOSIS — E1140 Type 2 diabetes mellitus with diabetic neuropathy, unspecified: Secondary | ICD-10-CM | POA: Diagnosis not present

## 2018-03-23 DIAGNOSIS — Z7984 Long term (current) use of oral hypoglycemic drugs: Secondary | ICD-10-CM | POA: Insufficient documentation

## 2018-03-23 LAB — COMPREHENSIVE METABOLIC PANEL
ALK PHOS: 109 U/L (ref 38–126)
ALT: 25 U/L (ref 0–44)
AST: 22 U/L (ref 15–41)
Albumin: 3.8 g/dL (ref 3.5–5.0)
Anion gap: 9 (ref 5–15)
BILIRUBIN TOTAL: 0.5 mg/dL (ref 0.3–1.2)
BUN: 27 mg/dL — ABNORMAL HIGH (ref 8–23)
CALCIUM: 9.8 mg/dL (ref 8.9–10.3)
CHLORIDE: 107 mmol/L (ref 98–111)
CO2: 26 mmol/L (ref 22–32)
CREATININE: 1.23 mg/dL — AB (ref 0.44–1.00)
GFR, EST AFRICAN AMERICAN: 46 mL/min — AB (ref >60)
GFR, EST NON AFRICAN AMERICAN: 40 mL/min — AB (ref >60)
Glucose, Bld: 196 mg/dL — ABNORMAL HIGH (ref 70–99)
Potassium: 4.1 mmol/L (ref 3.5–5.1)
Sodium: 142 mmol/L (ref 135–145)
TOTAL PROTEIN: 6.9 g/dL (ref 6.5–8.1)

## 2018-03-23 LAB — CBC WITH DIFFERENTIAL (CANCER CENTER ONLY)
Abs Immature Granulocytes: 0.07 10*3/uL (ref 0.00–0.07)
BASOS PCT: 1 %
Basophils Absolute: 0.1 10*3/uL (ref 0.0–0.1)
Eosinophils Absolute: 0.1 10*3/uL (ref 0.0–0.5)
Eosinophils Relative: 1 %
HEMATOCRIT: 38.1 % (ref 36.0–46.0)
Hemoglobin: 12 g/dL (ref 12.0–15.0)
IMMATURE GRANULOCYTES: 1 %
LYMPHS ABS: 0.8 10*3/uL (ref 0.7–4.0)
Lymphocytes Relative: 7 %
MCH: 33.6 pg (ref 26.0–34.0)
MCHC: 31.5 g/dL (ref 30.0–36.0)
MCV: 106.7 fL — AB (ref 80.0–100.0)
MONO ABS: 0.5 10*3/uL (ref 0.1–1.0)
MONOS PCT: 5 %
NEUTROS ABS: 9.3 10*3/uL — AB (ref 1.7–7.7)
Neutrophils Relative %: 85 %
PLATELETS: 209 10*3/uL (ref 150–400)
RBC: 3.57 MIL/uL — ABNORMAL LOW (ref 3.87–5.11)
RDW: 15.3 % (ref 11.5–15.5)
WBC Count: 10.8 10*3/uL — ABNORMAL HIGH (ref 4.0–10.5)
nRBC: 0 % (ref 0.0–0.2)

## 2018-03-24 ENCOUNTER — Ambulatory Visit
Admission: RE | Admit: 2018-03-24 | Discharge: 2018-03-24 | Disposition: A | Discharge status: Home / Self Care | Payer: Medicare Other | Source: Ambulatory Visit | Attending: Radiation Oncology | Admitting: Radiation Oncology

## 2018-03-24 DIAGNOSIS — Z510 Encounter for antineoplastic radiation therapy: Secondary | ICD-10-CM | POA: Diagnosis not present

## 2018-03-25 ENCOUNTER — Ambulatory Visit
Admission: RE | Admit: 2018-03-25 | Discharge: 2018-03-25 | Disposition: A | Discharge status: Home / Self Care | Payer: Medicare Other | Source: Ambulatory Visit | Attending: Radiation Oncology | Admitting: Radiation Oncology

## 2018-03-25 DIAGNOSIS — Z510 Encounter for antineoplastic radiation therapy: Secondary | ICD-10-CM | POA: Diagnosis not present

## 2018-03-27 ENCOUNTER — Ambulatory Visit
Admission: RE | Admit: 2018-03-27 | Discharge: 2018-03-27 | Disposition: A | Discharge status: Home / Self Care | Payer: Medicare Other | Source: Ambulatory Visit | Attending: Radiation Oncology | Admitting: Radiation Oncology

## 2018-03-27 DIAGNOSIS — Z510 Encounter for antineoplastic radiation therapy: Secondary | ICD-10-CM | POA: Diagnosis not present

## 2018-03-28 ENCOUNTER — Ambulatory Visit
Admission: RE | Admit: 2018-03-28 | Discharge: 2018-03-28 | Disposition: A | Discharge status: Home / Self Care | Payer: Medicare Other | Source: Ambulatory Visit | Attending: Radiation Oncology | Admitting: Radiation Oncology

## 2018-03-28 DIAGNOSIS — Z510 Encounter for antineoplastic radiation therapy: Secondary | ICD-10-CM | POA: Diagnosis not present

## 2018-03-29 ENCOUNTER — Ambulatory Visit
Admission: RE | Admit: 2018-03-29 | Discharge: 2018-03-29 | Disposition: A | Discharge status: Home / Self Care | Payer: Medicare Other | Source: Ambulatory Visit | Attending: Radiation Oncology | Admitting: Radiation Oncology

## 2018-03-29 DIAGNOSIS — Z510 Encounter for antineoplastic radiation therapy: Secondary | ICD-10-CM | POA: Diagnosis not present

## 2018-03-30 ENCOUNTER — Ambulatory Visit
Admission: RE | Admit: 2018-03-30 | Discharge: 2018-03-30 | Disposition: A | Discharge status: Home / Self Care | Payer: Medicare Other | Source: Ambulatory Visit | Attending: Radiation Oncology | Admitting: Radiation Oncology

## 2018-03-30 DIAGNOSIS — Z510 Encounter for antineoplastic radiation therapy: Secondary | ICD-10-CM | POA: Diagnosis not present

## 2018-04-03 ENCOUNTER — Other Ambulatory Visit: Payer: Self-pay | Admitting: Adult Health

## 2018-04-04 ENCOUNTER — Ambulatory Visit
Admission: RE | Admit: 2018-04-04 | Discharge: 2018-04-04 | Disposition: A | Discharge status: Home / Self Care | Payer: Medicare Other | Source: Ambulatory Visit | Attending: Radiation Oncology | Admitting: Radiation Oncology

## 2018-04-04 DIAGNOSIS — C50411 Malignant neoplasm of upper-outer quadrant of right female breast: Secondary | ICD-10-CM | POA: Insufficient documentation

## 2018-04-04 DIAGNOSIS — Z510 Encounter for antineoplastic radiation therapy: Secondary | ICD-10-CM | POA: Diagnosis not present

## 2018-04-05 ENCOUNTER — Ambulatory Visit
Admission: RE | Admit: 2018-04-05 | Discharge: 2018-04-05 | Disposition: A | Discharge status: Home / Self Care | Payer: Medicare Other | Source: Ambulatory Visit | Attending: Radiation Oncology | Admitting: Radiation Oncology

## 2018-04-05 DIAGNOSIS — Z510 Encounter for antineoplastic radiation therapy: Secondary | ICD-10-CM | POA: Diagnosis not present

## 2018-04-05 NOTE — Telephone Encounter (Signed)
Last filled by Dr. Sherren Mocha.  Please advise.

## 2018-04-06 ENCOUNTER — Ambulatory Visit
Admission: RE | Admit: 2018-04-06 | Discharge: 2018-04-06 | Disposition: A | Discharge status: Home / Self Care | Payer: Medicare Other | Source: Ambulatory Visit | Attending: Radiation Oncology | Admitting: Radiation Oncology

## 2018-04-06 DIAGNOSIS — Z510 Encounter for antineoplastic radiation therapy: Secondary | ICD-10-CM | POA: Diagnosis not present

## 2018-04-07 ENCOUNTER — Ambulatory Visit
Admission: RE | Admit: 2018-04-07 | Discharge: 2018-04-07 | Disposition: A | Discharge status: Home / Self Care | Payer: Medicare Other | Source: Ambulatory Visit | Attending: Radiation Oncology | Admitting: Radiation Oncology

## 2018-04-07 DIAGNOSIS — Z510 Encounter for antineoplastic radiation therapy: Secondary | ICD-10-CM | POA: Diagnosis not present

## 2018-04-08 ENCOUNTER — Ambulatory Visit
Admission: RE | Admit: 2018-04-08 | Discharge: 2018-04-08 | Disposition: A | Discharge status: Home / Self Care | Payer: Medicare Other | Source: Ambulatory Visit | Attending: Radiation Oncology | Admitting: Radiation Oncology

## 2018-04-08 DIAGNOSIS — Z510 Encounter for antineoplastic radiation therapy: Secondary | ICD-10-CM | POA: Diagnosis not present

## 2018-04-11 ENCOUNTER — Ambulatory Visit: Payer: Medicare Other | Admitting: Radiation Oncology

## 2018-04-11 ENCOUNTER — Ambulatory Visit
Admission: RE | Admit: 2018-04-11 | Discharge: 2018-04-11 | Disposition: A | Discharge status: Home / Self Care | Payer: Medicare Other | Source: Ambulatory Visit | Attending: Radiation Oncology | Admitting: Radiation Oncology

## 2018-04-11 DIAGNOSIS — Z510 Encounter for antineoplastic radiation therapy: Secondary | ICD-10-CM | POA: Diagnosis not present

## 2018-04-12 ENCOUNTER — Ambulatory Visit
Admission: RE | Admit: 2018-04-12 | Discharge: 2018-04-12 | Disposition: A | Discharge status: Home / Self Care | Payer: Medicare Other | Source: Ambulatory Visit | Attending: Radiation Oncology | Admitting: Radiation Oncology

## 2018-04-12 DIAGNOSIS — Z510 Encounter for antineoplastic radiation therapy: Secondary | ICD-10-CM | POA: Diagnosis not present

## 2018-04-13 ENCOUNTER — Ambulatory Visit
Admission: RE | Admit: 2018-04-13 | Discharge: 2018-04-13 | Disposition: A | Discharge status: Home / Self Care | Payer: Medicare Other | Source: Ambulatory Visit | Attending: Radiation Oncology | Admitting: Radiation Oncology

## 2018-04-13 DIAGNOSIS — Z510 Encounter for antineoplastic radiation therapy: Secondary | ICD-10-CM | POA: Diagnosis not present

## 2018-04-14 ENCOUNTER — Ambulatory Visit
Admission: RE | Admit: 2018-04-14 | Discharge: 2018-04-14 | Disposition: A | Discharge status: Home / Self Care | Payer: Medicare Other | Source: Ambulatory Visit | Attending: Radiation Oncology | Admitting: Radiation Oncology

## 2018-04-14 DIAGNOSIS — Z510 Encounter for antineoplastic radiation therapy: Secondary | ICD-10-CM | POA: Diagnosis not present

## 2018-04-15 ENCOUNTER — Ambulatory Visit: Payer: Medicare Other

## 2018-04-18 ENCOUNTER — Ambulatory Visit
Admission: RE | Admit: 2018-04-18 | Discharge: 2018-04-18 | Disposition: A | Discharge status: Home / Self Care | Payer: Medicare Other | Source: Ambulatory Visit | Attending: Radiation Oncology | Admitting: Radiation Oncology

## 2018-04-18 DIAGNOSIS — C50411 Malignant neoplasm of upper-outer quadrant of right female breast: Secondary | ICD-10-CM

## 2018-04-18 DIAGNOSIS — Z171 Estrogen receptor negative status [ER-]: Principal | ICD-10-CM

## 2018-04-18 DIAGNOSIS — Z510 Encounter for antineoplastic radiation therapy: Secondary | ICD-10-CM | POA: Diagnosis not present

## 2018-04-18 MED ORDER — SONAFINE EX EMUL
1.0000 "application " | Freq: Once | CUTANEOUS | Status: AC
  Administered 2018-04-18: 1 via TOPICAL

## 2018-04-19 ENCOUNTER — Ambulatory Visit
Admission: RE | Admit: 2018-04-19 | Discharge: 2018-04-19 | Disposition: A | Discharge status: Home / Self Care | Payer: Medicare Other | Source: Ambulatory Visit | Attending: Radiation Oncology | Admitting: Radiation Oncology

## 2018-04-19 DIAGNOSIS — Z510 Encounter for antineoplastic radiation therapy: Secondary | ICD-10-CM | POA: Diagnosis not present

## 2018-04-22 ENCOUNTER — Encounter: Payer: Self-pay | Admitting: Radiation Oncology

## 2018-04-22 NOTE — Progress Notes (Signed)
  Radiation Oncology         [phone] ________________________________  Name: Adrienne [name] MRN: [id number]  Date: 04/22/2018  DOB: [birth year]  End of Treatment Note  Diagnosis: Stage IB (pT1b, pN0, cM0) Right Breast UOQ Invasive Ductal Carcinoma, ER(-) / PR(-) / Her2(-), Grade 2     Indication for treatment:  Curative       Radiation treatment dates:   03/21/2018-04/19/2018  Site/dose:   1. Right breast, 2.67 Gy x 15 fractions for a total dose of 40.05 Gy            2. Boost, 2 Gy x 5 fractions for a total dose of 10 Gy   Beams/energy:   1. 3D, 6X         2. 3D, 6X  Narrative: The patient tolerated radiation treatment relatively well. At the beginning of her treatment, she denied any concerns. She was given Sonafine and instructed on its use. Towards the end of her treatment, she noted soreness to her right axilla and redness to the radiation site. She continued to use sonafine twice daily as instructed. On PE, it was noted that she had redness over her breast, however, her skin was intact.   Plan: The patient has completed radiation treatment. The patient will return to radiation oncology clinic for routine followup in one month. I advised them to call or return sooner if they have any questions or concerns related to their recovery or treatment.  -----------------------------------  Eppie Gibson, MD  This document serves as a record of services personally performed by Eppie Gibson, MD. It was created on her behalf by Steva Colder, a trained medical scribe. The creation of this record is based on the scribe's personal observations and the provider's statements to them. This document has been checked and approved by the attending provider.

## 2018-04-29 ENCOUNTER — Ambulatory Visit: Payer: Medicare Other | Admitting: Adult Health

## 2018-04-29 ENCOUNTER — Encounter: Payer: Self-pay | Admitting: Adult Health

## 2018-04-29 ENCOUNTER — Telehealth: Payer: Self-pay | Admitting: Adult Health

## 2018-04-29 VITALS — BP 124/58 | HR 72 | Temp 98.2°F | Resp 16 | Ht 61.0 in | Wt 127.0 lb

## 2018-04-29 DIAGNOSIS — E7800 Pure hypercholesterolemia, unspecified: Secondary | ICD-10-CM | POA: Diagnosis not present

## 2018-04-29 DIAGNOSIS — Z0000 Encounter for general adult medical examination without abnormal findings: Secondary | ICD-10-CM | POA: Diagnosis not present

## 2018-04-29 DIAGNOSIS — I4821 Permanent atrial fibrillation: Secondary | ICD-10-CM

## 2018-04-29 DIAGNOSIS — E139 Other specified diabetes mellitus without complications: Secondary | ICD-10-CM

## 2018-04-29 DIAGNOSIS — I10 Essential (primary) hypertension: Secondary | ICD-10-CM

## 2018-04-29 LAB — CBC WITH DIFFERENTIAL/PLATELET
Basophils Absolute: 0.1 10*3/uL (ref 0.0–0.1)
Basophils Relative: 0.9 % (ref 0.0–3.0)
Eosinophils Absolute: 0.1 10*3/uL (ref 0.0–0.7)
Eosinophils Relative: 1.4 % (ref 0.0–5.0)
HCT: 44.3 % (ref 36.0–46.0)
Hemoglobin: 14.4 g/dL (ref 12.0–15.0)
Lymphocytes Relative: 17.5 % (ref 12.0–46.0)
Lymphs Abs: 1.1 10*3/uL (ref 0.7–4.0)
MCHC: 32.5 g/dL (ref 30.0–36.0)
MCV: 100.7 fl — ABNORMAL HIGH (ref 78.0–100.0)
MONOS PCT: 11.3 % (ref 3.0–12.0)
Monocytes Absolute: 0.7 10*3/uL (ref 0.1–1.0)
Neutro Abs: 4.4 10*3/uL (ref 1.4–7.7)
Neutrophils Relative %: 68.9 % (ref 43.0–77.0)
Platelets: 184 10*3/uL (ref 150.0–400.0)
RBC: 4.39 Mil/uL (ref 3.87–5.11)
RDW: 14.5 % (ref 11.5–15.5)
WBC: 6.4 10*3/uL (ref 4.0–10.5)

## 2018-04-29 LAB — HEPATIC FUNCTION PANEL
ALBUMIN: 4.3 g/dL (ref 3.5–5.2)
ALT: 20 U/L (ref 0–35)
AST: 17 U/L (ref 0–37)
Alkaline Phosphatase: 87 U/L (ref 39–117)
Bilirubin, Direct: 0.1 mg/dL (ref 0.0–0.3)
Total Bilirubin: 0.4 mg/dL (ref 0.2–1.2)
Total Protein: 6.5 g/dL (ref 6.0–8.3)

## 2018-04-29 LAB — LIPID PANEL
Cholesterol: 246 mg/dL — ABNORMAL HIGH (ref 0–200)
HDL: 64 mg/dL (ref >39.00)
LDL Cholesterol: 152 mg/dL — ABNORMAL HIGH (ref 0–99)
NonHDL: 181.82
Total CHOL/HDL Ratio: 4
Triglycerides: 148 mg/dL (ref 0.0–149.0)
VLDL: 29.6 mg/dL (ref 0.0–40.0)

## 2018-04-29 LAB — BASIC METABOLIC PANEL
BUN: 22 mg/dL (ref 6–23)
CO2: 24 mEq/L (ref 19–32)
Calcium: 9.5 mg/dL (ref 8.4–10.5)
Chloride: 107 mEq/L (ref 96–112)
Creatinine, Ser: 1.15 mg/dL (ref 0.40–1.20)
GFR: 48.1 mL/min — ABNORMAL LOW (ref >60.00)
Glucose, Bld: 115 mg/dL — ABNORMAL HIGH (ref 70–99)
Potassium: 3.6 mEq/L (ref 3.5–5.1)
SODIUM: 142 meq/L (ref 135–145)

## 2018-04-29 LAB — HEMOGLOBIN A1C: HEMOGLOBIN A1C: 6.9 % — AB (ref 4.6–6.5)

## 2018-04-29 LAB — TSH: TSH: 1.57 u[IU]/mL (ref 0.35–4.50)

## 2018-04-29 NOTE — Telephone Encounter (Signed)
Copied from [place] [id number]. Topic: General - Other >> Apr 29, 2018  3:34 PM Jackey Loge, Lenna Sciara wrote: Reason for CRM: Patient states she is supposed to be giving a urine sample and was not able to today, please advise on what she needs to do.  Best call back is [phone]

## 2018-04-29 NOTE — Progress Notes (Signed)
Subjective:    Patient ID: Adrienne [name], female    DOB: Jun 11, 1934, 83 y.o.   MRN: [id number]  HPI Patient presents for yearly preventative medicine examination. She is a pleasant 83 year old female who  has a past medical history of Allergy, Atrial fibrillation ([place]), Breast cancer ([place]) (08/25/12), Diabetes mellitus, Family history of cancer, Gout, Hearing loss, History of breast cancer, History of frequent urinary tract infections, radiation therapy (11/22/12- 12/19/12), Hyperlipidemia, Hypertension, and Osteopenia.   H/o Breast Cancer - she is happy to report that she has finished her last treatment of radiation on Dec 17th.   Hyperlipidemia - Currently conservative treatment with Omega 3's  Lab Results  Component Value Date   CHOL 225 (H) 03/10/2016   HDL 53.30 03/10/2016   LDLCALC 146 (H) 03/10/2016   LDLDIRECT 124.0 07/22/2006   TRIG 125.0 03/10/2016   CHOLHDL 4 03/10/2016   Hypertension - managed by Cardiology. Currently prescribed Cardizem and Atenolol  BP Readings from Last 3 Encounters:  03/23/18 135/83  03/15/18 (!) 144/89  02/25/18 (!) 134/93   A fib - Managed by Cardiology. Currently rate controlled with Cardizem and Atenolol. On Eliquis   DM- currently prescribed Metformin 500 mg BID  Lab Results  Component Value Date   HGBA1C 7.0 12/30/2016     All immunizations and health maintenance protocols were reviewed with the patient and needed orders were placed.  Appropriate screening laboratory values were ordered for the patient including screening of hyperlipidemia, renal function and hepatic function. If indicated by BPH, a PSA was ordered.  Medication reconciliation,  past medical history, social history, problem list and allergies were reviewed in detail with the patient  Goals were established with regard to weight loss, exercise, and  diet in compliance with medications  End of life planning was discussed. She has an advanced directive and living will.    She has an upcoming diabetic eye exam  Review of Systems  Constitutional: Negative.   HENT: Negative.   Eyes: Negative.   Respiratory: Negative.   Cardiovascular: Negative.   Gastrointestinal: Negative.   Endocrine: Negative.   Genitourinary: Negative.   Musculoskeletal: Negative.   Skin: Negative.   Allergic/Immunologic: Negative.   Neurological: Negative.   Hematological: Negative.   Psychiatric/Behavioral: Negative.    Past Medical History:  Diagnosis Date  . Allergy   . Atrial fibrillation ([place])   . Breast cancer ([place]) 08/25/12   Invasive ductal ca,DCIS  . Diabetes mellitus    type II  . Family history of cancer   . Gout   . Hearing loss   . History of breast cancer   . History of frequent urinary tract infections   . Hx of radiation therapy 11/22/12- 12/19/12   breast 4250 cGy 17 sessions, left breast boost 750 cGy 3 sessions  . Hyperlipidemia   . Hypertension   . Osteopenia     Social History   Socioeconomic History  . Marital status: Married    Spouse name: Not on file  . Number of children: 3  . Years of education: Not on file  . Highest education level: Not on file  Occupational History  . Not on file  Social Needs  . Financial resource strain: Not on file  . Food insecurity:    Worry: Not on file    Inability: Not on file  . Transportation needs:    Medical: No    Non-medical: No  Tobacco Use  . Smoking status: Former Smoker  Packs/day: 0.25    Years: 4.00    Pack years: 1.00    Types: Cigarettes  . Smokeless tobacco: Never Used  . Tobacco comment: Cigarette use was 50 years ago  Substance and Sexual Activity  . Alcohol use: Not Currently  . Drug use: No  . Sexual activity: Never    Comment: menarche age 17, 68st birth age 61, G3, HRT x 7 years?  Lifestyle  . Physical activity:    Days per week: Not on file    Minutes per session: Not on file  . Stress: Not on file  Relationships  . Social connections:    Talks on phone: Not on  file    Gets together: Not on file    Attends religious service: Not on file    Active member of club or organization: Not on file    Attends meetings of clubs or organizations: Not on file    Relationship status: Not on file  . Intimate partner violence:    Fear of current or ex partner: Not on file    Emotionally abused: Not on file    Physically abused: Not on file    Forced sexual activity: Not on file  Other Topics Concern  . Not on file  Social History Narrative   Married - husband in memory care unit    Three children ( One lives at [place] and two in [place]      She likes to read and go to beach     Past Surgical History:  Procedure Laterality Date  . ABDOMINAL HYSTERECTOMY Bilateral 1999   w/b/l salpingo-oopherectomy  . APPENDECTOMY    . BREAST LUMPECTOMY WITH NEEDLE LOCALIZATION AND AXILLARY SENTINEL LYMPH NODE BX Left 09/12/2012   Procedure: LEFT NEEDLE LOCALIZATION BREAST LUMPECTOMY TION AND AXILLARY SENTINEL LYMPH NODE BX;  Surgeon: Edward Jolly, MD;  Location: [facility];  Service: General;  Laterality: Left;  . BREAST LUMPECTOMY WITH RADIOACTIVE SEED AND SENTINEL LYMPH NODE BIOPSY Right 10/26/2017   Procedure: BREAST LUMPECTOMY WITH RADIOACTIVE SEED AND SENTINEL LYMPH NODE BIOPSY;  Surgeon: Excell Seltzer, MD;  Location: [facility];  Service: General;  Laterality: Right;  . BREAST SURGERY  1990's   right- fibroid cyst  . CHOLECYSTECTOMY    . SEPTOPLASTY    . TONSILLECTOMY      Family History  Problem Relation Age of Onset  . Hypertension Mother   . Heart disease Mother        Murmur and irregular heart disease  . Dementia Mother        d. 49  . Hypertension Father   . Aneurysm Father 67  . Breast cancer Cousin 61       mat first cousin    Allergies  Allergen Reactions  . Penicillins Hives    Has patient had a PCN reaction causing immediate rash, facial/tongue/throat swelling, SOB or lightheadedness with hypotension: No Has  patient had a PCN reaction causing severe rash involving mucus membranes or skin necrosis: No Has patient had a PCN reaction that required hospitalization: No Has patient had a PCN reaction occurring within the last 10 years: No If all of the above answers are "NO", then may proceed with Cephalosporin use.  . Other Hives  . Prednisone Other (See Comments)    ELEVATED BLOOD SUGAR  . Radiaplexrx [Woun'Dres Hydrogel Wound Dress] Hives  . Sulfamethoxazole Nausea Only    Current Outpatient Medications on File Prior to Visit  Medication Sig Dispense Refill  .  ACCU-CHEK AVIVA PLUS test strip USE TO TEST TWICE DAILY **ICD-10 CODE E13.9** 200 each 3  . allopurinol (ZYLOPRIM) 300 MG tablet TAKE 1 TABLET BY MOUTH DAILY. 90 tablet 3  . atenolol (TENORMIN) 25 MG tablet TAKE 1 TABLET BY MOUTH EVERY DAY 90 tablet 2  . chlorpheniramine (CHLOR-TRIMETON) 4 MG tablet Take 4 mg 2 (two) times daily as needed by mouth for allergies.    Marland Kitchen diltiazem (CARDIZEM CD) 360 MG 24 hr capsule TAKE 1 CAPSULE (360 MG TOTAL) BY MOUTH DAILY. 90 capsule 3  . ELIQUIS 5 MG TABS tablet TAKE 1 TABLET BY MOUTH TWICE A DAY 180 tablet 1  . fish oil-omega-3 fatty acids 1000 MG capsule Take 1 g by mouth daily.    Marland Kitchen lidocaine-prilocaine (EMLA) cream Apply 1 application topically as needed. 30 g 3  . loperamide (IMODIUM) 2 MG capsule Take 2 mg by mouth as needed for diarrhea or loose stools.    . metFORMIN (GLUCOPHAGE) 500 MG tablet Take 1 tablet (500 mg total) by mouth 2 (two) times daily. 180 tablet 0  . Multiple Vitamin (MULTIVITAMIN) tablet Take 1 tablet by mouth daily.    Marland Kitchen OVER THE COUNTER MEDICATION Take by mouth 2 (two) times daily. Presavision- for macular degeneration prevention    . VITAMIN D, ERGOCALCIFEROL, PO Take by mouth.     No current facility-administered medications on file prior to visit.     BP (!) 124/58 (BP Location: Left Arm, Patient Position: Sitting, Cuff Size: Normal)   Pulse 72   Temp 98.2 F (36.8 C)  (Oral)   Resp 16   Ht 5\' 1"  (1.549 m)   Wt 127 lb (57.6 kg)   SpO2 98%   BMI 24.00 kg/m       Objective:   Physical Exam Vitals signs and nursing note reviewed.  Constitutional:      Appearance: Normal appearance. She is normal weight.  HENT:     Head: Normocephalic and atraumatic.     Right Ear: Tympanic membrane, ear canal and external ear normal.     Left Ear: Tympanic membrane, ear canal and external ear normal. There is no impacted cerumen.     Nose: Nose normal.     Mouth/Throat:     Mouth: Mucous membranes are moist.     Pharynx: Oropharynx is clear.  Eyes:     Extraocular Movements: Extraocular movements intact.     Conjunctiva/sclera: Conjunctivae normal.     Pupils: Pupils are equal, round, and reactive to light.  Neck:     Musculoskeletal: Normal range of motion and neck supple.     Vascular: No carotid bruit.  Cardiovascular:     Rate and Rhythm: Normal rate. Rhythm irregularly irregular.     Pulses: Normal pulses.     Heart sounds: Normal heart sounds. No murmur. No friction rub. No gallop.   Pulmonary:     Effort: Pulmonary effort is normal. No respiratory distress.     Breath sounds: Normal breath sounds. No stridor. No wheezing, rhonchi or rales.  Chest:     Chest wall: No tenderness.  Abdominal:     General: Abdomen is flat. Bowel sounds are normal. There is no distension.     Palpations: Abdomen is soft. There is no mass.     Tenderness: There is no abdominal tenderness. There is no right CVA tenderness, left CVA tenderness, guarding or rebound.     Hernia: No hernia is present.  Musculoskeletal: Normal range of motion.  General: No swelling, tenderness, deformity or signs of injury.     Right lower leg: No edema.     Left lower leg: No edema.  Skin:    General: Skin is warm and dry.     Capillary Refill: Capillary refill takes less than 2 seconds.     Coloration: Skin is not jaundiced or pale.     Findings: No bruising, erythema, lesion or  rash.  Neurological:     General: No focal deficit present.     Mental Status: She is alert. Mental status is at baseline. She is disoriented.  Psychiatric:        Mood and Affect: Mood normal.        Behavior: Behavior normal.        Thought Content: Thought content normal.        Judgment: Judgment normal.       Assessment & Plan:  1. Routine general medical examination at a health care facility  - One year follow up  - Basic metabolic panel - CBC with Differential/Platelet - Hepatic function panel - Lipid panel - TSH - Hemoglobin A1c  2. Diabetes 1.5, managed as type 2 ([place]) - Consider increase in metformin  - Follow up in 3 months  - Basic metabolic panel - CBC with Differential/Platelet - Hepatic function panel - Lipid panel - TSH - Hemoglobin A1c - Microalbumin / creatinine urine ratio  3. Essential hypertension - Well controlled - Basic metabolic panel - CBC with Differential/Platelet - Hepatic function panel - Lipid panel - TSH - Hemoglobin A1c  4. Pure hypercholesterolemia - Consider statin  - Basic metabolic panel - CBC with Differential/Platelet - Hepatic function panel - Lipid panel - TSH - Hemoglobin A1c  5. Permanent atrial fibrillation - Continue with Cardiology plan of care   [name], NP

## 2018-04-29 NOTE — Patient Instructions (Signed)
It was great seeing you today   We will follow up with you regarding your blood work   Let me know if you need anything 

## 2018-05-03 NOTE — Telephone Encounter (Signed)
Spoke to the pt and she will stop by the office to give a sample one day next week.  Nothing further needed.

## 2018-05-03 NOTE — Telephone Encounter (Signed)
She can either bring in a sample or we can collect it the next time she is in   Lab is microalbumin/cr ration

## 2018-05-17 ENCOUNTER — Encounter: Payer: Self-pay | Admitting: Radiation Oncology

## 2018-05-17 NOTE — Progress Notes (Signed)
Ms. Godbee presents for follow up of radiation completed 04/19/18 to her Right Breast. She will see Dr. Burr Medico next on 07/27/2018. Her skin has healed well, there is hyperpigmentation present. She continues to use sonafine daily. She will switch to a vitamin E containing lotion when the sonafine is completed. She tells me her energy level has improved. She is trying to exercise when she is able.   BP (!) 150/87   Pulse 77   Temp 97.7 F (36.5 C) (Oral)   Wt 130 lb (59 kg)   SpO2 95% Comment: room air  BMI 24.56 kg/m    Wt Readings from Last 3 Encounters:  05/20/18 130 lb (59 kg)  04/29/18 127 lb (57.6 kg)  03/23/18 130 lb 1.6 oz (59 kg)

## 2018-05-20 ENCOUNTER — Other Ambulatory Visit: Payer: Self-pay

## 2018-05-20 ENCOUNTER — Encounter: Payer: Self-pay | Admitting: Radiation Oncology

## 2018-05-20 ENCOUNTER — Ambulatory Visit
Admission: RE | Admit: 2018-05-20 | Discharge: 2018-05-20 | Disposition: A | Discharge status: Home / Self Care | Payer: Medicare Other | Source: Ambulatory Visit | Attending: Radiation Oncology | Admitting: Radiation Oncology

## 2018-05-20 DIAGNOSIS — Z7984 Long term (current) use of oral hypoglycemic drugs: Secondary | ICD-10-CM | POA: Insufficient documentation

## 2018-05-20 DIAGNOSIS — Z923 Personal history of irradiation: Secondary | ICD-10-CM | POA: Insufficient documentation

## 2018-05-20 DIAGNOSIS — Z79899 Other long term (current) drug therapy: Secondary | ICD-10-CM | POA: Insufficient documentation

## 2018-05-20 DIAGNOSIS — C50411 Malignant neoplasm of upper-outer quadrant of right female breast: Secondary | ICD-10-CM | POA: Insufficient documentation

## 2018-05-20 DIAGNOSIS — Z171 Estrogen receptor negative status [ER-]: Secondary | ICD-10-CM | POA: Diagnosis not present

## 2018-05-20 DIAGNOSIS — Z7901 Long term (current) use of anticoagulants: Secondary | ICD-10-CM | POA: Diagnosis not present

## 2018-05-20 HISTORY — DX: Personal history of irradiation: Z92.3

## 2018-05-20 NOTE — Progress Notes (Signed)
Radiation Oncology         [phone] ________________________________  Name: Adrienne [name] MRN: [id number]  Date: 05/20/2018  DOB: 1935-02-16  Follow-Up Visit Note  Outpatient  CC: [name], NP  Truitt Merle, MD  Diagnosis and Prior Radiotherapy:    ICD-10-CM   1. Malignant neoplasm of upper-outer quadrant of right breast in female, estrogen receptor negative ([place]) C50.411    Z17.1    CHIEF COMPLAINT: Here for follow-up and surveillance of right breast cancer  Radiation treatment dates:   03/21/2018-04/19/2018  Site/dose:   1. Right breast, 2.67 Gy x 15 fractions for a total dose of 40.05 Gy                       2. Boost, 2 Gy x 5 fractions for a total dose of 10 Gy   Narrative:  The patient returns today for routine follow-up of radiation treatment completed on 04/19/2018 to her right breast. She is still using sonafine as previously advised. She notes that she has had a recent increase in energy levels and she plans to start working out soon. She has been doing well since radiation treatment. Pt states that she obtains mammograms twice a year.             She has a follow up appointment with Dr. Burr Medico on 07/27/2018.   ALLERGIES:  is allergic to penicillins; other; prednisone; radiaplexrx [woun'dres hydrogel wound dress]; and sulfamethoxazole.  Meds: Current Outpatient Medications  Medication Sig Dispense Refill  . ACCU-CHEK AVIVA PLUS test strip USE TO TEST TWICE DAILY **ICD-10 CODE E13.9** 200 each 3  . allopurinol (ZYLOPRIM) 300 MG tablet TAKE 1 TABLET BY MOUTH DAILY. 90 tablet 3  . atenolol (TENORMIN) 25 MG tablet TAKE 1 TABLET BY MOUTH EVERY DAY 90 tablet 2  . chlorpheniramine (CHLOR-TRIMETON) 4 MG tablet Take 4 mg 2 (two) times daily as needed by mouth for allergies.    Marland Kitchen diltiazem (CARDIZEM CD) 360 MG 24 hr capsule TAKE 1 CAPSULE (360 MG TOTAL) BY MOUTH DAILY. 90 capsule 3  . ELIQUIS 5 MG TABS tablet TAKE 1 TABLET BY MOUTH TWICE A DAY 180 tablet 1  . fish  oil-omega-3 fatty acids 1000 MG capsule Take 1 g by mouth daily.    Marland Kitchen loperamide (IMODIUM) 2 MG capsule Take 2 mg by mouth as needed for diarrhea or loose stools.    . metFORMIN (GLUCOPHAGE) 500 MG tablet Take 1 tablet (500 mg total) by mouth 2 (two) times daily. 180 tablet 0  . Multiple Vitamin (MULTIVITAMIN) tablet Take 1 tablet by mouth daily.    Marland Kitchen OVER THE COUNTER MEDICATION Take by mouth 2 (two) times daily. Presavision- for macular degeneration prevention    . VITAMIN D, ERGOCALCIFEROL, PO Take by mouth.    . lidocaine-prilocaine (EMLA) cream Apply 1 application topically as needed. (Patient not taking: Reported on 05/20/2018) 30 g 3   No current facility-administered medications for this encounter.     Physical Findings: The patient is in no acute distress. Patient is alert and oriented.  weight is 130 lb (59 kg). Her oral temperature is 97.7 F (36.5 C). Her blood pressure is 150/87 (abnormal) and her pulse is 77. Her oxygen saturation is 95%. .    Satisfactory skin healing in radiotherapy fields.  Little bit of residual hyperpigmentation that is mild over right breast, skin is intact.    Lab Findings: Lab Results  Component Value Date   WBC  6.4 04/29/2018   HGB 14.4 04/29/2018   HCT 44.3 04/29/2018   MCV 100.7 (H) 04/29/2018   PLT 184.0 04/29/2018    Radiographic Findings: No results found.  Impression/Plan: Healing well from radiotherapy to the breast tissue.  Continue skin care with topical Vitamin E Oil and / or lotion for at least 2 more months for further healing.  I encouraged her to continue with yearly mammography and followup with medical oncology. I will see her back on an as-needed basis. I have encouraged her to call if she has any issues or concerns in the future. I wished her the very best.   Eppie Gibson, MD  This document serves as a record of services personally performed by Eppie Gibson, MD. It was created on her behalf by Steva Colder, a trained  medical scribe. The creation of this record is based on the scribe's personal observations and the provider's statements to them. This document has been checked and approved by the attending provider.

## 2018-05-30 ENCOUNTER — Encounter: Payer: Self-pay | Admitting: Family Medicine

## 2018-05-30 ENCOUNTER — Ambulatory Visit: Payer: Medicare Other | Admitting: Family Medicine

## 2018-05-30 VITALS — BP 122/84 | HR 69 | Temp 98.1°F | Resp 16 | Ht 61.0 in | Wt 131.0 lb

## 2018-05-30 DIAGNOSIS — R350 Frequency of micturition: Secondary | ICD-10-CM | POA: Diagnosis not present

## 2018-05-30 DIAGNOSIS — N390 Urinary tract infection, site not specified: Secondary | ICD-10-CM | POA: Diagnosis not present

## 2018-05-30 DIAGNOSIS — R300 Dysuria: Secondary | ICD-10-CM | POA: Diagnosis not present

## 2018-05-30 LAB — POCT URINALYSIS DIPSTICK
BILIRUBIN UA: NEGATIVE
Glucose, UA: NEGATIVE
Ketones, UA: NEGATIVE
ODOR: POSITIVE
Protein, UA: POSITIVE — AB
Spec Grav, UA: 1.02 (ref 1.010–1.025)
UROBILINOGEN UA: 0.2 U/dL
pH, UA: 6 (ref 5.0–8.0)

## 2018-05-30 MED ORDER — CEPHALEXIN 500 MG PO CAPS: 500.0000 mg | ORAL_CAPSULE | Freq: Three times a day (TID) | ORAL | 0 refills | Status: AC

## 2018-05-30 NOTE — Patient Instructions (Addendum)
  [name] I have seen you today for an acute visit.  A few things to remember from today's visit:   Frequent urination - Plan: POC Urinalysis Dipstick  Burning with urination - Plan: POC Urinalysis Dipstick, Urine culture  Urinary tract infection without hematuria, site unspecified - Plan: Urine culture, cephALEXin (KEFLEX) 500 MG capsule   Adequate fluid intake, avoid holding urine for long hours, and over the counter Vit C OR cranberry capsules might help.  Today we will treat empirically with antibiotic, which we might need to change when urine culture comes back depending of bacteria susceptibility.  Seek immediate medical attention if severe abdominal pain, vomiting, fever/chills, or worsening symptoms.   In general please monitor for signs of worsening symptoms and seek immediate medical attention if any concerning.    I hope you get better soon!

## 2018-05-30 NOTE — Progress Notes (Signed)
HPI:  Chief Complaint  Patient presents with  . Urinary Frequency    sx started Saturday  . Burning with urination    Adrienne [name] is a 84 y.o. female with history of atrial fibrillation and breast cancer, recently completed chemo and radiation therapy is here today complaining of 2-3 of urinary symptoms.  Dysuria: Yes Urinary frequency: Yes Urinary urgency: Negative Incontinence: No more than usual.  Problem worsened after chemotherapy for breast cancer, she wears a pad. Gross hematuria: Negative She has not identified exacerbating or alleviating factors.  She has not noted fever, chills, change in appetite, or unusual fatigue.  Abdominal pain: Suprapubic, constant, dull pain.  Pain is not radiated.  She has not identified exacerbating or alleviating factors. She has not taking OTC analgesic.   Nausea or vomiting: Denies  Abnormal vaginal bleeding or discharge: Denies History of vaginal atrophy.   Postmenopausal. Sexual activity: Negative. Hx of UTI: Last time she was treated for UTI was in 2018, Cipro. She is allergic to sulfa. Ucx in 10/2016 grew organisms commonly found on external and internal genitalia.  OTC medications for this problem: None. She started drinking cranberry juice.    Review of Systems  Constitutional: Negative for activity change, appetite change, fatigue and fever.  Respiratory: Negative for shortness of breath.   Cardiovascular: Negative for leg swelling.  Gastrointestinal: Negative for abdominal pain, nausea and vomiting.       No changes in bowel habits.  Genitourinary: Positive for dysuria, frequency and urgency. Negative for decreased urine volume, hematuria, vaginal bleeding and vaginal discharge.  Musculoskeletal: Negative for back pain and myalgias.  Neurological: Negative for weakness, light-headedness and headaches.    Current Outpatient Medications on File Prior to Visit  Medication Sig Dispense Refill  . ACCU-CHEK  AVIVA PLUS test strip USE TO TEST TWICE DAILY **ICD-10 CODE E13.9** 200 each 3  . allopurinol (ZYLOPRIM) 300 MG tablet TAKE 1 TABLET BY MOUTH DAILY. 90 tablet 3  . atenolol (TENORMIN) 25 MG tablet TAKE 1 TABLET BY MOUTH EVERY DAY 90 tablet 2  . chlorpheniramine (CHLOR-TRIMETON) 4 MG tablet Take 4 mg 2 (two) times daily as needed by mouth for allergies.    Marland Kitchen diltiazem (CARDIZEM CD) 360 MG 24 hr capsule TAKE 1 CAPSULE (360 MG TOTAL) BY MOUTH DAILY. 90 capsule 3  . ELIQUIS 5 MG TABS tablet TAKE 1 TABLET BY MOUTH TWICE A DAY 180 tablet 1  . fish oil-omega-3 fatty acids 1000 MG capsule Take 1 g by mouth daily.    Marland Kitchen lidocaine-prilocaine (EMLA) cream Apply 1 application topically as needed. 30 g 3  . loperamide (IMODIUM) 2 MG capsule Take 2 mg by mouth as needed for diarrhea or loose stools.    . metFORMIN (GLUCOPHAGE) 500 MG tablet Take 1 tablet (500 mg total) by mouth 2 (two) times daily. 180 tablet 0  . Multiple Vitamin (MULTIVITAMIN) tablet Take 1 tablet by mouth daily.    Marland Kitchen OVER THE COUNTER MEDICATION Take by mouth 2 (two) times daily. Presavision- for macular degeneration prevention    . TOBREX 0.3 % ophthalmic ointment APPLY A SMALL AMOUNT ON EYELID THREE TIMES A DAY AS NEEDED    . VITAMIN D, ERGOCALCIFEROL, PO Take by mouth.     No current facility-administered medications on file prior to visit.      Past Medical History:  Diagnosis Date  . Allergy   . Atrial fibrillation ([place])   . Breast cancer ([place]) 08/25/12  Invasive ductal ca,DCIS  . Diabetes mellitus    type II  . Family history of cancer   . Gout   . Hearing loss   . History of breast cancer   . History of frequent urinary tract infections   . History of radiation therapy 03/21/18- 04/19/18   Right Breast 2.67 Gy X 15 fractions, right breast boost 2 Gy X 5 fractions.   Marland Kitchen Hx of radiation therapy 11/22/12- 12/19/12   breast 4250 cGy 17 sessions, left breast boost 750 cGy 3 sessions  . Hyperlipidemia   . Hypertension   .  Osteopenia    Allergies  Allergen Reactions  . Penicillins Hives    Has patient had a PCN reaction causing immediate rash, facial/tongue/throat swelling, SOB or lightheadedness with hypotension: No Has patient had a PCN reaction causing severe rash involving mucus membranes or skin necrosis: No Has patient had a PCN reaction that required hospitalization: No Has patient had a PCN reaction occurring within the last 10 years: No If all of the above answers are "NO", then may proceed with Cephalosporin use.  . Other Hives  . Prednisone Other (See Comments)    ELEVATED BLOOD SUGAR  . Radiaplexrx [Woun'Dres Hydrogel Wound Dress] Hives  . Sulfamethoxazole Nausea Only    Social History   Socioeconomic History  . Marital status: Married    Spouse name: Not on file  . Number of children: 3  . Years of education: Not on file  . Highest education level: Not on file  Occupational History  . Not on file  Social Needs  . Financial resource strain: Not on file  . Food insecurity:    Worry: Not on file    Inability: Not on file  . Transportation needs:    Medical: No    Non-medical: No  Tobacco Use  . Smoking status: Former Smoker    Packs/day: 0.25    Years: 4.00    Pack years: 1.00    Types: Cigarettes  . Smokeless tobacco: Never Used  . Tobacco comment: Cigarette use was 50 years ago  Substance and Sexual Activity  . Alcohol use: Not Currently  . Drug use: No  . Sexual activity: Never    Comment: menarche age 42, 22st birth age 73, G64, HRT x 7 years?  Lifestyle  . Physical activity:    Days per week: Not on file    Minutes per session: Not on file  . Stress: Not on file  Relationships  . Social connections:    Talks on phone: Not on file    Gets together: Not on file    Attends religious service: Not on file    Active member of club or organization: Not on file    Attends meetings of clubs or organizations: Not on file    Relationship status: Not on file  Other Topics  Concern  . Not on file  Social History Narrative   Married - husband in memory care unit    Three children ( One lives at [place] and two in [place]      She likes to read and go to beach     Vitals:   05/30/18 1606  BP: 122/84  Pulse: 69  Resp: 16  Temp: 98.1 F (36.7 C)  SpO2: 94%   Body mass index is 24.75 kg/m.   Physical Exam  Nursing note and vitals reviewed. Constitutional: She is oriented to person, place, and time. She appears well-developed and well-nourished. No distress.  HENT:  Head: Normocephalic and atraumatic.  Mouth/Throat: Mucous membranes are normal.  Eyes: Conjunctivae are normal.  Cardiovascular: Normal rate. An irregularly irregular rhythm present.  No murmur heard. Respiratory: Effort normal and breath sounds normal. No respiratory distress.  GI: Soft. She exhibits no distension and no mass. There is abdominal tenderness in the suprapubic area. There is no rebound, no guarding and no CVA tenderness.  Musculoskeletal:        General: No edema.  Lymphadenopathy:    She has no cervical adenopathy.  Neurological: She is alert and oriented to person, place, and time. She has normal strength.  Mildly unstavble gait with no assistance.  Skin: Skin is warm. No erythema.  Psychiatric: Her mood appears anxious.  Good eye contact,well groomed.    ASSESSMENT AND PLAN:  Ms.Celest was seen today for urinary frequency and burning with urination.  Diagnoses and all orders for this visit:  Frequent urination -     POC Urinalysis Dipstick  Dysuria Possible etiologies discussed,including vaginal atrophy. Urine dip abnormal. Urine sent for Cx.  -     POC Urinalysis Dipstick -     Urine culture  Urinary tract infection without hematuria, site unspecified  Empiric abx treatment started today and will be tailored according to Ucx results and susceptibility report. She has taken Cipro before and tolerated well but because Hx of a fib I prefer to  avoid Quinolones. Cephalexin side effects discussed as well as the risk of cross allergic Rx with PNC. Clearly instructed about warning signs. F/U in 1 week,before is needed.  -     Urine culture -     cephALEXin (KEFLEX) 500 MG capsule; Take 1 capsule (500 mg total) by mouth 3 (three) times daily for 7 days.     Return in 1 week (on 06/06/2018) for PCP.       Marquize Seib G. Martinique, MD  [facility]. [place] office.

## 2018-05-31 LAB — URINE CULTURE
MICRO NUMBER:: 108975
SPECIMEN QUALITY: ADEQUATE

## 2018-06-07 ENCOUNTER — Ambulatory Visit: Payer: Medicare Other | Admitting: Adult Health

## 2018-06-07 ENCOUNTER — Encounter: Payer: Self-pay | Admitting: Adult Health

## 2018-06-07 VITALS — BP 110/58 | Temp 97.5°F | Wt 129.0 lb

## 2018-06-07 DIAGNOSIS — R350 Frequency of micturition: Secondary | ICD-10-CM | POA: Diagnosis not present

## 2018-06-07 NOTE — Progress Notes (Signed)
Subjective:    Patient ID: Adrienne [name], female    DOB: 1935-01-29, 84 y.o.   MRN: [id number]  HPI 84 year old female who  has a past medical history of Allergy, Atrial fibrillation ([place]), Breast cancer ([place]) (08/25/12), Diabetes mellitus, Family history of cancer, Gout, Hearing loss, History of breast cancer, History of frequent urinary tract infections, History of radiation therapy (03/21/18- 04/19/18), radiation therapy (11/22/12- 12/19/12), Hyperlipidemia, Hypertension, and Osteopenia.  She was seen by Dr. Martinique last week for suspected UTI. She was treated empirically for UTI with Keflex. Her urine culture came back negative. She reports that her symptoms have resolved     Review of Systems See HPI   Past Medical History:  Diagnosis Date  . Allergy   . Atrial fibrillation ([place])   . Breast cancer ([place]) 08/25/12   Invasive ductal ca,DCIS  . Diabetes mellitus    type II  . Family history of cancer   . Gout   . Hearing loss   . History of breast cancer   . History of frequent urinary tract infections   . History of radiation therapy 03/21/18- 04/19/18   Right Breast 2.67 Gy X 15 fractions, right breast boost 2 Gy X 5 fractions.   Marland Kitchen Hx of radiation therapy 11/22/12- 12/19/12   breast 4250 cGy 17 sessions, left breast boost 750 cGy 3 sessions  . Hyperlipidemia   . Hypertension   . Osteopenia     Social History   Socioeconomic History  . Marital status: Married    Spouse name: Not on file  . Number of children: 3  . Years of education: Not on file  . Highest education level: Not on file  Occupational History  . Not on file  Social Needs  . Financial resource strain: Not on file  . Food insecurity:    Worry: Not on file    Inability: Not on file  . Transportation needs:    Medical: No    Non-medical: No  Tobacco Use  . Smoking status: Former Smoker    Packs/day: 0.25    Years: 4.00    Pack years: 1.00    Types: Cigarettes  . Smokeless tobacco: Never Used  .  Tobacco comment: Cigarette use was 50 years ago  Substance and Sexual Activity  . Alcohol use: Not Currently  . Drug use: No  . Sexual activity: Never    Comment: menarche age 5, 1st birth age 56, G54, HRT x 7 years?  Lifestyle  . Physical activity:    Days per week: Not on file    Minutes per session: Not on file  . Stress: Not on file  Relationships  . Social connections:    Talks on phone: Not on file    Gets together: Not on file    Attends religious service: Not on file    Active member of club or organization: Not on file    Attends meetings of clubs or organizations: Not on file    Relationship status: Not on file  . Intimate partner violence:    Fear of current or ex partner: No    Emotionally abused: No    Physically abused: No    Forced sexual activity: No  Other Topics Concern  . Not on file  Social History Narrative   Married - husband in memory care unit    Three children ( One lives at [place] and two in [place]      She likes to  read and go to beach     Past Surgical History:  Procedure Laterality Date  . ABDOMINAL HYSTERECTOMY Bilateral 1999   w/b/l salpingo-oopherectomy  . APPENDECTOMY    . BREAST LUMPECTOMY WITH NEEDLE LOCALIZATION AND AXILLARY SENTINEL LYMPH NODE BX Left 09/12/2012   Procedure: LEFT NEEDLE LOCALIZATION BREAST LUMPECTOMY TION AND AXILLARY SENTINEL LYMPH NODE BX;  Surgeon: Edward Jolly, MD;  Location: [facility];  Service: General;  Laterality: Left;  . BREAST LUMPECTOMY WITH RADIOACTIVE SEED AND SENTINEL LYMPH NODE BIOPSY Right 10/26/2017   Procedure: BREAST LUMPECTOMY WITH RADIOACTIVE SEED AND SENTINEL LYMPH NODE BIOPSY;  Surgeon: Excell Seltzer, MD;  Location: [facility];  Service: General;  Laterality: Right;  . BREAST SURGERY  1990's   right- fibroid cyst  . CHOLECYSTECTOMY    . SEPTOPLASTY    . TONSILLECTOMY      Family History  Problem Relation Age of Onset  . Hypertension Mother   . Heart disease  Mother        Murmur and irregular heart disease  . Dementia Mother        d. 31  . Hypertension Father   . Aneurysm Father 63  . Breast cancer Cousin 88       mat first cousin    Allergies  Allergen Reactions  . Penicillins Hives    Has patient had a PCN reaction causing immediate rash, facial/tongue/throat swelling, SOB or lightheadedness with hypotension: No Has patient had a PCN reaction causing severe rash involving mucus membranes or skin necrosis: No Has patient had a PCN reaction that required hospitalization: No Has patient had a PCN reaction occurring within the last 10 years: No If all of the above answers are "NO", then may proceed with Cephalosporin use.  . Other Hives  . Prednisone Other (See Comments)    ELEVATED BLOOD SUGAR  . Radiaplexrx [Woun'Dres Hydrogel Wound Dress] Hives  . Sulfamethoxazole Nausea Only    Current Outpatient Medications on File Prior to Visit  Medication Sig Dispense Refill  . ACCU-CHEK AVIVA PLUS test strip USE TO TEST TWICE DAILY **ICD-10 CODE E13.9** 200 each 3  . allopurinol (ZYLOPRIM) 300 MG tablet TAKE 1 TABLET BY MOUTH DAILY. 90 tablet 3  . atenolol (TENORMIN) 25 MG tablet TAKE 1 TABLET BY MOUTH EVERY DAY 90 tablet 2  . chlorpheniramine (CHLOR-TRIMETON) 4 MG tablet Take 4 mg 2 (two) times daily as needed by mouth for allergies.    Marland Kitchen diltiazem (CARDIZEM CD) 360 MG 24 hr capsule TAKE 1 CAPSULE (360 MG TOTAL) BY MOUTH DAILY. 90 capsule 3  . ELIQUIS 5 MG TABS tablet TAKE 1 TABLET BY MOUTH TWICE A DAY 180 tablet 1  . fish oil-omega-3 fatty acids 1000 MG capsule Take 1 g by mouth daily.    Marland Kitchen lidocaine-prilocaine (EMLA) cream Apply 1 application topically as needed. 30 g 3  . loperamide (IMODIUM) 2 MG capsule Take 2 mg by mouth as needed for diarrhea or loose stools.    . metFORMIN (GLUCOPHAGE) 500 MG tablet Take 1 tablet (500 mg total) by mouth 2 (two) times daily. 180 tablet 0  . Multiple Vitamin (MULTIVITAMIN) tablet Take 1 tablet by mouth  daily.    Marland Kitchen OVER THE COUNTER MEDICATION Take by mouth 2 (two) times daily. Presavision- for macular degeneration prevention    . TOBREX 0.3 % ophthalmic ointment APPLY A SMALL AMOUNT ON EYELID THREE TIMES A DAY AS NEEDED    . VITAMIN D, ERGOCALCIFEROL, PO Take by mouth.  No current facility-administered medications on file prior to visit.     BP (!) 110/58   Temp (!) 97.5 F (36.4 C)   Wt 129 lb (58.5 kg)   BMI 24.37 kg/m       Objective:   Physical Exam Vitals signs and nursing note reviewed.  Constitutional:      Appearance: Normal appearance.  Cardiovascular:     Rate and Rhythm: Normal rate and regular rhythm.     Pulses: Normal pulses.     Heart sounds: Normal heart sounds.  Pulmonary:     Effort: Pulmonary effort is normal.     Breath sounds: Normal breath sounds.  Abdominal:     General: Bowel sounds are normal.  Skin:    General: Skin is warm and dry.     Capillary Refill: Capillary refill takes less than 2 seconds.  Neurological:     Mental Status: She is oriented to person, place, and time.       Assessment & Plan:  1. Urinary frequency - Symptoms resolved  - Stay hydrated  - Follow up as needed  [name]

## 2018-06-17 ENCOUNTER — Other Ambulatory Visit: Payer: Self-pay | Admitting: Adult Health

## 2018-06-17 DIAGNOSIS — E139 Other specified diabetes mellitus without complications: Secondary | ICD-10-CM

## 2018-06-17 NOTE — Telephone Encounter (Signed)
Sent to the pharmacy by e-scribe.  Reminder sent to call pt for follow up on 07/30/2018.

## 2018-06-24 ENCOUNTER — Telehealth: Payer: Self-pay | Admitting: Hematology

## 2018-06-24 NOTE — Telephone Encounter (Signed)
AM CME 3/25 moved appointments to PM. Left message. Schedule mailed.

## 2018-07-21 ENCOUNTER — Telehealth: Payer: Self-pay | Admitting: Cardiology

## 2018-07-21 DIAGNOSIS — I480 Paroxysmal atrial fibrillation: Secondary | ICD-10-CM

## 2018-07-21 DIAGNOSIS — I4821 Permanent atrial fibrillation: Secondary | ICD-10-CM

## 2018-07-21 MED ORDER — APIXABAN 5 MG PO TABS: 5.0000 mg | ORAL_TABLET | Freq: Two times a day (BID) | ORAL | 0 refills | Status: DC

## 2018-07-21 NOTE — Telephone Encounter (Signed)
Please review for refill, Thanks !  

## 2018-07-21 NOTE — Telephone Encounter (Signed)
New message    *STAT* If patient is at the pharmacy, call can be transferred to refill team.   1. Which medications need to be refilled? (please list name of each medication and dose if known) ELIQUIS 5 MG TABS tablet  2. Which pharmacy/location (including street and city if local pharmacy) is medication to be sent to?[facility], [place] - [address]  3. Do they need a 30 day or 90 day supply? [place]

## 2018-07-27 ENCOUNTER — Other Ambulatory Visit: Payer: Medicare Other

## 2018-07-27 ENCOUNTER — Ambulatory Visit: Payer: Medicare Other | Admitting: Hematology

## 2018-08-04 ENCOUNTER — Telehealth: Payer: Self-pay | Admitting: Family Medicine

## 2018-08-04 NOTE — Telephone Encounter (Signed)
-----   Message from Hulda Humphrey, [place] sent at 06/17/2018  3:19 PM EST ----- Pt needs A1C follow up

## 2018-08-04 NOTE — Telephone Encounter (Signed)
Lab work and [employer] needed or ok to wait 3 months?

## 2018-08-04 NOTE — Telephone Encounter (Signed)
Refill not due at this time.  Will send when needed.

## 2018-08-04 NOTE — Telephone Encounter (Signed)
A1c controlled. I am ok with extending 90 days to keep her from coming out into the community

## 2018-09-12 ENCOUNTER — Other Ambulatory Visit: Payer: Self-pay | Admitting: Adult Health

## 2018-09-12 DIAGNOSIS — E139 Other specified diabetes mellitus without complications: Secondary | ICD-10-CM

## 2018-09-13 ENCOUNTER — Telehealth: Payer: Self-pay | Admitting: Hematology

## 2018-09-13 NOTE — Telephone Encounter (Signed)
Sent to the pharmacy by e-scribe. 

## 2018-09-13 NOTE — Telephone Encounter (Signed)
Called and spoke with patient to see if she would like to change 5/18 to phone visit per sch msg. Patient would rather postpone. Confirmed new date and time

## 2018-09-13 NOTE — Telephone Encounter (Signed)
Ok to refill for 90 days  

## 2018-09-18 NOTE — Progress Notes (Signed)
Virtual Visit via Telephone Note   This visit type was conducted due to national recommendations for restrictions regarding the COVID-19 Pandemic (e.g. social distancing) in an effort to limit this patient's exposure and mitigate transmission in our community.  Due to her co-morbid illnesses, this patient is at least at moderate risk for complications without adequate follow up.  This format is felt to be most appropriate for this patient at this time.  The patient did not have access to video technology/had technical difficulties with video requiring transitioning to audio format only (telephone).  All issues noted in this document were discussed and addressed.  No physical exam could be performed with this format.  Please refer to the patient's chart for her  consent to telehealth for [facility].   Date:  09/18/2018   ID:  Adrienne [name], DOB 02-01-1935, MRN [id number]  Patient Location: Home Provider Location: Home  PCP:  [name], NP  Cardiologist:  Kirk Ruths, MD  Electrophysiologist:  None   Evaluation Performed:  Follow-Up Visit  Chief Complaint:  Afib and Hypertension   History of Present Illness:    Adrienne [name] is a 84 y.o. female we are seeing for ongoing assessment and management of atrial fibrillation via telemedicine visit.  The patient's most recent Holter monitor in September 2017 revealed atrial fib rate was controlled, she remains on apixaban 5 mg twice daily due to CHADS VASC Score of 4 (age, gender, DM, HTN).  She was last evaluated by Dr. Stanford Breed on 06/24/2017 in the office and was stable from a cardiac standpoint.  She was to continue her current medication regimen with hyperlipidemia management by primary care.  She is without complaints. She walks 2-21/2 miles a day, remains active. She feels cooped up with the pandemic restrictions and has not left the [place] since March.  She is medically compliant. She has follow up appointments with  her PCP, dentist and for mammogram next month. She has not had labs drawn.   The patient does not have symptoms concerning for COVID-19 infection (fever, chills, cough, or new shortness of breath).    Past Medical History:  Diagnosis Date  . Allergy   . Atrial fibrillation ([place])   . Breast cancer ([place]) 08/25/12   Invasive ductal ca,DCIS  . Diabetes mellitus    type II  . Family history of cancer   . Gout   . Hearing loss   . History of breast cancer   . History of frequent urinary tract infections   . History of radiation therapy 03/21/18- 04/19/18   Right Breast 2.67 Gy X 15 fractions, right breast boost 2 Gy X 5 fractions.   Marland Kitchen Hx of radiation therapy 11/22/12- 12/19/12   breast 4250 cGy 17 sessions, left breast boost 750 cGy 3 sessions  . Hyperlipidemia   . Hypertension   . Osteopenia    Past Surgical History:  Procedure Laterality Date  . ABDOMINAL HYSTERECTOMY Bilateral 1999   w/b/l salpingo-oopherectomy  . APPENDECTOMY    . BREAST LUMPECTOMY WITH NEEDLE LOCALIZATION AND AXILLARY SENTINEL LYMPH NODE BX Left 09/12/2012   Procedure: LEFT NEEDLE LOCALIZATION BREAST LUMPECTOMY TION AND AXILLARY SENTINEL LYMPH NODE BX;  Surgeon: Edward Jolly, MD;  Location: [facility];  Service: General;  Laterality: Left;  . BREAST LUMPECTOMY WITH RADIOACTIVE SEED AND SENTINEL LYMPH NODE BIOPSY Right 10/26/2017   Procedure: BREAST LUMPECTOMY WITH RADIOACTIVE SEED AND SENTINEL LYMPH NODE BIOPSY;  Surgeon: Excell Seltzer, MD;  Location: [facility];  Service: General;  Laterality: Right;  . BREAST SURGERY  1990's   right- fibroid cyst  . CHOLECYSTECTOMY    . SEPTOPLASTY    . TONSILLECTOMY       No outpatient medications have been marked as taking for the 09/20/18 encounter (Appointment) with Lendon Colonel, NP.     Allergies:   Penicillins; Other; Prednisone; Radiaplexrx [woun'dres hydrogel wound dress]; and Sulfamethoxazole   Social History   Tobacco Use  . Smoking  status: Former Smoker    Packs/day: 0.25    Years: 4.00    Pack years: 1.00    Types: Cigarettes  . Smokeless tobacco: Never Used  . Tobacco comment: Cigarette use was 50 years ago  Substance Use Topics  . Alcohol use: Not Currently  . Drug use: No     Family Hx: The patient's family history includes Aneurysm (age of onset: 76) in her father; Breast cancer (age of onset: 57) in her cousin; Dementia in her mother; [name]; [name]  ROS:   Please see the history of present illness.    All other systems reviewed and are negative.   Prior CV studies:   The following studies were reviewed today:  Echocardiogram 04/19/2019 Left ventricle: The cavity size was normal. Wall thickness was   increased in a pattern of mild LVH. Systolic function was mildly   reduced. The estimated ejection fraction was in the range of 45%   to 50%. Diffuse hypokinesis. - Mitral valve: Calcified annulus. Mildly thickened leaflets .   There was mild regurgitation. - Left atrium: The atrium was mildly dilated. - Right atrium: The atrium was mildly dilated.  Holter Monitor 01/14/2016 Study Highlights   Afib with pvcs or aberrantly conducted beats; rate controlled Kirk Ruths    Labs/Other Tests and Data Reviewed:    EKG:  No ECG reviewed.  Recent Labs: 04/29/2018: ALT 20; BUN 22; Creatinine, Ser 1.15; Hemoglobin 14.4; Platelets 184.0; Potassium 3.6; Sodium 142; TSH 1.57   Recent Lipid Panel Lab Results  Component Value Date/Time   CHOL 246 (H) 04/29/2018 01:29 PM   TRIG 148.0 04/29/2018 01:29 PM   TRIG 115 04/08/2006 12:00 AM   HDL 64.00 04/29/2018 01:29 PM   CHOLHDL 4 04/29/2018 01:29 PM   LDLCALC 152 (H) 04/29/2018 01:29 PM   LDLDIRECT 124.0 07/22/2006 09:44 AM    Wt Readings from Last 3 Encounters:  06/07/18 129 lb (58.5 kg)  05/30/18 131 lb (59.4 kg)  05/20/18 130 lb (59 kg)     Objective:    Vital Signs:  There were no vitals  taken for this visit.   VITAL SIGNS:  reviewed She is awake alert and oriented Normal respirations when speaking Asks and Answers questions appropriately.   ASSESSMENT & PLAN:    1. Atrial fibrillation: Rate is controlled on atenolol,diltiazem, and has no bleeding issues on Eliquis.  I will order BMET, CBC today. She will come to the office to have the labs drawn.   2. Hypertension: Currently well controlled No changes in regimen  3. Diabetes; Followed by PCP. She would like Hgb A1c drawn as long as labs are being ordered so that she can give to PCP on appointment.   COVID-19 Education: The signs and symptoms of COVID-19 were discussed with the patient and how to seek care for testing (follow up with PCP or arrange E-visit). The importance of social distancing was discussed today.  Time:  Today, I have spent 10 minutes with the patient with telehealth technology discussing the above problems.     Medication Adjustments/Labs and Tests Ordered: Current medicines are reviewed at length with the patient today.  Concerns regarding medicines are outlined above.   Tests Ordered: No orders of the defined types were placed in this encounter.   Medication Changes: No orders of the defined types were placed in this encounter.   Disposition:  Follow up 6 months   Signed, Phill Myron. [place] Pugh, ANP, AACC  09/18/2018 3:53 PM    [facility]

## 2018-09-19 ENCOUNTER — Ambulatory Visit: Payer: Medicare Other | Admitting: Hematology

## 2018-09-19 ENCOUNTER — Telehealth: Payer: Self-pay | Admitting: Adult Health

## 2018-09-19 ENCOUNTER — Other Ambulatory Visit: Payer: Medicare Other

## 2018-09-19 NOTE — Telephone Encounter (Signed)
Mychart sent via email, no smartphone, consent, pre reg complete 09/19/18 AF

## 2018-09-20 ENCOUNTER — Telehealth (INDEPENDENT_AMBULATORY_CARE_PROVIDER_SITE_OTHER): Payer: Medicare Other | Admitting: Adult Health

## 2018-09-20 ENCOUNTER — Encounter: Payer: Self-pay | Admitting: Adult Health

## 2018-09-20 DIAGNOSIS — I480 Paroxysmal atrial fibrillation: Secondary | ICD-10-CM | POA: Diagnosis not present

## 2018-09-20 DIAGNOSIS — I4821 Permanent atrial fibrillation: Secondary | ICD-10-CM | POA: Diagnosis not present

## 2018-09-20 MED ORDER — DILTIAZEM HCL ER COATED BEADS 360 MG PO CP24: 360.0000 mg | ORAL_CAPSULE | Freq: Every day | ORAL | 1 refills | Status: DC

## 2018-09-20 MED ORDER — ATENOLOL 25 MG PO TABS: 25.0000 mg | ORAL_TABLET | Freq: Every day | ORAL | 1 refills | Status: DC

## 2018-09-20 MED ORDER — APIXABAN 5 MG PO TABS: 5.0000 mg | ORAL_TABLET | Freq: Two times a day (BID) | ORAL | 1 refills | Status: DC

## 2018-09-20 NOTE — Patient Instructions (Signed)
Medication Instructions:  Your physician recommends that you continue on your current medications as directed. Please refer to the Current Medication list given to you today.  If you need a refill on your cardiac medications before your next appointment, please call your pharmacy.   Lab work: Your physician recommends that you return for lab work at your earliest convenience: BMET, CBC, Hemoglobin A1C  If you have labs (blood work) drawn today and your tests are completely normal, you will receive your results only by: Marland Kitchen MyChart Message (if you have MyChart) OR . A paper copy in the mail If you have any lab test that is abnormal or we need to change your treatment, we will call you to review the results.  Follow-Up: At [facility], you and your health needs are our priority.  As part of our continuing mission to provide you with exceptional heart care, we have created designated Provider Care Teams.  These Care Teams include your primary Cardiologist (physician) and Advanced Practice Providers (APPs -  Physician Assistants and Nurse Practitioners) who all work together to provide you with the care you need, when you need it. You will need a follow up appointment in 6 months.  Please call our office 2 months in advance to schedule this appointment.  You may see Kirk Ruths, MD or one of the following Advanced Practice Providers on your designated Care Team:   Kerin Ransom, PA-C Roby Lofts, [place] . Sande Rives, PA-C  Any Other Special Instructions Will Be Listed Below (If Applicable). None

## 2018-09-20 NOTE — Addendum Note (Signed)
Addended by: Therisa Doyne on: 09/20/2018 11:07 AM   Modules accepted: Orders

## 2018-09-22 ENCOUNTER — Telehealth: Payer: Self-pay

## 2018-09-22 NOTE — Telephone Encounter (Signed)
Faxed signed order to [facility] for diagnostic mammogram on 5/18, sent to [facility] for scan.

## 2018-10-06 ENCOUNTER — Encounter: Payer: Self-pay | Admitting: Hematology

## 2018-10-20 ENCOUNTER — Other Ambulatory Visit: Payer: Self-pay

## 2018-10-20 MED ORDER — DILTIAZEM HCL ER COATED BEADS 360 MG PO CP24: 360.0000 mg | ORAL_CAPSULE | Freq: Every day | ORAL | 1 refills | Status: DC

## 2018-10-24 ENCOUNTER — Telehealth: Payer: Self-pay | Admitting: Hematology

## 2018-10-24 NOTE — Telephone Encounter (Signed)
I talk with patient and she preferred to come in

## 2018-10-25 NOTE — Progress Notes (Signed)
[name]   Telephone:(336) [phone] Fax:(336) [phone]   Clinic Follow up Note   Patient Care Team: [name], NP as PCP - General (Family Medicine) Lelon Perla, MD as PCP - Cardiology (Cardiology) 10/26/2018  CHIEF COMPLAINT: f/u right breast cancer, triple negative   SUMMARY OF ONCOLOGIC HISTORY: Oncology History Overview Note  Cancer Staging Breast cancer of upper-outer quadrant of right female breast [facility]) Staging form: Breast, AJCC 8th Edition - Clinical stage from 09/22/2017: Stage IB (cT1b, cN0, cM0, G2, ER-, PR-, HER2-) - Signed by Truitt Merle, MD on 10/08/2017  Breast cancer, left breast [facility]) Staging form: Breast, AJCC 7th Edition - Clinical stage from 09/12/2012: Stage IA (T1b, N0, M0) - Unsigned      Breast cancer, left breast ([place])  08/25/2012 Receptors her2   ER 100% positive, PR 88% positive, HER-2 negative, Ki-67 16%   08/30/2012 Initial Diagnosis   Breast cancer, left breast   09/12/2012 Pathology Results   T1bN0 Grade 1 invasive ductal carcinoma, and DCIS.   09/12/2012 Surgery   Left breast lumpectomy and sentinel lymph node biopsy, negative margins.   11/09/2012 - 12/13/2012 Radiation Therapy   Adjuvant breast radiation   12/2012 - 11/2017 Anti-estrogen oral therapy   Anastrozole 1 mg daily, switched to Aromasin 25 mg daily in Jan 2015 due to diarrhea. Her Exemestane was switched to letrozole in 08/2017 due to high copay. She completed her 5 years in 11/2017.    09/01/2016 Imaging   [name] Outside films Breast form [facility] 09/01/2016 IMPRESSION: Probably Benign 1. No significant change in oval hypoechoic masses in the left breast at 11:00 2 cm from the nipple and 10:30 4 cm from the nipple. Both of these masses resemble the area of fat necrosis previously biopsies at 2:00. In addition, both masses have developing central benign or dystrophic appearing calcifications and are favored to be other areas of fat necrosis. A 6 month follow-up left breast  mammogram and ultrasound is recommended.   09/01/2016 Mammogram   [facility] Mammogram from [facility] 09/01/16 IMPRESSION  Incomplete - additional imaging evaluation needed Ultrasound of the upper medial left breast mass is recommended.    03/09/2017 Mammogram   Previous lumpectomy changes in the upper outer left breast anterior depth with stable associated dystrophic calcifications.  Biopsy clip at the 2:00 in the left breast near the lumpectomy site corresponding to previously biopsied benign fat necrosis.  Persistent round mass in the upper medial left breast with benign-appearing central round calcification.  No significant masses, calcifications, or other findings are seen in the breast  Impression: ultrasound is recommended for the left breast   03/09/2017 Breast US   Left breast ultrasound impression:  No significant change in oval hypoechoic masses in the left breast at 11:00 2 cm from the nipple and 10:30 4 cm from the nipple.  Both of these masses resemble the area of fat necrosis previously biopsied at 2:00.  In addition, both masses have developing central benign or dystrophic appearing calcifications and are favored to be other areas of fat necrosis.  A 46-monthfollow-up bilateral mammogram and left breast ultrasound is recommended.   09/14/2017 Breast [place]  Breast [place] 09/14/17 at [facility]  IMPRESSION:  The 1 cm oval mass in the left breast at 9:30 posterior depth is highly suggestive of malignancy. An Ultrasound guided biopsy is recommended.  The 0.8 cm round mass in the left breast at 11-12 o'clock anterior depth is suspicious of malignancy. An ultrasound guided biopsy  is recommended.    09/22/2017 Pathology Results   Diagnosis 1. Breast, right, needle core biopsy - INVASIVE DUCTAL CARCINOMA, MSBR GRADE II/III. - DUCTAL CARCINOMA IN SITU WITH NECROSIS. 2. Breast, left, needle core biopsy - FAT NECROSIS. - NO EVIDENCE OF MALIGNANCY.   10/17/2017 Genetic Testing   Negative genetic  testing on the Multicancer panel.  The Multi-Gene Panel offered by [facility] includes sequencing and/or deletion duplication testing of the following 83 genes: ALK, APC, ATM, AXIN2,BAP1,  BARD1, BLM, BMPR1A, BRCA1, BRCA2, BRIP1, CASR, CDC73, CDH1, CDK4, CDKN1B, CDKN1C, CDKN2A (p14ARF), CDKN2A (p16INK4a), CEBPA, CHEK2, CTNNA1, DICER1, DIS3L2, EGFR (c.2369C>T, p.Thr790Met variant only), EPCAM (Deletion/duplication testing only), FH, FLCN, GATA2, GPC3, GREM1 (Promoter region deletion/duplication testing only), HOXB13 (c.251G>A, p.Gly84Glu), HRAS, KIT, MAX, MEN1, MET, MITF (c.952G>A, p.Glu318Lys variant only), MLH1, MSH2, MSH3, MSH6, MUTYH, NBN, NF1, NF2, NTHL1, PALB2, PDGFRA, PHOX2B, PMS2, POLD1, POLE, POT1, PRKAR1A, PTCH1, PTEN, RAD50, RAD51C, RAD51D, RB1, RECQL4, RET, RUNX1, SDHAF2, SDHA (sequence changes only), SDHB, SDHC, SDHD, SMAD4, SMARCA4, SMARCB1, SMARCE1, STK11, SUFU, TERT, TERT, TMEM127, TP53, TSC1, TSC2, VHL, WRN and WT1.  The report date is October 17, 2017.   Breast cancer of upper-outer quadrant of right female breast ([place])  09/14/2017 Mammogram   Diagnostic mammogram at [facility]  IMPRESSION:  The new 0.9 cm oval high density mass in the right breast posterior depth superior region seen on the mediolateral oblique view only is indeterminate. The 1.1 cm focal asymmetry in the left breast central to the nipple anterior depth is indeterminate.    09/14/2017 Imaging   [name] Bilateral at [facility] IMPRESSION: The 1 cm oval mass in the right breast at 9:30 posterior depth is highly suggestive of malignancy. The 0.8 cm round mass in the left breast at 11-12 o'clock anterior depth is suspicious of malignancy.    09/22/2017 Cancer Staging   Staging form: Breast, AJCC 8th Edition - Clinical stage from 09/22/2017: Stage IB (cT1b, cN0, cM0, G2, ER-, PR-, HER2-) - Signed by Truitt Merle, MD on 10/08/2017   09/22/2017 Pathology Results   Bilateral needle core biopsy 1. Breast, right, needle core biopsy - INVASIVE DUCTAL  CARCINOMA, MSBR GRADE II/III. - DUCTAL CARCINOMA IN SITU WITH NECROSIS. 2. Breast, left, needle core biopsy - FAT NECROSIS. - NO EVIDENCE OF MALIGNANCY.   09/25/2017 Receptors her2   Estrogen Receptor: 0 Progesterone 0 HER2: Negative. Ki-67: 80%    10/08/2017 Initial Diagnosis   Breast cancer of upper-outer quadrant of right female breast ([place])   10/26/2017 Surgery   BREAST LUMPECTOMY WITH RADIOACTIVE SEED AND SENTINEL LYMPH NODE BIOPSY by Dr. Excell Seltzer  10/26/17   10/26/2017 Pathology Results   Diagnosis 10/26/17 1. Breast, lumpectomy, Right - INVASIVE DUCTAL CARCINOMA, GRADE 2, SPANNING 1 CM. - HIGH GRADE DUCTAL CARCINOMA IN SITU WITH NECROSIS. - FINAL RESECTION MARGINS (PARTS #2, 3, 4) ARE NEGATIVE. - BIOPSY SITE. - SEE ONCOLOGY TABLE. 2. Breast, excision, additional anterior margin- 2 pieces right - BENIGN BREAST TISSUE. 3. Breast, excision, Right unoriented anterior margin - BENIGN BREAST TISSUE. 4. Breast, excision, Right deep margin - BENIGN BREAST TISSUE. 5. Lymph node, sentinel, biopsy, Right Axillary - ONE OF ONE LYMPH NODES NEGATIVE FOR CARCINOMA (0/1).   10/26/2017 Cancer Staging   Staging form: Breast, AJCC 8th Edition - Pathologic stage from 10/26/2017: Stage IB (pT1b, pN0, cM0, G2, ER-, PR-, HER2-) - Signed by Truitt Merle, MD on 11/12/2017   12/03/2017 -  Chemotherapy   Weekly Abraxane for 12 weeks 12/03/17-02/25/18   03/2018 - 04/19/2018 Radiation Therapy  With Dr. Isidore Moos     CURRENT THERAPY: Surveillance   INTERVAL HISTORY: Ms. Portner returns for f/u as scheduled. She was last seen 03/2018. She completed radiation in 04/2018 and had virtual f/u with Dr. Isidore Moos in January 2020. She had bilat mammogram in 10/2018 which was negative. She feels well. Normal appetite. Walking 2.5-3 miles per day. She is taking macrobid for UTI currently. Antibiotic is causing some diarrhea, but mild. Otherwise denies n/v. She notes she is sweating a lot lately, no fever or chills. Denies  cough, chest pain, dyspnea. Denies changes in her breast. Denies neuropathy. She lost 3 toenails recently.     MEDICAL HISTORY:  Past Medical History:  Diagnosis Date  . Allergy   . Atrial fibrillation ([place])   . Breast cancer ([place]) 08/25/12   Invasive ductal ca,DCIS  . Diabetes mellitus    type II  . Family history of cancer   . Gout   . Hearing loss   . History of breast cancer   . History of frequent urinary tract infections   . History of radiation therapy 03/21/18- 04/19/18   Right Breast 2.67 Gy X 15 fractions, right breast boost 2 Gy X 5 fractions.   Marland Kitchen Hx of radiation therapy 11/22/12- 12/19/12   breast 4250 cGy 17 sessions, left breast boost 750 cGy 3 sessions  . Hyperlipidemia   . Hypertension   . Osteopenia     SURGICAL HISTORY: Past Surgical History:  Procedure Laterality Date  . ABDOMINAL HYSTERECTOMY Bilateral 1999   w/b/l salpingo-oopherectomy  . APPENDECTOMY    . BREAST LUMPECTOMY WITH NEEDLE LOCALIZATION AND AXILLARY SENTINEL LYMPH NODE BX Left 09/12/2012   Procedure: LEFT NEEDLE LOCALIZATION BREAST LUMPECTOMY TION AND AXILLARY SENTINEL LYMPH NODE BX;  Surgeon: Edward Jolly, MD;  Location: [facility];  Service: General;  Laterality: Left;  . BREAST LUMPECTOMY WITH RADIOACTIVE SEED AND SENTINEL LYMPH NODE BIOPSY Right 10/26/2017   Procedure: BREAST LUMPECTOMY WITH RADIOACTIVE SEED AND SENTINEL LYMPH NODE BIOPSY;  Surgeon: Excell Seltzer, MD;  Location: [facility];  Service: General;  Laterality: Right;  . BREAST SURGERY  1990's   right- fibroid cyst  . CHOLECYSTECTOMY    . SEPTOPLASTY    . TONSILLECTOMY      I have reviewed the social history and family history with the patient and they are unchanged from previous note.  ALLERGIES:  is allergic to penicillins; other; prednisone; radiaplexrx [woun'dres hydrogel wound dress]; and sulfamethoxazole.  MEDICATIONS:  Current Outpatient Medications  Medication Sig Dispense Refill  . ACCU-CHEK  AVIVA PLUS test strip USE TO TEST TWICE DAILY **ICD-10 CODE E13.9** 200 each 3  . allopurinol (ZYLOPRIM) 300 MG tablet TAKE 1 TABLET BY MOUTH DAILY. 90 tablet 3  . apixaban (ELIQUIS) 5 MG TABS tablet Take 1 tablet (5 mg total) by mouth 2 (two) times daily. 180 tablet 1  . atenolol (TENORMIN) 25 MG tablet Take 1 tablet (25 mg total) by mouth daily. 90 tablet 1  . chlorpheniramine (CHLOR-TRIMETON) 4 MG tablet Take 4 mg 2 (two) times daily as needed by mouth for allergies.    Marland Kitchen diltiazem (CARDIZEM CD) 360 MG 24 hr capsule Take 1 capsule (360 mg total) by mouth daily. 90 capsule 1  . fish oil-omega-3 fatty acids 1000 MG capsule Take 1 g by mouth daily.    Marland Kitchen lidocaine-prilocaine (EMLA) cream Apply 1 application topically as needed. 30 g 3  . loperamide (IMODIUM) 2 MG capsule Take 2 mg by mouth as needed  for diarrhea or loose stools.    . metFORMIN (GLUCOPHAGE) 500 MG tablet TAKE 1 TABLET BY MOUTH TWICE A DAY 180 tablet 0  . Multiple Vitamin (MULTIVITAMIN) tablet Take 1 tablet by mouth daily.    Marland Kitchen OVER THE COUNTER MEDICATION Take by mouth 2 (two) times daily. Presavision- for macular degeneration prevention    . TOBREX 0.3 % ophthalmic ointment APPLY A SMALL AMOUNT ON EYELID THREE TIMES A DAY AS NEEDED    . VITAMIN D, ERGOCALCIFEROL, PO Take by mouth.     No current facility-administered medications for this visit.     PHYSICAL EXAMINATION: ECOG PERFORMANCE STATUS: 0 - Asymptomatic  Vitals:   10/26/18 1554  BP: 135/75  Pulse: 74  Resp: 18  Temp: (!) 97.1 F (36.2 C)  SpO2: 96%   Filed Weights   10/26/18 1554  Weight: 127 lb 11.2 oz (57.9 kg)    GENERAL:alert, no distress and comfortable SKIN: no rash  EYES:  sclera clear LYMPH:  no palpable cervical, supraclavicular, or axillary lymphadenopathy LUNGS: Respirations even and unlabored HEART:  no lower extremity edema Musculoskeletal:no cyanosis of digits  NEURO: alert & oriented x 3 with fluent speech, normal gait Breast exam: That  is post bilateral lumpectomy, incisions are well-healed.  No palpable mass in either breast or axilla that I could appreciate.  Mild hyperpigmentation and skin thickening to right breast Limited exam for COVID-19 outbreak  LABORATORY DATA:  I have reviewed the data as listed CBC Latest Ref Rng & Units 10/26/2018 04/29/2018 03/23/2018  WBC 4.0 - 10.5 K/uL 5.7 6.4 10.8(H)  Hemoglobin 12.0 - 15.0 g/dL 14.0 14.4 12.0  Hematocrit 36.0 - 46.0 % 42.9 44.3 38.1  Platelets 150 - 400 K/uL 181 184.0 209     CMP Latest Ref Rng & Units 10/26/2018 04/29/2018 03/23/2018  Glucose 70 - 99 mg/dL 231(H) 115(H) 196(H)  BUN 8 - 23 mg/dL 25(H) 22 27(H)  Creatinine 0.44 - 1.00 mg/dL 1.29(H) 1.15 1.23(H)  Sodium 135 - 145 mmol/L 142 142 142  Potassium 3.5 - 5.1 mmol/L 4.0 3.6 4.1  Chloride 98 - 111 mmol/L 107 107 107  CO2 22 - 32 mmol/L _0 Calcium 8.9 - 10.3 mg/dL 8.9 9.5 9.8  Total Protein 6.5 - 8.1 g/dL 6.6 6.5 6.9  Total Bilirubin 0.3 - 1.2 mg/dL 0.3 0.4 0.5  Alkaline Phos 38 - 126 U/L 95 87 109  AST 15 - 41 U/L _1 ALT 0 - 44 U/L _2 RADIOGRAPHIC STUDIES: I have personally reviewed the radiological images as listed and agreed with the findings in the report. No results found.   ASSESSMENT & PLAN: 84 y.o. Adrienne [name], [place] woman   1. Breast cancer of upper outer quadrant of right breast, invasive ductal carcinoma, stage IB, pT1bN0M0, grade 2, Triple Negative, Ki67: 80% -Newly diagnosed right breast is different from her previous left breast cancer. -Her genetic testing was negative -She previously underwent right breast lumpectomy with SLNB by Dr. Excell Seltzer on 10/26/17.  Final path shows a 1cm tumor, G2, triple negative tumor was completely resection with negative margin, node negative.  This type is more aggressive than her previous cancer.  -She underwent adjuvant chemotherapy with weekly Abraxane, with dose reduction.  She tolerated well overall. -She had mild peripheral  neuropathy towards the end of chemo, resolved now.  -S/p adjuvant radiation per Dr. Isidore Moos, from 11-04/2018 -Ms. Cataldi is doing well.  She recovered well from  definitive treatment.  CBC is normal.  CMP with elevated BG and creatinine to 1.29. Mammogram on 10/2018 is negative for malignancy.  Breast exam is unremarkable.  No clinical concern for recurrence in the breast -Continue breast cancer surveillance -Elevated creatinine may be related to current antibiotic use, she is on Macrobid for UTI.  I encouraged her to increase p.o. hydration and follow-up with PCP -Return for lab and follow-up in 6 months  2. pT1bN0, stage IA invasive ductal carcinoma of the left breast, grade I, ER 100%, PR 88%, Ki-67 16%, HER-2/neu negative. -She previously underwent lumpectomy with sentinel lymph node biopsy on 09/12/2012, s/p radiation therapy from 11/09/2012 through 12/19/2012.  -Her left breast biopsy in 08/2015 showed fat necrosis. No clinical evidence of recurrence. -Her repeated mammogram in May 2018 showed 2 lesions in UIQ of left breast, stable, likely benign (fat necrosis per biopsy in 2017) -Left breast mammogram and ultrasound on 03/09/2017 reveal hypoechoic masses that have developed central benign or dystrophic appearing calcifications and are favored to be areas of fat necrosis. -She was on adjuvant Exemestane, tolerated well -09/16/17 US showed mass behind her left breast scar and a mass In her right breast, both highly suggestive of malignancy.  -Her Exemestane was switched to letrozole in 08/2017 due to high copay. She tolerated well and completed in 11/2017.  3. Osteopenia -She is on vitamin D supplement daily; she stopped calcium due to hypercalcemia -last DEXA on 09/20/2017 was previously reviewed which showed: Osteopenia, very high risk of fracture, 29% for major osteoporotic fracture and 18% for hip fracture.  Dr. Burr Medico previously discussed Prolia, patient is reluctant due to side effects  4.  Hypercalcemia  -CA 8.9 today  5. Diabetes and hyperglycemia  -She had elevated blood sugar when on steroids for hip pain, DC'd steroids  -She is on metformin -BG 231 today -Strongly encouraged her to follow-up with PCP, increase her hydration, and eating well  6. Social Support -She lives at [facility] living and is very independent -Her children live close by her  -She recently has a new great grandson, has not seen him yet due to covid19  7.  Peripheral neuropathy, grade 1, secondary to chemotherapy  -resolved   Plan: -Labs, mammogram reviewed, no clinical concern for recurrence -Continue breast cancer surveillance -F/u with PCP for elevated BG and creatinine -Currently on Macrobid for UTI -Annual mammogram due 10/2019 -Lab, follow-up with Dr. Burr Medico in 6 months  All questions were answered. The patient knows to call the clinic with any problems, questions or concerns. No barriers to learning was detected.     Alla Feeling, NP 10/26/18

## 2018-10-26 ENCOUNTER — Encounter: Payer: Self-pay | Admitting: Nurse Practitioner

## 2018-10-26 ENCOUNTER — Inpatient Hospital Stay: Payer: Medicare Other

## 2018-10-26 ENCOUNTER — Other Ambulatory Visit: Payer: Self-pay

## 2018-10-26 ENCOUNTER — Inpatient Hospital Stay: Payer: Medicare Other | Attending: Hematology | Admitting: Nurse Practitioner

## 2018-10-26 VITALS — BP 135/75 | HR 74 | Temp 97.1°F | Resp 18 | Wt 127.7 lb

## 2018-10-26 DIAGNOSIS — Z170 Estrogen receptor positive status [ER+]: Secondary | ICD-10-CM

## 2018-10-26 DIAGNOSIS — C50411 Malignant neoplasm of upper-outer quadrant of right female breast: Secondary | ICD-10-CM | POA: Insufficient documentation

## 2018-10-26 DIAGNOSIS — Z7984 Long term (current) use of oral hypoglycemic drugs: Secondary | ICD-10-CM | POA: Insufficient documentation

## 2018-10-26 DIAGNOSIS — N390 Urinary tract infection, site not specified: Secondary | ICD-10-CM | POA: Insufficient documentation

## 2018-10-26 DIAGNOSIS — Z853 Personal history of malignant neoplasm of breast: Secondary | ICD-10-CM | POA: Diagnosis not present

## 2018-10-26 DIAGNOSIS — Z171 Estrogen receptor negative status [ER-]: Secondary | ICD-10-CM

## 2018-10-26 DIAGNOSIS — M8580 Other specified disorders of bone density and structure, unspecified site: Secondary | ICD-10-CM | POA: Insufficient documentation

## 2018-10-26 DIAGNOSIS — C50412 Malignant neoplasm of upper-outer quadrant of left female breast: Secondary | ICD-10-CM

## 2018-10-26 DIAGNOSIS — E1165 Type 2 diabetes mellitus with hyperglycemia: Secondary | ICD-10-CM | POA: Insufficient documentation

## 2018-10-26 DIAGNOSIS — R944 Abnormal results of kidney function studies: Secondary | ICD-10-CM

## 2018-10-26 DIAGNOSIS — R197 Diarrhea, unspecified: Secondary | ICD-10-CM

## 2018-10-26 LAB — CMP (CANCER CENTER ONLY)
ALT: 27 U/L (ref 0–44)
AST: 22 U/L (ref 15–41)
Albumin: 3.7 g/dL (ref 3.5–5.0)
Alkaline Phosphatase: 95 U/L (ref 38–126)
Anion gap: 10 (ref 5–15)
BUN: 25 mg/dL — ABNORMAL HIGH (ref 8–23)
CO2: 25 mmol/L (ref 22–32)
Calcium: 8.9 mg/dL (ref 8.9–10.3)
Chloride: 107 mmol/L (ref 98–111)
Creatinine: 1.29 mg/dL — ABNORMAL HIGH (ref 0.44–1.00)
GFR, Est AFR Am: 45 mL/min — ABNORMAL LOW (ref >60)
GFR, Estimated: 39 mL/min — ABNORMAL LOW (ref >60)
Glucose, Bld: 231 mg/dL — ABNORMAL HIGH (ref 70–99)
Potassium: 4 mmol/L (ref 3.5–5.1)
Sodium: 142 mmol/L (ref 135–145)
Total Bilirubin: 0.3 mg/dL (ref 0.3–1.2)
Total Protein: 6.6 g/dL (ref 6.5–8.1)

## 2018-10-26 LAB — CBC WITH DIFFERENTIAL (CANCER CENTER ONLY)
Abs Immature Granulocytes: 0.03 10*3/uL (ref 0.00–0.07)
Basophils Absolute: 0.1 10*3/uL (ref 0.0–0.1)
Basophils Relative: 1 %
Eosinophils Absolute: 0.1 10*3/uL (ref 0.0–0.5)
Eosinophils Relative: 2 %
HCT: 42.9 % (ref 36.0–46.0)
Hemoglobin: 14 g/dL (ref 12.0–15.0)
Immature Granulocytes: 1 %
Lymphocytes Relative: 21 %
Lymphs Abs: 1.2 10*3/uL (ref 0.7–4.0)
MCH: 31.7 pg (ref 26.0–34.0)
MCHC: 32.6 g/dL (ref 30.0–36.0)
MCV: 97.1 fL (ref 80.0–100.0)
Monocytes Absolute: 0.7 10*3/uL (ref 0.1–1.0)
Monocytes Relative: 12 %
Neutro Abs: 3.6 10*3/uL (ref 1.7–7.7)
Neutrophils Relative %: 63 %
Platelet Count: 181 10*3/uL (ref 150–400)
RBC: 4.42 MIL/uL (ref 3.87–5.11)
RDW: 14.1 % (ref 11.5–15.5)
WBC Count: 5.7 10*3/uL (ref 4.0–10.5)
nRBC: 0 % (ref 0.0–0.2)

## 2018-10-27 ENCOUNTER — Telehealth: Payer: Self-pay | Admitting: Nurse Practitioner

## 2018-10-27 NOTE — Telephone Encounter (Signed)
Scheduled appt per 6/24 los. Spoke with patient and she is aware of her appt date and time. °

## 2018-10-31 ENCOUNTER — Other Ambulatory Visit: Payer: Medicare Other

## 2018-10-31 ENCOUNTER — Ambulatory Visit: Payer: Medicare Other | Admitting: Hematology

## 2018-12-14 ENCOUNTER — Other Ambulatory Visit: Payer: Self-pay | Admitting: Adult Health

## 2018-12-14 ENCOUNTER — Other Ambulatory Visit: Payer: Self-pay | Admitting: Family Medicine

## 2018-12-14 DIAGNOSIS — E139 Other specified diabetes mellitus without complications: Secondary | ICD-10-CM

## 2018-12-14 NOTE — Telephone Encounter (Signed)
Sent to the pharmacy by e-scribe for 90 days.  Pt now scheduled for A1C lab and follow up.

## 2018-12-21 ENCOUNTER — Other Ambulatory Visit: Payer: Self-pay

## 2018-12-21 ENCOUNTER — Other Ambulatory Visit (INDEPENDENT_AMBULATORY_CARE_PROVIDER_SITE_OTHER): Payer: Medicare Other

## 2018-12-21 DIAGNOSIS — E139 Other specified diabetes mellitus without complications: Secondary | ICD-10-CM | POA: Diagnosis not present

## 2018-12-21 LAB — HEMOGLOBIN A1C: Hgb A1c MFr Bld: 7.1 % — ABNORMAL HIGH (ref 4.6–6.5)

## 2018-12-28 ENCOUNTER — Other Ambulatory Visit: Payer: Medicare Other

## 2018-12-29 ENCOUNTER — Other Ambulatory Visit: Payer: Self-pay

## 2018-12-29 ENCOUNTER — Encounter: Payer: Self-pay | Admitting: Adult Health

## 2018-12-29 ENCOUNTER — Ambulatory Visit (INDEPENDENT_AMBULATORY_CARE_PROVIDER_SITE_OTHER): Payer: Medicare Other | Admitting: Adult Health

## 2018-12-29 DIAGNOSIS — E139 Other specified diabetes mellitus without complications: Secondary | ICD-10-CM | POA: Diagnosis not present

## 2018-12-29 MED ORDER — ACCU-CHEK AVIVA PLUS VI STRP: ORAL_STRIP | 3 refills | Status: DC

## 2018-12-29 MED ORDER — BLOOD GLUCOSE MONITOR SYSTEM W/DEVICE KIT: PACK | 0 refills | Status: DC

## 2018-12-29 NOTE — Progress Notes (Signed)
Virtual Visit via Telephone Note  I connected with Adrienne [name] on 12/29/18 at 10:00 AM EDT by telephone and verified that I am speaking with the correct person using two identifiers.   I discussed the limitations, risks, security and privacy concerns of performing an evaluation and management service by telephone and the availability of in person appointments. I also discussed with the patient that there may be a patient responsible charge related to this service. The patient expressed understanding and agreed to proceed.  Location patient: home Location provider: work or home office Participants present for the call: patient, provider Patient did not have a visit in the prior 7 days to address this/these issue(s).   History of Present Illness: 84 year old female who  has a past medical history of Allergy, Atrial fibrillation ([place]), Breast cancer ([place]) (08/25/12), Diabetes mellitus, Family history of cancer, Gout, Hearing loss, History of breast cancer, History of frequent urinary tract infections, History of radiation therapy (03/21/18- 04/19/18), radiation therapy (11/22/12- 12/19/12), Hyperlipidemia, Hypertension, and Osteopenia.  Follow up regarding DM. She is currently maintained on Metformin 500 mg BID. She has been monitoring her blood sugar at home and is getting readings between 120-150. She has been eating more sweets than she normally does but is walking about 2 miles a day. She denies hypoglycemia episodes.   She would like a new meter and needs more testing strips   Her last A1c was 6.9    Observations/Objective: Patient sounds cheerful and well on the phone. I do not appreciate any SOB. Speech and thought processing are grossly intact. Patient reported vitals:  Assessment and Plan: 1. Diabetes 1.5, managed as type 2 ([place]) - A1c is 7.1  - No change in medication at this time  - Cut back on sweets.  - Will retest at CPE in December - Blood Glucose Monitoring Suppl (BLOOD  GLUCOSE MONITOR SYSTEM) w/Device KIT; Accu Check Aviva Meter  Dispense: 1 kit; Refill: 0 - glucose blood (ACCU-CHEK AVIVA PLUS) test strip; USE TO TEST TWICE DAILY **ICD-10 CODE E13.9**  Dispense: 200 each; Refill: 3   Follow Up Instructions:  I did not refer this patient for an OV in the next 24 hours for this/these issue(s).  I discussed the assessment and treatment plan with the patient. The patient was provided an opportunity to ask questions and all were answered. The patient agreed with the plan and demonstrated an understanding of the instructions.   The patient was advised to call back or seek an in-person evaluation if the symptoms worsen or if the condition fails to improve as anticipated.  I provided 18 minutes of non-face-to-face time during this encounter.   [name], NP

## 2019-02-04 ENCOUNTER — Other Ambulatory Visit: Payer: Self-pay

## 2019-02-04 ENCOUNTER — Ambulatory Visit (INDEPENDENT_AMBULATORY_CARE_PROVIDER_SITE_OTHER): Payer: Medicare Other

## 2019-02-04 DIAGNOSIS — Z23 Encounter for immunization: Secondary | ICD-10-CM | POA: Diagnosis not present

## 2019-03-17 ENCOUNTER — Other Ambulatory Visit: Payer: Self-pay | Admitting: Adult Health

## 2019-03-17 DIAGNOSIS — E139 Other specified diabetes mellitus without complications: Secondary | ICD-10-CM

## 2019-03-21 NOTE — Telephone Encounter (Signed)
Pt has upcoming follow up for diabetes.

## 2019-03-22 ENCOUNTER — Other Ambulatory Visit: Payer: Self-pay | Admitting: Adult Health

## 2019-03-23 NOTE — Telephone Encounter (Signed)
SENT TO THE PHARMACY BY E-SCRIBE.  PT HAS UPCOMING DM FU.  WILL NEED TO BE SCHEDULED FOR CPX.

## 2019-03-27 ENCOUNTER — Encounter: Payer: Self-pay | Admitting: Orthopaedic Surgery

## 2019-03-27 ENCOUNTER — Ambulatory Visit: Payer: Self-pay

## 2019-03-27 ENCOUNTER — Other Ambulatory Visit: Payer: Self-pay

## 2019-03-27 ENCOUNTER — Ambulatory Visit: Payer: Medicare Other | Admitting: Orthopaedic Surgery

## 2019-03-27 DIAGNOSIS — M79604 Pain in right leg: Secondary | ICD-10-CM

## 2019-03-27 DIAGNOSIS — M5431 Sciatica, right side: Secondary | ICD-10-CM | POA: Diagnosis not present

## 2019-03-27 DIAGNOSIS — M25551 Pain in right hip: Secondary | ICD-10-CM

## 2019-03-27 MED ORDER — GABAPENTIN 100 MG PO CAPS: 100.0000 mg | ORAL_CAPSULE | Freq: Every day | ORAL | 1 refills | Status: DC

## 2019-03-27 NOTE — Progress Notes (Signed)
Office Visit Note   Patient: Adrienne [name]           Date of Birth: [birth year]           MRN: [id number] Visit Date: 03/27/2019              Requested by: [name], NP [place],  [place] [address] PCP: [name], NP   Assessment & Plan: Visit Diagnoses:  1. Pain in right hip   2. Pain in right leg   3. Sciatica, right side     Plan: At this point I do feel that she would benefit from formal physical therapy to work on back extension exercises and other modalities that can calm down her right-sided sciatica symptoms.  I did send in some Neurontin to try with just a small dose of 100 mg at night.  All question concerns were answered addressed.  We will see her back after some formal physical therapy.  Follow-Up Instructions: Return in about 4 weeks (around 04/24/2019).   Orders:  Orders Placed This Encounter  Procedures  . XR HIP UNILAT W OR W/O PELVIS 1V RIGHT  . XR Lumbar Spine 2-3 Views   Meds ordered this encounter  Medications  . gabapentin (NEURONTIN) 100 MG capsule    Sig: Take 1 capsule (100 mg total) by mouth at bedtime.    Dispense:  30 capsule    Refill:  1      Procedures: No procedures performed   Clinical Data: No additional findings.   Subjective: Chief Complaint  Patient presents with  . Right Hip - Pain  The patient is an 84 year old female that I am seeing for the first time.  She comes in with a chief complaint of right hip and buttock type pain.  Is been going on for about 2 to 3 months now.  She says it only bothers her at night when she is laying on the bed and sleeping on that side.  She points to her sciatic area as a source of her pain and not the side of her hip.  She denies any groin pain.  She walks about 2 miles daily.  She is on Eliquis so she cannot take anti-inflammatories.  She is a diabetic and her normal blood glucose runs 1 25-1 30.  She says that steroids to increase her blood glucose levels.  She  denies any weakness in her legs.  She denies any numbness tingling her feet.  HPI  Review of Systems Today she is alert and oriented.  She walks without an assistive device.  She currently denies any headache, chest pain, shortness of breath, fever, chills, nausea, vomiting  Objective: Vital Signs: There were no vitals taken for this visit.  Physical Exam  Ortho Exam She is able to get up on the exam table easily.  She has 5 out of 5 strength in her bilateral lower extremities.  She has negative straight leg raise to the right side.  Her hips move smoothly.  There is no pain with palpation of the trochanteric area.  There are some deep pain over the ischial area. Specialty Comments:  No specialty comments available.  Imaging: Xr Hip Unilat W Or W/o Pelvis 1v Right  Result Date: 03/27/2019 An AP pelvis and lateral of the right hip shows no acute findings.  The hip joint is well-maintained.  Xr Lumbar Spine 2-3 Views  Result Date: 03/27/2019 2 views of the lumbar spine show significant degenerative  changes at multiple levels.  There is a spondylolisthesis at L4 on L5 that is grade 1.    PMFS History: Patient Active Problem List   Diagnosis Date Noted  . Port-A-Cath in place 01/28/2018  . Genetic testing 10/18/2017  . Family history of cancer   . History of breast cancer   . Breast cancer of upper-outer quadrant of right female breast ([place]) 10/08/2017  . Osteopenia 09/21/2015  . Rapid heart rate 04/01/2015  . Atrial fibrillation ([place]) 04/01/2015  . Diabetes 1.5, managed as type 2 ([place]) 09/04/2014  . Breast cancer, left breast ([place]) 08/30/2012  . Influenza 08/04/2011  . Gout 06/01/2011  . Vaginitis, atrophic 06/01/2011  . RENAL INSUFFICIENCY, ACUTE 05/28/2010  . HEMATURIA, HX OF 05/28/2010  . Disorder of bone and cartilage 12/08/2007  . Hyperlipidemia 08/10/2007  . Essential hypertension 08/10/2007   Past Medical History:  Diagnosis Date  . Allergy   . Atrial  fibrillation ([place])   . Breast cancer ([place]) 08/25/12   Invasive ductal ca,DCIS  . Diabetes mellitus    type II  . Family history of cancer   . Gout   . Hearing loss   . History of breast cancer   . History of frequent urinary tract infections   . History of radiation therapy 03/21/18- 04/19/18   Right Breast 2.67 Gy X 15 fractions, right breast boost 2 Gy X 5 fractions.   Marland Kitchen Hx of radiation therapy 11/22/12- 12/19/12   breast 4250 cGy 17 sessions, left breast boost 750 cGy 3 sessions  . Hyperlipidemia   . Hypertension   . Osteopenia     Family History  Problem Relation Age of Onset  . Hypertension Mother   . Heart disease Mother        Murmur and irregular heart disease  . Dementia Mother        d. 42  . Hypertension Father   . Aneurysm Father 12  . Breast cancer Cousin 73       mat first cousin    Past Surgical History:  Procedure Laterality Date  . ABDOMINAL HYSTERECTOMY Bilateral 1999   w/b/l salpingo-oopherectomy  . APPENDECTOMY    . BREAST LUMPECTOMY WITH NEEDLE LOCALIZATION AND AXILLARY SENTINEL LYMPH NODE BX Left 09/12/2012   Procedure: LEFT NEEDLE LOCALIZATION BREAST LUMPECTOMY TION AND AXILLARY SENTINEL LYMPH NODE BX;  Surgeon: Edward Jolly, MD;  Location: [facility];  Service: General;  Laterality: Left;  . BREAST LUMPECTOMY WITH RADIOACTIVE SEED AND SENTINEL LYMPH NODE BIOPSY Right 10/26/2017   Procedure: BREAST LUMPECTOMY WITH RADIOACTIVE SEED AND SENTINEL LYMPH NODE BIOPSY;  Surgeon: Excell Seltzer, MD;  Location: [facility];  Service: General;  Laterality: Right;  . BREAST SURGERY  1990's   right- fibroid cyst  . CHOLECYSTECTOMY    . SEPTOPLASTY    . TONSILLECTOMY     Social History   Occupational History  . Not on file  Tobacco Use  . Smoking status: Former Smoker    Packs/day: 0.25    Years: 4.00    Pack years: 1.00    Types: Cigarettes  . Smokeless tobacco: Never Used  . Tobacco comment: Cigarette use was 50 years ago  Substance  and Sexual Activity  . Alcohol use: Not Currently  . Drug use: No  . Sexual activity: Never    Comment: menarche age 67, 45st birth age 80, G50, HRT x 7 years?

## 2019-04-10 ENCOUNTER — Telehealth: Payer: Self-pay | Admitting: Hematology

## 2019-04-10 NOTE — Telephone Encounter (Signed)
YF PAL moved 12/23 visit to 12/17. Confirmed with patient.

## 2019-04-11 ENCOUNTER — Other Ambulatory Visit: Payer: Self-pay | Admitting: Adult Health

## 2019-04-11 ENCOUNTER — Ambulatory Visit: Payer: Medicare Other | Attending: Orthopaedic Surgery

## 2019-04-11 ENCOUNTER — Other Ambulatory Visit: Payer: Self-pay

## 2019-04-11 DIAGNOSIS — R252 Cramp and spasm: Secondary | ICD-10-CM | POA: Diagnosis present

## 2019-04-11 DIAGNOSIS — G8929 Other chronic pain: Secondary | ICD-10-CM | POA: Diagnosis present

## 2019-04-11 DIAGNOSIS — I4821 Permanent atrial fibrillation: Secondary | ICD-10-CM

## 2019-04-11 DIAGNOSIS — R293 Abnormal posture: Secondary | ICD-10-CM | POA: Diagnosis present

## 2019-04-11 DIAGNOSIS — M5441 Lumbago with sciatica, right side: Secondary | ICD-10-CM | POA: Diagnosis present

## 2019-04-11 DIAGNOSIS — I480 Paroxysmal atrial fibrillation: Secondary | ICD-10-CM

## 2019-04-11 NOTE — Telephone Encounter (Signed)
66f 57.9kg Scr 1.29 (10/26/18) Lovw/lawrence (09/20/18) Pt requesting 5mg  eliquis but only qualifies for 2.5mg  needs a pharmd review

## 2019-04-11 NOTE — Therapy (Signed)
[facility] [address] [place], [place], [address] Phone: [phone]   Fax:  [phone]  Physical Therapy Treatment  Patient Details  Name: Adrienne [name] MRN: [id number] Date of Birth: [birth year] Referring Provider (PT): Jean Rosenthal, MD   Encounter Date: 04/11/2019  PT End of Session - 04/11/19 1225    Visit Number  1    Date for PT Re-Evaluation  06/06/19    Authorization Type  Medicare    PT Start Time  1148    PT Stop Time  1228    PT Time Calculation (min)  40 min    Activity Tolerance  Patient tolerated treatment well    Behavior During Therapy  [facility] for tasks assessed/performed       Past Medical History:  Diagnosis Date  . Allergy   . Atrial fibrillation ([place])   . Breast cancer ([place]) 08/25/12   Invasive ductal ca,DCIS  . Diabetes mellitus    type II  . Family history of cancer   . Gout   . Hearing loss   . History of breast cancer   . History of frequent urinary tract infections   . History of radiation therapy 03/21/18- 04/19/18   Right Breast 2.67 Gy X 15 fractions, right breast boost 2 Gy X 5 fractions.   Marland Kitchen Hx of radiation therapy 11/22/12- 12/19/12   breast 4250 cGy 17 sessions, left breast boost 750 cGy 3 sessions  . Hyperlipidemia   . Hypertension   . Osteopenia     Past Surgical History:  Procedure Laterality Date  . ABDOMINAL HYSTERECTOMY Bilateral 1999   w/b/l salpingo-oopherectomy  . APPENDECTOMY    . BREAST LUMPECTOMY WITH NEEDLE LOCALIZATION AND AXILLARY SENTINEL LYMPH NODE BX Left 09/12/2012   Procedure: LEFT NEEDLE LOCALIZATION BREAST LUMPECTOMY TION AND AXILLARY SENTINEL LYMPH NODE BX;  Surgeon: Edward Jolly, MD;  Location: [facility];  Service: General;  Laterality: Left;  . BREAST LUMPECTOMY WITH RADIOACTIVE SEED AND SENTINEL LYMPH NODE BIOPSY Right 10/26/2017   Procedure: BREAST LUMPECTOMY WITH RADIOACTIVE SEED AND SENTINEL LYMPH NODE BIOPSY;  Surgeon: Excell Seltzer, MD;  Location: [facility];  Service: General;  Laterality: Right;  . BREAST SURGERY  1990's   right- fibroid cyst  . CHOLECYSTECTOMY    . SEPTOPLASTY    . TONSILLECTOMY      There were no vitals filed for this visit.  Subjective Assessment - 04/11/19 1149    Subjective  Pt reports to PT with Rt leg pain that began 2-3 months ago without cause.  Pt is worse at night.    How long can you sit comfortably?  1 hour    Diagnostic tests  x-ray:  lumbar DDD at multiple levels and spondylolisthesis at L4 on L5 (grade 1)    Patient Stated Goals  sleep better, not wake with pain, sit for reading without limitation    Currently in Pain?  Yes    Pain Score  0-No pain   8-9/10 at night   Pain Location  Buttocks    Pain Orientation  Right    Pain Descriptors / Indicators  Aching;Burning;Shooting    Pain Type  Chronic pain    Pain Radiating Towards  Rt posterior and lateral thigh    Pain Onset  More than a month ago    Pain Frequency  Intermittent    Aggravating Factors   sleep at night, sitting to read    Pain Relieving Factors  when i change position and get up and move around in the morning         [facility] PT Assessment - 04/11/19 0001      Assessment   Medical Diagnosis  Sciatica, Rt    Referring Provider (PT)  Jean Rosenthal, MD    Onset Date/Surgical Date  02/09/19    Next MD Visit  1 month    Prior Therapy  none      Precautions   Precautions  None      Restrictions   Weight Bearing Restrictions  No      Balance Screen   Has the patient fallen in the past 6 months  Yes    How many times?  1   walking on uneven terrain and bent forward and lost balance   Has the patient had a decrease in activity level because of a fear of falling?   No    Is the patient reluctant to leave their home because of a fear of falling?   No      Home Environment   Living Environment  Assisted living    Living Arrangements  Alone    Home Access  Level  entry;Elevator    Additional Comments  Lives at [facility]      Prior Function   Level of [place]  Retired    Leisure  daily walking (2 miles), reading, exercise at [employer]   Overall Cognitive Status  Within Functional Limits for tasks assessed      Observation/Other Assessments   Focus on Therapeutic Outcomes (FOTO)   29% limitation      Posture/Postural Control   Posture/Postural Control  Postural limitations    Postural Limitations  Flexed trunk;Forward head    Posture Comments  Rt pelvic shift      ROM / Strength   AROM / PROM / Strength  AROM;PROM;Strength      AROM   Overall AROM   Within functional limits for tasks performed    Overall AROM Comments  all A/ROM in lumbar spine and hips is full without pain      PROM   Overall PROM   Within functional limits for tasks performed    Overall PROM Comments  all WFL in hips      Strength   Overall Strength  Within functional limits for tasks performed    Overall Strength Comments  5/5 hip and knee strength throughout      Palpation   SI assessment   WFL    Palpation comment  tension over Rt ITB and lateral gluteals.  No signifcant pain today      Special Tests   Other special tests  negative slump and SLR      Transfers   Transfers  Independent with all Transfers      Ambulation/Gait   Ambulation/Gait  Yes    Gait Pattern  Step-through pattern;Decreased stride length;Decreased weight shift to right                           PT Education - 04/11/19 1224    Education Details  Access Code: [email or web address]    Person(s) Educated  Patient    Methods  Explanation;Demonstration;Handout    Comprehension  Verbalized understanding;Returned demonstration       PT Short Term Goals - 04/11/19 1208  PT SHORT TERM GOAL #1   Title  be independent in initial HEP    Time  4    Period  Weeks    Status  New    Target Date  05/09/19      PT SHORT  TERM GOAL #2   Title  sleep with 25% fewer sleep interruptions due to Rt gluteal without the use of pain medication    Time  4    Period  Weeks    Status  New    Target Date  05/09/19        PT Long Term Goals - 04/11/19 1232      PT LONG TERM GOAL #1   Title  be independent in advanced HEP    Time  8    Period  Weeks    Status  New    Target Date  06/06/19      PT LONG TERM GOAL #2   Title  reduce FOTO to < or = to 23% limitation    Time  8    Period  Weeks    Status  New    Target Date  06/06/19      PT LONG TERM GOAL #3   Title  report < or = to 2/10 Rt LE pain at night to allow for sleep without limitation    Time  8    Period  Weeks    Status  New    Target Date  06/06/19      PT LONG TERM GOAL #4   Title  sit to read without limitation due to pain    Time  8    Period  Weeks    Status  New    Target Date  06/06/19            Plan - 04/11/19 1245    Clinical Impression Statement  Pt presents to PT with Rt LE pain that began 2 months ago without cause.  Pt does not have much pain during the day and reports 8-9/10 pain that is present at night with sleep and 5/10 pain with sitting for reading.  Pt is active and walks 2 miles a day and exercises regularly and is not limited by this due to pain.  Pt with antalgic gait and Rt pelvic shift in standing.  Pt with forward head and flexed trunk in standing.  Pt with normal lumbar A/ROM and hip flexibility.  Pt with tension over Rt lateral gluteals and ITB.  Pt will benefit from skilled PT to address Rt LE pain that is limited sleep and ability to sit for reading.    Personal Factors and Comorbidities  Age;Comorbidity 1    Examination-Activity Limitations  Sleep;Sit    Stability/Clinical Decision Making  Stable/Uncomplicated    Clinical Decision Making  Low    Rehab Potential  Excellent    PT Frequency  2x / week    PT Duration  8 weeks    PT Treatment/Interventions  ADLs/Self Care Home  Management;Cryotherapy;Electrical Stimulation;Moist Heat;Functional mobility training;Therapeutic activities;Therapeutic exercise;Patient/family education;Neuromuscular re-education;Manual techniques;Passive range of motion;Taping;Dry needling    PT Next Visit Plan  review HEP, dry needling to Rt gluteals/ITB if pt agrees, gluteal strength    PT Home Exercise Plan  Access Code: [email or web address]    Consulted and Agree with Plan of Care  Patient       Patient will benefit from skilled therapeutic intervention in order to improve the following deficits and impairments:  Abnormal gait, Postural dysfunction, Pain, Decreased activity tolerance, Increased muscle spasms, Decreased endurance  Visit Diagnosis: Chronic right-sided low back pain with right-sided sciatica - Plan: PT plan of care cert/re-cert  Abnormal posture - Plan: PT plan of care cert/re-cert  Cramp and spasm - Plan: PT plan of care cert/re-cert     Problem List Patient Active Problem List   Diagnosis Date Noted  . Port-A-Cath in place 01/28/2018  . Genetic testing 10/18/2017  . Family history of cancer   . History of breast cancer   . Breast cancer of upper-outer quadrant of right female breast ([place]) 10/08/2017  . Osteopenia 09/21/2015  . Rapid heart rate 04/01/2015  . Atrial fibrillation ([place]) 04/01/2015  . Diabetes 1.5, managed as type 2 ([place]) 09/04/2014  . Breast cancer, left breast ([place]) 08/30/2012  . Influenza 08/04/2011  . Gout 06/01/2011  . Vaginitis, atrophic 06/01/2011  . RENAL INSUFFICIENCY, ACUTE 05/28/2010  . HEMATURIA, HX OF 05/28/2010  . Disorder of bone and cartilage 12/08/2007  . Hyperlipidemia 08/10/2007  . Essential hypertension 08/10/2007     Sigurd Sos, PT 04/11/19 12:52 PM  [facility] [address], [place], [place], [address] Phone: [phone]   Fax:  [phone]  Name: Adrienne [name] MRN: [id number] Date of Birth:  [birth year]

## 2019-04-11 NOTE — Patient Instructions (Signed)
Access Code: [email or web address]  URL: [email or web address]  Date: 04/11/2019  Prepared by: Sigurd Sos   Exercises Hooklying Single Knee to Chest - 3 reps - 1 sets - 20 hold - 3x daily - 7x weekly Seated Hamstring Stretch - 3 reps - 1 sets - 20 hold - 3x daily - 7x weekly Supine Piriformis Stretch - 3 reps - 20 hold - 3x daily - 7x weekly

## 2019-04-12 ENCOUNTER — Other Ambulatory Visit: Payer: Self-pay | Admitting: Pharmacist Clinician (PhC)/ Clinical Pharmacy Specialist

## 2019-04-12 MED ORDER — APIXABAN 2.5 MG PO TABS: 2.5000 mg | ORAL_TABLET | Freq: Two times a day (BID) | ORAL | 1 refills | Status: DC

## 2019-04-12 NOTE — Telephone Encounter (Signed)
Reviewed information.  Spoke with patient, she is aware of dose decrease to 2.5 mg bid dose.

## 2019-04-12 NOTE — Telephone Encounter (Signed)
Patient weight < 60 kg, age > 60.  Will need to decrease dose to 2.5 mg bid.  Patient aware.

## 2019-04-13 ENCOUNTER — Other Ambulatory Visit: Payer: Self-pay

## 2019-04-13 ENCOUNTER — Ambulatory Visit: Payer: Medicare Other | Admitting: Physical Therapy

## 2019-04-13 DIAGNOSIS — M5441 Lumbago with sciatica, right side: Secondary | ICD-10-CM | POA: Diagnosis not present

## 2019-04-13 DIAGNOSIS — R293 Abnormal posture: Secondary | ICD-10-CM

## 2019-04-13 DIAGNOSIS — R252 Cramp and spasm: Secondary | ICD-10-CM

## 2019-04-13 NOTE — Patient Instructions (Signed)
Access Code: [email or web address]  URL: [email or web address]  Date: 04/13/2019  Prepared by: Ruben Im   Exercises Hooklying Single Knee to Chest - 3 reps - 1 sets - 20 hold - 3x daily - 7x weekly Seated Hamstring Stretch - 3 reps - 1 sets - 20 hold - 3x daily - 7x weekly Supine Piriformis Stretch - 3 reps - 20 hold - 3x daily - 7x weekly Clamshell - 10 reps - 1 sets - 1x daily - 7x weekly Supine Hamstring Stretch with Strap - 10 reps - 3 sets - 1x daily - 7x weekly Supine Hip Adduction Isometric with Ball - 10 reps - 1 sets - 1x daily - 7x weekly

## 2019-04-13 NOTE — Therapy (Signed)
[facility] [address], [place], [place], [address] Phone: [phone]   Fax:  [phone]  Physical Therapy Treatment  Patient Details  Name: Adrienne [name] MRN: [id number] Date of Birth: [birth year] Referring Provider (PT): Jean Rosenthal, MD   Encounter Date: 04/13/2019  PT End of Session - 04/13/19 1404    Visit Number  2    Date for PT Re-Evaluation  06/06/19    Authorization Type  Medicare    PT Start Time  1230    PT Stop Time  1310    PT Time Calculation (min)  40 min    Activity Tolerance  Patient tolerated treatment well       Past Medical History:  Diagnosis Date  . Allergy   . Atrial fibrillation ([place])   . Breast cancer ([place]) 08/25/12   Invasive ductal ca,DCIS  . Diabetes mellitus    type II  . Family history of cancer   . Gout   . Hearing loss   . History of breast cancer   . History of frequent urinary tract infections   . History of radiation therapy 03/21/18- 04/19/18   Right Breast 2.67 Gy X 15 fractions, right breast boost 2 Gy X 5 fractions.   Marland Kitchen Hx of radiation therapy 11/22/12- 12/19/12   breast 4250 cGy 17 sessions, left breast boost 750 cGy 3 sessions  . Hyperlipidemia   . Hypertension   . Osteopenia     Past Surgical History:  Procedure Laterality Date  . ABDOMINAL HYSTERECTOMY Bilateral 1999   w/b/l salpingo-oopherectomy  . APPENDECTOMY    . BREAST LUMPECTOMY WITH NEEDLE LOCALIZATION AND AXILLARY SENTINEL LYMPH NODE BX Left 09/12/2012   Procedure: LEFT NEEDLE LOCALIZATION BREAST LUMPECTOMY TION AND AXILLARY SENTINEL LYMPH NODE BX;  Surgeon: Edward Jolly, MD;  Location: [facility];  Service: General;  Laterality: Left;  . BREAST LUMPECTOMY WITH RADIOACTIVE SEED AND SENTINEL LYMPH NODE BIOPSY Right 10/26/2017   Procedure: BREAST LUMPECTOMY WITH RADIOACTIVE SEED AND SENTINEL LYMPH NODE BIOPSY;  Surgeon: Excell Seltzer, MD;  Location: [facility];  Service:  General;  Laterality: Right;  . BREAST SURGERY  1990's   right- fibroid cyst  . CHOLECYSTECTOMY    . SEPTOPLASTY    . TONSILLECTOMY      There were no vitals filed for this visit.  Subjective Assessment - 04/13/19 1233    Subjective  That stretch where I pull that knee up and across hurts.  No pain with walking, sitting hurts.                       [facility] PT Treatment/Exercise - 04/13/19 0001      Lumbar Exercises: Stretches   Active Hamstring Stretch  Right;Left;3 reps;30 seconds    Active Hamstring Stretch Limitations  with stap with bias     Other Lumbar Stretch Exercise  review of initial HEP       Lumbar Exercises: Supine   Bridge Limitations  discontinued secondary to pain     Other Supine Lumbar Exercises  ball squeezes 5 sec hold 15x       Lumbar Exercises: Sidelying   Clam  Right;10 reps      Manual Therapy   Manual therapy comments  piriformis contract relax 3x 5 sec holds     Joint Mobilization  right hip long axis distraction, inferior, AP in internal rotation grade 3 3x 30 sec     Soft  tissue mobilization  Addaday instrument assisted soft tissue to gluteals and ITB                PT Short Term Goals - 04/11/19 1208      PT SHORT TERM GOAL #1   Title  be independent in initial HEP    Time  4    Period  Weeks    Status  New    Target Date  05/09/19      PT SHORT TERM GOAL #2   Title  sleep with 25% fewer sleep interruptions due to Rt gluteal without the use of pain medication    Time  4    Period  Weeks    Status  New    Target Date  05/09/19        PT Long Term Goals - 04/11/19 1232      PT LONG TERM GOAL #1   Title  be independent in advanced HEP    Time  8    Period  Weeks    Status  New    Target Date  06/06/19      PT LONG TERM GOAL #2   Title  reduce FOTO to < or = to 23% limitation    Time  8    Period  Weeks    Status  New    Target Date  06/06/19      PT LONG TERM GOAL #3   Title  report < or = to  2/10 Rt LE pain at night to allow for sleep without limitation    Time  8    Period  Weeks    Status  New    Target Date  06/06/19      PT LONG TERM GOAL #4   Title  sit to read without limitation due to pain    Time  8    Period  Weeks    Status  New    Target Date  06/06/19            Plan - 04/13/19 2048    Clinical Impression Statement  The patient has a positive response to manual techniques including soft tissue mobilization and hip mobilization.  Modified exercises as needed for pain.  Discomfort with bridging and initial piriformis stretch.  She may benefit from dry needling in the future.  Therapist closely monitoring response with all treatment interventions.    Rehab Potential  Excellent    PT Frequency  2x / week    PT Duration  8 weeks    PT Treatment/Interventions  ADLs/Self Care Home Management;Cryotherapy;Electrical Stimulation;Moist Heat;Functional mobility training;Therapeutic activities;Therapeutic exercise;Patient/family education;Neuromuscular re-education;Manual techniques;Passive range of motion;Taping;Dry needling    PT Next Visit Plan  review HEP, dry needling to Rt gluteals/ITB if pt agrees, gluteal strength    PT Home Exercise Plan  Access Code: [email or web address]       Patient will benefit from skilled therapeutic intervention in order to improve the following deficits and impairments:  Abnormal gait, Postural dysfunction, Pain, Decreased activity tolerance, Increased muscle spasms, Decreased endurance  Visit Diagnosis: Chronic right-sided low back pain with right-sided sciatica  Abnormal posture  Cramp and spasm     Problem List Patient Active Problem List   Diagnosis Date Noted  . Port-A-Cath in place 01/28/2018  . Genetic testing 10/18/2017  . Family history of cancer   . History of breast cancer   . Breast cancer of upper-outer quadrant of right female breast ([place]) 10/08/2017  .  Osteopenia 09/21/2015  . Rapid heart rate 04/01/2015  . Atrial  fibrillation ([place]) 04/01/2015  . Diabetes 1.5, managed as type 2 ([place]) 09/04/2014  . Breast cancer, left breast ([place]) 08/30/2012  . Influenza 08/04/2011  . Gout 06/01/2011  . Vaginitis, atrophic 06/01/2011  . RENAL INSUFFICIENCY, ACUTE 05/28/2010  . HEMATURIA, HX OF 05/28/2010  . Disorder of bone and cartilage 12/08/2007  . Hyperlipidemia 08/10/2007  . Essential hypertension 08/10/2007   Ruben Im, PT 04/13/19 8:55 PM Phone: [phone] Fax: [phone] Alvera Singh 04/13/2019, 8:54 PM  [facility] [address] [place], [place], [address] Phone: [phone]   Fax:  [phone]  Name: Adrienne [name] MRN: [id number] Date of Birth: [birth year]

## 2019-04-18 ENCOUNTER — Other Ambulatory Visit: Payer: Self-pay

## 2019-04-18 ENCOUNTER — Ambulatory Visit: Payer: Medicare Other

## 2019-04-18 DIAGNOSIS — M5441 Lumbago with sciatica, right side: Secondary | ICD-10-CM

## 2019-04-18 DIAGNOSIS — R293 Abnormal posture: Secondary | ICD-10-CM

## 2019-04-18 DIAGNOSIS — R252 Cramp and spasm: Secondary | ICD-10-CM

## 2019-04-18 DIAGNOSIS — G8929 Other chronic pain: Secondary | ICD-10-CM

## 2019-04-18 NOTE — Therapy (Signed)
[facility] [address], [place], [place], [address] Phone: [phone]   Fax:  [phone]  Physical Therapy Treatment  Patient Details  Name: Adrienne [name] MRN: [id number] Date of Birth: [birth year] Referring Provider (PT): Jean Rosenthal, MD   Encounter Date: 04/18/2019  PT End of Session - 04/18/19 1051    Visit Number  3    Date for PT Re-Evaluation  06/06/19    Authorization Type  Medicare    PT Start Time  1016    PT Stop Time  1059    PT Time Calculation (min)  43 min    Activity Tolerance  Patient tolerated treatment well    Behavior During Therapy  [facility] for tasks assessed/performed       Past Medical History:  Diagnosis Date  . Allergy   . Atrial fibrillation ([place])   . Breast cancer ([place]) 08/25/12   Invasive ductal ca,DCIS  . Diabetes mellitus    type II  . Family history of cancer   . Gout   . Hearing loss   . History of breast cancer   . History of frequent urinary tract infections   . History of radiation therapy 03/21/18- 04/19/18   Right Breast 2.67 Gy X 15 fractions, right breast boost 2 Gy X 5 fractions.   Marland Kitchen Hx of radiation therapy 11/22/12- 12/19/12   breast 4250 cGy 17 sessions, left breast boost 750 cGy 3 sessions  . Hyperlipidemia   . Hypertension   . Osteopenia     Past Surgical History:  Procedure Laterality Date  . ABDOMINAL HYSTERECTOMY Bilateral 1999   w/b/l salpingo-oopherectomy  . APPENDECTOMY    . BREAST LUMPECTOMY WITH NEEDLE LOCALIZATION AND AXILLARY SENTINEL LYMPH NODE BX Left 09/12/2012   Procedure: LEFT NEEDLE LOCALIZATION BREAST LUMPECTOMY TION AND AXILLARY SENTINEL LYMPH NODE BX;  Surgeon: Edward Jolly, MD;  Location: [place];  Service: General;  Laterality: Left;  . BREAST LUMPECTOMY WITH RADIOACTIVE SEED AND SENTINEL LYMPH NODE BIOPSY Right 10/26/2017   Procedure: BREAST LUMPECTOMY WITH RADIOACTIVE SEED AND SENTINEL LYMPH NODE BIOPSY;  Surgeon: Excell Seltzer, MD;  Location: [place];  Service: General;  Laterality: Right;  . BREAST SURGERY  1990's   right- fibroid cyst  . CHOLECYSTECTOMY    . SEPTOPLASTY    . TONSILLECTOMY      There were no vitals filed for this visit.  Subjective Assessment - 04/18/19 1021    Subjective  I am not doing well.  My husband was in the hospital and is now being moved to Hospice care.    Diagnostic tests  x-ray:  lumbar DDD at multiple levels and spondylolisthesis at L4 on L5 (grade 1)    Currently in Pain?  Yes    Pain Score  0-No pain   6-7/10 at night   Pain Location  Buttocks    Pain Orientation  Right    Pain Descriptors / Indicators  Aching;Burning;Shooting    Pain Type  Chronic pain    Pain Onset  More than a month ago    Pain Frequency  Intermittent    Aggravating Factors   sleep at night, sitting to read    Pain Relieving Factors  changing position, getting up to move around                       [facility] Adult PT Treatment/Exercise - 04/18/19 0001      Exercises  Exercises  Lumbar;Knee/Hip      Lumbar Exercises: Stretches   Active Hamstring Stretch  Right;Left;3 reps;30 seconds    Active Hamstring Stretch Limitations  with stap with bias     Single Knee to Chest Stretch  3 reps;20 seconds    Single Knee to Chest Stretch Limitations  slight adduction bias       Lumbar Exercises: Supine   Clam  20 reps    Clam Limitations  yellow band and abdominal bracing    Other Supine Lumbar Exercises  ball squeezes 5 sec hold 2x10      Lumbar Exercises: Sidelying   Clam  Right;10 reps      Manual Therapy   Soft tissue mobilization  Addaday instrument assisted soft tissue to gluteals and ITB                PT Short Term Goals - 04/11/19 1208      PT SHORT TERM GOAL #1   Title  be independent in initial HEP    Time  4    Period  Weeks    Status  New    Target Date  05/09/19      PT SHORT TERM GOAL #2   Title  sleep with 25% fewer sleep  interruptions due to Rt gluteal without the use of pain medication    Time  4    Period  Weeks    Status  New    Target Date  05/09/19        PT Long Term Goals - 04/11/19 1232      PT LONG TERM GOAL #1   Title  be independent in advanced HEP    Time  8    Period  Weeks    Status  New    Target Date  06/06/19      PT LONG TERM GOAL #2   Title  reduce FOTO to < or = to 23% limitation    Time  8    Period  Weeks    Status  New    Target Date  06/06/19      PT LONG TERM GOAL #3   Title  report < or = to 2/10 Rt LE pain at night to allow for sleep without limitation    Time  8    Period  Weeks    Status  New    Target Date  06/06/19      PT LONG TERM GOAL #4   Title  sit to read without limitation due to pain    Time  8    Period  Weeks    Status  New    Target Date  06/06/19            Plan - 04/18/19 1034    Clinical Impression Statement  Pt reports that she felt better after last session and the Addaday helped a lot.  Pt remains active with walking and exercise at home.  Rt LE pain is less at night with sleep now. Pt performed all exercise in the clinic without difficulty or increased pain.  Pt required minor verbal and tactile cues for technique.  Pt with tension in Rt gluteals and proximal ITB and reported improved mobility after manual therapy today.  Pt will continue to benefit from skilled PT for strength, flexibility and manual to address Rt gluteal/leg pain.    PT Frequency  2x / week    PT Duration  8 weeks  PT Treatment/Interventions  ADLs/Self Care Home Management;Cryotherapy;Electrical Stimulation;Moist Heat;Functional mobility training;Therapeutic activities;Therapeutic exercise;Patient/family education;Neuromuscular re-education;Manual techniques;Passive range of motion;Taping;Dry needling    PT Next Visit Plan  gluteal strength, flexbility, manual    PT Home Exercise Plan  Access Code: [email or web address]    Recommended Other Services  initial certificatin is  signed       Patient will benefit from skilled therapeutic intervention in order to improve the following deficits and impairments:  Abnormal gait, Postural dysfunction, Pain, Decreased activity tolerance, Increased muscle spasms, Decreased endurance  Visit Diagnosis: Chronic right-sided low back pain with right-sided sciatica  Cramp and spasm  Abnormal posture     Problem List Patient Active Problem List   Diagnosis Date Noted  . Port-A-Cath in place 01/28/2018  . Genetic testing 10/18/2017  . Family history of cancer   . History of breast cancer   . Breast cancer of upper-outer quadrant of right female breast ([place]) 10/08/2017  . Osteopenia 09/21/2015  . Rapid heart rate 04/01/2015  . Atrial fibrillation ([place]) 04/01/2015  . Diabetes 1.5, managed as type 2 ([place]) 09/04/2014  . Breast cancer, left breast ([place]) 08/30/2012  . Influenza 08/04/2011  . Gout 06/01/2011  . Vaginitis, atrophic 06/01/2011  . RENAL INSUFFICIENCY, ACUTE 05/28/2010  . HEMATURIA, HX OF 05/28/2010  . Disorder of bone and cartilage 12/08/2007  . Hyperlipidemia 08/10/2007  . Essential hypertension 08/10/2007     Sigurd Sos, PT 04/18/19 11:00 AM  [facility] [address], [place], [place], [address] Phone: [phone]   Fax:  [phone]  Name: Adrienne [name] MRN: [id number] Date of Birth: 12/10/1934

## 2019-04-20 ENCOUNTER — Ambulatory Visit: Payer: Medicare Other | Admitting: Physical Therapy

## 2019-04-20 ENCOUNTER — Encounter: Payer: Self-pay | Admitting: Adult Health

## 2019-04-20 ENCOUNTER — Ambulatory Visit: Payer: Medicare Other | Admitting: Adult Health

## 2019-04-20 ENCOUNTER — Other Ambulatory Visit: Payer: Self-pay

## 2019-04-20 VITALS — BP 126/72 | Temp 97.6°F | Wt 125.0 lb

## 2019-04-20 DIAGNOSIS — E139 Other specified diabetes mellitus without complications: Secondary | ICD-10-CM | POA: Diagnosis not present

## 2019-04-20 LAB — POCT GLYCOSYLATED HEMOGLOBIN (HGB A1C): HbA1c, POC (controlled diabetic range): 6.7 % (ref 0.0–7.0)

## 2019-04-20 NOTE — Progress Notes (Signed)
Subjective:    Patient ID: Adrienne [name], female    DOB: [birth year], 84 y.o.   MRN: [id number]  HPI 84 year old female who  has a past medical history of Allergy, Atrial fibrillation ([place]), Breast cancer ([place]) (08/25/12), Diabetes mellitus, Family history of cancer, Gout, Hearing loss, History of breast cancer, History of frequent urinary tract infections, History of radiation therapy (03/21/18- 04/19/18), radiation therapy (11/22/12- 12/19/12), Hyperlipidemia, Hypertension, and Osteopenia.  She presents to the office today for follow up regarding DM. She is currently maintained on Metformin 500 mg BID. She has been monitoring her BS at home and reports that her sugars have been between 120-150. She is walking 2.5 miles a day. She has not experienced any hypoglycemic episodes.   Lab Results  Component Value Date   HGBA1C 7.1 (H) 12/21/2018     Review of Systems See HPI   Past Medical History:  Diagnosis Date  . Allergy   . Atrial fibrillation ([place])   . Breast cancer ([place]) 08/25/12   Invasive ductal ca,DCIS  . Diabetes mellitus    type II  . Family history of cancer   . Gout   . Hearing loss   . History of breast cancer   . History of frequent urinary tract infections   . History of radiation therapy 03/21/18- 04/19/18   Right Breast 2.67 Gy X 15 fractions, right breast boost 2 Gy X 5 fractions.   Marland Kitchen Hx of radiation therapy 11/22/12- 12/19/12   breast 4250 cGy 17 sessions, left breast boost 750 cGy 3 sessions  . Hyperlipidemia   . Hypertension   . Osteopenia     Social History   Socioeconomic History  . Marital status: Married    Spouse name: Not on file  . Number of children: 3  . Years of education: Not on file  . Highest education level: Not on file  Occupational History  . Not on file  Tobacco Use  . Smoking status: Former Smoker    Packs/day: 0.25    Years: 4.00    Pack years: 1.00    Types: Cigarettes  . Smokeless tobacco: Never Used  . Tobacco comment:  Cigarette use was 50 years ago  Substance and Sexual Activity  . Alcohol use: Not Currently  . Drug use: No  . Sexual activity: Never    Comment: menarche age 18, 37st birth age 65, G59, HRT x 7 years?  Other Topics Concern  . Not on file  Social History Narrative   Married - husband in memory care unit    Three children ( One lives at [place] and two in [place]      She likes to read and go to [employer]    Social Determinants of Health   Financial Resource Strain:   . Difficulty of Paying Living Expenses: Not on file  Food Insecurity:   . Worried About Charity fundraiser in the Last Year: Not on file  . Ran Out of Food in the Last Year: Not on file  Transportation Needs:   . Lack of Transportation (Medical): Not on file  . Lack of Transportation (Non-Medical): Not on file  Physical Activity:   . Days of Exercise per Week: Not on file  . Minutes of Exercise per Session: Not on file  Stress:   . Feeling of Stress : Not on file  Social Connections:   . Frequency of Communication with Friends and Family: Not on file  . Frequency of  Social Gatherings with Friends and Family: Not on file  . Attends Religious Services: Not on file  . Active Member of Clubs or Organizations: Not on file  . Attends Archivist Meetings: Not on file  . Marital Status: Not on file  Intimate Partner Violence: Not At Risk  . Fear of Current or Ex-Partner: No  . Emotionally Abused: No  . Physically Abused: No  . Sexually Abused: No    Past Surgical History:  Procedure Laterality Date  . ABDOMINAL HYSTERECTOMY Bilateral 1999   w/b/l salpingo-oopherectomy  . APPENDECTOMY    . BREAST LUMPECTOMY WITH NEEDLE LOCALIZATION AND AXILLARY SENTINEL LYMPH NODE BX Left 09/12/2012   Procedure: LEFT NEEDLE LOCALIZATION BREAST LUMPECTOMY TION AND AXILLARY SENTINEL LYMPH NODE BX;  Surgeon: Edward Jolly, MD;  Location: [place];  Service: General;  Laterality: Left;  . BREAST LUMPECTOMY WITH  RADIOACTIVE SEED AND SENTINEL LYMPH NODE BIOPSY Right 10/26/2017   Procedure: BREAST LUMPECTOMY WITH RADIOACTIVE SEED AND SENTINEL LYMPH NODE BIOPSY;  Surgeon: Excell Seltzer, MD;  Location: [place];  Service: General;  Laterality: Right;  . BREAST SURGERY  1990's   right- fibroid cyst  . CHOLECYSTECTOMY    . SEPTOPLASTY    . TONSILLECTOMY      Family History  Problem Relation Age of Onset  . Hypertension Mother   . Heart disease Mother        Murmur and irregular heart disease  . Dementia Mother        d. 31  . Hypertension Father   . Aneurysm Father 57  . Breast cancer Cousin 72       mat first cousin    Allergies  Allergen Reactions  . Penicillins Hives    Has patient had a PCN reaction causing immediate rash, facial/tongue/throat swelling, SOB or lightheadedness with hypotension: No Has patient had a PCN reaction causing severe rash involving mucus membranes or skin necrosis: No Has patient had a PCN reaction that required hospitalization: No Has patient had a PCN reaction occurring within the last 10 years: No If all of the above answers are "NO", then may proceed with Cephalosporin use.  . Other Hives  . Prednisone Other (See Comments)    ELEVATED BLOOD SUGAR  . Radiaplexrx [Woun'Dres Hydrogel Wound Dress] Hives  . Sulfamethoxazole Nausea Only    Current Outpatient Medications on File Prior to Visit  Medication Sig Dispense Refill  . allopurinol (ZYLOPRIM) 300 MG tablet TAKE 1 TABLET BY MOUTH EVERY DAY 90 tablet 0  . apixaban (ELIQUIS) 2.5 MG TABS tablet Take 1 tablet (2.5 mg total) by mouth 2 (two) times daily. 180 tablet 1  . atenolol (TENORMIN) 25 MG tablet TAKE 1 TABLET BY MOUTH EVERY DAY 90 tablet 1  . Blood Glucose Monitoring Suppl (BLOOD GLUCOSE MONITOR SYSTEM) w/Device KIT Accu Check Aviva Meter 1 kit 0  . chlorpheniramine (CHLOR-TRIMETON) 4 MG tablet Take 4 mg 2 (two) times daily as needed by mouth for allergies.    Marland Kitchen diltiazem (CARDIZEM  CD) 360 MG 24 hr capsule Take 1 capsule (360 mg total) by mouth daily. 90 capsule 1  . fish oil-omega-3 fatty acids 1000 MG capsule Take 1 g by mouth daily.    Marland Kitchen gabapentin (NEURONTIN) 100 MG capsule Take 1 capsule (100 mg total) by mouth at bedtime. 30 capsule 1  . glucose blood (ACCU-CHEK AVIVA PLUS) test strip USE TO TEST TWICE DAILY **ICD-10 CODE E13.9** 200 each 3  . lidocaine-prilocaine (EMLA) cream  Apply 1 application topically as needed. 30 g 3  . loperamide (IMODIUM) 2 MG capsule Take 2 mg by mouth as needed for diarrhea or loose stools.    . metFORMIN (GLUCOPHAGE) 500 MG tablet TAKE 1 TABLET BY MOUTH TWICE A DAY 180 tablet 0  . Multiple Vitamin (MULTIVITAMIN) tablet Take 1 tablet by mouth daily.    Marland Kitchen OVER THE COUNTER MEDICATION Take by mouth 2 (two) times daily. Presavision- for macular degeneration prevention    . TOBREX 0.3 % ophthalmic ointment APPLY A SMALL AMOUNT ON EYELID THREE TIMES A DAY AS NEEDED    . VITAMIN D, ERGOCALCIFEROL, PO Take by mouth.     No current facility-administered medications on file prior to visit.    BP 126/72   Temp 97.6 F (36.4 C) (Temporal)   Wt 125 lb (56.7 kg)   BMI 23.62 kg/m       Objective:   Physical Exam Vitals and nursing note reviewed.  Constitutional:      Appearance: Normal appearance.  Cardiovascular:     Rate and Rhythm: Normal rate. Rhythm irregular.     Pulses: Normal pulses.     Heart sounds: Normal heart sounds.  Skin:    General: Skin is warm and dry.  Neurological:     General: No focal deficit present.     Mental Status: She is alert and oriented to person, place, and time.       Assessment & Plan:  1. Diabetes 1.5, managed as type 2 ([place])  - POCT A1C- 6.7  - No changes in medication  - Follow up in three months for CPE

## 2019-04-20 NOTE — Patient Instructions (Signed)
Your A1c is 6.7   Please follow up with me for your physical in three months

## 2019-04-24 ENCOUNTER — Other Ambulatory Visit: Payer: Self-pay

## 2019-04-24 ENCOUNTER — Ambulatory Visit: Payer: Medicare Other | Admitting: Orthopaedic Surgery

## 2019-04-24 ENCOUNTER — Encounter: Payer: Self-pay | Admitting: Orthopaedic Surgery

## 2019-04-24 DIAGNOSIS — M5431 Sciatica, right side: Secondary | ICD-10-CM

## 2019-04-24 MED ORDER — GABAPENTIN 100 MG PO CAPS: 100.0000 mg | ORAL_CAPSULE | Freq: Every day | ORAL | 3 refills | Status: DC

## 2019-04-24 NOTE — Progress Notes (Signed)
The patient is an 84 year old female who is following up with sciatic symptoms.  We put her on Neurontin and she said that is helped at bedtime.  She is only on 100 mg at night.  She has had at least 3 physical therapy sessions as well.  She reports that she has been better since therapy.  She still denies any groin pain or weakness in her legs.  She is walking without any assistive device.  On exam she has just some mild pain over the sciatic and trochanteric area on the right side but excellent range of motion of both hips and knees as well as feet and ankles.  She is got good strength bilaterally.  At this point I will continue the Neurontin since that is helpful for her.  She will continue therapy for few more sessions.  All question concerns were answered and addressed.  At this point since she is making improvements follow-up can be as needed.

## 2019-04-25 ENCOUNTER — Ambulatory Visit: Payer: Medicare Other

## 2019-04-25 DIAGNOSIS — R293 Abnormal posture: Secondary | ICD-10-CM

## 2019-04-25 DIAGNOSIS — G8929 Other chronic pain: Secondary | ICD-10-CM

## 2019-04-25 DIAGNOSIS — M5441 Lumbago with sciatica, right side: Secondary | ICD-10-CM | POA: Diagnosis not present

## 2019-04-25 DIAGNOSIS — R252 Cramp and spasm: Secondary | ICD-10-CM

## 2019-04-25 NOTE — Therapy (Signed)
[facility] [address], [place], [place], [address] Phone: [phone]   Fax:  [phone]  Physical Therapy Treatment  Patient Details  Name: Adrienne [name] MRN: [id number] Date of Birth: [birth year] Referring Provider (PT): Jean Rosenthal, MD   Encounter Date: 04/25/2019  PT End of Session - 04/25/19 0933    Visit Number  4    Date for PT Re-Evaluation  06/06/19    Authorization Type  Medicare    PT Start Time  0846    PT Stop Time  0932    PT Time Calculation (min)  46 min    Activity Tolerance  Patient tolerated treatment well    Behavior During Therapy  [facility] for tasks assessed/performed       Past Medical History:  Diagnosis Date  . Allergy   . Atrial fibrillation ([place])   . Breast cancer ([place]) 08/25/12   Invasive ductal ca,DCIS  . Diabetes mellitus    type II  . Family history of cancer   . Gout   . Hearing loss   . History of breast cancer   . History of frequent urinary tract infections   . History of radiation therapy 03/21/18- 04/19/18   Right Breast 2.67 Gy X 15 fractions, right breast boost 2 Gy X 5 fractions.   Marland Kitchen Hx of radiation therapy 11/22/12- 12/19/12   breast 4250 cGy 17 sessions, left breast boost 750 cGy 3 sessions  . Hyperlipidemia   . Hypertension   . Osteopenia     Past Surgical History:  Procedure Laterality Date  . ABDOMINAL HYSTERECTOMY Bilateral 1999   w/b/l salpingo-oopherectomy  . APPENDECTOMY    . BREAST LUMPECTOMY WITH NEEDLE LOCALIZATION AND AXILLARY SENTINEL LYMPH NODE BX Left 09/12/2012   Procedure: LEFT NEEDLE LOCALIZATION BREAST LUMPECTOMY TION AND AXILLARY SENTINEL LYMPH NODE BX;  Surgeon: Edward Jolly, MD;  Location: [place];  Service: General;  Laterality: Left;  . BREAST LUMPECTOMY WITH RADIOACTIVE SEED AND SENTINEL LYMPH NODE BIOPSY Right 10/26/2017   Procedure: BREAST LUMPECTOMY WITH RADIOACTIVE SEED AND SENTINEL LYMPH NODE BIOPSY;  Surgeon: Excell Seltzer, MD;  Location: [place];  Service: General;  Laterality: Right;  . BREAST SURGERY  1990's   right- fibroid cyst  . CHOLECYSTECTOMY    . SEPTOPLASTY    . TONSILLECTOMY      There were no vitals filed for this visit.  Subjective Assessment - 04/25/19 0851    Subjective  My husband passed away so I have been having a hard time.    Currently in Pain?  No/denies                       [facility] Adult PT Treatment/Exercise - 04/25/19 0001      Lumbar Exercises: Stretches   Active Hamstring Stretch  Right;Left;3 reps;30 seconds    Active Hamstring Stretch Limitations  with stap with bias     Single Knee to Chest Stretch  3 reps;20 seconds    Single Knee to Chest Stretch Limitations  slight adduction bias       Lumbar Exercises: Supine   Other Supine Lumbar Exercises  ball squeezes 5 sec hold 2x10      Knee/Hip Exercises: Aerobic   Nustep  Level 2x 8 minutes      Knee/Hip Exercises: Standing   Hip Abduction  Stengthening;Both;2 sets;10 reps    Hip Extension  Stengthening;Both;2 sets;10 reps    Diplomatic Services operational officer  3 minutes    Rebounder  3 ways x 1 minute each      Knee/Hip Exercises: Seated   Long Arc Quad  Strengthening;Both;2 sets;10 reps      Manual Therapy   Soft tissue mobilization  Addaday instrument assisted soft tissue to gluteals and ITB                PT Short Term Goals - 04/25/19 0857      PT SHORT TERM GOAL #1   Title  be independent in initial HEP    Status  Achieved      PT SHORT TERM GOAL #2   Title  sleep with 25% fewer sleep interruptions due to Rt gluteal without the use of pain medication    Status  Achieved        PT Long Term Goals - 04/25/19 0857      PT LONG TERM GOAL #4   Title  sit to read without limitation due to pain    Baseline  when I sit up straight, I have less pain    Time  8    Period  Weeks    Status  On-going            Plan - 04/25/19 [id number]    Clinical Impression Statement  Pt  had a lapse in treatment due to death of her husband.  Pt remains active with walking and has not been to exercise as the gym at [facility] has closed due to covid.  Rt LE pain is less at night with sleep now that she is taking Neurotin. Pt performed all exercise in the clinic without difficulty or increased pain.  Pt required minor verbal and tactile cues for technique.  Pt with tension in Rt gluteals and proximal ITB and reported improved mobility after manual therapy today.  Pt will continue to benefit from skilled PT for strength, flexibility and manual to address Rt gluteal/leg pain.    PT Frequency  2x / week    PT Duration  8 weeks    PT Treatment/Interventions  ADLs/Self Care Home Management;Cryotherapy;Electrical Stimulation;Moist Heat;Functional mobility training;Therapeutic activities;Therapeutic exercise;Patient/family education;Neuromuscular re-education;Manual techniques;Passive range of motion;Taping;Dry needling    PT Next Visit Plan  gluteal strength, flexbility, manual    PT Home Exercise Plan  Access Code: [email or web address]    Consulted and Agree with Plan of Care  Patient       Patient will benefit from skilled therapeutic intervention in order to improve the following deficits and impairments:  Abnormal gait, Postural dysfunction, Pain, Decreased activity tolerance, Increased muscle spasms, Decreased endurance  Visit Diagnosis: Chronic right-sided low back pain with right-sided sciatica  Cramp and spasm  Abnormal posture     Problem List Patient Active Problem List   Diagnosis Date Noted  . Port-A-Cath in place 01/28/2018  . Genetic testing 10/18/2017  . Family history of cancer   . History of breast cancer   . Breast cancer of upper-outer quadrant of right female breast ([place]) 10/08/2017  . Osteopenia 09/21/2015  . Rapid heart rate 04/01/2015  . Atrial fibrillation ([place]) 04/01/2015  . Diabetes 1.5, managed as type 2 ([place]) 09/04/2014  . Breast cancer, left breast ([place])  08/30/2012  . Influenza 08/04/2011  . Gout 06/01/2011  . Vaginitis, atrophic 06/01/2011  . RENAL INSUFFICIENCY, ACUTE 05/28/2010  . HEMATURIA, HX OF 05/28/2010  . Disorder of bone and cartilage 12/08/2007  . Hyperlipidemia 08/10/2007  . Essential hypertension 08/10/2007     Claiborne Billings  Garnetta Buddy, PT 04/25/19 9:45 AM  [facility] [address], [place], [place], [address] Phone: [phone]   Fax:  [phone]  Name: Adrienne [name] MRN: [id number] Date of Birth: 1935/02/26

## 2019-04-26 ENCOUNTER — Other Ambulatory Visit: Payer: Medicare Other

## 2019-04-26 ENCOUNTER — Ambulatory Visit: Payer: Medicare Other | Admitting: Hematology

## 2019-05-01 ENCOUNTER — Other Ambulatory Visit: Payer: Self-pay

## 2019-05-01 DIAGNOSIS — C50411 Malignant neoplasm of upper-outer quadrant of right female breast: Secondary | ICD-10-CM

## 2019-05-01 DIAGNOSIS — Z171 Estrogen receptor negative status [ER-]: Secondary | ICD-10-CM

## 2019-05-01 NOTE — Progress Notes (Addendum)
[name]   [phone] Fax:([phone]   Clinic Follow up Note   Patient Care Team: Adrienne Peng, NP as PCP - General (Family Medicine) Adrienne [name] Adrienne Bors, MD as PCP - Cardiology (Cardiology)  Date of Service:  05/02/2019  CHIEF COMPLAINT: Follow up for right breast cancer, triple negative   SUMMARY OF ONCOLOGIC HISTORY: Oncology History Overview Note  Cancer Staging Breast cancer of upper-outer quadrant of right female breast [facility]) Staging form: Breast, AJCC 8th Edition - Clinical stage from 09/22/2017: Stage IB (cT1b, cN0, cM0, G2, ER-, PR-, HER2-) - Signed by Truitt Merle, MD on 10/08/2017  Breast cancer, left breast [facility]) Staging form: Breast, AJCC 7th Edition - Clinical stage from 09/12/2012: Stage IA (T1b, N0, M0) - Unsigned      Breast cancer, left breast ([place])  08/25/2012 Receptors her2   ER 100% positive, PR 88% positive, HER-2 negative, Ki-67 16%   08/30/2012 Initial Diagnosis   Breast cancer, left breast   09/12/2012 Pathology Results   T1bN0 Grade 1 invasive ductal carcinoma, and DCIS.   09/12/2012 Surgery   Left breast lumpectomy and sentinel lymph node biopsy, negative margins.   11/09/2012 - 12/13/2012 Radiation Therapy   Adjuvant breast radiation   12/2012 - 11/2017 Anti-estrogen oral therapy   Anastrozole 1 mg daily, switched to Aromasin 25 mg daily in Jan 2015 due to diarrhea. Her Exemestane was switched to letrozole in 08/2017 due to high copay. She completed her 5 years in 11/2017.    09/01/2016 Imaging   [name] Outside films Breast form [facility] 09/01/2016 IMPRESSION: Probably Benign 1. No significant change in oval hypoechoic masses in the left breast at 11:00 2 cm from the nipple and 10:30 4 cm from the nipple. Both of these masses resemble the area of fat necrosis previously biopsies at 2:00. In addition, both masses have developing central benign or dystrophic appearing calcifications and are favored to be other areas of fat necrosis. A  6 month follow-up left breast mammogram and ultrasound is recommended.   09/01/2016 Mammogram   [facility] Mammogram from [facility] 09/01/16 IMPRESSION  Incomplete - additional imaging evaluation needed Ultrasound of the upper medial left breast mass is recommended.    03/09/2017 Mammogram   Previous lumpectomy changes in the upper outer left breast anterior depth with stable associated dystrophic calcifications.  Biopsy clip at the 2:00 in the left breast near the lumpectomy site corresponding to previously biopsied benign fat necrosis.  Persistent round mass in the upper medial left breast with benign-appearing central round calcification.  No significant masses, calcifications, or other findings are seen in the breast  Impression: ultrasound is recommended for the left breast   03/09/2017 Breast US   Left breast ultrasound impression:  No significant change in oval hypoechoic masses in the left breast at 11:00 2 cm from the nipple and 10:30 4 cm from the nipple.  Both of these masses resemble the area of fat necrosis previously biopsied at 2:00.  In addition, both masses have developing central benign or dystrophic appearing calcifications and are favored to be other areas of fat necrosis.  A 69-monthfollow-up bilateral mammogram and left breast ultrasound is recommended.   09/14/2017 Breast [place]  Breast [place] 09/14/17 at [facility]  IMPRESSION:  The 1 cm oval mass in the left breast at 9:30 posterior depth is highly suggestive of malignancy. An Ultrasound guided biopsy is recommended.  The 0.8 cm round mass in the left breast at 11-12 o'clock anterior depth is  suspicious of malignancy. An ultrasound guided biopsy is recommended.    09/22/2017 Pathology Results   Diagnosis 1. Breast, right, needle core biopsy - INVASIVE DUCTAL CARCINOMA, MSBR GRADE II/III. - DUCTAL CARCINOMA IN SITU WITH NECROSIS. 2. Breast, left, needle core biopsy - FAT NECROSIS. - NO EVIDENCE OF MALIGNANCY.   10/17/2017 Genetic  Testing   Negative genetic testing on the Multicancer panel.  The Multi-Gene Panel offered by [facility] includes sequencing and/or deletion duplication testing of the following 83 genes: ALK, APC, ATM, AXIN2,BAP1,  BARD1, BLM, BMPR1A, BRCA1, BRCA2, BRIP1, CASR, CDC73, CDH1, CDK4, CDKN1B, CDKN1C, CDKN2A (p14ARF), CDKN2A (p16INK4a), CEBPA, CHEK2, CTNNA1, DICER1, DIS3L2, EGFR (c.2369C>T, p.Thr790Met variant only), EPCAM (Deletion/duplication testing only), FH, FLCN, GATA2, GPC3, GREM1 (Promoter region deletion/duplication testing only), HOXB13 (c.251G>A, p.Gly84Glu), HRAS, KIT, MAX, MEN1, MET, MITF (c.952G>A, p.Glu318Lys variant only), MLH1, MSH2, MSH3, MSH6, MUTYH, NBN, NF1, NF2, NTHL1, PALB2, PDGFRA, PHOX2B, PMS2, POLD1, POLE, POT1, PRKAR1A, PTCH1, PTEN, RAD50, RAD51C, RAD51D, RB1, RECQL4, RET, RUNX1, SDHAF2, SDHA (sequence changes only), SDHB, SDHC, SDHD, SMAD4, SMARCA4, SMARCB1, SMARCE1, STK11, SUFU, TERT, TERT, TMEM127, TP53, TSC1, TSC2, VHL, WRN and WT1.  The report date is October 17, 2017.   Breast cancer of upper-outer quadrant of right female breast ([name])  09/14/2017 Mammogram   Diagnostic mammogram at [facility]  IMPRESSION:  The new 0.9 cm oval high density mass in the right breast posterior depth superior region seen on the mediolateral oblique view only is indeterminate. The 1.1 cm focal asymmetry in the left breast central to the nipple anterior depth is indeterminate.    09/14/2017 Imaging   [name] Bilateral at [facility] IMPRESSION: The 1 cm oval mass in the right breast at 9:30 posterior depth is highly suggestive of malignancy. The 0.8 cm round mass in the left breast at 11-12 o'clock anterior depth is suspicious of malignancy.    09/22/2017 Cancer Staging   Staging form: Breast, AJCC 8th Edition - Clinical stage from 09/22/2017: Stage IB (cT1b, cN0, cM0, G2, ER-, PR-, HER2-) - Signed by Truitt Merle, MD on 10/08/2017   09/22/2017 Pathology Results   Bilateral needle core biopsy 1. Breast, right, needle core  biopsy - INVASIVE DUCTAL CARCINOMA, MSBR GRADE II/III. - DUCTAL CARCINOMA IN SITU WITH NECROSIS. 2. Breast, left, needle core biopsy - FAT NECROSIS. - NO EVIDENCE OF MALIGNANCY.   09/25/2017 Receptors her2   Estrogen Receptor: 0 Progesterone 0 HER2: Negative. Ki-67: 80%    10/08/2017 Initial Diagnosis   Breast cancer of upper-outer quadrant of right female breast ([place])   10/26/2017 Surgery   BREAST LUMPECTOMY WITH RADIOACTIVE SEED AND SENTINEL LYMPH NODE BIOPSY by Dr. Excell Seltzer  10/26/17   10/26/2017 Pathology Results   Diagnosis 10/26/17 1. Breast, lumpectomy, Right - INVASIVE DUCTAL CARCINOMA, GRADE 2, SPANNING 1 CM. - HIGH GRADE DUCTAL CARCINOMA IN SITU WITH NECROSIS. - FINAL RESECTION MARGINS (PARTS #2, 3, 4) ARE NEGATIVE. - BIOPSY SITE. - SEE ONCOLOGY TABLE. 2. Breast, excision, additional anterior margin- 2 pieces right - BENIGN BREAST TISSUE. 3. Breast, excision, Right unoriented anterior margin - BENIGN BREAST TISSUE. 4. Breast, excision, Right deep margin - BENIGN BREAST TISSUE. 5. Lymph node, sentinel, biopsy, Right Axillary - ONE OF ONE LYMPH NODES NEGATIVE FOR CARCINOMA (0/1).   10/26/2017 Cancer Staging   Staging form: Breast, AJCC 8th Edition - Pathologic stage from 10/26/2017: Stage IB (pT1b, pN0, cM0, G2, ER-, PR-, HER2-) - Signed by Truitt Merle, MD on 11/12/2017   12/03/2017 - 02/25/2018 Chemotherapy   Weekly Abraxane for 12 weeks 12/03/17-02/25/18  03/2018 - 04/19/2018 Radiation Therapy   With Dr. Isidore Moos      CURRENT THERAPY:  Surveillance   INTERVAL HISTORY:  Adrienne [name] is here for a follow up right breast cancer. She was last seen by me over 1 year ago. In interim she was seen by NP Laice 6 months ago. She presents to the clinic alone. She notes she is doing well. She denies any pain and notes her appetite is adequate. She notes occasional diarrhea and no other stomach issues. She has been having sciatic nerve pain occasionally. She plans to go to rehab  for that and has been taking Gabapentin. She also notes arthritis in one of her fingers, but manageable. She still walks often and can walk up to 2.5 miles.   She plans to see her PCP in 07/2019. She notes she plans to get COVID vaccine 05/18/19. She lives in [place] in [place]. She notes she can still drive herself.     REVIEW OF SYSTEMS:   Constitutional: Denies fevers, chills or abnormal weight loss Eyes: Denies blurriness of vision Ears, nose, mouth, throat, and face: Denies mucositis or sore throat Respiratory: Denies cough, dyspnea or wheezes Cardiovascular: Denies palpitation, chest discomfort or lower extremity swelling Gastrointestinal:  Denies nausea, heartburn or change in bowel habits Skin: Denies abnormal skin rashes Lymphatics: Denies new lymphadenopathy or easy bruising Neurological:Denies numbness, tingling or new weaknesses Behavioral/Psych: Mood is stable, no new changes  All other systems were reviewed with the patient and are negative.  MEDICAL HISTORY:  Past Medical History:  Diagnosis Date  . Allergy   . Atrial fibrillation ([place])   . Breast cancer ([place]) 08/25/12   Invasive ductal ca,DCIS  . Diabetes mellitus    type II  . Family history of cancer   . Gout   . Hearing loss   . History of breast cancer   . History of frequent urinary tract infections   . History of radiation therapy 03/21/18- 04/19/18   Right Breast 2.67 Gy X 15 fractions, right breast boost 2 Gy X 5 fractions.   Marland Kitchen Hx of radiation therapy 11/22/12- 12/19/12   breast 4250 cGy 17 sessions, left breast boost 750 cGy 3 sessions  . Hyperlipidemia   . Hypertension   . Osteopenia     SURGICAL HISTORY: Past Surgical History:  Procedure Laterality Date  . ABDOMINAL HYSTERECTOMY Bilateral 1999   w/b/l salpingo-oopherectomy  . APPENDECTOMY    . BREAST LUMPECTOMY WITH NEEDLE LOCALIZATION AND AXILLARY SENTINEL LYMPH NODE BX Left 09/12/2012   Procedure: LEFT NEEDLE LOCALIZATION  BREAST LUMPECTOMY TION AND AXILLARY SENTINEL LYMPH NODE BX;  Surgeon: Edward Jolly, MD;  Location: [facility];  Service: General;  Laterality: Left;  . BREAST LUMPECTOMY WITH RADIOACTIVE SEED AND SENTINEL LYMPH NODE BIOPSY Right 10/26/2017   Procedure: BREAST LUMPECTOMY WITH RADIOACTIVE SEED AND SENTINEL LYMPH NODE BIOPSY;  Surgeon: Excell Seltzer, MD;  Location: [facility];  Service: General;  Laterality: Right;  . BREAST SURGERY  1990's   right- fibroid cyst  . CHOLECYSTECTOMY    . SEPTOPLASTY    . TONSILLECTOMY      I have reviewed the social history and family history with the patient and they are unchanged from previous note.  ALLERGIES:  is allergic to penicillins; other; prednisone; radiaplexrx [woun'dres hydrogel wound dress]; and sulfamethoxazole.  MEDICATIONS:  Current Outpatient Medications  Medication Sig Dispense Refill  . allopurinol (ZYLOPRIM) 300 MG tablet TAKE 1 TABLET BY MOUTH EVERY DAY  90 tablet 0  . apixaban (ELIQUIS) 2.5 MG TABS tablet Take 1 tablet (2.5 mg total) by mouth 2 (two) times daily. 180 tablet 1  . atenolol (TENORMIN) 25 MG tablet TAKE 1 TABLET BY MOUTH EVERY DAY 90 tablet 1  . Blood Glucose Monitoring Suppl (BLOOD GLUCOSE MONITOR SYSTEM) w/Device KIT Accu Check Aviva Meter 1 kit 0  . chlorpheniramine (CHLOR-TRIMETON) 4 MG tablet Take 4 mg 2 (two) times daily as needed by mouth for allergies.    Marland Kitchen diltiazem (CARDIZEM CD) 360 MG 24 hr capsule Take 1 capsule (360 mg total) by mouth daily. 90 capsule 1  . fish oil-omega-3 fatty acids 1000 MG capsule Take 1 g by mouth daily.    Marland Kitchen gabapentin (NEURONTIN) 100 MG capsule Take 1 capsule (100 mg total) by mouth at bedtime. 30 capsule 3  . glucose blood (ACCU-CHEK AVIVA PLUS) test strip USE TO TEST TWICE DAILY **ICD-10 CODE E13.9** 200 each 3  . lidocaine-prilocaine (EMLA) cream Apply 1 application topically as needed. 30 g 3  . loperamide (IMODIUM) 2 MG capsule Take 2 mg by mouth as needed for  diarrhea or loose stools.    . metFORMIN (GLUCOPHAGE) 500 MG tablet TAKE 1 TABLET BY MOUTH TWICE A DAY 180 tablet 0  . Multiple Vitamin (MULTIVITAMIN) tablet Take 1 tablet by mouth daily.    Marland Kitchen OVER THE COUNTER MEDICATION Take by mouth 2 (two) times daily. Presavision- for macular degeneration prevention    . TOBREX 0.3 % ophthalmic ointment APPLY A SMALL AMOUNT ON EYELID THREE TIMES A DAY AS NEEDED    . VITAMIN D, ERGOCALCIFEROL, PO Take by mouth.     No current facility-administered medications for this visit.    PHYSICAL EXAMINATION: ECOG PERFORMANCE STATUS: 0 - Asymptomatic  Vitals:   05/02/19 1041  BP: 134/77  Pulse: 76  Resp: 18  Temp: 98.3 F (36.8 C)  SpO2: 98%   Filed Weights   05/02/19 1041  Weight: 128 lb 3.2 oz (58.2 kg)    GENERAL:alert, no distress and comfortable SKIN: skin color, texture, turgor are normal, no rashes or significant lesions EYES: normal, Conjunctiva are pink and non-injected, sclera clear  NECK: supple, thyroid normal size, non-tender, without nodularity LYMPH:  no palpable lymphadenopathy in the cervical, axillary  LUNGS: clear to auscultation and percussion with normal breathing effort HEART: regular rate & rhythm and no murmurs and no lower extremity edema ABDOMEN:abdomen soft, non-tender and normal bowel sounds Musculoskeletal:no cyanosis of digits and no clubbing  NEURO: alert & oriented x 3 with fluent speech, no focal motor/sensory deficits BREAST: s/p b/l lumpectomy: Surgical incisions healed well (+) b/l scar tissue at incisions with multiple small firm nodules at lateral side of incision of right breast incision. (+) B/l mild arm lymphedema. No other palpable breast mass or adenopathy bilaterally  LABORATORY DATA:  I have reviewed the data as listed CBC Latest Ref Rng & Units 05/02/2019 10/26/2018 04/29/2018  WBC 4.0 - 10.5 K/uL 7.0 5.7 6.4  Hemoglobin 12.0 - 15.0 g/dL 14.0 14.0 14.4  Hematocrit 36.0 - 46.0 % 43.5 42.9 44.3    Platelets 150 - 400 K/uL 171 181 184.0     CMP Latest Ref Rng & Units 05/02/2019 10/26/2018 04/29/2018  Glucose 70 - 99 mg/dL 162(H) 231(H) 115(H)  BUN 8 - 23 mg/dL 30(H) 25(H) 22  Creatinine 0.44 - 1.00 mg/dL 1.24(H) 1.29(H) 1.15  Sodium 135 - 145 mmol/L 142 142 142  Potassium 3.5 - 5.1 mmol/L 4.2 4.0 3.6  Chloride 98 - 111 mmol/L 104 107 107  CO2 22 - 32 mmol/L _0 Calcium 8.9 - 10.3 mg/dL 9.2 8.9 9.5  Total Protein 6.5 - 8.1 g/dL 6.8 6.6 6.5  Total Bilirubin 0.3 - 1.2 mg/dL 0.5 0.3 0.4  Alkaline Phos 38 - 126 U/L 96 95 87  AST 15 - 41 U/L _1 ALT 0 - 44 U/L _2 RADIOGRAPHIC STUDIES: I have personally reviewed the radiological images as listed and agreed with the findings in the report. No results found.   ASSESSMENT & PLAN:  CASANDRA DALLAIRE is a 84 y.o. female with   1. Breast cancer of upper outer quadrant of right breast, invasive ductal carcinoma, stage IB, pT1bN0M0, grade 2, Triple Negative, Ki67: 80% -She was diagnosed in 09/2017. She is s/p right lumpectomy with SLNB, 12 weeks of adjuvant Abraxane and adjuvant radiation.  -Her genetic testing was negative -We previously discussed the breast cancer surveillance after her surgery. She will continue annual screening mammogram, self exam, and a routine office visit with lab and exam with [name]. -She is clinically doing well. Lab reviewed, her CBC and CMP are within normal limits except Cr 1.24. Her physical exam showed multiple small firm nodules along the incision line of right breast. I recommend right breast mammogram and [name] to further evaluate. She is agreeable. Her last mammogram in 10/2018 was unremarkable  -Continue surveillance. [facility]  -F/u in 6 months, or sooner if mammogram and [name] abnormal    2. pT1bN0, stage IA invasive ductal carcinoma of the left breast, grade I, ER 100%, PR 88%, Ki-67 16%, HER-2/neu negative. -She was diagnosed in 09/2012. She is s/p left  lumpectomy with SLNB on 09/12/2012, s/p radiation therapy from 11/09/2012-12/19/2012.  -She was on adjuvant Exemestane since 12/2012 which was switched to exemestane in 2019. She completed in 11/2017.  -09/16/17 US showed mass behind her left breast scar and a mass In her right breast, both highly suggestive of malignancy. Left breast found to be fat necrosis.   3. Osteopenia -03/2015 DEXA showed osteopenia, with 10 year probability of major ostial parotic fracture 25%, and hip fracture 15%.  -Given the high risk of fracture, she would benefit from bisphosphonate or Prolia injection. She has not done Prolia injection yet and she is hesitant to due to the concern of side effects. -She is on vitamin D supplement daily. No calcium given her hypercalcemia.  -Her 09/2017 DEXA from [facility] showed Osteopenia (-1.9 at right hip), very high risk of fracture, 29% for major osteoporotic fracture and 18% for hip fracture.  -next DEXA  In May 2021. If worse, would consider biphosphonate  4. Hypercalcemia  -She knows to drink adequately and avoid dehydration  -She was previously instructed to restart Vit D in 03/2017 as well as continue light weight bearing exercise.  -Ca normal today  5. Diabetes and hyperglycemia -She had elevated blood sugar when on steroids for hip pain, DC'd steroids  -She is currently on metformin.  -Strongly encouraged her to follow-up with PCP  6. Social Support -She lives at [facility] living and is very independent -Her children live close by her   7. Sciatic nerve pain  -She has been taking Gabapentin  -She plans to go to rehab for this soon. She will continue to f/u with her orthopedist.    Plan -diagnostic Mammogram and [name] of right breast at [place] in  a few weeks  -Lab and F/u in 6 months  -Routine b/l mammogram in 10/2019    No problem-specific Assessment & Plan notes found for this encounter.   Orders Placed This Encounter  Procedures  . US BREAST  LTD UNI RIGHT INC AXILLA    Standing Status:   Future    Standing Expiration Date:   07/01/2020    Scheduling Instructions:     [facility]    Order Specific Question:   Reason for Exam (SYMPTOM  OR DIAGNOSIS REQUIRED)    Answer:   right breast nodules at incision site of right breast, rule out local recurrence    Order Specific Question:   Preferred imaging location?    Answer:   External  . MM DIAG BREAST TOMO UNI RIGHT    Standing Status:   Future    Standing Expiration Date:   05/01/2020    Scheduling Instructions:     [facility]    Order Specific Question:   Reason for Exam (SYMPTOM  OR DIAGNOSIS REQUIRED)    Answer:   right breast nodules at incision site of right breast, rule out local recurrence    Order Specific Question:   Preferred imaging location?    Answer:   External   All questions were answered. The patient knows to call the clinic with any problems, questions or concerns. No barriers to learning was detected. I spent 20 minutes counseling the patient face to face. The total time spent in the appointment was 25 minutes and more than 50% was on counseling and review of test results     Truitt Merle, MD 05/02/2019   I, Joslyn Devon, am acting as scribe for Truitt Merle, MD.   I have reviewed the above documentation for accuracy and completeness, and I agree with the above.

## 2019-05-02 ENCOUNTER — Inpatient Hospital Stay: Payer: Medicare Other | Admitting: Hematology

## 2019-05-02 ENCOUNTER — Encounter: Payer: Self-pay | Admitting: Hematology

## 2019-05-02 ENCOUNTER — Inpatient Hospital Stay: Payer: Medicare Other | Attending: Hematology

## 2019-05-02 ENCOUNTER — Other Ambulatory Visit: Payer: Self-pay

## 2019-05-02 VITALS — BP 134/77 | HR 76 | Temp 98.3°F | Resp 18 | Ht 61.0 in | Wt 128.2 lb

## 2019-05-02 DIAGNOSIS — C50411 Malignant neoplasm of upper-outer quadrant of right female breast: Secondary | ICD-10-CM | POA: Diagnosis present

## 2019-05-02 DIAGNOSIS — M8580 Other specified disorders of bone density and structure, unspecified site: Secondary | ICD-10-CM | POA: Diagnosis not present

## 2019-05-02 DIAGNOSIS — Z171 Estrogen receptor negative status [ER-]: Secondary | ICD-10-CM | POA: Diagnosis not present

## 2019-05-02 DIAGNOSIS — Z853 Personal history of malignant neoplasm of breast: Secondary | ICD-10-CM | POA: Insufficient documentation

## 2019-05-02 DIAGNOSIS — Z7984 Long term (current) use of oral hypoglycemic drugs: Secondary | ICD-10-CM | POA: Insufficient documentation

## 2019-05-02 DIAGNOSIS — E1165 Type 2 diabetes mellitus with hyperglycemia: Secondary | ICD-10-CM | POA: Insufficient documentation

## 2019-05-02 LAB — CMP (CANCER CENTER ONLY)
ALT: 22 U/L (ref 0–44)
AST: 22 U/L (ref 15–41)
Albumin: 3.9 g/dL (ref 3.5–5.0)
Alkaline Phosphatase: 96 U/L (ref 38–126)
Anion gap: 11 (ref 5–15)
BUN: 30 mg/dL — ABNORMAL HIGH (ref 8–23)
CO2: 27 mmol/L (ref 22–32)
Calcium: 9.2 mg/dL (ref 8.9–10.3)
Chloride: 104 mmol/L (ref 98–111)
Creatinine: 1.24 mg/dL — ABNORMAL HIGH (ref 0.44–1.00)
GFR, Est AFR Am: 47 mL/min — ABNORMAL LOW (ref >60)
GFR, Estimated: 40 mL/min — ABNORMAL LOW (ref >60)
Glucose, Bld: 162 mg/dL — ABNORMAL HIGH (ref 70–99)
Potassium: 4.2 mmol/L (ref 3.5–5.1)
Sodium: 142 mmol/L (ref 135–145)
Total Bilirubin: 0.5 mg/dL (ref 0.3–1.2)
Total Protein: 6.8 g/dL (ref 6.5–8.1)

## 2019-05-02 LAB — CBC WITH DIFFERENTIAL (CANCER CENTER ONLY)
Abs Immature Granulocytes: 0.05 10*3/uL (ref 0.00–0.07)
Basophils Absolute: 0 10*3/uL (ref 0.0–0.1)
Basophils Relative: 1 %
Eosinophils Absolute: 0.1 10*3/uL (ref 0.0–0.5)
Eosinophils Relative: 1 %
HCT: 43.5 % (ref 36.0–46.0)
Hemoglobin: 14 g/dL (ref 12.0–15.0)
Immature Granulocytes: 1 %
Lymphocytes Relative: 17 %
Lymphs Abs: 1.2 10*3/uL (ref 0.7–4.0)
MCH: 31.8 pg (ref 26.0–34.0)
MCHC: 32.2 g/dL (ref 30.0–36.0)
MCV: 98.9 fL (ref 80.0–100.0)
Monocytes Absolute: 0.7 10*3/uL (ref 0.1–1.0)
Monocytes Relative: 10 %
Neutro Abs: 4.9 10*3/uL (ref 1.7–7.7)
Neutrophils Relative %: 70 %
Platelet Count: 171 10*3/uL (ref 150–400)
RBC: 4.4 MIL/uL (ref 3.87–5.11)
RDW: 14.1 % (ref 11.5–15.5)
WBC Count: 7 10*3/uL (ref 4.0–10.5)
nRBC: 0 % (ref 0.0–0.2)

## 2019-05-03 ENCOUNTER — Telehealth: Payer: Self-pay | Admitting: Hematology

## 2019-05-03 ENCOUNTER — Ambulatory Visit: Payer: Medicare Other

## 2019-05-03 DIAGNOSIS — R252 Cramp and spasm: Secondary | ICD-10-CM

## 2019-05-03 DIAGNOSIS — G8929 Other chronic pain: Secondary | ICD-10-CM

## 2019-05-03 DIAGNOSIS — M5441 Lumbago with sciatica, right side: Secondary | ICD-10-CM | POA: Diagnosis not present

## 2019-05-03 DIAGNOSIS — R293 Abnormal posture: Secondary | ICD-10-CM

## 2019-05-03 NOTE — Therapy (Signed)
[facility] [address] [place], [place], [address] Phone: [phone]   Fax:  [phone]  Physical Therapy Treatment  Patient Details  Name: Adrienne [name] MRN: [id number] Date of Birth: [birth year] Referring Provider (PT): Jean Rosenthal, MD   Encounter Date: 05/03/2019  PT End of Session - 05/03/19 1057    Visit Number  5    Date for PT Re-Evaluation  06/06/19    Authorization Type  Medicare    PT Start Time  1017    PT Stop Time  1059    PT Time Calculation (min)  42 min    Activity Tolerance  Patient tolerated treatment well    Behavior During Therapy  [facility] for tasks assessed/performed       Past Medical History:  Diagnosis Date  . Allergy   . Atrial fibrillation ([place])   . Breast cancer ([place]) 08/25/12   Invasive ductal ca,DCIS  . Diabetes mellitus    type II  . Family history of cancer   . Gout   . Hearing loss   . History of breast cancer   . History of frequent urinary tract infections   . History of radiation therapy 03/21/18- 04/19/18   Right Breast 2.67 Gy X 15 fractions, right breast boost 2 Gy X 5 fractions.   Marland Kitchen Hx of radiation therapy 11/22/12- 12/19/12   breast 4250 cGy 17 sessions, left breast boost 750 cGy 3 sessions  . Hyperlipidemia   . Hypertension   . Osteopenia     Past Surgical History:  Procedure Laterality Date  . ABDOMINAL HYSTERECTOMY Bilateral 1999   w/b/l salpingo-oopherectomy  . APPENDECTOMY    . BREAST LUMPECTOMY WITH NEEDLE LOCALIZATION AND AXILLARY SENTINEL LYMPH NODE BX Left 09/12/2012   Procedure: LEFT NEEDLE LOCALIZATION BREAST LUMPECTOMY TION AND AXILLARY SENTINEL LYMPH NODE BX;  Surgeon: Edward Jolly, MD;  Location: [place];  Service: General;  Laterality: Left;  . BREAST LUMPECTOMY WITH RADIOACTIVE SEED AND SENTINEL LYMPH NODE BIOPSY Right 10/26/2017   Procedure: BREAST LUMPECTOMY WITH RADIOACTIVE SEED AND SENTINEL LYMPH NODE BIOPSY;  Surgeon: Excell Seltzer, MD;  Location: [place];  Service: General;  Laterality: Right;  . BREAST SURGERY  1990's   right- fibroid cyst  . CHOLECYSTECTOMY    . SEPTOPLASTY    . TONSILLECTOMY      There were no vitals filed for this visit.  Subjective Assessment - 05/03/19 1020    Subjective  No pain today.  I am feeling better overall.    Patient Stated Goals  sleep better, not wake with pain, sit for reading without limitation    Currently in Pain?  No/denies                       [facility] Adult PT Treatment/Exercise - 05/03/19 0001      Lumbar Exercises: Stretches   Active Hamstring Stretch  Right;Left;3 reps;30 seconds    Active Hamstring Stretch Limitations  with stap with bias     Single Knee to Chest Stretch  3 reps;20 seconds    Single Knee to Chest Stretch Limitations  slight adduction bias       Lumbar Exercises: Supine   Clam  20 reps    Clam Limitations  red band with abdominal bracing    Other Supine Lumbar Exercises  ball squeezes 5 sec hold 2x10      Knee/Hip Exercises: Aerobic   Nustep  Level 2x  10 minutes      Knee/Hip Exercises: Standing   Hip Abduction  Stengthening;Both;10 reps;1 set    Hip Extension  Stengthening;Both;10 reps;1 set    Rocker Board  --    Rebounder  3 ways x 1 minute each      Knee/Hip Exercises: Seated   Long Arc Quad  Strengthening;Both;10 reps;1 set      Manual Therapy   Soft tissue mobilization  --               PT Short Term Goals - 05/03/19 1023      PT SHORT TERM GOAL #1   Title  be independent in initial HEP    Status  Achieved      PT SHORT TERM GOAL #2   Title  sleep with 25% fewer sleep interruptions due to Rt gluteal without the use of pain medication    Time  4    Period  Weeks    Status  On-going        PT Long Term Goals - 04/25/19 0857      PT LONG TERM GOAL #4   Title  sit to read without limitation due to pain    Baseline  when I sit up straight, I have less pain    Time  8     Period  Weeks    Status  On-going            Plan - 05/03/19 1032    Clinical Impression Statement  Remains active with walking daily.  The gym where she lives is closed so she is not able to exercise as regularly as she used to.  Pt reports a 50% reduction in Rt gluteal pain with sleep at night and reports improved sleep overall due to this.  Pt with gait abnormality due to reduced time spent in stance on the Rt LE and knee flexion in stance bilaterally.  Pt worked on weight shifting and level pelvis with single limb exercise today to improve Rt glute med activation.  Pt required tactile cues for improved hamstring stretch to address chronic hamstring flexibility deficits.  Pt will continue to benefit from skilled PT to address hip flexibility and strength, gait and balance exercises.    PT Frequency  2x / week    PT Duration  8 weeks    PT Treatment/Interventions  ADLs/Self Care Home Management;Cryotherapy;Electrical Stimulation;Moist Heat;Functional mobility training;Therapeutic activities;Therapeutic exercise;Patient/family education;Neuromuscular re-education;Manual techniques;Passive range of motion;Taping;Dry needling    PT Next Visit Plan  gluteal strength, flexbility, manual    PT Home Exercise Plan  Access Code: [email or web address]    Consulted and Agree with Plan of Care  Patient       Patient will benefit from skilled therapeutic intervention in order to improve the following deficits and impairments:  Abnormal gait, Postural dysfunction, Pain, Decreased activity tolerance, Increased muscle spasms, Decreased endurance  Visit Diagnosis: Chronic right-sided low back pain with right-sided sciatica  Cramp and spasm  Abnormal posture     Problem List Patient Active Problem List   Diagnosis Date Noted  . Port-A-Cath in place 01/28/2018  . Genetic testing 10/18/2017  . Family history of cancer   . History of breast cancer   . Breast cancer of upper-outer quadrant of right female  breast ([place]) 10/08/2017  . Osteopenia 09/21/2015  . Rapid heart rate 04/01/2015  . Atrial fibrillation ([place]) 04/01/2015  . Diabetes 1.5, managed as type 2 ([place]) 09/04/2014  . Breast cancer, left breast (  [place]) 08/30/2012  . Influenza 08/04/2011  . Gout 06/01/2011  . Vaginitis, atrophic 06/01/2011  . RENAL INSUFFICIENCY, ACUTE 05/28/2010  . HEMATURIA, HX OF 05/28/2010  . Disorder of bone and cartilage 12/08/2007  . Hyperlipidemia 08/10/2007  . Essential hypertension 08/10/2007     Sigurd Sos, PT 05/03/19 10:59 AM  [facility] [address], [place], [place], [address] Phone: [phone]   Fax:  [phone]  Name: FARRELL STORMENT MRN: [id number] Date of Birth: 1934-11-05

## 2019-05-03 NOTE — Telephone Encounter (Signed)
Scheduled appt per 12/29 los.  Sent a message to HIM pool to get a calendar mailed out. 

## 2019-05-10 ENCOUNTER — Other Ambulatory Visit: Payer: Self-pay

## 2019-05-10 ENCOUNTER — Ambulatory Visit: Payer: Medicare PPO | Attending: Orthopaedic Surgery

## 2019-05-10 DIAGNOSIS — G8929 Other chronic pain: Secondary | ICD-10-CM

## 2019-05-10 DIAGNOSIS — M5441 Lumbago with sciatica, right side: Secondary | ICD-10-CM | POA: Insufficient documentation

## 2019-05-10 DIAGNOSIS — R252 Cramp and spasm: Secondary | ICD-10-CM | POA: Insufficient documentation

## 2019-05-10 DIAGNOSIS — R293 Abnormal posture: Secondary | ICD-10-CM | POA: Insufficient documentation

## 2019-05-10 NOTE — Therapy (Signed)
[facility] [address], [place], [place], [address] Phone: [phone]   Fax:  [phone]  Physical Therapy Treatment  Patient Details  Name: Adrienne [name] MRN: [id number] Date of Birth: 1935-04-01 Referring Provider (PT): Jean Rosenthal, MD   Encounter Date: 05/10/2019  PT End of Session - 05/10/19 1056    Visit Number  6    Date for PT Re-Evaluation  06/06/19    Authorization Type  Medicare    PT Start Time  1016    PT Stop Time  1057    PT Time Calculation (min)  41 min    Activity Tolerance  Patient tolerated treatment well    Behavior During Therapy  [facility] for tasks assessed/performed       Past Medical History:  Diagnosis Date  . Allergy   . Atrial fibrillation ([place])   . Breast cancer ([place]) 08/25/12   Invasive ductal ca,DCIS  . Diabetes mellitus    type II  . Family history of cancer   . Gout   . Hearing loss   . History of breast cancer   . History of frequent urinary tract infections   . History of radiation therapy 03/21/18- 04/19/18   Right Breast 2.67 Gy X 15 fractions, right breast boost 2 Gy X 5 fractions.   Marland Kitchen Hx of radiation therapy 11/22/12- 12/19/12   breast 4250 cGy 17 sessions, left breast boost 750 cGy 3 sessions  . Hyperlipidemia   . Hypertension   . Osteopenia     Past Surgical History:  Procedure Laterality Date  . ABDOMINAL HYSTERECTOMY Bilateral 1999   w/b/l salpingo-oopherectomy  . APPENDECTOMY    . BREAST LUMPECTOMY WITH NEEDLE LOCALIZATION AND AXILLARY SENTINEL LYMPH NODE BX Left 09/12/2012   Procedure: LEFT NEEDLE LOCALIZATION BREAST LUMPECTOMY TION AND AXILLARY SENTINEL LYMPH NODE BX;  Surgeon: Edward Jolly, MD;  Location: [place];  Service: General;  Laterality: Left;  . BREAST LUMPECTOMY WITH RADIOACTIVE SEED AND SENTINEL LYMPH NODE BIOPSY Right 10/26/2017   Procedure: BREAST LUMPECTOMY WITH RADIOACTIVE SEED AND SENTINEL LYMPH NODE BIOPSY;  Surgeon: Excell Seltzer, MD;  Location: [place];  Service: General;  Laterality: Right;  . BREAST SURGERY  1990's   right- fibroid cyst  . CHOLECYSTECTOMY    . SEPTOPLASTY    . TONSILLECTOMY      There were no vitals filed for this visit.  Subjective Assessment - 05/10/19 1023    Subjective  I get pain if I sit too long.  I am able to sleep on my Rt side again.  My son had a heart attack last week so I have been under stress.    Currently in Pain?  No/denies                       [facility] Adult PT Treatment/Exercise - 05/10/19 0001      Lumbar Exercises: Stretches   Active Hamstring Stretch  Right;Left;3 reps;30 seconds    Active Hamstring Stretch Limitations  seated       Knee/Hip Exercises: Aerobic   Nustep  Level 2x 10 minutes      Knee/Hip Exercises: Standing   Heel Raises  Both;2 sets;10 reps    Hip Abduction  Stengthening;Both;10 reps;1 set    Abduction Limitations  on foam pad    Hip Extension  Stengthening;Both;10 reps;1 set    Extension Limitations  on foam pad    Other Standing Knee Exercises  on black pad: tandem stance bil 2x30 seconds and feet together 2x30 seconds      Knee/Hip Exercises: Seated   Long Arc Quad  Strengthening;Both;10 reps;1 set               PT Short Term Goals - 05/10/19 1026      PT SHORT TERM GOAL #2   Title  sleep with 25% fewer sleep interruptions due to Rt gluteal without the use of pain medication    Status  Achieved        PT Long Term Goals - 05/10/19 1026      PT LONG TERM GOAL #1   Title  be independent in advanced HEP    Time  8    Period  Weeks    Status  On-going      PT LONG TERM GOAL #3   Title  report < or = to 2/10 Rt LE pain at night to allow for sleep without limitation    Baseline  up to 5/10 max- not waking up    Time  8    Period  Weeks    Status  On-going      PT LONG TERM GOAL #4   Title  sit to read without limitation due to pain    Time  8    Period  Weeks    Status   On-going            Plan - 05/10/19 1041    Clinical Impression Statement  Pt reports 50% overall reduction in Rt LE pain with sitting to read and with sleep at night.  Pt is not waking as frequently with Rt LE pain.  Pt continues to walk 2-3 miles each day and performs HEP regularly.  Pt required close supervision for exercise and CGA with balance exercises.  Pt demonstrates poor foot clearance with gait upon standing.  Pt requires verbal cues for standing posture and foot clearance.  Pt will continue to benefit from skilled PT for LE strength, balance, endurance, and to address Rt hip pain as needed.    PT Frequency  2x / week    PT Duration  8 weeks    PT Treatment/Interventions  ADLs/Self Care Home Management;Cryotherapy;Electrical Stimulation;Moist Heat;Functional mobility training;Therapeutic activities;Therapeutic exercise;Patient/family education;Neuromuscular re-education;Manual techniques;Passive range of motion;Taping;Dry needling    PT Next Visit Plan  gluteal strength, flexbility, manual, balance/gait exercises    PT Home Exercise Plan  Access Code: [email or web address]    Consulted and Agree with Plan of Care  Patient       Patient will benefit from skilled therapeutic intervention in order to improve the following deficits and impairments:  Abnormal gait, Postural dysfunction, Pain, Decreased activity tolerance, Increased muscle spasms, Decreased endurance  Visit Diagnosis: Cramp and spasm  Chronic right-sided low back pain with right-sided sciatica  Abnormal posture     Problem List Patient Active Problem List   Diagnosis Date Noted  . Port-A-Cath in place 01/28/2018  . Genetic testing 10/18/2017  . Family history of cancer   . History of breast cancer   . Breast cancer of upper-outer quadrant of right female breast ([place]) 10/08/2017  . Osteopenia 09/21/2015  . Rapid heart rate 04/01/2015  . Atrial fibrillation ([place]) 04/01/2015  . Diabetes 1.5, managed as type 2 ([place])  09/04/2014  . Breast cancer, left breast ([place]) 08/30/2012  . Influenza 08/04/2011  . Gout 06/01/2011  . Vaginitis, atrophic 06/01/2011  . RENAL INSUFFICIENCY, ACUTE 05/28/2010  . HEMATURIA,  HX OF 05/28/2010  . Disorder of bone and cartilage 12/08/2007  . Hyperlipidemia 08/10/2007  . Essential hypertension 08/10/2007    Sigurd Sos, PT 05/10/19 10:59 AM  [facility] [address] [place], [place], [address] Phone: [phone]   Fax:  [phone]  Name: Adrienne [name] MRN: [id number] Date of Birth: Sep 02, 1934

## 2019-05-11 ENCOUNTER — Ambulatory Visit: Payer: Medicare PPO

## 2019-05-16 ENCOUNTER — Ambulatory Visit: Payer: Medicare PPO | Admitting: Physical Therapy

## 2019-05-17 ENCOUNTER — Ambulatory Visit: Payer: Medicare PPO

## 2019-05-17 ENCOUNTER — Other Ambulatory Visit: Payer: Self-pay

## 2019-05-17 ENCOUNTER — Telehealth: Payer: Self-pay

## 2019-05-17 DIAGNOSIS — R252 Cramp and spasm: Secondary | ICD-10-CM | POA: Diagnosis not present

## 2019-05-17 DIAGNOSIS — M5441 Lumbago with sciatica, right side: Secondary | ICD-10-CM

## 2019-05-17 DIAGNOSIS — R293 Abnormal posture: Secondary | ICD-10-CM

## 2019-05-17 DIAGNOSIS — G8929 Other chronic pain: Secondary | ICD-10-CM

## 2019-05-17 NOTE — Telephone Encounter (Signed)
Adrienne [name] phoned stating she tried to schedule her mammogram and u/s at [facility] they did not have the orders.  Orders and referral fax to [place] at [phone].  I spoke to Adrienne [name] regarding the above.  She verbalized understanding.

## 2019-05-17 NOTE — Therapy (Signed)
[facility] [address] [place], [place], [address] Phone: [phone]   Fax:  [phone]  Physical Therapy Treatment  Patient Details  Name: Adrienne [name] MRN: [id number] Date of Birth: 09/01/1934 Referring Provider (PT): Jean Rosenthal, MD   Encounter Date: 05/17/2019  PT End of Session - 05/17/19 1308    Visit Number  7    Date for PT Re-Evaluation  06/06/19    Authorization Type  Medicare    PT Start Time  1228    PT Stop Time  1308    PT Time Calculation (min)  40 min    Activity Tolerance  Patient tolerated treatment well    Behavior During Therapy  [facility] for tasks assessed/performed       Past Medical History:  Diagnosis Date  . Allergy   . Atrial fibrillation ([place])   . Breast cancer ([place]) 08/25/12   Invasive ductal ca,DCIS  . Diabetes mellitus    type II  . Family history of cancer   . Gout   . Hearing loss   . History of breast cancer   . History of frequent urinary tract infections   . History of radiation therapy 03/21/18- 04/19/18   Right Breast 2.67 Gy X 15 fractions, right breast boost 2 Gy X 5 fractions.   Marland Kitchen Hx of radiation therapy 11/22/12- 12/19/12   breast 4250 cGy 17 sessions, left breast boost 750 cGy 3 sessions  . Hyperlipidemia   . Hypertension   . Osteopenia     Past Surgical History:  Procedure Laterality Date  . ABDOMINAL HYSTERECTOMY Bilateral 1999   w/b/l salpingo-oopherectomy  . APPENDECTOMY    . BREAST LUMPECTOMY WITH NEEDLE LOCALIZATION AND AXILLARY SENTINEL LYMPH NODE BX Left 09/12/2012   Procedure: LEFT NEEDLE LOCALIZATION BREAST LUMPECTOMY TION AND AXILLARY SENTINEL LYMPH NODE BX;  Surgeon: Edward Jolly, MD;  Location: [place];  Service: General;  Laterality: Left;  . BREAST LUMPECTOMY WITH RADIOACTIVE SEED AND SENTINEL LYMPH NODE BIOPSY Right 10/26/2017   Procedure: BREAST LUMPECTOMY WITH RADIOACTIVE SEED AND SENTINEL LYMPH NODE BIOPSY;  Surgeon: Excell Seltzer, MD;  Location: [place];  Service: General;  Laterality: Right;  . BREAST SURGERY  1990's   right- fibroid cyst  . CHOLECYSTECTOMY    . SEPTOPLASTY    . TONSILLECTOMY      There were no vitals filed for this visit.  Subjective Assessment - 05/17/19 1232    Subjective  I have been walking and doing my exercises.    Currently in Pain?  No/denies                       [facility] Adult PT Treatment/Exercise - 05/17/19 0001      Lumbar Exercises: Stretches   Active Hamstring Stretch  Right;Left;3 reps;30 seconds    Active Hamstring Stretch Limitations  seated       Knee/Hip Exercises: Aerobic   Nustep  Level 2x 10 minutes      Knee/Hip Exercises: Standing   Heel Raises  Both;2 sets;10 reps    Hip Abduction  Stengthening;Both;10 reps;1 set    Abduction Limitations  on foam pad    Hip Extension  Stengthening;Both;10 reps;1 set    Extension Limitations  on foam pad    Rocker Board  3 minutes    Rebounder  weight shifting 3 ways x 1 minute each    Other Standing Knee Exercises  alternating step taps 2x10  bil with min UE support    Other Standing Knee Exercises  on black pad: tandem stance bil 2x30 seconds and feet together 2x30 seconds               PT Short Term Goals - 05/10/19 1026      PT SHORT TERM GOAL #2   Title  sleep with 25% fewer sleep interruptions due to Rt gluteal without the use of pain medication    Status  Achieved        PT Long Term Goals - 05/10/19 1026      PT LONG TERM GOAL #1   Title  be independent in advanced HEP    Time  8    Period  Weeks    Status  On-going      PT LONG TERM GOAL #3   Title  report < or = to 2/10 Rt LE pain at night to allow for sleep without limitation    Baseline  up to 5/10 max- not waking up    Time  8    Period  Weeks    Status  On-going      PT LONG TERM GOAL #4   Title  sit to read without limitation due to pain    Time  8    Period  Weeks    Status  On-going             Plan - 05/17/19 1238    Clinical Impression Statement  Pt reports 60% overall reduction in Rt LE pain with sitting to read and with sleep at night.  Pt is not waking as frequently with Rt LE pain.  Pt continues to walk 2-3 miles each day and performs HEP regularly.  Pt required close supervision for exercise and CGA with balance exercises.  Pt demonstrates poor foot clearance with gait upon standing and this improves with verbal cues. Pt with shortened hamstring length which contributes to gait pattern.   Pt requires verbal cues for standing posture and foot clearance.  Pt will continue to benefit from skilled PT for LE strength, balance, endurance, and to address Rt hip pain as needed.    PT Frequency  2x / week    PT Duration  8 weeks    PT Treatment/Interventions  ADLs/Self Care Home Management;Cryotherapy;Electrical Stimulation;Moist Heat;Functional mobility training;Therapeutic activities;Therapeutic exercise;Patient/family education;Neuromuscular re-education;Manual techniques;Passive range of motion;Taping;Dry needling    PT Next Visit Plan  gluteal strength, flexbility, manual, balance/gait exercises    PT Home Exercise Plan  Access Code: [email or web address]    Consulted and Agree with Plan of Care  Patient       Patient will benefit from skilled therapeutic intervention in order to improve the following deficits and impairments:  Abnormal gait, Postural dysfunction, Pain, Decreased activity tolerance, Increased muscle spasms, Decreased endurance  Visit Diagnosis: Cramp and spasm  Chronic right-sided low back pain with right-sided sciatica  Abnormal posture     Problem List Patient Active Problem List   Diagnosis Date Noted  . Port-A-Cath in place 01/28/2018  . Genetic testing 10/18/2017  . Family history of cancer   . History of breast cancer   . Breast cancer of upper-outer quadrant of right female breast ([place]) 10/08/2017  . Osteopenia 09/21/2015  . Rapid heart rate  04/01/2015  . Atrial fibrillation ([place]) 04/01/2015  . Diabetes 1.5, managed as type 2 ([place]) 09/04/2014  . Breast cancer, left breast ([place]) 08/30/2012  . Influenza 08/04/2011  . Gout 06/01/2011  .  Vaginitis, atrophic 06/01/2011  . RENAL INSUFFICIENCY, ACUTE 05/28/2010  . HEMATURIA, HX OF 05/28/2010  . Disorder of bone and cartilage 12/08/2007  . Hyperlipidemia 08/10/2007  . Essential hypertension 08/10/2007     Sigurd Sos, PT 05/17/19 1:10 PM  [facility] [address], [place], [place], [address] Phone: [phone]   Fax:  [phone]  Name: Adrienne [name] MRN: [id number] Date of Birth: 03/27/1935

## 2019-05-22 ENCOUNTER — Other Ambulatory Visit: Payer: Self-pay

## 2019-05-22 ENCOUNTER — Ambulatory Visit: Payer: Medicare PPO

## 2019-05-22 DIAGNOSIS — R293 Abnormal posture: Secondary | ICD-10-CM

## 2019-05-22 DIAGNOSIS — G8929 Other chronic pain: Secondary | ICD-10-CM

## 2019-05-22 DIAGNOSIS — R252 Cramp and spasm: Secondary | ICD-10-CM | POA: Diagnosis not present

## 2019-05-22 NOTE — Therapy (Signed)
[facility] [address] [place], [place], [address] Phone: [phone]   Fax:  [phone]  Physical Therapy Treatment  Patient Details  Name: Adrienne [name] MRN: [id number] Date of Birth: 01/22/1935 Referring Provider (PT): Jean Rosenthal, MD   Encounter Date: 05/22/2019  PT End of Session - 05/22/19 1053    Visit Number  8    Date for PT Re-Evaluation  06/06/19    Authorization Type  Medicare    PT Start Time  1014    PT Stop Time  1055    PT Time Calculation (min)  41 min    Activity Tolerance  Patient tolerated treatment well    Behavior During Therapy  [facility] for tasks assessed/performed       Past Medical History:  Diagnosis Date  . Allergy   . Atrial fibrillation ([place])   . Breast cancer ([place]) 08/25/12   Invasive ductal ca,DCIS  . Diabetes mellitus    type II  . Family history of cancer   . Gout   . Hearing loss   . History of breast cancer   . History of frequent urinary tract infections   . History of radiation therapy 03/21/18- 04/19/18   Right Breast 2.67 Gy X 15 fractions, right breast boost 2 Gy X 5 fractions.   Marland Kitchen Hx of radiation therapy 11/22/12- 12/19/12   breast 4250 cGy 17 sessions, left breast boost 750 cGy 3 sessions  . Hyperlipidemia   . Hypertension   . Osteopenia     Past Surgical History:  Procedure Laterality Date  . ABDOMINAL HYSTERECTOMY Bilateral 1999   w/b/l salpingo-oopherectomy  . APPENDECTOMY    . BREAST LUMPECTOMY WITH NEEDLE LOCALIZATION AND AXILLARY SENTINEL LYMPH NODE BX Left 09/12/2012   Procedure: LEFT NEEDLE LOCALIZATION BREAST LUMPECTOMY TION AND AXILLARY SENTINEL LYMPH NODE BX;  Surgeon: Edward Jolly, MD;  Location: [place];  Service: General;  Laterality: Left;  . BREAST LUMPECTOMY WITH RADIOACTIVE SEED AND SENTINEL LYMPH NODE BIOPSY Right 10/26/2017   Procedure: BREAST LUMPECTOMY WITH RADIOACTIVE SEED AND SENTINEL LYMPH NODE BIOPSY;  Surgeon: Excell Seltzer, MD;  Location: [place];  Service: General;  Laterality: Right;  . BREAST SURGERY  1990's   right- fibroid cyst  . CHOLECYSTECTOMY    . SEPTOPLASTY    . TONSILLECTOMY      There were no vitals filed for this visit.  Subjective Assessment - 05/22/19 1020    Subjective  The gym opens at [facility] today so I will do the rowing machine    Patient Stated Goals  sleep better, not wake with pain, sit for reading without limitation    Currently in Pain?  No/denies                       [facility] Adult PT Treatment/Exercise - 05/22/19 0001      Lumbar Exercises: Stretches   Active Hamstring Stretch  Right;Left;3 reps;30 seconds    Active Hamstring Stretch Limitations  seated       Knee/Hip Exercises: Aerobic   Nustep  Level 2x 10 minutes   PT present to discuss progress     Knee/Hip Exercises: Standing   Heel Raises  Both;2 sets;10 reps    Hip Abduction  Stengthening;Both;10 reps;2 sets    Abduction Limitations  on foam pad 2# added    Hip Extension  Stengthening;Both;10 reps;2 sets    Extension Limitations  on foam pad, 2#  added    Rocker Board  3 minutes    Other Standing Knee Exercises  alternating step taps 2x10 bil with min UE support    Other Standing Knee Exercises  on black pad: tandem stance bil 2x30 seconds and feet together 2x30 seconds               PT Short Term Goals - 05/10/19 1026      PT SHORT TERM GOAL #2   Title  sleep with 25% fewer sleep interruptions due to Rt gluteal without the use of pain medication    Status  Achieved        PT Long Term Goals - 05/22/19 1028      PT LONG TERM GOAL #3   Title  report < or = to 2/10 Rt LE pain at night to allow for sleep without limitation    Baseline  4/10    Time  8    Period  Weeks    Status  On-going      PT LONG TERM GOAL #4   Title  sit to read without limitation due to pain    Status  Achieved            Plan - 05/22/19 1030    Clinical  Impression Statement  Pt will be able to return to the gym at [facility] today and will be able to exercise 3x/wk now.  Pt denies any Rt LE pain at this time.  Pt does report 75% reduction in the intensity of her pain and she is no longer waking with pain. Pt is able to sit and read without increased pain.  Pt tolerated the addition of 2# ankle weights today with exercise.  Pt requires stand by assistance for safety with standing strength and balance exercise. Pt will continue to benefit from skilled PT to address strength, balance and endurance and address pain as needed.    PT Frequency  2x / week    PT Duration  8 weeks    PT Treatment/Interventions  ADLs/Self Care Home Management;Cryotherapy;Electrical Stimulation;Moist Heat;Functional mobility training;Therapeutic activities;Therapeutic exercise;Patient/family education;Neuromuscular re-education;Manual techniques;Passive range of motion;Taping;Dry needling    PT Next Visit Plan  gluteal strength, flexbility, manual, balance/gait exercises    PT Home Exercise Plan  Access Code: [email or web address]    Consulted and Agree with Plan of Care  Patient       Patient will benefit from skilled therapeutic intervention in order to improve the following deficits and impairments:  Abnormal gait, Postural dysfunction, Pain, Decreased activity tolerance, Increased muscle spasms, Decreased endurance  Visit Diagnosis: Cramp and spasm  Chronic right-sided low back pain with right-sided sciatica  Abnormal posture     Problem List Patient Active Problem List   Diagnosis Date Noted  . Port-A-Cath in place 01/28/2018  . Genetic testing 10/18/2017  . Family history of cancer   . History of breast cancer   . Breast cancer of upper-outer quadrant of right female breast ([place]) 10/08/2017  . Osteopenia 09/21/2015  . Rapid heart rate 04/01/2015  . Atrial fibrillation ([place]) 04/01/2015  . Diabetes 1.5, managed as type 2 ([place]) 09/04/2014  . Breast cancer, left  breast ([place]) 08/30/2012  . Influenza 08/04/2011  . Gout 06/01/2011  . Vaginitis, atrophic 06/01/2011  . RENAL INSUFFICIENCY, ACUTE 05/28/2010  . HEMATURIA, HX OF 05/28/2010  . Disorder of bone and cartilage 12/08/2007  . Hyperlipidemia 08/10/2007  . Essential hypertension 08/10/2007     Sigurd Sos, PT 05/22/19 10:54  AM  [facility] [address] [place], [place], [address] Phone: [phone]   Fax:  [phone]  Name: Adrienne [name] MRN: [id number] Date of Birth: June 04, 1934

## 2019-05-24 ENCOUNTER — Ambulatory Visit: Payer: Medicare PPO

## 2019-05-24 ENCOUNTER — Other Ambulatory Visit: Payer: Self-pay

## 2019-05-24 DIAGNOSIS — R252 Cramp and spasm: Secondary | ICD-10-CM

## 2019-05-24 DIAGNOSIS — G8929 Other chronic pain: Secondary | ICD-10-CM

## 2019-05-24 DIAGNOSIS — R293 Abnormal posture: Secondary | ICD-10-CM

## 2019-05-24 NOTE — Therapy (Signed)
[facility] [address] [place], [place], [address] Phone: [phone]   Fax:  [phone]  Physical Therapy Treatment  Patient Details  Name: Adrienne [name] MRN: [id number] Date of Birth: [birth year] Referring Provider (PT): Jean Rosenthal, MD   Encounter Date: 05/24/2019  PT End of Session - 05/24/19 1053    Visit Number  9    Date for PT Re-Evaluation  06/06/19    Authorization Type  Medicare    PT Start Time  1016    PT Stop Time  1055    PT Time Calculation (min)  39 min    Activity Tolerance  Patient tolerated treatment well    Behavior During Therapy  [facility] for tasks assessed/performed       Past Medical History:  Diagnosis Date  . Allergy   . Atrial fibrillation ([place])   . Breast cancer ([place]) 08/25/12   Invasive ductal ca,DCIS  . Diabetes mellitus    type II  . Family history of cancer   . Gout   . Hearing loss   . History of breast cancer   . History of frequent urinary tract infections   . History of radiation therapy 03/21/18- 04/19/18   Right Breast 2.67 Gy X 15 fractions, right breast boost 2 Gy X 5 fractions.   Marland Kitchen Hx of radiation therapy 11/22/12- 12/19/12   breast 4250 cGy 17 sessions, left breast boost 750 cGy 3 sessions  . Hyperlipidemia   . Hypertension   . Osteopenia     Past Surgical History:  Procedure Laterality Date  . ABDOMINAL HYSTERECTOMY Bilateral 1999   w/b/l salpingo-oopherectomy  . APPENDECTOMY    . BREAST LUMPECTOMY WITH NEEDLE LOCALIZATION AND AXILLARY SENTINEL LYMPH NODE BX Left 09/12/2012   Procedure: LEFT NEEDLE LOCALIZATION BREAST LUMPECTOMY TION AND AXILLARY SENTINEL LYMPH NODE BX;  Surgeon: Edward Jolly, MD;  Location: [place];  Service: General;  Laterality: Left;  . BREAST LUMPECTOMY WITH RADIOACTIVE SEED AND SENTINEL LYMPH NODE BIOPSY Right 10/26/2017   Procedure: BREAST LUMPECTOMY WITH RADIOACTIVE SEED AND SENTINEL LYMPH NODE BIOPSY;  Surgeon: Excell Seltzer, MD;  Location: [place];  Service: General;  Laterality: Right;  . BREAST SURGERY  1990's   right- fibroid cyst  . CHOLECYSTECTOMY    . SEPTOPLASTY    . TONSILLECTOMY      There were no vitals filed for this visit.  Subjective Assessment - 05/24/19 1026    Subjective  I might have overdone it at the gym on Monday but it felt good to be back.    Currently in Pain?  No/denies                       [facility] Adult PT Treatment/Exercise - 05/24/19 0001      Ambulation/Gait   Gait Comments  weaving in/out of dark tiles on floor 4x25 feet with gait belt      Lumbar Exercises: Stretches   Active Hamstring Stretch  Right;Left;3 reps;30 seconds    Active Hamstring Stretch Limitations  seated       Knee/Hip Exercises: Aerobic   Nustep  Level 2x 10 minutes   PT present to discuss progress     Knee/Hip Exercises: Standing   Hip Abduction  Stengthening;Both;10 reps;2 sets    Abduction Limitations  on foam pad 2# added    Hip Extension  Stengthening;Both;10 reps;2 sets    Extension Limitations  on foam pad, 2# added  Rocker Board  3 minutes    Other Standing Knee Exercises  alternating step taps 2x10 bil with min UE support    Other Standing Knee Exercises  on black pad: tandem stance bil 2x30 seconds and feet together 2x30 seconds               PT Short Term Goals - 05/10/19 1026      PT SHORT TERM GOAL #2   Title  sleep with 25% fewer sleep interruptions due to Rt gluteal without the use of pain medication    Status  Achieved        PT Long Term Goals - 05/22/19 1028      PT LONG TERM GOAL #3   Title  report < or = to 2/10 Rt LE pain at night to allow for sleep without limitation    Baseline  4/10    Time  8    Period  Weeks    Status  On-going      PT LONG TERM GOAL #4   Title  sit to read without limitation due to pain    Status  Achieved            Plan - 05/24/19 1041    Clinical Impression Statement  Pt  will be able to return to the gym at [facility] this week  and will be able to exercise 3x/wk now.  Pt denies any Rt LE pain at this time.  Pt does report 75% reduction in the intensity of her pain and she is no longer waking with pain. Session focused on strength, balance and proprioception exercises to improve safety.  Pt tolerated the addition of 2# ankle weights this week with exercise.  Pt requires stand by assistance for safety with standing strength and balance exercise. Pt will continue to benefit from skilled PT to address strength, balance and endurance and address pain as needed.    PT Frequency  2x / week    PT Duration  8 weeks    PT Treatment/Interventions  ADLs/Self Care Home Management;Cryotherapy;Electrical Stimulation;Moist Heat;Functional mobility training;Therapeutic activities;Therapeutic exercise;Patient/family education;Neuromuscular re-education;Manual techniques;Passive range of motion;Taping;Dry needling    PT Next Visit Plan  gluteal strength, flexbility, manual, balance/gait exercises    PT Home Exercise Plan  Access Code: [email or web address]    Consulted and Agree with Plan of Care  Patient       Patient will benefit from skilled therapeutic intervention in order to improve the following deficits and impairments:  Abnormal gait, Postural dysfunction, Pain, Decreased activity tolerance, Increased muscle spasms, Decreased endurance  Visit Diagnosis: Cramp and spasm  Chronic right-sided low back pain with right-sided sciatica  Abnormal posture     Problem List Patient Active Problem List   Diagnosis Date Noted  . Port-A-Cath in place 01/28/2018  . Genetic testing 10/18/2017  . Family history of cancer   . History of breast cancer   . Breast cancer of upper-outer quadrant of right female breast ([place]) 10/08/2017  . Osteopenia 09/21/2015  . Rapid heart rate 04/01/2015  . Atrial fibrillation ([place]) 04/01/2015  . Diabetes 1.5, managed as type 2 ([place]) 09/04/2014  . Breast  cancer, left breast ([place]) 08/30/2012  . Influenza 08/04/2011  . Gout 06/01/2011  . Vaginitis, atrophic 06/01/2011  . RENAL INSUFFICIENCY, ACUTE 05/28/2010  . HEMATURIA, HX OF 05/28/2010  . Disorder of bone and cartilage 12/08/2007  . Hyperlipidemia 08/10/2007  . Essential hypertension 08/10/2007     Sigurd Sos, PT 05/24/19 11:01  AM   [facility] [address], [place], [place], [address] Phone: [phone]   Fax:  [phone]  Name: Adrienne [name] MRN: [id number] Date of Birth: 07-10-1934

## 2019-05-25 ENCOUNTER — Encounter: Payer: Self-pay | Admitting: Hematology

## 2019-05-25 ENCOUNTER — Encounter: Payer: Self-pay | Admitting: Adult Health

## 2019-05-25 ENCOUNTER — Telehealth: Payer: Self-pay

## 2019-05-25 NOTE — Telephone Encounter (Signed)
Faxed signed order for US guided biopsy to [facility], received confirmation went through, sent to HIM for scan to chart.

## 2019-05-29 ENCOUNTER — Other Ambulatory Visit: Payer: Self-pay

## 2019-05-29 ENCOUNTER — Ambulatory Visit: Payer: Medicare PPO

## 2019-05-29 DIAGNOSIS — R252 Cramp and spasm: Secondary | ICD-10-CM

## 2019-05-29 DIAGNOSIS — R293 Abnormal posture: Secondary | ICD-10-CM

## 2019-05-29 DIAGNOSIS — G8929 Other chronic pain: Secondary | ICD-10-CM

## 2019-05-29 NOTE — Therapy (Signed)
[facility] [address], [place], [place], [address] Phone: [phone]   Fax:  [phone]  Physical Therapy Treatment  Patient Details  Name: Adrienne [name] MRN: [id number] Date of Birth: [birth year] Referring Provider (PT): Adrienne Rosenthal, MD   Encounter Date: 05/29/2019  PT End of Session - 05/29/19 1048    Visit Number  10    PT Start Time  8333    PT Stop Time  8329    PT Time Calculation (min)  39 min    Activity Tolerance  Patient tolerated treatment well    Behavior During Therapy  [facility] for tasks assessed/performed       Past Medical History:  Diagnosis Date  . Allergy   . Atrial fibrillation ([place])   . Breast cancer ([place]) 08/25/12   Invasive ductal ca,DCIS  . Diabetes mellitus    type II  . Family history of cancer   . Gout   . Hearing loss   . History of breast cancer   . History of frequent urinary tract infections   . History of radiation therapy 03/21/18- 04/19/18   Right Breast 2.67 Gy X 15 fractions, right breast boost 2 Gy X 5 fractions.   Marland Kitchen Hx of radiation therapy 11/22/12- 12/19/12   breast 4250 cGy 17 sessions, left breast boost 750 cGy 3 sessions  . Hyperlipidemia   . Hypertension   . Osteopenia     Past Surgical History:  Procedure Laterality Date  . ABDOMINAL HYSTERECTOMY Bilateral 1999   w/b/l salpingo-oopherectomy  . APPENDECTOMY    . BREAST LUMPECTOMY WITH NEEDLE LOCALIZATION AND AXILLARY SENTINEL LYMPH NODE BX Left 09/12/2012   Procedure: LEFT NEEDLE LOCALIZATION BREAST LUMPECTOMY TION AND AXILLARY SENTINEL LYMPH NODE BX;  Surgeon: Edward Jolly, MD;  Location: [place];  Service: General;  Laterality: Left;  . BREAST LUMPECTOMY WITH RADIOACTIVE SEED AND SENTINEL LYMPH NODE BIOPSY Right 10/26/2017   Procedure: BREAST LUMPECTOMY WITH RADIOACTIVE SEED AND SENTINEL LYMPH NODE BIOPSY;  Surgeon: Excell Seltzer, MD;  Location: [place];  Service: General;   Laterality: Right;  . BREAST SURGERY  1990's   right- fibroid cyst  . CHOLECYSTECTOMY    . SEPTOPLASTY    . TONSILLECTOMY      There were no vitals filed for this visit.  Subjective Assessment - 05/29/19 1017    Subjective  I am doing well.    Currently in Pain?  Yes    Pain Score  1     Pain Location  Buttocks    Pain Orientation  Right    Pain Descriptors / Indicators  Aching    Pain Type  Chronic pain    Pain Onset  More than a month ago    Pain Frequency  Intermittent    Aggravating Factors   sleep at night, sitting to read    Pain Relieving Factors  changing position, getting up and moving around.         [facility] PT Assessment - 05/29/19 0001      Assessment   Medical Diagnosis  Sciatica, Rt    Referring Provider (PT)  Adrienne Rosenthal, MD    Onset Date/Surgical Date  02/09/19      Prior Function   Level of Independence  Independent      Cognition   Overall Cognitive Status  Within Functional Limits for tasks assessed      Observation/Other Assessments   Focus on Therapeutic Outcomes (FOTO)  27% limitation                   [facility] Adult PT Treatment/Exercise - 05/29/19 0001      Lumbar Exercises: Stretches   Active Hamstring Stretch  Right;Left;3 reps;30 seconds    Active Hamstring Stretch Limitations  seated       Knee/Hip Exercises: Aerobic   Nustep  Level 2x 10 minutes   PT present to discuss progress     Knee/Hip Exercises: Standing   Hip Abduction  Stengthening;Both;10 reps;2 sets    Abduction Limitations  on foam pad 2# added    Hip Extension  Stengthening;Both;10 reps;2 sets    Extension Limitations  on foam pad, 2# added    Rocker Board  3 minutes    Other Standing Knee Exercises  on black pad: tandem stance bil 2x30 seconds and feet together 2x30 seconds               PT Short Term Goals - 05/10/19 1026      PT SHORT TERM GOAL #2   Title  sleep with 25% fewer sleep interruptions due to Rt gluteal without the use of  pain medication    Status  Achieved        PT Long Term Goals - 05/29/19 1018      PT LONG TERM GOAL #1   Title  be independent in advanced HEP    Time  8    Period  --    Status  Achieved      PT LONG TERM GOAL #2   Title  reduce FOTO to < or = to 23% limitation    Baseline  27% limitation    Status  Partially Met      PT LONG TERM GOAL #3   Title  report < or = to 2/10 Rt LE pain at night to allow for sleep without limitation    Baseline  when I first get up 7/10 and then resolves very quickly.  Not waking with pain    Time  8    Period  Weeks    Status  On-going      PT LONG TERM GOAL #4   Title  sit to read without limitation due to pain    Baseline  no pain    Status  Achieved            Plan - 05/29/19 1036    Clinical Impression Statement  Pt is making steady progress and reports 80% overall reduction in Rt gluteal pain.  Pt is able to sit and read without pain and is no longer waking up with pain.  Pt reports up to 7/10 Rt gluteal pain when she wakes up and this resolves quickly.  Pt is ready to discharge to HEP. Pt is active with exercises and goes to the gym at [facility] 3x/wk.  Pt will follow-up with MD as needed.    PT Next Visit Plan  D/C PT to HEP    PT Home Exercise Plan  Access Code: [email or web address]    Consulted and Agree with Plan of Care  Patient       Patient will benefit from skilled therapeutic intervention in order to improve the following deficits and impairments:     Visit Diagnosis: Cramp and spasm  Chronic right-sided low back pain with right-sided sciatica  Abnormal posture     Problem List Patient Active Problem List   Diagnosis Date Noted  . Port-A-Cath  in place 01/28/2018  . Genetic testing 10/18/2017  . Family history of cancer   . History of breast cancer   . Breast cancer of upper-outer quadrant of right female breast ([place]) 10/08/2017  . Osteopenia 09/21/2015  . Rapid heart rate 04/01/2015  . Atrial fibrillation ([place])  04/01/2015  . Diabetes 1.5, managed as type 2 ([place]) 09/04/2014  . Breast cancer, left breast ([place]) 08/30/2012  . Influenza 08/04/2011  . Gout 06/01/2011  . Vaginitis, atrophic 06/01/2011  . RENAL INSUFFICIENCY, ACUTE 05/28/2010  . HEMATURIA, HX OF 05/28/2010  . Disorder of bone and cartilage 12/08/2007  . Hyperlipidemia 08/10/2007  . Essential hypertension 08/10/2007   PHYSICAL THERAPY DISCHARGE SUMMARY  Visits from Start of Care: 10  Current functional level related to goals / functional outcomes: See above for current status.    Remaining deficits: Pt with Rt gluteal pain mostly in the morning hours and does not limit function.  Pt with balance deficits and has HEP in place to address.     Education / Equipment: HEP, balance education Plan: Patient agrees to discharge.  Patient goals were met. Patient is being discharged due to meeting the stated rehab goals.  ?????       Sigurd Sos, PT 05/29/19 10:52 AM    [facility] [address] [place], [place], [address] Phone: [phone]   Fax:  [phone]  Name: Adrienne [name] MRN: [id number] Date of Birth: 1934/05/14

## 2019-05-31 ENCOUNTER — Other Ambulatory Visit: Payer: Self-pay | Admitting: Radiology

## 2019-06-05 ENCOUNTER — Ambulatory Visit: Payer: Medicare PPO

## 2019-06-20 ENCOUNTER — Other Ambulatory Visit: Payer: Self-pay | Admitting: Adult Health

## 2019-06-20 DIAGNOSIS — E139 Other specified diabetes mellitus without complications: Secondary | ICD-10-CM

## 2019-06-23 ENCOUNTER — Other Ambulatory Visit: Payer: Self-pay | Admitting: Adult Health

## 2019-07-19 ENCOUNTER — Other Ambulatory Visit: Payer: Self-pay

## 2019-07-20 ENCOUNTER — Encounter: Payer: Self-pay | Admitting: Adult Health

## 2019-07-20 ENCOUNTER — Ambulatory Visit (INDEPENDENT_AMBULATORY_CARE_PROVIDER_SITE_OTHER): Payer: Medicare PPO | Admitting: Adult Health

## 2019-07-20 VITALS — BP 128/74 | Temp 97.9°F | Ht 61.0 in | Wt 122.0 lb

## 2019-07-20 DIAGNOSIS — E139 Other specified diabetes mellitus without complications: Secondary | ICD-10-CM

## 2019-07-20 DIAGNOSIS — I10 Essential (primary) hypertension: Secondary | ICD-10-CM | POA: Diagnosis not present

## 2019-07-20 DIAGNOSIS — Z0000 Encounter for general adult medical examination without abnormal findings: Secondary | ICD-10-CM

## 2019-07-20 DIAGNOSIS — E559 Vitamin D deficiency, unspecified: Secondary | ICD-10-CM

## 2019-07-20 DIAGNOSIS — E7800 Pure hypercholesterolemia, unspecified: Secondary | ICD-10-CM | POA: Diagnosis not present

## 2019-07-20 DIAGNOSIS — M1A9XX0 Chronic gout, unspecified, without tophus (tophi): Secondary | ICD-10-CM

## 2019-07-20 DIAGNOSIS — I4821 Permanent atrial fibrillation: Secondary | ICD-10-CM

## 2019-07-20 LAB — CBC WITH DIFFERENTIAL/PLATELET
Basophils Absolute: 0.1 10*3/uL (ref 0.0–0.1)
Basophils Relative: 0.8 % (ref 0.0–3.0)
Eosinophils Absolute: 0.1 10*3/uL (ref 0.0–0.7)
Eosinophils Relative: 0.9 % (ref 0.0–5.0)
HCT: 46.9 % — ABNORMAL HIGH (ref 36.0–46.0)
Hemoglobin: 15.4 g/dL — ABNORMAL HIGH (ref 12.0–15.0)
Lymphocytes Relative: 16.7 % (ref 12.0–46.0)
Lymphs Abs: 1.3 10*3/uL (ref 0.7–4.0)
MCHC: 32.9 g/dL (ref 30.0–36.0)
MCV: 98.4 fl (ref 78.0–100.0)
Monocytes Absolute: 0.7 10*3/uL (ref 0.1–1.0)
Monocytes Relative: 8.8 % (ref 3.0–12.0)
Neutro Abs: 5.9 10*3/uL (ref 1.4–7.7)
Neutrophils Relative %: 72.8 % (ref 43.0–77.0)
Platelets: 182 10*3/uL (ref 150.0–400.0)
RBC: 4.77 Mil/uL (ref 3.87–5.11)
RDW: 14.7 % (ref 11.5–15.5)
WBC: 8 10*3/uL (ref 4.0–10.5)

## 2019-07-20 LAB — COMPREHENSIVE METABOLIC PANEL
ALT: 28 U/L (ref 0–35)
AST: 26 U/L (ref 0–37)
Albumin: 4.3 g/dL (ref 3.5–5.2)
Alkaline Phosphatase: 102 U/L (ref 39–117)
BUN: 27 mg/dL — ABNORMAL HIGH (ref 6–23)
CO2: 29 mEq/L (ref 19–32)
Calcium: 10 mg/dL (ref 8.4–10.5)
Chloride: 101 mEq/L (ref 96–112)
Creatinine, Ser: 1.23 mg/dL — ABNORMAL HIGH (ref 0.40–1.20)
GFR: 41.75 mL/min — ABNORMAL LOW (ref >60.00)
Glucose, Bld: 176 mg/dL — ABNORMAL HIGH (ref 70–99)
Potassium: 4 mEq/L (ref 3.5–5.1)
Sodium: 140 mEq/L (ref 135–145)
Total Bilirubin: 0.8 mg/dL (ref 0.2–1.2)
Total Protein: 7 g/dL (ref 6.0–8.3)

## 2019-07-20 LAB — LIPID PANEL
Cholesterol: 257 mg/dL — ABNORMAL HIGH (ref 0–200)
HDL: 72.1 mg/dL (ref >39.00)
LDL Cholesterol: 158 mg/dL — ABNORMAL HIGH (ref 0–99)
NonHDL: 184.55
Total CHOL/HDL Ratio: 4
Triglycerides: 133 mg/dL (ref 0.0–149.0)
VLDL: 26.6 mg/dL (ref 0.0–40.0)

## 2019-07-20 LAB — VITAMIN D 25 HYDROXY (VIT D DEFICIENCY, FRACTURES): VITD: 49.04 ng/mL (ref 30.00–100.00)

## 2019-07-20 LAB — TSH: TSH: 2.5 u[IU]/mL (ref 0.35–4.50)

## 2019-07-20 LAB — HEMOGLOBIN A1C: Hgb A1c MFr Bld: 7.3 % — ABNORMAL HIGH (ref 4.6–6.5)

## 2019-07-20 MED ORDER — GABAPENTIN 100 MG PO CAPS: 100.0000 mg | ORAL_CAPSULE | Freq: Every day | ORAL | 1 refills | Status: DC

## 2019-07-20 MED ORDER — ALLOPURINOL 300 MG PO TABS: 300.0000 mg | ORAL_TABLET | Freq: Every day | ORAL | 3 refills | Status: DC

## 2019-07-20 NOTE — Progress Notes (Signed)
Subjective:    Patient ID: Adrienne [name], female    DOB: 1935-02-24, 85 y.o.   MRN: [id number]  HPI Patient presents for yearly preventative medicine examination. She is a pleasant and remarkable 85 year old female who  has a past medical history of Allergy, Atrial fibrillation ([place]), Breast cancer ([place]) (08/25/12), Diabetes mellitus, Family history of cancer, Gout, Hearing loss, History of breast cancer, History of frequent urinary tract infections, History of radiation therapy (03/21/18- 04/19/18), radiation therapy (11/22/12- 12/19/12), Hyperlipidemia, Hypertension, and Osteopenia.  DM -she is currently maintained and controlled on metformin 500 mg twice daily.  She does monitor her blood sugars at home and reports readings between 120 and 150.  She denies any hypoglycemic events. Lab Results  Component Value Date   HGBA1C 6.7 04/20/2019   Essential Hypertension -early prescribed Cardizem and atenolol.  She denies dizziness, lightheadedness, chest pain, or shortness of breath BP Readings from Last 3 Encounters:  07/20/19 128/74  05/02/19 134/77  04/20/19 126/72   PAF -she remains on Xarelto 5 mg twice daily and Cardizem  Hyperlipidemia - Conservative management  Lab Results  Component Value Date   CHOL 246 (H) 04/29/2018   HDL 64.00 04/29/2018   LDLCALC 152 (H) 04/29/2018   LDLDIRECT 124.0 07/22/2006   TRIG 148.0 04/29/2018   CHOLHDL 4 04/29/2018   Gout - takes allopurinol 300 mg daily - no recent gour flares.   H/o breast cancer - is seen by Oncology every 6 months. Has completed treatment.   Vitamin D Deficiency - takes 1000 units daily.   All immunizations and health maintenance protocols were reviewed with the patient and needed orders were placed. She is up to date on routine vaccinations   Appropriate screening laboratory values were ordered for the patient including screening of hyperlipidemia, renal function and hepatic function.  Medication reconciliation,  past  medical history, social history, problem list and allergies were reviewed in detail with the patient  Goals were established with regard to weight loss, exercise, and  diet in compliance with medications.  He continues to walk 2-1/2 miles per day and is eating a heart healthy diet  End of life planning was discussed.  She does have a living will and advanced directive  She has no acute issues   Review of Systems  Constitutional: Negative.   HENT: Positive for hearing loss.   Eyes: Negative.   Respiratory: Negative.   Cardiovascular: Negative.   Gastrointestinal: Negative.   Endocrine: Negative.   Genitourinary: Negative.   Musculoskeletal: Positive for arthralgias.  Skin: Negative.   Allergic/Immunologic: Negative.   Neurological: Negative.   Hematological: Negative.   Psychiatric/Behavioral: Negative.    Past Medical History:  Diagnosis Date  . Allergy   . Atrial fibrillation ([place])   . Breast cancer ([place]) 08/25/12   Invasive ductal ca,DCIS  . Diabetes mellitus    type II  . Family history of cancer   . Gout   . Hearing loss   . History of breast cancer   . History of frequent urinary tract infections   . History of radiation therapy 03/21/18- 04/19/18   Right Breast 2.67 Gy X 15 fractions, right breast boost 2 Gy X 5 fractions.   Marland Kitchen Hx of radiation therapy 11/22/12- 12/19/12   breast 4250 cGy 17 sessions, left breast boost 750 cGy 3 sessions  . Hyperlipidemia   . Hypertension   . Osteopenia     Social History   Socioeconomic History  . Marital status:  Married    Spouse name: Not on file  . Number of children: 3  . Years of education: Not on file  . Highest education level: Not on file  Occupational History  . Not on file  Tobacco Use  . Smoking status: Former Smoker    Packs/day: 0.25    Years: 4.00    Pack years: 1.00    Types: Cigarettes  . Smokeless tobacco: Never Used  . Tobacco comment: Cigarette use was 50 years ago  Substance and Sexual Activity  .  Alcohol use: Not Currently  . Drug use: No  . Sexual activity: Never    Comment: menarche age 59, 64st birth age 21, G25, HRT x 7 years?  Other Topics Concern  . Not on file  Social History Narrative   Married - husband in memory care unit    Three children ( One lives at [place] and two in [place]      She likes to read and go to [employer]    Social Determinants of Health   Financial Resource Strain:   . Difficulty of Paying Living Expenses:   Food Insecurity:   . Worried About Charity fundraiser in the Last Year:   . Arboriculturist in the Last Year:   Transportation Needs:   . Film/video editor (Medical):   Marland Kitchen Lack of Transportation (Non-Medical):   Physical Activity:   . Days of Exercise per Week:   . Minutes of Exercise per Session:   Stress:   . Feeling of Stress :   Social Connections:   . Frequency of Communication with Friends and Family:   . Frequency of Social Gatherings with Friends and Family:   . Attends Religious Services:   . Active Member of Clubs or Organizations:   . Attends Archivist Meetings:   Marland Kitchen Marital Status:   Intimate Partner Violence:   . Fear of Current or Ex-Partner:   . Emotionally Abused:   Marland Kitchen Physically Abused:   . Sexually Abused:     Past Surgical History:  Procedure Laterality Date  . ABDOMINAL HYSTERECTOMY Bilateral 1999   w/b/l salpingo-oopherectomy  . APPENDECTOMY    . BREAST LUMPECTOMY WITH NEEDLE LOCALIZATION AND AXILLARY SENTINEL LYMPH NODE BX Left 09/12/2012   Procedure: LEFT NEEDLE LOCALIZATION BREAST LUMPECTOMY TION AND AXILLARY SENTINEL LYMPH NODE BX;  Surgeon: Edward Jolly, MD;  Location: [facility];  Service: General;  Laterality: Left;  . BREAST LUMPECTOMY WITH RADIOACTIVE SEED AND SENTINEL LYMPH NODE BIOPSY Right 10/26/2017   Procedure: BREAST LUMPECTOMY WITH RADIOACTIVE SEED AND SENTINEL LYMPH NODE BIOPSY;  Surgeon: Excell Seltzer, MD;  Location: [facility];  Service: General;   Laterality: Right;  . BREAST SURGERY  1990's   right- fibroid cyst  . CHOLECYSTECTOMY    . SEPTOPLASTY    . TONSILLECTOMY      Family History  Problem Relation Age of Onset  . Hypertension Mother   . Heart disease Mother        Murmur and irregular heart disease  . Dementia Mother        d. 42  . Hypertension Father   . Aneurysm Father 84  . Breast cancer Cousin 43       mat first cousin    Allergies  Allergen Reactions  . Penicillins Hives    Has patient had a PCN reaction causing immediate rash, facial/tongue/throat swelling, SOB or lightheadedness with hypotension: No Has patient had a PCN reaction  causing severe rash involving mucus membranes or skin necrosis: No Has patient had a PCN reaction that required hospitalization: No Has patient had a PCN reaction occurring within the last 10 years: No If all of the above answers are "NO", then may proceed with Cephalosporin use.  . Other Hives  . Prednisone Other (See Comments)    ELEVATED BLOOD SUGAR  . Radiaplexrx [Woun'Dres Hydrogel Wound Dress] Hives  . Sulfamethoxazole Nausea Only    Current Outpatient Medications on File Prior to Visit  Medication Sig Dispense Refill  . allopurinol (ZYLOPRIM) 300 MG tablet TAKE 1 TABLET BY MOUTH EVERY DAY 90 tablet 0  . apixaban (ELIQUIS) 2.5 MG TABS tablet Take 1 tablet (2.5 mg total) by mouth 2 (two) times daily. 180 tablet 1  . atenolol (TENORMIN) 25 MG tablet TAKE 1 TABLET BY MOUTH EVERY DAY 90 tablet 1  . Blood Glucose Monitoring Suppl (BLOOD GLUCOSE MONITOR SYSTEM) w/Device KIT Accu Check Aviva Meter 1 kit 0  . chlorpheniramine (CHLOR-TRIMETON) 4 MG tablet Take 4 mg 2 (two) times daily as needed by mouth for allergies.    Marland Kitchen diltiazem (CARDIZEM CD) 360 MG 24 hr capsule Take 1 capsule (360 mg total) by mouth daily. 90 capsule 1  . fish oil-omega-3 fatty acids 1000 MG capsule Take 1 g by mouth daily.    Marland Kitchen glucose blood (ACCU-CHEK AVIVA PLUS) test strip USE TO TEST TWICE DAILY  **ICD-10 CODE E13.9** 200 each 3  . metFORMIN (GLUCOPHAGE) 500 MG tablet TAKE 1 TABLET BY MOUTH TWICE A DAY 180 tablet 0  . Multiple Vitamin (MULTIVITAMIN) tablet Take 1 tablet by mouth daily.    Marland Kitchen OVER THE COUNTER MEDICATION Take by mouth 2 (two) times daily. Presavision- for macular degeneration prevention    . TOBREX 0.3 % ophthalmic ointment APPLY A SMALL AMOUNT ON EYELID THREE TIMES A DAY AS NEEDED    . VITAMIN D, ERGOCALCIFEROL, PO Take by mouth.    . gabapentin (NEURONTIN) 100 MG capsule Take 1 capsule (100 mg total) by mouth at bedtime. (Patient not taking: Reported on 07/20/2019) 30 capsule 3   No current facility-administered medications on file prior to visit.    BP 128/74   Temp 97.9 F (36.6 C) (Temporal)   Ht _0  (1.549 m)   Wt 122 lb (55.3 kg)   BMI 23.05 kg/m       Objective:   Physical Exam Vitals and nursing note reviewed.  Constitutional:      General: She is not in acute distress.    Appearance: Normal appearance. She is well-developed. She is not ill-appearing.  HENT:     Head: Normocephalic and atraumatic.     Right Ear: Tympanic membrane, ear canal and external ear normal. There is no impacted cerumen.     Left Ear: Tympanic membrane, ear canal and external ear normal. There is no impacted cerumen.     Nose: Nose normal. No congestion or rhinorrhea.     Mouth/Throat:     Mouth: Mucous membranes are moist.     Dentition: Has dentures.     Pharynx: Oropharynx is clear. No oropharyngeal exudate or posterior oropharyngeal erythema.  Eyes:     General:        Right eye: No discharge.        Left eye: No discharge.     Extraocular Movements: Extraocular movements intact.     Conjunctiva/sclera: Conjunctivae normal.     Pupils: Pupils are equal, round, and reactive to light.  Neck:     Thyroid: No thyromegaly.     Vascular: No carotid bruit.     Trachea: No tracheal deviation.  Cardiovascular:     Rate and Rhythm: Normal rate. Rhythm irregularly  irregular.     Pulses: Normal pulses.     Heart sounds: Normal heart sounds. No murmur. No friction rub. No gallop.   Pulmonary:     Effort: Pulmonary effort is normal. No respiratory distress.     Breath sounds: Normal breath sounds. No stridor. No wheezing, rhonchi or rales.  Chest:     Chest wall: No tenderness.  Abdominal:     General: Abdomen is flat. Bowel sounds are normal. There is no distension.     Palpations: Abdomen is soft. There is no mass.     Tenderness: There is no abdominal tenderness. There is no right CVA tenderness, left CVA tenderness, guarding or rebound.     Hernia: No hernia is present.  Musculoskeletal:        General: No swelling, tenderness, deformity or signs of injury. Normal range of motion.     Cervical back: Normal range of motion and neck supple.     Right lower leg: No edema.     Left lower leg: No edema.  Lymphadenopathy:     Cervical: No cervical adenopathy.  Skin:    General: Skin is warm and dry.     Coloration: Skin is not jaundiced or pale.     Findings: No bruising, erythema, lesion or rash.  Neurological:     General: No focal deficit present.     Mental Status: She is alert and oriented to person, place, and time.     Cranial Nerves: No cranial nerve deficit.     Sensory: No sensory deficit.     Motor: No weakness.     Coordination: Coordination normal.     Gait: Gait normal.     Deep Tendon Reflexes: Reflexes normal.  Psychiatric:        Mood and Affect: Mood normal.        Behavior: Behavior normal.        Thought Content: Thought content normal.        Judgment: Judgment normal.       Assessment & Plan:  1. Routine general medical examination at a health care facility - Continue to walk and eat healthy  - Follow up in one year or sooner if needed  - CBC with Differential/Platelet - Comprehensive metabolic panel - Hemoglobin A1c - Lipid panel - TSH  2. Diabetes 1.5, managed as type 2 ([place]) - Consider increase in  metformin  - CBC with Differential/Platelet - Comprehensive metabolic panel - Hemoglobin A1c - Lipid panel - TSH - Microalbumin/Creatinine Ratio, Urine  3. Pure hypercholesterolemia - Consider statin  - CBC with Differential/Platelet - Comprehensive metabolic panel - Hemoglobin A1c - Lipid panel - TSH  4. Essential hypertension - Well controlled. No change in medications  - CBC with Differential/Platelet - Comprehensive metabolic panel - Hemoglobin A1c - Lipid panel - TSH  5. Permanent atrial fibrillation ([facility]) - Continue Eliquis and Cardizem  - Follow up with Cardiology as directed - CBC with Differential/Platelet - Comprehensive metabolic panel - Hemoglobin A1c - Lipid panel - TSH  6. Chronic gout without tophus, unspecified cause, unspecified site  - allopurinol (ZYLOPRIM) 300 MG tablet; Take 1 tablet (300 mg total) by mouth daily.  Dispense: 90 tablet; Refill: 3  7. Vitamin D deficiency  - Vitamin D,  25-hydroxy  [name], NP

## 2019-07-20 NOTE — Patient Instructions (Signed)
It was great seeing you today!   We will follow up with you regarding your blood work   Please keep doing what you are doing!

## 2019-07-20 NOTE — Addendum Note (Signed)
Addended by: Suzette Battiest on: 07/20/2019 08:49 AM   Modules accepted: Orders

## 2019-07-24 MED ORDER — ROSUVASTATIN CALCIUM 5 MG PO TABS: 5.0000 mg | ORAL_TABLET | Freq: Every day | ORAL | 1 refills | Status: DC

## 2019-07-24 NOTE — Addendum Note (Signed)
Addended by: Agnes Lawrence on: 07/24/2019 01:45 PM   Modules accepted: Orders

## 2019-09-12 ENCOUNTER — Other Ambulatory Visit: Payer: Self-pay | Admitting: Adult Health

## 2019-09-15 ENCOUNTER — Other Ambulatory Visit: Payer: Self-pay | Admitting: Adult Health

## 2019-09-15 DIAGNOSIS — E139 Other specified diabetes mellitus without complications: Secondary | ICD-10-CM

## 2019-09-15 NOTE — Telephone Encounter (Signed)
NEEDS OV FOR DIABETES

## 2019-09-20 NOTE — Telephone Encounter (Signed)
Left a message for a return call.

## 2019-09-20 NOTE — Telephone Encounter (Signed)
Sent for 90 days.  Patient is scheduled for ov.

## 2019-09-26 ENCOUNTER — Telehealth: Payer: Self-pay

## 2019-09-26 NOTE — Telephone Encounter (Signed)
Orders for diagnostic mammogram with ultrasound if necessary faxed to [facility] at [phone]

## 2019-10-06 ENCOUNTER — Other Ambulatory Visit: Payer: Self-pay | Admitting: Cardiology

## 2019-10-10 ENCOUNTER — Encounter: Payer: Self-pay | Admitting: Adult Health

## 2019-10-10 LAB — HM MAMMOGRAPHY: HM Mammogram: ABNORMAL — AB (ref 0–4)

## 2019-10-12 ENCOUNTER — Encounter: Payer: Self-pay | Admitting: Family Medicine

## 2019-10-18 NOTE — Progress Notes (Signed)
[place]   Telephone:([phone]) [phone] Fax:(336) [phone]   Clinic Follow up Note   Patient Care Team: [name], NP as PCP - General (Family Medicine) Stanford Breed Denice Bors, MD as PCP - Cardiology (Cardiology)  Date of Service:  10/30/2019  CHIEF COMPLAINT: Follow up for right breast cancer, triple negative  SUMMARY OF ONCOLOGIC HISTORY: Oncology History Overview Note  Cancer Staging Breast cancer of upper-outer quadrant of right female breast [facility]) Staging form: Breast, AJCC 8th Edition - Clinical stage from 09/22/2017: Stage IB (cT1b, cN0, cM0, G2, ER-, PR-, HER2-) - Signed by Truitt Merle, MD on 10/08/2017  Breast cancer, left breast [facility]) Staging form: Breast, AJCC 7th Edition - Clinical stage from 09/12/2012: Stage IA (T1b, N0, M0) - Unsigned      Breast cancer, left breast ([place])  08/25/2012 Receptors her2   ER 100% positive, PR 88% positive, HER-2 negative, Ki-67 16%   08/30/2012 Initial Diagnosis   Breast cancer, left breast   09/12/2012 Pathology Results   T1bN0 Grade 1 invasive ductal carcinoma, and DCIS.   09/12/2012 Surgery   Left breast lumpectomy and sentinel lymph node biopsy, negative margins.   11/09/2012 - 12/13/2012 Radiation Therapy   Adjuvant breast radiation   12/2012 - 11/2017 Anti-estrogen oral therapy   Anastrozole 1 mg daily, switched to Aromasin 25 mg daily in Jan 2015 due to diarrhea. Her Exemestane was switched to letrozole in 08/2017 due to high copay. She completed her 5 years in 11/2017.    09/01/2016 Imaging   [name] Outside films Breast form [facility] 09/01/2016 IMPRESSION: Probably Benign 1. No significant change in oval hypoechoic masses in the left breast at 11:00 2 cm from the nipple and 10:30 4 cm from the nipple. Both of these masses resemble the area of fat necrosis previously biopsies at 2:00. In addition, both masses have developing central benign or dystrophic appearing calcifications and are favored to be other areas of fat necrosis. A 6  month follow-up left breast mammogram and ultrasound is recommended.   09/01/2016 Mammogram   [facility] Mammogram from [facility] 09/01/16 IMPRESSION  Incomplete - additional imaging evaluation needed Ultrasound of the upper medial left breast mass is recommended.    03/09/2017 Mammogram   Previous lumpectomy changes in the upper outer left breast anterior depth with stable associated dystrophic calcifications.  Biopsy clip at the 2:00 in the left breast near the lumpectomy site corresponding to previously biopsied benign fat necrosis.  Persistent round mass in the upper medial left breast with benign-appearing central round calcification.  No significant masses, calcifications, or other findings are seen in the breast  Impression: ultrasound is recommended for the left breast   03/09/2017 Breast US   Left breast ultrasound impression:  No significant change in oval hypoechoic masses in the left breast at 11:00 2 cm from the nipple and 10:30 4 cm from the nipple.  Both of these masses resemble the area of fat necrosis previously biopsied at 2:00.  In addition, both masses have developing central benign or dystrophic appearing calcifications and are favored to be other areas of fat necrosis.  A 75-monthfollow-up bilateral mammogram and left breast ultrasound is recommended.   09/14/2017 Breast [place]  Breast [place] 09[place]14[place] at [facility]  IMPRESSION:  The 1 cm oval mass in the left breast at 9:30 posterior depth is highly suggestive of malignancy. An Ultrasound guided biopsy is recommended.  The 0.8 cm round mass in the left breast at 11-12 o'clock anterior depth is suspicious  of malignancy. An ultrasound guided biopsy is recommended.    09/22/2017 Pathology Results   Diagnosis 1. Breast, right, needle core biopsy - INVASIVE DUCTAL CARCINOMA, MSBR GRADE II/III. - DUCTAL CARCINOMA IN SITU WITH NECROSIS. 2. Breast, left, needle core biopsy - FAT NECROSIS. - NO EVIDENCE OF MALIGNANCY.   10/17/2017 Genetic  Testing   Negative genetic testing on the Multicancer panel.  The Multi-Gene Panel offered by [facility] includes sequencing and/or deletion duplication testing of the following 83 genes: ALK, APC, ATM, AXIN2,BAP1,  BARD1, BLM, BMPR1A, BRCA1, BRCA2, BRIP1, CASR, CDC73, CDH1, CDK4, CDKN1B, CDKN1C, CDKN2A (p14ARF), CDKN2A (p16INK4a), CEBPA, CHEK2, CTNNA1, DICER1, DIS3L2, EGFR (c.2369C>T, p.Thr790Met variant only), EPCAM (Deletion/duplication testing only), FH, FLCN, GATA2, GPC3, GREM1 (Promoter region deletion/duplication testing only), HOXB13 (c.251G>A, p.Gly84Glu), HRAS, KIT, MAX, MEN1, MET, MITF (c.952G>A, p.Glu318Lys variant only), MLH1, MSH2, MSH3, MSH6, MUTYH, NBN, NF1, NF2, NTHL1, PALB2, PDGFRA, PHOX2B, PMS2, POLD1, POLE, POT1, PRKAR1A, PTCH1, PTEN, RAD50, RAD51C, RAD51D, RB1, RECQL4, RET, RUNX1, SDHAF2, SDHA (sequence changes only), SDHB, SDHC, SDHD, SMAD4, SMARCA4, SMARCB1, SMARCE1, STK11, SUFU, TERT, TERT, TMEM127, TP53, TSC1, TSC2, VHL, WRN and WT1.  The report date is October 17, 2017.   Breast cancer of upper-outer quadrant of right female breast ([place])  09/14/2017 Mammogram   Diagnostic mammogram at [facility]  IMPRESSION:  The new 0.9 cm oval high density mass in the right breast posterior depth superior region seen on the mediolateral oblique view only is indeterminate. The 1.1 cm focal asymmetry in the left breast central to the nipple anterior depth is indeterminate.    09/14/2017 Imaging   [name] Bilateral at [facility] IMPRESSION: The 1 cm oval mass in the right breast at 9:30 posterior depth is highly suggestive of malignancy. The 0.8 cm round mass in the left breast at 11-12 o'clock anterior depth is suspicious of malignancy.    09/22/2017 Cancer Staging   Staging form: Breast, AJCC 8th Edition - Clinical stage from 09/22/2017: Stage IB (cT1b, cN0, cM0, G2, ER-, PR-, HER2-) - Signed by Truitt Merle, MD on 10/08/2017   09/22/2017 Pathology Results   Bilateral needle core biopsy 1. Breast, right, needle core  biopsy - INVASIVE DUCTAL CARCINOMA, MSBR GRADE II/III. - DUCTAL CARCINOMA IN SITU WITH NECROSIS. 2. Breast, left, needle core biopsy - FAT NECROSIS. - NO EVIDENCE OF MALIGNANCY.   09/25/2017 Receptors her2   Estrogen Receptor: 0 Progesterone 0 HER2: Negative. Ki-67: 80%    10/08/2017 Initial Diagnosis   Breast cancer of upper-outer quadrant of right female breast ([place])   10/26/2017 Surgery   BREAST LUMPECTOMY WITH RADIOACTIVE SEED AND SENTINEL LYMPH NODE BIOPSY by Dr. Excell Seltzer  10/26/17   10/26/2017 Pathology Results   Diagnosis 10/26/17 1. Breast, lumpectomy, Right - INVASIVE DUCTAL CARCINOMA, GRADE 2, SPANNING 1 CM. - HIGH GRADE DUCTAL CARCINOMA IN SITU WITH NECROSIS. - FINAL RESECTION MARGINS (PARTS #2, 3, 4) ARE NEGATIVE. - BIOPSY SITE. - SEE ONCOLOGY TABLE. 2. Breast, excision, additional anterior margin- 2 pieces right - BENIGN BREAST TISSUE. 3. Breast, excision, Right unoriented anterior margin - BENIGN BREAST TISSUE. 4. Breast, excision, Right deep margin - BENIGN BREAST TISSUE. 5. Lymph node, sentinel, biopsy, Right Axillary - ONE OF ONE LYMPH NODES NEGATIVE FOR CARCINOMA (0/1).   10/26/2017 Cancer Staging   Staging form: Breast, AJCC 8th Edition - Pathologic stage from 10/26/2017: Stage IB (pT1b, pN0, cM0, G2, ER-, PR-, HER2-) - Signed by Truitt Merle, MD on 11/12/2017   12/03/2017 - 02/25/2018 Chemotherapy   Weekly Abraxane for 12 weeks 12/03/17-02/25/18  03/2018 - 04/19/2018 Radiation Therapy   With Dr. Basilio Cairo      CURRENT THERAPY:  Surveillance  INTERVAL HISTORY:  Adrienne [name] is here for a follow up of right breast cancer. She was last seen by me 6 months ago. She presents to the clinic alone. She notes she is doing well. She notes she has been having sciatic pain in her lower right back and hip and down her leg. She has had this pain since 03/2019. She noes elevated her right leg when sitting makes her pain more manageable. She is seen by orthopedic surgeon  Dr Magnus Ivan and work up was done. She notes her pain is not present after walking for some time.     REVIEW OF SYSTEMS:   Constitutional: Denies fevers, chills or abnormal weight loss Eyes: Denies blurriness of vision Ears, nose, mouth, throat, and face: Denies mucositis or sore throat Respiratory: Denies cough, dyspnea or wheezes Cardiovascular: Denies palpitation, chest discomfort or lower extremity swelling Gastrointestinal:  Denies nausea, heartburn or change in bowel habits Skin: Denies abnormal skin rashes MSK: (+) Sciatic pain in right lower back and hip Lymphatics: Denies new lymphadenopathy or easy bruising Neurological:Denies numbness, tingling or new weaknesses Behavioral/Psych: Mood is stable, no new changes  All other systems were reviewed with the patient and are negative.  MEDICAL HISTORY:  Past Medical History:  Diagnosis Date  . Allergy   . Atrial fibrillation (HCC)   . Breast cancer (HCC) 08/25/12   Invasive ductal ca,DCIS  . Diabetes mellitus    type II  . Family history of cancer   . Gout   . Hearing loss   . History of breast cancer   . History of frequent urinary tract infections   . History of radiation therapy 03/21/18- 04/19/18   Right Breast 2.67 Gy X 15 fractions, right breast boost 2 Gy X 5 fractions.   Marland Kitchen Hx of radiation therapy 11/22/12- 12/19/12   breast 4250 cGy 17 sessions, left breast boost 750 cGy 3 sessions  . Hyperlipidemia   . Hypertension   . Osteopenia     SURGICAL HISTORY: Past Surgical History:  Procedure Laterality Date  . ABDOMINAL HYSTERECTOMY Bilateral 1999   w/b/l salpingo-oopherectomy  . APPENDECTOMY    . BREAST LUMPECTOMY WITH NEEDLE LOCALIZATION AND AXILLARY SENTINEL LYMPH NODE BX Left 09/12/2012   Procedure: LEFT NEEDLE LOCALIZATION BREAST LUMPECTOMY TION AND AXILLARY SENTINEL LYMPH NODE BX;  Surgeon: Mariella Saa, MD;  Location: [facility] OR;  Service: General;  Laterality: Left;  . BREAST LUMPECTOMY WITH RADIOACTIVE SEED  AND SENTINEL LYMPH NODE BIOPSY Right 10/26/2017   Procedure: BREAST LUMPECTOMY WITH RADIOACTIVE SEED AND SENTINEL LYMPH NODE BIOPSY;  Surgeon: Glenna Fellows, MD;  Location: [facility];  Service: General;  Laterality: Right;  . BREAST SURGERY  1990's   right- fibroid cyst  . CHOLECYSTECTOMY    . SEPTOPLASTY    . TONSILLECTOMY      I have reviewed the social history and family history with the patient and they are unchanged from previous note.  ALLERGIES:  is allergic to penicillins, other, prednisone, radiaplexrx [woun'dres hydrogel wound dress], and sulfamethoxazole.  MEDICATIONS:  Current Outpatient Medications  Medication Sig Dispense Refill  . allopurinol (ZYLOPRIM) 300 MG tablet Take 1 tablet (300 mg total) by mouth daily. 90 tablet 3  . atenolol (TENORMIN) 25 MG tablet Take 1 tablet (25 mg total) by mouth daily. NEED OV. 30 tablet 0  . Blood Glucose Monitoring Suppl (BLOOD GLUCOSE  MONITOR SYSTEM) w/Device KIT Accu Check Aviva Meter 1 kit 0  . chlorpheniramine (CHLOR-TRIMETON) 4 MG tablet Take 4 mg 2 (two) times daily as needed by mouth for allergies.    Marland Kitchen diltiazem (CARDIZEM CD) 360 MG 24 hr capsule Take 1 capsule (360 mg total) by mouth daily. NEED OV. 30 capsule 2  . ELIQUIS 2.5 MG TABS tablet TAKE 1 TABLET BY MOUTH TWICE A DAY 180 tablet 1  . fish oil-omega-3 fatty acids 1000 MG capsule Take 1 g by mouth daily.    Marland Kitchen gabapentin (NEURONTIN) 100 MG capsule Take 1 capsule (100 mg total) by mouth at bedtime. 90 capsule 1  . glucose blood (ACCU-CHEK AVIVA PLUS) test strip USE TO TEST TWICE DAILY **ICD-10 CODE E13.9** 200 each 3  . metFORMIN (GLUCOPHAGE) 500 MG tablet TAKE 1 TABLET BY MOUTH TWICE A DAY 180 tablet 0  . Multiple Vitamin (MULTIVITAMIN) tablet Take 1 tablet by mouth daily.    Marland Kitchen OVER THE COUNTER MEDICATION Take by mouth 2 (two) times daily. Presavision- for macular degeneration prevention    . rosuvastatin (CRESTOR) 5 MG tablet Take 1 tablet (5 mg total) by  mouth daily. 90 tablet 1  . TOBREX 0.3 % ophthalmic ointment APPLY A SMALL AMOUNT ON EYELID THREE TIMES A DAY AS NEEDED    . VITAMIN D, ERGOCALCIFEROL, PO Take by mouth.     No current facility-administered medications for this visit.    PHYSICAL EXAMINATION: ECOG PERFORMANCE STATUS: 0 - Asymptomatic  Vitals:   10/30/19 0929 10/30/19 0930  BP: (!) 147/72 (!) 145/73  Pulse: 65   Resp: 17   Temp: (!) 97.5 F (36.4 C)   SpO2: 96%    Filed Weights   10/30/19 0929  Weight: 129 lb 4.8 oz (58.7 kg)    GENERAL:alert, no distress and comfortable SKIN: skin color, texture, turgor are normal, no rashes or significant lesions EYES: normal, Conjunctiva are pink and non-injected, sclera clear  NECK: supple, thyroid normal size, non-tender, without nodularity LYMPH:  no palpable lymphadenopathy in the cervical, axillary  LUNGS: clear to auscultation and percussion with normal breathing effort HEART: regular rate & rhythm and no murmurs and no lower extremity edema ABDOMEN:abdomen soft, non-tender and normal bowel sounds Musculoskeletal:no cyanosis of digits and no clubbing  NEURO: alert & oriented x 3 with fluent speech, no focal motor/sensory deficits BREAST: S/p b/l lumpectomy: Surgical incisions healed well with b/l  scar tissue and long ling of small nodules of lateral right breast along incision. Breast exam benign.   LABORATORY DATA:  I have reviewed the data as listed CBC Latest Ref Rng & Units 10/30/2019 07/20/2019 05/02/2019  WBC 4.0 - 10.5 K/uL 7.6 8.0 7.0  Hemoglobin 12.0 - 15.0 g/dL 14.1 15.4(H) 14.0  Hematocrit 36 - 46 % 43.1 46.9(H) 43.5  Platelets 150 - 400 K/uL 156 182.0 171     CMP Latest Ref Rng & Units 10/30/2019 07/20/2019 05/02/2019  Glucose 70 - 99 mg/dL 198(H) 176(H) 162(H)  BUN 8 - 23 mg/dL 40(H) 27(H) 30(H)  Creatinine 0.44 - 1.00 mg/dL 1.21(H) 1.23(H) 1.24(H)  Sodium 135 - 145 mmol/L 141 140 142  Potassium 3.5 - 5.1 mmol/L 4.1 4.0 4.2  Chloride 98 - 111  mmol/L 108 101 104  CO2 22 - 32 mmol/L 20(L) 29 27  Calcium 8.9 - 10.3 mg/dL 9.7 10.0 9.2  Total Protein 6.5 - 8.1 g/dL 7.0 7.0 6.8  Total Bilirubin 0.3 - 1.2 mg/dL 0.4 0.8 0.5  Alkaline Phos 38 -  126 U/L 90 102 96  AST 15 - 41 U/L '21 26 22  '$ ALT 0 - 44 U/L '22 28 22      '$ RADIOGRAPHIC STUDIES: I have personally reviewed the radiological images as listed and agreed with the findings in the report. No results found.   ASSESSMENT & PLAN:  ANNALYCE LANPHER is a 85 y.o. female with    1. Breast cancer of upper outer quadrant of right breast, invasive ductal carcinoma, stage IB, pT1bN0M0, grade 2, Triple Negative, Ki67: 80% -She was diagnosed in 09/2017. She is s/p right lumpectomy with SLNB, 12 weeks of adjuvant Abraxane and adjuvant radiation.  -Her genetic testing was negative -Her 05/2019 Mammogram showed 1.1cm mass in her right breast. She had a right breast biopsy on 05/31/19 which showed benign fat necrosis.  -She is clinically doing well. Lab reviewed, her CBC and CMP are within normal limits except BG 198, BUN 40, Cr 1.21. Her physical exam today shows b/l scar tissue and a long line of small nodule of lateral right breast at incision, likely benign as seen on 05/2019 biopsy. There is no clinical concern for recurrence. -Continue Surveillance. Will repeat mammogram in 10/2020.  -F/u in 6 months   2. pT1bN0, stage IA invasive ductal carcinoma of the left breast, grade I, ER 100%, PR 88%, Ki-67 16%, HER-2/neu negative. -She was diagnosed in 09/2012. She is s/p left lumpectomy with SLNB on 09/12/2012, s/p radiation therapy from 11/09/2012-12/19/2012.  -She was on adjuvant Exemestane since 12/2012 which was switched to exemestane in 2019. She completed in 11/2017.  -She had a breast mammogram on 10/10/19 which was abnormal. Her left breast biopsy on 10/25/19 showed benign fat necrosis with calcifications.   3. Osteopenia -03/2015 DEXA showed osteopenia, with 10 year probability of major ostial  parotic fracture 25%, and hip fracture 15%.  -Given the high risk of fracture, she would benefit from bisphosphonate or Prolia injection. She has not done Prolia injection yet and she is hesitant to due to the concern of side effects. -She is on vitamin D supplement daily. No calcium given her hypercalcemia.  -Her 09/2017 DEXA from [facility] showed Osteopenia (-1.9 at right hip), very high risk of fracture, 29% for major osteoporotic fracture and 18% for hip fracture.  -Next DEXA in 3 weeks. If worse, would consider bisphosphonate   4. Hypercalcemia  -She knows to drink adequately and avoid dehydration  -She was previously instructed to restart Vit D in 03/2017 as well as continue light weight bearing exercise.  -Ca normal lately.   5. Diabetes -She had elevated blood sugar when on steroids for hip pain, DC'd steroids -She is currently on metformin.  -Strongly encouraged her to follow-up with PCP  6. Social Support -She lives at [facility] and is very independent -Her children live close by her  7. Sciatic nerve pain or Arthritis  -She has right lower back and hip pain that radiates down her leg. Onset 03/2019 and it has been persistent  -She has been taking Gabapentin and continues to f/u with her orthopedic surgeon, Dr. Ninfa Linden. -She also does chair yoga and tries to elevated her right leg to help her pain. I will order MIR of right hip to rule out this being related to her breast cancer.  -We will obtain right hip MRI further evaluation and ruled out bone metastasis, also my suspicion is not very high.  8. CKD, Stage III -Secondary to her DM -Cr at 1.21 today (10/30/19). I  encouraged her to drink more water.    Plan -DEXA in 3 weeks  -MRI of right hip in 3 weeks  -Lab and F/u in 6 months    No problem-specific Assessment & Plan notes found for this encounter.   Orders Placed This Encounter  Procedures  . DG Bone Density    Standing Status:   Future      Standing Expiration Date:   10/29/2020    Scheduling Instructions:     [facility]    Order Specific Question:   Reason for Exam (SYMPTOM  OR DIAGNOSIS REQUIRED)    Answer:   screening    Order Specific Question:   Preferred imaging location?    Answer:   External  . MR HIP RIGHT W WO CONTRAST    Standing Status:   Future    Standing Expiration Date:   10/29/2020    Order Specific Question:   If indicated for the ordered procedure, I authorize the administration of contrast media per Radiology protocol    Answer:   Yes    Order Specific Question:   What is the patient's sedation requirement?    Answer:   No Sedation    Order Specific Question:   Does the patient have a pacemaker or implanted devices?    Answer:   No    Order Specific Question:   Radiology Contrast Protocol - do NOT remove file path    Answer:   [email or web address]    Order Specific Question:   Preferred imaging location?    Answer:   [facility] (table limit-550lbs)   All questions were answered. The patient knows to call the clinic with any problems, questions or concerns. No barriers to learning was detected. The total time spent in the appointment was 30 minutes.     Truitt Merle, MD 10/30/2019   I, Joslyn Devon, am acting as scribe for Truitt Merle, MD.   I have reviewed the above documentation for accuracy and completeness, and I agree with the above.

## 2019-10-24 ENCOUNTER — Other Ambulatory Visit: Payer: Self-pay | Admitting: Cardiology

## 2019-10-25 ENCOUNTER — Encounter: Payer: Self-pay | Admitting: Adult Health

## 2019-10-25 ENCOUNTER — Other Ambulatory Visit: Payer: Self-pay | Admitting: Radiology

## 2019-10-30 ENCOUNTER — Other Ambulatory Visit: Payer: Self-pay | Admitting: Cardiology

## 2019-10-30 ENCOUNTER — Inpatient Hospital Stay: Payer: Medicare PPO | Attending: Hematology | Admitting: Hematology

## 2019-10-30 ENCOUNTER — Other Ambulatory Visit: Payer: Self-pay

## 2019-10-30 ENCOUNTER — Telehealth: Payer: Self-pay | Admitting: Hematology

## 2019-10-30 ENCOUNTER — Inpatient Hospital Stay: Payer: Medicare PPO

## 2019-10-30 VITALS — BP 145/73 | HR 65 | Temp 97.5°F | Resp 17 | Ht 61.0 in | Wt 129.3 lb

## 2019-10-30 DIAGNOSIS — N183 Chronic kidney disease, stage 3 unspecified: Secondary | ICD-10-CM | POA: Diagnosis not present

## 2019-10-30 DIAGNOSIS — I4821 Permanent atrial fibrillation: Secondary | ICD-10-CM | POA: Diagnosis not present

## 2019-10-30 DIAGNOSIS — Z853 Personal history of malignant neoplasm of breast: Secondary | ICD-10-CM | POA: Insufficient documentation

## 2019-10-30 DIAGNOSIS — M545 Low back pain: Secondary | ICD-10-CM | POA: Insufficient documentation

## 2019-10-30 DIAGNOSIS — Z923 Personal history of irradiation: Secondary | ICD-10-CM | POA: Diagnosis not present

## 2019-10-30 DIAGNOSIS — Z7984 Long term (current) use of oral hypoglycemic drugs: Secondary | ICD-10-CM | POA: Insufficient documentation

## 2019-10-30 DIAGNOSIS — C50411 Malignant neoplasm of upper-outer quadrant of right female breast: Secondary | ICD-10-CM | POA: Insufficient documentation

## 2019-10-30 DIAGNOSIS — M8580 Other specified disorders of bone density and structure, unspecified site: Secondary | ICD-10-CM | POA: Diagnosis not present

## 2019-10-30 DIAGNOSIS — C50412 Malignant neoplasm of upper-outer quadrant of left female breast: Secondary | ICD-10-CM

## 2019-10-30 DIAGNOSIS — M25551 Pain in right hip: Secondary | ICD-10-CM | POA: Insufficient documentation

## 2019-10-30 DIAGNOSIS — E1122 Type 2 diabetes mellitus with diabetic chronic kidney disease: Secondary | ICD-10-CM | POA: Insufficient documentation

## 2019-10-30 DIAGNOSIS — Z170 Estrogen receptor positive status [ER+]: Secondary | ICD-10-CM

## 2019-10-30 DIAGNOSIS — Z171 Estrogen receptor negative status [ER-]: Secondary | ICD-10-CM

## 2019-10-30 LAB — CBC WITH DIFFERENTIAL (CANCER CENTER ONLY)
Abs Immature Granulocytes: 0.04 10*3/uL (ref 0.00–0.07)
Basophils Absolute: 0.1 10*3/uL (ref 0.0–0.1)
Basophils Relative: 1 %
Eosinophils Absolute: 0.1 10*3/uL (ref 0.0–0.5)
Eosinophils Relative: 2 %
HCT: 43.1 % (ref 36.0–46.0)
Hemoglobin: 14.1 g/dL (ref 12.0–15.0)
Immature Granulocytes: 1 %
Lymphocytes Relative: 19 %
Lymphs Abs: 1.4 10*3/uL (ref 0.7–4.0)
MCH: 31.4 pg (ref 26.0–34.0)
MCHC: 32.7 g/dL (ref 30.0–36.0)
MCV: 96 fL (ref 80.0–100.0)
Monocytes Absolute: 0.7 10*3/uL (ref 0.1–1.0)
Monocytes Relative: 9 %
Neutro Abs: 5.3 10*3/uL (ref 1.7–7.7)
Neutrophils Relative %: 68 %
Platelet Count: 156 10*3/uL (ref 150–400)
RBC: 4.49 MIL/uL (ref 3.87–5.11)
RDW: 13.6 % (ref 11.5–15.5)
WBC Count: 7.6 10*3/uL (ref 4.0–10.5)
nRBC: 0 % (ref 0.0–0.2)

## 2019-10-30 LAB — CMP (CANCER CENTER ONLY)
ALT: 22 U/L (ref 0–44)
AST: 21 U/L (ref 15–41)
Albumin: 3.8 g/dL (ref 3.5–5.0)
Alkaline Phosphatase: 90 U/L (ref 38–126)
Anion gap: 13 (ref 5–15)
BUN: 40 mg/dL — ABNORMAL HIGH (ref 8–23)
CO2: 20 mmol/L — ABNORMAL LOW (ref 22–32)
Calcium: 9.7 mg/dL (ref 8.9–10.3)
Chloride: 108 mmol/L (ref 98–111)
Creatinine: 1.21 mg/dL — ABNORMAL HIGH (ref 0.44–1.00)
GFR, Est AFR Am: 48 mL/min — ABNORMAL LOW (ref >60)
GFR, Estimated: 42 mL/min — ABNORMAL LOW (ref >60)
Glucose, Bld: 198 mg/dL — ABNORMAL HIGH (ref 70–99)
Potassium: 4.1 mmol/L (ref 3.5–5.1)
Sodium: 141 mmol/L (ref 135–145)
Total Bilirubin: 0.4 mg/dL (ref 0.3–1.2)
Total Protein: 7 g/dL (ref 6.5–8.1)

## 2019-10-30 NOTE — Telephone Encounter (Signed)
Scheduled per 6/28 los. Unable to reach pt. Left voicemail with appt times and date.

## 2019-10-31 ENCOUNTER — Encounter: Payer: Self-pay | Admitting: Adult Health

## 2019-10-31 ENCOUNTER — Encounter: Payer: Self-pay | Admitting: Hematology

## 2019-10-31 ENCOUNTER — Ambulatory Visit: Payer: Medicare PPO | Admitting: Adult Health

## 2019-10-31 VITALS — BP 130/74 | Temp 97.7°F | Wt 128.0 lb

## 2019-10-31 DIAGNOSIS — M5430 Sciatica, unspecified side: Secondary | ICD-10-CM | POA: Diagnosis not present

## 2019-10-31 DIAGNOSIS — E139 Other specified diabetes mellitus without complications: Secondary | ICD-10-CM | POA: Diagnosis not present

## 2019-10-31 LAB — POCT GLYCOSYLATED HEMOGLOBIN (HGB A1C): HbA1c, POC (controlled diabetic range): 7.3 % — AB (ref 0.0–7.0)

## 2019-10-31 MED ORDER — ACCU-CHEK AVIVA PLUS VI STRP: ORAL_STRIP | 3 refills | Status: DC

## 2019-10-31 MED ORDER — ACCU-CHEK SOFTCLIX LANCETS MISC: 12 refills | Status: DC

## 2019-10-31 MED ORDER — GABAPENTIN 100 MG PO CAPS: 100.0000 mg | ORAL_CAPSULE | Freq: Every day | ORAL | 1 refills | Status: DC

## 2019-10-31 NOTE — Progress Notes (Signed)
Subjective:    Patient ID: Adrienne [name], female    DOB: 1934-10-01, 85 y.o.   MRN: [id number]  HPI 85 year old female who  has a past medical history of Allergy, Atrial fibrillation ([place]), Breast cancer ([place]) (08/25/12), Diabetes mellitus, Family history of cancer, Gout, Hearing loss, History of breast cancer, History of frequent urinary tract infections, History of radiation therapy (03/21/18- 04/19/18), radiation therapy (11/22/12- 12/19/12), Hyperlipidemia, Hypertension, and Osteopenia.  She presents to the office today for 42-monthfollow-up regarding diabetes mellitus.  She is currently controlled on Metformin 500 mg twice daily.  She does monitor her blood sugars at home and reports readings between 120 and 150.  She denies any hypoglycemic events.  She was last seen in March 2021 for her CPE her A1c had increased from 6.7-7.3.  There were no changes in medication instead she was advised to start working on lifestyle modification. She reports that since we last saw each [place]  Lab Results  Component Value Date   HGBA1C 7.3 (A) 10/31/2019    She also needs a refill of Gabapentin for her sciatica pain. Has an upcoming MRI or left hip  Review of Systems See HPI   Past Medical History:  Diagnosis Date  . Allergy   . Atrial fibrillation ([place]   . [place]   Invasive ductal ca,DCIS  . Diabetes mellitus    type II  . Family history of cancer   . Gout   . Hearing loss   . History of breast cancer   . History of frequent urinary tract infections   . History of radiation therapy 03/21/18- 04/19/18   Right Breast 2.67 Gy X 15 fractions, right breast boost 2 Gy X 5 fractions.   .Marland KitchenHx of radiation therapy 11/22/12- 12/19/12   breast 4250 cGy 17 sessions, left breast boost 750 cGy 3 sessions  . Hyperlipidemia   . Hypertension   . Osteopenia     Social History   Socioeconomic History  . Marital status:  Married    Spouse name: Not on file  . Number of children: 3  . Years of education: Not on file  . Highest education level: Not on file  Occupational History  . Not on file  Tobacco Use  . Smoking status: Former Smoker    Packs/day: 0.25    Years: 4.00    Pack years: 1.00    Types: Cigarettes  . Smokeless tobacco: Never Used  . Tobacco comment: Cigarette use was 50 years ago  Vaping Use  . Vaping Use: Never used  Substance and Sexual Activity  . Alcohol use: Not Currently  . Drug use: No  . Sexual activity: Never    Comment: menarche age 85 141stbirth age 85 GG38 HRT x 7 years?  Other Topics Concern  . Not on file  Social History Narrative   Married - husband in memory care unit    Three children ( [place]     She likes to read and go to [employer]   Social Determinants of Health   Financial Resource Strain:   . Difficulty of Paying Living Expenses:   Food Insecurity:   . Worried About RCharity fundraiserin the Last Year:   . RArboriculturistin the Last Year:   Transportation Needs:   . Lack  of Transportation (Medical):   Marland Kitchen Lack of Transportation (Non-Medical):   Physical Activity:   . Days of Exercise per Week:   . Minutes of Exercise per Session:   Stress:   . Feeling of Stress :   Social Connections:   . Frequency of Communication with Friends and Family:   . Frequency of Social Gatherings with Friends and Family:   . Attends Religious Services:   . Active Member of Clubs or Organizations:   . Attends Archivist Meetings:   Marland Kitchen Marital Status:   Intimate Partner Violence:   . Fear of Current or Ex-Partner:   . Emotionally Abused:   Marland Kitchen Physically Abused:   . Sexually Abused:     Past Surgical History:  Procedure Laterality Date  . ABDOMINAL HYSTERECTOMY Bilateral 1999   w/b/l salpingo-oopherectomy  . APPENDECTOMY    . BREAST LUMPECTOMY WITH NEEDLE LOCALIZATION AND AXILLARY SENTINEL LYMPH NODE BX Left  09/12/2012   Procedure: LEFT NEEDLE LOCALIZATION BREAST LUMPECTOMY TION AND AXILLARY SENTINEL LYMPH NODE BX;  Surgeon: Edward Jolly, MD;  Location: [facility];  Service: General;  Laterality: Left;  . BREAST LUMPECTOMY WITH RADIOACTIVE SEED AND SENTINEL LYMPH NODE BIOPSY Right 10/26/2017   Procedure: BREAST LUMPECTOMY WITH RADIOACTIVE SEED AND SENTINEL LYMPH NODE BIOPSY;  Surgeon: Excell Seltzer, MD;  Location: [facility];  Service: General;  Laterality: Right;  . BREAST SURGERY  1990's   right- fibroid cyst  . CHOLECYSTECTOMY    . SEPTOPLASTY    . TONSILLECTOMY      Family History  Problem Relation Age of Onset  . Hypertension Mother   . Heart disease Mother        Murmur and irregular heart disease  . Dementia Mother        d. 2  . Hypertension Father   . Aneurysm Father 17  . Breast cancer Cousin 88       mat first cousin    Allergies  Allergen Reactions  . Penicillins Hives    Has patient had a PCN reaction causing immediate rash, facial/tongue/throat swelling, SOB or lightheadedness with hypotension: No Has patient had a PCN reaction causing severe rash involving mucus membranes or skin necrosis: No Has patient had a PCN reaction that required hospitalization: No Has patient had a PCN reaction occurring within the last 10 years: No If all of the above answers are "NO", then may proceed with Cephalosporin use.  . Other Hives  . Prednisone Other (See Comments)    ELEVATED BLOOD SUGAR  . Radiaplexrx [Woun'Dres Hydrogel Wound Dress] Hives  . Sulfamethoxazole Nausea Only    Current Outpatient Medications on File Prior to Visit  Medication Sig Dispense Refill  . allopurinol (ZYLOPRIM) 300 MG tablet Take 1 tablet (300 mg total) by mouth daily. 90 tablet 3  . atenolol (TENORMIN) 25 MG tablet TAKE 1 TABLET (25 MG TOTAL) BY MOUTH DAILY. NEED OV. 15 tablet 0  . Blood Glucose Monitoring Suppl (BLOOD GLUCOSE MONITOR SYSTEM) w/Device KIT Accu Check Aviva Meter 1  kit 0  . chlorpheniramine (CHLOR-TRIMETON) 4 MG tablet Take 4 mg 2 (two) times daily as needed by mouth for allergies.    Marland Kitchen diltiazem (CARDIZEM CD) 360 MG 24 hr capsule Take 1 capsule (360 mg total) by mouth daily. NEED OV. 30 capsule 2  . ELIQUIS 2.5 MG TABS tablet TAKE 1 TABLET BY MOUTH TWICE A DAY 180 tablet 1  . fish oil-omega-3 fatty acids 1000 MG capsule Take 1 g  by mouth daily.    Marland Kitchen gabapentin (NEURONTIN) 100 MG capsule Take 1 capsule (100 mg total) by mouth at bedtime. 90 capsule 1  . glucose blood (ACCU-CHEK AVIVA PLUS) test strip USE TO TEST TWICE DAILY **ICD-10 CODE E13.9** 200 each 3  . metFORMIN (GLUCOPHAGE) 500 MG tablet TAKE 1 TABLET BY MOUTH TWICE A DAY 180 tablet 0  . Multiple Vitamin (MULTIVITAMIN) tablet Take 1 tablet by mouth daily.    Marland Kitchen OVER THE COUNTER MEDICATION Take by mouth 2 (two) times daily. Presavision- for macular degeneration prevention    . rosuvastatin (CRESTOR) 5 MG tablet Take 1 tablet (5 mg total) by mouth daily. 90 tablet 1  . TOBREX 0.3 % ophthalmic ointment APPLY A SMALL AMOUNT ON EYELID THREE TIMES A DAY AS NEEDED    . VITAMIN D, ERGOCALCIFEROL, PO Take by mouth.     No current facility-administered medications on file prior to visit.    BP 130/74   Temp 97.7 F (36.5 C)   Wt 128 lb (58.1 kg)   BMI 24.19 kg/m       Objective:   Physical Exam Vitals and nursing note reviewed.  Constitutional:      Appearance: Normal appearance.  Cardiovascular:     Rate and Rhythm: Normal rate and regular rhythm.     Pulses: Normal pulses.     Heart sounds: Normal heart sounds.  Skin:    General: Skin is warm and dry.     Capillary Refill: Capillary refill takes less than 2 seconds.  Neurological:     General: No focal deficit present.     Mental Status: She is alert and oriented to person, place, and time.  Psychiatric:        Mood and Affect: Mood normal.        Behavior: Behavior normal.        Thought Content: Thought content normal.         Judgment: Judgment normal.       Assessment & Plan:  1. Diabetes 1.5, managed as type 2 ([place])  - POCT A1C - 7.3 - no change  - work on diet - no change in diet  - Follow up in 3 months or sooner if needed - glucose blood (ACCU-CHEK AVIVA PLUS) test strip; USE TO TEST TWICE DAILY **ICD-10 CODE E13.9**  Dispense: 200 each; Refill: 3 - Accu-Chek Softclix Lancets lancets; Use as instructed  Dispense: 200 each; Refill: 12  2. Sciatica, unspecified laterality  - gabapentin (NEURONTIN) 100 MG capsule; Take 1 capsule (100 mg total) by mouth at bedtime.  Dispense: 90 capsule; Refill: 1   [name], NP

## 2019-11-14 ENCOUNTER — Other Ambulatory Visit: Payer: Self-pay | Admitting: Cardiology

## 2019-12-06 ENCOUNTER — Other Ambulatory Visit: Payer: Self-pay | Admitting: Cardiology

## 2019-12-18 ENCOUNTER — Ambulatory Visit
Admission: RE | Admit: 2019-12-18 | Discharge: 2019-12-18 | Disposition: A | Discharge status: Home / Self Care | Payer: Medicare PPO | Source: Ambulatory Visit | Attending: Hematology | Admitting: Hematology

## 2019-12-18 ENCOUNTER — Other Ambulatory Visit: Payer: Self-pay | Admitting: Adult Health

## 2019-12-18 DIAGNOSIS — C50411 Malignant neoplasm of upper-outer quadrant of right female breast: Secondary | ICD-10-CM

## 2019-12-18 DIAGNOSIS — E139 Other specified diabetes mellitus without complications: Secondary | ICD-10-CM

## 2019-12-18 MED ORDER — GADOBENATE DIMEGLUMINE 529 MG/ML IV SOLN
12.0000 mL | Freq: Once | INTRAVENOUS | Status: AC | PRN
  Administered 2019-12-18: 12 mL via INTRAVENOUS

## 2019-12-19 ENCOUNTER — Telehealth: Payer: Self-pay

## 2019-12-19 NOTE — Telephone Encounter (Signed)
I spoke with Ms Smalling and relayed Dr. Ernestina Penna comments. She requested a copy be faxed to her PCP [name], NP.   A copy of the MRI was efaxed to Dr. Ninfa Linden and [name] via epic.  Ms Rabold verbalized understanding.

## 2019-12-19 NOTE — Telephone Encounter (Signed)
-----   Message from Truitt Merle, MD sent at 12/19/2019 11:25 AM EDT ----- Please let pt know her MRI results, no evidence of bone mets, she does have arthritis and peritendinitis and bursitis, please fax the report to her orthopedic surgeon Dr. Ninfa Linden. I think her T1 signal change in left pubic bone is likely begin, if she does not have pain in that area, will f/u clinically. Thanks   Truitt Merle  12/19/2019

## 2019-12-20 MED ORDER — METFORMIN HCL 500 MG PO TABS: 500.0000 mg | ORAL_TABLET | Freq: Two times a day (BID) | ORAL | 0 refills | Status: DC

## 2019-12-21 ENCOUNTER — Other Ambulatory Visit: Payer: Self-pay | Admitting: Cardiology

## 2019-12-21 ENCOUNTER — Other Ambulatory Visit: Payer: Self-pay | Admitting: Adult Health

## 2019-12-28 ENCOUNTER — Telehealth: Payer: Self-pay | Admitting: Hematology

## 2019-12-28 NOTE — Telephone Encounter (Signed)
R/s appts on 12/29 to see LB. YF is in [facility]. Unable to reach pt. Called 2x and pt hung up. Mailing pt appt calendar.

## 2020-01-03 ENCOUNTER — Other Ambulatory Visit: Payer: Self-pay

## 2020-01-03 ENCOUNTER — Ambulatory Visit: Payer: Medicare PPO | Admitting: Orthopaedic Surgery

## 2020-01-03 ENCOUNTER — Encounter: Payer: Self-pay | Admitting: Orthopaedic Surgery

## 2020-01-03 DIAGNOSIS — M25551 Pain in right hip: Secondary | ICD-10-CM

## 2020-01-03 DIAGNOSIS — M5431 Sciatica, right side: Secondary | ICD-10-CM

## 2020-01-03 DIAGNOSIS — M79604 Pain in right leg: Secondary | ICD-10-CM | POA: Diagnosis not present

## 2020-01-03 NOTE — Progress Notes (Signed)
The patient comes in for follow-up after having an MRI of her right hip.  I was want to rule out a fracture based on her labs and the pain she is having.  She still reports groin pain is walking with a slight limp.  She is an active 85 year old female.  On examination right hip she has pain with internal and external rotation of the hip.  The MRI did not show any evidence of fracture but does show moderate arthritis around both hips.  There is also some tendinitis around the muscle and tendon structures around the hip.  They also are showing some atrophy in the paraspinous muscles to the right side.  She was wondering if she had sciatica but on exam today I do not sense that her sciatica is much as is the arthritis in her hip.  I told her that I would like to set her up for an ultrasound-guided injection of a steroid in her right hip by Dr. Ernestina Patches.  I explained my rationale behind this as well as the risk and benefits.  She agrees with trying this conservative treatment route.  We will work on setting her up for an appoint with Dr. Ernestina Patches.  When she does have the injection they can schedule her for a follow-up visit with me about 2 weeks after that injection.

## 2020-01-09 ENCOUNTER — Telehealth: Payer: Self-pay

## 2020-01-09 NOTE — Telephone Encounter (Signed)
Left voice message for patient regarding bone density scan results.  Per Dr. Burr Medico informed her osteopenia is stable. Continue Vitamin D, no there changes.

## 2020-01-13 ENCOUNTER — Other Ambulatory Visit: Payer: Self-pay | Admitting: Cardiology

## 2020-01-16 ENCOUNTER — Ambulatory Visit: Payer: Medicare PPO | Admitting: Family Medicine

## 2020-01-16 ENCOUNTER — Telehealth: Payer: Self-pay

## 2020-01-16 ENCOUNTER — Other Ambulatory Visit: Payer: Self-pay

## 2020-01-16 ENCOUNTER — Encounter: Payer: Self-pay | Admitting: Family Medicine

## 2020-01-16 VITALS — BP 152/90 | HR 82 | Temp 97.9°F | Ht 61.5 in | Wt 128.0 lb

## 2020-01-16 DIAGNOSIS — N390 Urinary tract infection, site not specified: Secondary | ICD-10-CM

## 2020-01-16 MED ORDER — NITROFURANTOIN MONOHYD MACRO 100 MG PO CAPS: 100.0000 mg | ORAL_CAPSULE | Freq: Two times a day (BID) | ORAL | 0 refills | Status: DC

## 2020-01-16 NOTE — Progress Notes (Signed)
   Subjective:    Patient ID: Adrienne [name], female    DOB: 1934/06/09, 85 y.o.   MRN: [id number]  HPI Here for 4 days of urinary pressure and frequency. No fever or nausea. She is drinking plenty of water. She was treated for another UTI in July with Keflex. No culture was done. She seemed to get over it for awhile. She is unable to produce a urine specimen today.    Review of Systems  Constitutional: Negative.   Respiratory: Negative.   Cardiovascular: Negative.   Gastrointestinal: Negative.   Genitourinary: Positive for frequency and urgency. Negative for dysuria, hematuria and pelvic pain.       Objective:   Physical Exam Constitutional:      Appearance: Normal appearance. She is not ill-appearing.  Cardiovascular:     Rate and Rhythm: Normal rate and regular rhythm.     Pulses: Normal pulses.     Heart sounds: Normal heart sounds.  Pulmonary:     Effort: Pulmonary effort is normal.     Breath sounds: Normal breath sounds.  Abdominal:     General: Abdomen is flat. Bowel sounds are normal. There is no distension.     Palpations: Abdomen is soft. There is no mass.     Tenderness: There is no abdominal tenderness. There is no right CVA tenderness, left CVA tenderness, guarding or rebound.     Hernia: No hernia is present.  Neurological:     Mental Status: She is alert.           Assessment & Plan:  UTI, treat with Macrobid. Recheck as needed.  Alysia Penna, MD

## 2020-01-16 NOTE — Telephone Encounter (Signed)
error 

## 2020-01-17 ENCOUNTER — Telehealth: Payer: Self-pay | Admitting: Physical Medicine and Rehabilitation

## 2020-01-17 NOTE — Telephone Encounter (Signed)
Patient called advised she will be coming to her appointment. Patient said she hit cancel by accident. The number to contact patient if needed is [phone]

## 2020-01-19 ENCOUNTER — Encounter: Payer: Self-pay | Admitting: Hematology

## 2020-01-22 ENCOUNTER — Ambulatory Visit: Payer: Self-pay

## 2020-01-22 ENCOUNTER — Encounter: Payer: Self-pay | Admitting: Physical Medicine and Rehabilitation

## 2020-01-22 ENCOUNTER — Other Ambulatory Visit: Payer: Self-pay

## 2020-01-22 ENCOUNTER — Ambulatory Visit: Payer: Medicare PPO | Admitting: Physical Medicine and Rehabilitation

## 2020-01-22 DIAGNOSIS — M25551 Pain in right hip: Secondary | ICD-10-CM | POA: Diagnosis not present

## 2020-01-22 NOTE — Progress Notes (Signed)
   Adrienne [name] - 85 y.o. female MRN [id number]  Date of birth: July 17, 1934  Office Visit Note: Visit Date: 01/22/2020 PCP: [name], NP Referred by: [name], NP  Subjective: No chief complaint on file.  HPI:  Adrienne [name] is a 85 y.o. female who comes in today at the request of Dr. Jean Rosenthal for planned Right anesthetic hip arthrogram with fluoroscopic guidance.  The patient has failed conservative care including home exercise, medications, time and activity modification.  This injection will be diagnostic and hopefully therapeutic.  Please see requesting physician notes for further details and justification.   ROS Otherwise per HPI.  Assessment & Plan: Visit Diagnoses:  1. Pain in right hip     Plan: No additional findings.   Meds & Orders: No orders of the defined types were placed in this encounter.   Orders Placed This Encounter  Procedures  . Large Joint Inj  . XR C-ARM NO REPORT    Follow-up: Return in about 2 weeks (around 02/05/2020) for Jean Rosenthal, MD.   Procedures: Large Joint Inj: R hip joint on 01/22/2020 9:00 AM Indications: diagnostic evaluation and pain Details: 22 G 3.5 in needle, fluoroscopy-guided anterior approach  Arthrogram: No  Medications: 4 mL bupivacaine 0.25 %; 60 mg triamcinolone acetonide 40 MG/ML Outcome: tolerated well, no immediate complications  There was excellent flow of contrast producing a partial arthrogram of the hip. The patient did have relief of symptoms during the anesthetic phase of the injection. Procedure, treatment alternatives, risks and benefits explained, specific risks discussed. Consent was given by the patient. Immediately prior to procedure a time out was called to verify the correct patient, procedure, equipment, support staff and site/side marked as required. Patient was prepped and draped in the usual sterile fashion.      No notes on file   Clinical History: MRI OF THE RIGHT  HIP WITHOUT AND WITH CONTRAST  TECHNIQUE: Multiplanar, multisequence MR imaging was performed both before and after administration of intravenous contrast.  CONTRAST:  78mL MULTIHANCE GADOBENATE DIMEGLUMINE 529 MG/ML IV SOLN  COMPARISON:  Radiographs 03/27/2019  FINDINGS: Bones: Both hips are normally located. No hip fracture or AVN. Moderate degenerative changes bilaterally. No joint effusion or periarticular fluid collections to suggest a paralabral cyst. Moderate bilateral peritendinitis and mild trochanteric bursitis.  Degenerative changes noted the pubic symphysis. Ill-defined low T1 signal intensity in the left pubic bone could be degenerative or sclerotic change related to treated metastatic focus. This is not covered on the postcontrast images. No pubic rami fractures. The bony pelvis is otherwise intact. No bone lesions or fractures.  Age related fatty atrophy involving the paraspinal and parapelvic musculature but no muscle lesions or muscle tear. The hamstring tendons are intact.  No significant intrapelvic abnormalities are identified.  IMPRESSION: 1. Moderate bilateral hip joint degenerative changes but no hip fracture or AVN. 2. Moderate bilateral peritendinitis and mild trochanteric bursitis. 3. No significant intrapelvic abnormalities. 4. Ill-defined low T1 signal intensity in the left pubic bone could be degenerative or sclerotic change related to treated metastatic focus. Bony pelvic CT scan may be helpful for further evaluation.   Electronically Signed   By: Marijo Sanes M.D.   On: 12/19/2019 09:02     Objective:  VS:  HT:    WT:   BMI:     BP:   HR: bpm  TEMP: ( )  RESP:  Physical Exam   Imaging: No results found.

## 2020-01-22 NOTE — Progress Notes (Signed)
Pt state right hip pain. Pt state walking and turn over at night. Pt state getting up from a chair makes the pain worse. Pt state pain meds helps a little with the pain. Pt state more she walks the better it feels.  Numeric Pain Rating Scale and Functional Assessment Average Pain 6   In the last MONTH (on 0-10 scale) has pain interfered with the following?  1. General activity like being  able to carry out your everyday physical activities such as walking, climbing stairs, carrying groceries, or moving a chair?  Rating(6)   +Driver, +BT, -Dye Allergies.

## 2020-01-28 MED ORDER — TRIAMCINOLONE ACETONIDE 40 MG/ML IJ SUSP
60.0000 mg | INTRAMUSCULAR | Status: AC | PRN
  Administered 2020-01-22: 60 mg via INTRA_ARTICULAR

## 2020-01-28 MED ORDER — BUPIVACAINE HCL 0.25 % IJ SOLN
4.0000 mL | INTRAMUSCULAR | Status: AC | PRN
  Administered 2020-01-22: 4 mL via INTRA_ARTICULAR

## 2020-01-29 ENCOUNTER — Ambulatory Visit: Payer: Medicare PPO | Admitting: Cardiology

## 2020-01-29 ENCOUNTER — Encounter: Payer: Self-pay | Admitting: Cardiology

## 2020-01-29 ENCOUNTER — Other Ambulatory Visit: Payer: Self-pay

## 2020-01-29 ENCOUNTER — Telehealth: Payer: Self-pay | Admitting: Cardiology

## 2020-01-29 VITALS — BP 128/62 | HR 76 | Ht 61.0 in | Wt 125.0 lb

## 2020-01-29 DIAGNOSIS — I4821 Permanent atrial fibrillation: Secondary | ICD-10-CM

## 2020-01-29 DIAGNOSIS — E7800 Pure hypercholesterolemia, unspecified: Secondary | ICD-10-CM

## 2020-01-29 DIAGNOSIS — I10 Essential (primary) hypertension: Secondary | ICD-10-CM | POA: Diagnosis not present

## 2020-01-29 MED ORDER — ATENOLOL 25 MG PO TABS: ORAL_TABLET | ORAL | 3 refills | Status: DC

## 2020-01-29 NOTE — Telephone Encounter (Signed)
Patient said that she needs to see the doctor due to f/u visit and to her medicines refilled by doctor. Scheduled patient for this afternoon on 01/29/20 at 1:20 pm

## 2020-01-29 NOTE — Patient Instructions (Signed)
  Testing/Procedures:  Your physician has requested that you have an echocardiogram. Echocardiography is a painless test that uses sound waves to create images of your heart. It provides your doctor with information about the size and shape of your heart and how well your heart's chambers and valves are working. This procedure takes approximately one hour. There are no restrictions for this procedure.[place]     Follow-Up: At [facility], you and your health needs are our priority.  As part of our continuing mission to provide you with exceptional heart care, we have created designated Provider Care Teams.  These Care Teams include your primary Cardiologist (physician) and Advanced Practice Providers (APPs -  Physician Assistants and Nurse Practitioners) who all work together to provide you with the care you need, when you need it.  We recommend signing up for the patient portal called "MyChart".  Sign up information is provided on this After Visit Summary.  MyChart is used to connect with patients for Virtual Visits (Telemedicine).  Patients are able to view lab/test results, encounter notes, upcoming appointments, etc.  Non-urgent messages can be sent to your provider as well.   To learn more about what you can do with MyChart, go to [email or web address].    Your next appointment:   12 month(s)  The format for your next appointment:   In Person  Provider:   You may see Kirk Ruths, MD or one of the following Advanced Practice Providers on your designated Care Team:    Kerin Ransom, PA-C  [place], [place]  Coletta Memos, [place]

## 2020-01-29 NOTE — Progress Notes (Signed)
HPI: FU atrial fibrillation. Echo 12/16 showed EF 50, mild biatrial enlargement, mild MR. Holter repeated September 2017 and showed rate was controlled. Since last seen,she denies dyspnea, chest pain, palpitations or syncope.  No bleeding.  Current Outpatient Medications  Medication Sig Dispense Refill   Accu-Chek Softclix Lancets lancets Use as instructed 200 each 12   allopurinol (ZYLOPRIM) 300 MG tablet Take 1 tablet (300 mg total) by mouth daily. 90 tablet 3   atenolol (TENORMIN) 25 MG tablet TAKE 1 TABLET BY MOUTH EVERY DAY **NEED OFFICE VISIT** 30 tablet 1   Blood Glucose Monitoring Suppl (BLOOD GLUCOSE MONITOR SYSTEM) w/Device KIT Accu Check Aviva Meter 1 kit 0   chlorpheniramine (CHLOR-TRIMETON) 4 MG tablet Take 4 mg 2 (two) times daily as needed by mouth for allergies.     diltiazem (CARDIZEM CD) 360 MG 24 hr capsule TAKE 1 CAPSULE BY MOUTH EVERY DAY 30 capsule 0   ELIQUIS 2.5 MG TABS tablet TAKE 1 TABLET BY MOUTH TWICE A DAY 180 tablet 1   fish oil-omega-3 fatty acids 1000 MG capsule Take 1 g by mouth daily.     gabapentin (NEURONTIN) 100 MG capsule Take 1 capsule (100 mg total) by mouth at bedtime. 90 capsule 1   glucose blood (ACCU-CHEK AVIVA PLUS) test strip USE TO TEST TWICE DAILY **ICD-10 CODE E13.9** 200 each 3   metFORMIN (GLUCOPHAGE) 500 MG tablet Take 1 tablet (500 mg total) by mouth 2 (two) times daily. 180 tablet 0   Multiple Vitamin (MULTIVITAMIN) tablet Take 1 tablet by mouth daily.     OVER THE COUNTER MEDICATION Take by mouth 2 (two) times daily. Presavision- for macular degeneration prevention     phenazopyridine (PYRIDIUM) 95 MG tablet Take 95 mg by mouth 3 (three) times daily as needed for pain.     rosuvastatin (CRESTOR) 5 MG tablet TAKE 1 TABLET BY MOUTH EVERY DAY 90 tablet 1   VITAMIN D, ERGOCALCIFEROL, PO Take by mouth.     TOBREX 0.3 % ophthalmic ointment APPLY A SMALL AMOUNT ON EYELID THREE TIMES A DAY AS NEEDED (Patient not taking:  Reported on 01/29/2020)     No current facility-administered medications for this visit.     Past Medical History:  Diagnosis Date   Allergy    Atrial fibrillation ([place])    Breast cancer ([place]) 08/25/12   Invasive ductal ca,DCIS   Diabetes mellitus    type II   Family history of cancer    Gout    Hearing loss    History of breast cancer    History of frequent urinary tract infections    History of radiation therapy 03/21/18- 04/19/18   Right Breast 2.67 Gy X 15 fractions, right breast boost 2 Gy X 5 fractions.    Hx of radiation therapy 11/22/12- 12/19/12   breast 4250 cGy 17 sessions, left breast boost 750 cGy 3 sessions   Hyperlipidemia    Hypertension    Osteopenia     Past Surgical History:  Procedure Laterality Date   ABDOMINAL HYSTERECTOMY Bilateral 1999   w/b/l salpingo-oopherectomy   APPENDECTOMY     BREAST LUMPECTOMY WITH NEEDLE LOCALIZATION AND AXILLARY SENTINEL LYMPH NODE BX Left 09/12/2012   Procedure: LEFT NEEDLE LOCALIZATION BREAST LUMPECTOMY TION AND AXILLARY SENTINEL LYMPH NODE BX;  Surgeon: Edward Jolly, MD;  Location: [facility];  Service: General;  Laterality: Left;   BREAST LUMPECTOMY WITH RADIOACTIVE SEED AND SENTINEL LYMPH NODE BIOPSY Right 10/26/2017   Procedure: BREAST  LUMPECTOMY WITH RADIOACTIVE SEED AND SENTINEL LYMPH NODE BIOPSY;  Surgeon: Excell Seltzer, MD;  Location: [facility];  Service: General;  Laterality: Right;   BREAST SURGERY  1990's   right- fibroid cyst   CHOLECYSTECTOMY     SEPTOPLASTY     TONSILLECTOMY      Social History   Socioeconomic History   Marital status: Married    Spouse name: Not on file   Number of children: 3   Years of education: Not on file   Highest education level: Not on file  Occupational History   Not on file  Tobacco Use   Smoking status: Former Smoker    Packs/day: 0.25    Years: 4.00    Pack years: 1.00    Types: Cigarettes   Smokeless tobacco: Never  Used   Tobacco comment: Cigarette use was 50 years ago  Vaping Use   Vaping Use: Never used  Substance and Sexual Activity   Alcohol use: Not Currently   Drug use: No   Sexual activity: Never    Comment: menarche age 27, 89st birth age 65, G69, HRT x 7 years?  Other Topics Concern   Not on file  Social History Narrative   Married - husband in memory care unit    Three children ( One lives at [facility] and two in [place]      She likes to read and go to [employer]    Social Determinants of Health   Financial Resource Strain:    Difficulty of Paying Living Expenses: Not on file  Food Insecurity:    Worried About Charity fundraiser in the Last Year: Not on file   [employer] of Food in the Last Year: Not on file  Transportation Needs:    Lack of Transportation (Medical): Not on file   Lack of Transportation (Non-Medical): Not on file  Physical Activity:    Days of Exercise per Week: Not on file   Minutes of Exercise per Session: Not on file  Stress:    Feeling of Stress : Not on file  Social Connections:    Frequency of Communication with Friends and Family: Not on file   Frequency of Social Gatherings with Friends and Family: Not on file   Attends Religious Services: Not on file   Active Member of Clubs or Organizations: Not on file   Attends Archivist Meetings: Not on file   Marital Status: Not on file  Intimate Partner Violence:    Fear of Current or Ex-Partner: Not on file   Emotionally Abused: Not on file   Physically Abused: Not on file   Sexually Abused: Not on file    Family History  Problem Relation Age of Onset   Hypertension Mother    Heart disease Mother        Murmur and irregular heart disease   Dementia Mother        d. 53   Hypertension Father    Aneurysm Father 38   Breast cancer Cousin 91       mat first cousin    ROS: Hip pain but no fevers or chills, productive cough, hemoptysis, dysphasia, odynophagia,  melena, hematochezia, dysuria, hematuria, rash, seizure activity, orthopnea, PND, pedal edema, claudication. Remaining systems are negative.  Physical Exam: Well-developed well-nourished in no acute distress.  Skin is warm and dry.  HEENT is normal.  Neck is supple.  Chest is clear to auscultation with normal expansion.  Cardiovascular exam is  irregular Abdominal exam nontender or distended. No masses palpated. Extremities show no edema. neuro grossly intact  ECG-atrial fibrillation at a rate of 76, left axis deviation, left ventricular hypertrophy, nonspecific ST changes.  Personally reviewed  A/P  1 permanent atrial fibrillation-patient doing well from a symptomatic standpoint.  Continue Cardizem and atenolol for rate control.  Continue apixaban.  Repeat echocardiogram.  2 hypertension-blood pressure controlled.  Continue present medical regimen.  3 hyperlipidemia-continue statin. Per primary care.  Kirk Ruths, MD

## 2020-01-31 ENCOUNTER — Other Ambulatory Visit: Payer: Self-pay | Admitting: Cardiology

## 2020-02-05 ENCOUNTER — Encounter: Payer: Self-pay | Admitting: Orthopaedic Surgery

## 2020-02-05 ENCOUNTER — Ambulatory Visit (INDEPENDENT_AMBULATORY_CARE_PROVIDER_SITE_OTHER): Payer: Medicare PPO | Admitting: Orthopaedic Surgery

## 2020-02-05 DIAGNOSIS — M25551 Pain in right hip: Secondary | ICD-10-CM

## 2020-02-05 DIAGNOSIS — M25552 Pain in left hip: Secondary | ICD-10-CM | POA: Diagnosis not present

## 2020-02-05 MED ORDER — METHYLPREDNISOLONE ACETATE 40 MG/ML IJ SUSP
40.0000 mg | INTRAMUSCULAR | Status: AC | PRN
  Administered 2020-02-05: 40 mg via INTRA_ARTICULAR

## 2020-02-05 MED ORDER — LIDOCAINE HCL 1 % IJ SOLN
3.0000 mL | INTRAMUSCULAR | Status: AC | PRN
  Administered 2020-02-05: 3 mL

## 2020-02-05 NOTE — Progress Notes (Signed)
Office Visit Note   Patient: Adrienne [name]           Date of Birth: 1935-03-01           MRN: [id number] Visit Date: 02/05/2020              Requested by: [name], NP [address],  [place] [address] PCP: [name], NP   Assessment & Plan: Visit Diagnoses:  1. Pain in right hip   2. Pain in left hip     Plan: I did feel that it was appropriate based on her clinical exam to provide a steroid injection of her left hip trochanteric area.  She stays in friend's home [facility] and states that they do have physical therapy.  She is interested in physical therapy so I did give her a prescription to try physical therapy for her hips.  All questions and concerns were answered and addressed.  At this point follow-up can really be as needed unless things worsen and she knows to get a hold of [name].  Follow-Up Instructions: Return if symptoms worsen or fail to improve.   Orders:  Orders Placed This Encounter  Procedures  . Large Joint Inj   No orders of the defined types were placed in this encounter.     Procedures: Large Joint Inj: L greater trochanter on 02/05/2020 8:55 AM Indications: pain and diagnostic evaluation Details: 22 G 1.5 in needle, lateral approach  Arthrogram: No  Medications: 3 mL lidocaine 1 %; 40 mg methylPREDNISolone acetate 40 MG/ML Outcome: tolerated well, no immediate complications Procedure, treatment alternatives, risks and benefits explained, specific risks discussed. Consent was given by the patient. Immediately prior to procedure a time out was called to verify the correct patient, procedure, equipment, support staff and site/side marked as required. Patient was prepped and draped in the usual sterile fashion.       Clinical Data: No additional findings.   Subjective: Chief Complaint  Patient presents with  . Right Hip - Follow-up  The patient is a 85 year old female that I sent to Dr. Ernestina Patches recently for an  intra-articular injection in her right hip joint.  She states that that did not really help her at all.  I was concerned about the possibility of some intra-articular pathology versus even a nondisplaced hip fracture seeing how she walks with a limp.  Her pain was mainly over the trochanteric area and I did inject that area as well.  Now she reports that right side is feeling little better but her left hip is hurting.  She denies any trauma or any injuries.  HPI  Review of Systems She currently denies any headache, chest pain, shortness of breath, fever, chills, nausea, vomiting  Objective: Vital Signs: There were no vitals taken for this visit.  Physical Exam She is alert and orient x3 and in no acute distress Ortho Exam Examination of both hips show the move smoothly and fluidly with no pain in the groin at all.  She is able to weight-bear.  She has some pain to palpation over the trochanteric area on both the right and left hips but the left hip is a little bit more painful. Specialty Comments:  No specialty comments available.  Imaging: No results found.   PMFS History: Patient Active Problem List   Diagnosis Date Noted  . Port-A-Cath in place 01/28/2018  . Genetic testing 10/18/2017  . Family history of cancer   . History of breast cancer   .  Breast cancer of upper-outer quadrant of right female breast ([place]) 10/08/2017  . Osteopenia 09/21/2015  . Rapid heart rate 04/01/2015  . Atrial fibrillation ([place]) 04/01/2015  . Diabetes 1.5, managed as type 2 ([place]) 09/04/2014  . Breast cancer, left breast ([place]) 08/30/2012  . Influenza 08/04/2011  . Gout 06/01/2011  . Vaginitis, atrophic 06/01/2011  . RENAL INSUFFICIENCY, ACUTE 05/28/2010  . HEMATURIA, HX OF 05/28/2010  . Disorder of bone and cartilage 12/08/2007  . Hyperlipidemia 08/10/2007  . Essential hypertension 08/10/2007   Past Medical History:  Diagnosis Date  . Allergy   . Atrial fibrillation ([place])   . Breast cancer  ([place]) 08/25/12   Invasive ductal ca,DCIS  . Diabetes mellitus    type II  . Family history of cancer   . Gout   . Hearing loss   . History of breast cancer   . History of frequent urinary tract infections   . History of radiation therapy 03/21/18- 04/19/18   Right Breast 2.67 Gy X 15 fractions, right breast boost 2 Gy X 5 fractions.   [facility] Kitchen Hx of radiation therapy 11/22/12- 12/19/12   breast 4250 cGy 17 sessions, left breast boost 750 cGy 3 sessions  . Hyperlipidemia   . Hypertension   . Osteopenia     Family History  Problem Relation Age of Onset  . Hypertension Mother   . Heart disease Mother        Murmur and irregular heart disease  . Dementia Mother        d. 32  . Hypertension Father   . Aneurysm Father 34  . Breast cancer Cousin 74       mat first cousin    Past Surgical History:  Procedure Laterality Date  . ABDOMINAL HYSTERECTOMY Bilateral 1999   w/b/l salpingo-oopherectomy  . APPENDECTOMY    . BREAST LUMPECTOMY WITH NEEDLE LOCALIZATION AND AXILLARY SENTINEL LYMPH NODE BX Left 09/12/2012   Procedure: LEFT NEEDLE LOCALIZATION BREAST LUMPECTOMY TION AND AXILLARY SENTINEL LYMPH NODE BX;  Surgeon: Edward Jolly, MD;  Location: [facility];  Service: General;  Laterality: Left;  . BREAST LUMPECTOMY WITH RADIOACTIVE SEED AND SENTINEL LYMPH NODE BIOPSY Right 10/26/2017   Procedure: BREAST LUMPECTOMY WITH RADIOACTIVE SEED AND SENTINEL LYMPH NODE BIOPSY;  Surgeon: Excell Seltzer, MD;  Location: [facility];  Service: General;  Laterality: Right;  . BREAST SURGERY  1990's   right- fibroid cyst  . CHOLECYSTECTOMY    . SEPTOPLASTY    . TONSILLECTOMY     Social History   Occupational History  . Not on file  Tobacco Use  . Smoking status: Former Smoker    Packs/day: 0.25    Years: 4.00    Pack years: 1.00    Types: Cigarettes  . Smokeless tobacco: Never Used  . Tobacco comment: Cigarette use was 50 years ago  Vaping Use  . Vaping Use: Never used    Substance and Sexual Activity  . Alcohol use: Not Currently  . Drug use: No  . Sexual activity: Never    Comment: menarche age 74, 66st birth age 41, G65, HRT x 7 years?

## 2020-02-12 ENCOUNTER — Other Ambulatory Visit: Payer: Self-pay

## 2020-02-12 ENCOUNTER — Ambulatory Visit (HOSPITAL_COMMUNITY): Payer: Medicare PPO | Attending: Cardiology

## 2020-02-12 DIAGNOSIS — I4821 Permanent atrial fibrillation: Secondary | ICD-10-CM | POA: Diagnosis not present

## 2020-02-12 LAB — ECHOCARDIOGRAM COMPLETE
Area-P 1/2: 4.18 cm2
S' Lateral: 2.3 cm

## 2020-02-21 ENCOUNTER — Other Ambulatory Visit: Payer: Self-pay

## 2020-02-21 ENCOUNTER — Ambulatory Visit (INDEPENDENT_AMBULATORY_CARE_PROVIDER_SITE_OTHER): Payer: Medicare PPO

## 2020-02-21 DIAGNOSIS — Z0000 Encounter for general adult medical examination without abnormal findings: Secondary | ICD-10-CM | POA: Diagnosis not present

## 2020-02-21 NOTE — Patient Instructions (Signed)
Adrienne [name] , Thank you for taking time to come for your Medicare Wellness Visit. I appreciate your ongoing commitment to your health goals. Please review the following plan we discussed and let me know if I can assist you in the future.   Screening recommendations/referrals: Colonoscopy: No longer required Mammogram: Up to date, next due 10/09/2020 Bone Density: Up to date, next due 01/03/2022 Recommended yearly ophthalmology/optometry visit for glaucoma screening and checkup Recommended yearly dental visit for hygiene and checkup  Vaccinations: Influenza vaccine: Up to date, next due fall 2022 Pneumococcal vaccine: Completed series Tdap vaccine: Currently due, you may await until injury to receive or contact your insurance to discuss cost  Shingles vaccine: Currently due for Shingrix, you may receive this at your pharmacy     Advanced directives: Please bring copies of your advanced medical directives so that we may scan them into your chart  Conditions/risks identified: None   Next appointment: 02/21/2021 @ 1:15 PM with [facility] 65 Years and Older, Female Preventive care refers to lifestyle choices and visits with your health care provider that can promote health and wellness. What does preventive care include?  A yearly physical exam. This is also called an annual well check.  Dental exams once or twice a year.  Routine eye exams. Ask your health care provider how often you should have your eyes checked.  Personal lifestyle choices, including:  Daily care of your teeth and gums.  Regular physical activity.  Eating a healthy diet.  Avoiding tobacco and drug use.  Limiting alcohol use.  Practicing safe sex.  Taking low-dose aspirin every day.  Taking vitamin and mineral supplements as recommended by your health care provider. What happens during an annual well check? The services and screenings done by your health care provider  during your annual well check will depend on your age, overall health, lifestyle risk factors, and family history of disease. Counseling  Your health care provider may ask you questions about your:  Alcohol use.  Tobacco use.  Drug use.  Emotional well-being.  Home and relationship well-being.  Sexual activity.  Eating habits.  History of falls.  Memory and ability to understand (cognition).  Work and work Statistician.  Reproductive health. Screening  You may have the following tests or measurements:  Height, weight, and BMI.  Blood pressure.  Lipid and cholesterol levels. These may be checked every 5 years, or more frequently if you are over 61 years old.  Skin check.  Lung cancer screening. You may have this screening every year starting at age 56 if you have a 30-pack-year history of smoking and currently smoke or have quit within the past 15 years.  Fecal occult blood test (FOBT) of the stool. You may have this test every year starting at age 105.  Flexible sigmoidoscopy or colonoscopy. You may have a sigmoidoscopy every 5 years or a colonoscopy every 10 years starting at age 48.  Hepatitis C blood test.  Hepatitis B blood test.  Sexually transmitted disease (STD) testing.  Diabetes screening. This is done by checking your blood sugar (glucose) after you have not eaten for a while (fasting). You may have this done every 1-3 years.  Bone density scan. This is done to screen for osteoporosis. You may have this done starting at age 60.  Mammogram. This may be done every 1-2 years. Talk to your health care provider about how often you should have regular mammograms. Talk with your health  care provider about your test results, treatment options, and if necessary, the need for more tests. Vaccines  Your health care provider may recommend certain vaccines, such as:  Influenza vaccine. This is recommended every year.  Tetanus, diphtheria, and acellular pertussis  (Tdap, Td) vaccine. You may need a Td booster every 10 years.  Zoster vaccine. You may need this after age 42.  Pneumococcal 13-valent conjugate (PCV13) vaccine. One dose is recommended after age 47.  Pneumococcal polysaccharide (PPSV23) vaccine. One dose is recommended after age 20. Talk to your health care provider about which screenings and vaccines you need and how often you need them. This information is not intended to replace advice given to you by your health care provider. Make sure you discuss any questions you have with your health care provider. Document Released: 05/17/2015 Document Revised: 01/08/2016 Document Reviewed: 02/19/2015 Elsevier Interactive Patient Education  2017 [place] Prevention in the Home Falls can cause injuries. They can happen to people of all ages. There are many things you can do to make your home safe and to help prevent falls. What can I do on the outside of my home?  Regularly fix the edges of walkways and driveways and fix any cracks.  Remove anything that might make you trip as you walk through a door, such as a raised step or threshold.  Trim any bushes or trees on the path to your home.  Use bright outdoor lighting.  Clear any walking paths of anything that might make someone trip, such as rocks or tools.  Regularly check to see if handrails are loose or broken. Make sure that both sides of any steps have handrails.  Any raised decks and porches should have guardrails on the edges.  Have any leaves, snow, or ice cleared regularly.  Use sand or salt on walking paths during winter.  Clean up any spills in your garage right away. This includes oil or grease spills. What can I do in the bathroom?  Use night lights.  Install grab bars by the toilet and in the tub and shower. Do not use towel bars as grab bars.  Use non-skid mats or decals in the tub or shower.  If you need to sit down in the shower, use a plastic, non-slip  stool.  Keep the floor dry. Clean up any water that spills on the floor as soon as it happens.  Remove soap buildup in the tub or shower regularly.  Attach bath mats securely with double-sided non-slip rug tape.  Do not have throw rugs and other things on the floor that can make you trip. What can I do in the bedroom?  Use night lights.  Make sure that you have a light by your bed that is easy to reach.  Do not use any sheets or blankets that are too big for your bed. They should not hang down onto the floor.  Have a firm chair that has side arms. You can use this for support while you get dressed.  Do not have throw rugs and other things on the floor that can make you trip. What can I do in the kitchen?  Clean up any spills right away.  Avoid walking on wet floors.  Keep items that you use a lot in easy-to-reach places.  If you need to reach something above you, use a strong step stool that has a grab bar.  Keep electrical cords out of the way.  Do not use floor  polish or wax that makes floors slippery. If you must use wax, use non-skid floor wax.  Do not have throw rugs and other things on the floor that can make you trip. What can I do with my stairs?  Do not leave any items on the stairs.  Make sure that there are handrails on both sides of the stairs and use them. Fix handrails that are broken or loose. Make sure that handrails are as long as the stairways.  Check any carpeting to make sure that it is firmly attached to the stairs. Fix any carpet that is loose or worn.  Avoid having throw rugs at the top or bottom of the stairs. If you do have throw rugs, attach them to the floor with carpet tape.  Make sure that you have a light switch at the top of the stairs and the bottom of the stairs. If you do not have them, ask someone to add them for you. What else can I do to help prevent falls?  Wear shoes that:  Do not have high heels.  Have rubber bottoms.  Are  comfortable and fit you well.  Are closed at the toe. Do not wear sandals.  If you use a stepladder:  Make sure that it is fully opened. Do not climb a closed stepladder.  Make sure that both sides of the stepladder are locked into place.  Ask someone to hold it for you, if possible.  Clearly mark and make sure that you can see:  Any grab bars or handrails.  First and last steps.  Where the edge of each step is.  Use tools that help you move around (mobility aids) if they are needed. These include:  Canes.  Walkers.  Scooters.  Crutches.  Turn on the lights when you go into a dark area. Replace any light bulbs as soon as they burn out.  Set up your furniture so you have a clear path. Avoid moving your furniture around.  If any of your floors are uneven, fix them.  If there are any pets around you, be aware of where they are.  Review your medicines with your doctor. Some medicines can make you feel dizzy. This can increase your chance of falling. Ask your doctor what other things that you can do to help prevent falls. This information is not intended to replace advice given to you by your health care provider. Make sure you discuss any questions you have with your health care provider. Document Released: 02/14/2009 Document Revised: 09/26/2015 Document Reviewed: 05/25/2014 Elsevier Interactive Patient Education  2017 [employer].

## 2020-02-21 NOTE — Progress Notes (Addendum)
Subjective:   Adrienne [name] is a 85 y.o. female who presents for Medicare Annual (Subsequent) preventive examination.  I connected with Donna Christen  today by telephone and verified that I am speaking with the correct person using two identifiers. Location patient: home Location provider: work Persons participating in the virtual visit: patient, provider.   I discussed the limitations, risks, security and privacy concerns of performing an evaluation and management service by telephone and the availability of in person appointments. I also discussed with the patient that there may be a patient responsible charge related to this service. The patient expressed understanding and verbally consented to this telephonic visit.    Interactive audio and video telecommunications were attempted between this provider and patient, however failed, due to patient having technical difficulties OR patient did not have access to video capability.  We continued and completed visit with audio only.     Review of Systems    N/A  Cardiac Risk Factors include: dyslipidemia;diabetes mellitus;advanced age (>33mn, >>85women)     Objective:    Today's Vitals   02/21/20 1316  PainSc: 5    There is no height or weight on file to calculate BMI.  Advanced Directives 02/21/2020 04/11/2019 05/20/2018 03/15/2018 02/11/2018 02/11/2018 02/05/2018  Does Patient Have a Medical Advance Directive? Yes Yes Yes Yes No No No  Type of AParamedicof ACenter CityLiving will Living will [place]Living will [place] will - - -  Does patient want to make changes to medical advance directive? - No - Patient declined - - - - -  Copy of HSandyin Chart? No - copy requested - No - copy requested No - copy requested - - -  Would patient like information on creating a medical advance directive? - No - Patient declined - - - - No - Patient  declined    Current Medications (verified) Outpatient Encounter Medications as of 02/21/2020  Medication Sig  . Accu-Chek Softclix Lancets lancets Use as instructed  . allopurinol (ZYLOPRIM) 300 MG tablet Take 1 tablet (300 mg total) by mouth daily.  .Marland Kitchenatenolol (TENORMIN) 25 MG tablet TAKE 1 TABLET BY MOUTH EVERY DAY  . Blood Glucose Monitoring Suppl (BLOOD GLUCOSE MONITOR SYSTEM) w/Device KIT Accu Check Aviva Meter  . chlorpheniramine (CHLOR-TRIMETON) 4 MG tablet Take 4 mg 2 (two) times daily as needed by mouth for allergies.  .Marland Kitchendiltiazem (CARDIZEM CD) 360 MG 24 hr capsule TAKE 1 CAPSULE BY MOUTH EVERY DAY  . ELIQUIS 2.5 MG TABS tablet TAKE 1 TABLET BY MOUTH TWICE A DAY  . fish oil-omega-3 fatty acids 1000 MG capsule Take 1 g by mouth daily.  .Marland Kitchenglucose blood (ACCU-CHEK AVIVA PLUS) test strip USE TO TEST TWICE DAILY **ICD-10 CODE E13.9**  . metFORMIN (GLUCOPHAGE) 500 MG tablet Take 1 tablet (500 mg total) by mouth 2 (two) times daily.  . Multiple Vitamin (MULTIVITAMIN) tablet Take 1 tablet by mouth daily.  .Marland KitchenOVER THE COUNTER MEDICATION Take by mouth 2 (two) times daily. Presavision- for macular degeneration prevention  . rosuvastatin (CRESTOR) 5 MG tablet TAKE 1 TABLET BY MOUTH EVERY DAY  . VITAMIN D, ERGOCALCIFEROL, PO Take by mouth.  . gabapentin (NEURONTIN) 100 MG capsule Take 1 capsule (100 mg total) by mouth at bedtime. (Patient not taking: Reported on 02/21/2020)  . phenazopyridine (PYRIDIUM) 95 MG tablet Take 95 mg by mouth 3 (three) times daily as needed for pain. (Patient not taking:  Reported on 02/21/2020)  . [DISCONTINUED] TOBREX 0.3 % ophthalmic ointment APPLY A SMALL AMOUNT ON EYELID THREE TIMES A DAY AS NEEDED (Patient not taking: Reported on 01/29/2020)   No facility-administered encounter medications on file as of 02/21/2020.    Allergies (verified) Penicillins, Other, Prednisone, Radiaplexrx [woun'dres hydrogel wound dress], and Sulfamethoxazole   History: Past Medical  History:  Diagnosis Date  . Allergy   . Atrial fibrillation ([place])   . Breast cancer ([place]) 08/25/12   Invasive ductal ca,DCIS  . Diabetes mellitus    type II  . Family history of cancer   . Gout   . Hearing loss   . History of breast cancer   . History of frequent urinary tract infections   . History of radiation therapy 03/21/18- 04/19/18   Right Breast 2.67 Gy X 15 fractions, right breast boost 2 Gy X 5 fractions.   Marland Kitchen Hx of radiation therapy 11/22/12- 12/19/12   breast 4250 cGy 17 sessions, left breast boost 750 cGy 3 sessions  . Hyperlipidemia   . Hypertension   . Osteopenia    Past Surgical History:  Procedure Laterality Date  . ABDOMINAL HYSTERECTOMY Bilateral 1999   w/b/l salpingo-oopherectomy  . APPENDECTOMY    . BREAST LUMPECTOMY WITH NEEDLE LOCALIZATION AND AXILLARY SENTINEL LYMPH NODE BX Left 09/12/2012   Procedure: LEFT NEEDLE LOCALIZATION BREAST LUMPECTOMY TION AND AXILLARY SENTINEL LYMPH NODE BX;  Surgeon: Edward Jolly, MD;  Location: [facility];  Service: General;  Laterality: Left;  . BREAST LUMPECTOMY WITH RADIOACTIVE SEED AND SENTINEL LYMPH NODE BIOPSY Right 10/26/2017   Procedure: BREAST LUMPECTOMY WITH RADIOACTIVE SEED AND SENTINEL LYMPH NODE BIOPSY;  Surgeon: Excell Seltzer, MD;  Location: [facility];  Service: General;  Laterality: Right;  . BREAST SURGERY  1990's   right- fibroid cyst  . CHOLECYSTECTOMY    . SEPTOPLASTY    . TONSILLECTOMY     Family History  Problem Relation Age of Onset  . Hypertension Mother   . Heart disease Mother        Murmur and irregular heart disease  . Dementia Mother        d. 52  . Hypertension Father   . Aneurysm Father 3  . Breast cancer Cousin 45       mat first cousin   Social History   Socioeconomic History  . Marital status: Married    Spouse name: Not on file  . Number of children: 3  . Years of education: Not on file  . Highest education level: Not on file  Occupational History  . Not  on file  Tobacco Use  . Smoking status: Former Smoker    Packs/day: 0.25    Years: 4.00    Pack years: 1.00    Types: Cigarettes  . Smokeless tobacco: Never Used  . Tobacco comment: Cigarette use was 50 years ago  Vaping Use  . Vaping Use: Never used  Substance and Sexual Activity  . Alcohol use: Not Currently  . Drug use: No  . Sexual activity: Never    Comment: menarche age 50, 87st birth age 31, G54, HRT x 7 years?  Other Topics Concern  . Not on file  Social History Narrative   Married - husband in memory care unit    Three children ( One lives at [place] and two in [place]      She likes to read and go to [employer]  Resource Strain: Low Risk   . Difficulty of Paying Living Expenses: Not hard at all  Food Insecurity: No Food Insecurity  . Worried About Charity fundraiser in the Last Year: Never true  . Ran Out of Food in the Last Year: Never true  Transportation Needs: No Transportation Needs  . Lack of Transportation (Medical): No  . Lack of Transportation (Non-Medical): No  Physical Activity: Sufficiently Active  . Days of Exercise per Week: 7 days  . Minutes of Exercise per Session: 30 min  Stress: No Stress Concern Present  . Feeling of Stress : Not at all  Social Connections: Moderately Integrated  . Frequency of Communication with Friends and Family: More than three times a week  . Frequency of Social Gatherings with Friends and Family: More than three times a week  . Attends Religious Services: More than 4 times per year  . Active Member of Clubs or Organizations: Yes  . Attends Archivist Meetings: More than 4 times per year  . Marital Status: Widowed    Tobacco Counseling Counseling given: Not Answered Comment: Cigarette use was 50 years ago   Clinical Intake:  Pre-visit preparation completed: Yes  Pain : 0-10 Pain Score: 5  Pain Type: Chronic pain Pain Location: Hip Pain Orientation:  Left, Right Pain Descriptors / Indicators: Aching Pain Onset: More than a month ago Pain Frequency: Intermittent Pain Relieving Factors: ice  Pain Relieving Factors: ice  Nutritional Risks: None Diabetes: Yes (Patient states checks glucose daily) CBG done?: No Did pt. bring in CBG monitor from home?: No  How often do you need to have someone help you when you read instructions, pamphlets, or other written materials from your doctor or pharmacy?: 1 - Never What is the last grade level you completed in school?: Masters Degree in Education  Diabetic?Yes  Interpreter Needed?: No  Information entered by :: [place] of Daily Living In your present state of health, do you have any difficulty performing the following activities: 02/21/2020  Hearing? Y  Comment has bilateral hearing aids  Vision? N  Difficulty concentrating or making decisions? Y  Comment somewhat forgetful  Walking or climbing stairs? Y  Comment has some issues with hips avoids stairs when possible  Dressing or bathing? N  Doing errands, shopping? N  Preparing Food and eating ? N  Using the Toilet? N  In the past six months, have you accidently leaked urine? Y  Comment Has some issues with bladder leakage, does kegel exercise and wears a incontinence  Do you have problems with loss of bowel control? N  Managing your Medications? N  Managing your Finances? N  Housekeeping or managing your Housekeeping? N  Some recent data might be hidden    Patient Care Team: [name], NP as PCP - General (Family Medicine) Stanford Breed Denice Bors, MD as PCP - Cardiology (Cardiology)  Indicate any recent Medical Services you may have received from other than [facility] providers in the past year (date may be approximate).     Assessment:   This is a routine wellness examination for [facility].  Hearing/Vision screen  Hearing Screening   _0  _1  _2  _3  _4  _5  _6  _7  _8   Right ear:            Left ear:           Vision Screening Comments: Patient states gets eyes checked once per year   Dietary issues and exercise activities discussed: Current Exercise Habits: Home  exercise routine, Type of exercise: walking, Time (Minutes): 30, Frequency (Times/Week): 7, Weekly Exercise (Minutes/Week): 210, Intensity: Mild, Exercise limited by: orthopedic condition(s)  Goals    . Patient Stated     I would like to get back to walking 2.5 miles per day      Depression Screen PHQ 2/9 Scores 02/21/2020 04/29/2018 10/11/2017 03/10/2016 03/10/2016 08/16/2015 04/01/2015  PHQ - 2 Score 0 0 0 0 0 0 0  PHQ- 9 Score 0 - - - - - -    Fall Risk Fall Risk  02/21/2020 04/29/2018 10/11/2017 03/10/2016 03/10/2016  Falls in the past year? 0 0 No No No  Number falls in past yr: 0 - - - -  Injury with Fall? 0 - - - -  Risk for fall due to : Orthopedic patient - - - -  Follow up Falls evaluation completed - - - -    Any stairs in or around the home? No  If so, are there any without handrails? No  Home free of loose throw rugs in walkways, pet beds, electrical cords, etc? Yes  Adequate lighting in your home to reduce risk of falls? Yes   ASSISTIVE DEVICES UTILIZED TO PREVENT FALLS:  Life alert? Yes  Use of a cane, walker or w/c? Yes  Grab bars in the bathroom? Yes  Shower chair or bench in shower? Yes  Elevated toilet seat or a handicapped toilet? Yes     Cognitive Function:     6CIT Screen 02/21/2020  What Year? 0 points  What month? 0 points  What time? 0 points  Count back from 20 0 points  Months in reverse 0 points  Repeat phrase 0 points  Total Score 0    Immunizations Immunization History  Administered Date(s) Administered  . Fluad Quad(high Dose 65+) 02/04/2019, 01/09/2020  . Influenza Split 02/17/2012, 02/10/2013, 02/15/2014  . Influenza Whole 05/04/2005, 01/26/2008, 02/16/2009, 02/01/2010  . Influenza, High Dose Seasonal PF 01/25/2015, 01/22/2016, 02/06/2017, 02/06/2017,  01/21/2018  . Moderna SARS-COVID-2 Vaccination 05/08/2019, 06/06/2019  . Pneumococcal Conjugate-13 03/27/2014  . Pneumococcal Polysaccharide-23 09/30/2001, 05/28/2010  . Td 12/28/2003  . Tetanus 03/27/2014  . Zoster 12/08/2007    TDAP status: Due, Education has been provided regarding the importance of this vaccine. Advised may receive this vaccine at local pharmacy or Health Dept. Aware to provide a copy of the vaccination record if obtained from local pharmacy or Health Dept. Verbalized acceptance and understanding. Flu Vaccine status: Declined, Education has been provided regarding the importance of this vaccine but patient still declined. Advised may receive this vaccine at local pharmacy or Health Dept. Aware to provide a copy of the vaccination record if obtained from local pharmacy or Health Dept. Verbalized acceptance and understanding. Pneumococcal vaccine status: Up to date Covid-19 vaccine status: Completed vaccines  Qualifies for Shingles Vaccine? Yes   Zostavax completed No   Shingrix Completed?: No.    Education has been provided regarding the importance of this vaccine. Patient has been advised to call insurance company to determine out of pocket expense if they have not yet received this vaccine. Advised may also receive vaccine at local pharmacy or Health Dept. Verbalized acceptance and understanding.  Screening Tests Health Maintenance  Topic Date Due  . URINE MICROALBUMIN  03/24/2016  . OPHTHALMOLOGY EXAM  03/30/2018  . FOOT EXAM  04/30/2019  . HEMOGLOBIN A1C  05/01/2020  . MAMMOGRAM  10/09/2020  . TETANUS/TDAP  03/27/2024  . INFLUENZA VACCINE  Completed  . DEXA SCAN  Completed  . COVID-19 Vaccine  Completed  . PNA vac Low Risk Adult  Completed    Health Maintenance  Health Maintenance Due  Topic Date Due  . URINE MICROALBUMIN  03/24/2016  . OPHTHALMOLOGY EXAM  03/30/2018  . FOOT EXAM  04/30/2019    Colorectal cancer screening: No longer required.    Mammogram status: Completed 10/10/2019. Repeat every year Bone Density status: Completed 01/04/2020. Results reflect: Bone density results: OSTEOPENIA. Repeat every 2 years.  Lung Cancer Screening: (Low Dose CT Chest recommended if Age 39-80 years, 30 pack-year currently smoking OR have quit w/in 15years.) does not qualify.   Lung Cancer Screening Referral: N/A   Additional Screening:  Hepatitis C Screening: does not qualify;   Vision Screening: Recommended annual ophthalmology exams for early detection of glaucoma and other disorders of the eye. Is the patient up to date with their annual eye exam?  Yes  Who is the provider or what is the name of the office in which the patient attends annual eye exams? Dr. Sabra Heck  If pt is not established with a provider, would they like to be referred to a provider to establish care? No .   Dental Screening: Recommended annual dental exams for proper oral hygiene  Community Resource Referral / Chronic Care Management: CRR required this visit?  No   CCM required this visit?  No      Plan:     I have personally reviewed and noted the following in the patient's chart:   . Medical and social history . Use of alcohol, tobacco or illicit drugs  . Current medications and supplements . Functional ability and status . Nutritional status . Physical activity . Advanced directives . List of other physicians . Hospitalizations, surgeries, and ER visits in previous 12 months . Vitals . Screenings to include cognitive, depression, and falls . Referrals and appointments  In addition, I have reviewed and discussed with patient certain preventive protocols, quality metrics, and best practice recommendations. A written personalized care plan for preventive services as well as general preventive health recommendations were provided to patient.     Ofilia Neas, LPN   [id number]   Nurse Notes: None

## 2020-03-13 ENCOUNTER — Other Ambulatory Visit: Payer: Self-pay | Admitting: Adult Health

## 2020-03-13 DIAGNOSIS — E139 Other specified diabetes mellitus without complications: Secondary | ICD-10-CM

## 2020-04-18 ENCOUNTER — Other Ambulatory Visit: Payer: Self-pay | Admitting: Cardiology

## 2020-04-18 ENCOUNTER — Other Ambulatory Visit: Payer: Self-pay | Admitting: Adult Health

## 2020-04-18 DIAGNOSIS — E139 Other specified diabetes mellitus without complications: Secondary | ICD-10-CM

## 2020-04-19 NOTE — Telephone Encounter (Signed)
Prescription refill request for Eliquis received. Indication: atrial fibrillation Last office visit: 01/2020 [place] Scr: 1.21 10/2019 Age: 85 Weight: 56.7 kg  Prescription refilled

## 2020-04-30 NOTE — Progress Notes (Signed)
[place]   Telephone:([phone] Fax:(336) [phone]   Clinic Follow up Note   Patient Care Team: [name], NP as PCP - General (Family Medicine) Stanford Breed Denice Bors, MD as PCP - Cardiology (Cardiology) Date of Service: 05/01/2020   CHIEF COMPLAINT: Follow up right breast cancer   SUMMARY OF ONCOLOGIC HISTORY: Oncology History Overview Note  Cancer Staging Breast cancer of upper-outer quadrant of right female breast [facility]) Staging form: Breast, AJCC 8th Edition - Clinical stage from 09/22/2017: Stage IB (cT1b, cN0, cM0, G2, ER-, PR-, HER2-) - Signed by Truitt Merle, MD on 10/08/2017  Breast cancer, left breast [facility]) Staging form: Breast, AJCC 7th Edition - Clinical stage from 09/12/2012: Stage IA (T1b, N0, M0) - Unsigned      Breast cancer, left breast ([place])  08/25/2012 Receptors her2   ER 100% positive, PR 88% positive, HER-2 negative, Ki-67 16%   08/30/2012 Initial Diagnosis   Breast cancer, left breast   09/12/2012 Pathology Results   T1bN0 Grade 1 invasive ductal carcinoma, and DCIS.   09/12/2012 Surgery   Left breast lumpectomy and sentinel lymph node biopsy, negative margins.   11/09/2012 - 12/13/2012 Radiation Therapy   Adjuvant breast radiation   12/2012 - 11/2017 Anti-estrogen oral therapy   Anastrozole 1 mg daily, switched to Aromasin 25 mg daily in Jan 2015 due to diarrhea. Her Exemestane was switched to letrozole in 08/2017 due to high copay. She completed her 5 years in 11/2017.    09/01/2016 Imaging   [name] Outside films Breast form [facility] 09/01/2016 IMPRESSION: Probably Benign 1. No significant change in oval hypoechoic masses in the left breast at 11:00 2 cm from the nipple and 10:30 4 cm from the nipple. Both of these masses resemble the area of fat necrosis previously biopsies at 2:00. In addition, both masses have developing central benign or dystrophic appearing calcifications and are favored to be other areas of fat necrosis. A 6 month follow-up left  breast mammogram and ultrasound is recommended.   09/01/2016 Mammogram   [facility] Mammogram from [facility] 09/01/16 IMPRESSION  Incomplete - additional imaging evaluation needed Ultrasound of the upper medial left breast mass is recommended.    03/09/2017 Mammogram   Previous lumpectomy changes in the upper outer left breast anterior depth with stable associated dystrophic calcifications.  Biopsy clip at the 2:00 in the left breast near the lumpectomy site corresponding to previously biopsied benign fat necrosis.  Persistent round mass in the upper medial left breast with benign-appearing central round calcification.  No significant masses, calcifications, or other findings are seen in the breast  Impression: ultrasound is recommended for the left breast   03/09/2017 Breast US   Left breast ultrasound impression:  No significant change in oval hypoechoic masses in the left breast at 11:00 2 cm from the nipple and 10:30 4 cm from the nipple.  Both of these masses resemble the area of fat necrosis previously biopsied at 2:00.  In addition, both masses have developing central benign or dystrophic appearing calcifications and are favored to be other areas of fat necrosis.  A 60-monthfollow-up bilateral mammogram and left breast ultrasound is recommended.   09/14/2017 Breast [place]  Breast [place] 09[place]14[place]17 at [facility]  IMPRESSION:  The 1 cm oval mass in the left breast at 9:30 posterior depth is highly suggestive of malignancy. An Ultrasound guided biopsy is recommended.  The 0.8 cm round mass in the left breast at 11-12 o'clock anterior depth is suspicious of malignancy. An  ultrasound guided biopsy is recommended.    09/22/2017 Pathology Results   Diagnosis 1. Breast, right, needle core biopsy - INVASIVE DUCTAL CARCINOMA, MSBR GRADE II/III. - DUCTAL CARCINOMA IN SITU WITH NECROSIS. 2. Breast, left, needle core biopsy - FAT NECROSIS. - NO EVIDENCE OF MALIGNANCY.   10/17/2017 Genetic Testing   Negative  genetic testing on the Multicancer panel.  The Multi-Gene Panel offered by [facility] includes sequencing and/or deletion duplication testing of the following 83 genes: ALK, APC, ATM, AXIN2,BAP1,  BARD1, BLM, BMPR1A, BRCA1, BRCA2, BRIP1, CASR, CDC73, CDH1, CDK4, CDKN1B, CDKN1C, CDKN2A (p14ARF), CDKN2A (p16INK4a), CEBPA, CHEK2, CTNNA1, DICER1, DIS3L2, EGFR (c.2369C>T, p.Thr790Met variant only), EPCAM (Deletion/duplication testing only), FH, FLCN, GATA2, GPC3, GREM1 (Promoter region deletion/duplication testing only), HOXB13 (c.251G>A, p.Gly84Glu), HRAS, KIT, MAX, MEN1, MET, MITF (c.952G>A, p.Glu318Lys variant only), MLH1, MSH2, MSH3, MSH6, MUTYH, NBN, NF1, NF2, NTHL1, PALB2, PDGFRA, PHOX2B, PMS2, POLD1, POLE, POT1, PRKAR1A, PTCH1, PTEN, RAD50, RAD51C, RAD51D, RB1, RECQL4, RET, RUNX1, SDHAF2, SDHA (sequence changes only), SDHB, SDHC, SDHD, SMAD4, SMARCA4, SMARCB1, SMARCE1, STK11, SUFU, TERT, TERT, TMEM127, TP53, TSC1, TSC2, VHL, WRN and WT1.  The report date is October 17, 2017.   Breast cancer of upper-outer quadrant of right female breast ([place])  09/14/2017 Mammogram   Diagnostic mammogram at [facility]  IMPRESSION:  The new 0.9 cm oval high density mass in the right breast posterior depth superior region seen on the mediolateral oblique view only is indeterminate. The 1.1 cm focal asymmetry in the left breast central to the nipple anterior depth is indeterminate.    09/14/2017 Imaging   [name] Bilateral at [facility] IMPRESSION: The 1 cm oval mass in the right breast at 9:30 posterior depth is highly suggestive of malignancy. The 0.8 cm round mass in the left breast at 11-12 o'clock anterior depth is suspicious of malignancy.    09/22/2017 Cancer Staging   Staging form: Breast, AJCC 8th Edition - Clinical stage from 09/22/2017: Stage IB (cT1b, cN0, cM0, G2, ER-, PR-, HER2-) - Signed by Truitt Merle, MD on 10/08/2017   09/22/2017 Pathology Results   Bilateral needle core biopsy 1. Breast, right, needle core biopsy - INVASIVE  DUCTAL CARCINOMA, MSBR GRADE II/III. - DUCTAL CARCINOMA IN SITU WITH NECROSIS. 2. Breast, left, needle core biopsy - FAT NECROSIS. - NO EVIDENCE OF MALIGNANCY.   09/25/2017 Receptors her2   Estrogen Receptor: 0 Progesterone 0 HER2: Negative. Ki-67: 80%    10/08/2017 Initial Diagnosis   Breast cancer of upper-outer quadrant of right female breast ([place])   10/26/2017 Surgery   BREAST LUMPECTOMY WITH RADIOACTIVE SEED AND SENTINEL LYMPH NODE BIOPSY by Dr. Excell Seltzer  10/26/17   10/26/2017 Pathology Results   Diagnosis 10/26/17 1. Breast, lumpectomy, Right - INVASIVE DUCTAL CARCINOMA, GRADE 2, SPANNING 1 CM. - HIGH GRADE DUCTAL CARCINOMA IN SITU WITH NECROSIS. - FINAL RESECTION MARGINS (PARTS #2, 3, 4) ARE NEGATIVE. - BIOPSY SITE. - SEE ONCOLOGY TABLE. 2. Breast, excision, additional anterior margin- 2 pieces right - BENIGN BREAST TISSUE. 3. Breast, excision, Right unoriented anterior margin - BENIGN BREAST TISSUE. 4. Breast, excision, Right deep margin - BENIGN BREAST TISSUE. 5. Lymph node, sentinel, biopsy, Right Axillary - ONE OF ONE LYMPH NODES NEGATIVE FOR CARCINOMA (0/1).   10/26/2017 Cancer Staging   Staging form: Breast, AJCC 8th Edition - Pathologic stage from 10/26/2017: Stage IB (pT1b, pN0, cM0, G2, ER-, PR-, HER2-) - Signed by Truitt Merle, MD on 11/12/2017   12/03/2017 - 02/25/2018 Chemotherapy   Weekly Abraxane for 12 weeks 12/03/17-02/25/18   03/2018 -  04/19/2018 Radiation Therapy   With Dr. Isidore Moos     CURRENT THERAPY: Surveillance   INTERVAL HISTORY: Ms. Lyons returns for follow up as scheduled. She was last seen by Dr. Burr Medico 10/2019. She is doing PT for right hip arthritis and left hip bursitis which is helping. Also does chair yoga. Denies new bone pain. She ambulates with a cane to avoid fall. She takes calcium/vit D. The scar on her right chest wall is getting "deeper."  All other systems were reviewed with the patient and are negative.   MEDICAL HISTORY:  Past Medical  History:  Diagnosis Date  . Allergy   . Atrial fibrillation ([place])   . Breast cancer ([place]) 08/25/12   Invasive ductal ca,DCIS  . Diabetes mellitus    type II  . Family history of cancer   . Gout   . Hearing loss   . History of breast cancer   . History of frequent urinary tract infections   . History of radiation therapy 03/21/18- 04/19/18   Right Breast 2.67 Gy X 15 fractions, right breast boost 2 Gy X 5 fractions.   Marland Kitchen Hx of radiation therapy 11/22/12- 12/19/12   breast 4250 cGy 17 sessions, left breast boost 750 cGy 3 sessions  . Hyperlipidemia   . Hypertension   . Osteopenia     SURGICAL HISTORY: Past Surgical History:  Procedure Laterality Date  . ABDOMINAL HYSTERECTOMY Bilateral 1999   w/b/l salpingo-oopherectomy  . APPENDECTOMY    . BREAST LUMPECTOMY WITH NEEDLE LOCALIZATION AND AXILLARY SENTINEL LYMPH NODE BX Left 09/12/2012   Procedure: LEFT NEEDLE LOCALIZATION BREAST LUMPECTOMY TION AND AXILLARY SENTINEL LYMPH NODE BX;  Surgeon: Edward Jolly, MD;  Location: [facility];  Service: General;  Laterality: Left;  . BREAST LUMPECTOMY WITH RADIOACTIVE SEED AND SENTINEL LYMPH NODE BIOPSY Right 10/26/2017   Procedure: BREAST LUMPECTOMY WITH RADIOACTIVE SEED AND SENTINEL LYMPH NODE BIOPSY;  Surgeon: Excell Seltzer, MD;  Location: [facility];  Service: General;  Laterality: Right;  . BREAST SURGERY  1990's   right- fibroid cyst  . CHOLECYSTECTOMY    . SEPTOPLASTY    . TONSILLECTOMY      I have reviewed the social history and family history with the patient and they are unchanged from previous note.  ALLERGIES:  is allergic to penicillins, other, prednisone, radiaplexrx [woun'dres hydrogel wound dress], and sulfamethoxazole.  MEDICATIONS:  Current Outpatient Medications  Medication Sig Dispense Refill  . Accu-Chek Softclix Lancets lancets Use as instructed 200 each 12  . allopurinol (ZYLOPRIM) 300 MG tablet Take 1 tablet (300 mg total) by mouth daily. 90  tablet 3  . atenolol (TENORMIN) 25 MG tablet TAKE 1 TABLET BY MOUTH EVERY DAY 90 tablet 3  . Blood Glucose Monitoring Suppl (BLOOD GLUCOSE MONITOR SYSTEM) w/Device KIT Accu Check Aviva Meter 1 kit 0  . chlorpheniramine (CHLOR-TRIMETON) 4 MG tablet Take 4 mg 2 (two) times daily as needed by mouth for allergies.    Marland Kitchen diltiazem (CARDIZEM CD) 360 MG 24 hr capsule TAKE 1 CAPSULE BY MOUTH EVERY DAY 90 capsule 3  . ELIQUIS 2.5 MG TABS tablet TAKE 1 TABLET BY MOUTH TWICE A DAY 180 tablet 1  . fish oil-omega-3 fatty acids 1000 MG capsule Take 1 g by mouth daily.    Marland Kitchen gabapentin (NEURONTIN) 100 MG capsule Take 1 capsule (100 mg total) by mouth at bedtime. (Patient not taking: Reported on 02/21/2020) 90 capsule 1  . glucose blood (ACCU-CHEK AVIVA PLUS) test strip USE TO  TEST TWICE DAILY **ICD-10 CODE E13.9** 200 each 3  . metFORMIN (GLUCOPHAGE) 500 MG tablet TAKE 1 TABLET BY MOUTH TWICE A DAY 180 tablet 0  . Multiple Vitamin (MULTIVITAMIN) tablet Take 1 tablet by mouth daily.    Marland Kitchen OVER THE COUNTER MEDICATION Take by mouth 2 (two) times daily. Presavision- for macular degeneration prevention    . phenazopyridine (PYRIDIUM) 95 MG tablet Take 95 mg by mouth 3 (three) times daily as needed for pain. (Patient not taking: Reported on 02/21/2020)    . rosuvastatin (CRESTOR) 5 MG tablet TAKE 1 TABLET BY MOUTH EVERY DAY 90 tablet 1  . VITAMIN D, ERGOCALCIFEROL, PO Take by mouth.     No current facility-administered medications for this visit.    PHYSICAL EXAMINATION: ECOG PERFORMANCE STATUS: 1 - Symptomatic but completely ambulatory  Vitals:   05/01/20 0912  BP: (!) 176/95  Pulse: 98  Resp: 18  Temp: (!) 97 F (36.1 C)  SpO2: 100%   Filed Weights   05/01/20 0912  Weight: 128 lb 12.8 oz (58.4 kg)    GENERAL:alert, no distress and comfortable SKIN: no rash  EYES:  sclera clear LYMPH:  no palpable cervical or supraclavicular lymphadenopathy LUNGS:  normal breathing effort HEART: no lower extremity  edema NEURO: alert & oriented x 3 with fluent speech, no focal motor/sensory deficits Breast: s/p right mastectomy, incisions completely healed with scar tissue. No palpable mass or nodularity along the right chest wall, left breast, or either axilla    LABORATORY DATA:  I have reviewed the data as listed CBC Latest Ref Rng & Units 05/01/2020 10/30/2019 07/20/2019  WBC 4.0 - 10.5 K/uL 7.6 7.6 8.0  Hemoglobin 12.0 - 15.0 g/dL 12.7 14.1 15.4(H)  Hematocrit 36.0 - 46.0 % 39.1 43.1 46.9(H)  Platelets 150 - 400 K/uL 200 156 182.0     CMP Latest Ref Rng & Units 05/01/2020 10/30/2019 07/20/2019  Glucose 70 - 99 mg/dL 169(H) 198(H) 176(H)  BUN 8 - 23 mg/dL 23 40(H) 27(H)  Creatinine 0.44 - 1.00 mg/dL 1.16(H) 1.21(H) 1.23(H)  Sodium 135 - 145 mmol/L 141 141 140  Potassium 3.5 - 5.1 mmol/L 3.8 4.1 4.0  Chloride 98 - 111 mmol/L 108 108 101  CO2 22 - 32 mmol/L 25 20(L) 29  Calcium 8.9 - 10.3 mg/dL 9.4 9.7 10.0  Total Protein 6.5 - 8.1 g/dL 6.9 7.0 7.0  Total Bilirubin 0.3 - 1.2 mg/dL 0.5 0.4 0.8  Alkaline Phos 38 - 126 U/L 81 90 102  AST 15 - 41 U/L _0 ALT 0 - 44 U/L _1 RADIOGRAPHIC STUDIES: I have personally reviewed the radiological images as listed and agreed with the findings in the report. No results found.   ASSESSMENT & PLAN: 85 y.o.[place], [place] woman   1. Breast cancer of upper outer quadrant of right breast, invasive ductal carcinoma, stage IB, pT1bN0M0, grade 2, Triple Negative, Ki67: 80% -Diagnosed in 09/2017, s/p right lumpectomy with SLNB, 12 weeks of adjuvant Abraxane, and adjuvant radiation  - genetic testing was negative -On surveillance  2. pT1bN0, stage IA invasive ductal carcinoma of the left breast, grade I, ER 100%, PR 88%, Ki-67 16%, HER-2/neu negative. -Diagnosed in 09/2012, s/p lumpectomy with SLNB 09/12/2012, adjuvant radiation, and adjuvant antiestrogen therapy with exemestane from 12/2012-11/2017 -Abnormal mammogram on 10/2019, biopsy  10/25/2019 showed benign fat necrosis with calcifications  3. Osteopenia -She is on vitamin D supplement daily; she stopped calcium due to hypercalcemia -DEXA  09/2017  and 01/2020 showed osteopenia and very high risk of fracture, 29% for major osteoporotic fracture and 18% for hip fracture.  This has been stable over the last 2 years - Dr. Burr Medico previously discussed Prolia, patient is reluctant due to side effects -Again discussed medication management for osteopenia including bisphosphonate vs Prolia. Due to renal dysfunction Prolia would be safer. Also reasonable to continue cal/vit D given that she is otherwise healthy and active, no recent fall.   4. Diabetes and hyperglycemia  -She had elevated blood sugar when on steroids for hip pain, DC'd steroids  -She is on metformin -f/up with PCP   5. Social Support -She lives at [facility] living and is very independent -Her children live close by her -She recently has a new great grandson, has not seen him yet due to covid19  6. Peripheral neuropathy, grade 1, secondary to chemotherapy  -resolved   Dispo:  Ms. Rule is clinically doing well.  Breast exam is unremarkable, CBC and CMP are stable.  There is no clinical concern for recurrence.  She will continue surveillance.  Next mammogram in 10/2020.  We discussed her osteopenia and high fracture risk which has been stable since 2019.  It is reasonable to consider medication, we discussed bisphosphonate versus Prolia.  Also reasonable to continue calcium with vitamin D and weightbearing exercise given that she is otherwise healthy and remains very active.  She has not had a recent fall.  She will discuss with her dentist in the event she will need Zometa.  She will think about it and let us know if she decides to proceed with medication.  Given her renal dysfunction, Prolia is the safer option. She will continue calcium/vitamin D in the meantime. I discussed with Dr. Burr Medico.   She  will return for routine surveillance visit in 6 months, or sooner if needed.   Orders Placed This Encounter  Procedures  . MM DIAG BREAST TOMO BILATERAL    Standing Status:   Future    Standing Expiration Date:   05/09/2021    Scheduling Instructions:     [facility]    Order Specific Question:   Reason for Exam (SYMPTOM  OR DIAGNOSIS REQUIRED)    Answer:   h/o bilateral breast cancer    Order Specific Question:   Preferred imaging location?    Answer:   External   All questions were answered. The patient knows to call the clinic with any problems, questions or concerns. No barriers to learning were detected. Total encounter time was 30 minutes.      Alla Feeling, NP 05/09/20

## 2020-05-01 ENCOUNTER — Ambulatory Visit: Payer: Medicare PPO | Admitting: Hematology

## 2020-05-01 ENCOUNTER — Inpatient Hospital Stay: Payer: Medicare PPO | Attending: Nurse Practitioner

## 2020-05-01 ENCOUNTER — Other Ambulatory Visit: Payer: Self-pay

## 2020-05-01 ENCOUNTER — Other Ambulatory Visit: Payer: Medicare PPO

## 2020-05-01 ENCOUNTER — Inpatient Hospital Stay: Payer: Medicare PPO | Admitting: Nurse Practitioner

## 2020-05-01 VITALS — BP 176/95 | HR 98 | Temp 97.0°F | Resp 18 | Ht 61.0 in | Wt 128.8 lb

## 2020-05-01 DIAGNOSIS — Z171 Estrogen receptor negative status [ER-]: Secondary | ICD-10-CM | POA: Diagnosis not present

## 2020-05-01 DIAGNOSIS — C50411 Malignant neoplasm of upper-outer quadrant of right female breast: Secondary | ICD-10-CM | POA: Diagnosis present

## 2020-05-01 DIAGNOSIS — Z853 Personal history of malignant neoplasm of breast: Secondary | ICD-10-CM | POA: Insufficient documentation

## 2020-05-01 DIAGNOSIS — Z170 Estrogen receptor positive status [ER+]: Secondary | ICD-10-CM | POA: Diagnosis not present

## 2020-05-01 DIAGNOSIS — E1165 Type 2 diabetes mellitus with hyperglycemia: Secondary | ICD-10-CM | POA: Diagnosis not present

## 2020-05-01 DIAGNOSIS — C50412 Malignant neoplasm of upper-outer quadrant of left female breast: Secondary | ICD-10-CM | POA: Diagnosis not present

## 2020-05-01 DIAGNOSIS — Z7984 Long term (current) use of oral hypoglycemic drugs: Secondary | ICD-10-CM | POA: Insufficient documentation

## 2020-05-01 DIAGNOSIS — M8580 Other specified disorders of bone density and structure, unspecified site: Secondary | ICD-10-CM | POA: Insufficient documentation

## 2020-05-01 DIAGNOSIS — Z923 Personal history of irradiation: Secondary | ICD-10-CM | POA: Diagnosis not present

## 2020-05-01 LAB — CBC WITH DIFFERENTIAL (CANCER CENTER ONLY)
Abs Immature Granulocytes: 0.06 10*3/uL (ref 0.00–0.07)
Basophils Absolute: 0 10*3/uL (ref 0.0–0.1)
Basophils Relative: 1 %
Eosinophils Absolute: 0.1 10*3/uL (ref 0.0–0.5)
Eosinophils Relative: 1 %
HCT: 39.1 % (ref 36.0–46.0)
Hemoglobin: 12.7 g/dL (ref 12.0–15.0)
Immature Granulocytes: 1 %
Lymphocytes Relative: 18 %
Lymphs Abs: 1.4 10*3/uL (ref 0.7–4.0)
MCH: 32.1 pg (ref 26.0–34.0)
MCHC: 32.5 g/dL (ref 30.0–36.0)
MCV: 98.7 fL (ref 80.0–100.0)
Monocytes Absolute: 0.9 10*3/uL (ref 0.1–1.0)
Monocytes Relative: 12 %
Neutro Abs: 5.2 10*3/uL (ref 1.7–7.7)
Neutrophils Relative %: 67 %
Platelet Count: 200 10*3/uL (ref 150–400)
RBC: 3.96 MIL/uL (ref 3.87–5.11)
RDW: 13.9 % (ref 11.5–15.5)
WBC Count: 7.6 10*3/uL (ref 4.0–10.5)
nRBC: 0 % (ref 0.0–0.2)

## 2020-05-01 LAB — CMP (CANCER CENTER ONLY)
ALT: 22 U/L (ref 0–44)
AST: 19 U/L (ref 15–41)
Albumin: 3.6 g/dL (ref 3.5–5.0)
Alkaline Phosphatase: 81 U/L (ref 38–126)
Anion gap: 8 (ref 5–15)
BUN: 23 mg/dL (ref 8–23)
CO2: 25 mmol/L (ref 22–32)
Calcium: 9.4 mg/dL (ref 8.9–10.3)
Chloride: 108 mmol/L (ref 98–111)
Creatinine: 1.16 mg/dL — ABNORMAL HIGH (ref 0.44–1.00)
GFR, Estimated: 47 mL/min — ABNORMAL LOW (ref >60)
Glucose, Bld: 169 mg/dL — ABNORMAL HIGH (ref 70–99)
Potassium: 3.8 mmol/L (ref 3.5–5.1)
Sodium: 141 mmol/L (ref 135–145)
Total Bilirubin: 0.5 mg/dL (ref 0.3–1.2)
Total Protein: 6.9 g/dL (ref 6.5–8.1)

## 2020-05-02 ENCOUNTER — Telehealth: Payer: Self-pay | Admitting: Nurse Practitioner

## 2020-05-02 NOTE — Telephone Encounter (Signed)
Scheduled appointments per 12/29 los. Spoke to patient who is aware of appointments date and times.

## 2020-05-09 ENCOUNTER — Encounter: Payer: Self-pay | Admitting: Nurse Practitioner

## 2020-05-09 ENCOUNTER — Ambulatory Visit (INDEPENDENT_AMBULATORY_CARE_PROVIDER_SITE_OTHER): Payer: Medicare PPO | Admitting: Family Medicine

## 2020-05-09 DIAGNOSIS — R300 Dysuria: Secondary | ICD-10-CM

## 2020-05-09 LAB — POCT URINALYSIS DIPSTICK
Bilirubin, UA: NEGATIVE
Blood, UA: NEGATIVE
Glucose, UA: NEGATIVE
Nitrite, UA: POSITIVE
Protein, UA: NEGATIVE
Spec Grav, UA: 1.015 (ref 1.010–1.025)
Urobilinogen, UA: 0.2 E.U./dL
pH, UA: 5.5 (ref 5.0–8.0)

## 2020-05-09 MED ORDER — NITROFURANTOIN MONOHYD MACRO 100 MG PO CAPS: 100.0000 mg | ORAL_CAPSULE | Freq: Two times a day (BID) | ORAL | 0 refills | Status: DC

## 2020-05-09 NOTE — Progress Notes (Signed)
Virtual Visit via Telephone Note  I connected with Adrienne [name] on 05/09/20 at 12:40 PM EST by telephone and verified that I am speaking with the correct person using two identifiers.   I discussed the limitations, risks, security and privacy concerns of performing an evaluation and management service by telephone and the availability of in person appointments. I also discussed with the patient that there may be a patient responsible charge related to this service. The patient expressed understanding and agreed to proceed.  Location patient: home, [place] Location provider: work or home office Participants present for the call: patient, provider Patient did not have a visit with me in the prior 7 days to address this/these issue(s).   History of Present Illness:  Acute telemedicine visit for "a UTI" : -Onset: 3-4 days ago -did urine dip at PCP office today and had nit and leuks -Symptoms include: frequency, urgency, burning with urinary -Denies: fevers, vomiting, nausea, malaise, hematuria,  -Has tried: azo -Pertinent past medical history: hx of UTI and reports feels the same -Pertinent medication allergies: sulfa, pencillins    Observations/Objective: Patient sounds cheerful and well on the phone. I do not appreciate any SOB. Speech and thought processing are grossly intact. Patient reported vitals:  Assessment and Plan:  Dysuria   -we discussed possible serious and likely etiologies, options for evaluation and workup, limitations of telemedicine visit vs in person visit, treatment, treatment risks and precautions. Pt prefers to treat via telemedicine empirically rather than in person at this moment.  Patient opted for empiric treatment with Macrobid 100 mg twice daily for 7 days for suspected simple cystitis.  Advised to seek prompt in person care if worsening, new symptoms arise, or if is not improving with treatment. Advised of options for inperson care in case PCP office not  available. Did let the patient know that I only do telemedicine shifts for [facility] on Tuesdays and Thursdays and advised a follow up visit with PCP or at an [facility] if has further questions or concerns.   Follow Up Instructions:  I did not refer this patient for an OV with me in the next 24 hours for this/these issue(s).  I discussed the assessment and treatment plan with the patient. The patient was provided an opportunity to ask questions and all were answered. The patient agreed with the plan and demonstrated an understanding of the instructions.   I spent 12 minutes on the date of this visit in the care of this patient. See summary of tasks completed to properly care for this patient in the detailed notes above which often include previsit review of recent office visit notes, review of PMH, medications, allergies, evaluation of the patient and ordering and instructing patient on testing and care options.     Terressa Koyanagi, DO

## 2020-05-09 NOTE — Patient Instructions (Signed)
-  I sent the medication(s) we discussed to your pharmacy: Meds ordered this encounter  Medications   nitrofurantoin, macrocrystal-monohydrate, (MACROBID) 100 MG capsule    Sig: Take 1 capsule (100 mg total) by mouth 2 (two) times daily.    Dispense:  14 capsule    Refill:  0     I hope you are feeling better soon!  Seek in person care promptly if your symptoms worsen, new concerns arise or you are not improving with treatment.  It was nice to meet you today. I help [facility] out with telemedicine visits on Tuesdays and Thursdays and am available for visits on those days. If you have any concerns or questions following this visit please schedule a follow up visit with your Primary Care doctor or seek care at a local urgent care clinic to avoid delays in care.   

## 2020-05-27 ENCOUNTER — Encounter: Payer: Self-pay | Admitting: Adult Health

## 2020-05-27 ENCOUNTER — Encounter: Payer: Self-pay | Admitting: Hematology

## 2020-05-27 LAB — HM MAMMOGRAPHY

## 2020-06-05 ENCOUNTER — Encounter: Payer: Self-pay | Admitting: Family Medicine

## 2020-07-31 ENCOUNTER — Other Ambulatory Visit: Payer: Self-pay | Admitting: Cardiology

## 2020-08-01 NOTE — Telephone Encounter (Signed)
26f, 58.4kg, scr 1.16(05/01/20), lovw/[place](01/29/20)

## 2020-08-22 ENCOUNTER — Other Ambulatory Visit: Payer: Self-pay | Admitting: Adult Health

## 2020-08-22 DIAGNOSIS — E139 Other specified diabetes mellitus without complications: Secondary | ICD-10-CM

## 2020-10-03 ENCOUNTER — Encounter: Payer: Self-pay | Admitting: Adult Health

## 2020-10-11 ENCOUNTER — Other Ambulatory Visit: Payer: Self-pay | Admitting: Adult Health

## 2020-10-11 DIAGNOSIS — M1A9XX0 Chronic gout, unspecified, without tophus (tophi): Secondary | ICD-10-CM

## 2020-10-28 ENCOUNTER — Other Ambulatory Visit: Payer: Self-pay | Admitting: Adult Health

## 2020-10-28 DIAGNOSIS — E139 Other specified diabetes mellitus without complications: Secondary | ICD-10-CM

## 2020-10-29 NOTE — Progress Notes (Signed)
[place]   [phone] [phone]   Clinic Follow up Note   Patient Care Team: [name], NP as PCP - General (Family Medicine) Stanford Breed Denice Bors, MD as PCP - Cardiology (Cardiology)  Date of Service:  10/30/2020  CHIEF COMPLAINT: Follow up for right breast cancer, triple negative   SUMMARY OF ONCOLOGIC HISTORY: Oncology History Overview Note  Cancer Staging Breast cancer of upper-outer quadrant of right female breast [facility]) Staging form: Breast, AJCC 8th Edition - Clinical stage from 09/22/2017: Stage IB (cT1b, cN0, cM0, G2, ER-, PR-, HER2-) - Signed by Truitt Merle, MD on 10/08/2017  Breast cancer, left breast [facility]) Staging form: Breast, AJCC 7th Edition - Clinical stage from 09/12/2012: Stage IA (T1b, N0, M0) - Unsigned      Breast cancer, left breast ([place])  08/25/2012 Receptors her2   ER 100% positive, PR 88% positive, HER-2 negative, Ki-67 16%    08/30/2012 Initial Diagnosis   Breast cancer, left breast    09/12/2012 Pathology Results   T1bN0 Grade 1 invasive ductal carcinoma, and DCIS.    09/12/2012 Surgery   Left breast lumpectomy and sentinel lymph node biopsy, negative margins.    11/09/2012 - 12/13/2012 Radiation Therapy   Adjuvant breast radiation    12/2012 - 11/2017 Anti-estrogen oral therapy   Anastrozole 1 mg daily, switched to Aromasin 25 mg daily in Jan 2015 due to diarrhea. Her Exemestane was switched to letrozole in 08/2017 due to high copay. She completed her 5 years in 11/2017.    09/01/2016 Imaging   [name] Outside films Breast form [facility] 09/01/2016 IMPRESSION: Probably Benign 1. No significant change in oval hypoechoic masses in the left breast at 11:00 2 cm from the nipple and 10:30 4 cm from the nipple. Both of these masses resemble the area of fat necrosis previously biopsies at 2:00. In addition, both masses have developing central benign or dystrophic appearing calcifications and are favored to be other areas of fat  necrosis. A 6 month follow-up left breast mammogram and ultrasound is recommended.    09/01/2016 Mammogram   [facility] Mammogram from [facility] 09/01/16 IMPRESSION  Incomplete - additional imaging evaluation needed Ultrasound of the upper medial left breast mass is recommended.     03/09/2017 Mammogram   Previous lumpectomy changes in the upper outer left breast anterior depth with stable associated dystrophic calcifications.  Biopsy clip at the 2:00 in the left breast near the lumpectomy site corresponding to previously biopsied benign fat necrosis.  Persistent round mass in the upper medial left breast with benign-appearing central round calcification.  No significant masses, calcifications, or other findings are seen in the breast  Impression: ultrasound is recommended for the left breast    03/09/2017 Breast US   Left breast ultrasound impression:  No significant change in oval hypoechoic masses in the left breast at 11:00 2 cm from the nipple and 10:30 4 cm from the nipple.  Both of these masses resemble the area of fat necrosis previously biopsied at 2:00.  In addition, both masses have developing central benign or dystrophic appearing calcifications and are favored to be other areas of fat necrosis.  A 14-month follow-up bilateral mammogram and left breast ultrasound is recommended.    09/14/2017 Breast US   Breast US Bilateral 09/14/17 at [facility]  IMPRESSION:  The 1 cm oval mass in the left breast at 9:30 posterior depth is highly suggestive of malignancy. An Ultrasound guided biopsy is recommended.  The 0.8 cm round mass in  the left breast at 11-12 o'clock anterior depth is suspicious of malignancy. An ultrasound guided biopsy is recommended.     09/22/2017 Pathology Results   Diagnosis 1. Breast, right, needle core biopsy - INVASIVE DUCTAL CARCINOMA, MSBR GRADE II/III. - DUCTAL CARCINOMA IN SITU WITH NECROSIS. 2. Breast, left, needle core biopsy - FAT NECROSIS. - NO EVIDENCE OF  MALIGNANCY.    10/17/2017 Genetic Testing   Negative genetic testing on the Multicancer panel.  The Multi-Gene Panel offered by [facility] includes sequencing and/or deletion duplication testing of the following 83 genes: ALK, APC, ATM, AXIN2,BAP1,  BARD1, BLM, BMPR1A, BRCA1, BRCA2, BRIP1, CASR, CDC73, CDH1, CDK4, CDKN1B, CDKN1C, CDKN2A (p14ARF), CDKN2A (p16INK4a), CEBPA, CHEK2, CTNNA1, DICER1, DIS3L2, EGFR (c.2369C>T, p.Thr790Met variant only), EPCAM (Deletion/duplication testing only), FH, FLCN, GATA2, GPC3, GREM1 (Promoter region deletion/duplication testing only), HOXB13 (c.251G>A, p.Gly84Glu), HRAS, KIT, MAX, MEN1, MET, MITF (c.952G>A, p.Glu318Lys variant only), MLH1, MSH2, MSH3, MSH6, MUTYH, NBN, NF1, NF2, NTHL1, PALB2, PDGFRA, PHOX2B, PMS2, POLD1, POLE, POT1, PRKAR1A, PTCH1, PTEN, RAD50, RAD51C, RAD51D, RB1, RECQL4, RET, RUNX1, SDHAF2, SDHA (sequence changes only), SDHB, SDHC, SDHD, SMAD4, SMARCA4, SMARCB1, SMARCE1, STK11, SUFU, TERT, TERT, TMEM127, TP53, TSC1, TSC2, VHL, WRN and WT1.  The report date is October 17, 2017.    Breast cancer of upper-outer quadrant of right female breast ([place])  09/14/2017 Mammogram   Diagnostic mammogram at [facility]  IMPRESSION:  The new 0.9 cm oval high density mass in the right breast posterior depth superior region seen on the mediolateral oblique view only is indeterminate. The 1.1 cm focal asymmetry in the left breast central to the nipple anterior depth is indeterminate.     09/14/2017 Imaging   [name] Bilateral at [facility] IMPRESSION: The 1 cm oval mass in the right breast at 9:30 posterior depth is highly suggestive of malignancy. The 0.8 cm round mass in the left breast at 11-12 o'clock anterior depth is suspicious of malignancy.    09/22/2017 Cancer Staging   Staging form: Breast, AJCC 8th Edition - Clinical stage from 09/22/2017: Stage IB (cT1b, cN0, cM0, G2, ER-, PR-, HER2-) - Signed by Truitt Merle, MD on 10/08/2017    09/22/2017 Pathology Results   Bilateral needle  core biopsy 1. Breast, right, needle core biopsy - INVASIVE DUCTAL CARCINOMA, MSBR GRADE II/III. - DUCTAL CARCINOMA IN SITU WITH NECROSIS. 2. Breast, left, needle core biopsy - FAT NECROSIS. - NO EVIDENCE OF MALIGNANCY.    09/25/2017 Receptors her2   Estrogen Receptor: 0 Progesterone 0 HER2: Negative. Ki-67: 80%     10/08/2017 Initial Diagnosis   Breast cancer of upper-outer quadrant of right female breast ([place])    10/26/2017 Surgery   BREAST LUMPECTOMY WITH RADIOACTIVE SEED AND SENTINEL LYMPH NODE BIOPSY by Dr. Excell Seltzer  10/26/17    10/26/2017 Pathology Results   Diagnosis 10/26/17 1. Breast, lumpectomy, Right - INVASIVE DUCTAL CARCINOMA, GRADE 2, SPANNING 1 CM. - HIGH GRADE DUCTAL CARCINOMA IN SITU WITH NECROSIS. - FINAL RESECTION MARGINS (PARTS #2, 3, 4) ARE NEGATIVE. - BIOPSY SITE. - SEE ONCOLOGY TABLE. 2. Breast, excision, additional anterior margin- 2 pieces right - BENIGN BREAST TISSUE. 3. Breast, excision, Right unoriented anterior margin - BENIGN BREAST TISSUE. 4. Breast, excision, Right deep margin - BENIGN BREAST TISSUE. 5. Lymph node, sentinel, biopsy, Right Axillary - ONE OF ONE LYMPH NODES NEGATIVE FOR CARCINOMA (0/1).    10/26/2017 Cancer Staging   Staging form: Breast, AJCC 8th Edition - Pathologic stage from 10/26/2017: Stage IB (pT1b, pN0, cM0, G2, ER-, PR-, HER2-) - Signed by  Truitt Merle, MD on 11/12/2017    12/03/2017 - 02/25/2018 Chemotherapy   Weekly Abraxane for 12 weeks 12/03/17-02/25/18   03/2018 - 04/19/2018 Radiation Therapy   With Dr. Isidore Moos       CURRENT THERAPY:  Surveillance  INTERVAL HISTORY: Adrienne [name] is here for a follow up of right breast cancer. She was last seen by me 1 year ago. She presents to the clinic alone. She continues to do her laundry and clean her area as needed, being very independent. The patient notes no issues with fatigue or her appetite, with her weight remaining steady. She denies any back or stomach pain. The  patient notes some muscular pain in her back due to her mattress. She continues to stay active by walking each morning.  The patient notes she is no longer on the Gabapentin and denies any neuropathy symptoms.   REVIEW OF SYSTEMS:  Constitutional: Denies fevers, chills or abnormal weight loss Eyes: Denies blurriness of vision Ears, nose, mouth, throat, and face: Denies mucositis or sore throat Respiratory: Denies cough, dyspnea or wheezes Cardiovascular: Denies palpitation, chest discomfort or lower extremity swelling Gastrointestinal:  Denies nausea, heartburn or change in bowel habits Skin: Denies abnormal skin rashes Lymphatics: Denies new lymphadenopathy or easy bruising Neurological:Denies numbness, tingling or new weaknesses Behavioral/Psych: Mood is stable, no new changes  All other systems were reviewed with the patient and are negative.  MEDICAL HISTORY:  Past Medical History:  Diagnosis Date   Allergy    Atrial fibrillation ([place])    Breast cancer ([place]) 08/25/12   Invasive ductal ca,DCIS   Diabetes mellitus    type II   Family history of cancer    Gout    Hearing loss    History of breast cancer    History of frequent urinary tract infections    History of radiation therapy 03/21/18- 04/19/18   Right Breast 2.67 Gy X 15 fractions, right breast boost 2 Gy X 5 fractions.    Hx of radiation therapy 11/22/12- 12/19/12   breast 4250 cGy 17 sessions, left breast boost 750 cGy 3 sessions   Hyperlipidemia    Hypertension    Osteopenia     SURGICAL HISTORY: Past Surgical History:  Procedure Laterality Date   ABDOMINAL HYSTERECTOMY Bilateral 1999   w/b/l salpingo-oopherectomy   APPENDECTOMY     BREAST LUMPECTOMY WITH NEEDLE LOCALIZATION AND AXILLARY SENTINEL LYMPH NODE BX Left 09/12/2012   Procedure: LEFT NEEDLE LOCALIZATION BREAST LUMPECTOMY TION AND AXILLARY SENTINEL LYMPH NODE BX;  Surgeon: Edward Jolly, MD;  Location: [place];  Service: General;  Laterality: Left;    BREAST LUMPECTOMY WITH RADIOACTIVE SEED AND SENTINEL LYMPH NODE BIOPSY Right 10/26/2017   Procedure: BREAST LUMPECTOMY WITH RADIOACTIVE SEED AND SENTINEL LYMPH NODE BIOPSY;  Surgeon: Excell Seltzer, MD;  Location: [place];  Service: General;  Laterality: Right;   BREAST SURGERY  1990's   right- fibroid cyst   CHOLECYSTECTOMY     SEPTOPLASTY     TONSILLECTOMY      I have reviewed the social history and family history with the patient and they are unchanged from previous note.  ALLERGIES:  is allergic to penicillins, other, prednisone, radiaplexrx [woun'dres hydrogel wound dress], and sulfamethoxazole.  MEDICATIONS:  Current Outpatient Medications  Medication Sig Dispense Refill   ELIQUIS 2.5 MG TABS tablet TAKE 1 TABLET BY MOUTH TWICE A DAY 180 tablet 1   Accu-Chek Softclix Lancets lancets Use as instructed 200 each 12   allopurinol (ZYLOPRIM)  300 MG tablet TAKE 1 TABLET BY MOUTH EVERY DAY 90 tablet 3   atenolol (TENORMIN) 25 MG tablet TAKE 1 TABLET BY MOUTH EVERY DAY 90 tablet 3   Blood Glucose Monitoring Suppl (BLOOD GLUCOSE MONITOR SYSTEM) w/Device KIT Accu Check Aviva Meter 1 kit 0   chlorpheniramine (CHLOR-TRIMETON) 4 MG tablet Take 4 mg 2 (two) times daily as needed by mouth for allergies.     diltiazem (CARDIZEM CD) 360 MG 24 hr capsule TAKE 1 CAPSULE BY MOUTH EVERY DAY 90 capsule 3   fish oil-omega-3 fatty acids 1000 MG capsule Take 1 g by mouth daily.     glucose blood (ACCU-CHEK AVIVA PLUS) test strip USE TO TEST TWICE DAILY **ICD-10 CODE E13.9** 200 each 3   metFORMIN (GLUCOPHAGE) 500 MG tablet TAKE 1 TABLET BY MOUTH TWICE A DAY 180 tablet 0   Multiple Vitamin (MULTIVITAMIN) tablet Take 1 tablet by mouth daily.     OVER THE COUNTER MEDICATION Take by mouth 2 (two) times daily. Presavision- for macular degeneration prevention     phenazopyridine (PYRIDIUM) 95 MG tablet Take 95 mg by mouth 3 (three) times daily as needed for pain. (Patient not taking:  Reported on 02/21/2020)     rosuvastatin (CRESTOR) 5 MG tablet TAKE 1 TABLET BY MOUTH EVERY DAY 90 tablet 1   VITAMIN D, ERGOCALCIFEROL, PO Take by mouth.     No current facility-administered medications for this visit.    PHYSICAL EXAMINATION: ECOG PERFORMANCE STATUS: 1 - Symptomatic but completely ambulatory  Vitals:   10/30/20 0855  BP: (!) 156/79  Pulse: 90  Resp: 17  Temp: 97.8 F (36.6 C)  SpO2: 96%   Filed Weights   10/30/20 0855  Weight: 129 lb 1.6 oz (58.6 kg)    GENERAL:alert, no distress and comfortable. SKIN: skin color, texture, turgor are normal, no rashes or significant lesions EYES: normal, Conjunctiva are pink and non-injected, sclera clear NECK: supple, thyroid normal size, non-tender, without nodularity LYMPH:  no palpable lymphadenopathy in the cervical, axillary  LUNGS: clear to auscultation and percussion with normal breathing effort HEART: regular rate & rhythm and no murmurs and no lower extremity edema NEURO: alert & oriented x 3 with fluent speech, no focal motor/sensory deficits Breasts: breasts appear normal, no suspicious masses, no skin or nipple changes or axillary nodes, surgical scars noted and scar tissue b/l. Nipple inversion that is not new b/l.    LABORATORY DATA:  I have reviewed the data as listed CBC Latest Ref Rng & Units 10/30/2020 05/01/2020 10/30/2019  WBC 4.0 - 10.5 K/uL 7.6 7.6 7.6  Hemoglobin 12.0 - 15.0 g/dL 13.2 12.7 14.1  Hematocrit 36.0 - 46.0 % 40.1 39.1 43.1  Platelets 150 - 400 K/uL 162 200 156     CMP Latest Ref Rng & Units 10/30/2020 05/01/2020 10/30/2019  Glucose 70 - 99 mg/dL 180(H) 169(H) 198(H)  BUN 8 - 23 mg/dL 19 23 40(H)  Creatinine 0.44 - 1.00 mg/dL 1.15(H) 1.16(H) 1.21(H)  Sodium 135 - 145 mmol/L 141 141 141  Potassium 3.5 - 5.1 mmol/L 3.7 3.8 4.1  Chloride 98 - 111 mmol/L 107 108 108  CO2 22 - 32 mmol/L 25 25 20(L)  Calcium 8.9 - 10.3 mg/dL 9.7 9.4 9.7  Total Protein 6.5 - 8.1 g/dL 6.7 6.9 7.0  Total  Bilirubin 0.3 - 1.2 mg/dL 0.7 0.5 0.4  Alkaline Phos 38 - 126 U/L 94 81 90  AST 15 - 41 U/L $Remo'27 19 21  'onBcX$ ALT 0 -  44 U/L $Remo'30 22 22      'upeWk$ RADIOGRAPHIC STUDIES: I have personally reviewed the radiological images as listed and agreed with the findings in the report. No results found.   ASSESSMENT & PLAN:  Adrienne [name] is a 86 y.o. female with   1. Breast cancer of upper outer quadrant of right breast, invasive ductal carcinoma, stage IB, pT1bN0M0, grade 2, Triple Negative, Ki67: 80% -She was diagnosed in 09/2017. She is s/p right lumpectomy with SLNB, 12 weeks of adjuvant Abraxane and adjuvant radiation.  -Her genetic testing was negative -Her 05/2019 Mammogram showed 1.1cm mass in her right breast. She had a right breast biopsy on 05/31/19 which showed benign fat necrosis. -She is clinically doing well.  Her physical exam today shows b/l scar tissue noted as same as last visit. There is no clinical concern for recurrence. -Advised pt that after five years, this type of breast cancer does not typically return. Will continue with six month f/u until year 5 year mark. -Continue Surveillance. -F/u in 6 months    2. pT1bN0, stage IA invasive ductal carcinoma of the left breast, grade I, ER 100%, PR 88%, Ki-67 16%, HER-2/neu negative. -She was diagnosed in 09/2012. She is s/p left lumpectomy with SLNB on 09/12/2012, s/p radiation therapy from 11/09/2012-12/19/2012. -She was on adjuvant Exemestane since 12/2012 which was switched to exemestane in 2019. She completed in 11/2017.  -She had a breast mammogram on 10/10/19 which was abnormal. Her left breast biopsy on 10/25/19 showed benign fat necrosis with calcifications.  -Her next mammogram is 07/27.   3. Osteopenia -03/2015 DEXA showed osteopenia, with 10 year probability of major ostial parotic fracture 25%, and hip fracture 15%. -Given the high risk of fracture, she would benefit from bisphosphonate or Prolia injection. She has not done Prolia injection  yet and she is hesitant to due to the concern of side effects. -She is on vitamin D supplement daily. No calcium given her hypercalcemia.  -Her 09/2017 DEXA from [facility] showed Osteopenia (-1.9 at right hip), very high risk of fracture, 29% for major osteoporotic fracture and 18% for hip fracture.    4. Hypercalcemia -She knows to drink adequately and avoid dehydration -She was previously instructed to restart Vit D in 03/2017 as well as continue light weight bearing exercise. -Ca normal lately.    5. Diabetes -She had elevated blood sugar when on steroids for hip pain, DC'd steroids  -She is currently on metformin.  -Strongly encouraged her to follow-up with PCP -The patient notes she intermittently checks her blood sugar levels at home.   6. Social Support -She lives at [facility] and is very independent -Her children live close by her    7. Sciatic nerve pain or Arthritis  -She has right lower back and hip pain that radiates down her leg. Onset 03/2019 and it has since resolved.   8. CKD, Stage III -Secondary to her DM    PLAN: -Reviewed pt labs today. The CBC is normal and the CMP is not concerning. Blood sugar was at 180 today. -Recommended pt check her fasting blood sugar levels and a few hours after dinner. -Recommended pt continue to stay active. -Will see back in six months with labs.   No problem-specific Assessment & Plan notes found for this encounter.   No orders of the defined types were placed in this encounter.  All questions were answered. The patient knows to call the clinic with any problems, questions or concerns. No  barriers to learning was detected.      Truitt Merle, MD 10/30/2020   I, Reinaldo Raddle, am acting as scribe for Dr. Truitt Merle, MD.

## 2020-10-30 ENCOUNTER — Encounter: Payer: Self-pay | Admitting: Hematology

## 2020-10-30 ENCOUNTER — Other Ambulatory Visit: Payer: Self-pay

## 2020-10-30 ENCOUNTER — Inpatient Hospital Stay: Payer: Medicare PPO | Attending: Hematology

## 2020-10-30 ENCOUNTER — Inpatient Hospital Stay: Payer: Medicare PPO | Admitting: Hematology

## 2020-10-30 VITALS — BP 156/79 | HR 90 | Temp 97.8°F | Resp 17 | Ht 61.0 in | Wt 129.1 lb

## 2020-10-30 DIAGNOSIS — E1122 Type 2 diabetes mellitus with diabetic chronic kidney disease: Secondary | ICD-10-CM | POA: Diagnosis not present

## 2020-10-30 DIAGNOSIS — C50411 Malignant neoplasm of upper-outer quadrant of right female breast: Secondary | ICD-10-CM

## 2020-10-30 DIAGNOSIS — M8580 Other specified disorders of bone density and structure, unspecified site: Secondary | ICD-10-CM

## 2020-10-30 DIAGNOSIS — Z853 Personal history of malignant neoplasm of breast: Secondary | ICD-10-CM | POA: Insufficient documentation

## 2020-10-30 DIAGNOSIS — C50412 Malignant neoplasm of upper-outer quadrant of left female breast: Secondary | ICD-10-CM

## 2020-10-30 DIAGNOSIS — Z923 Personal history of irradiation: Secondary | ICD-10-CM | POA: Diagnosis not present

## 2020-10-30 DIAGNOSIS — Z170 Estrogen receptor positive status [ER+]: Secondary | ICD-10-CM | POA: Diagnosis not present

## 2020-10-30 DIAGNOSIS — Z7984 Long term (current) use of oral hypoglycemic drugs: Secondary | ICD-10-CM | POA: Diagnosis not present

## 2020-10-30 DIAGNOSIS — N183 Chronic kidney disease, stage 3 unspecified: Secondary | ICD-10-CM | POA: Insufficient documentation

## 2020-10-30 DIAGNOSIS — Z171 Estrogen receptor negative status [ER-]: Secondary | ICD-10-CM | POA: Insufficient documentation

## 2020-10-30 LAB — CBC WITH DIFFERENTIAL (CANCER CENTER ONLY)
Abs Immature Granulocytes: 0.02 10*3/uL (ref 0.00–0.07)
Basophils Absolute: 0.1 10*3/uL (ref 0.0–0.1)
Basophils Relative: 1 %
Eosinophils Absolute: 0.1 10*3/uL (ref 0.0–0.5)
Eosinophils Relative: 2 %
HCT: 40.1 % (ref 36.0–46.0)
Hemoglobin: 13.2 g/dL (ref 12.0–15.0)
Immature Granulocytes: 0 %
Lymphocytes Relative: 18 %
Lymphs Abs: 1.4 10*3/uL (ref 0.7–4.0)
MCH: 31.7 pg (ref 26.0–34.0)
MCHC: 32.9 g/dL (ref 30.0–36.0)
MCV: 96.4 fL (ref 80.0–100.0)
Monocytes Absolute: 0.7 10*3/uL (ref 0.1–1.0)
Monocytes Relative: 9 %
Neutro Abs: 5.3 10*3/uL (ref 1.7–7.7)
Neutrophils Relative %: 70 %
Platelet Count: 162 10*3/uL (ref 150–400)
RBC: 4.16 MIL/uL (ref 3.87–5.11)
RDW: 14 % (ref 11.5–15.5)
WBC Count: 7.6 10*3/uL (ref 4.0–10.5)
nRBC: 0 % (ref 0.0–0.2)

## 2020-10-30 LAB — CMP (CANCER CENTER ONLY)
ALT: 30 U/L (ref 0–44)
AST: 27 U/L (ref 15–41)
Albumin: 3.6 g/dL (ref 3.5–5.0)
Alkaline Phosphatase: 94 U/L (ref 38–126)
Anion gap: 9 (ref 5–15)
BUN: 19 mg/dL (ref 8–23)
CO2: 25 mmol/L (ref 22–32)
Calcium: 9.7 mg/dL (ref 8.9–10.3)
Chloride: 107 mmol/L (ref 98–111)
Creatinine: 1.15 mg/dL — ABNORMAL HIGH (ref 0.44–1.00)
GFR, Estimated: 47 mL/min — ABNORMAL LOW (ref >60)
Glucose, Bld: 180 mg/dL — ABNORMAL HIGH (ref 70–99)
Potassium: 3.7 mmol/L (ref 3.5–5.1)
Sodium: 141 mmol/L (ref 135–145)
Total Bilirubin: 0.7 mg/dL (ref 0.3–1.2)
Total Protein: 6.7 g/dL (ref 6.5–8.1)

## 2020-11-04 DIAGNOSIS — R300 Dysuria: Secondary | ICD-10-CM | POA: Diagnosis not present

## 2020-11-04 DIAGNOSIS — N390 Urinary tract infection, site not specified: Secondary | ICD-10-CM | POA: Diagnosis not present

## 2020-11-04 DIAGNOSIS — R319 Hematuria, unspecified: Secondary | ICD-10-CM | POA: Diagnosis not present

## 2020-11-23 IMAGING — MR MR HIP*R* WO/W CM
7 of 8 series · 35 of 40 positions shown · IV contrast (13 ml multihance)
Comparison: Radiographs 03/27/2019

CLINICAL DATA: Right hip pain since March 2019. No specific
injury. History of breast cancer.

EXAM:
MRI OF THE RIGHT HIP WITHOUT AND WITH CONTRAST
TECHNIQUE: Multiplanar, multisequence MR imaging was performed both before and
after administration of intravenous contrast.
CONTRAST:  12mL MULTIHANCE GADOBENATE DIMEGLUMINE 529 MG/ML IV SOLN

[Series 3: T1 · coronal · 4.0mm · 1.19mm/px · 6 of 24 slices shown]
[im 1/24]
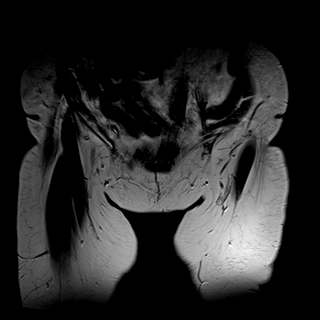
[im 5/24]
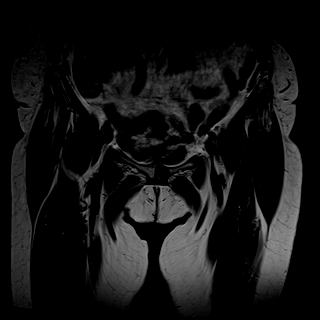
[im 10/24]
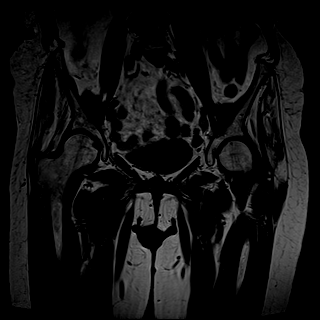
[im 14/24]
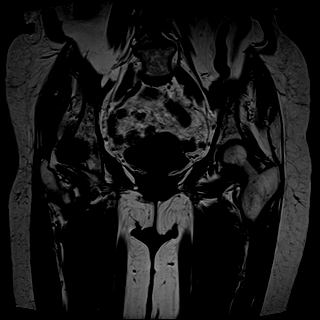
[im 19/24]
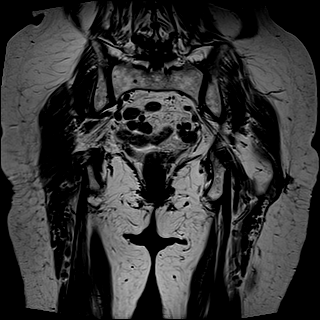
[im 24/24]
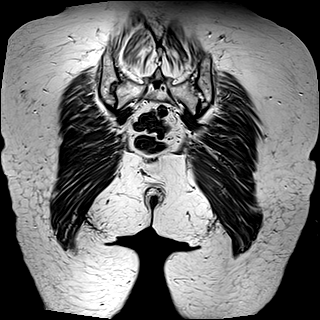

[Series 4: T2 fat-sat · coronal · 4.0mm · 1.19mm/px · 6 of 24 slices shown (1 of 2)]
[im 1/24]
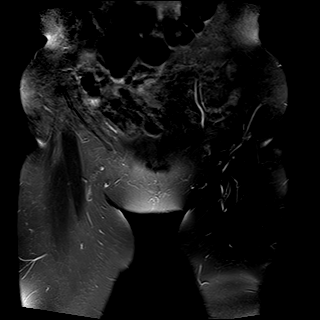
[im 5/24]
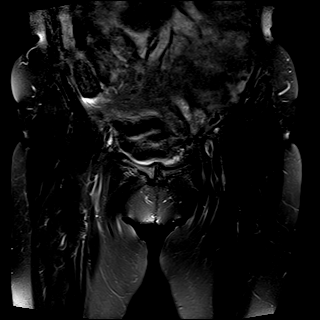
[im 10/24]
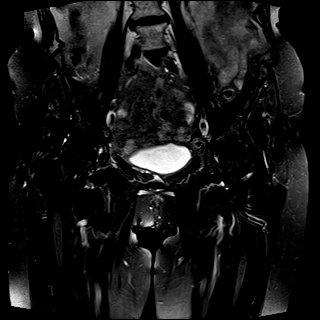
[im 14/24]
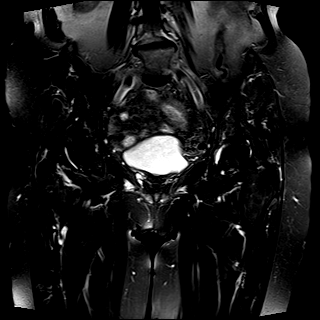
[im 19/24]
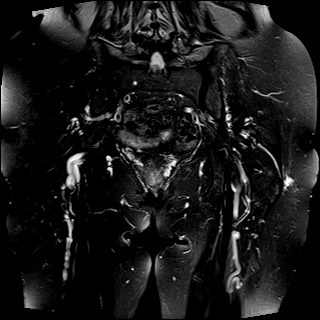
[im 24/24]
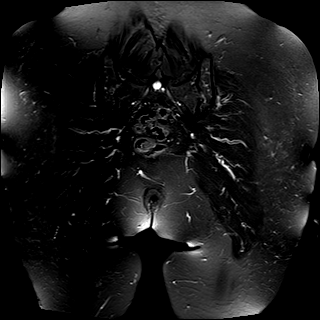

[Series 5: T2 fat-sat · axial · 4.0mm · 0.35mm/px · z∈[-33,+82]mm · 5 of 24 slices shown (2 of 2)]
[im 1/24]
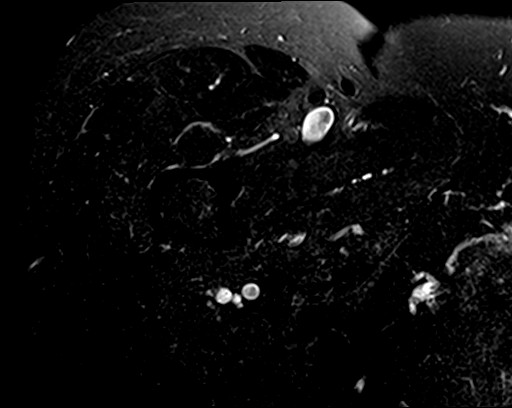
[im 6/24]
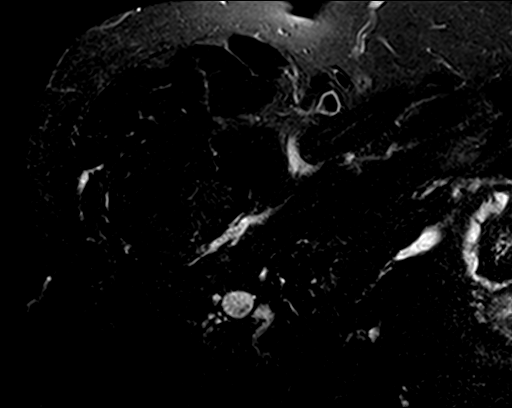
[im 12/24]
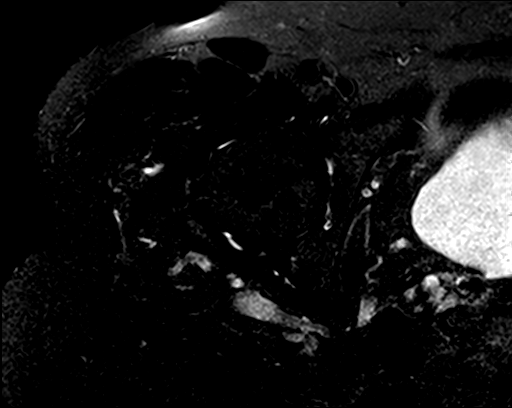
[im 18/24]
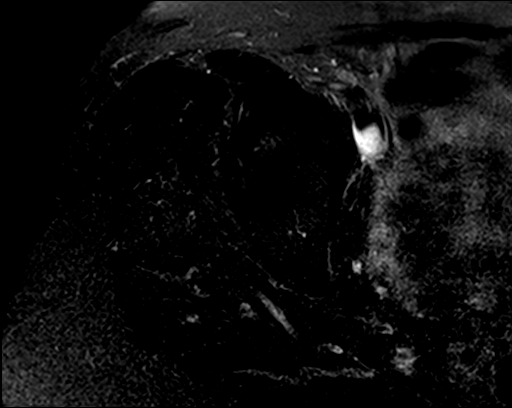
[im 24/24]
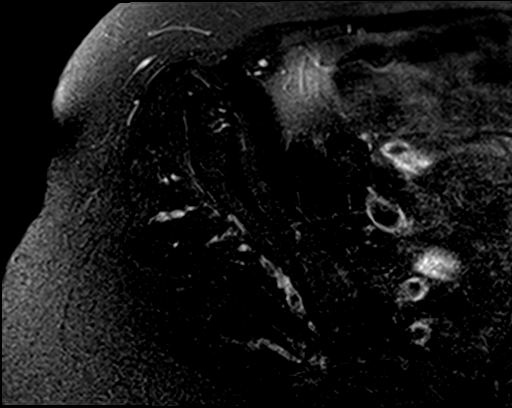

[Series 6: PD fat-sat · sagittal · 4.0mm · 0.70mm/px · 5 of 24 slices shown (1 of 2)]
[im 1/24]
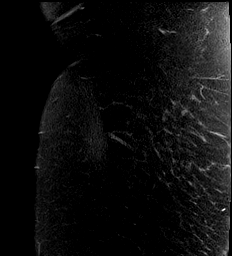
[im 6/24]
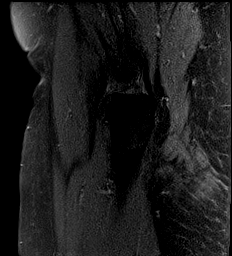
[im 12/24]
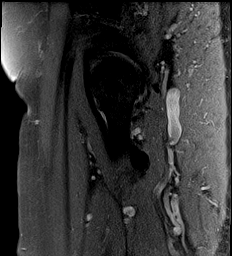
[im 18/24]
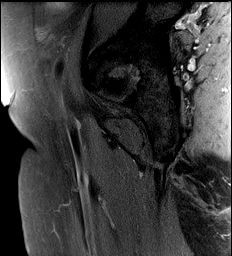
[im 24/24]
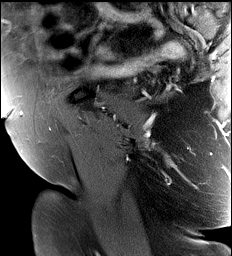

[Series 7: PD fat-sat · coronal · 4.0mm · 0.70mm/px · 4 of 19 slices shown (2 of 2)]
[im 1/19]
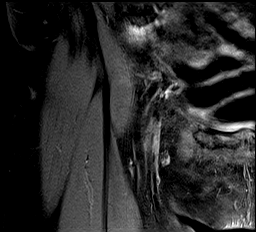
[im 7/19]
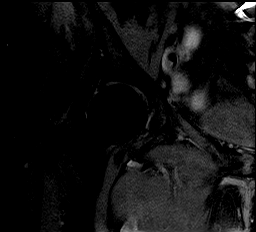
[im 13/19]
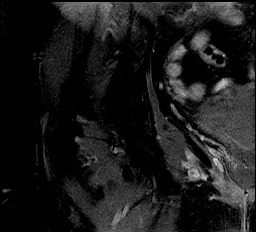
[im 19/19]
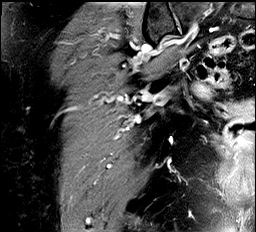

[Series 8: T1 fat-sat · axial · non-contrast · 4.0mm · 0.35mm/px · z∈[-38,+77]mm · 5 of 24 slices shown]
[im 1/24]
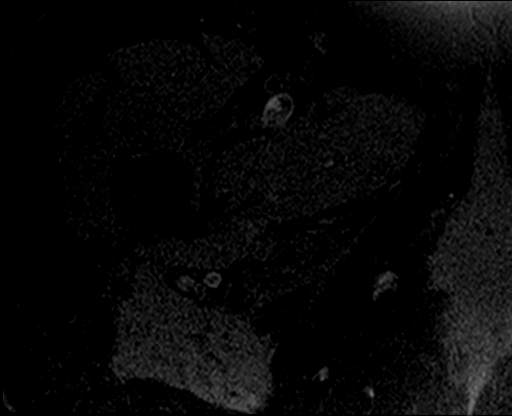
[im 6/24]
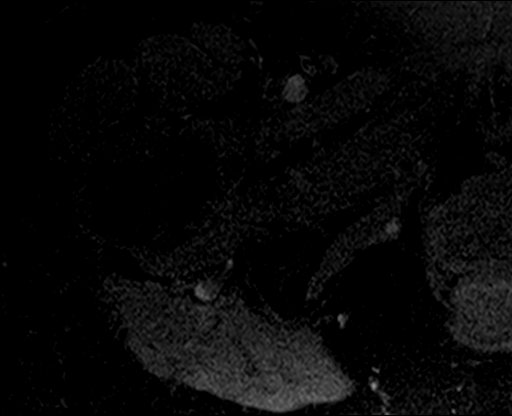
[im 12/24]
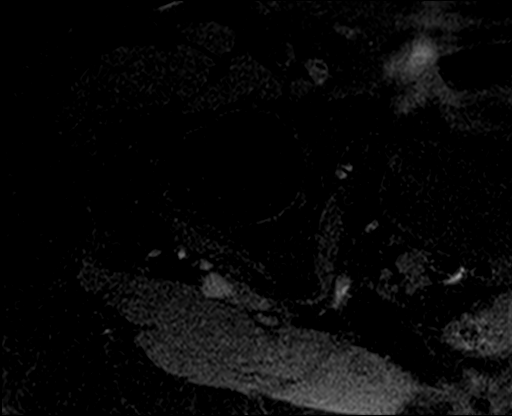
[im 18/24]
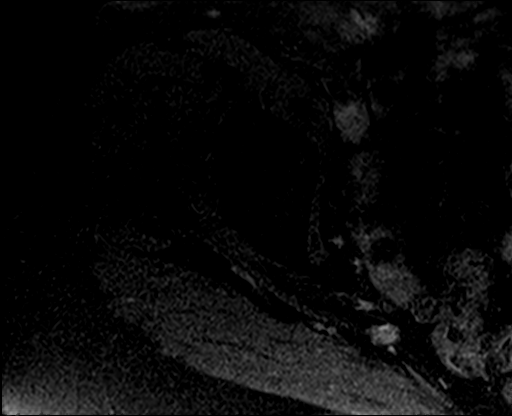
[im 24/24]
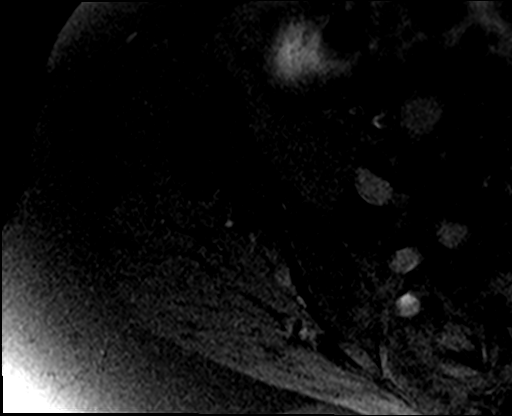

[Series 9: T1 fat-sat post-contrast · axial · 4.0mm · 0.35mm/px · z∈[-38,+47]mm · 4 of 24 slices shown]
[im 1/24]
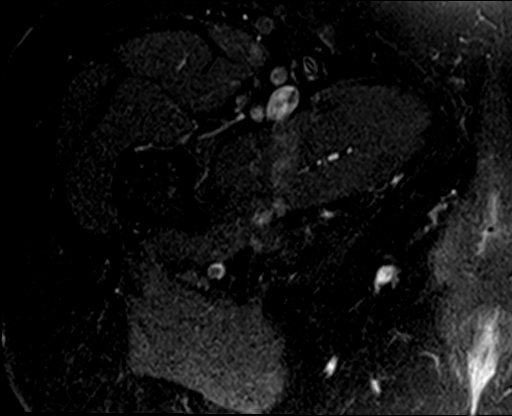
[im 6/24]
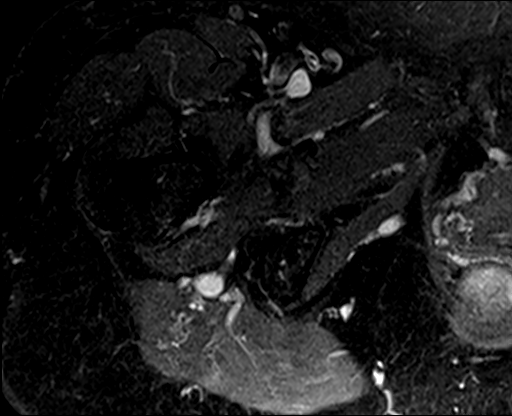
[im 12/24]
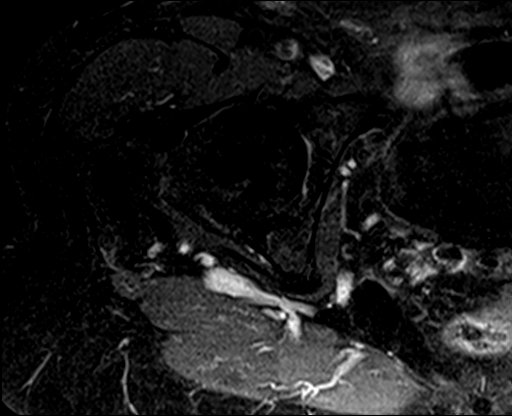
[im 18/24]
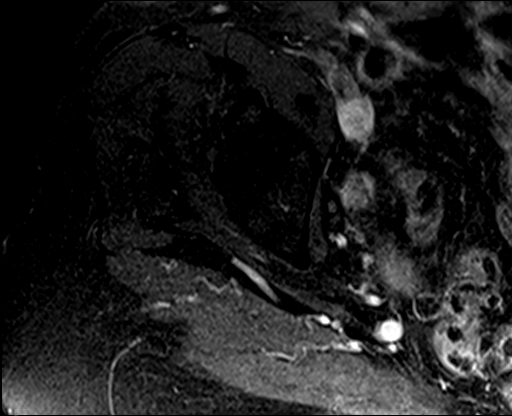

[35 of 40 positions shown; findings below may reference images not displayed]

FINDINGS: Bones: Both hips are normally located. No hip fracture or AVN.
Moderate degenerative changes bilaterally. No joint effusion or
periarticular fluid collections to suggest a paralabral cyst.
Moderate bilateral peritendinitis and mild trochanteric bursitis.

Degenerative changes noted the pubic symphysis. Ill-defined low T1
signal intensity in the left pubic bone could be degenerative or
sclerotic change related to treated metastatic focus. This is not
covered on the postcontrast images. No pubic rami fractures. The
bony pelvis is otherwise intact. No bone lesions or fractures.

Age related fatty atrophy involving the paraspinal and parapelvic
musculature but no muscle lesions or muscle tear. The hamstring
tendons are intact.

No significant intrapelvic abnormalities are identified.
IMPRESSION: 1. Moderate bilateral hip joint degenerative changes but no hip
fracture or AVN.
2. Moderate bilateral peritendinitis and mild trochanteric bursitis.
3. No significant intrapelvic abnormalities.
4. Ill-defined low T1 signal intensity in the left pubic bone could
be degenerative or sclerotic change related to treated metastatic
focus. Bony pelvic CT scan may be helpful for further evaluation.

## 2020-11-27 DIAGNOSIS — N6082 Other benign mammary dysplasias of left breast: Secondary | ICD-10-CM | POA: Diagnosis not present

## 2020-11-27 LAB — HM MAMMOGRAPHY

## 2020-12-03 ENCOUNTER — Encounter: Payer: Self-pay | Admitting: Adult Health

## 2020-12-25 DIAGNOSIS — D225 Melanocytic nevi of trunk: Secondary | ICD-10-CM | POA: Diagnosis not present

## 2020-12-25 DIAGNOSIS — L570 Actinic keratosis: Secondary | ICD-10-CM | POA: Diagnosis not present

## 2020-12-25 DIAGNOSIS — Q828 Other specified congenital malformations of skin: Secondary | ICD-10-CM | POA: Diagnosis not present

## 2020-12-25 DIAGNOSIS — D485 Neoplasm of uncertain behavior of skin: Secondary | ICD-10-CM | POA: Diagnosis not present

## 2020-12-25 DIAGNOSIS — C44719 Basal cell carcinoma of skin of left lower limb, including hip: Secondary | ICD-10-CM | POA: Diagnosis not present

## 2020-12-25 DIAGNOSIS — D2271 Melanocytic nevi of right lower limb, including hip: Secondary | ICD-10-CM | POA: Diagnosis not present

## 2020-12-25 DIAGNOSIS — C44219 Basal cell carcinoma of skin of left ear and external auricular canal: Secondary | ICD-10-CM | POA: Diagnosis not present

## 2020-12-25 DIAGNOSIS — L578 Other skin changes due to chronic exposure to nonionizing radiation: Secondary | ICD-10-CM | POA: Diagnosis not present

## 2020-12-25 DIAGNOSIS — L821 Other seborrheic keratosis: Secondary | ICD-10-CM | POA: Diagnosis not present

## 2021-01-07 ENCOUNTER — Other Ambulatory Visit: Payer: Self-pay | Admitting: Cardiology

## 2021-01-07 ENCOUNTER — Other Ambulatory Visit: Payer: Self-pay | Admitting: Adult Health

## 2021-01-09 DIAGNOSIS — M545 Low back pain, unspecified: Secondary | ICD-10-CM | POA: Diagnosis not present

## 2021-01-09 DIAGNOSIS — M1990 Unspecified osteoarthritis, unspecified site: Secondary | ICD-10-CM | POA: Diagnosis not present

## 2021-01-09 DIAGNOSIS — I10 Essential (primary) hypertension: Secondary | ICD-10-CM | POA: Diagnosis not present

## 2021-01-09 DIAGNOSIS — J302 Other seasonal allergic rhinitis: Secondary | ICD-10-CM | POA: Diagnosis not present

## 2021-01-09 DIAGNOSIS — G8929 Other chronic pain: Secondary | ICD-10-CM | POA: Diagnosis not present

## 2021-01-09 DIAGNOSIS — M109 Gout, unspecified: Secondary | ICD-10-CM | POA: Diagnosis not present

## 2021-01-09 DIAGNOSIS — D6869 Other thrombophilia: Secondary | ICD-10-CM | POA: Diagnosis not present

## 2021-01-09 DIAGNOSIS — E119 Type 2 diabetes mellitus without complications: Secondary | ICD-10-CM | POA: Diagnosis not present

## 2021-01-09 DIAGNOSIS — I4891 Unspecified atrial fibrillation: Secondary | ICD-10-CM | POA: Diagnosis not present

## 2021-01-25 ENCOUNTER — Other Ambulatory Visit: Payer: Self-pay

## 2021-01-25 ENCOUNTER — Emergency Department (HOSPITAL_BASED_OUTPATIENT_CLINIC_OR_DEPARTMENT_OTHER)
Admission: EM | Admit: 2021-01-25 | Discharge: 2021-01-25 | Disposition: A | Discharge status: Home / Self Care | Payer: Medicare PPO | Attending: Emergency Medicine | Admitting: Emergency Medicine

## 2021-01-25 DIAGNOSIS — Z79899 Other long term (current) drug therapy: Secondary | ICD-10-CM | POA: Insufficient documentation

## 2021-01-25 DIAGNOSIS — Z853 Personal history of malignant neoplasm of breast: Secondary | ICD-10-CM | POA: Diagnosis not present

## 2021-01-25 DIAGNOSIS — N3000 Acute cystitis without hematuria: Secondary | ICD-10-CM | POA: Diagnosis not present

## 2021-01-25 DIAGNOSIS — Z7984 Long term (current) use of oral hypoglycemic drugs: Secondary | ICD-10-CM | POA: Insufficient documentation

## 2021-01-25 DIAGNOSIS — E119 Type 2 diabetes mellitus without complications: Secondary | ICD-10-CM | POA: Diagnosis not present

## 2021-01-25 DIAGNOSIS — Z87891 Personal history of nicotine dependence: Secondary | ICD-10-CM | POA: Insufficient documentation

## 2021-01-25 DIAGNOSIS — B9689 Other specified bacterial agents as the cause of diseases classified elsewhere: Secondary | ICD-10-CM | POA: Insufficient documentation

## 2021-01-25 DIAGNOSIS — R1030 Lower abdominal pain, unspecified: Secondary | ICD-10-CM | POA: Diagnosis present

## 2021-01-25 DIAGNOSIS — I10 Essential (primary) hypertension: Secondary | ICD-10-CM | POA: Insufficient documentation

## 2021-01-25 DIAGNOSIS — Z7901 Long term (current) use of anticoagulants: Secondary | ICD-10-CM | POA: Diagnosis not present

## 2021-01-25 LAB — URINALYSIS, ROUTINE W REFLEX MICROSCOPIC
Bilirubin Urine: NEGATIVE
Glucose, UA: NEGATIVE mg/dL
Nitrite: POSITIVE — AB
Protein, ur: >300 mg/dL — AB
RBC / HPF: >50 RBC/hpf — ABNORMAL HIGH (ref 0–5)
Specific Gravity, Urine: 1.017 (ref 1.005–1.030)
WBC, UA: >50 WBC/hpf — ABNORMAL HIGH (ref 0–5)
pH: 6.5 (ref 5.0–8.0)

## 2021-01-25 MED ORDER — NITROFURANTOIN MONOHYD MACRO 100 MG PO CAPS: 100.0000 mg | ORAL_CAPSULE | Freq: Two times a day (BID) | ORAL | 0 refills | Status: DC

## 2021-01-25 MED ORDER — NITROFURANTOIN MONOHYD MACRO 100 MG PO CAPS
100.0000 mg | ORAL_CAPSULE | Freq: Once | ORAL | Status: AC
  Administered 2021-01-25: 100 mg via ORAL
  Filled 2021-01-25: qty 1

## 2021-01-25 NOTE — ED Triage Notes (Signed)
Presents today with having a lot of pressure at bladder area, having urinary frequency and burning with voiding.

## 2021-01-25 NOTE — ED Provider Notes (Signed)
[facility] Provider Note   CSN: [id number] Arrival date & time: 01/25/21  1848     History Chief Complaint  Patient presents with   Urinary Tract Infection    Adrienne [name] is a 86 y.o. female.   Urinary Tract Infection Associated symptoms: abdominal pain   Associated symptoms: no fever   Patient presents with dysuria.  Lower abdominal pain.  States feels like previous urinary tract infections.  Began earlier today.  Otherwise feeling fine.  No nausea or vomiting.  No diarrhea.  It is now September and states last UTI was in July.  No fevers.  No flank pain.    Past Medical History:  Diagnosis Date   Allergy    Atrial fibrillation ([place])    Breast cancer ([place]) 08/25/12   Invasive ductal ca,DCIS   Diabetes mellitus    type II   Family history of cancer    Gout    Hearing loss    History of breast cancer    History of frequent urinary tract infections    History of radiation therapy 03/21/18- 04/19/18   Right Breast 2.67 Gy X 15 fractions, right breast boost 2 Gy X 5 fractions.    Hx of radiation therapy 11/22/12- 12/19/12   breast 4250 cGy 17 sessions, left breast boost 750 cGy 3 sessions   Hyperlipidemia    Hypertension    Osteopenia     Patient Active Problem List   Diagnosis Date Noted   Port-A-Cath in place 01/28/2018   Genetic testing 10/18/2017   Family history of cancer    History of breast cancer    Breast cancer of upper-outer quadrant of right female breast ([place]) 10/08/2017   Osteopenia 09/21/2015   Rapid heart rate 04/01/2015   Atrial fibrillation ([place]) 04/01/2015   Diabetes 1.5, managed as type 2 ([place]) 09/04/2014   Breast cancer, left breast ([place]) 08/30/2012   Influenza 08/04/2011   Gout 06/01/2011   Vaginitis, atrophic 06/01/2011   RENAL INSUFFICIENCY, ACUTE 05/28/2010   HEMATURIA, HX OF 05/28/2010   Disorder of bone and cartilage 12/08/2007   Hyperlipidemia 08/10/2007   Essential hypertension 08/10/2007     Past Surgical History:  Procedure Laterality Date   ABDOMINAL HYSTERECTOMY Bilateral 1999   w/b/l salpingo-oopherectomy   APPENDECTOMY     BREAST LUMPECTOMY WITH NEEDLE LOCALIZATION AND AXILLARY SENTINEL LYMPH NODE BX Left 09/12/2012   Procedure: LEFT NEEDLE LOCALIZATION BREAST LUMPECTOMY TION AND AXILLARY SENTINEL LYMPH NODE [place];  Surgeon: Edward Jolly, MD;  Location: [facility];  Service: General;  Laterality: Left;   BREAST LUMPECTOMY WITH RADIOACTIVE SEED AND SENTINEL LYMPH NODE BIOPSY Right 10/26/2017   Procedure: BREAST LUMPECTOMY WITH RADIOACTIVE SEED AND SENTINEL LYMPH NODE BIOPSY;  Surgeon: Excell Seltzer, MD;  Location: [facility];  Service: General;  Laterality: Right;   BREAST SURGERY  1990's   right- fibroid cyst   CHOLECYSTECTOMY     SEPTOPLASTY     TONSILLECTOMY       OB History   No obstetric history on file.     Family History  Problem Relation Age of Onset   Hypertension Mother    Heart disease Mother        Murmur and irregular heart disease   Dementia Mother        d. 75   Hypertension Father    Aneurysm Father 58   Breast cancer Cousin 16       mat first cousin    Social  History   Tobacco Use   Smoking status: Former    Packs/day: 0.25    Years: 4.00    Pack years: 1.00    Types: Cigarettes   Smokeless tobacco: Never   Tobacco comments:    Cigarette use was 50 years ago  Vaping Use   Vaping Use: Never used  Substance Use Topics   Alcohol use: Not Currently   Drug use: No    Home Medications Prior to Admission medications   Medication Sig Start Date End Date Taking? Authorizing Provider  ELIQUIS 2.5 MG TABS tablet TAKE 1 TABLET BY MOUTH TWICE A DAY 08/01/20   Lelon Perla, MD  nitrofurantoin, macrocrystal-monohydrate, (MACROBID) 100 MG capsule Take 1 capsule (100 mg total) by mouth 2 (two) times daily. 01/25/21  Yes Davonna Belling, MD  Accu-Chek Softclix Lancets lancets Use as instructed 10/31/19   Nafziger,  Tommi Rumps, NP  allopurinol (ZYLOPRIM) 300 MG tablet TAKE 1 TABLET BY MOUTH EVERY DAY 10/11/20   Nafziger, Tommi Rumps, NP  atenolol (TENORMIN) 25 MG tablet TAKE 1 TABLET BY MOUTH EVERY DAY 01/29/20   Lelon Perla, MD  Blood Glucose Monitoring Suppl (BLOOD GLUCOSE MONITOR SYSTEM) w/Device KIT Accu Check Aviva Meter 12/29/18   Nafziger, Tommi Rumps, NP  chlorpheniramine (CHLOR-TRIMETON) 4 MG tablet Take 4 mg 2 (two) times daily as needed by mouth for allergies.    [provider]  diltiazem (CARDIZEM CD) 360 MG 24 hr capsule TAKE 1 CAPSULE BY MOUTH EVERY DAY 01/07/21   Lelon Perla, MD  fish oil-omega-3 fatty acids 1000 MG capsule Take 1 g by mouth daily.    [provider]  glucose blood (ACCU-CHEK AVIVA PLUS) test strip USE TO TEST TWICE DAILY **ICD-10 CODE E13.9** 10/31/19   Nafziger, Tommi Rumps, NP  metFORMIN (GLUCOPHAGE) 500 MG tablet TAKE 1 TABLET BY MOUTH TWICE A DAY 10/28/20   Nafziger, Tommi Rumps, NP  Multiple Vitamin (MULTIVITAMIN) tablet Take 1 tablet by mouth daily.    [provider]  OVER THE COUNTER MEDICATION Take by mouth 2 (two) times daily. Presavision- for macular degeneration prevention    [provider]  phenazopyridine (PYRIDIUM) 95 MG tablet Take 95 mg by mouth 3 (three) times daily as needed for pain. Patient not taking: Reported on 02/21/2020    [provider]  rosuvastatin (CRESTOR) 5 MG tablet TAKE 1 TABLET BY MOUTH EVERY DAY 01/08/21   Nafziger, Tommi Rumps, NP  VITAMIN D, ERGOCALCIFEROL, PO Take by mouth.    [provider]    Allergies    Penicillins, Other, Prednisone, Radiaplexrx [woun'dres hydrogel wound dress], and Sulfamethoxazole  Review of Systems   Review of Systems  Constitutional:  Negative for appetite change and fever.  HENT:  Negative for congestion.   Cardiovascular:  Negative for chest pain.  Gastrointestinal:  Positive for abdominal pain.  Genitourinary:  Positive for dysuria and frequency.  Musculoskeletal:  Negative for back  pain.  Skin:  Negative for rash.  Neurological:  Negative for weakness.  Psychiatric/Behavioral:  Negative for confusion.    Physical Exam Updated Vital Signs BP (!) 141/92 (BP Location: Right Arm)   Pulse 77   Temp 97.6 F (36.4 C)   Resp 18   Ht $R'5\' 1"'Fl$  (1.549 m)   Wt 57.6 kg   SpO2 94%   BMI 24.00 kg/m   Physical Exam Vitals and nursing note reviewed.  HENT:     Head: Atraumatic.  Eyes:     Pupils: Pupils are equal, round, and reactive to  light.  Cardiovascular:     Rate and Rhythm: Regular rhythm.  Abdominal:     Tenderness: There is no abdominal tenderness.  Genitourinary:    Comments: No CVA tenderness Musculoskeletal:        General: No tenderness.     Cervical back: Neck supple.  Skin:    General: Skin is warm.  Neurological:     Mental Status: She is alert. Mental status is at baseline.  Psychiatric:        Mood and Affect: Mood normal.    ED Results / Procedures / Treatments   Labs (all labs ordered are listed, but only abnormal results are displayed) Labs Reviewed  URINALYSIS, ROUTINE W REFLEX MICROSCOPIC - Abnormal; Notable for the following components:      Result Value   APPearance CLOUDY (*)    Hgb urine dipstick LARGE (*)    Ketones, ur TRACE (*)    Protein, ur >300 (*)    Nitrite POSITIVE (*)    Leukocytes,Ua LARGE (*)    RBC / HPF >50 (*)    WBC, UA >50 (*)    Bacteria, UA FEW (*)    Non Squamous Epithelial 0-5 (*)    All other components within normal limits  URINE CULTURE    EKG None  Radiology No results found.  Procedures Procedures   Medications Ordered in ED Medications  nitrofurantoin (macrocrystal-monohydrate) (MACROBID) capsule 100 mg (100 mg Oral Given 01/25/21 2036)    ED Course  I have reviewed the triage vital signs and the nursing notes.  Pertinent labs & imaging results that were available during my care of the patient were reviewed by me and considered in my medical decision making (see chart for  details).    MDM Rules/Calculators/A&P                          Patient with dysuria.  History of UTI.  States this feels the same.  Well-appearing.  Has had good renal function previously.  Discussed with patient and reviewed records it appears as if she will usually get nitrofurantoin for flares.  Will prescribe this.  Culture sent and patient be notified if does not match up.  Doubt other cause of the pain and dysuria such as diverticulitis.  Discharge home Final Clinical Impression(s) / ED Diagnoses Final diagnoses:  Acute cystitis without hematuria    Rx / DC Orders ED Discharge Orders          Ordered    nitrofurantoin, macrocrystal-monohydrate, (MACROBID) 100 MG capsule  2 times daily        01/25/21 2033             Davonna Belling, MD 01/25/21 [phone]

## 2021-01-25 NOTE — Discharge Instructions (Addendum)
Urine culture has been sent.  You will be notified if the bacteria does not match up with the antibiotic you were given

## 2021-01-25 NOTE — ED Notes (Addendum)
Assisted patient to the restroom. Pt unable to void at this time. Will attempt again when patient is ready. Educated on instruction of urinal hat and obtaining sample.

## 2021-01-28 LAB — URINE CULTURE: Culture: >=100000 — AB

## 2021-01-29 ENCOUNTER — Telehealth: Payer: Self-pay

## 2021-01-29 NOTE — Telephone Encounter (Signed)
Post ED Visit - Positive Culture Follow-up  Culture report reviewed by antimicrobial stewardship pharmacist: [facility] Team [x]  Joetta Manners, Pharm.D. BCCCP []  Heide Guile, Pharm.D., BCPS AQ-ID []  Parks Neptune, Pharm.D., BCPS []  Alycia Rossetti, Pharm.D., BCPS []  [place], Pharm.D., BCPS, AAHIVP []  Legrand Como, Pharm.D., BCPS, AAHIVP []  Salome Arnt, PharmD, BCPS []  Johnnette Gourd, PharmD, BCPS []  Hughes Better, PharmD, BCPS []  Leeroy Cha, PharmD []  Laqueta Linden, PharmD, BCPS []  Albertina Parr, PharmD  [facility] Team []  Leodis Sias, PharmD []  Lindell Spar, PharmD []  Royetta Asal, PharmD []  Graylin Shiver, Rph []  Rema Fendt) Glennon Mac, PharmD []  Arlyn Dunning, PharmD []  Netta Cedars, PharmD []  Dia Sitter, PharmD []  Leone Haven, PharmD []  Gretta Arab, PharmD []  Theodis Shove, PharmD []  Peggyann Juba, PharmD []  Reuel Boom, PharmD   Positive urine culture Treated with Nitrofurantoin Monohyd Macro, organism sensitive to the same and no further patient follow-up is required at this time.  Glennon Hamilton 01/29/2021, 9:13 AM

## 2021-01-30 NOTE — Progress Notes (Signed)
HPI: FU atrial fibrillation. Holter repeated September 2017 and showed rate was controlled.  Echocardiogram October 2021 showed normal LV function, mild left ventricular hypertrophy, probable PFO.  Since last seen, she denies dyspnea, chest pain, palpitations or syncope.  No bleeding.  She has not fallen.  Current Outpatient Medications  Medication Sig Dispense Refill   Accu-Chek Softclix Lancets lancets Use as instructed 200 each 12   allopurinol (ZYLOPRIM) 300 MG tablet TAKE 1 TABLET BY MOUTH EVERY DAY 90 tablet 3   atenolol (TENORMIN) 25 MG tablet TAKE 1 TABLET BY MOUTH EVERY DAY 90 tablet 3   Blood Glucose Monitoring Suppl (BLOOD GLUCOSE MONITOR SYSTEM) w/Device KIT Accu Check Aviva Meter 1 kit 0   chlorpheniramine (CHLOR-TRIMETON) 4 MG tablet Take 4 mg 2 (two) times daily as needed by mouth for allergies.     diltiazem (CARDIZEM CD) 360 MG 24 hr capsule TAKE 1 CAPSULE BY MOUTH EVERY DAY 90 capsule 3   ELIQUIS 2.5 MG TABS tablet TAKE 1 TABLET BY MOUTH TWICE A DAY 180 tablet 1   fish oil-omega-3 fatty acids 1000 MG capsule Take 1 g by mouth daily.     glucose blood (ACCU-CHEK AVIVA PLUS) test strip USE TO TEST TWICE DAILY **ICD-10 CODE E13.9** 200 each 3   metFORMIN (GLUCOPHAGE) 500 MG tablet TAKE 1 TABLET BY MOUTH TWICE A DAY 180 tablet 0   Multiple Vitamin (MULTIVITAMIN) tablet Take 1 tablet by mouth daily.     nitrofurantoin, macrocrystal-monohydrate, (MACROBID) 100 MG capsule Take 1 capsule (100 mg total) by mouth 2 (two) times daily. 13 capsule 0   OVER THE COUNTER MEDICATION Take by mouth 2 (two) times daily. Presavision- for macular degeneration prevention     rosuvastatin (CRESTOR) 5 MG tablet TAKE 1 TABLET BY MOUTH EVERY DAY 90 tablet 1   VITAMIN D, ERGOCALCIFEROL, PO Take by mouth.     phenazopyridine (PYRIDIUM) 95 MG tablet Take 95 mg by mouth 3 (three) times daily as needed for pain. (Patient not taking: No sig reported)     No current facility-administered medications  for this visit.     Past Medical History:  Diagnosis Date   Allergy    Atrial fibrillation ([place])    Breast cancer ([place]) 08/25/12   Invasive ductal ca,DCIS   Diabetes mellitus    type II   Family history of cancer    Gout    Hearing loss    History of breast cancer    History of frequent urinary tract infections    History of radiation therapy 03/21/18- 04/19/18   Right Breast 2.67 Gy X 15 fractions, right breast boost 2 Gy X 5 fractions.    Hx of radiation therapy 11/22/12- 12/19/12   breast 4250 cGy 17 sessions, left breast boost 750 cGy 3 sessions   Hyperlipidemia    Hypertension    Osteopenia     Past Surgical History:  Procedure Laterality Date   ABDOMINAL HYSTERECTOMY Bilateral 1999   w/b/l salpingo-oopherectomy   APPENDECTOMY     BREAST LUMPECTOMY WITH NEEDLE LOCALIZATION AND AXILLARY SENTINEL LYMPH NODE BX Left 09/12/2012   Procedure: LEFT NEEDLE LOCALIZATION BREAST LUMPECTOMY TION AND AXILLARY SENTINEL LYMPH NODE [place];  Surgeon: Edward Jolly, MD;  Location: [facility];  Service: General;  Laterality: Left;   BREAST LUMPECTOMY WITH RADIOACTIVE SEED AND SENTINEL LYMPH NODE BIOPSY Right 10/26/2017   Procedure: BREAST LUMPECTOMY WITH RADIOACTIVE SEED AND SENTINEL LYMPH NODE BIOPSY;  Surgeon: Excell Seltzer, MD;  Location:  [facility];  Service: General;  Laterality: Right;   BREAST SURGERY  1990's   right- fibroid cyst   CHOLECYSTECTOMY     SEPTOPLASTY     TONSILLECTOMY      Social History   Socioeconomic History   Marital status: Married    Spouse name: Not on file   Number of children: 3   Years of education: Not on file   Highest education level: Not on file  Occupational History   Not on file  Tobacco Use   Smoking status: Former    Packs/day: 0.25    Years: 4.00    Pack years: 1.00    Types: Cigarettes   Smokeless tobacco: Never   Tobacco comments:    Cigarette use was 50 years ago  Vaping Use   Vaping Use: Never used  Substance and  Sexual Activity   Alcohol use: Not Currently   Drug use: No   Sexual activity: Never    Comment: menarche age 65, 29st birth age 23, G54, HRT x 7 years?  Other Topics Concern   Not on file  Social History Narrative   Married - husband in memory care unit    Three children ( One lives at [place] and two in [place]      She likes to read and go to [employer]    Social Determinants of Health   Financial Resource Strain: Low Risk    Difficulty of Paying Living Expenses: Not hard at all  Food Insecurity: No Food Insecurity   Worried About Charity fundraiser in the Last Year: Never true   Arboriculturist in the Last Year: Never true  Transportation Needs: No Transportation Needs   Lack of Transportation (Medical): No   Lack of Transportation (Non-Medical): No  Physical Activity: Sufficiently Active   Days of Exercise per Week: 7 days   Minutes of Exercise per Session: 30 min  Stress: No Stress Concern Present   Feeling of Stress : Not at all  Social Connections: Moderately Integrated   Frequency of Communication with Friends and Family: More than three times a week   Frequency of Social Gatherings with Friends and Family: More than three times a week   Attends Religious Services: More than 4 times per year   Active Member of [employer] or Organizations: Yes   Attends Archivist Meetings: More than 4 times per year   Marital Status: Widowed  Human resources officer Violence: Not At Risk   Fear of Current or Ex-Partner: No   Emotionally Abused: No   Physically Abused: No   Sexually Abused: No    Family History  Problem Relation Age of Onset   Hypertension Mother    Heart disease Mother        Murmur and irregular heart disease   Dementia Mother        d. 23   Hypertension Father    Aneurysm Father 27   Breast cancer Cousin 42       mat first cousin    ROS: no fevers or chills, productive cough, hemoptysis, dysphasia, odynophagia, melena, hematochezia, dysuria, hematuria,  rash, seizure activity, orthopnea, PND, pedal edema, claudication. Remaining systems are negative.  Physical Exam: Well-developed well-nourished in no acute distress.  Skin is warm and dry.  HEENT is normal.  Neck is supple.  Chest is clear to auscultation with normal expansion.  Cardiovascular exam is irregular Abdominal exam nontender or distended. No masses palpated. Extremities show no  edema. neuro grossly intact  ECG-atrial fibrillation at a rate of 85, cannot rule out septal infarct, nonspecific ST changes.  Personally reviewed  A/P  1 permanent atrial fibrillation-continue Cardizem/atenolol for rate control.  Continue apixaban.  LV function is normal.  2 hypertension-blood pressure controlled.  Continue present medications and follow-up.  3 hyperlipidemia-continue statin.  Kirk Ruths, MD

## 2021-02-03 ENCOUNTER — Other Ambulatory Visit: Payer: Self-pay

## 2021-02-03 ENCOUNTER — Ambulatory Visit: Payer: Medicare PPO | Admitting: Cardiology

## 2021-02-03 ENCOUNTER — Encounter: Payer: Self-pay | Admitting: Cardiology

## 2021-02-03 VITALS — BP 120/70 | HR 85 | Ht 61.5 in | Wt 125.4 lb

## 2021-02-03 DIAGNOSIS — E7800 Pure hypercholesterolemia, unspecified: Secondary | ICD-10-CM | POA: Diagnosis not present

## 2021-02-03 DIAGNOSIS — I4821 Permanent atrial fibrillation: Secondary | ICD-10-CM

## 2021-02-03 DIAGNOSIS — I10 Essential (primary) hypertension: Secondary | ICD-10-CM

## 2021-02-03 NOTE — Patient Instructions (Signed)

## 2021-02-10 DIAGNOSIS — C44719 Basal cell carcinoma of skin of left lower limb, including hip: Secondary | ICD-10-CM | POA: Diagnosis not present

## 2021-02-17 ENCOUNTER — Telehealth: Payer: Self-pay | Admitting: Adult Health

## 2021-02-17 NOTE — Telephone Encounter (Signed)
Left message asking patient to call office  Mickel Baas will be out of office please r/s 02/21/21 appt

## 2021-02-21 ENCOUNTER — Ambulatory Visit: Payer: Medicare PPO

## 2021-03-08 ENCOUNTER — Other Ambulatory Visit: Payer: Self-pay | Admitting: Nurse Practitioner

## 2021-03-10 ENCOUNTER — Ambulatory Visit: Payer: Medicare PPO

## 2021-03-18 ENCOUNTER — Ambulatory Visit: Payer: Medicare PPO

## 2021-03-25 ENCOUNTER — Ambulatory Visit (INDEPENDENT_AMBULATORY_CARE_PROVIDER_SITE_OTHER): Payer: Medicare PPO

## 2021-03-25 VITALS — Ht 61.5 in | Wt 124.0 lb

## 2021-03-25 DIAGNOSIS — Z0000 Encounter for general adult medical examination without abnormal findings: Secondary | ICD-10-CM

## 2021-03-25 NOTE — Patient Instructions (Signed)
[name] , Thank you for taking time to come for your Medicare Wellness Visit. I appreciate your ongoing commitment to your health goals. Please review the following plan we discussed and let me know if I can assist you in the future.   Screening recommendations/referrals: Colonoscopy: not required Mammogram: completed 11/27/2020 Bone Density: completed 01/04/2020 Recommended yearly ophthalmology/optometry visit for glaucoma screening and checkup Recommended yearly dental visit for hygiene and checkup  Vaccinations: Influenza vaccine: completed 02/17/2021 Pneumococcal vaccine: completed 03/27/2014 Tdap vaccine: completed 03/27/2014, due 03/27/2024 Shingles vaccine: discussed   Covid-19: 08/26/2020, 06/06/2019, 05/08/2019  Advanced directives: Please bring a copy of your POA (Power of Attorney) and/or Living Will to your next appointment.   Conditions/risks identified: none  Next appointment: Follow up in one year for your annual wellness visit    Preventive Care 65 Years and Older, Female Preventive care refers to lifestyle choices and visits with your health care provider that can promote health and wellness. What does preventive care include? A yearly physical exam. This is also called an annual well check. Dental exams once or twice a year. Routine eye exams. Ask your health care provider how often you should have your eyes checked. Personal lifestyle choices, including: Daily care of your teeth and gums. Regular physical activity. Eating a healthy diet. Avoiding tobacco and drug use. Limiting alcohol use. Practicing safe sex. Taking low-dose aspirin every day. Taking vitamin and mineral supplements as recommended by your health care provider. What happens during an annual well check? The services and screenings done by your health care provider during your annual well check will depend on your age, overall health, lifestyle risk factors, and family history of disease. Counseling   Your health care provider may ask you questions about your: Alcohol use. Tobacco use. Drug use. Emotional well-being. Home and relationship well-being. Sexual activity. Eating habits. History of falls. Memory and ability to understand (cognition). Work and work Statistician. Reproductive health. Screening  You may have the following tests or measurements: Height, weight, and BMI. Blood pressure. Lipid and cholesterol levels. These may be checked every 5 years, or more frequently if you are over 52 years old. Skin check. Lung cancer screening. You may have this screening every year starting at age 72 if you have a 30-pack-year history of smoking and currently smoke or have quit within the past 15 years. Fecal occult blood test (FOBT) of the stool. You may have this test every year starting at age 20. Flexible sigmoidoscopy or colonoscopy. You may have a sigmoidoscopy every 5 years or a colonoscopy every 10 years starting at age 54. Hepatitis C blood test. Hepatitis B blood test. Sexually transmitted disease (STD) testing. Diabetes screening. This is done by checking your blood sugar (glucose) after you have not eaten for a while (fasting). You may have this done every 1-3 years. Bone density scan. This is done to screen for osteoporosis. You may have this done starting at age 9. Mammogram. This may be done every 1-2 years. Talk to your health care provider about how often you should have regular mammograms. Talk with your health care provider about your test results, treatment options, and if necessary, the need for more tests. Vaccines  Your health care provider may recommend certain vaccines, such as: Influenza vaccine. This is recommended every year. Tetanus, diphtheria, and acellular pertussis (Tdap, Td) vaccine. You may need a Td booster every 10 years. Zoster vaccine. You may need this after age 19. Pneumococcal 13-valent conjugate (PCV13) vaccine. One dose  is recommended  after age 49. Pneumococcal polysaccharide (PPSV23) vaccine. One dose is recommended after age 44. Talk to your health care provider about which screenings and vaccines you need and how often you need them. This information is not intended to replace advice given to you by your health care provider. Make sure you discuss any questions you have with your health care provider. Document Released: 05/17/2015 Document Revised: 01/08/2016 Document Reviewed: 02/19/2015 Elsevier Interactive Patient Education  2017 [place] Prevention in the Home Falls can cause injuries. They can happen to people of all ages. There are many things you can do to make your home safe and to help prevent falls. What can I do on the outside of my home? Regularly fix the edges of walkways and driveways and fix any cracks. Remove anything that might make you trip as you walk through a door, such as a raised step or threshold. Trim any bushes or trees on the path to your home. Use bright outdoor lighting. Clear any walking paths of anything that might make someone trip, such as rocks or tools. Regularly check to see if handrails are loose or broken. Make sure that both sides of any steps have handrails. Any raised decks and porches should have guardrails on the edges. Have any leaves, snow, or ice cleared regularly. Use sand or salt on walking paths during winter. Clean up any spills in your garage right away. This includes oil or grease spills. What can I do in the bathroom? Use night lights. Install grab bars by the toilet and in the tub and shower. Do not use towel bars as grab bars. Use non-skid mats or decals in the tub or shower. If you need to sit down in the shower, use a plastic, non-slip stool. Keep the floor dry. Clean up any water that spills on the floor as soon as it happens. Remove soap buildup in the tub or shower regularly. Attach bath mats securely with double-sided non-slip rug tape. Do not  have throw rugs and other things on the floor that can make you trip. What can I do in the bedroom? Use night lights. Make sure that you have a light by your bed that is easy to reach. Do not use any sheets or blankets that are too big for your bed. They should not hang down onto the floor. Have a firm chair that has side arms. You can use this for support while you get dressed. Do not have throw rugs and other things on the floor that can make you trip. What can I do in the kitchen? Clean up any spills right away. Avoid walking on wet floors. Keep items that you use a lot in easy-to-reach places. If you need to reach something above you, use a strong step stool that has a grab bar. Keep electrical cords out of the way. Do not use floor polish or wax that makes floors slippery. If you must use wax, use non-skid floor wax. Do not have throw rugs and other things on the floor that can make you trip. What can I do with my stairs? Do not leave any items on the stairs. Make sure that there are handrails on both sides of the stairs and use them. Fix handrails that are broken or loose. Make sure that handrails are as long as the stairways. Check any carpeting to make sure that it is firmly attached to the stairs. Fix any carpet that is loose or worn. Avoid  having throw rugs at the top or bottom of the stairs. If you do have throw rugs, attach them to the floor with carpet tape. Make sure that you have a light switch at the top of the stairs and the bottom of the stairs. If you do not have them, ask someone to add them for you. What else can I do to help prevent falls? Wear shoes that: Do not have high heels. Have rubber bottoms. Are comfortable and fit you well. Are closed at the toe. Do not wear sandals. If you use a stepladder: Make sure that it is fully opened. Do not climb a closed stepladder. Make sure that both sides of the stepladder are locked into place. Ask someone to hold it for  you, if possible. Clearly mark and make sure that you can see: Any grab bars or handrails. First and last steps. Where the edge of each step is. Use tools that help you move around (mobility aids) if they are needed. These include: Canes. Walkers. Scooters. Crutches. Turn on the lights when you go into a dark area. Replace any light bulbs as soon as they burn out. Set up your furniture so you have a clear path. Avoid moving your furniture around. If any of your floors are uneven, fix them. If there are any pets around you, be aware of where they are. Review your medicines with your doctor. Some medicines can make you feel dizzy. This can increase your chance of falling. Ask your doctor what other things that you can do to help prevent falls. This information is not intended to replace advice given to you by your health care provider. Make sure you discuss any questions you have with your health care provider. Document Released: 02/14/2009 Document Revised: 09/26/2015 Document Reviewed: 05/25/2014 Elsevier Interactive Patient Education  2017 [employer].

## 2021-03-25 NOTE — Progress Notes (Signed)
I connected with Adrienne [name] today by telephone and verified that I am speaking with the correct person using two identifiers. Location patient: home Location provider: work Persons participating in the virtual visit: Adrienne [name], Glenna Durand LPN.   I discussed the limitations, risks, security and privacy concerns of performing an evaluation and management service by telephone and the availability of in person appointments. I also discussed with the patient that there may be a patient responsible charge related to this service. The patient expressed understanding and verbally consented to this telephonic visit.    Interactive audio and video telecommunications were attempted between this provider and patient, however failed, due to patient having technical difficulties OR patient did not have access to video capability.  We continued and completed visit with audio only.     Vital signs may be patient reported or missing.  Subjective:   Adrienne [name] is a 86 y.o. female who presents for Medicare Annual (Subsequent) preventive examination.  Review of Systems     Cardiac Risk Factors include: advanced age (>58men, >29 women);diabetes mellitus;dyslipidemia;hypertension     Objective:    Today's Vitals   03/25/21 1326  Weight: 124 lb (56.2 kg)  Height: 5' 1.5" (1.562 m)   Body mass index is 23.05 kg/m.  Advanced Directives 03/25/2021 02/21/2020 04/11/2019 05/20/2018 03/15/2018 02/11/2018 02/11/2018  Does Patient Have a Medical Advance Directive? Yes Yes Yes Yes Yes No No  Type of Paramedic of [place];Living will [place];Living will Living will [place];Living will [place];Living will - -  Does patient want to make changes to medical advance directive? - - No - Patient declined - - - -  Copy of [place] in Chart? No - copy requested No - copy requested - No - copy requested  No - copy requested - -  Would patient like information on creating a medical advance directive? - - No - Patient declined - - - -    Current Medications (verified) Outpatient Encounter Medications as of 03/25/2021  Medication Sig   Accu-Chek Softclix Lancets lancets Use as instructed   allopurinol (ZYLOPRIM) 300 MG tablet TAKE 1 TABLET BY MOUTH EVERY DAY   atenolol (TENORMIN) 25 MG tablet TAKE 1 TABLET BY MOUTH EVERY DAY   Blood Glucose Monitoring Suppl (BLOOD GLUCOSE MONITOR SYSTEM) w/Device KIT Accu Check Aviva Meter   chlorpheniramine (CHLOR-TRIMETON) 4 MG tablet Take 4 mg 2 (two) times daily as needed by mouth for allergies.   diltiazem (CARDIZEM CD) 360 MG 24 hr capsule TAKE 1 CAPSULE BY MOUTH EVERY DAY   ELIQUIS 2.5 MG TABS tablet TAKE 1 TABLET BY MOUTH TWICE A DAY   fish oil-omega-3 fatty acids 1000 MG capsule Take 1 g by mouth daily.   glucose blood (ACCU-CHEK AVIVA PLUS) test strip USE TO TEST TWICE DAILY **ICD-10 CODE E13.9**   metFORMIN (GLUCOPHAGE) 500 MG tablet TAKE 1 TABLET BY MOUTH TWICE A DAY   Multiple Vitamin (MULTIVITAMIN) tablet Take 1 tablet by mouth daily.   OVER THE COUNTER MEDICATION Take by mouth 2 (two) times daily. Presavision- for macular degeneration prevention   rosuvastatin (CRESTOR) 5 MG tablet TAKE 1 TABLET BY MOUTH EVERY DAY   VITAMIN D, ERGOCALCIFEROL, PO Take by mouth.   nitrofurantoin, macrocrystal-monohydrate, (MACROBID) 100 MG capsule Take 1 capsule (100 mg total) by mouth 2 (two) times daily. (Patient not taking: Reported on 03/25/2021)   phenazopyridine (PYRIDIUM) 95 MG tablet Take 95 mg  by mouth 3 (three) times daily as needed for pain. (Patient not taking: Reported on 02/21/2020)   No facility-administered encounter medications on file as of 03/25/2021.    Allergies (verified) Penicillins, Other, Prednisone, Radiaplexrx [wound dressings], and Sulfamethoxazole   History: Past Medical History:  Diagnosis Date   Allergy    Atrial fibrillation  ([place])    Breast cancer ([place]) 08/25/12   Invasive ductal ca,DCIS   Diabetes mellitus    type II   Family history of cancer    Gout    Hearing loss    History of breast cancer    History of frequent urinary tract infections    History of radiation therapy 03/21/18- 04/19/18   Right Breast 2.67 Gy X 15 fractions, right breast boost 2 Gy X 5 fractions.    Hx of radiation therapy 11/22/12- 12/19/12   breast 4250 cGy 17 sessions, left breast boost 750 cGy 3 sessions   Hyperlipidemia    Hypertension    Osteopenia    Past Surgical History:  Procedure Laterality Date   ABDOMINAL HYSTERECTOMY Bilateral 1999   w/b/l salpingo-oopherectomy   APPENDECTOMY     BREAST LUMPECTOMY WITH NEEDLE LOCALIZATION AND AXILLARY SENTINEL LYMPH NODE BX Left 09/12/2012   Procedure: LEFT NEEDLE LOCALIZATION BREAST LUMPECTOMY TION AND AXILLARY SENTINEL LYMPH NODE BX;  Surgeon: Edward Jolly, MD;  Location: [facility];  Service: General;  Laterality: Left;   BREAST LUMPECTOMY WITH RADIOACTIVE SEED AND SENTINEL LYMPH NODE BIOPSY Right 10/26/2017   Procedure: BREAST LUMPECTOMY WITH RADIOACTIVE SEED AND SENTINEL LYMPH NODE BIOPSY;  Surgeon: Excell Seltzer, MD;  Location: [facility];  Service: General;  Laterality: Right;   BREAST SURGERY  1990's   right- fibroid cyst   CHOLECYSTECTOMY     SEPTOPLASTY     TONSILLECTOMY     Family History  Problem Relation Age of Onset   Hypertension Mother    Heart disease Mother        Murmur and irregular heart disease   Dementia Mother        d. 63   Hypertension Father    Aneurysm Father 18   Breast cancer Cousin 66       mat first cousin   Social History   Socioeconomic History   Marital status: Married    Spouse name: Not on file   Number of children: 3   Years of education: Not on file   Highest education level: Not on file  Occupational History   Not on file  Tobacco Use   Smoking status: Former    Packs/day: 0.25    Years: 4.00    Pack  years: 1.00    Types: Cigarettes   Smokeless tobacco: Never   Tobacco comments:    Cigarette use was 50 years ago  Vaping Use   Vaping Use: Never used  Substance and Sexual Activity   Alcohol use: Not Currently   Drug use: No   Sexual activity: Not Currently    Comment: menarche age 10, 9st birth age 29, G76, HRT x 7 years?  Other Topics Concern   Not on file  Social History Narrative   Married - husband in memory care unit    Three children ( One lives at [place] and two in [place]      She likes to read and go to [employer]    Social Determinants of Health   Financial Resource Strain: Low Risk    Difficulty of Paying Living Expenses: Not  hard at all  Food Insecurity: No Food Insecurity   Worried About Programme researcher, broadcasting/film/video in the Last Year: Never true   Ran Out of Food in the Last Year: Never true  Transportation Needs: No Transportation Needs   Lack of Transportation (Medical): No   Lack of Transportation (Non-Medical): No  Physical Activity: Sufficiently Active   Days of Exercise per Week: 7 days   Minutes of Exercise per Session: 30 min  Stress: No Stress Concern Present   Feeling of Stress : Not at all  Social Connections: Not on file    Tobacco Counseling Counseling given: Not Answered Tobacco comments: Cigarette use was 50 years ago   Clinical Intake:  Pre-visit preparation completed: Yes  Pain : No/denies pain     Nutritional Status: BMI of 19-24  Normal Nutritional Risks: None Diabetes: Yes  How often do you need to have someone help you when you read instructions, pamphlets, or other written materials from your doctor or pharmacy?: 1 - Never What is the last grade level you completed in school?: masters degree  Diabetic? Yes Nutrition Risk Assessment:  Has the patient had any N/V/D within the last 2 months?  No  Does the patient have any non-healing wounds?  No  Has the patient had any unintentional weight loss or weight gain?  No    Diabetes:  Is the patient diabetic?  Yes  If diabetic, was a CBG obtained today?  No  Did the patient bring in their glucometer from home?  No  How often do you monitor your CBG's? 2-3 weekly.   Financial Strains and Diabetes Management:  Are you having any financial strains with the device, your supplies or your medication? No .  Does the patient want to be seen by Chronic Care Management for management of their diabetes?  No  Would the patient like to be referred to a Nutritionist or for Diabetic Management?  No   Diabetic Exams:  Diabetic Eye Exam: Completed per patient Diabetic Foot Exam: Overdue, Pt has been advised about the importance in completing this exam. Pt is scheduled for diabetic foot exam on next appointment.   Interpreter Needed?: No  Information entered by :: NAllen LPN   Activities of Daily Living In your present state of health, do you have any difficulty performing the following activities: 03/25/2021  Hearing? Y  Comment has hearing aides  Vision? N  Difficulty concentrating or making decisions? N  Walking or climbing stairs? N  Dressing or bathing? N  Doing errands, shopping? N  Preparing Food and eating ? N  Using the Toilet? N  In the past six months, have you accidently leaked urine? Y  Do you have problems with loss of bowel control? N  Managing your Medications? N  Managing your Finances? N  Housekeeping or managing your Housekeeping? N  Some recent data might be hidden    Patient Care Team: Shirline Frees, NP as PCP - General (Family Medicine) Jens Som Madolyn Frieze, MD as PCP - Cardiology (Cardiology)  Indicate any recent Medical Services you may have received from other than [facility] providers in the past year (date may be approximate).     Assessment:   This is a routine wellness examination for [facility]  Hearing/Vision screen Vision Screening - Comments:: Regular eye exams, Dr. Blima Ledger  Dietary issues and exercise activities  discussed: Current Exercise Habits: Home exercise routine, Type of exercise: yoga;walking, Time (Minutes): 30, Frequency (Times/Week): 7, Weekly Exercise (Minutes/Week): 210  Goals Addressed             This Visit's Progress    Patient Stated       03/25/2021, wants to decrease amount of medications       Depression Screen PHQ 2/9 Scores 03/25/2021 02/21/2020 04/29/2018 10/11/2017 03/10/2016 03/10/2016 08/16/2015  PHQ - 2 Score 0 0 0 0 0 0 0  PHQ- 9 Score - 0 - - - - -    Fall Risk Fall Risk  03/25/2021 02/21/2020 04/29/2018 10/11/2017 03/10/2016  Falls in the past year? 0 0 0 No No  Number falls in past yr: - 0 - - -  Injury with Fall? - 0 - - -  Risk for fall due to : Medication side effect Orthopedic patient - - -  Follow up Falls evaluation completed;Education provided;Falls prevention discussed Falls evaluation completed - - -    FALL RISK PREVENTION PERTAINING TO THE HOME:  Any stairs in or around the home? Yes  If so, are there any without handrails? No  Home free of loose throw rugs in walkways, pet beds, electrical cords, etc? Yes  Adequate lighting in your home to reduce risk of falls? Yes   ASSISTIVE DEVICES UTILIZED TO PREVENT FALLS:  Life alert? No  Use of a cane, walker or w/c? No  Grab bars in the bathroom? Yes  Shower chair or bench in shower? Yes  Elevated toilet seat or a handicapped toilet? No   TIMED UP AND GO:  Was the test performed? No .       Cognitive Function:     6CIT Screen 03/25/2021 02/21/2020  What Year? 0 points 0 points  What month? 0 points 0 points  What time? 0 points 0 points  Count back from 20 0 points 0 points  Months in reverse 2 points 0 points  Repeat phrase 2 points 0 points  Total Score 4 0    Immunizations Immunization History  Administered Date(s) Administered   Fluad Quad(high Dose 65+) 02/04/2019, 01/09/2020   Influenza Split 02/17/2012, 02/10/2013, 02/15/2014   Influenza Whole 05/04/2005, 01/26/2008,  02/16/2009, 02/01/2010   Influenza, High Dose Seasonal PF 01/25/2015, 01/22/2016, 02/06/2017, 02/06/2017, 01/21/2018, 02/17/2021   Moderna Sars-Covid-2 Vaccination 05/08/2019, 06/06/2019, 08/26/2020   Pneumococcal Conjugate-13 03/27/2014   Pneumococcal Polysaccharide-23 09/30/2001, 05/28/2010   Td 12/28/2003   Tetanus 03/27/2014   Zoster, Live 12/08/2007    TDAP status: Up to date  Flu Vaccine status: Up to date  Pneumococcal vaccine status: Up to date  Covid-19 vaccine status: Completed vaccines  Qualifies for Shingles Vaccine? Yes   Zostavax completed Yes   Shingrix Completed?: No.    Education has been provided regarding the importance of this vaccine. Patient has been advised to call insurance company to determine out of pocket expense if they have not yet received this vaccine. Advised may also receive vaccine at local pharmacy or Health Dept. Verbalized acceptance and understanding.  Screening Tests Health Maintenance  Topic Date Due   Zoster Vaccines- Shingrix (1 of 2) Never done   URINE MICROALBUMIN  03/24/2016   OPHTHALMOLOGY EXAM  03/30/2018   FOOT EXAM  04/30/2019   HEMOGLOBIN A1C  05/01/2020   COVID-19 Vaccine (4 - Booster for Moderna series) 10/21/2020   MAMMOGRAM  11/27/2021   TETANUS/TDAP  03/27/2024   Pneumonia Vaccine 77+ Years old  Completed   INFLUENZA VACCINE  Completed   DEXA SCAN  Completed   HPV VACCINES  Aged Out    Health Maintenance  Health Maintenance Due  Topic Date Due   Zoster Vaccines- Shingrix (1 of 2) Never done   URINE MICROALBUMIN  03/24/2016   OPHTHALMOLOGY EXAM  03/30/2018   FOOT EXAM  04/30/2019   HEMOGLOBIN A1C  05/01/2020   COVID-19 Vaccine (4 - Booster for Moderna series) 10/21/2020    Colorectal cancer screening: No longer required.   Mammogram status: Completed 11/27/2020. Repeat every year  Bone Density status: Completed 01/04/2020.   Lung Cancer Screening: (Low Dose CT Chest recommended if Age 22-80 years, 30 pack-year  currently smoking OR have quit w/in 15years.) does not qualify.   Lung Cancer Screening Referral: no  Additional Screening:  Hepatitis C Screening: does not qualify;   Vision Screening: Recommended annual ophthalmology exams for early detection of glaucoma and other disorders of the eye. Is the patient up to date with their annual eye exam?  Yes  Who is the provider or what is the name of the office in which the patient attends annual eye exams? Dr. Marica Otter If pt is not established with a provider, would they like to be referred to a provider to establish care? No .   Dental Screening: Recommended annual dental exams for proper oral hygiene  Community Resource Referral / Chronic Care Management: CRR required this visit?  No   CCM required this visit?  No      Plan:     I have personally reviewed and noted the following in the patient's chart:   Medical and social history Use of alcohol, tobacco or illicit drugs  Current medications and supplements including opioid prescriptions.  Functional ability and status Nutritional status Physical activity Advanced directives List of other physicians Hospitalizations, surgeries, and ER visits in previous 12 months Vitals Screenings to include cognitive, depression, and falls Referrals and appointments  In addition, I have reviewed and discussed with patient certain preventive protocols, quality metrics, and best practice recommendations. A written personalized care plan for preventive services as well as general preventive health recommendations were provided to patient.     Kellie Simmering, LPN   [id number]   Nurse Notes: none

## 2021-04-04 ENCOUNTER — Telehealth: Payer: Self-pay | Admitting: Nurse Practitioner

## 2021-04-04 ENCOUNTER — Other Ambulatory Visit: Payer: Self-pay | Admitting: Adult Health

## 2021-04-04 DIAGNOSIS — E139 Other specified diabetes mellitus without complications: Secondary | ICD-10-CM

## 2021-04-04 NOTE — Telephone Encounter (Signed)
Left message with rescheduled upcoming appointment due to provider's PAL. 

## 2021-04-06 ENCOUNTER — Other Ambulatory Visit: Payer: Self-pay | Admitting: Cardiology

## 2021-04-07 NOTE — Telephone Encounter (Signed)
Prescription refill request for Eliquis received. Indication:Afib Last office visit:10/22 Scr:1.1 Age: 86 Weight:56.2 kg  Prescription refilled

## 2021-04-10 DIAGNOSIS — H40051 Ocular hypertension, right eye: Secondary | ICD-10-CM | POA: Diagnosis not present

## 2021-04-10 DIAGNOSIS — H524 Presbyopia: Secondary | ICD-10-CM | POA: Diagnosis not present

## 2021-04-10 DIAGNOSIS — H40052 Ocular hypertension, left eye: Secondary | ICD-10-CM | POA: Diagnosis not present

## 2021-04-10 DIAGNOSIS — H40001 Preglaucoma, unspecified, right eye: Secondary | ICD-10-CM | POA: Diagnosis not present

## 2021-04-10 DIAGNOSIS — H52223 Regular astigmatism, bilateral: Secondary | ICD-10-CM | POA: Diagnosis not present

## 2021-04-10 DIAGNOSIS — H5213 Myopia, bilateral: Secondary | ICD-10-CM | POA: Diagnosis not present

## 2021-04-10 DIAGNOSIS — H40002 Preglaucoma, unspecified, left eye: Secondary | ICD-10-CM | POA: Diagnosis not present

## 2021-04-21 NOTE — Progress Notes (Signed)
Adrienne [name]   [phone] [phone]   Clinic Follow up Note   Patient Care Team: [name], NP as PCP - General (Family Medicine) Lelon Perla, MD as PCP - Cardiology (Cardiology) 04/22/2021  CHIEF COMPLAINT: Follow up triple negative breast cancer   SUMMARY OF ONCOLOGIC HISTORY: Oncology History Overview Note  Cancer Staging Breast cancer of upper-outer quadrant of right female breast [facility]) Staging form: Breast, AJCC 8th Edition - Clinical stage from 09/22/2017: Stage IB (cT1b, cN0, cM0, G2, ER-, PR-, HER2-) - Signed by Truitt Merle, MD on 10/08/2017  Breast cancer, left breast [facility]) Staging form: Breast, AJCC 7th Edition - Clinical stage from 09/12/2012: Stage IA (T1b, N0, M0) - Unsigned      Breast cancer, left breast ([place])  08/25/2012 Receptors her2   ER 100% positive, PR 88% positive, HER-2 negative, Ki-67 16%   08/30/2012 Initial Diagnosis   Breast cancer, left breast   09/12/2012 Pathology Results   T1bN0 Grade 1 invasive ductal carcinoma, and DCIS.   09/12/2012 Surgery   Left breast lumpectomy and sentinel lymph node biopsy, negative margins.   11/09/2012 - 12/13/2012 Radiation Therapy   Adjuvant breast radiation   12/2012 - 11/2017 Anti-estrogen oral therapy   Anastrozole 1 mg daily, switched to Aromasin 25 mg daily in Jan 2015 due to diarrhea. Her Exemestane was switched to letrozole in 08/2017 due to high copay. She completed her 5 years in 11/2017.    09/01/2016 Imaging   [name] Outside films Breast form [facility] 09/01/2016 IMPRESSION: Probably Benign 1. No significant change in oval hypoechoic masses in the left breast at 11:00 2 cm from the nipple and 10:30 4 cm from the nipple. Both of these masses resemble the area of fat necrosis previously biopsies at 2:00. In addition, both masses have developing central benign or dystrophic appearing calcifications and are favored to be other areas of fat necrosis. A 6 month follow-up left breast  mammogram and ultrasound is recommended.   09/01/2016 Mammogram   [facility] Mammogram from [facility] 09/01/16 IMPRESSION  Incomplete - additional imaging evaluation needed Ultrasound of the upper medial left breast mass is recommended.    03/09/2017 Mammogram   Previous lumpectomy changes in the upper outer left breast anterior depth with stable associated dystrophic calcifications.  Biopsy clip at the 2:00 in the left breast near the lumpectomy site corresponding to previously biopsied benign fat necrosis.  Persistent round mass in the upper medial left breast with benign-appearing central round calcification.  No significant masses, calcifications, or other findings are seen in the breast  Impression: ultrasound is recommended for the left breast   03/09/2017 Breast US   Left breast ultrasound impression:  No significant change in oval hypoechoic masses in the left breast at 11:00 2 cm from the nipple and 10:30 4 cm from the nipple.  Both of these masses resemble the area of fat necrosis previously biopsied at 2:00.  In addition, both masses have developing central benign or dystrophic appearing calcifications and are favored to be other areas of fat necrosis.  A 6-monthfollow-up bilateral mammogram and left breast ultrasound is recommended.   09/14/2017 Breast [place]  Breast [place] 09[place]14[place]17 at [facility]  IMPRESSION:  The 1 cm oval mass in the left breast at 9:30 posterior depth is highly suggestive of malignancy. An Ultrasound guided biopsy is recommended.  The 0.8 cm round mass in the left breast at 11-12 o'clock anterior depth is suspicious of malignancy. An ultrasound guided biopsy  is recommended.    09/22/2017 Pathology Results   Diagnosis 1. Breast, right, needle core biopsy - INVASIVE DUCTAL CARCINOMA, MSBR GRADE II/III. - DUCTAL CARCINOMA IN SITU WITH NECROSIS. 2. Breast, left, needle core biopsy - FAT NECROSIS. - NO EVIDENCE OF MALIGNANCY.   10/17/2017 Genetic Testing   Negative genetic  testing on the Multicancer panel.  The Multi-Gene Panel offered by [facility] includes sequencing and/or deletion duplication testing of the following 83 genes: ALK, APC, ATM, AXIN2,BAP1,  BARD1, BLM, BMPR1A, BRCA1, BRCA2, BRIP1, CASR, CDC73, CDH1, CDK4, CDKN1B, CDKN1C, CDKN2A (p14ARF), CDKN2A (p16INK4a), CEBPA, CHEK2, CTNNA1, DICER1, DIS3L2, EGFR (c.2369C>T, p.Thr790Met variant only), EPCAM (Deletion/duplication testing only), FH, FLCN, GATA2, GPC3, GREM1 (Promoter region deletion/duplication testing only), HOXB13 (c.251G>A, p.Gly84Glu), HRAS, KIT, MAX, MEN1, MET, MITF (c.952G>A, p.Glu318Lys variant only), MLH1, MSH2, MSH3, MSH6, MUTYH, NBN, NF1, NF2, NTHL1, PALB2, PDGFRA, PHOX2B, PMS2, POLD1, POLE, POT1, PRKAR1A, PTCH1, PTEN, RAD50, RAD51C, RAD51D, RB1, RECQL4, RET, RUNX1, SDHAF2, SDHA (sequence changes only), SDHB, SDHC, SDHD, SMAD4, SMARCA4, SMARCB1, SMARCE1, STK11, SUFU, TERT, TERT, TMEM127, TP53, TSC1, TSC2, VHL, WRN and WT1.  The report date is October 17, 2017.   Breast cancer of upper-outer quadrant of right female breast ([place])  09/14/2017 Mammogram   Diagnostic mammogram at [facility]  IMPRESSION:  The new 0.9 cm oval high density mass in the right breast posterior depth superior region seen on the mediolateral oblique view only is indeterminate. The 1.1 cm focal asymmetry in the left breast central to the nipple anterior depth is indeterminate.    09/14/2017 Imaging   [name] Bilateral at [facility] IMPRESSION: The 1 cm oval mass in the right breast at 9:30 posterior depth is highly suggestive of malignancy. The 0.8 cm round mass in the left breast at 11-12 o'clock anterior depth is suspicious of malignancy.    09/22/2017 Cancer Staging   Staging form: Breast, AJCC 8th Edition - Clinical stage from 09/22/2017: Stage IB (cT1b, cN0, cM0, G2, ER-, PR-, HER2-) - Signed by Truitt Merle, MD on 10/08/2017    09/22/2017 Pathology Results   Bilateral needle core biopsy 1. Breast, right, needle core biopsy - INVASIVE DUCTAL  CARCINOMA, MSBR GRADE II/III. - DUCTAL CARCINOMA IN SITU WITH NECROSIS. 2. Breast, left, needle core biopsy - FAT NECROSIS. - NO EVIDENCE OF MALIGNANCY.   09/25/2017 Receptors her2   Estrogen Receptor: 0 Progesterone 0 HER2: Negative. Ki-67: 80%    10/08/2017 Initial Diagnosis   Breast cancer of upper-outer quadrant of right female breast ([place])   10/26/2017 Surgery   BREAST LUMPECTOMY WITH RADIOACTIVE SEED AND SENTINEL LYMPH NODE BIOPSY by Dr. Excell Seltzer  10/26/17   10/26/2017 Pathology Results   Diagnosis 10/26/17 1. Breast, lumpectomy, Right - INVASIVE DUCTAL CARCINOMA, GRADE 2, SPANNING 1 CM. - HIGH GRADE DUCTAL CARCINOMA IN SITU WITH NECROSIS. - FINAL RESECTION MARGINS (PARTS #2, 3, 4) ARE NEGATIVE. - BIOPSY SITE. - SEE ONCOLOGY TABLE. 2. Breast, excision, additional anterior margin- 2 pieces right - BENIGN BREAST TISSUE. 3. Breast, excision, Right unoriented anterior margin - BENIGN BREAST TISSUE. 4. Breast, excision, Right deep margin - BENIGN BREAST TISSUE. 5. Lymph node, sentinel, biopsy, Right Axillary - ONE OF ONE LYMPH NODES NEGATIVE FOR CARCINOMA (0/1).   10/26/2017 Cancer Staging   Staging form: Breast, AJCC 8th Edition - Pathologic stage from 10/26/2017: Stage IB (pT1b, pN0, cM0, G2, ER-, PR-, HER2-) - Signed by Truitt Merle, MD on 11/12/2017    12/03/2017 - 02/25/2018 Chemotherapy   Weekly Abraxane for 12 weeks 12/03/17-02/25/18   03/2018 - 04/19/2018  Radiation Therapy   With Dr. Isidore Moos     CURRENT THERAPY: Surveillance   INTERVAL HISTORY: Ms. Westervelt returns for follow up as scheduled. Last seen by Dr. Burr Medico 10/30/20. Mammogram 11/27/20 was negative.  She is doing well in general. She has persistent left hip arthritis, bursitis, and sciatica, plans to start PT after the holiday.  Remains active with walking and uses the rowing machine.  Last week she developed right mid-low back pain she attributes to wrong sleeping position. Pain can be as high as 8/10 at the most,  Tylenol helps.  Pain is minimal today, and not limiting quality of life or daily function.  Denies other bone pain.  She has significant scar tissue in the right breast which seems a little "deeper" at the axilla.  Denies new lump/mass, nipple discharge or inversion, or skin change.  She had to start masking again at her facility which causes her to sneeze. Denies fever, chills, cough, chest pain, dyspnea, abdominal pain or bloating, change in bowel habits, bleeding, weight loss, or any other specific complaints.   MEDICAL HISTORY:  Past Medical History:  Diagnosis Date   Allergy    Atrial fibrillation ([place])    Breast cancer ([place]) 08/25/12   Invasive ductal ca,DCIS   Diabetes mellitus    type II   Family history of cancer    Gout    Hearing loss    History of breast cancer    History of frequent urinary tract infections    History of radiation therapy 03/21/18- 04/19/18   Right Breast 2.67 Gy X 15 fractions, right breast boost 2 Gy X 5 fractions.    Hx of radiation therapy 11/22/12- 12/19/12   breast 4250 cGy 17 sessions, left breast boost 750 cGy 3 sessions   Hyperlipidemia    Hypertension    Osteopenia     SURGICAL HISTORY: Past Surgical History:  Procedure Laterality Date   ABDOMINAL HYSTERECTOMY Bilateral 1999   w/b/l salpingo-oopherectomy   APPENDECTOMY     BREAST LUMPECTOMY WITH NEEDLE LOCALIZATION AND AXILLARY SENTINEL LYMPH NODE BX Left 09/12/2012   Procedure: LEFT NEEDLE LOCALIZATION BREAST LUMPECTOMY TION AND AXILLARY SENTINEL LYMPH NODE BX;  Surgeon: Edward Jolly, MD;  Location: [place];  Service: General;  Laterality: Left;   BREAST LUMPECTOMY WITH RADIOACTIVE SEED AND SENTINEL LYMPH NODE BIOPSY Right 10/26/2017   Procedure: BREAST LUMPECTOMY WITH RADIOACTIVE SEED AND SENTINEL LYMPH NODE BIOPSY;  Surgeon: Excell Seltzer, MD;  Location: [place];  Service: General;  Laterality: Right;   BREAST SURGERY  1990's   right- fibroid cyst    CHOLECYSTECTOMY     SEPTOPLASTY     TONSILLECTOMY      I have reviewed the social history and family history with the patient and they are unchanged from previous note.  ALLERGIES:  is allergic to penicillins, other, prednisone, radiaplexrx [wound dressings], and sulfamethoxazole.  MEDICATIONS:  Current Outpatient Medications  Medication Sig Dispense Refill   atenolol (TENORMIN) 25 MG tablet TAKE 1 TABLET BY MOUTH EVERY DAY 90 tablet 3   Accu-Chek Softclix Lancets lancets Use as instructed 200 each 12   allopurinol (ZYLOPRIM) 300 MG tablet TAKE 1 TABLET BY MOUTH EVERY DAY 90 tablet 3   Blood Glucose Monitoring Suppl (BLOOD GLUCOSE MONITOR SYSTEM) w/Device KIT Accu Check Aviva Meter 1 kit 0   chlorpheniramine (CHLOR-TRIMETON) 4 MG tablet Take 4 mg 2 (two) times daily as needed by mouth for allergies.     diltiazem (CARDIZEM CD) 360  MG 24 hr capsule TAKE 1 CAPSULE BY MOUTH EVERY DAY 90 capsule 3   ELIQUIS 2.5 MG TABS tablet TAKE 1 TABLET BY MOUTH TWICE A DAY 180 tablet 1   fish oil-omega-3 fatty acids 1000 MG capsule Take 1 g by mouth daily.     glucose blood (ACCU-CHEK AVIVA PLUS) test strip USE TO TEST TWICE DAILY **ICD-10 CODE E13.9** 200 each 3   metFORMIN (GLUCOPHAGE) 500 MG tablet Take 1 tablet (500 mg total) by mouth 2 (two) times daily. Needs office visit 60 tablet 0   Multiple Vitamin (MULTIVITAMIN) tablet Take 1 tablet by mouth daily.     nitrofurantoin, macrocrystal-monohydrate, (MACROBID) 100 MG capsule Take 1 capsule (100 mg total) by mouth 2 (two) times daily. (Patient not taking: Reported on 03/25/2021) 13 capsule 0   OVER THE COUNTER MEDICATION Take by mouth 2 (two) times daily. Presavision- for macular degeneration prevention     phenazopyridine (PYRIDIUM) 95 MG tablet Take 95 mg by mouth 3 (three) times daily as needed for pain. (Patient not taking: Reported on 02/21/2020)     rosuvastatin (CRESTOR) 5 MG tablet TAKE 1 TABLET BY MOUTH EVERY DAY 90 tablet 1   VITAMIN D,  ERGOCALCIFEROL, PO Take by mouth.     No current facility-administered medications for this visit.    PHYSICAL EXAMINATION: ECOG PERFORMANCE STATUS: 1 - Symptomatic but completely ambulatory  Vitals:   04/22/21 1055 04/22/21 1153  BP: (!) 130/119 (!) 140/100  Pulse: 98   Resp: 18   Temp: (!) 97.3 F (36.3 C)   SpO2: 97%    Filed Weights   04/22/21 1055  Weight: 123 lb 12.8 oz (56.2 kg)    GENERAL:alert, no distress and comfortable SKIN: no rash  EYES: sclera clear NECK: without mass LYMPH:  no palpable cervical or supraclavicular lymphadenopathy  LUNGS: clear with normal breathing effort HEART: regular rate & rhythm, no lower extremity edema ABDOMEN:abdomen soft, non-tender and normal bowel sounds Musculoskeletal: mild ttp right flank/low back  NEURO: alert & oriented x 3 with fluent speech, no focal motor/sensory deficits Breast: breasts are symmetrical without nipple discharge, nipples are inverted. S/p bilateral lumpectomies, incisions completely healed with significant scar tissue at the breast and axilla. No palpable mass or nodularity in either breast or axilla that I could appreciate.    LABORATORY DATA:  I have reviewed the data as listed CBC Latest Ref Rng & Units 04/22/2021 10/30/2020 05/01/2020  WBC 4.0 - 10.5 K/uL 7.5 7.6 7.6  Hemoglobin 12.0 - 15.0 g/dL 14.0 13.2 12.7  Hematocrit 36.0 - 46.0 % 42.8 40.1 39.1  Platelets 150 - 400 K/uL 137(L) 162 200     CMP Latest Ref Rng & Units 04/22/2021 10/30/2020 05/01/2020  Glucose 70 - 99 mg/dL 189(H) 180(H) 169(H)  BUN 8 - 23 mg/dL _0 Creatinine 0.44 - 1.00 mg/dL 1.04(H) 1.15(H) 1.16(H)  Sodium 135 - 145 mmol/L 140 141 141  Potassium 3.5 - 5.1 mmol/L 3.8 3.7 3.8  Chloride 98 - 111 mmol/L 104 107 108  CO2 22 - 32 mmol/L _1 Calcium 8.9 - 10.3 mg/dL 9.7 9.7 9.4  Total Protein 6.5 - 8.1 g/dL 7.0 6.7 6.9  Total Bilirubin 0.3 - 1.2 mg/dL 0.6 0.7 0.5  Alkaline Phos 38 - 126 U/L 104 94 81  AST 15 - 41  U/L _2 ALT 0 - 44 U/L _3 RADIOGRAPHIC STUDIES: I have personally reviewed the radiological  images as listed and agreed with the findings in the report. No results found.   ASSESSMENT & PLAN:  86 y.o. [place], [place] woman    1. Breast cancer of upper outer quadrant of right breast, invasive ductal carcinoma, stage IB, pT1bN0M0, grade 2, Triple Negative, Ki67: 80% -Diagnosed in 09/2017, s/p right lumpectomy with SLNB, 12 weeks of adjuvant Abraxane, and adjuvant radiation  - genetic testing was negative -Adrienne [name] is clinically doing well. Breast exam is benign. Labs normal except BG 189, plt 137, Scr 1.04. mammogram 11/2020 was negative. Overall there is no clinical concern for breast cancer recurrence.  -she is over 3 years from initial diagnosis, the recurrence risk is decreasing.  -Continue surveillance -lab and f/up in 6 months, or sooner if needed.    2. pT1bN0, stage IA invasive ductal carcinoma of the left breast, grade I, ER 100%, PR 88%, Ki-67 16%, HER-2/neu negative. -Diagnosed in 09/2012, s/p lumpectomy with SLNB 09/12/2012, adjuvant radiation, and adjuvant antiestrogen therapy with exemestane from 12/2012-11/2017 -Abnormal mammogram on 10/2019, biopsy 10/25/2019 showed benign fat necrosis with calcifications   3. Osteopenia -She is on vitamin D supplement daily; she stopped calcium due to hypercalcemia -DEXA 09/2017  and 01/2020 showed osteopenia and very high risk of fracture, 29% for major osteoporotic fracture and 18% for hip fracture.  This has been stable over the last 2 years - Dr. Burr Medico and I both have previously discussed bisphosphonate vs Prolia, patient is reluctant due to side effects.  -Due to renal dysfunction Prolia would be safer. Also reasonable to continue cal/vit D given that she is otherwise healthy and active, no recent fall.    4. Diabetes, HTN -She had elevated blood sugar when on steroids for hip pain, DC'd steroids  -She is on metformin and  other meds -DBP >100 today, she has not taken all her meds.  -BG 189 -f/up with PCP    5. Social Support -She lives at [facility] and is very independent -Her children live close by her  -She recently has a new great grandson, has not seen him yet due to covid19   6. CKD -secondary to DM, improved today  7. Right back pain, arthritis  -She has h/o left hip pain secondary to arthritis and bursitis.  -She recently developed right mid-low back pain she attributes to sleeping incorrectly positioned. -she has mild ttp on exam. This is likely MSK, secondary to rowing or positional.  -she has imaging that shows bilateral degenerative hip disease, could be referred pain from that.  -I reviewed possibility of bone metastasis from triple negative breast cancer but my suspicion is not very high. -I offered imaging vs surveillance. Pain is minimal today and she will monitor closely and let us know if pain worsens, fails to improve, or limits QOL or daily function  PLAN: -Labs, mammogram reviewed -Discussed R mid/low back pain imaging vs observation. Pt will monitor -Continue breast cancer surveillance -F/up 6 months or sooner if needed   All questions were answered. The patient knows to call the clinic with any problems, questions or concerns. No barriers to learning was detected. I spent 20 minutes counseling the patient face to face. The total time spent in the appointment was 30 minutes and more than 50% was on counseling and review of test results     Alla Feeling, NP 04/22/21

## 2021-04-22 ENCOUNTER — Encounter: Payer: Self-pay | Admitting: Nurse Practitioner

## 2021-04-22 ENCOUNTER — Inpatient Hospital Stay: Payer: Medicare PPO | Attending: Nurse Practitioner

## 2021-04-22 ENCOUNTER — Other Ambulatory Visit: Payer: Self-pay

## 2021-04-22 ENCOUNTER — Inpatient Hospital Stay: Payer: Medicare PPO | Admitting: Nurse Practitioner

## 2021-04-22 VITALS — BP 140/100 | HR 98 | Temp 97.3°F | Resp 18 | Wt 123.8 lb

## 2021-04-22 DIAGNOSIS — M8580 Other specified disorders of bone density and structure, unspecified site: Secondary | ICD-10-CM | POA: Insufficient documentation

## 2021-04-22 DIAGNOSIS — Z170 Estrogen receptor positive status [ER+]: Secondary | ICD-10-CM

## 2021-04-22 DIAGNOSIS — C50411 Malignant neoplasm of upper-outer quadrant of right female breast: Secondary | ICD-10-CM | POA: Diagnosis not present

## 2021-04-22 DIAGNOSIS — Z7984 Long term (current) use of oral hypoglycemic drugs: Secondary | ICD-10-CM | POA: Diagnosis not present

## 2021-04-22 DIAGNOSIS — Z171 Estrogen receptor negative status [ER-]: Secondary | ICD-10-CM

## 2021-04-22 DIAGNOSIS — E1122 Type 2 diabetes mellitus with diabetic chronic kidney disease: Secondary | ICD-10-CM | POA: Insufficient documentation

## 2021-04-22 DIAGNOSIS — Z923 Personal history of irradiation: Secondary | ICD-10-CM | POA: Diagnosis not present

## 2021-04-22 DIAGNOSIS — Z79899 Other long term (current) drug therapy: Secondary | ICD-10-CM | POA: Insufficient documentation

## 2021-04-22 DIAGNOSIS — N189 Chronic kidney disease, unspecified: Secondary | ICD-10-CM | POA: Insufficient documentation

## 2021-04-22 DIAGNOSIS — M545 Low back pain, unspecified: Secondary | ICD-10-CM | POA: Diagnosis not present

## 2021-04-22 DIAGNOSIS — C50412 Malignant neoplasm of upper-outer quadrant of left female breast: Secondary | ICD-10-CM | POA: Diagnosis not present

## 2021-04-22 DIAGNOSIS — I129 Hypertensive chronic kidney disease with stage 1 through stage 4 chronic kidney disease, or unspecified chronic kidney disease: Secondary | ICD-10-CM | POA: Insufficient documentation

## 2021-04-22 DIAGNOSIS — Z853 Personal history of malignant neoplasm of breast: Secondary | ICD-10-CM | POA: Insufficient documentation

## 2021-04-22 LAB — CBC WITH DIFFERENTIAL (CANCER CENTER ONLY)
Abs Immature Granulocytes: 0.03 10*3/uL (ref 0.00–0.07)
Basophils Absolute: 0.1 10*3/uL (ref 0.0–0.1)
Basophils Relative: 1 %
Eosinophils Absolute: 0.1 10*3/uL (ref 0.0–0.5)
Eosinophils Relative: 1 %
HCT: 42.8 % (ref 36.0–46.0)
Hemoglobin: 14 g/dL (ref 12.0–15.0)
Immature Granulocytes: 0 %
Lymphocytes Relative: 7 %
Lymphs Abs: 0.5 10*3/uL — ABNORMAL LOW (ref 0.7–4.0)
MCH: 31 pg (ref 26.0–34.0)
MCHC: 32.7 g/dL (ref 30.0–36.0)
MCV: 94.7 fL (ref 80.0–100.0)
Monocytes Absolute: 0.8 10*3/uL (ref 0.1–1.0)
Monocytes Relative: 10 %
Neutro Abs: 6 10*3/uL (ref 1.7–7.7)
Neutrophils Relative %: 81 %
Platelet Count: 137 10*3/uL — ABNORMAL LOW (ref 150–400)
RBC: 4.52 MIL/uL (ref 3.87–5.11)
RDW: 13.7 % (ref 11.5–15.5)
WBC Count: 7.5 10*3/uL (ref 4.0–10.5)
nRBC: 0 % (ref 0.0–0.2)

## 2021-04-22 LAB — CMP (CANCER CENTER ONLY)
ALT: 16 U/L (ref 0–44)
AST: 19 U/L (ref 15–41)
Albumin: 4.2 g/dL (ref 3.5–5.0)
Alkaline Phosphatase: 104 U/L (ref 38–126)
Anion gap: 8 (ref 5–15)
BUN: 20 mg/dL (ref 8–23)
CO2: 28 mmol/L (ref 22–32)
Calcium: 9.7 mg/dL (ref 8.9–10.3)
Chloride: 104 mmol/L (ref 98–111)
Creatinine: 1.04 mg/dL — ABNORMAL HIGH (ref 0.44–1.00)
GFR, Estimated: 53 mL/min — ABNORMAL LOW (ref >60)
Glucose, Bld: 189 mg/dL — ABNORMAL HIGH (ref 70–99)
Potassium: 3.8 mmol/L (ref 3.5–5.1)
Sodium: 140 mmol/L (ref 135–145)
Total Bilirubin: 0.6 mg/dL (ref 0.3–1.2)
Total Protein: 7 g/dL (ref 6.5–8.1)

## 2021-04-23 ENCOUNTER — Telehealth: Payer: Self-pay | Admitting: Hematology

## 2021-04-23 NOTE — Telephone Encounter (Signed)
Scheduled follow-up appointment per 12/20 los. Patient is aware.

## 2021-04-24 ENCOUNTER — Telehealth (INDEPENDENT_AMBULATORY_CARE_PROVIDER_SITE_OTHER): Payer: Medicare PPO | Admitting: Adult Health

## 2021-04-24 ENCOUNTER — Other Ambulatory Visit: Payer: Self-pay | Admitting: Adult Health

## 2021-04-24 DIAGNOSIS — U071 COVID-19: Secondary | ICD-10-CM | POA: Diagnosis not present

## 2021-04-24 DIAGNOSIS — E139 Other specified diabetes mellitus without complications: Secondary | ICD-10-CM

## 2021-04-24 MED ORDER — MOLNUPIRAVIR EUA 200MG CAPSULE: 4.0000 | ORAL_CAPSULE | Freq: Two times a day (BID) | ORAL | 0 refills | Status: AC

## 2021-04-24 NOTE — Progress Notes (Signed)
Virtual Visit via Video Note  I connected with Adrienne [name] 04/24/21 at  1:00 PM EST by a video enabled telemedicine application and verified that I am speaking with the correct person using two identifiers.  Location patient: home Location provider:work or home office Persons participating in the virtual visit: patient, provider  I discussed the limitations of evaluation and management by telemedicine and the availability of in person appointments. The patient expressed understanding and agreed to proceed.   HPI:  86 year old female who tested positive for covid yesterday. Her symptoms started about 3 days ago. Symptoms include headache, runny nose, slight cough, and nasal congestion. Denies fevers, chills, shortness of breath or wheezing.   ROS: See pertinent positives and negatives per HPI.  Past Medical History:  Diagnosis Date   Allergy    Atrial fibrillation ([place])    Breast cancer ([place]) 08/25/12   Invasive ductal ca,DCIS   Diabetes mellitus    type II   Family history of cancer    Gout    Hearing loss    History of breast cancer    History of frequent urinary tract infections    History of radiation therapy 03/21/18- 04/19/18   Right Breast 2.67 Gy X 15 fractions, right breast boost 2 Gy X 5 fractions.    Hx of radiation therapy 11/22/12- 12/19/12   breast 4250 cGy 17 sessions, left breast boost 750 cGy 3 sessions   Hyperlipidemia    Hypertension    Osteopenia     Past Surgical History:  Procedure Laterality Date   ABDOMINAL HYSTERECTOMY Bilateral 1999   w/b/l salpingo-oopherectomy   APPENDECTOMY     BREAST LUMPECTOMY WITH NEEDLE LOCALIZATION AND AXILLARY SENTINEL LYMPH NODE BX Left 09/12/2012   Procedure: LEFT NEEDLE LOCALIZATION BREAST LUMPECTOMY TION AND AXILLARY SENTINEL LYMPH NODE BX;  Surgeon: Edward Jolly, MD;  Location: [facility];  Service: General;  Laterality: Left;   BREAST LUMPECTOMY WITH RADIOACTIVE SEED AND SENTINEL LYMPH NODE BIOPSY Right 10/26/2017    Procedure: BREAST LUMPECTOMY WITH RADIOACTIVE SEED AND SENTINEL LYMPH NODE BIOPSY;  Surgeon: Excell Seltzer, MD;  Location: [place];  Service: General;  Laterality: Right;   BREAST SURGERY  1990's   right- fibroid cyst   CHOLECYSTECTOMY     SEPTOPLASTY     TONSILLECTOMY      Family History  Problem Relation Age of Onset   Hypertension Mother    Heart disease Mother        Murmur and irregular heart disease   Dementia Mother        d. 73   Hypertension Father    Aneurysm Father 7   Breast cancer Cousin 15       mat first cousin       Current Outpatient Medications:    Accu-Chek Softclix Lancets lancets, Use as instructed, Disp: 200 each, Rfl: 12   allopurinol (ZYLOPRIM) 300 MG tablet, TAKE 1 TABLET BY MOUTH EVERY DAY, Disp: 90 tablet, Rfl: 3   atenolol (TENORMIN) 25 MG tablet, TAKE 1 TABLET BY MOUTH EVERY DAY, Disp: 90 tablet, Rfl: 3   Blood Glucose Monitoring Suppl (BLOOD GLUCOSE MONITOR SYSTEM) w/Device KIT, Accu Check Aviva Meter, Disp: 1 kit, Rfl: 0   chlorpheniramine (CHLOR-TRIMETON) 4 MG tablet, Take 4 mg 2 (two) times daily as needed by mouth for allergies., Disp: , Rfl:    diltiazem (CARDIZEM CD) 360 MG 24 hr capsule, TAKE 1 CAPSULE BY MOUTH EVERY DAY, Disp: 90 capsule, Rfl: 3  ELIQUIS 2.5 MG TABS tablet, TAKE 1 TABLET BY MOUTH TWICE A DAY, Disp: 180 tablet, Rfl: 1   fish oil-omega-3 fatty acids 1000 MG capsule, Take 1 g by mouth daily., Disp: , Rfl:    glucose blood (ACCU-CHEK AVIVA PLUS) test strip, USE TO TEST TWICE DAILY **ICD-10 CODE E13.9**, Disp: 200 each, Rfl: 3   metFORMIN (GLUCOPHAGE) 500 MG tablet, TAKE 1 TABLET (500 MG TOTAL) BY MOUTH 2 (TWO) TIMES DAILY. NEEDS OFFICE VISIT, Disp: 60 tablet, Rfl: 0   Multiple Vitamin (MULTIVITAMIN) tablet, Take 1 tablet by mouth daily., Disp: , Rfl:    nitrofurantoin, macrocrystal-monohydrate, (MACROBID) 100 MG capsule, Take 1 capsule (100 mg total) by mouth 2 (two) times daily., Disp: 13 capsule, Rfl:  0   OVER THE COUNTER MEDICATION, Take by mouth 2 (two) times daily. Presavision- for macular degeneration prevention, Disp: , Rfl:    phenazopyridine (PYRIDIUM) 95 MG tablet, Take 95 mg by mouth 3 (three) times daily as needed for pain., Disp: , Rfl:    rosuvastatin (CRESTOR) 5 MG tablet, TAKE 1 TABLET BY MOUTH EVERY DAY, Disp: 90 tablet, Rfl: 1   VITAMIN D, ERGOCALCIFEROL, PO, Take by mouth., Disp: , Rfl:   EXAM:  VITALS per patient if applicable:  GENERAL: alert, oriented, appears well and in no acute distress  HEENT: atraumatic, conjunttiva clear, no obvious abnormalities on inspection of external nose and ears  NECK: normal movements of the head and neck  LUNGS: on inspection no signs of respiratory distress, breathing rate appears normal, no obvious gross SOB, gasping or wheezing  CV: no obvious cyanosis  MS: moves all visible extremities without noticeable abnormality  PSYCH/NEURO: pleasant and cooperative, no obvious depression or anxiety, speech and thought processing grossly intact  ASSESSMENT AND PLAN:  Discussed the following assessment and plan:  1. COVID-19 virus infection - Due to age and health history recommend antiviral treatment. She would like to start antiviral treatment as well. Will send in molnupiravir.  - Red flags reviewed. Follow up as needed - molnupiravir EUA (LAGEVRIO) 200 mg CAPS capsule; Take 4 capsules (800 mg total) by mouth 2 (two) times daily for 5 days.  Dispense: 40 capsule; Refill: 0       I discussed the assessment and treatment plan with the patient. The patient was provided an opportunity to ask questions and all were answered. The patient agreed with the plan and demonstrated an understanding of the instructions.   The patient was advised to call back or seek an in-person evaluation if the symptoms worsen or if the condition fails to improve as anticipated.   [name], NP

## 2021-05-01 ENCOUNTER — Other Ambulatory Visit: Payer: Medicare PPO

## 2021-05-01 ENCOUNTER — Ambulatory Visit: Payer: Medicare PPO | Admitting: Nurse Practitioner

## 2021-05-16 ENCOUNTER — Other Ambulatory Visit: Payer: Self-pay | Admitting: Adult Health

## 2021-05-16 DIAGNOSIS — I4891 Unspecified atrial fibrillation: Secondary | ICD-10-CM | POA: Diagnosis not present

## 2021-05-16 DIAGNOSIS — E139 Other specified diabetes mellitus without complications: Secondary | ICD-10-CM

## 2021-05-16 DIAGNOSIS — M25552 Pain in left hip: Secondary | ICD-10-CM | POA: Diagnosis not present

## 2021-05-16 DIAGNOSIS — R2689 Other abnormalities of gait and mobility: Secondary | ICD-10-CM | POA: Diagnosis not present

## 2021-05-20 NOTE — Telephone Encounter (Signed)
Patient need to schedule an ov for more refills. 

## 2021-05-21 DIAGNOSIS — I4891 Unspecified atrial fibrillation: Secondary | ICD-10-CM | POA: Diagnosis not present

## 2021-05-21 DIAGNOSIS — R2689 Other abnormalities of gait and mobility: Secondary | ICD-10-CM | POA: Diagnosis not present

## 2021-05-21 DIAGNOSIS — M25552 Pain in left hip: Secondary | ICD-10-CM | POA: Diagnosis not present

## 2021-05-22 DIAGNOSIS — R2689 Other abnormalities of gait and mobility: Secondary | ICD-10-CM | POA: Diagnosis not present

## 2021-05-22 DIAGNOSIS — M25552 Pain in left hip: Secondary | ICD-10-CM | POA: Diagnosis not present

## 2021-05-22 DIAGNOSIS — I4891 Unspecified atrial fibrillation: Secondary | ICD-10-CM | POA: Diagnosis not present

## 2021-05-27 DIAGNOSIS — M25552 Pain in left hip: Secondary | ICD-10-CM | POA: Diagnosis not present

## 2021-05-27 DIAGNOSIS — I4891 Unspecified atrial fibrillation: Secondary | ICD-10-CM | POA: Diagnosis not present

## 2021-05-27 DIAGNOSIS — R2689 Other abnormalities of gait and mobility: Secondary | ICD-10-CM | POA: Diagnosis not present

## 2021-05-29 DIAGNOSIS — I4891 Unspecified atrial fibrillation: Secondary | ICD-10-CM | POA: Diagnosis not present

## 2021-05-29 DIAGNOSIS — M25552 Pain in left hip: Secondary | ICD-10-CM | POA: Diagnosis not present

## 2021-05-29 DIAGNOSIS — R2689 Other abnormalities of gait and mobility: Secondary | ICD-10-CM | POA: Diagnosis not present

## 2021-06-03 DIAGNOSIS — R2689 Other abnormalities of gait and mobility: Secondary | ICD-10-CM | POA: Diagnosis not present

## 2021-06-03 DIAGNOSIS — M25552 Pain in left hip: Secondary | ICD-10-CM | POA: Diagnosis not present

## 2021-06-03 DIAGNOSIS — I4891 Unspecified atrial fibrillation: Secondary | ICD-10-CM | POA: Diagnosis not present

## 2021-06-05 DIAGNOSIS — I4891 Unspecified atrial fibrillation: Secondary | ICD-10-CM | POA: Diagnosis not present

## 2021-06-05 DIAGNOSIS — R2689 Other abnormalities of gait and mobility: Secondary | ICD-10-CM | POA: Diagnosis not present

## 2021-06-05 DIAGNOSIS — M25552 Pain in left hip: Secondary | ICD-10-CM | POA: Diagnosis not present

## 2021-06-10 DIAGNOSIS — M25552 Pain in left hip: Secondary | ICD-10-CM | POA: Diagnosis not present

## 2021-06-10 DIAGNOSIS — R2689 Other abnormalities of gait and mobility: Secondary | ICD-10-CM | POA: Diagnosis not present

## 2021-06-10 DIAGNOSIS — I4891 Unspecified atrial fibrillation: Secondary | ICD-10-CM | POA: Diagnosis not present

## 2021-06-12 DIAGNOSIS — M25552 Pain in left hip: Secondary | ICD-10-CM | POA: Diagnosis not present

## 2021-06-12 DIAGNOSIS — I4891 Unspecified atrial fibrillation: Secondary | ICD-10-CM | POA: Diagnosis not present

## 2021-06-12 DIAGNOSIS — R2689 Other abnormalities of gait and mobility: Secondary | ICD-10-CM | POA: Diagnosis not present

## 2021-06-17 DIAGNOSIS — I4891 Unspecified atrial fibrillation: Secondary | ICD-10-CM | POA: Diagnosis not present

## 2021-06-17 DIAGNOSIS — R2689 Other abnormalities of gait and mobility: Secondary | ICD-10-CM | POA: Diagnosis not present

## 2021-06-17 DIAGNOSIS — M25552 Pain in left hip: Secondary | ICD-10-CM | POA: Diagnosis not present

## 2021-06-19 DIAGNOSIS — M25552 Pain in left hip: Secondary | ICD-10-CM | POA: Diagnosis not present

## 2021-06-19 DIAGNOSIS — R2689 Other abnormalities of gait and mobility: Secondary | ICD-10-CM | POA: Diagnosis not present

## 2021-06-19 DIAGNOSIS — I4891 Unspecified atrial fibrillation: Secondary | ICD-10-CM | POA: Diagnosis not present

## 2021-06-24 DIAGNOSIS — I4891 Unspecified atrial fibrillation: Secondary | ICD-10-CM | POA: Diagnosis not present

## 2021-06-24 DIAGNOSIS — R2689 Other abnormalities of gait and mobility: Secondary | ICD-10-CM | POA: Diagnosis not present

## 2021-06-24 DIAGNOSIS — M25552 Pain in left hip: Secondary | ICD-10-CM | POA: Diagnosis not present

## 2021-06-25 ENCOUNTER — Ambulatory Visit: Payer: Medicare PPO | Admitting: Adult Health

## 2021-06-25 ENCOUNTER — Encounter: Payer: Self-pay | Admitting: Adult Health

## 2021-06-25 VITALS — BP 120/82 | HR 90 | Temp 98.6°F | Ht 61.5 in | Wt 124.0 lb

## 2021-06-25 DIAGNOSIS — I10 Essential (primary) hypertension: Secondary | ICD-10-CM | POA: Diagnosis not present

## 2021-06-25 DIAGNOSIS — E7800 Pure hypercholesterolemia, unspecified: Secondary | ICD-10-CM

## 2021-06-25 DIAGNOSIS — I4821 Permanent atrial fibrillation: Secondary | ICD-10-CM | POA: Diagnosis not present

## 2021-06-25 DIAGNOSIS — R2689 Other abnormalities of gait and mobility: Secondary | ICD-10-CM | POA: Diagnosis not present

## 2021-06-25 DIAGNOSIS — Z0000 Encounter for general adult medical examination without abnormal findings: Secondary | ICD-10-CM | POA: Diagnosis not present

## 2021-06-25 DIAGNOSIS — E139 Other specified diabetes mellitus without complications: Secondary | ICD-10-CM

## 2021-06-25 DIAGNOSIS — M25552 Pain in left hip: Secondary | ICD-10-CM | POA: Diagnosis not present

## 2021-06-25 DIAGNOSIS — I4891 Unspecified atrial fibrillation: Secondary | ICD-10-CM | POA: Diagnosis not present

## 2021-06-25 DIAGNOSIS — Z853 Personal history of malignant neoplasm of breast: Secondary | ICD-10-CM | POA: Diagnosis not present

## 2021-06-25 DIAGNOSIS — E559 Vitamin D deficiency, unspecified: Secondary | ICD-10-CM | POA: Diagnosis not present

## 2021-06-25 DIAGNOSIS — M1A9XX0 Chronic gout, unspecified, without tophus (tophi): Secondary | ICD-10-CM | POA: Diagnosis not present

## 2021-06-25 LAB — CBC WITH DIFFERENTIAL/PLATELET
Basophils Absolute: 0.1 10*3/uL (ref 0.0–0.1)
Basophils Relative: 1.6 % (ref 0.0–3.0)
Eosinophils Absolute: 0.1 10*3/uL (ref 0.0–0.7)
Eosinophils Relative: 1.2 % (ref 0.0–5.0)
HCT: 41 % (ref 36.0–46.0)
Hemoglobin: 13.5 g/dL (ref 12.0–15.0)
Lymphocytes Relative: 21.5 % (ref 12.0–46.0)
Lymphs Abs: 1.8 10*3/uL (ref 0.7–4.0)
MCHC: 32.8 g/dL (ref 30.0–36.0)
MCV: 96.2 fl (ref 78.0–100.0)
Monocytes Absolute: 0.8 10*3/uL (ref 0.1–1.0)
Monocytes Relative: 10.3 % (ref 3.0–12.0)
Neutro Abs: 5.4 10*3/uL (ref 1.4–7.7)
Neutrophils Relative %: 65.4 % (ref 43.0–77.0)
Platelets: 177 10*3/uL (ref 150.0–400.0)
RBC: 4.26 Mil/uL (ref 3.87–5.11)
RDW: 14.4 % (ref 11.5–15.5)
WBC: 8.2 10*3/uL (ref 4.0–10.5)

## 2021-06-25 LAB — COMPREHENSIVE METABOLIC PANEL
ALT: 18 U/L (ref 0–35)
AST: 17 U/L (ref 0–37)
Albumin: 4.2 g/dL (ref 3.5–5.2)
Alkaline Phosphatase: 91 U/L (ref 39–117)
BUN: 27 mg/dL — ABNORMAL HIGH (ref 6–23)
CO2: 29 mEq/L (ref 19–32)
Calcium: 9.7 mg/dL (ref 8.4–10.5)
Chloride: 104 mEq/L (ref 96–112)
Creatinine, Ser: 1.07 mg/dL (ref 0.40–1.20)
GFR: 47.72 mL/min — ABNORMAL LOW (ref >60.00)
Glucose, Bld: 143 mg/dL — ABNORMAL HIGH (ref 70–99)
Potassium: 4.1 mEq/L (ref 3.5–5.1)
Sodium: 139 mEq/L (ref 135–145)
Total Bilirubin: 0.7 mg/dL (ref 0.2–1.2)
Total Protein: 7 g/dL (ref 6.0–8.3)

## 2021-06-25 LAB — LIPID PANEL
Cholesterol: 143 mg/dL (ref 0–200)
HDL: 79.8 mg/dL (ref >39.00)
LDL Cholesterol: 37 mg/dL (ref 0–99)
NonHDL: 63.05
Total CHOL/HDL Ratio: 2
Triglycerides: 131 mg/dL (ref 0.0–149.0)
VLDL: 26.2 mg/dL (ref 0.0–40.0)

## 2021-06-25 LAB — VITAMIN D 25 HYDROXY (VIT D DEFICIENCY, FRACTURES): VITD: 66.03 ng/mL (ref 30.00–100.00)

## 2021-06-25 LAB — HEMOGLOBIN A1C: Hgb A1c MFr Bld: 8.5 % — ABNORMAL HIGH (ref 4.6–6.5)

## 2021-06-25 LAB — TSH: TSH: 1.95 u[IU]/mL (ref 0.35–5.50)

## 2021-06-25 NOTE — Progress Notes (Signed)
Subjective:    Patient ID: Adrienne [name], female    DOB: 06/16/1934, 87 y.o.   MRN: [id number]  HPI Patient presents for yearly preventative medicine examination. She is a pleasant 87 year old female who  has a past medical history of Allergy, Atrial fibrillation ([place]), Breast cancer ([place]) (08/25/12), Diabetes mellitus, Family history of cancer, Gout, Hearing loss, History of breast cancer, History of frequent urinary tract infections, History of radiation therapy (03/21/18- 04/19/18), radiation therapy (11/22/12- 12/19/12), Hyperlipidemia, Hypertension, and Osteopenia.  DM Type 2 - she is currently prescribed Metformin 500 mg BID. She does not check her blood sugar on a routine basis.  She denies issues with hyperglycemia.  She does stay active, walks multiple times a week and tries to eat healthy. Lab Results  Component Value Date   HGBA1C 7.3 (A) 10/31/2019   Permanent Atrial Fibrillation - Is managed by Cardiology.  Currently rate controlled with Cardizem 360 mg daily and atenolol 25 mg daly. .  She is on chronic anticoagulation with Eliquis 2.5 mg.  She denies chest pain, shortness of breath, palpitations, or issues with bleeding  Hypertension-well controlled with Cardizem and atenolol.  She denies dizziness, lightheadedness, chest pain, or shortness of breath  Hyperlipidemia-currently prescribed Crestor 5 mg daily.  She denies myalgia or fatigue  History of chronic gout-takes allopurinol 300 mg daily.  Denies any recent gout flares  History of breast cancer-followed by oncology on a routine basis.  Denies new lumps/mass, nipple discharge, or skin changes.  Her last mammogram in July 2022 was negative for malignancy.  All immunizations and health maintenance protocols were reviewed with the patient and needed orders were placed.  Appropriate screening laboratory values were ordered for the patient including screening of hyperlipidemia, renal function and hepatic  function.   Medication reconciliation,  past medical history, social history, problem list and allergies were reviewed in detail with the patient  Goals were established with regard to weight loss, exercise, and  diet in compliance with medications Wt Readings from Last 3 Encounters:  06/25/21 124 lb (56.2 kg)  04/22/21 123 lb 12.8 oz (56.2 kg)  03/25/21 124 lb (56.2 kg)   Overall she feels very well in general.  Has no acute complaints.  Review of Systems  Constitutional: Negative.   HENT: Negative.    Eyes: Negative.   Respiratory: Negative.    Cardiovascular: Negative.   Gastrointestinal: Negative.   Endocrine: Negative.   Genitourinary: Negative.   Musculoskeletal:  Positive for arthralgias (chronic left hip).  Skin: Negative.   Allergic/Immunologic: Negative.   Neurological: Negative.   Hematological: Negative.   Psychiatric/Behavioral: Negative.    Past Medical History:  Diagnosis Date   Allergy    Atrial fibrillation ([place])    Breast cancer ([place]) 08/25/12   Invasive ductal ca,DCIS   Diabetes mellitus    type II   Family history of cancer    Gout    Hearing loss    History of breast cancer    History of frequent urinary tract infections    History of radiation therapy 03/21/18- 04/19/18   Right Breast 2.67 Gy X 15 fractions, right breast boost 2 Gy X 5 fractions.    Hx of radiation therapy 11/22/12- 12/19/12   breast 4250 cGy 17 sessions, left breast boost 750 cGy 3 sessions   Hyperlipidemia    Hypertension    Osteopenia     Social History   Socioeconomic History   Marital status: Married    Spouse  name: Not on file   Number of children: 3   Years of education: Not on file   Highest education level: Not on file  Occupational History   Not on file  Tobacco Use   Smoking status: Former    Packs/day: 0.25    Years: 4.00    Pack years: 1.00    Types: Cigarettes   Smokeless tobacco: Never   Tobacco comments:    Cigarette use was 50 years ago  Vaping  Use   Vaping Use: Never used  Substance and Sexual Activity   Alcohol use: Not Currently   Drug use: No   Sexual activity: Not Currently    Comment: menarche age 80, [birth year]st birth age 89, G75, HRT x 7 years?  Other Topics Concern   Not on file  Social History Narrative   Married - husband in memory care unit    Three children ( One lives at [place] and two in [place]      She likes to read and go to [employer]    Social Determinants of Health   Financial Resource Strain: Low Risk    Difficulty of Paying Living Expenses: Not hard at all  Food Insecurity: No Food Insecurity   Worried About Charity fundraiser in the Last Year: Never true   Arboriculturist in the Last Year: Never true  Transportation Needs: No Transportation Needs   Lack of Transportation (Medical): No   Lack of Transportation (Non-Medical): No  Physical Activity: Sufficiently Active   Days of Exercise per Week: 7 days   Minutes of Exercise per Session: 30 min  Stress: No Stress Concern Present   Feeling of Stress : Not at all  Social Connections: Not on file  Intimate Partner Violence: Not on file    Past Surgical History:  Procedure Laterality Date   ABDOMINAL HYSTERECTOMY Bilateral 1999   w/b/l salpingo-oopherectomy   APPENDECTOMY     BREAST LUMPECTOMY WITH NEEDLE LOCALIZATION AND AXILLARY SENTINEL LYMPH NODE BX Left 09/12/2012   Procedure: LEFT NEEDLE LOCALIZATION BREAST LUMPECTOMY TION AND AXILLARY SENTINEL LYMPH NODE BX;  Surgeon: Edward Jolly, MD;  Location: [place];  Service: General;  Laterality: Left;   BREAST LUMPECTOMY WITH RADIOACTIVE SEED AND SENTINEL LYMPH NODE BIOPSY Right 10/26/2017   Procedure: BREAST LUMPECTOMY WITH RADIOACTIVE SEED AND SENTINEL LYMPH NODE BIOPSY;  Surgeon: Excell Seltzer, MD;  Location: [place];  Service: General;  Laterality: Right;   BREAST SURGERY  1990's   right- fibroid cyst   CHOLECYSTECTOMY     SEPTOPLASTY     TONSILLECTOMY      Family  History  Problem Relation Age of Onset   Hypertension Mother    Heart disease Mother        Murmur and irregular heart disease   Dementia Mother        d. 3   Hypertension Father    Aneurysm Father 57   Breast cancer Cousin 85       mat first cousin    Allergies  Allergen Reactions   Penicillins Hives    Has patient had a PCN reaction causing immediate rash, facial/tongue/throat swelling, SOB or lightheadedness with hypotension: No Has patient had a PCN reaction causing severe rash involving mucus membranes or skin necrosis: No Has patient had a PCN reaction that required hospitalization: No Has patient had a PCN reaction occurring within the last 10 years: No If all of the above answers are "NO", then may  proceed with Cephalosporin use.   Other Hives   Prednisone Other (See Comments)    ELEVATED BLOOD SUGAR   Radiaplexrx [Wound Dressings] Hives   Sulfamethoxazole Nausea Only    Current Outpatient Medications on File Prior to Visit  Medication Sig Dispense Refill   Accu-Chek Softclix Lancets lancets Use as instructed 200 each 12   allopurinol (ZYLOPRIM) 300 MG tablet TAKE 1 TABLET BY MOUTH EVERY DAY 90 tablet 3   atenolol (TENORMIN) 25 MG tablet TAKE 1 TABLET BY MOUTH EVERY DAY 90 tablet 3   Blood Glucose Monitoring Suppl (BLOOD GLUCOSE MONITOR SYSTEM) w/Device KIT Accu Check Aviva Meter 1 kit 0   chlorpheniramine (CHLOR-TRIMETON) 4 MG tablet Take 4 mg 2 (two) times daily as needed by mouth for allergies.     diltiazem (CARDIZEM CD) 360 MG 24 hr capsule TAKE 1 CAPSULE BY MOUTH EVERY DAY 90 capsule 3   ELIQUIS 2.5 MG TABS tablet TAKE 1 TABLET BY MOUTH TWICE A DAY 180 tablet 1   fish oil-omega-3 fatty acids 1000 MG capsule Take 1 g by mouth daily.     glucose blood (ACCU-CHEK AVIVA PLUS) test strip USE TO TEST TWICE DAILY **ICD-10 CODE E13.9** 200 each 3   metFORMIN (GLUCOPHAGE) 500 MG tablet TAKE 1 TABLET (500 MG TOTAL) BY MOUTH 2 (TWO) TIMES DAILY. NEEDS OFFICE VISIT 60 tablet  0   Multiple Vitamin (MULTIVITAMIN) tablet Take 1 tablet by mouth daily.     OVER THE COUNTER MEDICATION Take by mouth 2 (two) times daily. Presavision- for macular degeneration prevention     phenazopyridine (PYRIDIUM) 95 MG tablet Take 95 mg by mouth 3 (three) times daily as needed for pain.     rosuvastatin (CRESTOR) 5 MG tablet TAKE 1 TABLET BY MOUTH EVERY DAY 90 tablet 1   VITAMIN D, ERGOCALCIFEROL, PO Take by mouth.     No current facility-administered medications on file prior to visit.    BP 120/82    Pulse 90    Temp 98.6 F (37 C) (Oral)    Ht 5' 1.5" (1.562 m)    Wt 124 lb (56.2 kg)    SpO2 95%    BMI 23.05 kg/m       Objective:   Physical Exam Vitals and nursing note reviewed.  Constitutional:      Appearance: Normal appearance.  HENT:     Right Ear: Tympanic membrane, ear canal and external ear normal. There is no impacted cerumen.     Left Ear: Tympanic membrane, ear canal and external ear normal. There is no impacted cerumen.     Nose: Nose normal. No congestion or rhinorrhea.     Mouth/Throat:     Mouth: Mucous membranes are moist.     Pharynx: Oropharynx is clear. No oropharyngeal exudate or posterior oropharyngeal erythema.  Eyes:     Extraocular Movements: Extraocular movements intact.     Conjunctiva/sclera: Conjunctivae normal.     Pupils: Pupils are equal, round, and reactive to light.  Cardiovascular:     Pulses: Normal pulses.     Heart sounds: Normal heart sounds.  Pulmonary:     Effort: Pulmonary effort is normal.     Breath sounds: Normal breath sounds.  Abdominal:     General: Abdomen is flat. Bowel sounds are normal.     Palpations: Abdomen is soft.  Musculoskeletal:        General: Normal range of motion.  Skin:    General: Skin is warm and dry.  Capillary Refill: Capillary refill takes less than 2 seconds.  Neurological:     General: No focal deficit present.     Mental Status: She is alert and oriented to person, place, and time.   Psychiatric:        Mood and Affect: Mood normal.        Behavior: Behavior normal.        Thought Content: Thought content normal.        Judgment: Judgment normal.      Assessment & Plan:  1. Routine general medical examination at a health care facility - Remarkable 87 year old female.  - Continue to stay active with exercise and eat a heart healthy diet  - Follow up in one year for CPE or sooner if needed - CBC with Differential/Platelet; Future - Comprehensive metabolic panel - Hemoglobin A1c; Future - Lipid panel; Future - TSH; Future - TSH - Lipid panel - Hemoglobin A1c - CBC with Differential/Platelet  2. Pure hypercholesterolemia - Consider increase in statin  - CBC with Differential/Platelet; Future - Comprehensive metabolic panel - Hemoglobin A1c; Future - Lipid panel; Future - TSH; Future - TSH - Lipid panel - Hemoglobin A1c - CBC with Differential/Platelet  3. Essential hypertension - Well controlled. No change in medications  - CBC with Differential/Platelet; Future - Comprehensive metabolic panel - Hemoglobin A1c; Future - Lipid panel; Future - TSH; Future - TSH - Lipid panel - Hemoglobin A1c - CBC with Differential/Platelet  4. Permanent atrial fibrillation ([place]) - Per Cardiology  - CBC with Differential/Platelet; Future - Comprehensive metabolic panel - Hemoglobin A1c; Future - Lipid panel; Future - TSH; Future - TSH - Lipid panel - Hemoglobin A1c - CBC with Differential/Platelet  5. Chronic gout without tophus, unspecified cause, unspecified site - Continue Allopurinol 300 mg daily   6. Vitamin D deficiency  - VITAMIN D 25 Hydroxy (Vit-D Deficiency, Fractures); Future - VITAMIN D 25 Hydroxy (Vit-D Deficiency, Fractures)  7. Diabetes 1.5, managed as type 2 ([place]) - consider increase in metformin  - Likely follow up  in 6 months  - CBC with Differential/Platelet; Future - Comprehensive metabolic panel - Hemoglobin A1c; Future -  Lipid panel; Future - TSH; Future - TSH - Lipid panel - Hemoglobin A1c - CBC with Differential/Platelet  8. History of breast cancer - Follow up with oncology as directed  [name], NP

## 2021-06-25 NOTE — Patient Instructions (Signed)
It was great seeing you today   We will follow up with you regarding your lab work   Please let me know if you need anything   

## 2021-06-26 ENCOUNTER — Other Ambulatory Visit: Payer: Self-pay | Admitting: Adult Health

## 2021-06-26 DIAGNOSIS — L814 Other melanin hyperpigmentation: Secondary | ICD-10-CM | POA: Diagnosis not present

## 2021-06-26 DIAGNOSIS — D1801 Hemangioma of skin and subcutaneous tissue: Secondary | ICD-10-CM | POA: Diagnosis not present

## 2021-06-26 DIAGNOSIS — Z85828 Personal history of other malignant neoplasm of skin: Secondary | ICD-10-CM | POA: Diagnosis not present

## 2021-06-26 DIAGNOSIS — L820 Inflamed seborrheic keratosis: Secondary | ICD-10-CM | POA: Diagnosis not present

## 2021-06-26 DIAGNOSIS — L570 Actinic keratosis: Secondary | ICD-10-CM | POA: Diagnosis not present

## 2021-06-26 DIAGNOSIS — C44219 Basal cell carcinoma of skin of left ear and external auricular canal: Secondary | ICD-10-CM | POA: Diagnosis not present

## 2021-06-26 DIAGNOSIS — D229 Melanocytic nevi, unspecified: Secondary | ICD-10-CM | POA: Diagnosis not present

## 2021-06-26 DIAGNOSIS — M1A9XX0 Chronic gout, unspecified, without tophus (tophi): Secondary | ICD-10-CM

## 2021-06-26 DIAGNOSIS — L578 Other skin changes due to chronic exposure to nonionizing radiation: Secondary | ICD-10-CM | POA: Diagnosis not present

## 2021-06-26 DIAGNOSIS — L821 Other seborrheic keratosis: Secondary | ICD-10-CM | POA: Diagnosis not present

## 2021-06-26 DIAGNOSIS — E139 Other specified diabetes mellitus without complications: Secondary | ICD-10-CM

## 2021-06-26 MED ORDER — ALLOPURINOL 300 MG PO TABS: 300.0000 mg | ORAL_TABLET | Freq: Every day | ORAL | 3 refills | Status: DC

## 2021-06-26 MED ORDER — ROSUVASTATIN CALCIUM 5 MG PO TABS: 5.0000 mg | ORAL_TABLET | Freq: Every day | ORAL | 3 refills | Status: DC

## 2021-06-26 MED ORDER — METFORMIN HCL 1000 MG PO TABS: 1000.0000 mg | ORAL_TABLET | Freq: Two times a day (BID) | ORAL | 0 refills | Status: DC

## 2021-06-30 DIAGNOSIS — I10 Essential (primary) hypertension: Secondary | ICD-10-CM | POA: Diagnosis not present

## 2021-06-30 DIAGNOSIS — E119 Type 2 diabetes mellitus without complications: Secondary | ICD-10-CM | POA: Diagnosis not present

## 2021-06-30 DIAGNOSIS — D6869 Other thrombophilia: Secondary | ICD-10-CM | POA: Diagnosis not present

## 2021-06-30 DIAGNOSIS — M109 Gout, unspecified: Secondary | ICD-10-CM | POA: Diagnosis not present

## 2021-06-30 DIAGNOSIS — J302 Other seasonal allergic rhinitis: Secondary | ICD-10-CM | POA: Diagnosis not present

## 2021-06-30 DIAGNOSIS — I4891 Unspecified atrial fibrillation: Secondary | ICD-10-CM | POA: Diagnosis not present

## 2021-06-30 DIAGNOSIS — M1990 Unspecified osteoarthritis, unspecified site: Secondary | ICD-10-CM | POA: Diagnosis not present

## 2021-06-30 DIAGNOSIS — G8929 Other chronic pain: Secondary | ICD-10-CM | POA: Diagnosis not present

## 2021-06-30 DIAGNOSIS — E785 Hyperlipidemia, unspecified: Secondary | ICD-10-CM | POA: Diagnosis not present

## 2021-07-02 DIAGNOSIS — R2689 Other abnormalities of gait and mobility: Secondary | ICD-10-CM | POA: Diagnosis not present

## 2021-07-02 DIAGNOSIS — I4891 Unspecified atrial fibrillation: Secondary | ICD-10-CM | POA: Diagnosis not present

## 2021-07-02 DIAGNOSIS — M25552 Pain in left hip: Secondary | ICD-10-CM | POA: Diagnosis not present

## 2021-07-08 DIAGNOSIS — M25552 Pain in left hip: Secondary | ICD-10-CM | POA: Diagnosis not present

## 2021-07-08 DIAGNOSIS — I4891 Unspecified atrial fibrillation: Secondary | ICD-10-CM | POA: Diagnosis not present

## 2021-07-08 DIAGNOSIS — R2689 Other abnormalities of gait and mobility: Secondary | ICD-10-CM | POA: Diagnosis not present

## 2021-07-10 DIAGNOSIS — I4891 Unspecified atrial fibrillation: Secondary | ICD-10-CM | POA: Diagnosis not present

## 2021-07-10 DIAGNOSIS — R2689 Other abnormalities of gait and mobility: Secondary | ICD-10-CM | POA: Diagnosis not present

## 2021-07-10 DIAGNOSIS — M25552 Pain in left hip: Secondary | ICD-10-CM | POA: Diagnosis not present

## 2021-07-15 DIAGNOSIS — I4891 Unspecified atrial fibrillation: Secondary | ICD-10-CM | POA: Diagnosis not present

## 2021-07-15 DIAGNOSIS — M25552 Pain in left hip: Secondary | ICD-10-CM | POA: Diagnosis not present

## 2021-07-15 DIAGNOSIS — R2689 Other abnormalities of gait and mobility: Secondary | ICD-10-CM | POA: Diagnosis not present

## 2021-07-17 DIAGNOSIS — M25552 Pain in left hip: Secondary | ICD-10-CM | POA: Diagnosis not present

## 2021-07-17 DIAGNOSIS — R2689 Other abnormalities of gait and mobility: Secondary | ICD-10-CM | POA: Diagnosis not present

## 2021-07-17 DIAGNOSIS — I4891 Unspecified atrial fibrillation: Secondary | ICD-10-CM | POA: Diagnosis not present

## 2021-07-22 DIAGNOSIS — M25552 Pain in left hip: Secondary | ICD-10-CM | POA: Diagnosis not present

## 2021-07-22 DIAGNOSIS — R2689 Other abnormalities of gait and mobility: Secondary | ICD-10-CM | POA: Diagnosis not present

## 2021-07-22 DIAGNOSIS — I4891 Unspecified atrial fibrillation: Secondary | ICD-10-CM | POA: Diagnosis not present

## 2021-07-24 DIAGNOSIS — R2689 Other abnormalities of gait and mobility: Secondary | ICD-10-CM | POA: Diagnosis not present

## 2021-07-24 DIAGNOSIS — M25552 Pain in left hip: Secondary | ICD-10-CM | POA: Diagnosis not present

## 2021-07-24 DIAGNOSIS — I4891 Unspecified atrial fibrillation: Secondary | ICD-10-CM | POA: Diagnosis not present

## 2021-07-28 DIAGNOSIS — R2689 Other abnormalities of gait and mobility: Secondary | ICD-10-CM | POA: Diagnosis not present

## 2021-07-28 DIAGNOSIS — I4891 Unspecified atrial fibrillation: Secondary | ICD-10-CM | POA: Diagnosis not present

## 2021-07-28 DIAGNOSIS — M25552 Pain in left hip: Secondary | ICD-10-CM | POA: Diagnosis not present

## 2021-07-31 DIAGNOSIS — R2689 Other abnormalities of gait and mobility: Secondary | ICD-10-CM | POA: Diagnosis not present

## 2021-07-31 DIAGNOSIS — M25552 Pain in left hip: Secondary | ICD-10-CM | POA: Diagnosis not present

## 2021-07-31 DIAGNOSIS — I4891 Unspecified atrial fibrillation: Secondary | ICD-10-CM | POA: Diagnosis not present

## 2021-08-04 DIAGNOSIS — M25552 Pain in left hip: Secondary | ICD-10-CM | POA: Diagnosis not present

## 2021-08-04 DIAGNOSIS — R2689 Other abnormalities of gait and mobility: Secondary | ICD-10-CM | POA: Diagnosis not present

## 2021-08-04 DIAGNOSIS — I4891 Unspecified atrial fibrillation: Secondary | ICD-10-CM | POA: Diagnosis not present

## 2021-08-08 DIAGNOSIS — M25552 Pain in left hip: Secondary | ICD-10-CM | POA: Diagnosis not present

## 2021-08-08 DIAGNOSIS — I4891 Unspecified atrial fibrillation: Secondary | ICD-10-CM | POA: Diagnosis not present

## 2021-08-08 DIAGNOSIS — R2689 Other abnormalities of gait and mobility: Secondary | ICD-10-CM | POA: Diagnosis not present

## 2021-08-11 DIAGNOSIS — M25552 Pain in left hip: Secondary | ICD-10-CM | POA: Diagnosis not present

## 2021-08-11 DIAGNOSIS — I4891 Unspecified atrial fibrillation: Secondary | ICD-10-CM | POA: Diagnosis not present

## 2021-08-11 DIAGNOSIS — R2689 Other abnormalities of gait and mobility: Secondary | ICD-10-CM | POA: Diagnosis not present

## 2021-08-22 ENCOUNTER — Other Ambulatory Visit: Payer: Self-pay | Admitting: Nurse Practitioner

## 2021-09-02 DIAGNOSIS — Z961 Presence of intraocular lens: Secondary | ICD-10-CM | POA: Diagnosis not present

## 2021-09-02 DIAGNOSIS — Z9849 Cataract extraction status, unspecified eye: Secondary | ICD-10-CM | POA: Diagnosis not present

## 2021-09-02 DIAGNOSIS — I10 Essential (primary) hypertension: Secondary | ICD-10-CM | POA: Diagnosis not present

## 2021-09-02 DIAGNOSIS — H40059 Ocular hypertension, unspecified eye: Secondary | ICD-10-CM | POA: Diagnosis not present

## 2021-09-02 DIAGNOSIS — E7800 Pure hypercholesterolemia, unspecified: Secondary | ICD-10-CM | POA: Diagnosis not present

## 2021-09-02 DIAGNOSIS — H524 Presbyopia: Secondary | ICD-10-CM | POA: Diagnosis not present

## 2021-09-02 DIAGNOSIS — H5213 Myopia, bilateral: Secondary | ICD-10-CM | POA: Diagnosis not present

## 2021-09-02 DIAGNOSIS — H52223 Regular astigmatism, bilateral: Secondary | ICD-10-CM | POA: Diagnosis not present

## 2021-09-02 LAB — HM DIABETES EYE EXAM

## 2021-09-09 ENCOUNTER — Encounter: Payer: Self-pay | Admitting: Adult Health

## 2021-09-11 ENCOUNTER — Other Ambulatory Visit: Payer: Self-pay | Admitting: Adult Health

## 2021-09-11 DIAGNOSIS — E139 Other specified diabetes mellitus without complications: Secondary | ICD-10-CM

## 2021-09-23 ENCOUNTER — Ambulatory Visit: Payer: Medicare PPO | Admitting: Adult Health

## 2021-09-23 ENCOUNTER — Encounter: Payer: Self-pay | Admitting: Adult Health

## 2021-09-23 VITALS — BP 130/82 | HR 76 | Temp 98.0°F | Ht 61.0 in | Wt 122.0 lb

## 2021-09-23 DIAGNOSIS — I10 Essential (primary) hypertension: Secondary | ICD-10-CM | POA: Diagnosis not present

## 2021-09-23 DIAGNOSIS — E139 Other specified diabetes mellitus without complications: Secondary | ICD-10-CM

## 2021-09-23 MED ORDER — METFORMIN HCL 500 MG PO TABS
500.0000 mg | ORAL_TABLET | Freq: Two times a day (BID) | ORAL | 3 refills | Status: DC
Start: 2021-09-23 — End: 2024-03-07

## 2021-09-23 MED ORDER — BLOOD GLUCOSE MONITOR KIT: PACK | 0 refills | Status: AC

## 2021-09-23 NOTE — Progress Notes (Unsigned)
Subjective:    Patient ID: Adrienne [name], female    DOB: 07/14/1934, 87 y.o.   MRN: [id number]  HPI  87 year old female who  has a past medical history of Allergy, Atrial fibrillation ([place]), Breast cancer ([place]) (08/25/12), Diabetes mellitus, Family history of cancer, Gout, Hearing loss, History of breast cancer, History of frequent urinary tract infections, History of radiation therapy (03/21/18- 04/19/18), radiation therapy (11/22/12- 12/19/12), Hyperlipidemia, Hypertension, and Osteopenia.  She presents to the office today for 3-monthfollow-up regarding diabetes mellitus and hypertension   Diabetes mellitus type 2-currently managed with metformin 1000 mg twice daily.  She does not check her blood sugars at home on a routine basis.  She denies issues with hypoglycemia.  She does stay active, walks multiple times a week and tries to eat healthy.  During last visit 3 months ago her A1c had increased from baseline around 7.3-8.5; at this time Metformin was increased from Metformin 500 mg BID to 1000 mg BID. She reports that the increased dose of Metformin caused GI issues and she has not been able to take it every day.   She has been working on diet    She needs a new glucometer. Hers has not been working   LMaterials engineerValue Date   HGBA1C 8.5 (H) 06/25/2021   Hypertension-well controlled with Cardizem 360 and atenolol 5 mg daily.  She denies dizziness, lightheadedness, chest pain, or shortness of breath BP Readings from Last 3 Encounters:  09/23/21 130/82  06/25/21 120/82  04/22/21 (!) 140/100    Review of Systems See HPI   Past Medical History:  Diagnosis Date   Allergy    [place] ([place]    Breast [place] ([place] 08/[place]12   Invasive ductal ca,DCIS   Diabetes mellitus    type II   Family history of cancer    Gout    Hearing loss    History of breast cancer    History of frequent urinary tract infections    History of radiation therapy 03/21/18- 04/19/18    Right Breast 2.67 Gy X 15 fractions, right breast boost 2 Gy X 5 fractions.    Hx of radiation therapy 11/22/12- 12/19/12   breast 4250 cGy 17 sessions, left breast boost 750 cGy 3 sessions   Hyperlipidemia    Hypertension    Osteopenia     Social History   Socioeconomic History   Marital status: Married    Spouse name: Not on file   Number of children: 3   Years of education: Not on file   Highest education level: Not on file  Occupational History   Not on file  Tobacco Use   Smoking status: Former    Packs/day: 0.25    Years: 4.00    Pack years: 1.00    Types: Cigarettes   Smokeless tobacco: Never   Tobacco comments:    Cigarette use was 50 years ago  Vaping Use   Vaping Use: Never used  Substance and Sexual Activity   Alcohol use: Not Currently   Drug use: No   Sexual activity: Not Currently    Comment: menarche age 87 171stbirth age 87 GG61 HRT x 7 years?  Other Topics Concern   Not on file  Social History Narrative   Married - husband in memory care unit    Three [place] ( [place]     She likes to read and [place]    Social Determinants of Health   Financial Resource Strain: Low Risk    Difficulty of Paying Living Expenses: Not hard at all  Food Insecurity: No Food Insecurity   Worried About Charity fundraiser in the Last Year: Never true   Arboriculturist in the Last Year: Never true  Transportation Needs: No Transportation Needs   Lack of Transportation (Medical): No   Lack of Transportation (Non-Medical): No  Physical Activity: Sufficiently Active   Days of Exercise per Week: 7 days   Minutes of Exercise per Session: 30 min  Stress: No Stress Concern Present   Feeling of Stress : Not at all  Social Connections: Not on file  Intimate Partner Violence: Not on file    Past Surgical History:  Procedure Laterality Date   ABDOMINAL HYSTERECTOMY Bilateral 1999   w/b/l salpingo-oopherectomy   APPENDECTOMY      BREAST LUMPECTOMY WITH NEEDLE LOCALIZATION AND AXILLARY SENTINEL LYMPH NODE BX Left 09/12/2012   Procedure: LEFT NEEDLE LOCALIZATION BREAST LUMPECTOMY TION AND AXILLARY SENTINEL LYMPH NODE BX;  Surgeon: Edward Jolly, MD;  Location: [facility];  Service: General;  Laterality: Left;   BREAST LUMPECTOMY WITH RADIOACTIVE SEED AND SENTINEL LYMPH NODE BIOPSY Right 10/26/2017   Procedure: BREAST LUMPECTOMY WITH RADIOACTIVE SEED AND SENTINEL LYMPH NODE BIOPSY;  Surgeon: Excell Seltzer, MD;  Location: [place];  Service: General;  Laterality: Right;   BREAST SURGERY  1990's   right- fibroid cyst   CHOLECYSTECTOMY     SEPTOPLASTY     TONSILLECTOMY      Family History  Problem Relation Age of Onset   Hypertension Mother    Heart disease Mother        Murmur and irregular heart disease   Dementia Mother        d. 39   Hypertension Father    Aneurysm Father 6   Breast cancer Cousin 35       mat first cousin    Allergies  Allergen Reactions   Penicillins Hives    Has patient had a PCN reaction causing immediate rash, facial/tongue/throat swelling, SOB or lightheadedness with hypotension: No Has patient had a PCN reaction causing severe rash involving mucus membranes or skin necrosis: No Has patient had a PCN reaction that required hospitalization: No Has patient had a PCN reaction occurring within the last 10 years: No If all of the above answers are "NO", then may proceed with Cephalosporin use.   Other Hives   Prednisone Other (See Comments)    ELEVATED BLOOD SUGAR   Radiaplexrx [Wound Dressings] Hives   Sulfamethoxazole Nausea Only    Current Outpatient Medications on File Prior to Visit  Medication Sig Dispense Refill   allopurinol (ZYLOPRIM) 300 MG tablet Take 1 tablet (300 mg total) by mouth daily. 90 tablet 3   atenolol (TENORMIN) 25 MG tablet TAKE 1 TABLET BY MOUTH EVERY DAY 90 tablet 3   chlorpheniramine (CHLOR-TRIMETON) 4 MG tablet Take 4 mg 2 (two) times  daily as needed by mouth for allergies.     diltiazem (CARDIZEM CD) 360 MG 24 hr capsule TAKE 1 CAPSULE BY MOUTH EVERY DAY 90 capsule 3   ELIQUIS 2.5 MG TABS tablet TAKE 1 TABLET BY MOUTH TWICE A DAY 180 tablet 1   fish oil-omega-3 fatty acids 1000 MG capsule Take 1 g by mouth daily.     Multiple Vitamin (MULTIVITAMIN) tablet Take 1 tablet by mouth daily.  OVER THE COUNTER MEDICATION Take by mouth 2 (two) times daily. Presavision- for macular degeneration prevention     phenazopyridine (PYRIDIUM) 95 MG tablet Take 95 mg by mouth 3 (three) times daily as needed for pain.     rosuvastatin (CRESTOR) 5 MG tablet Take 1 tablet (5 mg total) by mouth daily. 90 tablet 3   VITAMIN D, ERGOCALCIFEROL, PO Take by mouth.     [DISCONTINUED] metFORMIN (GLUCOPHAGE) 1000 MG tablet TAKE 1 TABLET (1,000 MG TOTAL) BY MOUTH 2 (TWO) TIMES DAILY. NEEDS OFFICE VISIT 180 tablet 0   No current facility-administered medications on file prior to visit.    BP 130/82   Pulse 76   Temp 98 F (36.7 C) (Oral)   Ht 5' 1" (1.549 m)   Wt 122 lb (55.3 kg)   SpO2 94%   BMI 23.05 kg/m       Objective:   Physical Exam Vitals and nursing note reviewed.  Constitutional:      Appearance: Normal appearance.  Cardiovascular:     Rate and Rhythm: Normal rate and regular rhythm.     Pulses: Normal pulses.     Heart sounds: Normal heart sounds.  Pulmonary:     Effort: Pulmonary effort is normal.     Breath sounds: Normal breath sounds.  Musculoskeletal:        General: Normal range of motion.  Skin:    General: Skin is warm and dry.     Capillary Refill: Capillary refill takes less than 2 seconds.  Neurological:     General: No focal deficit present.     Mental Status: She is alert and oriented to person, place, and time.  Psychiatric:        Mood and Affect: Mood normal.        Behavior: Behavior normal.        Thought Content: Thought content normal.        Judgment: Judgment normal.     Assessment & Plan:   1. Diabetes 1.5, managed as type 2 ([place]) *** - blood glucose meter kit and supplies KIT; Dispense based on patient and insurance preference. Use up to four times daily as directed.  Dispense: 1 each; Refill: 0 - metFORMIN (GLUCOPHAGE) 500 MG tablet; Take 1 tablet (500 mg total) by mouth 2 (two) times daily with a meal.  Dispense: 180 tablet; Refill: 3 - POC HgB A1c- 7.3  2. Essential hypertension - Well controlled.  - No change in medications   [name], NP

## 2021-09-23 NOTE — Patient Instructions (Signed)
Your A1c has improved to 7.3   I am going to put you back on the Metformin 500 mg twice daily.   Follow up in 6 months

## 2021-09-24 ENCOUNTER — Other Ambulatory Visit: Payer: Self-pay | Admitting: Cardiology

## 2021-09-24 LAB — POCT GLYCOSYLATED HEMOGLOBIN (HGB A1C): Hemoglobin A1C: 7.3 % — AB (ref 4.0–5.6)

## 2021-09-24 NOTE — Telephone Encounter (Signed)
Prescription refill request for Eliquis received. Indication:Afib Last office visit:10/22 Scr:1.0 Age: 87 Weight:55.3 kg  Prescription refilled

## 2021-10-17 ENCOUNTER — Other Ambulatory Visit: Payer: Self-pay | Admitting: Adult Health

## 2021-10-21 ENCOUNTER — Inpatient Hospital Stay: Payer: Medicare PPO

## 2021-10-21 ENCOUNTER — Other Ambulatory Visit: Payer: Self-pay

## 2021-10-21 ENCOUNTER — Other Ambulatory Visit: Payer: Self-pay | Admitting: Nurse Practitioner

## 2021-10-21 ENCOUNTER — Inpatient Hospital Stay: Payer: Medicare PPO | Attending: Nurse Practitioner | Admitting: Nurse Practitioner

## 2021-10-21 ENCOUNTER — Telehealth: Payer: Self-pay

## 2021-10-21 ENCOUNTER — Other Ambulatory Visit: Payer: Self-pay | Admitting: Lab

## 2021-10-21 ENCOUNTER — Encounter: Payer: Self-pay | Admitting: Nurse Practitioner

## 2021-10-21 VITALS — BP 155/74 | HR 85 | Temp 97.9°F | Resp 17 | Wt 120.2 lb

## 2021-10-21 DIAGNOSIS — R8290 Unspecified abnormal findings in urine: Secondary | ICD-10-CM

## 2021-10-21 DIAGNOSIS — Z171 Estrogen receptor negative status [ER-]: Secondary | ICD-10-CM

## 2021-10-21 DIAGNOSIS — C50412 Malignant neoplasm of upper-outer quadrant of left female breast: Secondary | ICD-10-CM

## 2021-10-21 DIAGNOSIS — N189 Chronic kidney disease, unspecified: Secondary | ICD-10-CM | POA: Insufficient documentation

## 2021-10-21 DIAGNOSIS — R82998 Other abnormal findings in urine: Secondary | ICD-10-CM | POA: Insufficient documentation

## 2021-10-21 DIAGNOSIS — C50411 Malignant neoplasm of upper-outer quadrant of right female breast: Secondary | ICD-10-CM | POA: Insufficient documentation

## 2021-10-21 DIAGNOSIS — I129 Hypertensive chronic kidney disease with stage 1 through stage 4 chronic kidney disease, or unspecified chronic kidney disease: Secondary | ICD-10-CM | POA: Diagnosis not present

## 2021-10-21 DIAGNOSIS — E1122 Type 2 diabetes mellitus with diabetic chronic kidney disease: Secondary | ICD-10-CM | POA: Insufficient documentation

## 2021-10-21 DIAGNOSIS — Z853 Personal history of malignant neoplasm of breast: Secondary | ICD-10-CM | POA: Insufficient documentation

## 2021-10-21 DIAGNOSIS — Z170 Estrogen receptor positive status [ER+]: Secondary | ICD-10-CM | POA: Insufficient documentation

## 2021-10-21 DIAGNOSIS — Z79899 Other long term (current) drug therapy: Secondary | ICD-10-CM | POA: Insufficient documentation

## 2021-10-21 DIAGNOSIS — M8580 Other specified disorders of bone density and structure, unspecified site: Secondary | ICD-10-CM | POA: Diagnosis not present

## 2021-10-21 LAB — CMP (CANCER CENTER ONLY)
ALT: 16 U/L (ref 0–44)
AST: 20 U/L (ref 15–41)
Albumin: 4.3 g/dL (ref 3.5–5.0)
Alkaline Phosphatase: 79 U/L (ref 38–126)
Anion gap: 8 (ref 5–15)
BUN: 21 mg/dL (ref 8–23)
CO2: 26 mmol/L (ref 22–32)
Calcium: 10.1 mg/dL (ref 8.9–10.3)
Chloride: 106 mmol/L (ref 98–111)
Creatinine: 1.11 mg/dL — ABNORMAL HIGH (ref 0.44–1.00)
GFR, Estimated: 49 mL/min — ABNORMAL LOW (ref >60)
Glucose, Bld: 188 mg/dL — ABNORMAL HIGH (ref 70–99)
Potassium: 4 mmol/L (ref 3.5–5.1)
Sodium: 140 mmol/L (ref 135–145)
Total Bilirubin: 0.7 mg/dL (ref 0.3–1.2)
Total Protein: 7.1 g/dL (ref 6.5–8.1)

## 2021-10-21 LAB — CBC WITH DIFFERENTIAL (CANCER CENTER ONLY)
Abs Immature Granulocytes: 0.03 10*3/uL (ref 0.00–0.07)
Basophils Absolute: 0 10*3/uL (ref 0.0–0.1)
Basophils Relative: 1 %
Eosinophils Absolute: 0.1 10*3/uL (ref 0.0–0.5)
Eosinophils Relative: 1 %
HCT: 41.7 % (ref 36.0–46.0)
Hemoglobin: 13.9 g/dL (ref 12.0–15.0)
Immature Granulocytes: 0 %
Lymphocytes Relative: 20 %
Lymphs Abs: 1.4 10*3/uL (ref 0.7–4.0)
MCH: 32.1 pg (ref 26.0–34.0)
MCHC: 33.3 g/dL (ref 30.0–36.0)
MCV: 96.3 fL (ref 80.0–100.0)
Monocytes Absolute: 0.5 10*3/uL (ref 0.1–1.0)
Monocytes Relative: 7 %
Neutro Abs: 5.1 10*3/uL (ref 1.7–7.7)
Neutrophils Relative %: 71 %
Platelet Count: 160 10*3/uL (ref 150–400)
RBC: 4.33 MIL/uL (ref 3.87–5.11)
RDW: 13.9 % (ref 11.5–15.5)
WBC Count: 7.1 10*3/uL (ref 4.0–10.5)
nRBC: 0 % (ref 0.0–0.2)

## 2021-10-21 NOTE — Progress Notes (Signed)
[place]   Telephone:(336) [phone] Fax:(336) [phone]   Clinic Follow up Note   Patient Care Team: Adrienne Peng, NP as PCP - General (Family Medicine) Lelon Perla, MD as PCP - Cardiology (Cardiology) 10/21/2021  CHIEF COMPLAINT: Follow up left breast cancer   SUMMARY OF ONCOLOGIC HISTORY: Oncology History Overview Note  Cancer Staging Breast cancer of upper-outer quadrant of right female breast [facility]) Staging form: Breast, AJCC 8th Edition - Clinical stage from 09/22/2017: Stage IB (cT1b, cN0, cM0, G2, ER-, PR-, HER2-) - Signed by Truitt Merle, MD on 10/08/2017  Breast cancer, left breast [facility]) Staging form: Breast, AJCC 7th Edition - Clinical stage from 09/12/2012: Stage IA (T1b, N0, M0) - Unsigned      Breast cancer, left breast ([place])  08/25/2012 Receptors her2   ER 100% positive, PR 88% positive, HER-2 negative, Ki-67 16%   08/30/2012 Initial Diagnosis   Breast cancer, left breast   09/12/2012 Pathology Results   T1bN0 Grade 1 invasive ductal carcinoma, and DCIS.   09/12/2012 Surgery   Left breast lumpectomy and sentinel lymph node biopsy, negative margins.   11/09/2012 - 12/13/2012 Radiation Therapy   Adjuvant breast radiation   12/2012 - 11/2017 Anti-estrogen oral therapy   Anastrozole 1 mg daily, switched to Aromasin 25 mg daily in Jan 2015 due to diarrhea. Her Exemestane was switched to letrozole in 08/2017 due to high copay. She completed her 5 years in 11/2017.    09/01/2016 Imaging   [name] Outside films Breast form [facility] 09/01/2016 IMPRESSION: Probably Benign 1. No significant change in oval hypoechoic masses in the left breast at 11:00 2 cm from the nipple and 10:30 4 cm from the nipple. Both of these masses resemble the area of fat necrosis previously biopsies at 2:00. In addition, both masses have developing central benign or dystrophic appearing calcifications and are favored to be other areas of fat necrosis. A 6 month follow-up left breast mammogram and  ultrasound is recommended.   09/01/2016 Mammogram   [facility] Mammogram from [facility] 09/01/16 IMPRESSION  Incomplete - additional imaging evaluation needed Ultrasound of the upper medial left breast mass is recommended.    03/09/2017 Mammogram   Previous lumpectomy changes in the upper outer left breast anterior depth with stable associated dystrophic calcifications.  Biopsy clip at the 2:00 in the left breast near the lumpectomy site corresponding to previously biopsied benign fat necrosis.  Persistent round mass in the upper medial left breast with benign-appearing central round calcification.  No significant masses, calcifications, or other findings are seen in the breast  Impression: ultrasound is recommended for the left breast   03/09/2017 Breast US   Left breast ultrasound impression:  No significant change in oval hypoechoic masses in the left breast at 11:00 2 cm from the nipple and 10:30 4 cm from the nipple.  Both of these masses resemble the area of fat necrosis previously biopsied at 2:00.  In addition, both masses have developing central benign or dystrophic appearing calcifications and are favored to be other areas of fat necrosis.  A 64-month follow-up bilateral mammogram and left breast ultrasound is recommended.   09/14/2017 Breast US   Breast US Bilateral 09/14/17 at [facility]  IMPRESSION:  The 1 cm oval mass in the left breast at 9:30 posterior depth is highly suggestive of malignancy. An Ultrasound guided biopsy is recommended.  The 0.8 cm round mass in the left breast at 11-12 o'clock anterior depth is suspicious of malignancy. An ultrasound guided biopsy is  recommended.    09/22/2017 Pathology Results   Diagnosis 1. Breast, right, needle core biopsy - INVASIVE DUCTAL CARCINOMA, MSBR GRADE II/III. - DUCTAL CARCINOMA IN SITU WITH NECROSIS. 2. Breast, left, needle core biopsy - FAT NECROSIS. - NO EVIDENCE OF MALIGNANCY.   10/17/2017 Genetic Testing   Negative genetic testing on the  Multicancer panel.  The Multi-Gene Panel offered by [facility] includes sequencing and/or deletion duplication testing of the following 83 genes: ALK, APC, ATM, AXIN2,BAP1,  BARD1, BLM, BMPR1A, BRCA1, BRCA2, BRIP1, CASR, CDC73, CDH1, CDK4, CDKN1B, CDKN1C, CDKN2A (p14ARF), CDKN2A (p16INK4a), CEBPA, CHEK2, CTNNA1, DICER1, DIS3L2, EGFR (c.2369C>T, p.Thr790Met variant only), EPCAM (Deletion/duplication testing only), FH, FLCN, GATA2, GPC3, GREM1 (Promoter region deletion/duplication testing only), HOXB13 (c.251G>A, p.Gly84Glu), HRAS, KIT, MAX, MEN1, MET, MITF (c.952G>A, p.Glu318Lys variant only), MLH1, MSH2, MSH3, MSH6, MUTYH, NBN, NF1, NF2, NTHL1, PALB2, PDGFRA, PHOX2B, PMS2, POLD1, POLE, POT1, PRKAR1A, PTCH1, PTEN, RAD50, RAD51C, RAD51D, RB1, RECQL4, RET, RUNX1, SDHAF2, SDHA (sequence changes only), SDHB, SDHC, SDHD, SMAD4, SMARCA4, SMARCB1, SMARCE1, STK11, SUFU, TERT, TERT, TMEM127, TP53, TSC1, TSC2, VHL, WRN and WT1.  The report date is October 17, 2017.   Breast cancer of upper-outer quadrant of right female breast ([place])  09/14/2017 Mammogram   Diagnostic mammogram at [facility]  IMPRESSION:  The new 0.9 cm oval high density mass in the right breast posterior depth superior region seen on the mediolateral oblique view only is indeterminate. The 1.1 cm focal asymmetry in the left breast central to the nipple anterior depth is indeterminate.    09/14/2017 Imaging   [name] Bilateral at [facility] IMPRESSION: The 1 cm oval mass in the right breast at 9:30 posterior depth is highly suggestive of malignancy. The 0.8 cm round mass in the left breast at 11-12 o'clock anterior depth is suspicious of malignancy.    09/22/2017 Cancer Staging   Staging form: Breast, AJCC 8th Edition - Clinical stage from 09/22/2017: Stage IB (cT1b, cN0, cM0, G2, ER-, PR-, HER2-) - Signed by Truitt Merle, MD on 10/08/2017   09/22/2017 Pathology Results   Bilateral needle core biopsy 1. Breast, right, needle core biopsy - INVASIVE DUCTAL CARCINOMA, MSBR  GRADE II/III. - DUCTAL CARCINOMA IN SITU WITH NECROSIS. 2. Breast, left, needle core biopsy - FAT NECROSIS. - NO EVIDENCE OF MALIGNANCY.   09/25/2017 Receptors her2   Estrogen Receptor: 0 Progesterone 0 HER2: Negative. Ki-67: 80%    10/08/2017 Initial Diagnosis   Breast cancer of upper-outer quadrant of right female breast ([place])   10/26/2017 Surgery   BREAST LUMPECTOMY WITH RADIOACTIVE SEED AND SENTINEL LYMPH NODE BIOPSY by Dr. Excell Seltzer  10/26/17   10/26/2017 Pathology Results   Diagnosis 10/26/17 1. Breast, lumpectomy, Right - INVASIVE DUCTAL CARCINOMA, GRADE 2, SPANNING 1 CM. - HIGH GRADE DUCTAL CARCINOMA IN SITU WITH NECROSIS. - FINAL RESECTION MARGINS (PARTS #2, 3, 4) ARE NEGATIVE. - BIOPSY SITE. - SEE ONCOLOGY TABLE. 2. Breast, excision, additional anterior margin- 2 pieces right - BENIGN BREAST TISSUE. 3. Breast, excision, Right unoriented anterior margin - BENIGN BREAST TISSUE. 4. Breast, excision, Right deep margin - BENIGN BREAST TISSUE. 5. Lymph node, sentinel, biopsy, Right Axillary - ONE OF ONE LYMPH NODES NEGATIVE FOR CARCINOMA (0/1).   10/26/2017 Cancer Staging   Staging form: Breast, AJCC 8th Edition - Pathologic stage from 10/26/2017: Stage IB (pT1b, pN0, cM0, G2, ER-, PR-, HER2-) - Signed by Truitt Merle, MD on 11/12/2017   12/03/2017 - 02/25/2018 Chemotherapy   Weekly Abraxane for 12 weeks 12/03/17-02/25/18   03/2018 - 04/19/2018 Radiation Therapy  With Dr. Isidore Moos     CURRENT THERAPY: Surveillance   INTERVAL HISTORY: Adrienne [name] returns for follow up as scheduled. Last seen by me 04/22/21.  She is doing well overall except thinks she is getting a UTI.  Her symptoms are sluggishness and urine "crystals."  Denies dysuria, frequency/urgency, or fever.  She is prone to UTIs.  She denies concerns in her breast except her scars are deep and often get dirty when she perspires.  Denies new lump/mass, nipple discharge or inversion, or skin change.  Her chronic back pain and  sciatica fluctuate, she had a flare that began 3 months ago.  She got a new bed mattress.  She just completed PT and symptoms are improving.  She continues to walk 1.5-2.5 miles a day, do cardio, and yoga.  Denies cough, chest pain, dyspnea, unintentional weight loss, abdominal pain or bloating, or other new specific complaints.  All other systems were reviewed with the patient and are negative.  MEDICAL HISTORY:  Past Medical History:  Diagnosis Date   Allergy    Atrial fibrillation ([place])    Breast cancer ([place]) 08/25/12   Invasive ductal ca,DCIS   Diabetes mellitus    type II   Family history of cancer    Gout    Hearing loss    History of breast cancer    History of frequent urinary tract infections    History of radiation therapy 03/21/18- 04/19/18   Right Breast 2.67 Gy X 15 fractions, right breast boost 2 Gy X 5 fractions.    Hx of radiation therapy 11/22/12- 12/19/12   breast 4250 cGy 17 sessions, left breast boost 750 cGy 3 sessions   Hyperlipidemia    Hypertension    Osteopenia     SURGICAL HISTORY: Past Surgical History:  Procedure Laterality Date   ABDOMINAL HYSTERECTOMY Bilateral 1999   w/b/l salpingo-oopherectomy   APPENDECTOMY     BREAST LUMPECTOMY WITH NEEDLE LOCALIZATION AND AXILLARY SENTINEL LYMPH NODE BX Left 09/12/2012   Procedure: LEFT NEEDLE LOCALIZATION BREAST LUMPECTOMY TION AND AXILLARY SENTINEL LYMPH NODE BX;  Surgeon: Edward Jolly, MD;  Location: [facility];  Service: General;  Laterality: Left;   BREAST LUMPECTOMY WITH RADIOACTIVE SEED AND SENTINEL LYMPH NODE BIOPSY Right 10/26/2017   Procedure: BREAST LUMPECTOMY WITH RADIOACTIVE SEED AND SENTINEL LYMPH NODE BIOPSY;  Surgeon: Excell Seltzer, MD;  Location: [facility];  Service: General;  Laterality: Right;   BREAST SURGERY  1990's   right- fibroid cyst   CHOLECYSTECTOMY     SEPTOPLASTY     TONSILLECTOMY      I have reviewed the social history and family history with the patient and  they are unchanged from previous note.  ALLERGIES:  is allergic to penicillins, other, prednisone, radiaplexrx [wound dressings], and sulfamethoxazole.  MEDICATIONS:  Current Outpatient Medications  Medication Sig Dispense Refill   allopurinol (ZYLOPRIM) 300 MG tablet Take 1 tablet (300 mg total) by mouth daily. 90 tablet 3   atenolol (TENORMIN) 25 MG tablet TAKE 1 TABLET BY MOUTH EVERY DAY 90 tablet 3   blood glucose meter kit and supplies KIT Dispense based on patient and insurance preference. Use up to four times daily as directed. 1 each 0   chlorpheniramine (CHLOR-TRIMETON) 4 MG tablet Take 4 mg 2 (two) times daily as needed by mouth for allergies.     diltiazem (CARDIZEM CD) 360 MG 24 hr capsule TAKE 1 CAPSULE BY MOUTH EVERY DAY 90 capsule 3   ELIQUIS 2.5 MG  TABS tablet TAKE 1 TABLET BY MOUTH TWICE A DAY 180 tablet 1   fish oil-omega-3 fatty acids 1000 MG capsule Take 1 g by mouth daily.     glucose blood (ACCU-CHEK GUIDE) test strip TEST UP TO FOUR TIMES A DAY 100 strip 3   metFORMIN (GLUCOPHAGE) 500 MG tablet Take 1 tablet (500 mg total) by mouth 2 (two) times daily with a meal. 180 tablet 3   Multiple Vitamin (MULTIVITAMIN) tablet Take 1 tablet by mouth daily.     OVER THE COUNTER MEDICATION Take by mouth 2 (two) times daily. Presavision- for macular degeneration prevention     phenazopyridine (PYRIDIUM) 95 MG tablet Take 95 mg by mouth 3 (three) times daily as needed for pain.     rosuvastatin (CRESTOR) 5 MG tablet Take 1 tablet (5 mg total) by mouth daily. 90 tablet 3   VITAMIN D, ERGOCALCIFEROL, PO Take by mouth.     No current facility-administered medications for this visit.    PHYSICAL EXAMINATION: ECOG PERFORMANCE STATUS: 1 - Symptomatic but completely ambulatory  Vitals:   10/21/21 1042  BP: (!) 155/74  Pulse: 85  Resp: 17  Temp: 97.9 F (36.6 C)  SpO2: 96%   Filed Weights   10/21/21 1042  Weight: 120 lb 3.2 oz (54.5 kg)    GENERAL:alert, no distress and  comfortable SKIN: No rash EYES: sclera clear LYMPH:  no palpable cervical or supraclavicular lymphadenopathy  LUNGS:  normal breathing effort HEART:  no lower extremity edema ABDOMEN:abdomen soft, non-tender and normal bowel sounds Musculoskeletal: No focal spinal tenderness NEURO: alert & oriented x 3 with fluent speech, no focal motor/sensory deficits Breast exam: Breasts are symmetrical with inverted nipples, no discharge at baseline.  S/p bilateral lumpectomies.  Incisions completely healed with moderate distortion and scar tissue.  No palpable mass or nodularity in either breast or axilla that I could appreciate  LABORATORY DATA:  I have reviewed the data as listed    Latest Ref Rng & Units 10/21/2021   10:05 AM 06/25/2021    3:04 PM 04/22/2021    9:53 AM  CBC  WBC 4.0 - 10.5 K/uL 7.1  8.2  7.5   Hemoglobin 12.0 - 15.0 g/dL 13.9  13.5  14.0   Hematocrit 36.0 - 46.0 % 41.7  41.0  42.8   Platelets 150 - 400 K/uL 160  177.0  137         Latest Ref Rng & Units 10/21/2021   10:05 AM 06/25/2021    3:04 PM 04/22/2021    9:53 AM  CMP  Glucose 70 - 99 mg/dL 188  143  189   BUN 8 - 23 mg/dL $Remove'21  27  20   'vwgHtET$ Creatinine 0.44 - 1.00 mg/dL 1.11  1.07  1.04   Sodium 135 - 145 mmol/L 140  139  140   Potassium 3.5 - 5.1 mmol/L 4.0  4.1  3.8   Chloride 98 - 111 mmol/L 106  104  104   CO2 22 - 32 mmol/L $RemoveB'26  29  28   'WPPuaquC$ Calcium 8.9 - 10.3 mg/dL 10.1  9.7  9.7   Total Protein 6.5 - 8.1 g/dL 7.1  7.0  7.0   Total Bilirubin 0.3 - 1.2 mg/dL 0.7  0.7  0.6   Alkaline Phos 38 - 126 U/L 79  91  104   AST 15 - 41 U/L $Remo'20  17  19   'iYRpw$ ALT 0 - 44 U/L 16  18  16  RADIOGRAPHIC STUDIES: I have personally reviewed the radiological images as listed and agreed with the findings in the report. No results found.   ASSESSMENT & PLAN: 87 y.o. [place], [place] woman    1. Breast cancer of upper outer quadrant of right breast, invasive ductal carcinoma, stage IB, pT1bN0M0, grade 2, Triple Negative, Ki67:  80% -Diagnosed in 09/2017, s/p right lumpectomy with SLNB, 12 weeks of adjuvant Abraxane, and adjuvant radiation  - genetic testing was negative -Adrienne [name] is clinically doing well.  Physical exam is benign, labs are stable.  There is no clinical concern for breast cancer recurrence.  -think she may be getting a UTI, will check UA/culture and treat accordingly. -She is 4 years from initial diagnosis, the recurrence risk has decreased.  Continue surveillance -Next [facility] at [facility] this [facility], I asked her to request they send [name] the result -Next routine surveillance visit in 6 months, or sooner if needed   2. pT1bN0, stage IA invasive ductal carcinoma of the left breast, grade I, ER 100%, PR 88%, Ki-67 16%, HER-2/neu negative. -Diagnosed in 09/2012, s/p lumpectomy with SLNB 09/12/2012, adjuvant radiation, and adjuvant antiestrogen therapy with exemestane from 12/2012-11/2017 -Abnormal mammogram on 10/2019, biopsy 10/25/2019 showed benign fat necrosis with calcifications   3. Osteopenia -She is on vitamin D supplement daily; she stopped calcium due to hypercalcemia -DEXA 09/2017  and 01/2020 showed osteopenia and very high risk of fracture, 29% for major osteoporotic fracture and 18% for hip fracture.  This has been stable over the last 2 years - Dr. Burr Medico and I both have previously discussed bisphosphonate vs Prolia, patient is reluctant due to side effects.  -Due to renal dysfunction Prolia would be safer. Also reasonable to continue cal/vit D given that she is otherwise healthy and active, no recent fall.    4. Back pain, arthritis  -She has h/o left hip pain secondary to arthritis and bursitis.  -04/22/2021 she recently developed right mid-low back pain she attributes to sleeping incorrectly positioned. She had mild ttp on exam. This is likely MSK, secondary to rowing or positional.  -she has imaging that shows bilateral degenerative hip disease -3 months ago she developed a low back pain and  sciatica flare, improved with PT -Denies new or worsening pain -Continue monitoring  5. Diabetes, HTN, CKD -She had elevated blood sugar when on steroids for hip pain, DC'd steroids  -Med adjustments per PCP -Scr fluctuates, 1.1 today   6. Social Support -She lives at [facility] living and is very independent -Her children live close by her and she has a great grandson      PLAN: -Labs reviewed -UA and culture today, will call with results -Mammogram 6/23 at [facility], she will call to confirm the appointment -Continue surveillance, next visit in 6 months, or sooner if needed   Orders Placed This Encounter  Procedures   Urine Culture    Standing Status:   Future    Standing Expiration Date:   10/21/2022   Urinalysis, Complete w Microscopic    Standing Status:   Future    Standing Expiration Date:   10/22/2022   All questions were answered. The patient knows to call the clinic with any problems, questions or concerns. No barriers to learning was detected. I spent 20 minutes counseling the patient face to face. The total time spent in the appointment was 30 minutes and more than 50% was on counseling and review of test results     Alla Feeling, NP  10/21/21     

## 2021-10-21 NOTE — Telephone Encounter (Signed)
This nurse reached out to patient due to patient having an order for Urine culture to be completed.  Lab stated they never received the specimen.  No answer on home or mobile number and unable to leave a message.  Provider will be made aware.

## 2021-10-22 DIAGNOSIS — Z79899 Other long term (current) drug therapy: Secondary | ICD-10-CM | POA: Diagnosis not present

## 2021-10-22 DIAGNOSIS — Z170 Estrogen receptor positive status [ER+]: Secondary | ICD-10-CM | POA: Diagnosis not present

## 2021-10-22 DIAGNOSIS — R82998 Other abnormal findings in urine: Secondary | ICD-10-CM | POA: Diagnosis not present

## 2021-10-22 DIAGNOSIS — M8580 Other specified disorders of bone density and structure, unspecified site: Secondary | ICD-10-CM | POA: Diagnosis not present

## 2021-10-22 DIAGNOSIS — Z853 Personal history of malignant neoplasm of breast: Secondary | ICD-10-CM | POA: Diagnosis not present

## 2021-10-22 DIAGNOSIS — E1122 Type 2 diabetes mellitus with diabetic chronic kidney disease: Secondary | ICD-10-CM | POA: Diagnosis not present

## 2021-10-22 DIAGNOSIS — C50411 Malignant neoplasm of upper-outer quadrant of right female breast: Secondary | ICD-10-CM | POA: Diagnosis not present

## 2021-10-22 DIAGNOSIS — I129 Hypertensive chronic kidney disease with stage 1 through stage 4 chronic kidney disease, or unspecified chronic kidney disease: Secondary | ICD-10-CM | POA: Diagnosis not present

## 2021-10-22 DIAGNOSIS — N189 Chronic kidney disease, unspecified: Secondary | ICD-10-CM | POA: Diagnosis not present

## 2021-10-22 LAB — URINALYSIS, COMPLETE (UACMP) WITH MICROSCOPIC
Bilirubin Urine: NEGATIVE
Glucose, UA: NEGATIVE mg/dL
Hgb urine dipstick: NEGATIVE
Ketones, ur: NEGATIVE mg/dL
Nitrite: NEGATIVE
Protein, ur: NEGATIVE mg/dL
Specific Gravity, Urine: 1.015 (ref 1.005–1.030)
pH: 5 (ref 5.0–8.0)

## 2021-10-23 ENCOUNTER — Telehealth: Payer: Self-pay | Admitting: Hematology

## 2021-10-23 LAB — URINE CULTURE: Culture: NO GROWTH

## 2021-10-23 NOTE — Telephone Encounter (Signed)
Scheduled follow-up appointment per 6/20 los. Patient is aware. Mailed calendar.

## 2021-10-24 ENCOUNTER — Telehealth: Payer: Self-pay

## 2021-10-28 ENCOUNTER — Telehealth: Payer: Self-pay | Admitting: Adult Health

## 2021-10-28 MED ORDER — ACCU-CHEK SOFTCLIX LANCETS MISC: 12 refills | Status: AC

## 2021-10-31 ENCOUNTER — Other Ambulatory Visit: Payer: Self-pay | Admitting: Cardiology

## 2021-12-01 DIAGNOSIS — Z1231 Encounter for screening mammogram for malignant neoplasm of breast: Secondary | ICD-10-CM | POA: Diagnosis not present

## 2021-12-08 ENCOUNTER — Telehealth: Payer: Self-pay | Admitting: Cardiology

## 2021-12-08 DIAGNOSIS — R928 Other abnormal and inconclusive findings on diagnostic imaging of breast: Secondary | ICD-10-CM | POA: Diagnosis not present

## 2021-12-08 DIAGNOSIS — R921 Mammographic calcification found on diagnostic imaging of breast: Secondary | ICD-10-CM | POA: Diagnosis not present

## 2021-12-08 LAB — HM MAMMOGRAPHY: HM Mammogram: ABNORMAL — AB (ref 0–4)

## 2021-12-08 NOTE — Telephone Encounter (Signed)
   Pre-operative Risk Assessment    Patient Name: Adrienne [name]  DOB: 1934/09/11 MRN: [id number]      Request for Surgical Clearance    Procedure:  ultrasound guided biopsy, one on each breast  Date of Surgery:  Clearance 12/15/21                                 Surgeon:  Dr. Dr. Reece Packer  Surgeon's Group or Practice Name:  [facility] Phone number:  [phone] Fax number:  [phone]   Type of Clearance Requested:   - Pharmacy:  Hold Apixaban (Eliquis) want to know if the patient can hold the eliquis or not, need guidance for how long if she can hold it   Type of Anesthesia:  Local    Additional requests/questions:    Signed, Selena Zobro   12/08/2021, 3:22 PM

## 2021-12-08 NOTE — Telephone Encounter (Signed)
   Primary Cardiologist: Kirk Ruths, MD  Chart reviewed as part of pre-operative protocol coverage. Per office protocol, patient can hold Eliquis for 1 day prior to procedure  I will route this recommendation to the requesting party via [facility] fax function and remove from pre-op pool.  Please call with questions.  Emmaline Life, NP-C    12/08/2021, 4:38 PM [facility] [address] Office [phone] Fax [phone]

## 2021-12-08 NOTE — Telephone Encounter (Signed)
Patient with diagnosis of afib on Eliquis for anticoagulation.    Procedure: ultrasound guided biopsy, one on each breast Date of procedure: 12/15/21   CHA2DS2-VASc Score = 5  This indicates a 7.2% annual risk of stroke. The patient's score is based upon: CHF History: 0 HTN History: 1 Diabetes History: 1 Stroke History: 0 Vascular Disease History: 0 Age Score: 2 Gender Score: 1    CrCl 35 ml/min Platelet count 160k  Per office protocol, patient can hold Eliquis for 1 day prior to procedure.

## 2021-12-10 ENCOUNTER — Encounter: Payer: Self-pay | Admitting: Adult Health

## 2021-12-11 ENCOUNTER — Encounter: Payer: Self-pay | Admitting: Adult Health

## 2021-12-15 ENCOUNTER — Other Ambulatory Visit: Payer: Self-pay

## 2021-12-15 DIAGNOSIS — R928 Other abnormal and inconclusive findings on diagnostic imaging of breast: Secondary | ICD-10-CM | POA: Diagnosis not present

## 2021-12-15 DIAGNOSIS — N6315 Unspecified lump in the right breast, overlapping quadrants: Secondary | ICD-10-CM | POA: Diagnosis not present

## 2021-12-25 ENCOUNTER — Encounter: Payer: Self-pay | Admitting: Hematology

## 2021-12-25 DIAGNOSIS — Z780 Asymptomatic menopausal state: Secondary | ICD-10-CM | POA: Diagnosis not present

## 2021-12-25 DIAGNOSIS — M85851 Other specified disorders of bone density and structure, right thigh: Secondary | ICD-10-CM | POA: Diagnosis not present

## 2021-12-25 DIAGNOSIS — M85852 Other specified disorders of bone density and structure, left thigh: Secondary | ICD-10-CM | POA: Diagnosis not present

## 2022-02-03 ENCOUNTER — Other Ambulatory Visit: Payer: Self-pay

## 2022-03-31 ENCOUNTER — Ambulatory Visit: Payer: Medicare PPO

## 2022-03-31 ENCOUNTER — Ambulatory Visit (INDEPENDENT_AMBULATORY_CARE_PROVIDER_SITE_OTHER): Payer: Medicare PPO

## 2022-03-31 VITALS — Ht 61.0 in | Wt 123.0 lb

## 2022-03-31 DIAGNOSIS — Z0000 Encounter for general adult medical examination without abnormal findings: Secondary | ICD-10-CM | POA: Diagnosis not present

## 2022-03-31 NOTE — Patient Instructions (Addendum)
Adrienne [name] , Thank you for taking time to come for your Medicare Wellness Visit. I appreciate your ongoing commitment to your health goals. Please review the following plan we discussed and let me know if I can assist you in the future.   These are the goals we discussed:  Goals       Patient Stated      I would like to get back to walking 2.5 miles per day      Patient Stated      03/25/2021, wants to decrease amount of medications      Stay Healthy (pt-stated)        This is a list of the screening recommended for you and due dates:  Health Maintenance  Topic Date Due   Yearly kidney health urinalysis for diabetes  03/24/2016   Complete foot exam   04/30/2019   Hemoglobin A1C  03/27/2022   Zoster (Shingles) Vaccine (1 of 2) 07/01/2022*   COVID-19 Vaccine (5 - 2023-24 season) 04/16/2022   Eye exam for diabetics  09/03/2022   Yearly kidney function blood test for diabetes  10/22/2022   Mammogram  12/09/2022   Medicare Annual Wellness Visit  04/01/2023   Pneumonia Vaccine  Completed   Flu Shot  Completed   DEXA scan (bone density measurement)  Completed   HPV Vaccine  Aged Out  *Topic was postponed. The date shown is not the original due date.    Advanced directives: Please bring a copy of your health care power of attorney and living will to the office to be added to your chart at your convenience.   Conditions/risks identified: None  Next appointment: Follow up in one year for your annual wellness visit    Preventive Care 65 Years and Older, Female Preventive care refers to lifestyle choices and visits with your health care provider that can promote health and wellness. What does preventive care include? A yearly physical exam. This is also called an annual well check. Dental exams once or twice a year. Routine eye exams. Ask your health care provider how often you should have your eyes checked. Personal lifestyle choices, including: Daily care of your teeth and  gums. Regular physical activity. Eating a healthy diet. Avoiding tobacco and drug use. Limiting alcohol use. Practicing safe sex. Taking low-dose aspirin every day. Taking vitamin and mineral supplements as recommended by your health care provider. What happens during an annual well check? The services and screenings done by your health care provider during your annual well check will depend on your age, overall health, lifestyle risk factors, and family history of disease. Counseling  Your health care provider may ask you questions about your: Alcohol use. Tobacco use. Drug use. Emotional well-being. Home and relationship well-being. Sexual activity. Eating habits. History of falls. Memory and ability to understand (cognition). Work and work Statistician. Reproductive health. Screening  You may have the following tests or measurements: Height, weight, and BMI. Blood pressure. Lipid and cholesterol levels. These may be checked every 5 years, or more frequently if you are over 67 years old. Skin check. Lung cancer screening. You may have this screening every year starting at age 76 if you have a 30-pack-year history of smoking and currently smoke or have quit within the past 15 years. Fecal occult blood test (FOBT) of the stool. You may have this test every year starting at age 66. Flexible sigmoidoscopy or colonoscopy. You may have a sigmoidoscopy every 5 years or a colonoscopy every 10  years starting at age 67. Hepatitis C blood test. Hepatitis B blood test. Sexually transmitted disease (STD) testing. Diabetes screening. This is done by checking your blood sugar (glucose) after you have not eaten for a while (fasting). You may have this done every 1-3 years. Bone density scan. This is done to screen for osteoporosis. You may have this done starting at age 61. Mammogram. This may be done every 1-2 years. Talk to your health care provider about how often you should have regular  mammograms. Talk with your health care provider about your test results, treatment options, and if necessary, the need for more tests. Vaccines  Your health care provider may recommend certain vaccines, such as: Influenza vaccine. This is recommended every year. Tetanus, diphtheria, and acellular pertussis (Tdap, Td) vaccine. You may need a Td booster every 10 years. Zoster vaccine. You may need this after age 63. Pneumococcal 13-valent conjugate (PCV13) vaccine. One dose is recommended after age 20. Pneumococcal polysaccharide (PPSV23) vaccine. One dose is recommended after age 17. Talk to your health care provider about which screenings and vaccines you need and how often you need them. This information is not intended to replace advice given to you by your health care provider. Make sure you discuss any questions you have with your health care provider. Document Released: 05/17/2015 Document Revised: 01/08/2016 Document Reviewed: 02/19/2015 Elsevier Interactive Patient Education  2017 [employer] Prevention in the Home Falls can cause injuries. They can happen to people of all ages. There are many things you can do to make your home safe and to help prevent falls. What can I do on the outside of my home? Regularly fix the edges of walkways and driveways and fix any cracks. Remove anything that might make you trip as you walk through a door, such as a raised step or threshold. Trim any bushes or trees on the path to your home. Use bright outdoor lighting. Clear any walking paths of anything that might make someone trip, such as rocks or tools. Regularly check to see if handrails are loose or broken. Make sure that both sides of any steps have handrails. Any raised decks and porches should have guardrails on the edges. Have any leaves, snow, or ice cleared regularly. Use sand or salt on walking paths during winter. Clean up any spills in your garage right away. This includes oil  or grease spills. What can I do in the bathroom? Use night lights. Install grab bars by the toilet and in the tub and shower. Do not use towel bars as grab bars. Use non-skid mats or decals in the tub or shower. If you need to sit down in the shower, use a plastic, non-slip stool. Keep the floor dry. Clean up any water that spills on the floor as soon as it happens. Remove soap buildup in the tub or shower regularly. Attach bath mats securely with double-sided non-slip rug tape. Do not have throw rugs and other things on the floor that can make you trip. What can I do in the bedroom? Use night lights. Make sure that you have a light by your bed that is easy to reach. Do not use any sheets or blankets that are too big for your bed. They should not hang down onto the floor. Have a firm chair that has side arms. You can use this for support while you get dressed. Do not have throw rugs and other things on the floor that can make you trip.  What can I do in the kitchen? Clean up any spills right away. Avoid walking on wet floors. Keep items that you use a lot in easy-to-reach places. If you need to reach something above you, use a strong step stool that has a grab bar. Keep electrical cords out of the way. Do not use floor polish or wax that makes floors slippery. If you must use wax, use non-skid floor wax. Do not have throw rugs and other things on the floor that can make you trip. What can I do with my stairs? Do not leave any items on the stairs. Make sure that there are handrails on both sides of the stairs and use them. Fix handrails that are broken or loose. Make sure that handrails are as long as the stairways. Check any carpeting to make sure that it is firmly attached to the stairs. Fix any carpet that is loose or worn. Avoid having throw rugs at the top or bottom of the stairs. If you do have throw rugs, attach them to the floor with carpet tape. Make sure that you have a light  switch at the top of the stairs and the bottom of the stairs. If you do not have them, ask someone to add them for you. What else can I do to help prevent falls? Wear shoes that: Do not have high heels. Have rubber bottoms. Are comfortable and fit you well. Are closed at the toe. Do not wear sandals. If you use a stepladder: Make sure that it is fully opened. Do not climb a closed stepladder. Make sure that both sides of the stepladder are locked into place. Ask someone to hold it for you, if possible. Clearly mark and make sure that you can see: Any grab bars or handrails. First and last steps. Where the edge of each step is. Use tools that help you move around (mobility aids) if they are needed. These include: Canes. Walkers. Scooters. Crutches. Turn on the lights when you go into a dark area. Replace any light bulbs as soon as they burn out. Set up your furniture so you have a clear path. Avoid moving your furniture around. If any of your floors are uneven, fix them. If there are any pets around you, be aware of where they are. Review your medicines with your doctor. Some medicines can make you feel dizzy. This can increase your chance of falling. Ask your doctor what other things that you can do to help prevent falls. This information is not intended to replace advice given to you by your health care provider. Make sure you discuss any questions you have with your health care provider. Document Released: 02/14/2009 Document Revised: 09/26/2015 Document Reviewed: 05/25/2014 Elsevier Interactive Patient Education  2017 [employer].

## 2022-03-31 NOTE — Progress Notes (Signed)
Subjective:   Adrienne [name] is a 87 y.o. female who presents for Medicare Annual (Subsequent) preventive examination.  Review of Systems    Virtual Visit via Telephone Note  I connected with  Adrienne [name] on 03/31/22 at  1:30 PM EST by telephone and verified that I am speaking with the correct person using two identifiers.  Location: Patient: Home Provider: Office Persons participating in the virtual visit: patient/Nurse Health Advisor   I discussed the limitations, risks, security and privacy concerns of performing an evaluation and management service by telephone and the availability of in person appointments. The patient expressed understanding and agreed to proceed.  Interactive audio and video telecommunications were attempted between this nurse and patient, however failed, due to patient having technical difficulties OR patient did not have access to video capability.  We continued and completed visit with audio only.  Some vital signs may be absent or patient reported.   Criselda Peaches, LPN  Cardiac Risk Factors include: advanced age (>33mn, >>43women);diabetes mellitus;hypertension     Objective:    Today's Vitals   03/31/22 1336  Weight: 123 lb (55.8 kg)  Height: _0  (1.549 m)   Body mass index is 23.24 kg/m.     03/31/2022    1:45 PM 03/25/2021    1:33 PM 02/21/2020    1:27 PM 04/11/2019   11:55 AM 05/20/2018   10:04 AM 03/15/2018    8:47 AM 02/11/2018   10:42 AM  Advanced Directives  Does Patient Have a Medical Advance Directive? _1  Yes No  Type of AParamed[place]of [place] will [place] will [place]Living will Living will [place]Living will [place]Living will   Does patient want to make changes to medical advance directive?    No - Patient declined     Copy of [place] Chart? No - copy  requested No - copy requested No - copy requested  No - copy requested No - copy requested   Would patient like information on creating a medical advance directive?    No - Patient declined       Current Medications (verified) Outpatient Encounter Medications as of 03/31/2022  Medication Sig   Accu-Chek Softclix Lancets lancets Use to test up to 4 times daily.   allopurinol (ZYLOPRIM) 300 MG tablet Take 1 tablet (300 mg total) by mouth daily.   atenolol (TENORMIN) 25 MG tablet TAKE 1 TABLET BY MOUTH EVERY DAY   blood glucose meter kit and supplies KIT Dispense based on patient and insurance preference. Use up to four times daily as directed.   chlorpheniramine (CHLOR-TRIMETON) 4 MG tablet Take 4 mg 2 (two) times daily as needed by mouth for allergies.   diltiazem (CARDIZEM CD) 360 MG 24 hr capsule TAKE 1 CAPSULE BY MOUTH EVERY DAY   ELIQUIS 2.5 MG TABS tablet TAKE 1 TABLET BY MOUTH TWICE A DAY   fish oil-omega-3 fatty acids 1000 MG capsule Take 1 g by mouth daily.   glucose blood (ACCU-CHEK GUIDE) test strip TEST UP TO FOUR TIMES A DAY   metFORMIN (GLUCOPHAGE) 500 MG tablet Take 1 tablet (500 mg total) by mouth 2 (two) times daily with a meal.   Multiple Vitamin (MULTIVITAMIN) tablet Take 1 tablet by mouth daily.   OVER THE COUNTER MEDICATION Take by mouth 2 (two) times daily. Presavision- for macular degeneration prevention   phenazopyridine (  PYRIDIUM) 95 MG tablet Take 95 mg by mouth 3 (three) times daily as needed for pain.   rosuvastatin (CRESTOR) 5 MG tablet Take 1 tablet (5 mg total) by mouth daily.   VITAMIN D, ERGOCALCIFEROL, PO Take by mouth.   No facility-administered encounter medications on file as of 03/31/2022.    Allergies (verified) Penicillins, Other, Prednisone, Radiaplexrx [wound dressings], and Sulfamethoxazole   History: Past Medical History:  Diagnosis Date   Allergy    Atrial fibrillation ([place])    Breast cancer ([place]) 08/25/12   Invasive ductal ca,DCIS    Diabetes mellitus    type II   Family history of cancer    Gout    Hearing loss    History of breast cancer    History of frequent urinary tract infections    History of radiation therapy 03/21/18- 04/19/18   Right Breast 2.67 Gy X 15 fractions, right breast boost 2 Gy X 5 fractions.    Hx of radiation therapy 11/22/12- 12/19/12   breast 4250 cGy 17 sessions, left breast boost 750 cGy 3 sessions   Hyperlipidemia    Hypertension    Osteopenia    Past Surgical History:  Procedure Laterality Date   ABDOMINAL HYSTERECTOMY Bilateral 1999   w/b/l salpingo-oopherectomy   APPENDECTOMY     BREAST LUMPECTOMY WITH NEEDLE LOCALIZATION AND AXILLARY SENTINEL LYMPH NODE BX Left 09/12/2012   Procedure: LEFT NEEDLE LOCALIZATION BREAST LUMPECTOMY TION AND AXILLARY SENTINEL LYMPH NODE BX;  Surgeon: Edward Jolly, MD;  Location: [facility];  Service: General;  Laterality: Left;   BREAST LUMPECTOMY WITH RADIOACTIVE SEED AND SENTINEL LYMPH NODE BIOPSY Right 10/26/2017   Procedure: BREAST LUMPECTOMY WITH RADIOACTIVE SEED AND SENTINEL LYMPH NODE BIOPSY;  Surgeon: Excell Seltzer, MD;  Location: [facility];  Service: General;  Laterality: Right;   BREAST SURGERY  1990's   right- fibroid cyst   CHOLECYSTECTOMY     SEPTOPLASTY     TONSILLECTOMY     Family History  Problem Relation Age of Onset   Hypertension Mother    Heart disease Mother        Murmur and irregular heart disease   Dementia Mother        d. 8   Hypertension Father    Aneurysm Father 30   Breast cancer Cousin 79       mat first cousin   Social History   Socioeconomic History   Marital status: Married    Spouse name: Not on file   Number of children: 3   Years of education: Not on file   Highest education level: Not on file  Occupational History   Not on file  Tobacco Use   Smoking status: Former    Packs/day: 0.25    Years: 4.00    Total pack years: 1.00    Types: Cigarettes   Smokeless tobacco: Never    Tobacco comments:    Cigarette use was 50 years ago  Vaping Use   Vaping Use: Never used  Substance and Sexual Activity   Alcohol use: Not Currently   Drug use: No   Sexual activity: Not Currently    Comment: menarche age 59, 82st birth age 35, G39, HRT x 7 years?  Other Topics Concern   Not on file  Social History Narrative   Married - husband in memory care unit    Three children ( One lives at [place] and two in [place]      She likes to read  and go to [employer]   Financial Resource Strain: Low Risk  (03/31/2022)   Overall Financial Resource Strain (CARDIA)    Difficulty of Paying Living Expenses: Not hard at all  Food Insecurity: No Food Insecurity (03/31/2022)   Hunger Vital Sign    Worried About Running Out of Food in the Last Year: Never true    Ran Out of Food in the Last Year: Never true  Transportation Needs: No Transportation Needs (03/31/2022)   PRAPARE - Hydrologist (Medical): No    Lack of Transportation (Non-Medical): No  Physical Activity: Sufficiently Active (03/31/2022)   Exercise Vital Sign    Days of Exercise per Week: 7 days    Minutes of Exercise per Session: 90 min  Stress: No Stress Concern Present (03/31/2022)   [place]    Feeling of Stress : Not at all  Social Connections: Moderately Integrated (03/31/2022)   Social Connection and Isolation Panel [NHANES]    Frequency of Communication with Friends and Family: More than three times a week    Frequency of Social Gatherings with Friends and Family: More than three times a week    Attends Religious Services: More than 4 times per year    Active Member of [employer] or Organizations: Yes    Attends Archivist Meetings: More than 4 times per year    Marital Status: Widowed    Tobacco Counseling Counseling given: Not Answered Tobacco comments: Cigarette use was 50  years ago   Clinical Intake:  Pre-visit preparation completed: No  Pain : No/denies pain   Nutrition Risk Assessment:  Has the patient had any N/V/D within the last 2 months?  No  Does the patient have any non-healing wounds?  No  Has the patient had any unintentional weight loss or weight gain?  No   Diabetes:  Is the patient diabetic?  Yes  If diabetic, was a CBG obtained today?  No  Did the patient bring in their glucometer from home?  No  How often do you monitor your CBG's? PRN.   Financial Strains and Diabetes Management:  Are you having any financial strains with the device, your supplies or your medication? No .  Does the patient want to be seen by Chronic Care Management for management of their diabetes?  No  Would the patient like to be referred to a Nutritionist or for Diabetic Management?  No   Diabetic Exams:  Diabetic Eye Exam: Completed No. Overdue for diabetic eye exam. Pt has been advised about the importance in completing this exam. A referral has been placed today. Message sent to referral coordinator for scheduling purposes. Advised pt to expect a call from office referred to regarding appt.  Diabetic Foot Exam: Completed No. Pt has been advised about the importance in completing this exam. Pt is scheduled for diabetic foot exam on Followed by PCP.    BMI - recorded: 23.24 Nutritional Status: BMI of 19-24  Normal Nutritional Risks: None Diabetes: Yes CBG done?: No Did pt. bring in CBG monitor from home?: No  How often do you need to have someone help you when you read instructions, pamphlets, or other written materials from your doctor or pharmacy?: 1 - Never  Diabetic?  Yes  Interpreter Needed?: No  Information entered by :: Rolene Arbour LPN   Activities of Daily Living    03/31/2022    1:43  PM 09/23/2021   10:28 AM  In your present state of health, do you have any difficulty performing the following activities:  Hearing? 1 1  Comment  Wears hearing aids   Vision? 0 0  Difficulty concentrating or making decisions? 0 0  Walking or climbing stairs? 0 1  Dressing or bathing? 0 0  Doing errands, shopping? 0 0  Preparing Food and eating ? N   Using the Toilet? N   In the past six months, have you accidently leaked urine? N   Do you have problems with loss of bowel control? N   Managing your Medications? N   Managing your Finances? N   Housekeeping or managing your Housekeeping? N     Patient Care Team: [name], NP as PCP - General (Family Medicine) Stanford Breed Denice Bors, MD as PCP - Cardiology (Cardiology)  Indicate any recent Medical Services you may have received from other than [facility] providers in the past year (date may be approximate).     Assessment:   This is a routine wellness examination for [facility].  Hearing/Vision screen Hearing Screening - Comments:: Wears hearing aids Vision Screening - Comments:: - up to date with routine eye exams with  Dr Sabra Heck  Dietary issues and exercise activities discussed: Exercise limited by: None identified   Goals Addressed               This Visit's Progress     Stay Healthy (pt-stated)         Depression Screen    03/31/2022    1:42 PM 03/25/2021    1:34 PM 02/21/2020    1:29 PM 04/29/2018    1:12 PM 10/11/2017    7:44 AM 03/10/2016    7:23 AM 03/10/2016    6:57 AM  PHQ 2/9 Scores  PHQ - 2 Score 0 0 0 0 0 0 0  PHQ- 9 Score   0        Fall Risk    03/31/2022    1:44 PM 09/23/2021   10:27 AM 06/25/2021    2:34 PM 03/25/2021    1:33 PM 02/21/2020    1:28 PM  Fall Risk   Falls in the past year? 0 0 0 0 0  Number falls in past yr: 0 0 0  0  Injury with Fall? 0 0 0  0  Risk for fall due to : No Fall Risks History of fall(s);No Fall Risks  Medication side effect Orthopedic patient  Follow up Falls prevention discussed Falls evaluation completed  Falls evaluation completed;Education provided;Falls prevention discussed Falls evaluation completed     FALL RISK PREVENTION PERTAINING TO THE HOME:  Any stairs in or around the home? Yes  If so, are there any without handrails? No  Home free of loose throw rugs in walkways, pet beds, electrical cords, etc? Yes  Adequate lighting in your home to reduce risk of falls? Yes   ASSISTIVE DEVICES UTILIZED TO PREVENT FALLS:  Life alert? No Use of a cane, walker or w/c? Yes  Grab bars in the bathroom? Yes  Shower chair or bench in shower? Yes  Elevated toilet seat or a handicapped toilet? Yes   TIMED UP AND GO:  Was the test performed? No . Audio Visit  Cognitive Function:        03/31/2022    1:45 PM 03/25/2021    1:36 PM 02/21/2020    1:33 PM  6CIT Screen  What Year? 0 points 0 points 0 points  What month? 0 points 0 points 0 points  What time? 0 points 0 points 0 points  Count back from 20 0 points 0 points 0 points  Months in reverse 0 points 2 points 0 points  Repeat phrase 0 points 2 points 0 points  Total Score 0 points 4 points 0 points    Immunizations Immunization History  Administered Date(s) Administered   Fluad Quad(high Dose 65+) 02/04/2019, 01/09/2020, 02/19/2022   Influenza Split 02/17/2012, 02/10/2013, 02/15/2014   Influenza Whole 05/04/2005, 01/26/2008, 02/16/2009, 02/01/2010   Influenza, High Dose Seasonal PF 01/25/2015, 01/22/2016, 02/06/2017, 02/06/2017, 01/21/2018, 02/17/2021   Moderna Sars-Covid-2 Vaccination 05/08/2019, 06/06/2019, 08/26/2020   Pneumococcal Conjugate-13 03/27/2014   Pneumococcal Polysaccharide-23 09/30/2001, 05/28/2010   Td 12/28/2003   Tetanus 03/27/2014   Unspecified SARS-COV-2 Vaccination 02/19/2022   Zoster, Live 12/08/2007      Flu Vaccine status: Up to date  Pneumococcal vaccine status: Up to date  Covid-19 vaccine status: Completed vaccines  Qualifies for Shingles Vaccine? Yes   Zostavax completed No   Shingrix Completed?: No.    Education has been provided regarding the importance of this vaccine. Patient has  been advised to call insurance company to determine out of pocket expense if they have not yet received this vaccine. Advised may also receive vaccine at local pharmacy or Health Dept. Verbalized acceptance and understanding.  Screening Tests Health Maintenance  Topic Date Due   Diabetic kidney evaluation - Urine ACR  03/24/2016   FOOT EXAM  04/30/2019   HEMOGLOBIN A1C  03/27/2022   Zoster Vaccines- Shingrix (1 of 2) 07/01/2022 (Originally 01/19/1954)   COVID-19 Vaccine (5 - 2023-24 season) 04/16/2022   OPHTHALMOLOGY EXAM  09/03/2022   Diabetic kidney evaluation - GFR measurement  10/22/2022   MAMMOGRAM  12/09/2022   Medicare Annual Wellness (AWV)  04/01/2023   Pneumonia Vaccine 31+ Years old  Completed   INFLUENZA VACCINE  Completed   DEXA SCAN  Completed   HPV VACCINES  Aged Out    Health Maintenance  Health Maintenance Due  Topic Date Due   Diabetic kidney evaluation - Urine ACR  03/24/2016   FOOT EXAM  04/30/2019   HEMOGLOBIN A1C  03/27/2022    Colorectal cancer screening: No longer required.   Mammogram status: No longer required due to Age.  Bone Density status: Completed 12/25/21. Results reflect: Bone density results: OSTEOPOROSIS. Repeat every   years.  Lung Cancer Screening: (Low Dose CT Chest recommended if Age 8-80 years, 30 pack-year currently smoking OR have quit w/in 15years.) does not qualify.     Additional Screening:  Hepatitis C Screening: does not qualify; Completed   Vision Screening: Recommended annual ophthalmology exams for early detection of glaucoma and other disorders of the eye. Is the patient up to date with their annual eye exam?  Yes  Who is the provider or what is the name of the office in which the patient attends annual eye exams? Dr Sabra Heck If pt is not established with a provider, would they like to be referred to a provider to establish care? No .   Dental Screening: Recommended annual dental exams for proper oral hygiene  Community  Resource Referral / Chronic Care Management:  CRR required this visit?  No   CCM required this visit?  No      Plan:     I have personally reviewed and noted the following in the patient's chart:   Medical and social history Use of alcohol, tobacco or illicit drugs  Current  medications and supplements including opioid prescriptions. Patient is not currently taking opioid prescriptions. Functional ability and status Nutritional status Physical activity Advanced directives List of other physicians Hospitalizations, surgeries, and ER visits in previous 12 months Vitals Screenings to include cognitive, depression, and falls Referrals and appointments  In addition, I have reviewed and discussed with patient certain preventive protocols, quality metrics, and best practice recommendations. A written personalized care plan for preventive services as well as general preventive health recommendations were provided to patient.     Criselda Peaches, LPN   [id number]   Nurse Notes: Patient due Diabetic Kidney Evaluation- Urine ACR

## 2022-04-01 ENCOUNTER — Ambulatory Visit: Payer: Medicare PPO | Admitting: Adult Health

## 2022-04-01 ENCOUNTER — Encounter: Payer: Self-pay | Admitting: Adult Health

## 2022-04-01 VITALS — BP 120/80 | HR 88 | Temp 97.1°F | Ht 61.0 in | Wt 121.0 lb

## 2022-04-01 DIAGNOSIS — E139 Other specified diabetes mellitus without complications: Secondary | ICD-10-CM

## 2022-04-01 DIAGNOSIS — I10 Essential (primary) hypertension: Secondary | ICD-10-CM

## 2022-04-01 LAB — POCT GLYCOSYLATED HEMOGLOBIN (HGB A1C): Hemoglobin A1C: 7.6 % — AB (ref 4.0–5.6)

## 2022-04-01 NOTE — Patient Instructions (Addendum)
Your A1c increased to 7.6 - I am going to keep you on your metformin   Follow up after Feb 22nd for your physical exam and we will retest then   Work on reducing carbs and sugars

## 2022-04-01 NOTE — Progress Notes (Signed)
Subjective:    Patient ID: Adrienne [name], female    DOB: May 27, 1934, 87 y.o.   MRN: [id number]  HPI 87 year old female who  has a past medical history of Allergy, Atrial fibrillation ([place]), Breast cancer ([place]) (08/25/12), Diabetes mellitus, Family history of cancer, Gout, Hearing loss, History of breast cancer, History of frequent urinary tract infections, History of radiation therapy (03/21/18- 04/19/18), radiation therapy (11/22/12- 12/19/12), Hyperlipidemia, Hypertension, and Osteopenia.  She presents to the office today for 53-monthfollow-up regarding diabetes and HTN   Diabetes mellitus type II-managed with metformin 500 mg twice daily.  She does not check her blood sugars at home on a routine basis.  She denies episodes of hypoglycemia.  She does try and stay active with yoga and cardio classes multiple times a week and then does weight training 7 days a week. Her diet has been higher in carbs and sugars.  Lab Results  Component Value Date   HGBA1C 7.6 (A) 04/01/2022   HTN -managed with Cardizem 360 mg daily and atenolol 5 mg daily.  She denies dizziness, lightheadedness, chest pain, or shortness of breath BP Readings from Last 3 Encounters:  04/01/22 120/80  10/21/21 (!) 155/74  09/23/21 130/82    Review of Systems See HPI   Past Medical History:  Diagnosis Date   Allergy    [place]    [place]   Invasive ductal ca,DCIS   Diabetes mellitus    type II   Family history of cancer    Gout    Hearing loss    History of breast cancer    History of frequent urinary tract infections    History of radiation therapy 03/21/18- 04/19/18   Right Breast 2.67 Gy X 15 fractions, right breast boost 2 Gy X 5 fractions.    Hx of radiation therapy 11/22/12- 12/19/12   breast 4250 cGy 17 sessions, left breast boost 750 cGy 3 sessions   Hyperlipidemia    Hypertension    Osteopenia     Social History   Socioeconomic History   Marital status:  Married    Spouse name: Not on file   Number of children: 3   Years of education: Not on file   Highest education level: Not on file  Occupational History   Not on file  Tobacco Use   Smoking status: Former    Packs/day: 0.25    Years: 4.00    Total pack years: 1.00    Types: Cigarettes   Smokeless tobacco: Never   Tobacco comments:    Cigarette use was 50 years ago  Vaping Use   Vaping Use: Never used  Substance and Sexual Activity   Alcohol use: Not Currently   Drug use: No   Sexual activity: Not Currently    Comment: menarche age 87 162stbirth age 87 GG67 HRT x 7 years?  Other Topics Concern   Not on file  Social History Narrative   Married - husband in memory care unit    [place] ( [place]     She likes to read and go to [employer]   Social Determinants of Health   Financial Resource Strain: Low Risk  (03/31/2022)   Overall Financial Resource Strain (CARDIA)    Difficulty of Paying Living Expenses: Not hard at all  Food Insecurity: No Food Insecurity (03/31/2022)   Hunger Vital Sign    Worried About Running Out  of Food in the Last Year: Never true    [place] in the Last Year: Never true  Transportation Needs: No Transportation Needs (03/31/2022)   PRAPARE - Hydrologist (Medical): No    Lack of Transportation (Non-Medical): No  Physical Activity: Sufficiently Active (03/31/2022)   Exercise Vital Sign    Days of Exercise per Week: 7 days    Minutes of Exercise per Session: 90 min  Stress: No Stress Concern Present (03/31/2022)   [place]    Feeling of Stress : Not at all  Social Connections: Moderately Integrated (03/31/2022)   Social Connection and Isolation Panel [NHANES]    Frequency of Communication with Friends and Family: More than three times a week    Frequency of Social Gatherings with Friends and Family:  More than three times a week    Attends Religious Services: More than 4 times per year    Active Member of [employer] or Organizations: Yes    Attends Archivist Meetings: More than 4 times per year    Marital Status: Widowed  Intimate Partner Violence: Not At Risk (03/31/2022)   Humiliation, Afraid, Rape, and Kick questionnaire    Fear of Current or Ex-Partner: No    Emotionally Abused: No    Physically Abused: No    Sexually Abused: No    Past Surgical History:  Procedure Laterality Date   ABDOMINAL HYSTERECTOMY Bilateral 1999   w/b/l salpingo-oopherectomy   APPENDECTOMY     BREAST LUMPECTOMY WITH NEEDLE LOCALIZATION AND AXILLARY SENTINEL LYMPH NODE BX Left 09/12/2012   Procedure: LEFT NEEDLE LOCALIZATION BREAST LUMPECTOMY TION AND AXILLARY SENTINEL LYMPH NODE BX;  Surgeon: Edward Jolly, MD;  Location: [facility];  Service: General;  Laterality: Left;   BREAST LUMPECTOMY WITH RADIOACTIVE SEED AND SENTINEL LYMPH NODE BIOPSY Right 10/26/2017   Procedure: BREAST LUMPECTOMY WITH RADIOACTIVE SEED AND SENTINEL LYMPH NODE BIOPSY;  Surgeon: Excell Seltzer, MD;  Location: [facility];  Service: General;  Laterality: Right;   BREAST SURGERY  1990's   right- fibroid cyst   CHOLECYSTECTOMY     SEPTOPLASTY     TONSILLECTOMY      Family History  Problem Relation Age of Onset   Hypertension Mother    Heart disease Mother        Murmur and irregular heart disease   Dementia Mother        d. 41   Hypertension Father    Aneurysm Father 15   Breast cancer Cousin 87       mat first cousin    Allergies  Allergen Reactions   Penicillins Hives    Has patient had a PCN reaction causing immediate rash, facial/tongue/throat swelling, SOB or lightheadedness with hypotension: No Has patient had a PCN reaction causing severe rash involving mucus membranes or skin necrosis: No Has patient had a PCN reaction that required hospitalization: No Has patient had a PCN reaction  occurring within the last 10 years: No If all of the above answers are "NO", then may proceed with Cephalosporin use.   Other Hives   Prednisone Other (See Comments)    ELEVATED BLOOD SUGAR   Radiaplexrx [Wound Dressings] Hives   Sulfamethoxazole Nausea Only    Current Outpatient Medications on File Prior to Visit  Medication Sig Dispense Refill   Accu-Chek Softclix Lancets lancets Use to test up to 4 times daily. 100 each 12  allopurinol (ZYLOPRIM) 300 MG tablet Take 1 tablet (300 mg total) by mouth daily. 90 tablet 3   atenolol (TENORMIN) 25 MG tablet TAKE 1 TABLET BY MOUTH EVERY DAY 90 tablet 3   blood glucose meter kit and supplies KIT Dispense based on patient and insurance preference. Use up to four times daily as directed. 1 each 0   chlorpheniramine (CHLOR-TRIMETON) 4 MG tablet Take 4 mg 2 (two) times daily as needed by mouth for allergies.     diltiazem (CARDIZEM CD) 360 MG 24 hr capsule TAKE 1 CAPSULE BY MOUTH EVERY DAY 90 capsule 0   ELIQUIS 2.5 MG TABS tablet TAKE 1 TABLET BY MOUTH TWICE A DAY 180 tablet 1   fish oil-omega-3 fatty acids 1000 MG capsule Take 1 g by mouth daily.     glucose blood (ACCU-CHEK GUIDE) test strip TEST UP TO FOUR TIMES A DAY 100 strip 3   metFORMIN (GLUCOPHAGE) 500 MG tablet Take 1 tablet (500 mg total) by mouth 2 (two) times daily with a meal. 180 tablet 3   Multiple Vitamin (MULTIVITAMIN) tablet Take 1 tablet by mouth daily.     OVER THE COUNTER MEDICATION Take by mouth 2 (two) times daily. Presavision- for macular degeneration prevention     phenazopyridine (PYRIDIUM) 95 MG tablet Take 95 mg by mouth 3 (three) times daily as needed for pain.     rosuvastatin (CRESTOR) 5 MG tablet Take 1 tablet (5 mg total) by mouth daily. 90 tablet 3   VITAMIN D, ERGOCALCIFEROL, PO Take by mouth.     No current facility-administered medications on file prior to visit.    BP 120/80   Pulse 88   Temp (!) 97.1 F (36.2 C) (Oral)   Ht _0  (1.549 m)   Wt 121  lb (54.9 kg)   SpO2 96%   BMI 22.86 kg/m       Objective:   Physical Exam Vitals and nursing note reviewed.  Constitutional:      Appearance: Normal appearance.  Cardiovascular:     Rate and Rhythm: Normal rate and regular rhythm.     Pulses: Normal pulses.     Heart sounds: Normal heart sounds.  Pulmonary:     Effort: Pulmonary effort is normal.     Breath sounds: Normal breath sounds.  Musculoskeletal:        General: Normal range of motion.  Skin:    General: Skin is warm and dry.  Neurological:     General: No focal deficit present.     Mental Status: She is alert and oriented to person, place, and time.  Psychiatric:        Mood and Affect: Mood normal.        Behavior: Behavior normal.        Thought Content: Thought content normal.        Judgment: Judgment normal.       Assessment & Plan:  1. Diabetes 1.5, managed as type 2 ([place])  - POC HgB A1c- 7.6 - increased but ok for age. Will have her cut back on carbs and sugars. Continue with metformin 500 mg BID  - Follow up in Feb for CPE   2. Essential hypertension - well controlled. No change in medication   [name], NP

## 2022-04-08 ENCOUNTER — Ambulatory Visit: Payer: Medicare PPO | Admitting: Adult Health

## 2022-04-08 ENCOUNTER — Encounter: Payer: Self-pay | Admitting: Adult Health

## 2022-04-08 VITALS — BP 140/80 | HR 85 | Temp 97.9°F | Ht 61.0 in | Wt 123.0 lb

## 2022-04-08 DIAGNOSIS — H6123 Impacted cerumen, bilateral: Secondary | ICD-10-CM | POA: Diagnosis not present

## 2022-04-08 NOTE — Progress Notes (Signed)
Subjective:    Patient ID: Adrienne [name], female    DOB: 07-14-1934, 87 y.o.   MRN: [id number]  HPI 87 year old female who  has a past medical history of Allergy, Atrial fibrillation ([place]), Breast cancer ([place]) (08/25/12), Diabetes mellitus, Family history of cancer, Gout, Hearing loss, History of breast cancer, History of frequent urinary tract infections, History of radiation therapy (03/21/18- 04/19/18), radiation therapy (11/22/12- 12/19/12), Hyperlipidemia, Hypertension, and Osteopenia.  She presents to the office today for bilateral cerumen impaction. She went to the audiologist and was advised that her ears were " filled with wax".    Review of Systems See HPI   Past Medical History:  Diagnosis Date   Allergy    Atrial fibrillation ([place])    Breast cancer ([place]) 08/25/12   Invasive ductal ca,DCIS   Diabetes mellitus    type II   Family history of cancer    Gout    Hearing loss    History of breast cancer    History of frequent urinary tract infections    History of radiation therapy 03/21/18- 04/19/18   Right Breast 2.67 Gy X 15 fractions, right breast boost 2 Gy X 5 fractions.    Hx of radiation therapy 11/22/12- 12/19/12   breast 4250 cGy 17 sessions, left breast boost 750 cGy 3 sessions   Hyperlipidemia    Hypertension    Osteopenia     Social History   Socioeconomic History   Marital status: Married    Spouse name: Not on file   Number of children: 3   Years of education: Not on file   Highest education level: Not on file  Occupational History   Not on file  Tobacco Use   Smoking status: Former    Packs/day: 0.25    Years: 4.00    Total pack years: 1.00    Types: Cigarettes   Smokeless tobacco: Never   Tobacco comments:    Cigarette use was 50 years ago  Vaping Use   Vaping Use: Never used  Substance and Sexual Activity   Alcohol use: Not Currently   Drug use: No   Sexual activity: Not Currently    Comment: menarche age 72, 67st birth age 55, G7, HRT x  7 years?  Other Topics Concern   Not on file  Social History Narrative   Married - husband in memory care unit    Three children ( One lives at [place] and two in [place]      She likes to read and go to [employer]    Social Determinants of Health   Financial Resource Strain: Low Risk  (03/31/2022)   Overall Financial Resource Strain (CARDIA)    Difficulty of Paying Living Expenses: Not hard at all  Food Insecurity: No Food Insecurity (03/31/2022)   Hunger Vital Sign    Worried About Running Out of Food in the Last Year: Never true    Ran Out of Food in the Last Year: Never true  Transportation Needs: No Transportation Needs (03/31/2022)   PRAPARE - Hydrologist (Medical): No    Lack of Transportation (Non-Medical): No  Physical Activity: Sufficiently Active (03/31/2022)   Exercise Vital Sign    Days of Exercise per Week: 7 days    Minutes of Exercise per Session: 90 min  Stress: No Stress Concern Present (03/31/2022)   [place]    Feeling of Stress : Not at  all  Social Connections: Moderately Integrated (03/31/2022)   Social Connection and Isolation Panel [NHANES]    Frequency of Communication with Friends and Family: More than three times a week    Frequency of Social Gatherings with Friends and Family: More than three times a week    Attends Religious Services: More than 4 times per year    Active Member of [employer] or Organizations: Yes    Attends Archivist Meetings: More than 4 times per year    Marital Status: Widowed  Intimate Partner Violence: Not At Risk (03/31/2022)   Humiliation, Afraid, Rape, and Kick questionnaire    Fear of Current or Ex-Partner: No    Emotionally Abused: No    Physically Abused: No    Sexually Abused: No    Past Surgical History:  Procedure Laterality Date   ABDOMINAL HYSTERECTOMY Bilateral 1999   w/b/l salpingo-oopherectomy    APPENDECTOMY     BREAST LUMPECTOMY WITH NEEDLE LOCALIZATION AND AXILLARY SENTINEL LYMPH NODE BX Left 09/12/2012   Procedure: LEFT NEEDLE LOCALIZATION BREAST LUMPECTOMY TION AND AXILLARY SENTINEL LYMPH NODE BX;  Surgeon: Edward Jolly, MD;  Location: [facility];  Service: General;  Laterality: Left;   BREAST LUMPECTOMY WITH RADIOACTIVE SEED AND SENTINEL LYMPH NODE BIOPSY Right 10/26/2017   Procedure: BREAST LUMPECTOMY WITH RADIOACTIVE SEED AND SENTINEL LYMPH NODE BIOPSY;  Surgeon: Excell Seltzer, MD;  Location: [facility];  Service: General;  Laterality: Right;   BREAST SURGERY  1990's   right- fibroid cyst   CHOLECYSTECTOMY     SEPTOPLASTY     TONSILLECTOMY      Family History  Problem Relation Age of Onset   Hypertension Mother    Heart disease Mother        Murmur and irregular heart disease   Dementia Mother        d. 3   Hypertension Father    Aneurysm Father 34   Breast cancer Cousin 77       mat first cousin    Allergies  Allergen Reactions   Penicillins Hives    Has patient had a PCN reaction causing immediate rash, facial/tongue/throat swelling, SOB or lightheadedness with hypotension: No Has patient had a PCN reaction causing severe rash involving mucus membranes or skin necrosis: No Has patient had a PCN reaction that required hospitalization: No Has patient had a PCN reaction occurring within the last 10 years: No If all of the above answers are "NO", then may proceed with Cephalosporin use.   Other Hives   Prednisone Other (See Comments)    ELEVATED BLOOD SUGAR   Radiaplexrx [Wound Dressings] Hives   Sulfamethoxazole Nausea Only    Current Outpatient Medications on File Prior to Visit  Medication Sig Dispense Refill   Accu-Chek Softclix Lancets lancets Use to test up to 4 times daily. 100 each 12   allopurinol (ZYLOPRIM) 300 MG tablet Take 1 tablet (300 mg total) by mouth daily. 90 tablet 3   atenolol (TENORMIN) 25 MG tablet TAKE 1 TABLET BY  MOUTH EVERY DAY 90 tablet 3   blood glucose meter kit and supplies KIT Dispense based on patient and insurance preference. Use up to four times daily as directed. 1 each 0   chlorpheniramine (CHLOR-TRIMETON) 4 MG tablet Take 4 mg 2 (two) times daily as needed by mouth for allergies.     diltiazem (CARDIZEM CD) 360 MG 24 hr capsule TAKE 1 CAPSULE BY MOUTH EVERY DAY 90 capsule 0   ELIQUIS 2.5  MG TABS tablet TAKE 1 TABLET BY MOUTH TWICE A DAY 180 tablet 1   fish oil-omega-3 fatty acids 1000 MG capsule Take 1 g by mouth daily.     glucose blood (ACCU-CHEK GUIDE) test strip TEST UP TO FOUR TIMES A DAY 100 strip 3   metFORMIN (GLUCOPHAGE) 500 MG tablet Take 1 tablet (500 mg total) by mouth 2 (two) times daily with a meal. 180 tablet 3   Multiple Vitamin (MULTIVITAMIN) tablet Take 1 tablet by mouth daily.     OVER THE COUNTER MEDICATION Take by mouth 2 (two) times daily. Presavision- for macular degeneration prevention     phenazopyridine (PYRIDIUM) 95 MG tablet Take 95 mg by mouth 3 (three) times daily as needed for pain.     rosuvastatin (CRESTOR) 5 MG tablet Take 1 tablet (5 mg total) by mouth daily. 90 tablet 3   VITAMIN D, ERGOCALCIFEROL, PO Take by mouth.     No current facility-administered medications on file prior to visit.    BP (!) 140/80   Pulse 85   Temp 97.9 F (36.6 C) (Oral)   Ht 5' 1" (1.549 m)   Wt 123 lb (55.8 kg)   SpO2 96%   BMI 23.24 kg/m       Objective:   Physical Exam Vitals and nursing note reviewed.  Constitutional:      Appearance: Normal appearance.  HENT:     Right Ear: Tympanic membrane, ear canal and external ear normal. There is impacted cerumen.     Left Ear: Tympanic membrane, ear canal and external ear normal. There is impacted cerumen.  Cardiovascular:     Rate and Rhythm: Normal rate and regular rhythm.  Musculoskeletal:        General: Normal range of motion.  Skin:    General: Skin is warm and dry.     Capillary Refill: Capillary refill  takes less than 2 seconds.  Neurological:     General: No focal deficit present.     Mental Status: She is alert and oriented to person, place, and time.  Psychiatric:        Mood and Affect: Mood normal.        Behavior: Behavior normal.        Thought Content: Thought content normal.        Judgment: Judgment normal.       Assessment & Plan:  1. Bilateral hearing loss due to cerumen impaction Verbal consent obtained. Warm water was applied and gentle ear lavage performed bilaterally. There were no complications and following the disimpaction the tympanic membrane were visible bilaterally. Tympanic membranes are intact following the procedure.  Auditory canals are normal.  The patient reported relief of symptoms after removal of cerumen. Patient tolerated procedure well.   [name], NP

## 2022-04-18 ENCOUNTER — Other Ambulatory Visit: Payer: Self-pay | Admitting: Cardiology

## 2022-04-19 NOTE — Progress Notes (Unsigned)
[place]   [phone] [phone]   Clinic Follow up Note   Patient Care Team: [name], NP as PCP - General (Family Medicine) Stanford Breed Denice Bors, MD as PCP - Cardiology (Cardiology) Adrienne Feeling, NP as Nurse Practitioner (Hematology and Oncology) 04/23/2022  CHIEF COMPLAINT: Follow up left breast cancer   SUMMARY OF ONCOLOGIC HISTORY: Oncology History Overview Note  Cancer Staging Breast cancer of upper-outer quadrant of right female breast [facility]) Staging form: Breast, AJCC 8th Edition - Clinical stage from 09/22/2017: Stage IB (cT1b, cN0, cM0, G2, ER-, PR-, HER2-) - Signed by Truitt Merle, MD on 10/08/2017  Breast cancer, left breast [facility]) Staging form: Breast, AJCC 7th Edition - Clinical stage from 09/12/2012: Stage IA (T1b, N0, M0) - Unsigned      Breast cancer, left breast ([place])  08/25/2012 Receptors her2   ER 100% positive, PR 88% positive, HER-2 negative, Ki-67 16%   08/30/2012 Initial Diagnosis   Breast cancer, left breast   09/12/2012 Pathology Results   T1bN0 Grade 1 invasive ductal carcinoma, and DCIS.   09/12/2012 Surgery   Left breast lumpectomy and sentinel lymph node biopsy, negative margins.   11/09/2012 - 12/13/2012 Radiation Therapy   Adjuvant breast radiation   12/2012 - 11/2017 Anti-estrogen oral therapy   Anastrozole 1 mg daily, switched to Aromasin 25 mg daily in Jan 2015 due to diarrhea. Her Exemestane was switched to letrozole in 08/2017 due to high copay. She completed her 5 years in 11/2017.    09/01/2016 Imaging   [name] Outside films Breast form [facility] 09/01/2016 IMPRESSION: Probably Benign 1. No significant change in oval hypoechoic masses in the left breast at 11:00 2 cm from the nipple and 10:30 4 cm from the nipple. Both of these masses resemble the area of fat necrosis previously biopsies at 2:00. In addition, both masses have developing central benign or dystrophic appearing calcifications and are favored to be  other areas of fat necrosis. A 6 month follow-up left breast mammogram and ultrasound is recommended.   09/01/2016 Mammogram   [facility] Mammogram from [facility] 09/01/16 IMPRESSION  Incomplete - additional imaging evaluation needed Ultrasound of the upper medial left breast mass is recommended.    03/09/2017 Mammogram   Previous lumpectomy changes in the upper outer left breast anterior depth with stable associated dystrophic calcifications.  Biopsy clip at the 2:00 in the left breast near the lumpectomy site corresponding to previously biopsied benign fat necrosis.  Persistent round mass in the upper medial left breast with benign-appearing central round calcification.  No significant masses, calcifications, or other findings are seen in the breast  Impression: ultrasound is recommended for the left breast   03/09/2017 Breast US   Left breast ultrasound impression:  No significant change in oval hypoechoic masses in the left breast at 11:00 2 cm from the nipple and 10:30 4 cm from the nipple.  Both of these masses resemble the area of fat necrosis previously biopsied at 2:00.  In addition, both masses have developing central benign or dystrophic appearing calcifications and are favored to be other areas of fat necrosis.  A 35-monthfollow-up bilateral mammogram and left breast ultrasound is recommended.   09/14/2017 Breast [place]  Breast [place] 09[place]14[place]17 at [facility]  IMPRESSION:  The 1 cm oval mass in the left breast at 9:30 posterior depth is highly suggestive of malignancy. An Ultrasound guided biopsy is recommended.  The 0.8 cm round mass in the left breast at 11-12 o'clock anterior  depth is suspicious of malignancy. An ultrasound guided biopsy is recommended.    09/22/2017 Pathology Results   Diagnosis 1. Breast, right, needle core biopsy - INVASIVE DUCTAL CARCINOMA, MSBR GRADE II/III. - DUCTAL CARCINOMA IN SITU WITH NECROSIS. 2. Breast, left, needle core biopsy - FAT NECROSIS. - NO EVIDENCE OF  MALIGNANCY.   10/17/2017 Genetic Testing   Negative genetic testing on the Multicancer panel.  The Multi-Gene Panel offered by [facility] includes sequencing and/or deletion duplication testing of the following 83 genes: ALK, APC, ATM, AXIN2,BAP1,  BARD1, BLM, BMPR1A, BRCA1, BRCA2, BRIP1, CASR, CDC73, CDH1, CDK4, CDKN1B, CDKN1C, CDKN2A (p14ARF), CDKN2A (p16INK4a), CEBPA, CHEK2, CTNNA1, DICER1, DIS3L2, EGFR (c.2369C>T, p.Thr790Met variant only), EPCAM (Deletion/duplication testing only), FH, FLCN, GATA2, GPC3, GREM1 (Promoter region deletion/duplication testing only), HOXB13 (c.251G>A, p.Gly84Glu), HRAS, KIT, MAX, MEN1, MET, MITF (c.952G>A, p.Glu318Lys variant only), MLH1, MSH2, MSH3, MSH6, MUTYH, NBN, NF1, NF2, NTHL1, PALB2, PDGFRA, PHOX2B, PMS2, POLD1, POLE, POT1, PRKAR1A, PTCH1, PTEN, RAD50, RAD51C, RAD51D, RB1, RECQL4, RET, RUNX1, SDHAF2, SDHA (sequence changes only), SDHB, SDHC, SDHD, SMAD4, SMARCA4, SMARCB1, SMARCE1, STK11, SUFU, TERT, TERT, TMEM127, TP53, TSC1, TSC2, VHL, WRN and WT1.  The report date is October 17, 2017.   Breast cancer of upper-outer quadrant of right female breast ([place]) (Resolved)  09/14/2017 Mammogram   Diagnostic mammogram at [facility]  IMPRESSION:  The new 0.9 cm oval high density mass in the right breast posterior depth superior region seen on the mediolateral oblique view only is indeterminate. The 1.1 cm focal asymmetry in the left breast central to the nipple anterior depth is indeterminate.    09/14/2017 Imaging   [name] Bilateral at [facility] IMPRESSION: The 1 cm oval mass in the right breast at 9:30 posterior depth is highly suggestive of malignancy. The 0.8 cm round mass in the left breast at 11-12 o'clock anterior depth is suspicious of malignancy.    09/22/2017 Cancer Staging   Staging form: Breast, AJCC 8th Edition - Clinical stage from 09/22/2017: Stage IB (cT1b, cN0, cM0, G2, ER-, PR-, HER2-) - Signed by Truitt Merle, MD on 10/08/2017   09/22/2017 Pathology Results   Bilateral  needle core biopsy 1. Breast, right, needle core biopsy - INVASIVE DUCTAL CARCINOMA, MSBR GRADE II/III. - DUCTAL CARCINOMA IN SITU WITH NECROSIS. 2. Breast, left, needle core biopsy - FAT NECROSIS. - NO EVIDENCE OF MALIGNANCY.   09/25/2017 Receptors her2   Estrogen Receptor: 0 Progesterone 0 HER2: Negative. Ki-67: 80%    10/08/2017 Initial Diagnosis   Breast cancer of upper-outer quadrant of right female breast ([place])   10/26/2017 Surgery   BREAST LUMPECTOMY WITH RADIOACTIVE SEED AND SENTINEL LYMPH NODE BIOPSY by Dr. Excell Seltzer  10/26/17   10/26/2017 Pathology Results   Diagnosis 10/26/17 1. Breast, lumpectomy, Right - INVASIVE DUCTAL CARCINOMA, GRADE 2, SPANNING 1 CM. - HIGH GRADE DUCTAL CARCINOMA IN SITU WITH NECROSIS. - FINAL RESECTION MARGINS (PARTS #2, 3, 4) ARE NEGATIVE. - BIOPSY SITE. - SEE ONCOLOGY TABLE. 2. Breast, excision, additional anterior margin- 2 pieces right - BENIGN BREAST TISSUE. 3. Breast, excision, Right unoriented anterior margin - BENIGN BREAST TISSUE. 4. Breast, excision, Right deep margin - BENIGN BREAST TISSUE. 5. Lymph node, sentinel, biopsy, Right Axillary - ONE OF ONE LYMPH NODES NEGATIVE FOR CARCINOMA (0/1).   10/26/2017 Cancer Staging   Staging form: Breast, AJCC 8th Edition - Pathologic stage from 10/26/2017: Stage IB (pT1b, pN0, cM0, G2, ER-, PR-, HER2-) - Signed by Truitt Merle, MD on 11/12/2017   12/03/2017 - 02/25/2018 Chemotherapy   Weekly Abraxane for  12 weeks 12/03/17-02/25/18   03/2018 - 04/19/2018 Radiation Therapy   With Dr. Isidore Moos     CURRENT THERAPY: Surveillance   INTERVAL HISTORY: Ms. Looman returns for follow up as scheduled. Last seen by me 10/21/21. Mammogran 12/08/21 showed 1.7 cm mass in right breast and a suspicious 1.1 x3 cm density along the lumpectomy site of left breast. Biopsy of both sites showed scar with fat necrosis, negative for malignancy. DEXA 12/25/21 showed osteopenia, lowest T score -2.2. she took fosamax for many years  in the past.   Overall she is doing well. Feels "wobbly" at times and uses a walking stick when she gets tired in the evenings. She does balance class, yoga, and cardio most days of the week. Denies dizziness, headache, or fall. Denies concerns in her breast such as new lump/mass, nipple discharge/inversion, or skin change. Denies new pain or any other new/specific complaints.   All other systems were reviewed with the patient and are negative.  MEDICAL HISTORY:  Past Medical History:  Diagnosis Date   Allergy    Atrial fibrillation ([place])    Breast cancer ([place]) 08/25/12   Invasive ductal ca,DCIS   Diabetes mellitus    type II   Family history of cancer    Gout    Hearing loss    History of breast cancer    History of frequent urinary tract infections    History of radiation therapy 03/21/18- 04/19/18   Right Breast 2.67 Gy X 15 fractions, right breast boost 2 Gy X 5 fractions.    Hx of radiation therapy 11/22/12- 12/19/12   breast 4250 cGy 17 sessions, left breast boost 750 cGy 3 sessions   Hyperlipidemia    Hypertension    Osteopenia     SURGICAL HISTORY: Past Surgical History:  Procedure Laterality Date   ABDOMINAL HYSTERECTOMY Bilateral 1999   w/b/l salpingo-oopherectomy   APPENDECTOMY     BREAST LUMPECTOMY WITH NEEDLE LOCALIZATION AND AXILLARY SENTINEL LYMPH NODE BX Left 09/12/2012   Procedure: LEFT NEEDLE LOCALIZATION BREAST LUMPECTOMY TION AND AXILLARY SENTINEL LYMPH NODE BX;  Surgeon: Edward Jolly, MD;  Location: [facility];  Service: General;  Laterality: Left;   BREAST LUMPECTOMY WITH RADIOACTIVE SEED AND SENTINEL LYMPH NODE BIOPSY Right 10/26/2017   Procedure: BREAST LUMPECTOMY WITH RADIOACTIVE SEED AND SENTINEL LYMPH NODE BIOPSY;  Surgeon: Excell Seltzer, MD;  Location: [facility];  Service: General;  Laterality: Right;   BREAST SURGERY  1990's   right- fibroid cyst   CHOLECYSTECTOMY     SEPTOPLASTY     TONSILLECTOMY      I have reviewed the  social history and family history with the patient and they are unchanged from previous note.  ALLERGIES:  is allergic to penicillins, other, prednisone, radiaplexrx [wound dressings], and sulfamethoxazole.  MEDICATIONS:  Current Outpatient Medications  Medication Sig Dispense Refill   Accu-Chek Softclix Lancets lancets Use to test up to 4 times daily. 100 each 12   allopurinol (ZYLOPRIM) 300 MG tablet Take 1 tablet (300 mg total) by mouth daily. 90 tablet 3   atenolol (TENORMIN) 25 MG tablet TAKE 1 TABLET BY MOUTH EVERY DAY 90 tablet 3   blood glucose meter kit and supplies KIT Dispense based on patient and insurance preference. Use up to four times daily as directed. 1 each 0   chlorpheniramine (CHLOR-TRIMETON) 4 MG tablet Take 4 mg 2 (two) times daily as needed by mouth for allergies.     diltiazem (CARDIZEM CD) 360 MG 24  hr capsule TAKE 1 CAPSULE BY MOUTH EVERY DAY 90 capsule 3   ELIQUIS 2.5 MG TABS tablet TAKE 1 TABLET BY MOUTH TWICE A DAY 180 tablet 1   fish oil-omega-3 fatty acids 1000 MG capsule Take 1 g by mouth daily.     glucose blood (ACCU-CHEK GUIDE) test strip TEST UP TO FOUR TIMES A DAY 100 strip 3   metFORMIN (GLUCOPHAGE) 500 MG tablet Take 1 tablet (500 mg total) by mouth 2 (two) times daily with a meal. 180 tablet 3   Multiple Vitamin (MULTIVITAMIN) tablet Take 1 tablet by mouth daily.     OVER THE COUNTER MEDICATION Take by mouth 2 (two) times daily. Presavision- for macular degeneration prevention     rosuvastatin (CRESTOR) 5 MG tablet Take 1 tablet (5 mg total) by mouth daily. 90 tablet 3   VITAMIN D, ERGOCALCIFEROL, PO Take by mouth.     phenazopyridine (PYRIDIUM) 95 MG tablet Take 95 mg by mouth 3 (three) times daily as needed for pain. (Patient not taking: Reported on 04/23/2022)     No current facility-administered medications for this visit.    PHYSICAL EXAMINATION: ECOG PERFORMANCE STATUS: 0 - Asymptomatic  Vitals:   04/23/22 1026  BP: (!) 159/89  Pulse: 93   Resp: 18  Temp: (!) 97.4 F (36.3 C)  SpO2: 96%   Filed Weights   04/23/22 1026  Weight: 124 lb 1.6 oz (56.3 kg)    GENERAL:alert, no distress and comfortable SKIN: no rash  EYES:  sclera clear NECK: without mass LYMPH:  no palpable cervical or supraclavicular lymphadenopathy LUNGS: clear with normal breathing effort HEART: Afib, normal rate; no lower extremity edema ABDOMEN:abdomen soft, non-tender and normal bowel sounds NEURO: alert & oriented x 3 with fluent speech, no focal motor/sensory deficits Breast exam: Nipple inversion without discharge bilaterally S/p bilateral lumpectomies; moderate scar tissue and distortion without palpable mass or nodularity in either breast or axilla that I could appreciate   LABORATORY DATA:  I have reviewed the data as listed    Latest Ref Rng & Units 04/23/2022    9:43 AM 10/21/2021   10:05 AM 06/25/2021    3:04 PM  CBC  WBC 4.0 - 10.5 K/uL 8.1  7.1  8.2   Hemoglobin 12.0 - 15.0 g/dL 13.5  13.9  13.5   Hematocrit 36.0 - 46.0 % 41.4  41.7  41.0   Platelets 150 - 400 K/uL 159  160  177.0         Latest Ref Rng & Units 04/23/2022    9:43 AM 10/21/2021   10:05 AM 06/25/2021    3:04 PM  CMP  Glucose 70 - 99 mg/dL 197  188  143   BUN 8 - 23 mg/dL _0 Creatinine 0.44 - 1.00 mg/dL 1.09  1.11  1.07   Sodium 135 - 145 mmol/L 141  140  139   Potassium 3.5 - 5.1 mmol/L 3.9  4.0  4.1   Chloride 98 - 111 mmol/L 105  106  104   CO2 22 - 32 mmol/L _1 Calcium 8.9 - 10.3 mg/dL 9.8  10.1  9.7   Total Protein 6.5 - 8.1 g/dL 7.1  7.1  7.0   Total Bilirubin 0.3 - 1.2 mg/dL 0.6  0.7  0.7   Alkaline Phos 38 - 126 U/L 96  79  91   AST 15 - 41 U/L 19  20  17  ALT 0 - 44 U/L _0 RADIOGRAPHIC STUDIES: I have personally reviewed the radiological images as listed and agreed with the findings in the report. No results found.   ASSESSMENT & PLAN:  87 y.o. [place], [place] woman    1. Breast cancer of upper outer  quadrant of right breast, invasive ductal carcinoma, stage IB, pT1bN0M0, grade 2, Triple Negative, Ki67: 80% -Diagnosed in 09/2017, s/p right lumpectomy with SLNB, 12 weeks of adjuvant Abraxane, and adjuvant radiation  - genetic testing was negative -I reviewed most recent mammogram 12/08/21 which showed 1.7 cm mass in right breast and a suspicious 1.1 x3 cm density along the lumpectomy site of left breast. Biopsy of both sites showed scar with fat necrosis, negative for malignancy.  -Ms. Bickert is clinically doing well. Breast exam is benign. Labs are unremarkable. No clinical concern for recurrence -She is over 4 years from definitive surgery, continue surveillance -F/up in 6 months, or sooner if needed   2. pT1bN0, stage IA invasive ductal carcinoma of the left breast, grade I, ER 100%, PR 88%, Ki-67 16%, HER-2/neu negative. -Diagnosed in 09/2012, s/p lumpectomy with SLNB 09/12/2012, adjuvant radiation, and adjuvant antiestrogen therapy with exemestane from 12/2012-11/2017 -Abnormal mammogram on 10/2019, biopsy 10/25/2019 showed benign fat necrosis with calcifications   3. Osteopenia -She is on vitamin D supplement daily; she stopped calcium due to hypercalcemia -DEXA 09/2017  and 01/2020 showed osteopenia and very high risk of fracture, 29% for major osteoporotic fracture and 18% for hip fracture.  This has been stable over the last 2 years - Dr. Burr Medico and I both have previously discussed bisphosphonate vs Prolia, due to mild renal dysfunction prolia would be safer. She has been reluctant due to side effects.  -Repeat DEXA 12/2021 with lowest T score -2.2, and FRAX score of 30 % risk of major osteoporotic fracture and 20% risk of hip fracture in 10 years. This is very high risk -She ultimately agreed to start Prolia inj q6 months, prefers to do at PCP office which is closer to her. I will cc my note to [name] NP -Continue calcium, vit D and weight bearing exercise    4. Back pain, arthritis  -She  has h/o left hip pain secondary to arthritis and bursitis.  -04/22/2021 she recently developed right mid-low back pain she attributes to sleeping incorrectly positioned. She had mild ttp on exam. This is likely MSK, secondary to rowing or positional.  -she has imaging that shows bilateral degenerative hip disease -also has sciatica which is stable -Continue exercise and stretching   5. Diabetes, HTN, CKD, Afib -She had elevated blood sugar when on steroids for hip pain, DC'd steroids  -Med adjustments per PCP -stable   6. Social Support -She lives at [facility] living and is very independent -Her children live close by her and she has a great grandson   PLAN: -Recent mammogram/US, breast biopsy, DEXA, and today's labs reviewed -Continue breast cancer surveillance -Start Prolia inj q6 months for osteopenia and high fracture risk, pt prefers to do at PCP; I will fax my note -F/up in 6 months, or sooner if needed    All questions were answered. The patient knows to call the clinic with any problems, questions or concerns. No barriers to learning was detected. I spent 20 minutes counseling the patient face to face. The total time spent in the appointment was 30 minutes and more than 50% was on counseling  and review of test results.     Adrienne Feeling, NP 04/23/22

## 2022-04-21 ENCOUNTER — Telehealth: Payer: Self-pay | Admitting: Adult Health

## 2022-04-21 NOTE — Telephone Encounter (Signed)
Patient wants advice as to whether she should take the RSV vaccination

## 2022-04-22 ENCOUNTER — Other Ambulatory Visit: Payer: Self-pay

## 2022-04-22 DIAGNOSIS — Z170 Estrogen receptor positive status [ER+]: Secondary | ICD-10-CM

## 2022-04-22 DIAGNOSIS — Z171 Estrogen receptor negative status [ER-]: Secondary | ICD-10-CM

## 2022-04-22 NOTE — Telephone Encounter (Signed)
Patient notified that Adrienne [name] recommends anyone the age of 68 and up to received the vaccine base of CDC guidelines. PT verbalized understanding.

## 2022-04-23 ENCOUNTER — Inpatient Hospital Stay: Payer: Medicare PPO | Attending: Nurse Practitioner | Admitting: Nurse Practitioner

## 2022-04-23 ENCOUNTER — Inpatient Hospital Stay: Payer: Medicare PPO

## 2022-04-23 ENCOUNTER — Other Ambulatory Visit: Payer: Self-pay

## 2022-04-23 ENCOUNTER — Encounter: Payer: Self-pay | Admitting: Nurse Practitioner

## 2022-04-23 VITALS — BP 159/89 | HR 93 | Temp 97.4°F | Resp 18 | Wt 124.1 lb

## 2022-04-23 DIAGNOSIS — N189 Chronic kidney disease, unspecified: Secondary | ICD-10-CM | POA: Diagnosis not present

## 2022-04-23 DIAGNOSIS — Z171 Estrogen receptor negative status [ER-]: Secondary | ICD-10-CM | POA: Diagnosis not present

## 2022-04-23 DIAGNOSIS — C50411 Malignant neoplasm of upper-outer quadrant of right female breast: Secondary | ICD-10-CM | POA: Diagnosis not present

## 2022-04-23 DIAGNOSIS — C50412 Malignant neoplasm of upper-outer quadrant of left female breast: Secondary | ICD-10-CM | POA: Diagnosis not present

## 2022-04-23 DIAGNOSIS — I129 Hypertensive chronic kidney disease with stage 1 through stage 4 chronic kidney disease, or unspecified chronic kidney disease: Secondary | ICD-10-CM | POA: Insufficient documentation

## 2022-04-23 DIAGNOSIS — Z923 Personal history of irradiation: Secondary | ICD-10-CM | POA: Insufficient documentation

## 2022-04-23 DIAGNOSIS — Z853 Personal history of malignant neoplasm of breast: Secondary | ICD-10-CM | POA: Insufficient documentation

## 2022-04-23 DIAGNOSIS — Z170 Estrogen receptor positive status [ER+]: Secondary | ICD-10-CM | POA: Diagnosis not present

## 2022-04-23 DIAGNOSIS — E1122 Type 2 diabetes mellitus with diabetic chronic kidney disease: Secondary | ICD-10-CM | POA: Insufficient documentation

## 2022-04-23 DIAGNOSIS — I4891 Unspecified atrial fibrillation: Secondary | ICD-10-CM | POA: Insufficient documentation

## 2022-04-23 DIAGNOSIS — E1159 Type 2 diabetes mellitus with other circulatory complications: Secondary | ICD-10-CM | POA: Diagnosis not present

## 2022-04-23 DIAGNOSIS — M8580 Other specified disorders of bone density and structure, unspecified site: Secondary | ICD-10-CM | POA: Insufficient documentation

## 2022-04-23 DIAGNOSIS — Z79899 Other long term (current) drug therapy: Secondary | ICD-10-CM | POA: Diagnosis not present

## 2022-04-23 LAB — CBC WITH DIFFERENTIAL (CANCER CENTER ONLY)
Abs Immature Granulocytes: 0.04 10*3/uL (ref 0.00–0.07)
Basophils Absolute: 0.1 10*3/uL (ref 0.0–0.1)
Basophils Relative: 1 %
Eosinophils Absolute: 0.1 10*3/uL (ref 0.0–0.5)
Eosinophils Relative: 1 %
HCT: 41.4 % (ref 36.0–46.0)
Hemoglobin: 13.5 g/dL (ref 12.0–15.0)
Immature Granulocytes: 1 %
Lymphocytes Relative: 19 %
Lymphs Abs: 1.5 10*3/uL (ref 0.7–4.0)
MCH: 31.5 pg (ref 26.0–34.0)
MCHC: 32.6 g/dL (ref 30.0–36.0)
MCV: 96.7 fL (ref 80.0–100.0)
Monocytes Absolute: 0.7 10*3/uL (ref 0.1–1.0)
Monocytes Relative: 8 %
Neutro Abs: 5.7 10*3/uL (ref 1.7–7.7)
Neutrophils Relative %: 70 %
Platelet Count: 159 10*3/uL (ref 150–400)
RBC: 4.28 MIL/uL (ref 3.87–5.11)
RDW: 14.5 % (ref 11.5–15.5)
WBC Count: 8.1 10*3/uL (ref 4.0–10.5)
nRBC: 0 % (ref 0.0–0.2)

## 2022-04-23 LAB — CMP (CANCER CENTER ONLY)
ALT: 16 U/L (ref 0–44)
AST: 19 U/L (ref 15–41)
Albumin: 4.2 g/dL (ref 3.5–5.0)
Alkaline Phosphatase: 96 U/L (ref 38–126)
Anion gap: 8 (ref 5–15)
BUN: 25 mg/dL — ABNORMAL HIGH (ref 8–23)
CO2: 28 mmol/L (ref 22–32)
Calcium: 9.8 mg/dL (ref 8.9–10.3)
Chloride: 105 mmol/L (ref 98–111)
Creatinine: 1.09 mg/dL — ABNORMAL HIGH (ref 0.44–1.00)
GFR, Estimated: 50 mL/min — ABNORMAL LOW (ref >60)
Glucose, Bld: 197 mg/dL — ABNORMAL HIGH (ref 70–99)
Potassium: 3.9 mmol/L (ref 3.5–5.1)
Sodium: 141 mmol/L (ref 135–145)
Total Bilirubin: 0.6 mg/dL (ref 0.3–1.2)
Total Protein: 7.1 g/dL (ref 6.5–8.1)

## 2022-04-25 ENCOUNTER — Other Ambulatory Visit: Payer: Self-pay | Admitting: Cardiology

## 2022-04-25 DIAGNOSIS — I4821 Permanent atrial fibrillation: Secondary | ICD-10-CM

## 2022-04-29 ENCOUNTER — Telehealth: Payer: Self-pay | Admitting: Hematology

## 2022-04-29 NOTE — Telephone Encounter (Signed)
Patient aware of upcoming appointments  

## 2022-05-03 ENCOUNTER — Other Ambulatory Visit: Payer: Self-pay | Admitting: Cardiology

## 2022-05-03 DIAGNOSIS — I4821 Permanent atrial fibrillation: Secondary | ICD-10-CM

## 2022-05-07 ENCOUNTER — Telehealth: Payer: Self-pay | Admitting: Adult Health

## 2022-05-07 NOTE — Telephone Encounter (Signed)
Patient thinks she has a UTI and would like something to treat. Says she has a history of UTIs. Declined OV

## 2022-05-07 NOTE — Telephone Encounter (Signed)
Please call pt to schedule an appt. Pt will an appt. Anytime asking for an antibiotic

## 2022-05-08 DIAGNOSIS — N390 Urinary tract infection, site not specified: Secondary | ICD-10-CM | POA: Diagnosis not present

## 2022-05-11 NOTE — Telephone Encounter (Signed)
Pt went to UC and was treated.

## 2022-06-19 ENCOUNTER — Telehealth: Payer: Self-pay | Admitting: Adult Health

## 2022-06-19 NOTE — Telephone Encounter (Signed)
Tried to call pt but no answer

## 2022-06-19 NOTE — Telephone Encounter (Signed)
C/o congestion, cough, eyes hurt. Was tested at [facility] and negative covid, medical personnel say it is likely her yearly allergies, requesting meds for treatment. Requesting a call.

## 2022-06-21 ENCOUNTER — Emergency Department (HOSPITAL_BASED_OUTPATIENT_CLINIC_OR_DEPARTMENT_OTHER)
Admission: EM | Admit: 2022-06-21 | Discharge: 2022-06-21 | Disposition: A | Discharge status: Home / Self Care | Payer: Medicare PPO | Attending: Emergency Medicine | Admitting: Emergency Medicine

## 2022-06-21 ENCOUNTER — Emergency Department (HOSPITAL_BASED_OUTPATIENT_CLINIC_OR_DEPARTMENT_OTHER): Payer: Medicare PPO

## 2022-06-21 ENCOUNTER — Other Ambulatory Visit: Payer: Self-pay

## 2022-06-21 ENCOUNTER — Encounter (HOSPITAL_BASED_OUTPATIENT_CLINIC_OR_DEPARTMENT_OTHER): Payer: Self-pay | Admitting: Emergency Medicine

## 2022-06-21 DIAGNOSIS — R931 Abnormal findings on diagnostic imaging of heart and coronary circulation: Secondary | ICD-10-CM | POA: Diagnosis not present

## 2022-06-21 DIAGNOSIS — Z7901 Long term (current) use of anticoagulants: Secondary | ICD-10-CM | POA: Diagnosis not present

## 2022-06-21 DIAGNOSIS — Z1152 Encounter for screening for COVID-19: Secondary | ICD-10-CM | POA: Insufficient documentation

## 2022-06-21 DIAGNOSIS — R197 Diarrhea, unspecified: Secondary | ICD-10-CM | POA: Insufficient documentation

## 2022-06-21 DIAGNOSIS — I482 Chronic atrial fibrillation, unspecified: Secondary | ICD-10-CM | POA: Diagnosis not present

## 2022-06-21 DIAGNOSIS — I517 Cardiomegaly: Secondary | ICD-10-CM | POA: Diagnosis not present

## 2022-06-21 DIAGNOSIS — R531 Weakness: Secondary | ICD-10-CM | POA: Diagnosis not present

## 2022-06-21 DIAGNOSIS — Z79899 Other long term (current) drug therapy: Secondary | ICD-10-CM | POA: Diagnosis not present

## 2022-06-21 DIAGNOSIS — J40 Bronchitis, not specified as acute or chronic: Secondary | ICD-10-CM | POA: Diagnosis not present

## 2022-06-21 DIAGNOSIS — E860 Dehydration: Secondary | ICD-10-CM | POA: Insufficient documentation

## 2022-06-21 DIAGNOSIS — I493 Ventricular premature depolarization: Secondary | ICD-10-CM | POA: Diagnosis not present

## 2022-06-21 DIAGNOSIS — R059 Cough, unspecified: Secondary | ICD-10-CM | POA: Diagnosis not present

## 2022-06-21 LAB — URINALYSIS, ROUTINE W REFLEX MICROSCOPIC
Bacteria, UA: NONE SEEN
Bilirubin Urine: NEGATIVE
Glucose, UA: NEGATIVE mg/dL
Hgb urine dipstick: NEGATIVE
Ketones, ur: 15 mg/dL — AB
Nitrite: NEGATIVE
Protein, ur: 30 mg/dL — AB
Specific Gravity, Urine: 1.016 (ref 1.005–1.030)
pH: 5.5 (ref 5.0–8.0)

## 2022-06-21 LAB — COMPREHENSIVE METABOLIC PANEL
ALT: 47 U/L — ABNORMAL HIGH (ref 0–44)
AST: 62 U/L — ABNORMAL HIGH (ref 15–41)
Albumin: 4.1 g/dL (ref 3.5–5.0)
Alkaline Phosphatase: 86 U/L (ref 38–126)
Anion gap: 14 (ref 5–15)
BUN: 24 mg/dL — ABNORMAL HIGH (ref 8–23)
CO2: 26 mmol/L (ref 22–32)
Calcium: 10.1 mg/dL (ref 8.9–10.3)
Chloride: 100 mmol/L (ref 98–111)
Creatinine, Ser: 1.08 mg/dL — ABNORMAL HIGH (ref 0.44–1.00)
GFR, Estimated: 50 mL/min — ABNORMAL LOW (ref >60)
Glucose, Bld: 106 mg/dL — ABNORMAL HIGH (ref 70–99)
Potassium: 3.7 mmol/L (ref 3.5–5.1)
Sodium: 140 mmol/L (ref 135–145)
Total Bilirubin: 0.6 mg/dL (ref 0.3–1.2)
Total Protein: 7.5 g/dL (ref 6.5–8.1)

## 2022-06-21 LAB — RESP PANEL BY RT-PCR (RSV, FLU A&B, COVID)  RVPGX2
Influenza A by PCR: NEGATIVE
Influenza B by PCR: NEGATIVE
Resp Syncytial Virus by PCR: NEGATIVE
SARS Coronavirus 2 by RT PCR: NEGATIVE

## 2022-06-21 LAB — LIPASE, BLOOD: Lipase: 36 U/L (ref 11–51)

## 2022-06-21 LAB — CBC WITH DIFFERENTIAL/PLATELET
Abs Immature Granulocytes: 0.05 K/uL (ref 0.00–0.07)
Basophils Absolute: 0 K/uL (ref 0.0–0.1)
Basophils Relative: 1 %
Eosinophils Absolute: 0.1 K/uL (ref 0.0–0.5)
Eosinophils Relative: 1 %
HCT: 42.3 % (ref 36.0–46.0)
Hemoglobin: 14.1 g/dL (ref 12.0–15.0)
Immature Granulocytes: 1 %
Lymphocytes Relative: 20 %
Lymphs Abs: 1.6 K/uL (ref 0.7–4.0)
MCH: 31.8 pg (ref 26.0–34.0)
MCHC: 33.3 g/dL (ref 30.0–36.0)
MCV: 95.5 fL (ref 80.0–100.0)
Monocytes Absolute: 1 K/uL (ref 0.1–1.0)
Monocytes Relative: 12 %
Neutro Abs: 5.4 K/uL (ref 1.7–7.7)
Neutrophils Relative %: 65 %
Platelets: 193 K/uL (ref 150–400)
RBC: 4.43 MIL/uL (ref 3.87–5.11)
RDW: 13.4 % (ref 11.5–15.5)
WBC: 8.1 K/uL (ref 4.0–10.5)
nRBC: 0 % (ref 0.0–0.2)

## 2022-06-21 LAB — TROPONIN I (HIGH SENSITIVITY): Troponin I (High Sensitivity): 11 ng/L (ref <18)

## 2022-06-21 LAB — BRAIN NATRIURETIC PEPTIDE: B Natriuretic Peptide: 251.5 pg/mL — ABNORMAL HIGH (ref 0.0–100.0)

## 2022-06-21 MED ORDER — ACETAMINOPHEN 500 MG PO TABS
1000.0000 mg | ORAL_TABLET | Freq: Once | ORAL | Status: DC
  Filled 2022-06-21: qty 2

## 2022-06-21 MED ORDER — LACTATED RINGERS IV BOLUS
500.0000 mL | Freq: Once | INTRAVENOUS | Status: AC
  Administered 2022-06-21: 500 mL via INTRAVENOUS

## 2022-06-21 MED ORDER — ATENOLOL 25 MG PO TABS
25.0000 mg | ORAL_TABLET | Freq: Once | ORAL | Status: AC
  Administered 2022-06-21: 25 mg via ORAL
  Filled 2022-06-21: qty 1

## 2022-06-21 MED ORDER — AZITHROMYCIN 250 MG PO TABS
500.0000 mg | ORAL_TABLET | Freq: Once | ORAL | Status: AC
  Administered 2022-06-21: 500 mg via ORAL
  Filled 2022-06-21: qty 2

## 2022-06-21 MED ORDER — ALBUTEROL SULFATE HFA 108 (90 BASE) MCG/ACT IN AERS
2.0000 | INHALATION_SPRAY | Freq: Once | RESPIRATORY_TRACT | Status: AC
  Administered 2022-06-21: 2 via RESPIRATORY_TRACT
  Filled 2022-06-21: qty 6.7

## 2022-06-21 MED ORDER — LACTATED RINGERS IV BOLUS: 500.0000 mL | Freq: Once | INTRAVENOUS | Status: DC

## 2022-06-21 MED ORDER — AZITHROMYCIN 250 MG PO TABS: ORAL_TABLET | ORAL | 0 refills | Status: DC

## 2022-06-21 MED ORDER — DILTIAZEM HCL ER COATED BEADS 120 MG PO CP24
360.0000 mg | ORAL_CAPSULE | Freq: Once | ORAL | Status: AC
  Administered 2022-06-21: 360 mg via ORAL
  Filled 2022-06-21: qty 3

## 2022-06-21 NOTE — ED Provider Notes (Signed)
Received patient in turnover from Dr. Johnney Killian.  Please see their note for further details of Hx, PE.  Briefly patient is a 88 y.o. female with a No chief complaint on file. .  Patient with viral-like illness diarrhea cough congestion going on for about 4 or 5 days now.  She is mildly tachycardic in A-fib.  Lab work is resulted without significant renal dysfunction, no significant anemia.  COVID flu RSV negative.  Chest x-ray independently interpreted by me without focal infiltrate.  Patient had a BNP that was in the indiscriminate range.  Her symptoms are not consistent with heart failure.  Clinically she seems to be more fluid down.  Will give her another 500 cc bolus here.  Dose of Tylenol.  Likely treat supportively with PCP follow-up.    Deno Etienne, DO 06/21/22 [place]

## 2022-06-21 NOTE — ED Notes (Signed)
Pt verbalized understanding of d/c instructions, meds, and followup care. Denies questions. VSS, no distress noted. Steady gait to exit with all belongings. [facility] given report- pt would like help with inhaler. Family transports home.

## 2022-06-21 NOTE — ED Notes (Signed)
Family worried about dehydration as pt has not been eating and drinking much and when she does it results in diarrhea

## 2022-06-21 NOTE — Discharge Instructions (Signed)
1.  Try to stay well-hydrated. 2.  Continue to take your medications for atrial fibrillation. 3.  You will need close monitoring for hydration while you are having diarrhea.  Call your doctor or see your care provider within the next 2 days. 4.  You have been given a dose of Zithromax in the emergency department.  This is an antibiotic for your cough.  Start taking it tomorrow and take 1 daily for the next 4 days.  You have been given albuterol to help with coughing.  You may take 2 puffs every 4-6 hours as needed. 5.  Return to emergency department if you have any new worsening or concerning symptoms.

## 2022-06-21 NOTE — ED Triage Notes (Signed)
Body aches, runny  nose, diarrhea, fatigue, since Wednesday. Covid negative at home

## 2022-06-21 NOTE — ED Provider Notes (Signed)
[facility] Provider Note   CSN: [id number] Arrival date & time: 06/21/22  1223     History  No chief complaint on file.   Adrienne [name] is a 88 y.o. female.  HPI Patient has had several days of cough and congestion.  She has had nasal congestion and the frequent cough.  She started getting diarrhea as well in the past 2 days.  Now she has had frequent diarrhea and is getting fatigued.  Has had decreased oral intake.  They took a negative home COVID test.  Patient reports because she was sick she has not taken her Cardizem and atenolol for yesterday or today.  She has chronic A-fib.  She reports she has continued to take her Eliquis.    Home Medications Prior to Admission medications   Medication Sig Start Date End Date Taking? Authorizing Provider  azithromycin (ZITHROMAX) 250 MG tablet First dose given in the emergency department.  Start a daily tablet 2\19\2024 06/21/22  Yes Damontre Millea, Jeannie Done, MD  Accu-Chek Softclix Lancets lancets Use to test up to 4 times daily. 10/28/21   Nafziger, Tommi Rumps, NP  allopurinol (ZYLOPRIM) 300 MG tablet Take 1 tablet (300 mg total) by mouth daily. 06/26/21   Nafziger, Tommi Rumps, NP  apixaban (ELIQUIS) 2.5 MG TABS tablet TAKE 1 TABLET BY MOUTH TWICE A DAY 04/27/22   Lelon Perla, MD  atenolol (TENORMIN) 25 MG tablet TAKE 1 TABLET BY MOUTH EVERY DAY 04/20/22   Lelon Perla, MD  blood glucose meter kit and supplies KIT Dispense based on patient and insurance preference. Use up to four times daily as directed. 09/23/21   Nafziger, Tommi Rumps, NP  chlorpheniramine (CHLOR-TRIMETON) 4 MG tablet Take 4 mg 2 (two) times daily as needed by mouth for allergies.    [provider]  diltiazem (CARDIZEM CD) 360 MG 24 hr capsule TAKE 1 CAPSULE BY MOUTH EVERY DAY 04/20/22   Lelon Perla, MD  fish oil-omega-3 fatty acids 1000 MG capsule Take 1 g by mouth daily.    [provider]  glucose blood (ACCU-CHEK  GUIDE) test strip TEST UP TO FOUR TIMES A DAY 10/17/21   Nafziger, Tommi Rumps, NP  metFORMIN (GLUCOPHAGE) 500 MG tablet Take 1 tablet (500 mg total) by mouth 2 (two) times daily with a meal. 09/23/21   Nafziger, Tommi Rumps, NP  Multiple Vitamin (MULTIVITAMIN) tablet Take 1 tablet by mouth daily.    [provider]  OVER THE COUNTER MEDICATION Take by mouth 2 (two) times daily. Presavision- for macular degeneration prevention    [provider]  phenazopyridine (PYRIDIUM) 95 MG tablet Take 95 mg by mouth 3 (three) times daily as needed for pain. Patient not taking: Reported on 04/23/2022    [provider]  rosuvastatin (CRESTOR) 5 MG tablet Take 1 tablet (5 mg total) by mouth daily. 06/26/21   Nafziger, Tommi Rumps, NP  VITAMIN D, ERGOCALCIFEROL, PO Take by mouth.    [provider]      Allergies    Penicillins, Other, Prednisone, Radiaplexrx [wound dressings], and Sulfamethoxazole    Review of Systems   Review of Systems  Physical Exam Updated Vital Signs BP (!) 169/98   Pulse 61   Temp 98.3 F (36.8 C) (Oral)   Resp 20   SpO2 96%  Physical Exam Constitutional:      Comments: Nontoxic and alert.  No respiratory distress.  Well-nourished well-developed.  HENT:     Head: Normocephalic and atraumatic.  Mouth/Throat:     Pharynx: Oropharynx is clear.  Eyes:     Extraocular Movements: Extraocular movements intact.  Cardiovascular:     Comments: Tachycardia irregularly irregular. Pulmonary:     Comments: Frequent cough.  No respiratory distress.  Lungs are clear. Abdominal:     General: There is no distension.     Palpations: Abdomen is soft.     Tenderness: There is no abdominal tenderness. There is no guarding.  Musculoskeletal:        General: No swelling or tenderness. Normal range of motion.     Right lower leg: No edema.     Left lower leg: No edema.  Skin:    General: Skin is warm and dry.  Neurological:     General: No focal deficit present.      Mental Status: She is oriented to person, place, and time.     Motor: No weakness.     Coordination: Coordination normal.  Psychiatric:        Mood and Affect: Mood normal.     ED Results / Procedures / Treatments   Labs (all labs ordered are listed, but only abnormal results are displayed) Labs Reviewed  COMPREHENSIVE METABOLIC PANEL - Abnormal; Notable for the following components:      Result Value   Glucose, Bld 106 (*)    BUN 24 (*)    Creatinine, Ser 1.08 (*)    AST 62 (*)    ALT 47 (*)    GFR, Estimated 50 (*)    All other components within normal limits  BRAIN NATRIURETIC PEPTIDE - Abnormal; Notable for the following components:   B Natriuretic Peptide 251.5 (*)    All other components within normal limits  URINALYSIS, ROUTINE W REFLEX MICROSCOPIC - Abnormal; Notable for the following components:   Ketones, ur 15 (*)    Protein, ur 30 (*)    Leukocytes,Ua SMALL (*)    All other components within normal limits  RESP PANEL BY RT-PCR (RSV, FLU A&B, COVID)  RVPGX2  LIPASE, BLOOD  CBC WITH DIFFERENTIAL/PLATELET  TROPONIN I (HIGH SENSITIVITY)    EKG EKG Interpretation  Date/Time:  Sunday June 21 2022 14:27:18 EST Ventricular Rate:  120 PR Interval:    QRS Duration: 87 QT Interval:  318 QTC Calculation: 425 R Axis:   18 Text Interpretation: Atrial fibrillation Ventricular premature complex LVH with secondary repolarization abnormality Anterior Q waves, possibly due to LVH Otherwise no significant change Confirmed by Deno Etienne [phone]) on 06/21/2022 3:03:39 PM  Radiology DG Chest Port 1 View  Result Date: 06/21/2022 CLINICAL DATA:  Cough EXAM: PORTABLE CHEST 1 VIEW COMPARISON:  12/28/2017 FINDINGS: Cardiac silhouette appears prominent. No pneumonia or pulmonary edema. No pneumothorax or pleural effusion. Aorta is calcified. IMPRESSION: Enlarged cardiac silhouette. Electronically Signed   By: Sammie Bench M.D.   On: 06/21/2022 14:34    Procedures Procedures     Medications Ordered in ED Medications  lactated ringers bolus 500 mL ( Intravenous Restarted 06/21/22 1558)  acetaminophen (TYLENOL) tablet 1,000 mg (has no administration in time range)  albuterol (VENTOLIN HFA) 108 (90 Base) MCG/ACT inhaler 2 puff (has no administration in time range)  lactated ringers bolus 500 mL (500 mLs Intravenous New Bag/Given 06/21/22 1446)  atenolol (TENORMIN) tablet 25 mg (25 mg Oral Given 06/21/22 1621)  diltiazem (CARDIZEM CD) 24 hr capsule 360 mg (360 mg Oral Given 06/21/22 1623)  azithromycin (ZITHROMAX) tablet 500 mg (500 mg Oral Given 06/21/22 1625)  ED Course/ Medical Decision Making/ A&P                             Medical Decision Making Amount and/or Complexity of Data Reviewed Labs: ordered. Radiology: ordered.  Risk Prescription drug management.   Patient has had several symptoms developing over the past week.  She has had a cough that is been fairly persistent but not had a fever with it.  No chest pain or shortness of breath.  She reports she feels like she has sinus congestion and drainage as well.  She has developed diarrhea over the past 2 days with frequent stool.  She has had decreased oral intake and as of today is getting generally very weak.  Baseline patient is active doing yoga and taking long walks.  She reports she just feels extremely fatigued today.  Will proceed with broad general evaluation.  Will initiate resuscitation with LR 500 cc for volume loss from dehydration.  Metabolic panel within normal limits.  Patient's BUN and creatinine are 24\1.08, this has been consistent with prior values.  Troponin normal at 11.  Urinalysis, patient has had no symptoms he has 6-10 white cells and no bacteria.  At this time I have low suspicion for UTI given patient's existing symptoms and UA results.  Chest x-ray is clear without volume overload or focal consolidations.  Patient does have frequent cough and this has been a presenting  symptom.  Will opt to treat with Z-Pak for bronchitis\atypical pneumonia.  Hydration patient appears stable for discharge.  She is alert and she is appropriate.  She does have tachycardia but has chronic A-fib and has had 2 days without her rate control medications.  Will administer her Cardizem and Toprol in the emergency department.  We discussed continuing to manage the A-fib as per usual at home with close monitoring.  Careful return precautions reviewed.        Final Clinical Impression(s) / ED Diagnoses Final diagnoses:  Bronchitis  Diarrhea, unspecified type  General weakness    Rx / DC Orders ED Discharge Orders          Ordered    azithromycin (ZITHROMAX) 250 MG tablet        06/21/22 1623              Charlesetta Shanks, MD 06/21/22 1629

## 2022-06-23 NOTE — Telephone Encounter (Signed)
Look like pt went to ED. Tried to call pt to f/u no answer. Sending to provider as Juluis Rainier.

## 2022-06-25 ENCOUNTER — Telehealth: Payer: Self-pay

## 2022-06-25 NOTE — Telephone Encounter (Signed)
        Patient  visited [facility] on 2/18    Telephone encounter attempt :  1st  A HIPAA compliant voice message was left requesting a return call.  Instructed patient to call back    [facility] [phone] [address], [place], [place] [address] Phone: [phone] Email: [email or web address]

## 2022-06-26 ENCOUNTER — Ambulatory Visit (INDEPENDENT_AMBULATORY_CARE_PROVIDER_SITE_OTHER): Payer: Medicare PPO | Admitting: Adult Health

## 2022-06-26 ENCOUNTER — Other Ambulatory Visit: Payer: Self-pay

## 2022-06-26 ENCOUNTER — Telehealth: Payer: Self-pay

## 2022-06-26 VITALS — BP 150/80 | HR 67 | Temp 97.9°F | Ht 61.0 in | Wt 120.0 lb

## 2022-06-26 DIAGNOSIS — E559 Vitamin D deficiency, unspecified: Secondary | ICD-10-CM | POA: Diagnosis not present

## 2022-06-26 DIAGNOSIS — M1A9XX0 Chronic gout, unspecified, without tophus (tophi): Secondary | ICD-10-CM

## 2022-06-26 DIAGNOSIS — Z853 Personal history of malignant neoplasm of breast: Secondary | ICD-10-CM | POA: Diagnosis not present

## 2022-06-26 DIAGNOSIS — E7800 Pure hypercholesterolemia, unspecified: Secondary | ICD-10-CM | POA: Diagnosis not present

## 2022-06-26 DIAGNOSIS — Z0000 Encounter for general adult medical examination without abnormal findings: Secondary | ICD-10-CM | POA: Diagnosis not present

## 2022-06-26 DIAGNOSIS — E139 Other specified diabetes mellitus without complications: Secondary | ICD-10-CM

## 2022-06-26 DIAGNOSIS — I10 Essential (primary) hypertension: Secondary | ICD-10-CM

## 2022-06-26 DIAGNOSIS — I4821 Permanent atrial fibrillation: Secondary | ICD-10-CM

## 2022-06-26 LAB — COMPREHENSIVE METABOLIC PANEL
ALT: 30 U/L (ref 0–35)
AST: 26 U/L (ref 0–37)
Albumin: 3.8 g/dL (ref 3.5–5.2)
Alkaline Phosphatase: 92 U/L (ref 39–117)
BUN: 19 mg/dL (ref 6–23)
CO2: 28 mEq/L (ref 19–32)
Calcium: 10.1 mg/dL (ref 8.4–10.5)
Chloride: 103 mEq/L (ref 96–112)
Creatinine, Ser: 1.03 mg/dL (ref 0.40–1.20)
GFR: 49.61 mL/min — ABNORMAL LOW (ref >60.00)
Glucose, Bld: 169 mg/dL — ABNORMAL HIGH (ref 70–99)
Potassium: 4.1 mEq/L (ref 3.5–5.1)
Sodium: 142 mEq/L (ref 135–145)
Total Bilirubin: 0.5 mg/dL (ref 0.2–1.2)
Total Protein: 7.3 g/dL (ref 6.0–8.3)

## 2022-06-26 LAB — LIPID PANEL
Cholesterol: 85 mg/dL (ref 0–200)
HDL: 46.9 mg/dL (ref >39.00)
LDL Cholesterol: 20 mg/dL (ref 0–99)
NonHDL: 37.63
Total CHOL/HDL Ratio: 2
Triglycerides: 87 mg/dL (ref 0.0–149.0)
VLDL: 17.4 mg/dL (ref 0.0–40.0)

## 2022-06-26 LAB — VITAMIN D 25 HYDROXY (VIT D DEFICIENCY, FRACTURES): VITD: 59.14 ng/mL (ref 30.00–100.00)

## 2022-06-26 LAB — MICROALBUMIN / CREATININE URINE RATIO
Creatinine,U: 72.8 mg/dL
Microalb Creat Ratio: 24.6 mg/g (ref 0.0–30.0)
Microalb, Ur: 17.9 mg/dL — ABNORMAL HIGH (ref 0.0–1.9)

## 2022-06-26 LAB — HEMOGLOBIN A1C: Hgb A1c MFr Bld: 8.2 % — ABNORMAL HIGH (ref 4.6–6.5)

## 2022-06-26 LAB — TSH: TSH: 1.84 u[IU]/mL (ref 0.35–5.50)

## 2022-06-26 MED ORDER — GLIPIZIDE ER 2.5 MG PO TB24: 2.5000 mg | ORAL_TABLET | Freq: Every day | ORAL | 1 refills | Status: DC

## 2022-06-26 NOTE — Telephone Encounter (Signed)
     Patient  visit on 2/18  at [facility]    Have you been able to follow up with your primary care physician? Yes   The patient was or was not able to obtain any needed medicine or equipment. Yes   Are there diet recommendations that you are having difficulty following? Na   Patient expresses understanding of discharge instructions and education provided has no other needs at this time.  Yes     [facility] [phone] [address], [place], [place] [address] Phone: [phone] Email: [email or web address]

## 2022-06-26 NOTE — Patient Instructions (Signed)
It was great seeing you today   We will follow up with you regarding your lab work   Please let me know if you need anything   

## 2022-06-26 NOTE — Progress Notes (Signed)
Subjective:    Patient ID: Adrienne [name], female    DOB: November 02, 1934, 88 y.o.   MRN: [id number]  HPI Patient presents for yearly preventative medicine examination.She is a pleasant 88 year old female who  has a past medical history of Allergy, Atrial fibrillation ([facility]), Breast cancer ([facility]) (08/25/12), Diabetes mellitus, Family history of cancer, Gout, Hearing loss, History of breast cancer, History of frequent urinary tract infections, History of radiation therapy (03/21/18- 04/19/18), radiation therapy (11/22/12- 12/19/12), Hyperlipidemia, Hypertension, and Osteopenia.  Diabetes mellitus type II-managed with metformin 500 mg twice daily.  She does not check her blood sugars at home on a routine basis.  She denies episodes of hypoglycemia.  She does try and stay active with yoga and cardio classes multiple times a week and then does weight training 7 days a week. Her diet has been higher in carbs and sugars.  Lab Results  Component Value Date   HGBA1C 7.6 (A) 04/01/2022   HTN -managed with Cardizem 360 mg daily and atenolol 5 mg daily.  She denies dizziness, lightheadedness, chest pain, or shortness of breath. She has not taken her blood pressure medication this morning.  BP Readings from Last 3 Encounters:  06/26/22 (!) 150/80  06/21/22 (!) 169/98  04/23/22 (!) 159/89   Atrial Fibrillation -she remains on Xarelto 2.5 mg twice daily and Cardizem  Hyperlipidemia - Managed with Crestor 5 mg daily. She denies myalgia or fatigue  Lab Results  Component Value Date   CHOL 143 06/25/2021   HDL 79.80 06/25/2021   LDLCALC 37 06/25/2021   LDLDIRECT 124.0 07/22/2006   TRIG 131.0 06/25/2021   CHOLHDL 2 06/25/2021   Gout - takes allopurinol 300 mg daily - no recent gour flares.   H/o breast cancer - is seen by Oncology every 6 months. Has completed treatment.   Vitamin D Deficiency - takes 1000 units daily.   She was recently seen in the ER for viral illness dealing with diarrhea. She is  feeling much better. No longer having any symptoms.   All immunizations and health maintenance protocols were reviewed with the patient and needed orders were placed.  Appropriate screening laboratory values were ordered for the patient including screening of hyperlipidemia, renal function and hepatic function.  Medication reconciliation,  past medical history, social history, problem list and allergies were reviewed in detail with the patient  Goals were established with regard to weight loss, exercise, and  diet in compliance with medications  Wt Readings from Last 3 Encounters:  06/26/22 120 lb (54.4 kg)  04/23/22 124 lb 1.6 oz (56.3 kg)  04/08/22 123 lb (55.8 kg)    Review of Systems  Constitutional: Negative.   HENT: Negative.    Eyes: Negative.   Respiratory: Negative.    Cardiovascular: Negative.   Gastrointestinal: Negative.   Endocrine: Negative.   Genitourinary: Negative.   Musculoskeletal:  Positive for arthralgias and gait problem.  Skin: Negative.   Allergic/Immunologic: Negative.   Hematological: Negative.   Psychiatric/Behavioral: Negative.     Past Medical History:  Diagnosis Date   Allergy    Atrial fibrillation ([facility])    Breast cancer ([facility]) 08/25/12   Invasive ductal ca,DCIS   Diabetes mellitus    type II   Family history of cancer    Gout    Hearing loss    History of breast cancer    History of frequent urinary tract infections    History of radiation therapy 03/21/18- 04/19/18   Right Breast 2.67  Gy X 15 fractions, right breast boost 2 Gy X 5 fractions.    Hx of radiation therapy 11/22/12- 12/19/12   breast 4250 cGy 17 sessions, left breast boost 750 cGy 3 sessions   Hyperlipidemia    Hypertension    Osteopenia     Social History   Socioeconomic History   Marital status: Married    Spouse name: Not on file   Number of children: 3   Years of education: Not on file   Highest education level: Not on file  Occupational History   Not on file   Tobacco Use   Smoking status: Former    Packs/day: 0.25    Years: 4.00    Total pack years: 1.00    Types: Cigarettes   Smokeless tobacco: Never   Tobacco comments:    Cigarette use was 50 years ago  Vaping Use   Vaping Use: Never used  Substance and Sexual Activity   Alcohol use: Not Currently   Drug use: No   Sexual activity: Not Currently    Comment: menarche age 65, 85st birth age 29, G32, HRT x 7 years?  Other Topics Concern   Not on file  Social History Narrative   Married - husband in memory care unit    Three children ( One lives at [facility] and two in [place]      She likes to read and go to [employer]    Social Determinants of Health   Financial Resource Strain: Low Risk  (03/31/2022)   Overall Financial Resource Strain (CARDIA)    Difficulty of Paying Living Expenses: Not hard at all  Food Insecurity: No Food Insecurity (03/31/2022)   Hunger Vital Sign    Worried About Running Out of Food in the Last Year: Never true    Ran Out of Food in the Last Year: Never true  Transportation Needs: No Transportation Needs (03/31/2022)   PRAPARE - Hydrologist (Medical): No    Lack of Transportation (Non-Medical): No  Physical Activity: Sufficiently Active (03/31/2022)   Exercise Vital Sign    Days of Exercise per Week: 7 days    Minutes of Exercise per Session: 90 min  Stress: No Stress Concern Present (03/31/2022)   [place]    Feeling of Stress : Not at all  Social Connections: Moderately Integrated (03/31/2022)   Social Connection and Isolation Panel [NHANES]    Frequency of Communication with Friends and Family: More than three times a week    Frequency of Social Gatherings with Friends and Family: More than three times a week    Attends Religious Services: More than 4 times per year    Active Member of [employer] or Organizations: Yes    Attends Archivist  Meetings: More than 4 times per year    Marital Status: Widowed  Intimate Partner Violence: Not At Risk (03/31/2022)   Humiliation, Afraid, Rape, and Kick questionnaire    Fear of Current or Ex-Partner: No    Emotionally Abused: No    Physically Abused: No    Sexually Abused: No    Past Surgical History:  Procedure Laterality Date   ABDOMINAL HYSTERECTOMY Bilateral 1999   w/b/l salpingo-oopherectomy   APPENDECTOMY     BREAST LUMPECTOMY WITH NEEDLE LOCALIZATION AND AXILLARY SENTINEL LYMPH NODE BX Left 09/12/2012   Procedure: LEFT NEEDLE LOCALIZATION BREAST LUMPECTOMY TION AND AXILLARY SENTINEL LYMPH NODE BX;  Surgeon: Edward Jolly, MD;  Location: [facility] OR;  Service: General;  Laterality: Left;   BREAST LUMPECTOMY WITH RADIOACTIVE SEED AND SENTINEL LYMPH NODE BIOPSY Right 10/26/2017   Procedure: BREAST LUMPECTOMY WITH RADIOACTIVE SEED AND SENTINEL LYMPH NODE BIOPSY;  Surgeon: Excell Seltzer, MD;  Location: [facility];  Service: General;  Laterality: Right;   BREAST SURGERY  1990's   right- fibroid cyst   CHOLECYSTECTOMY     SEPTOPLASTY     TONSILLECTOMY      Family History  Problem Relation Age of Onset   Hypertension Mother    Heart disease Mother        Murmur and irregular heart disease   Dementia Mother        d. 55   Hypertension Father    Aneurysm Father 25   Breast cancer Cousin 16       mat first cousin    Allergies  Allergen Reactions   Penicillins Hives    Has patient had a PCN reaction causing immediate rash, facial/tongue/throat swelling, SOB or lightheadedness with hypotension: No Has patient had a PCN reaction causing severe rash involving mucus membranes or skin necrosis: No Has patient had a PCN reaction that required hospitalization: No Has patient had a PCN reaction occurring within the last 10 years: No If all of the above answers are "NO", then may proceed with Cephalosporin use.   Other Hives   Prednisone Other (See Comments)     ELEVATED BLOOD SUGAR   Radiaplexrx [Wound Dressings] Hives   Sulfamethoxazole Nausea Only    Current Outpatient Medications on File Prior to Visit  Medication Sig Dispense Refill   Accu-Chek Softclix Lancets lancets Use to test up to 4 times daily. 100 each 12   allopurinol (ZYLOPRIM) 300 MG tablet Take 1 tablet (300 mg total) by mouth daily. 90 tablet 3   apixaban (ELIQUIS) 2.5 MG TABS tablet TAKE 1 TABLET BY MOUTH TWICE A DAY 60 tablet 0   atenolol (TENORMIN) 25 MG tablet TAKE 1 TABLET BY MOUTH EVERY DAY 90 tablet 3   azithromycin (ZITHROMAX) 250 MG tablet First dose given in the emergency department.  Start a daily tablet 2\19\2024 4 each 0   blood glucose meter kit and supplies KIT Dispense based on patient and insurance preference. Use up to four times daily as directed. 1 each 0   chlorpheniramine (CHLOR-TRIMETON) 4 MG tablet Take 4 mg 2 (two) times daily as needed by mouth for allergies.     diltiazem (CARDIZEM CD) 360 MG 24 hr capsule TAKE 1 CAPSULE BY MOUTH EVERY DAY 90 capsule 3   fish oil-omega-3 fatty acids 1000 MG capsule Take 1 g by mouth daily.     glucose blood (ACCU-CHEK GUIDE) test strip TEST UP TO FOUR TIMES A DAY 100 strip 3   metFORMIN (GLUCOPHAGE) 500 MG tablet Take 1 tablet (500 mg total) by mouth 2 (two) times daily with a meal. 180 tablet 3   Multiple Vitamin (MULTIVITAMIN) tablet Take 1 tablet by mouth daily.     OVER THE COUNTER MEDICATION Take by mouth 2 (two) times daily. Presavision- for macular degeneration prevention     phenazopyridine (PYRIDIUM) 95 MG tablet Take 95 mg by mouth 3 (three) times daily as needed for pain.     rosuvastatin (CRESTOR) 5 MG tablet Take 1 tablet (5 mg total) by mouth daily. 90 tablet 3   VITAMIN D, ERGOCALCIFEROL, PO Take by mouth.     No current facility-administered medications on file prior to visit.  BP (!) 150/80   Pulse 67   Temp 97.9 F (36.6 C) (Oral)   Ht '5\' 1"'$  (1.549 m)   Wt 120 lb (54.4 kg)   SpO2 96%   BMI  22.67 kg/m       Objective:   Physical Exam Vitals and nursing note reviewed.  Constitutional:      General: She is not in acute distress.    Appearance: Normal appearance. She is well-developed. She is not ill-appearing.  HENT:     Head: Normocephalic and atraumatic.     Right Ear: Tympanic membrane, ear canal and external ear normal. There is no impacted cerumen.     Left Ear: Tympanic membrane, ear canal and external ear normal. There is no impacted cerumen.     Nose: Nose normal. No congestion or rhinorrhea.     Mouth/Throat:     Mouth: Mucous membranes are moist.     Pharynx: Oropharynx is clear. No oropharyngeal exudate or posterior oropharyngeal erythema.  Eyes:     General:        Right eye: No discharge.        Left eye: No discharge.     Extraocular Movements: Extraocular movements intact.     Conjunctiva/sclera: Conjunctivae normal.     Pupils: Pupils are equal, round, and reactive to light.  Neck:     Thyroid: No thyromegaly.     Vascular: No carotid bruit.     Trachea: No tracheal deviation.  Cardiovascular:     Rate and Rhythm: Normal rate and regular rhythm.     Pulses: Normal pulses.     Heart sounds: Normal heart sounds. No murmur heard.    No friction rub. No gallop.  Pulmonary:     Effort: Pulmonary effort is normal. No respiratory distress.     Breath sounds: Normal breath sounds. No stridor. No wheezing, rhonchi or rales.  Chest:     Chest wall: No tenderness.  Abdominal:     General: Abdomen is flat. Bowel sounds are normal. There is no distension.     Palpations: Abdomen is soft. There is no mass.     Tenderness: There is no abdominal tenderness. There is no right CVA tenderness, left CVA tenderness, guarding or rebound.     Hernia: No hernia is present.  Musculoskeletal:        General: No swelling, tenderness, deformity or signs of injury. Normal range of motion.     Cervical back: Normal range of motion and neck supple.     Right lower leg: No  edema.     Left lower leg: No edema.  Lymphadenopathy:     Cervical: No cervical adenopathy.  Skin:    General: Skin is warm and dry.     Coloration: Skin is not jaundiced or pale.     Findings: No bruising, erythema, lesion or rash.  Neurological:     General: No focal deficit present.     Mental Status: She is alert and oriented to person, place, and time.     Cranial Nerves: No cranial nerve deficit.     Sensory: No sensory deficit.     Motor: Weakness present.     Coordination: Coordination normal.     Gait: Gait abnormal.  Psychiatric:        Mood and Affect: Mood normal.        Behavior: Behavior normal.        Thought Content: Thought content normal.  Judgment: Judgment normal.       Assessment & Plan:   1. Routine general medical examination at a health care facility Today patient counseled on age appropriate routine health concerns for screening and prevention, each reviewed and up to date or declined. Immunizations reviewed and up to date or declined. Labs ordered and reviewed. Risk factors for depression reviewed and negative. Hearing function and visual acuity are intact. ADLs screened and addressed as needed. Functional ability and level of safety reviewed and appropriate. Education, counseling and referrals performed based on assessed risks today. Patient provided with a copy of personalized plan for preventive services. - Follow up in one year for CPE   2. Diabetes 1.5, managed as type 2 ([facility]) - Consider increase in metformin  - Follow up in 3 months  - Lipid panel; Future - Hemoglobin A1c; Future - Microalbumin/Creatinine Ratio, Urine; Future - Comprehensive metabolic panel; Future - TSH; Future  3. Essential hypertension - elevated today but she did not take her medication prior to visit.  - Lipid panel; Future - Hemoglobin A1c; Future - Microalbumin/Creatinine Ratio, Urine; Future - Comprehensive metabolic panel; Future - TSH; Future  4. Chronic  gout without tophus, unspecified cause, unspecified site - Continue with allopurinol  - Lipid panel; Future - Hemoglobin A1c; Future - Microalbumin/Creatinine Ratio, Urine; Future - Comprehensive metabolic panel; Future - TSH; Future  5. Vitamin D deficiency - Consider increase in Vitamin D  - VITAMIN D 25 Hydroxy (Vit-D Deficiency, Fractures); Future  6. Permanent atrial fibrillation ([facility]) - Continue Eliquis and BB - Lipid panel; Future - Hemoglobin A1c; Future - Microalbumin/Creatinine Ratio, Urine; Future - Comprehensive metabolic panel; Future - TSH; Future  7. Pure hypercholesterolemia - Consider increase in statin  - Lipid panel; Future - Hemoglobin A1c; Future - Microalbumin/Creatinine Ratio, Urine; Future - Comprehensive metabolic panel; Future - TSH; Future  8. History of breast cancer - Per oncology  - Lipid panel; Future - Hemoglobin A1c; Future - Microalbumin/Creatinine Ratio, Urine; Future - Comprehensive metabolic panel; Future - TSH; Future   [name], NP

## 2022-07-14 ENCOUNTER — Telehealth: Payer: Self-pay

## 2022-07-14 NOTE — Patient Outreach (Signed)
  Care Coordination   Initial Visit Note   07/14/2022 Name: Adrienne [name] MRN: [id number] DOB: 01/03/1935  Adrienne [name] is a 88 y.o. year old female who sees Nafziger, Tommi Rumps, NP for primary care. I spoke with  Adrienne [name] by phone today.  What matters to the patients health and wellness today?  Patient unable to complete today's call, scheduled call on 3/13   SDOH assessments and interventions completed:  No     Care Coordination Interventions:  No, not indicated   Follow up plan: Follow up call scheduled for 3/13    Encounter Outcome:  Pt. Scheduled   Daneen Schick, BSW, CDP Social Worker, Certified Dementia Practitioner [facility]  Care Coordination [phone]

## 2022-07-15 ENCOUNTER — Ambulatory Visit: Payer: Self-pay

## 2022-07-15 NOTE — Patient Outreach (Signed)
  Care Coordination   Follow Up Visit Note   07/15/2022 Name: Adrienne [name] MRN: [id number] DOB: 1935/04/19  Adrienne [name] is a 88 y.o. year old female who sees Nafziger, Tommi Rumps, NP for primary care. I spoke with  Adrienne [name] by phone today. I provided education to the patient about the role of the care coordination team. At this time the patient declines long-term follow up. Patient lives at [facility] and is active in multiple programs. She has a supportive family that is engaged in her care and visits regularly.   What matters to the patients health and wellness today?  No concerns at this time, patient is doing well.   SDOH assessments and interventions completed:  Yes  SDOH Interventions Today    Flowsheet Row Most Recent Value  SDOH Interventions   Food Insecurity Interventions Intervention Not Indicated  Housing Interventions Intervention Not Indicated  Transportation Interventions Intervention Not Indicated  Utilities Interventions Intervention Not Indicated  Financial Strain Interventions Intervention Not Indicated        Care Coordination Interventions:  Yes, provided   Interventions Today    Flowsheet Row Most Recent Value  Chronic Disease   Chronic disease during today's visit Diabetes, Hypertension (HTN), Atrial Fibrillation (AFib)  General Interventions   General Interventions Discussed/Reviewed General Interventions Discussed, Doctor Visits  Doctor Visits Discussed/Reviewed Doctor Visits Reviewed        Follow up plan: No further intervention required. The patient has been encouraged to contact the care coordination team as needed in the future.    Encounter Outcome:  Pt. Visit Completed   Daneen Schick, BSW, CDP Social Worker, Certified Dementia Practitioner [facility]  Care Coordination [phone]

## 2022-07-15 NOTE — Patient Instructions (Signed)
Visit Information  Thank you for taking time to visit with me today. Please don't hesitate to contact me if I can be of assistance to you.   Following are the goals we discussed today:  -Contact primary care provider as needed -Remain engaged with programming at [facility]    If you are experiencing a Mental Health or [facility] or need someone to talk to, please call 911  The patient verbalized understanding of instructions, educational materials, and care plan provided today and DECLINED offer to receive copy of patient instructions, educational materials, and care plan.   No further follow up required: Please contact your primary care provider as needed  Daneen Schick, Arita Miss, CDP Social Worker, Certified Dementia Practitioner [facility] [phone]

## 2022-07-16 ENCOUNTER — Other Ambulatory Visit: Payer: Self-pay | Admitting: Adult Health

## 2022-07-24 ENCOUNTER — Other Ambulatory Visit: Payer: Self-pay | Admitting: Cardiology

## 2022-07-24 DIAGNOSIS — I4821 Permanent atrial fibrillation: Secondary | ICD-10-CM

## 2022-07-24 NOTE — Telephone Encounter (Signed)
Prescription refill request for Eliquis received. Indication: a fib Last office visit: 02/03/21 (overdue) Scr: 1.03 wpic Age: 88 Weight: 54kg  Message sent to scheduling

## 2022-08-10 ENCOUNTER — Other Ambulatory Visit: Payer: Self-pay | Admitting: Adult Health

## 2022-08-10 DIAGNOSIS — M1A9XX0 Chronic gout, unspecified, without tophus (tophi): Secondary | ICD-10-CM

## 2022-09-01 ENCOUNTER — Ambulatory Visit: Payer: Medicare PPO | Admitting: Adult Health

## 2022-09-01 ENCOUNTER — Encounter: Payer: Self-pay | Admitting: Adult Health

## 2022-09-01 VITALS — BP 132/80 | HR 90 | Temp 98.0°F | Wt 121.0 lb

## 2022-09-01 DIAGNOSIS — R2681 Unsteadiness on feet: Secondary | ICD-10-CM | POA: Diagnosis not present

## 2022-09-01 DIAGNOSIS — M5432 Sciatica, left side: Secondary | ICD-10-CM | POA: Diagnosis not present

## 2022-09-01 NOTE — Progress Notes (Signed)
Subjective:    Patient ID: Adrienne [name], female    DOB: 12-21-1934, 88 y.o.   MRN: [id number]  HPI 88 year old female who  has a past medical history of Allergy, Atrial fibrillation (HCC), Breast cancer (HCC) (08/25/12), Diabetes mellitus, Family history of cancer, Gout, Hearing loss, History of breast cancer, History of frequent urinary tract infections, History of radiation therapy (03/21/18- 04/19/18), radiation therapy (11/22/12- 12/19/12), Hyperlipidemia, Hypertension, and Osteopenia.  She currently resides at [facility] and she would like to be referred to PT at [facility] She has gone to PT elsewhere and feels as though PT at [facility] is the best there is out there to help with her gait and sciatica of left side.   Review of Systems See HPI   Past Medical History:  Diagnosis Date   Allergy    Atrial fibrillation (HCC)    Breast cancer (HCC) 08/25/12   Invasive ductal ca,DCIS   Diabetes mellitus    type II   Family history of cancer    Gout    Hearing loss    History of breast cancer    History of frequent urinary tract infections    History of radiation therapy 03/21/18- 04/19/18   Right Breast 2.67 Gy X 15 fractions, right breast boost 2 Gy X 5 fractions.    Hx of radiation therapy 11/22/12- 12/19/12   breast 4250 cGy 17 sessions, left breast boost 750 cGy 3 sessions   Hyperlipidemia    Hypertension    Osteopenia     Social History   Socioeconomic History   Marital status: Married    Spouse name: Not on file   Number of children: 3   Years of education: Not on file   Highest education level: Not on file  Occupational History   Not on file  Tobacco Use   Smoking status: Former    Packs/day: 0.25    Years: 4.00    Additional pack years: 0.00    Total pack years: 1.00    Types: Cigarettes   Smokeless tobacco: Never   Tobacco comments:    Cigarette use was 50 years ago  Vaping Use   Vaping Use: Never used  Substance and Sexual Activity   Alcohol  use: Not Currently   Drug use: No   Sexual activity: Not Currently    Comment: menarche age 40, 1st birth age 64, G20, HRT x 7 years?  Other Topics Concern   Not on file  Social History Narrative   Married - husband in memory care unit    Three children ( One lives at [place] and two in [place]      She likes to read and go to [employer]    Social Determinants of Health   Financial Resource Strain: Low Risk  (07/15/2022)   Overall Financial Resource Strain (CARDIA)    Difficulty of Paying Living Expenses: Not hard at all  Food Insecurity: No Food Insecurity (07/15/2022)   Hunger Vital Sign    Worried About Running Out of Food in the Last Year: Never true    Ran Out of Food in the Last Year: Never true  Transportation Needs: No Transportation Needs (07/15/2022)   PRAPARE - Administrator, Civil Service (Medical): No    Lack of Transportation (Non-Medical): No  Physical Activity: Sufficiently Active (03/31/2022)   Exercise Vital Sign    Days of Exercise per Week: 7 days    Minutes of Exercise per Session:  90 min  Stress: No Stress Concern Present (03/31/2022)   [employer] of Occupational Health - Occupational Stress Questionnaire    Feeling of Stress : Not at all  Social Connections: Moderately Integrated (03/31/2022)   Social Connection and Isolation Panel [NHANES]    Frequency of Communication with Friends and Family: More than three times a week    Frequency of Social Gatherings with Friends and Family: More than three times a week    Attends Religious Services: More than 4 times per year    Active Member of [employer] or Organizations: Yes    Attends Banker Meetings: More than 4 times per year    Marital Status: Widowed  Intimate Partner Violence: Not At Risk (03/31/2022)   Humiliation, Afraid, Rape, and Kick questionnaire    Fear of Current or Ex-Partner: No    Emotionally Abused: No    Physically Abused: No    Sexually Abused: No    Past  Surgical History:  Procedure Laterality Date   ABDOMINAL HYSTERECTOMY Bilateral 1999   w/b/l salpingo-oopherectomy   APPENDECTOMY     BREAST LUMPECTOMY WITH NEEDLE LOCALIZATION AND AXILLARY SENTINEL LYMPH NODE BX Left 09/12/2012   Procedure: LEFT NEEDLE LOCALIZATION BREAST LUMPECTOMY TION AND AXILLARY SENTINEL LYMPH NODE BX;  Surgeon: Mariella Saa, MD;  Location: [facility] OR;  Service: General;  Laterality: Left;   BREAST LUMPECTOMY WITH RADIOACTIVE SEED AND SENTINEL LYMPH NODE BIOPSY Right 10/26/2017   Procedure: BREAST LUMPECTOMY WITH RADIOACTIVE SEED AND SENTINEL LYMPH NODE BIOPSY;  Surgeon: Glenna Fellows, MD;  Location: [facility];  Service: General;  Laterality: Right;   BREAST SURGERY  1990's   right- fibroid cyst   CHOLECYSTECTOMY     SEPTOPLASTY     TONSILLECTOMY      Family History  Problem Relation Age of Onset   Hypertension Mother    Heart disease Mother        Murmur and irregular heart disease   Dementia Mother        d. 26   Hypertension Father    Aneurysm Father 81   Breast cancer Cousin 72       mat first cousin    Allergies  Allergen Reactions   Penicillins Hives    Has patient had a PCN reaction causing immediate rash, facial/tongue/throat swelling, SOB or lightheadedness with hypotension: No Has patient had a PCN reaction causing severe rash involving mucus membranes or skin necrosis: No Has patient had a PCN reaction that required hospitalization: No Has patient had a PCN reaction occurring within the last 10 years: No If all of the above answers are "NO", then may proceed with Cephalosporin use.   Other Hives   Prednisone Other (See Comments)    ELEVATED BLOOD SUGAR   Radiaplexrx [Wound Dressings] Hives   Sulfamethoxazole Nausea Only    Current Outpatient Medications on File Prior to Visit  Medication Sig Dispense Refill   glipiZIDE (GLUCOTROL XL) 2.5 MG 24 hr tablet Take 1 tablet (2.5 mg total) by mouth daily with breakfast. 90  tablet 1   Accu-Chek Softclix Lancets lancets Use to test up to 4 times daily. 100 each 12   allopurinol (ZYLOPRIM) 300 MG tablet TAKE 1 TABLET BY MOUTH EVERY DAY 90 tablet 3   atenolol (TENORMIN) 25 MG tablet TAKE 1 TABLET BY MOUTH EVERY DAY 90 tablet 3   azithromycin (ZITHROMAX) 250 MG tablet First dose given in the emergency department.  Start a daily tablet 2\19\2024 4 each  0   blood glucose meter kit and supplies KIT Dispense based on patient and insurance preference. Use up to four times daily as directed. 1 each 0   chlorpheniramine (CHLOR-TRIMETON) 4 MG tablet Take 4 mg 2 (two) times daily as needed by mouth for allergies.     diltiazem (CARDIZEM CD) 360 MG 24 hr capsule TAKE 1 CAPSULE BY MOUTH EVERY DAY 90 capsule 3   ELIQUIS 2.5 MG TABS tablet TAKE 1 TABLET BY MOUTH TWICE A DAY 60 tablet 0   fish oil-omega-3 fatty acids 1000 MG capsule Take 1 g by mouth daily.     glucose blood (ACCU-CHEK GUIDE) test strip TEST UP TO FOUR TIMES A DAY 100 strip 3   metFORMIN (GLUCOPHAGE) 500 MG tablet Take 1 tablet (500 mg total) by mouth 2 (two) times daily with a meal. 180 tablet 3   Multiple Vitamin (MULTIVITAMIN) tablet Take 1 tablet by mouth daily.     OVER THE COUNTER MEDICATION Take by mouth 2 (two) times daily. Presavision- for macular degeneration prevention     rosuvastatin (CRESTOR) 5 MG tablet TAKE 1 TABLET (5 MG TOTAL) BY MOUTH DAILY. 90 tablet 3   VITAMIN D, ERGOCALCIFEROL, PO Take by mouth.     No current facility-administered medications on file prior to visit.    BP 132/80   Pulse 90   Temp 98 F (36.7 C)   Wt 121 lb (54.9 kg)   SpO2 94%   BMI 22.86 kg/m       Objective:   Physical Exam Vitals and nursing note reviewed.  Constitutional:      Appearance: Normal appearance.  Cardiovascular:     Rate and Rhythm: Normal rate and regular rhythm.     Pulses: Normal pulses.     Heart sounds: Normal heart sounds.  Pulmonary:     Effort: Pulmonary effort is normal.      Breath sounds: Normal breath sounds.  Skin:    General: Skin is warm and dry.  Neurological:     General: No focal deficit present.     Mental Status: She is alert and oriented to person, place, and time.     Gait: Gait abnormal.  Psychiatric:        Mood and Affect: Mood normal.        Behavior: Behavior normal.        Thought Content: Thought content normal.        Judgment: Judgment normal.       Assessment & Plan:  1. Gait instability - I am not sure what I need to do to get [facility]. Prescription given. She will let me know if I need to do anything else   2. Left sided sciatica - See above  Shirline Frees, NP

## 2022-09-10 DIAGNOSIS — M25561 Pain in right knee: Secondary | ICD-10-CM | POA: Diagnosis not present

## 2022-09-10 DIAGNOSIS — M6281 Muscle weakness (generalized): Secondary | ICD-10-CM | POA: Diagnosis not present

## 2022-09-10 DIAGNOSIS — M5459 Other low back pain: Secondary | ICD-10-CM | POA: Diagnosis not present

## 2022-09-10 NOTE — Progress Notes (Signed)
Cardiology Clinic Note   Patient Name: Adrienne [name] Date of Encounter: 09/11/2022  Primary Care Provider:  Shirline Frees, NP Primary Cardiologist:  Adrienne Millers, MD  Patient Profile    88 year old female with known history of permanent atrial fibrillation, CHA2DS2-VASc score 5 on apixaban.  Most recent echocardiogram 02/12/2020 revealed normal LV systolic function EF of 55 to 16%, mildly elevated pulmonary artery systolic pressure, no significant valvular abnormalities.  Last seen by Dr. Jens [name] on 02/03/2021.  Other history includes type 2 diabetes, hypertension, chronic gout, vitamin D deficiency, hypercholesterolemia, and history of breast cancer.  Past Medical History    Past Medical History:  Diagnosis Date   Allergy    Atrial fibrillation (HCC)    Breast cancer (HCC) 08/25/12   Invasive ductal ca,DCIS   Diabetes mellitus    type II   Family history of cancer    Gout    Hearing loss    History of breast cancer    History of frequent urinary tract infections    History of radiation therapy 03/21/18- 04/19/18   Right Breast 2.67 Gy X 15 fractions, right breast boost 2 Gy X 5 fractions.    Hx of radiation therapy 11/22/12- 12/19/12   breast 4250 cGy 17 sessions, left breast boost 750 cGy 3 sessions   Hyperlipidemia    Hypertension    Osteopenia    Past Surgical History:  Procedure Laterality Date   ABDOMINAL HYSTERECTOMY Bilateral 1999   w/b/l salpingo-oopherectomy   APPENDECTOMY     BREAST LUMPECTOMY WITH NEEDLE LOCALIZATION AND AXILLARY SENTINEL LYMPH NODE BX Left 09/12/2012   Procedure: LEFT NEEDLE LOCALIZATION BREAST LUMPECTOMY TION AND AXILLARY SENTINEL LYMPH NODE BX;  Surgeon: Adrienne Saa, MD;  Location: [facility] OR;  Service: General;  Laterality: Left;   BREAST LUMPECTOMY WITH RADIOACTIVE SEED AND SENTINEL LYMPH NODE BIOPSY Right 10/26/2017   Procedure: BREAST LUMPECTOMY WITH RADIOACTIVE SEED AND SENTINEL LYMPH NODE BIOPSY;  Surgeon: Adrienne Fellows, MD;   Location: [facility];  Service: General;  Laterality: Right;   BREAST SURGERY  1990's   right- fibroid cyst   CHOLECYSTECTOMY     SEPTOPLASTY     TONSILLECTOMY      Allergies  Allergies  Allergen Reactions   Penicillins Hives    Has patient had a PCN reaction causing immediate rash, facial/tongue/throat swelling, SOB or lightheadedness with hypotension: No Has patient had a PCN reaction causing severe rash involving mucus membranes or skin necrosis: No Has patient had a PCN reaction that required hospitalization: No Has patient had a PCN reaction occurring within the last 10 years: No If all of the above answers are "NO", then may proceed with Cephalosporin use.   Other Hives   Prednisone Other (See Comments)    ELEVATED BLOOD SUGAR   Radiaplexrx [Wound Dressings] Hives   Sulfamethoxazole Nausea Only    History of Present Illness    Adrienne [name] is a very pleasant 88 year old female who we are following for ongoing assessment and management of hypertension, atrial fibrillation on Eliquis and hyperlipidemia.  She is a resident of [facility] and is very active at the center.  She takes yoga 3 times a week, does balance exercises, and walks 3 miles a day.  Her main complaint is sciatica and musculoskeletal pain of her lower back.  She is medically compliant, and follows up with her PCP regularly.  Home Medications    Current Outpatient Medications  Medication Sig Dispense Refill   Accu-Chek Softclix Lancets  lancets Use to test up to 4 times daily. 100 each 12   allopurinol (ZYLOPRIM) 300 MG tablet TAKE 1 TABLET BY MOUTH EVERY DAY 90 tablet 3   apixaban (ELIQUIS) 2.5 MG TABS tablet Take 1 tablet (2.5 mg total) by mouth 2 (two) times daily. 56 tablet 0   atenolol (TENORMIN) 25 MG tablet TAKE 1 TABLET BY MOUTH EVERY DAY 90 tablet 3   azithromycin (ZITHROMAX) 250 MG tablet First dose given in the emergency department.  Start a daily tablet 2\19\2024 4 each 0   blood  glucose meter kit and supplies KIT Dispense based on patient and insurance preference. Use up to four times daily as directed. 1 each 0   chlorpheniramine (CHLOR-TRIMETON) 4 MG tablet Take 4 mg 2 (two) times daily as needed by mouth for allergies.     diltiazem (CARDIZEM CD) 360 MG 24 hr capsule TAKE 1 CAPSULE BY MOUTH EVERY DAY 90 capsule 3   ELIQUIS 2.5 MG TABS tablet TAKE 1 TABLET BY MOUTH TWICE A DAY 60 tablet 0   fish oil-omega-3 fatty acids 1000 MG capsule Take 1 g by mouth daily.     glipiZIDE (GLUCOTROL XL) 2.5 MG 24 hr tablet Take 1 tablet (2.5 mg total) by mouth daily with breakfast. 90 tablet 1   glucose blood (ACCU-CHEK GUIDE) test strip TEST UP TO FOUR TIMES A DAY 100 strip 3   metFORMIN (GLUCOPHAGE) 500 MG tablet Take 1 tablet (500 mg total) by mouth 2 (two) times daily with a meal. 180 tablet 3   Multiple Vitamin (MULTIVITAMIN) tablet Take 1 tablet by mouth daily.     OVER THE COUNTER MEDICATION Take by mouth 2 (two) times daily. Presavision- for macular degeneration prevention     rosuvastatin (CRESTOR) 5 MG tablet TAKE 1 TABLET (5 MG TOTAL) BY MOUTH DAILY. 90 tablet 3   VITAMIN D, ERGOCALCIFEROL, PO Take by mouth.     No current facility-administered medications for this visit.     Family History    Family History  Problem Relation Age of Onset   Hypertension Mother    Heart disease Mother        Murmur and irregular heart disease   Dementia Mother        d. 42   Hypertension Father    Aneurysm Father 37   Breast cancer Cousin 90       mat first cousin   She indicated that her mother is deceased. She indicated that her father is deceased. She indicated that her maternal grandmother is deceased. She indicated that her maternal grandfather is deceased. She indicated that her paternal grandmother is deceased. She indicated that her paternal grandfather is deceased. She indicated that her maternal aunt is deceased. She indicated that her maternal uncle is deceased. She  indicated that her paternal aunt is deceased. She indicated that her paternal uncle is deceased. She indicated that her cousin is deceased.  Social History    Social History   Socioeconomic History   Marital status: Married    Spouse name: Not on file   Number of children: 3   Years of education: Not on file   Highest education level: Not on file  Occupational History   Not on file  Tobacco Use   Smoking status: Former    Packs/day: 0.25    Years: 4.00    Additional pack years: 0.00    Total pack years: 1.00    Types: Cigarettes   Smokeless tobacco: Never  Tobacco comments:    Cigarette use was 50 years ago  Vaping Use   Vaping Use: Never used  Substance and Sexual Activity   Alcohol use: Not Currently   Drug use: No   Sexual activity: Not Currently    Comment: menarche age 59, 1st birth age 31, G54, HRT x 7 years?  Other Topics Concern   Not on file  Social History Narrative   Married - husband in memory care unit    Three children ( One lives at [place] and two in [place]      She likes to read and go to [employer]    Social Determinants of Health   Financial Resource Strain: Low Risk  (07/15/2022)   Overall Financial Resource Strain (CARDIA)    Difficulty of Paying Living Expenses: Not hard at all  Food Insecurity: No Food Insecurity (07/15/2022)   Hunger Vital Sign    Worried About Running Out of Food in the Last Year: Never true    Ran Out of Food in the Last Year: Never true  Transportation Needs: No Transportation Needs (07/15/2022)   PRAPARE - Administrator, Civil Service (Medical): No    Lack of Transportation (Non-Medical): No  Physical Activity: Sufficiently Active (03/31/2022)   Exercise Vital Sign    Days of Exercise per Week: 7 days    Minutes of Exercise per Session: 90 min  Stress: No Stress Concern Present (03/31/2022)   [employer] of Occupational Health - Occupational Stress Questionnaire    Feeling of Stress : Not at all   Social Connections: Moderately Integrated (03/31/2022)   Social Connection and Isolation Panel [NHANES]    Frequency of Communication with Friends and Family: More than three times a week    Frequency of Social Gatherings with Friends and Family: More than three times a week    Attends Religious Services: More than 4 times per year    Active Member of [employer] or Organizations: Yes    Attends Banker Meetings: More than 4 times per year    Marital Status: Widowed  Intimate Partner Violence: Not At Risk (03/31/2022)   Humiliation, Afraid, Rape, and Kick questionnaire    Fear of Current or Ex-Partner: No    Emotionally Abused: No    Physically Abused: No    Sexually Abused: No     Review of Systems    General:  No chills, fever, night sweats or weight changes.  Cardiovascular:  No chest pain, dyspnea on exertion, edema, orthopnea, palpitations, paroxysmal nocturnal dyspnea. Dermatological: No rash, lesions/masses, no excessive bruising or bleeding Respiratory: No cough, dyspnea Urologic: No hematuria, dysuria Abdominal:   No nausea, vomiting, diarrhea, bright red blood per rectum, melena, or hematemesis Neurologic:  No visual changes, wkns, changes in mental status.  Admits to being unsteady on her feet using a cane for ambulation.  Undergoing physical therapy All other systems reviewed and are otherwise negative except as noted above.     Physical Exam    VS:  BP (!) 142/76 (BP Location: Left Arm, Patient Position: Sitting, Cuff Size: Normal)   Pulse 88   Ht 5\' 1"  (1.549 m)   Wt 120 lb 9.6 oz (54.7 kg)   SpO2 97%   BMI 22.79 kg/m  , BMI Body mass index is 22.79 kg/m.     GEN: Well nourished, well developed, in no acute distress. HEENT: normal. Neck: Supple, no JVD, carotid bruits, or masses. Cardiac: RRR, 2/6 systolic murmurs both right  and left sternal borders, no rubs, or gallops. No clubbing, cyanosis, edema.  Radials/DP/PT 2+ and equal bilaterally.   Respiratory:  Respirations regular and unlabored, clear to auscultation bilaterally. GI: Soft, nontender, nondistended, BS + x 4. MS: no deformity or atrophy.  Sciatica pain and hip pain with movement to exam table. Skin: warm and dry, no rash. Neuro:  Strength and sensation are intact.  Very hard of hearing. Psych: Normal affect.  Accessory Clinical Findings    ECG personally reviewed by me today-atrial fibrillation heart rate 88 bpm, left axis deviation.  Some artifact changing isoelectric line.  Lab Results  Component Value Date   WBC 8.1 06/21/2022   HGB 14.1 06/21/2022   HCT 42.3 06/21/2022   MCV 95.5 06/21/2022   PLT 193 06/21/2022   Lab Results  Component Value Date   CREATININE 1.03 06/26/2022   BUN 19 06/26/2022   NA 142 06/26/2022   K 4.1 06/26/2022   CL 103 06/26/2022   CO2 28 06/26/2022   Lab Results  Component Value Date   ALT 30 06/26/2022   AST 26 06/26/2022   ALKPHOS 92 06/26/2022   BILITOT 0.5 06/26/2022   Lab Results  Component Value Date   CHOL 85 06/26/2022   HDL 46.90 06/26/2022   LDLCALC 20 06/26/2022   LDLDIRECT 124.0 07/22/2006   TRIG 87.0 06/26/2022   CHOLHDL 2 06/26/2022    Lab Results  Component Value Date   HGBA1C 8.2 (H) 06/26/2022    Review of Prior Studies: Echocardiogram 02/12/2020 1. Left ventricular ejection fraction, by estimation, is 55 to 60%. The  left ventricle has normal function. The left ventricle has no regional  wall motion abnormalities. There is mild left ventricular hypertrophy.  Left ventricular diastolic parameters  are indeterminate.   2. Right ventricular systolic function is normal. The right ventricular  size is normal. There is mildly elevated pulmonary artery systolic  pressure. The estimated right ventricular systolic pressure is 42.4 mmHg.   3. The mitral valve is normal in structure. Trivial mitral valve  regurgitation.   4. The aortic valve is tricuspid. Aortic valve regurgitation is not   visualized. Mild aortic valve sclerosis is present, with no evidence of  aortic valve stenosis.   5. Evidence of atrial level shunting detected by color flow Doppler.  Likely PFO.   6. The inferior vena cava is normal in size with greater than 50%  respiratory variability, suggesting right atrial pressure of 3 mmHg.    Assessment & Plan   1.  Atrial fibrillation: Heart rate is essentially controlled on atenolol 25 mg daily.  She remains on apixaban 2.5 mg twice daily.  Samples are provided as she is complaining of the high cost of this medication.  She denies any bleeding or excessive bruising.  She denies any chest pain or shortness of breath with her exercise activities.  She is cardiac unaware.  Will continue current medication regimen.  PCP is monitoring her labs which should include a CBC periodically.  2.  Hypertension: Slightly elevated today.  She is having some lower back pain and sciatic pain from walking.  Will continue to monitor this.  Prior blood pressures on review are much better controlled away from the office.  3.  Hypercholesterolemia: Labs are drawn by PCP.  Goal of LDL less than 70.  She remains on statin therapy with Crestor 5 mg daily.  There is some occasional muscle aches and pains which she believes to be related to her exercise.  Will continue to monitor this for any myalgia pain that is significant and related to statin therapy.  4.  Balance issues: She reports that she is unsteady on her feet and did not fall but had to sit down because her cane became unsteady stepping down from a curb.  She is now undergoing physical therapy.  I have advised her to please be mindful of falling especially if she hits her head especially being on on the apixaban.  She will need to be seen in the ED for any hard falls.  She verbalizes understanding       Signed, Bettey Mare. Liborio Nixon, ANP, AACC   09/11/2022 12:09 PM      Office [phone] Fax [phone]  Notice: This  dictation was prepared with Dragon dictation along with smaller phrase technology. Any transcriptional errors that result from this process are unintentional and may not be corrected upon review.

## 2022-09-11 ENCOUNTER — Encounter: Payer: Self-pay | Admitting: Adult Health

## 2022-09-11 ENCOUNTER — Ambulatory Visit: Payer: Medicare PPO | Attending: Adult Health | Admitting: Adult Health

## 2022-09-11 VITALS — BP 142/76 | HR 88 | Ht 61.0 in | Wt 120.6 lb

## 2022-09-11 DIAGNOSIS — R2689 Other abnormalities of gait and mobility: Secondary | ICD-10-CM

## 2022-09-11 DIAGNOSIS — I4821 Permanent atrial fibrillation: Secondary | ICD-10-CM | POA: Diagnosis not present

## 2022-09-11 DIAGNOSIS — E7800 Pure hypercholesterolemia, unspecified: Secondary | ICD-10-CM

## 2022-09-11 DIAGNOSIS — I10 Essential (primary) hypertension: Secondary | ICD-10-CM | POA: Diagnosis not present

## 2022-09-11 MED ORDER — APIXABAN 2.5 MG PO TABS: 2.5000 mg | ORAL_TABLET | Freq: Two times a day (BID) | ORAL | 0 refills | Status: DC

## 2022-09-11 NOTE — Patient Instructions (Signed)
Medication Instructions:  No Changes *If you need a refill on your cardiac medications before your next appointment, please call your pharmacy*   Lab Work: No Labs If you have labs (blood work) drawn today and your tests are completely normal, you will receive your results only by: MyChart Message (if you have MyChart) OR A paper copy in the mail If you have any lab test that is abnormal or we need to change your treatment, we will call you to review the results.   Testing/Procedures: No Testing   Follow-Up: At [facility], you and your health needs are our priority.  As part of our continuing mission to provide you with exceptional heart care, we have created designated Provider Care Teams.  These Care Teams include your primary Cardiologist (physician) and Advanced Practice Providers (APPs -  Physician Assistants and Nurse Practitioners) who all work together to provide you with the care you need, when you need it.  We recommend signing up for the patient portal called "MyChart".  Sign up information is provided on this After Visit Summary.  MyChart is used to connect with patients for Virtual Visits (Telemedicine).  Patients are able to view lab/test results, encounter notes, upcoming appointments, etc.  Non-urgent messages can be sent to your provider as well.   To learn more about what you can do with MyChart, go to [email or web address].    Your next appointment:   1 year(s)  Provider:   Brian Crenshaw, MD   

## 2022-09-14 DIAGNOSIS — M6281 Muscle weakness (generalized): Secondary | ICD-10-CM | POA: Diagnosis not present

## 2022-09-14 DIAGNOSIS — M25561 Pain in right knee: Secondary | ICD-10-CM | POA: Diagnosis not present

## 2022-09-14 DIAGNOSIS — M5459 Other low back pain: Secondary | ICD-10-CM | POA: Diagnosis not present

## 2022-09-15 DIAGNOSIS — M5459 Other low back pain: Secondary | ICD-10-CM | POA: Diagnosis not present

## 2022-09-15 DIAGNOSIS — M25561 Pain in right knee: Secondary | ICD-10-CM | POA: Diagnosis not present

## 2022-09-15 DIAGNOSIS — M6281 Muscle weakness (generalized): Secondary | ICD-10-CM | POA: Diagnosis not present

## 2022-09-23 DIAGNOSIS — M25561 Pain in right knee: Secondary | ICD-10-CM | POA: Diagnosis not present

## 2022-09-23 DIAGNOSIS — M5459 Other low back pain: Secondary | ICD-10-CM | POA: Diagnosis not present

## 2022-09-23 DIAGNOSIS — M6281 Muscle weakness (generalized): Secondary | ICD-10-CM | POA: Diagnosis not present

## 2022-09-24 ENCOUNTER — Ambulatory Visit: Payer: Medicare PPO | Admitting: Adult Health

## 2022-09-25 DIAGNOSIS — M6281 Muscle weakness (generalized): Secondary | ICD-10-CM | POA: Diagnosis not present

## 2022-09-25 DIAGNOSIS — M25561 Pain in right knee: Secondary | ICD-10-CM | POA: Diagnosis not present

## 2022-09-25 DIAGNOSIS — M5459 Other low back pain: Secondary | ICD-10-CM | POA: Diagnosis not present

## 2022-09-28 DIAGNOSIS — M5459 Other low back pain: Secondary | ICD-10-CM | POA: Diagnosis not present

## 2022-09-28 DIAGNOSIS — M6281 Muscle weakness (generalized): Secondary | ICD-10-CM | POA: Diagnosis not present

## 2022-09-28 DIAGNOSIS — M25561 Pain in right knee: Secondary | ICD-10-CM | POA: Diagnosis not present

## 2022-09-30 ENCOUNTER — Telehealth: Payer: Self-pay | Admitting: Hematology

## 2022-09-30 DIAGNOSIS — M6281 Muscle weakness (generalized): Secondary | ICD-10-CM | POA: Diagnosis not present

## 2022-09-30 DIAGNOSIS — M5459 Other low back pain: Secondary | ICD-10-CM | POA: Diagnosis not present

## 2022-09-30 DIAGNOSIS — M25561 Pain in right knee: Secondary | ICD-10-CM | POA: Diagnosis not present

## 2022-09-30 NOTE — Telephone Encounter (Signed)
Patient is aware that appointment has been rescheduled.

## 2022-10-02 DIAGNOSIS — M5459 Other low back pain: Secondary | ICD-10-CM | POA: Diagnosis not present

## 2022-10-02 DIAGNOSIS — M6281 Muscle weakness (generalized): Secondary | ICD-10-CM | POA: Diagnosis not present

## 2022-10-02 DIAGNOSIS — M25561 Pain in right knee: Secondary | ICD-10-CM | POA: Diagnosis not present

## 2022-10-03 ENCOUNTER — Other Ambulatory Visit: Payer: Self-pay | Admitting: Adult Health

## 2022-10-03 DIAGNOSIS — E139 Other specified diabetes mellitus without complications: Secondary | ICD-10-CM

## 2022-10-06 ENCOUNTER — Ambulatory Visit: Payer: Medicare PPO | Admitting: Adult Health

## 2022-10-06 ENCOUNTER — Encounter: Payer: Self-pay | Admitting: Adult Health

## 2022-10-06 VITALS — BP 120/82 | HR 75 | Temp 97.7°F | Ht 61.0 in | Wt 122.0 lb

## 2022-10-06 DIAGNOSIS — I10 Essential (primary) hypertension: Secondary | ICD-10-CM

## 2022-10-06 DIAGNOSIS — R2681 Unsteadiness on feet: Secondary | ICD-10-CM | POA: Diagnosis not present

## 2022-10-06 DIAGNOSIS — M5432 Sciatica, left side: Secondary | ICD-10-CM | POA: Diagnosis not present

## 2022-10-06 DIAGNOSIS — E139 Other specified diabetes mellitus without complications: Secondary | ICD-10-CM | POA: Diagnosis not present

## 2022-10-06 DIAGNOSIS — Z7984 Long term (current) use of oral hypoglycemic drugs: Secondary | ICD-10-CM | POA: Diagnosis not present

## 2022-10-06 LAB — POCT GLYCOSYLATED HEMOGLOBIN (HGB A1C): Hemoglobin A1C: 6.3 % — AB (ref 4.0–5.6)

## 2022-10-06 NOTE — Patient Instructions (Addendum)
  Your A1c was 6.3 - this is great but I am afraid it may start to get too low   I want to stop Metformin for the next 3 months   Follow up in 3 months or sooner if needed

## 2022-10-06 NOTE — Progress Notes (Signed)
Subjective:    Patient ID: Adrienne [name], female    DOB: 1934-08-29, 88 y.o.   MRN: [id number]  HPI 88 year old female who  has a past medical history of Allergy, Atrial fibrillation (HCC), Breast cancer (HCC) (08/25/12), Diabetes mellitus, Family history of cancer, Gout, Hearing loss, History of breast cancer, History of frequent urinary tract infections, History of radiation therapy (03/21/18- 04/19/18), radiation therapy (11/22/12- 12/19/12), Hyperlipidemia, Hypertension, and Osteopenia.  Diabetes mellitus type II-managed with metformin 500 mg twice daily and glipizide 2.5 mg XR daily.   She does not check her blood sugars at home on a routine basis.  She denies episodes of hypoglycemia.  She does try and stay active with yoga and cardio classes multiple times a week and then does weight training 7 days a week.  Lab Results  Component Value Date   HGBA1C 8.2 (H) 06/26/2022   HTN -managed with Cardizem 360 mg daily and atenolol 5 mg daily.  She denies dizziness, lightheadedness, chest pain, or shortness of breath. She has not taken her blood pressure medication this morning.  BP Readings from Last 3 Encounters:  10/06/22 120/82  09/11/22 (!) 142/76  09/01/22 132/80   She has been working with PT at [facility]  for left sided sciatica and gait instability and reports that she has noticed significant improvement   Review of Systems See HPI   Past Medical History:  Diagnosis Date   Allergy    Atrial fibrillation (HCC)    Breast cancer (HCC) 08/25/12   Invasive ductal ca,DCIS   Diabetes mellitus    type II   Family history of cancer    Gout    Hearing loss    History of breast cancer    History of frequent urinary tract infections    History of radiation therapy 03/21/18- 04/19/18   Right Breast 2.67 Gy X 15 fractions, right breast boost 2 Gy X 5 fractions.    Hx of radiation therapy 11/22/12- 12/19/12   breast 4250 cGy 17 sessions, left breast boost 750 cGy 3 sessions    Hyperlipidemia    Hypertension    Osteopenia     Social History   Socioeconomic History   Marital status: Married    Spouse name: Not on file   Number of children: 3   Years of education: Not on file   Highest education level: Not on file  Occupational History   Not on file  Tobacco Use   Smoking status: Former    Packs/day: 0.25    Years: 4.00    Additional pack years: 0.00    Total pack years: 1.00    Types: Cigarettes   Smokeless tobacco: Never   Tobacco comments:    Cigarette use was 50 years ago  Vaping Use   Vaping Use: Never used  Substance and Sexual Activity   Alcohol use: Not Currently   Drug use: No   Sexual activity: Not Currently    Comment: menarche age 24, 1st birth age 62, G36, HRT x 7 years?  Other Topics Concern   Not on file  Social History Narrative   Married - husband in memory care unit    Three children ( One lives at [place] and two in [place]      She likes to read and go to [employer]    Social Determinants of Health   Financial Resource Strain: Low Risk  (07/15/2022)   Overall Financial Resource Strain (CARDIA)    Difficulty  of Paying Living Expenses: Not hard at all  Food Insecurity: No Food Insecurity (07/15/2022)   Hunger Vital Sign    Worried About Running Out of Food in the Last Year: Never true    Ran Out of Food in the Last Year: Never true  Transportation Needs: No Transportation Needs (07/15/2022)   PRAPARE - Administrator, Civil Service (Medical): No    Lack of Transportation (Non-Medical): No  Physical Activity: Sufficiently Active (03/31/2022)   Exercise Vital Sign    Days of Exercise per Week: 7 days    Minutes of Exercise per Session: 90 min  Stress: No Stress Concern Present (03/31/2022)   [employer] of Occupational Health - Occupational Stress Questionnaire    Feeling of Stress : Not at all  Social Connections: Moderately Integrated (03/31/2022)   Social Connection and Isolation Panel [NHANES]     Frequency of Communication with Friends and Family: More than three times a week    Frequency of Social Gatherings with Friends and Family: More than three times a week    Attends Religious Services: More than 4 times per year    Active Member of [employer] or Organizations: Yes    Attends Banker Meetings: More than 4 times per year    Marital Status: Widowed  Intimate Partner Violence: Not At Risk (03/31/2022)   Humiliation, Afraid, Rape, and Kick questionnaire    Fear of Current or Ex-Partner: No    Emotionally Abused: No    Physically Abused: No    Sexually Abused: No    Past Surgical History:  Procedure Laterality Date   ABDOMINAL HYSTERECTOMY Bilateral 1999   w/b/l salpingo-oopherectomy   APPENDECTOMY     BREAST LUMPECTOMY WITH NEEDLE LOCALIZATION AND AXILLARY SENTINEL LYMPH NODE BX Left 09/12/2012   Procedure: LEFT NEEDLE LOCALIZATION BREAST LUMPECTOMY TION AND AXILLARY SENTINEL LYMPH NODE BX;  Surgeon: Mariella Saa, MD;  Location: [facility] OR;  Service: General;  Laterality: Left;   BREAST LUMPECTOMY WITH RADIOACTIVE SEED AND SENTINEL LYMPH NODE BIOPSY Right 10/26/2017   Procedure: BREAST LUMPECTOMY WITH RADIOACTIVE SEED AND SENTINEL LYMPH NODE BIOPSY;  Surgeon: Glenna Fellows, MD;  Location: [facility];  Service: General;  Laterality: Right;   BREAST SURGERY  1990's   right- fibroid cyst   CHOLECYSTECTOMY     SEPTOPLASTY     TONSILLECTOMY      Family History  Problem Relation Age of Onset   Hypertension Mother    Heart disease Mother        Murmur and irregular heart disease   Dementia Mother        d. 73   Hypertension Father    Aneurysm Father 51   Breast cancer Cousin 72       mat first cousin    Allergies  Allergen Reactions   Penicillins Hives    Has patient had a PCN reaction causing immediate rash, facial/tongue/throat swelling, SOB or lightheadedness with hypotension: No Has patient had a PCN reaction causing severe rash  involving mucus membranes or skin necrosis: No Has patient had a PCN reaction that required hospitalization: No Has patient had a PCN reaction occurring within the last 10 years: No If all of the above answers are "NO", then may proceed with Cephalosporin use.   Other Hives   Prednisone Other (See Comments)    ELEVATED BLOOD SUGAR   Radiaplexrx [Wound Dressings] Hives   Sulfamethoxazole Nausea Only    Current Outpatient Medications on File  Prior to Visit  Medication Sig Dispense Refill   Accu-Chek Softclix Lancets lancets Use to test up to 4 times daily. 100 each 12   allopurinol (ZYLOPRIM) 300 MG tablet TAKE 1 TABLET BY MOUTH EVERY DAY 90 tablet 3   apixaban (ELIQUIS) 2.5 MG TABS tablet Take 1 tablet (2.5 mg total) by mouth 2 (two) times daily. 56 tablet 0   atenolol (TENORMIN) 25 MG tablet TAKE 1 TABLET BY MOUTH EVERY DAY 90 tablet 3   blood glucose meter kit and supplies KIT Dispense based on patient and insurance preference. Use up to four times daily as directed. 1 each 0   chlorpheniramine (CHLOR-TRIMETON) 4 MG tablet Take 4 mg 2 (two) times daily as needed by mouth for allergies.     diltiazem (CARDIZEM CD) 360 MG 24 hr capsule TAKE 1 CAPSULE BY MOUTH EVERY DAY 90 capsule 3   ELIQUIS 2.5 MG TABS tablet TAKE 1 TABLET BY MOUTH TWICE A DAY 60 tablet 0   fish oil-omega-3 fatty acids 1000 MG capsule Take 1 g by mouth daily.     glipiZIDE (GLUCOTROL XL) 2.5 MG 24 hr tablet Take 1 tablet (2.5 mg total) by mouth daily with breakfast. 90 tablet 1   glucose blood (ACCU-CHEK GUIDE) test strip TEST UP TO FOUR TIMES A DAY 100 strip 3   metFORMIN (GLUCOPHAGE) 500 MG tablet Take 1 tablet (500 mg total) by mouth 2 (two) times daily with a meal. 180 tablet 3   Multiple Vitamin (MULTIVITAMIN) tablet Take 1 tablet by mouth daily.     OVER THE COUNTER MEDICATION Take by mouth 2 (two) times daily. Presavision- for macular degeneration prevention     rosuvastatin (CRESTOR) 5 MG tablet TAKE 1 TABLET (5 MG  TOTAL) BY MOUTH DAILY. 90 tablet 3   VITAMIN D, ERGOCALCIFEROL, PO Take by mouth.     No current facility-administered medications on file prior to visit.    BP 120/82   Pulse 75   Temp 97.7 F (36.5 C) (Oral)   Ht 5\' 1"  (1.549 m)   Wt 122 lb (55.3 kg)   SpO2 95%   BMI 23.05 kg/m       Objective:   Physical Exam Vitals and nursing note reviewed.  Constitutional:      Appearance: Normal appearance.  Cardiovascular:     Rate and Rhythm: Normal rate. Rhythm irregular.     Pulses: Normal pulses.     Heart sounds: Normal heart sounds.  Pulmonary:     Effort: Pulmonary effort is normal.     Breath sounds: Normal breath sounds.  Skin:    General: Skin is warm and dry.  Neurological:     General: No focal deficit present.     Mental Status: She is alert and oriented to person, place, and time.     Gait: Gait abnormal (walks with cane).  Psychiatric:        Mood and Affect: Mood normal.        Behavior: Behavior normal.        Thought Content: Thought content normal.        Judgment: Judgment normal.       Assessment & Plan:  1. Gait instability - Improving with PT. Continue with PT   2. Left sided sciatica - Improving with PT. Continue PT   3. Diabetes 1.5, managed as type 2 (HCC)  - POC HgB A1c- 6.3  - Will have her stop Metformin and continue with Glipizide as I would  prefer her A1c to be in the 7 range.  - Follow up in 3 months   4. Essential hypertension - Well controlled. No change in medication

## 2022-10-07 DIAGNOSIS — M25561 Pain in right knee: Secondary | ICD-10-CM | POA: Diagnosis not present

## 2022-10-07 DIAGNOSIS — M5459 Other low back pain: Secondary | ICD-10-CM | POA: Diagnosis not present

## 2022-10-07 DIAGNOSIS — M6281 Muscle weakness (generalized): Secondary | ICD-10-CM | POA: Diagnosis not present

## 2022-10-08 DIAGNOSIS — R32 Unspecified urinary incontinence: Secondary | ICD-10-CM | POA: Diagnosis not present

## 2022-10-08 DIAGNOSIS — M1990 Unspecified osteoarthritis, unspecified site: Secondary | ICD-10-CM | POA: Diagnosis not present

## 2022-10-08 DIAGNOSIS — E785 Hyperlipidemia, unspecified: Secondary | ICD-10-CM | POA: Diagnosis not present

## 2022-10-08 DIAGNOSIS — R2681 Unsteadiness on feet: Secondary | ICD-10-CM | POA: Diagnosis not present

## 2022-10-08 DIAGNOSIS — I4891 Unspecified atrial fibrillation: Secondary | ICD-10-CM | POA: Diagnosis not present

## 2022-10-08 DIAGNOSIS — I129 Hypertensive chronic kidney disease with stage 1 through stage 4 chronic kidney disease, or unspecified chronic kidney disease: Secondary | ICD-10-CM | POA: Diagnosis not present

## 2022-10-08 DIAGNOSIS — D6869 Other thrombophilia: Secondary | ICD-10-CM | POA: Diagnosis not present

## 2022-10-08 DIAGNOSIS — M5430 Sciatica, unspecified side: Secondary | ICD-10-CM | POA: Diagnosis not present

## 2022-10-08 DIAGNOSIS — M810 Age-related osteoporosis without current pathological fracture: Secondary | ICD-10-CM | POA: Diagnosis not present

## 2022-10-09 DIAGNOSIS — M25561 Pain in right knee: Secondary | ICD-10-CM | POA: Diagnosis not present

## 2022-10-09 DIAGNOSIS — M6281 Muscle weakness (generalized): Secondary | ICD-10-CM | POA: Diagnosis not present

## 2022-10-09 DIAGNOSIS — M5459 Other low back pain: Secondary | ICD-10-CM | POA: Diagnosis not present

## 2022-10-12 DIAGNOSIS — M5459 Other low back pain: Secondary | ICD-10-CM | POA: Diagnosis not present

## 2022-10-12 DIAGNOSIS — M6281 Muscle weakness (generalized): Secondary | ICD-10-CM | POA: Diagnosis not present

## 2022-10-12 DIAGNOSIS — M25561 Pain in right knee: Secondary | ICD-10-CM | POA: Diagnosis not present

## 2022-10-14 DIAGNOSIS — M25561 Pain in right knee: Secondary | ICD-10-CM | POA: Diagnosis not present

## 2022-10-14 DIAGNOSIS — M6281 Muscle weakness (generalized): Secondary | ICD-10-CM | POA: Diagnosis not present

## 2022-10-14 DIAGNOSIS — M5459 Other low back pain: Secondary | ICD-10-CM | POA: Diagnosis not present

## 2022-10-16 DIAGNOSIS — M6281 Muscle weakness (generalized): Secondary | ICD-10-CM | POA: Diagnosis not present

## 2022-10-16 DIAGNOSIS — M25561 Pain in right knee: Secondary | ICD-10-CM | POA: Diagnosis not present

## 2022-10-16 DIAGNOSIS — M5459 Other low back pain: Secondary | ICD-10-CM | POA: Diagnosis not present

## 2022-10-19 DIAGNOSIS — M25561 Pain in right knee: Secondary | ICD-10-CM | POA: Diagnosis not present

## 2022-10-19 DIAGNOSIS — M5459 Other low back pain: Secondary | ICD-10-CM | POA: Diagnosis not present

## 2022-10-19 DIAGNOSIS — M6281 Muscle weakness (generalized): Secondary | ICD-10-CM | POA: Diagnosis not present

## 2022-10-21 DIAGNOSIS — M5459 Other low back pain: Secondary | ICD-10-CM | POA: Diagnosis not present

## 2022-10-21 DIAGNOSIS — M25561 Pain in right knee: Secondary | ICD-10-CM | POA: Diagnosis not present

## 2022-10-21 DIAGNOSIS — M6281 Muscle weakness (generalized): Secondary | ICD-10-CM | POA: Diagnosis not present

## 2022-10-23 DIAGNOSIS — M5459 Other low back pain: Secondary | ICD-10-CM | POA: Diagnosis not present

## 2022-10-23 DIAGNOSIS — M6281 Muscle weakness (generalized): Secondary | ICD-10-CM | POA: Diagnosis not present

## 2022-10-23 DIAGNOSIS — M25561 Pain in right knee: Secondary | ICD-10-CM | POA: Diagnosis not present

## 2022-10-26 DIAGNOSIS — M6281 Muscle weakness (generalized): Secondary | ICD-10-CM | POA: Diagnosis not present

## 2022-10-26 DIAGNOSIS — M25561 Pain in right knee: Secondary | ICD-10-CM | POA: Diagnosis not present

## 2022-10-26 DIAGNOSIS — M5459 Other low back pain: Secondary | ICD-10-CM | POA: Diagnosis not present

## 2022-10-28 ENCOUNTER — Other Ambulatory Visit: Payer: Self-pay | Admitting: Cardiology

## 2022-10-28 DIAGNOSIS — M5459 Other low back pain: Secondary | ICD-10-CM | POA: Diagnosis not present

## 2022-10-28 DIAGNOSIS — I4821 Permanent atrial fibrillation: Secondary | ICD-10-CM

## 2022-10-28 DIAGNOSIS — M25561 Pain in right knee: Secondary | ICD-10-CM | POA: Diagnosis not present

## 2022-10-28 DIAGNOSIS — M6281 Muscle weakness (generalized): Secondary | ICD-10-CM | POA: Diagnosis not present

## 2022-10-28 NOTE — Telephone Encounter (Signed)
Prescription refill request for Eliquis received. Indication: AF Last office visit: 09/11/22  Heron Nay NP Scr: 1.03 on 06/26/22 Age: 88 Weight: 54.7kg  Based on above findings Eliquis 2.5mg  twice daily is the appropriate dose.  Refill approved.

## 2022-10-29 ENCOUNTER — Ambulatory Visit: Payer: Medicare PPO | Admitting: Hematology

## 2022-10-29 ENCOUNTER — Other Ambulatory Visit: Payer: Medicare PPO

## 2022-11-11 DIAGNOSIS — M25561 Pain in right knee: Secondary | ICD-10-CM | POA: Diagnosis not present

## 2022-11-11 DIAGNOSIS — M6281 Muscle weakness (generalized): Secondary | ICD-10-CM | POA: Diagnosis not present

## 2022-11-11 DIAGNOSIS — M5459 Other low back pain: Secondary | ICD-10-CM | POA: Diagnosis not present

## 2022-11-13 DIAGNOSIS — M25561 Pain in right knee: Secondary | ICD-10-CM | POA: Diagnosis not present

## 2022-11-13 DIAGNOSIS — M6281 Muscle weakness (generalized): Secondary | ICD-10-CM | POA: Diagnosis not present

## 2022-11-13 DIAGNOSIS — M5459 Other low back pain: Secondary | ICD-10-CM | POA: Diagnosis not present

## 2022-11-16 NOTE — Progress Notes (Unsigned)
Patient Care Team: Shirline Frees, NP as PCP - General (Family Medicine) Jens Som Madolyn Frieze, MD as PCP - Cardiology (Cardiology) Pollyann Samples, NP as Nurse Practitioner (Hematology and Oncology)   CHIEF COMPLAINT: Follow up left breast cancer   Oncology History Overview Note  Cancer Staging Breast cancer of upper-outer quadrant of right female breast [facility]) Staging form: Breast, AJCC 8th Edition - Clinical stage from 09/22/2017: Stage IB (cT1b, cN0, cM0, G2, ER-, PR-, HER2-) - Signed by Malachy Mood, MD on 10/08/2017  Breast cancer, left breast [facility]) Staging form: Breast, AJCC 7th Edition - Clinical stage from 09/12/2012: Stage IA (T1b, N0, M0) - Unsigned      Breast cancer, left breast (HCC)  08/25/2012 Receptors her2   ER 100% positive, PR 88% positive, HER-2 negative, Ki-67 16%   08/30/2012 Initial Diagnosis   Breast cancer, left breast   09/12/2012 Pathology Results   T1bN0 Grade 1 invasive ductal carcinoma, and DCIS.   09/12/2012 Surgery   Left breast lumpectomy and sentinel lymph node biopsy, negative margins.   11/09/2012 - 12/13/2012 Radiation Therapy   Adjuvant breast radiation   12/2012 - 11/2017 Anti-estrogen oral therapy   Anastrozole 1 mg daily, switched to Aromasin 25 mg daily in Jan 2015 due to diarrhea. Her Exemestane was switched to letrozole in 08/2017 due to high copay. She completed her 5 years in 11/2017.    09/01/2016 Imaging   [name] Outside films Breast form [facility] 09/01/2016 IMPRESSION: Probably Benign 1. No significant change in oval hypoechoic masses in the left breast at 11:00 2 cm from the nipple and 10:30 4 cm from the nipple. Both of these masses resemble the area of fat necrosis previously biopsies at 2:00. In addition, both masses have developing central benign or dystrophic appearing calcifications and are favored to be other areas of fat necrosis. A 6 month follow-up left breast mammogram and ultrasound is recommended.   09/01/2016 Mammogram   [facility] Mammogram  from [facility] 09/01/16 IMPRESSION  Incomplete - additional imaging evaluation needed Ultrasound of the upper medial left breast mass is recommended.    03/09/2017 Mammogram   Previous lumpectomy changes in the upper outer left breast anterior depth with stable associated dystrophic calcifications.  Biopsy clip at the 2:00 in the left breast near the lumpectomy site corresponding to previously biopsied benign fat necrosis.  Persistent round mass in the upper medial left breast with benign-appearing central round calcification.  No significant masses, calcifications, or other findings are seen in the breast  Impression: ultrasound is recommended for the left breast   03/09/2017 Breast US   Left breast ultrasound impression:  No significant change in oval hypoechoic masses in the left breast at 11:00 2 cm from the nipple and 10:30 4 cm from the nipple.  Both of these masses resemble the area of fat necrosis previously biopsied at 2:00.  In addition, both masses have developing central benign or dystrophic appearing calcifications and are favored to be other areas of fat necrosis.  A 34-month follow-up bilateral mammogram and left breast ultrasound is recommended.   09/14/2017 Breast US   Breast US Bilateral 09/14/17 at [facility]  IMPRESSION:  The 1 cm oval mass in the left breast at 9:30 posterior depth is highly suggestive of malignancy. An Ultrasound guided biopsy is recommended.  The 0.8 cm round mass in the left breast at 11-12 o'clock anterior depth is suspicious of malignancy. An ultrasound guided biopsy is recommended.    09/22/2017 Pathology Results   Diagnosis  1. Breast, right, needle core biopsy - INVASIVE DUCTAL CARCINOMA, MSBR GRADE II/III. - DUCTAL CARCINOMA IN SITU WITH NECROSIS. 2. Breast, left, needle core biopsy - FAT NECROSIS. - NO EVIDENCE OF MALIGNANCY.   10/17/2017 Genetic Testing   Negative genetic testing on the Multicancer panel.  The Multi-Gene Panel offered by [facility]  includes sequencing and/or deletion duplication testing of the following 83 genes: ALK, APC, ATM, AXIN2,BAP1,  BARD1, BLM, BMPR1A, BRCA1, BRCA2, BRIP1, CASR, CDC73, CDH1, CDK4, CDKN1B, CDKN1C, CDKN2A (p14ARF), CDKN2A (p16INK4a), CEBPA, CHEK2, CTNNA1, DICER1, DIS3L2, EGFR (c.2369C>T, p.Thr790Met variant only), EPCAM (Deletion/duplication testing only), FH, FLCN, GATA2, GPC3, GREM1 (Promoter region deletion/duplication testing only), HOXB13 (c.251G>A, p.Gly84Glu), HRAS, KIT, MAX, MEN1, MET, MITF (c.952G>A, p.Glu318Lys variant only), MLH1, MSH2, MSH3, MSH6, MUTYH, NBN, NF1, NF2, NTHL1, PALB2, PDGFRA, PHOX2B, PMS2, POLD1, POLE, POT1, PRKAR1A, PTCH1, PTEN, RAD50, RAD51C, RAD51D, RB1, RECQL4, RET, RUNX1, SDHAF2, SDHA (sequence changes only), SDHB, SDHC, SDHD, SMAD4, SMARCA4, SMARCB1, SMARCE1, STK11, SUFU, TERT, TERT, TMEM127, TP53, TSC1, TSC2, VHL, WRN and WT1.  The report date is October 17, 2017.   Breast cancer of upper-outer quadrant of right female breast (HCC) (Resolved)  09/14/2017 Mammogram   Diagnostic mammogram at [facility]  IMPRESSION:  The new 0.9 cm oval high density mass in the right breast posterior depth superior region seen on the mediolateral oblique view only is indeterminate. The 1.1 cm focal asymmetry in the left breast central to the nipple anterior depth is indeterminate.    09/14/2017 Imaging   [name] Bilateral at [facility] IMPRESSION: The 1 cm oval mass in the right breast at 9:30 posterior depth is highly suggestive of malignancy. The 0.8 cm round mass in the left breast at 11-12 o'clock anterior depth is suspicious of malignancy.    09/22/2017 Cancer Staging   Staging form: Breast, AJCC 8th Edition - Clinical stage from 09/22/2017: Stage IB (cT1b, cN0, cM0, G2, ER-, PR-, HER2-) - Signed by Malachy Mood, MD on 10/08/2017   09/22/2017 Pathology Results   Bilateral needle core biopsy 1. Breast, right, needle core biopsy - INVASIVE DUCTAL CARCINOMA, MSBR GRADE II/III. - DUCTAL CARCINOMA IN SITU WITH  NECROSIS. 2. Breast, left, needle core biopsy - FAT NECROSIS. - NO EVIDENCE OF MALIGNANCY.   09/25/2017 Receptors her2   Estrogen Receptor: 0 Progesterone 0 HER2: Negative. Ki-67: 80%    10/08/2017 Initial Diagnosis   Breast cancer of upper-outer quadrant of right female breast (HCC)   10/26/2017 Surgery   BREAST LUMPECTOMY WITH RADIOACTIVE SEED AND SENTINEL LYMPH NODE BIOPSY by Dr. Johna Sheriff  10/26/17   10/26/2017 Pathology Results   Diagnosis 10/26/17 1. Breast, lumpectomy, Right - INVASIVE DUCTAL CARCINOMA, GRADE 2, SPANNING 1 CM. - HIGH GRADE DUCTAL CARCINOMA IN SITU WITH NECROSIS. - FINAL RESECTION MARGINS (PARTS #2, 3, 4) ARE NEGATIVE. - BIOPSY SITE. - SEE ONCOLOGY TABLE. 2. Breast, excision, additional anterior margin- 2 pieces right - BENIGN BREAST TISSUE. 3. Breast, excision, Right unoriented anterior margin - BENIGN BREAST TISSUE. 4. Breast, excision, Right deep margin - BENIGN BREAST TISSUE. 5. Lymph node, sentinel, biopsy, Right Axillary - ONE OF ONE LYMPH NODES NEGATIVE FOR CARCINOMA (0/1).   10/26/2017 Cancer Staging   Staging form: Breast, AJCC 8th Edition - Pathologic stage from 10/26/2017: Stage IB (pT1b, pN0, cM0, G2, ER-, PR-, HER2-) - Signed by Malachy Mood, MD on 11/12/2017   12/03/2017 - 02/25/2018 Chemotherapy   Weekly Abraxane for 12 weeks 12/03/17-02/25/18   03/2018 - 04/19/2018 Radiation Therapy   With Dr. Basilio Cairo  CURRENT THERAPY: Surveillance   INTERVAL HISTORY Ms. Slaugh returns for follow up as scheduled. Last seen by me 04/23/22.   ROS   Past Medical History:  Diagnosis Date   Allergy    Atrial fibrillation (HCC)    Breast cancer (HCC) 08/25/12   Invasive ductal ca,DCIS   Diabetes mellitus    type II   Family history of cancer    Gout    Hearing loss    History of breast cancer    History of frequent urinary tract infections    History of radiation therapy 03/21/18- 04/19/18   Right Breast 2.67 Gy X 15 fractions, right breast boost 2  Gy X 5 fractions.    Hx of radiation therapy 11/22/12- 12/19/12   breast 4250 cGy 17 sessions, left breast boost 750 cGy 3 sessions   Hyperlipidemia    Hypertension    Osteopenia      Past Surgical History:  Procedure Laterality Date   ABDOMINAL HYSTERECTOMY Bilateral 1999   w/b/l salpingo-oopherectomy   APPENDECTOMY     BREAST LUMPECTOMY WITH NEEDLE LOCALIZATION AND AXILLARY SENTINEL LYMPH NODE BX Left 09/12/2012   Procedure: LEFT NEEDLE LOCALIZATION BREAST LUMPECTOMY TION AND AXILLARY SENTINEL LYMPH NODE BX;  Surgeon: Mariella Saa, MD;  Location: [facility] OR;  Service: General;  Laterality: Left;   BREAST LUMPECTOMY WITH RADIOACTIVE SEED AND SENTINEL LYMPH NODE BIOPSY Right 10/26/2017   Procedure: BREAST LUMPECTOMY WITH RADIOACTIVE SEED AND SENTINEL LYMPH NODE BIOPSY;  Surgeon: Glenna Fellows, MD;  Location: [facility];  Service: General;  Laterality: Right;   BREAST SURGERY  1990's   right- fibroid cyst   CHOLECYSTECTOMY     SEPTOPLASTY     TONSILLECTOMY       Outpatient Encounter Medications as of 11/17/2022  Medication Sig   Accu-Chek Softclix Lancets lancets Use to test up to 4 times daily.   allopurinol (ZYLOPRIM) 300 MG tablet TAKE 1 TABLET BY MOUTH EVERY DAY   apixaban (ELIQUIS) 2.5 MG TABS tablet Take 1 tablet (2.5 mg total) by mouth 2 (two) times daily.   apixaban (ELIQUIS) 2.5 MG TABS tablet TAKE 1 TABLET BY MOUTH TWICE A DAY   atenolol (TENORMIN) 25 MG tablet TAKE 1 TABLET BY MOUTH EVERY DAY   blood glucose meter kit and supplies KIT Dispense based on patient and insurance preference. Use up to four times daily as directed.   chlorpheniramine (CHLOR-TRIMETON) 4 MG tablet Take 4 mg 2 (two) times daily as needed by mouth for allergies.   diltiazem (CARDIZEM CD) 360 MG 24 hr capsule TAKE 1 CAPSULE BY MOUTH EVERY DAY   fish oil-omega-3 fatty acids 1000 MG capsule Take 1 g by mouth daily.   glipiZIDE (GLUCOTROL XL) 2.5 MG 24 hr tablet Take 1 tablet (2.5 mg  total) by mouth daily with breakfast.   glucose blood (ACCU-CHEK GUIDE) test strip TEST UP TO FOUR TIMES A DAY   metFORMIN (GLUCOPHAGE) 500 MG tablet Take 1 tablet (500 mg total) by mouth 2 (two) times daily with a meal. (Patient not taking: Reported on 10/06/2022)   Multiple Vitamin (MULTIVITAMIN) tablet Take 1 tablet by mouth daily.   OVER THE COUNTER MEDICATION Take by mouth 2 (two) times daily. Presavision- for macular degeneration prevention   rosuvastatin (CRESTOR) 5 MG tablet TAKE 1 TABLET (5 MG TOTAL) BY MOUTH DAILY.   VITAMIN D, ERGOCALCIFEROL, PO Take by mouth.   No facility-administered encounter medications on file as of 11/17/2022.     There were  no vitals filed for this visit. There is no height or weight on file to calculate BMI.   PHYSICAL EXAM GENERAL:alert, no distress and comfortable SKIN: no rash  EYES: sclera clear NECK: without mass LYMPH:  no palpable cervical or supraclavicular lymphadenopathy  LUNGS: clear with normal breathing effort HEART: regular rate & rhythm, no lower extremity edema ABDOMEN: abdomen soft, non-tender and normal bowel sounds NEURO: alert & oriented x 3 with fluent speech, no focal motor/sensory deficits Breast exam:  PAC without erythema    CBC    Component Value Date/Time   WBC 8.1 06/21/2022 1457   RBC 4.43 06/21/2022 1457   HGB 14.1 06/21/2022 1457   HGB 13.5 04/23/2022 0943   HGB 15.0 06/24/2017 1002   HGB 14.6 03/22/2017 0958   HCT 42.3 06/21/2022 1457   HCT 43.6 06/24/2017 1002   HCT 44.0 03/22/2017 0958   PLT 193 06/21/2022 1457   PLT 159 04/23/2022 0943   PLT 210 06/24/2017 1002   MCV 95.5 06/21/2022 1457   MCV 94 06/24/2017 1002   MCV 95.6 03/22/2017 0958   MCH 31.8 06/21/2022 1457   MCHC 33.3 06/21/2022 1457   RDW 13.4 06/21/2022 1457   RDW 14.0 06/24/2017 1002   RDW 14.5 03/22/2017 0958   LYMPHSABS 1.6 06/21/2022 1457   LYMPHSABS 1.4 03/22/2017 0958   MONOABS 1.0 06/21/2022 1457   MONOABS 0.8 03/22/2017 0958    EOSABS 0.1 06/21/2022 1457   EOSABS 0.1 03/22/2017 0958   BASOSABS 0.0 06/21/2022 1457   BASOSABS 0.1 03/22/2017 0958     CMP     Component Value Date/Time   NA 142 06/26/2022 0833   NA 143 06/24/2017 1002   NA 139 03/22/2017 0958   K 4.1 06/26/2022 0833   K 4.4 03/22/2017 0958   CL 103 06/26/2022 0833   CL 104 09/20/2012 1302   CO2 28 06/26/2022 0833   CO2 25 03/22/2017 0958   GLUCOSE 169 (H) 06/26/2022 0833   GLUCOSE 140 03/22/2017 0958   GLUCOSE 108 (H) 09/20/2012 1302   BUN 19 06/26/2022 0833   BUN 21 06/24/2017 1002   BUN 23.8 03/22/2017 0958   CREATININE 1.03 06/26/2022 0833   CREATININE 1.09 (H) 04/23/2022 0943   CREATININE 1.2 (H) 03/22/2017 0958   CALCIUM 10.1 06/26/2022 0833   CALCIUM 10.1 03/22/2017 0958   PROT 7.3 06/26/2022 0833   PROT 7.0 03/22/2017 0958   ALBUMIN 3.8 06/26/2022 0833   ALBUMIN 3.9 03/22/2017 0958   AST 26 06/26/2022 0833   AST 19 04/23/2022 0943   AST 19 03/22/2017 0958   ALT 30 06/26/2022 0833   ALT 16 04/23/2022 0943   ALT 24 03/22/2017 0958   ALKPHOS 92 06/26/2022 0833   ALKPHOS 84 03/22/2017 0958   BILITOT 0.5 06/26/2022 0833   BILITOT 0.6 04/23/2022 0943   BILITOT 0.44 03/22/2017 0958   GFRNONAA 50 (L) 06/21/2022 1457   GFRNONAA 50 (L) 04/23/2022 0943   GFRAA 48 (L) 10/30/2019 0858     ASSESSMENT & PLAN:87 y.o. [place], [place] woman    1. Breast cancer of upper outer quadrant of right breast, invasive ductal carcinoma, stage IB, pT1bN0M0, grade 2, Triple Negative, Ki67: 80% -Diagnosed in 09/2017, s/p right lumpectomy with SLNB, 12 weeks of adjuvant Abraxane, and adjuvant radiation  - genetic testing was negative -Last mammogram 12/08/21 showed right breast mass, biopsy showed fat necrosis, negative for malignancy.    2. pT1bN0, stage IA invasive ductal carcinoma of the left breast, grade  I, ER 100%, PR 88%, Ki-67 16%, HER-2/neu negative. -Diagnosed in 09/2012, s/p lumpectomy with SLNB 09/12/2012, adjuvant radiation, and adjuvant  antiestrogen therapy with exemestane from 12/2012-11/2017 -Abnormal mammogram on 10/2019, biopsy 10/25/2019 showed benign fat necrosis with calcifications   3. Osteopenia -She is on vitamin D supplement daily; she stopped calcium due to hypercalcemia -DEXA 09/2017  and 01/2020 showed osteopenia and very high risk of fracture, 29% for major osteoporotic fracture and 18% for hip fracture.  This has been stable over the last 2 years - Dr. Mosetta Putt and I both have previously discussed bisphosphonate vs Prolia, due to mild renal dysfunction prolia would be safer. She has been reluctant due to side effects.  -Repeat DEXA 12/2021 with lowest T score -2.2, and FRAX score of 30 % risk of major osteoporotic fracture and 20% risk of hip fracture in 10 years. This is very high risk -She ultimately agreed to start Prolia inj q6 months, prefers to do at PCP office which is closer to her. I will cc my note to Shirline Frees NP -Continue calcium, vit D and weight bearing exercise    4. Back pain, arthritis  -She has h/o left hip pain secondary to arthritis and bursitis.  -04/22/2021 she recently developed right mid-low back pain she attributes to sleeping incorrectly positioned. She had mild ttp on exam. This is likely MSK, secondary to rowing or positional.  -she has imaging that shows bilateral degenerative hip disease -also has sciatica which is stable -Continue exercise and stretching   5. Diabetes, HTN, CKD, Afib -She had elevated blood sugar when on steroids for hip pain, DC'd steroids  -Med adjustments per PCP -stable   6. Social Support -She lives at [facility] living and is very independent -Her children live close by her and she has a great grandson    PLAN:  No orders of the defined types were placed in this encounter.     All questions were answered. The patient knows to call the clinic with any problems, questions or concerns. No barriers to learning were detected. I spent ***  counseling the patient face to face. The total time spent in the appointment was *** and more than 50% was on counseling, review of test results, and coordination of care.   Santiago Glad, NP-C @DATE @

## 2022-11-17 ENCOUNTER — Inpatient Hospital Stay: Payer: Medicare PPO | Attending: Nurse Practitioner

## 2022-11-17 ENCOUNTER — Other Ambulatory Visit: Payer: Self-pay

## 2022-11-17 ENCOUNTER — Inpatient Hospital Stay: Payer: Medicare PPO | Admitting: Nurse Practitioner

## 2022-11-17 ENCOUNTER — Encounter: Payer: Self-pay | Admitting: Nurse Practitioner

## 2022-11-17 VITALS — BP 151/86 | HR 87 | Temp 98.2°F | Resp 18 | Wt 120.3 lb

## 2022-11-17 DIAGNOSIS — Z171 Estrogen receptor negative status [ER-]: Secondary | ICD-10-CM

## 2022-11-17 DIAGNOSIS — N189 Chronic kidney disease, unspecified: Secondary | ICD-10-CM | POA: Diagnosis not present

## 2022-11-17 DIAGNOSIS — I129 Hypertensive chronic kidney disease with stage 1 through stage 4 chronic kidney disease, or unspecified chronic kidney disease: Secondary | ICD-10-CM | POA: Diagnosis not present

## 2022-11-17 DIAGNOSIS — Z79899 Other long term (current) drug therapy: Secondary | ICD-10-CM | POA: Insufficient documentation

## 2022-11-17 DIAGNOSIS — Z853 Personal history of malignant neoplasm of breast: Secondary | ICD-10-CM | POA: Diagnosis not present

## 2022-11-17 DIAGNOSIS — M8580 Other specified disorders of bone density and structure, unspecified site: Secondary | ICD-10-CM | POA: Diagnosis not present

## 2022-11-17 DIAGNOSIS — C50412 Malignant neoplasm of upper-outer quadrant of left female breast: Secondary | ICD-10-CM

## 2022-11-17 DIAGNOSIS — Z923 Personal history of irradiation: Secondary | ICD-10-CM | POA: Diagnosis not present

## 2022-11-17 DIAGNOSIS — Z170 Estrogen receptor positive status [ER+]: Secondary | ICD-10-CM

## 2022-11-17 DIAGNOSIS — C50411 Malignant neoplasm of upper-outer quadrant of right female breast: Secondary | ICD-10-CM | POA: Diagnosis not present

## 2022-11-17 DIAGNOSIS — E1122 Type 2 diabetes mellitus with diabetic chronic kidney disease: Secondary | ICD-10-CM | POA: Insufficient documentation

## 2022-11-17 LAB — CBC WITH DIFFERENTIAL (CANCER CENTER ONLY)
Abs Immature Granulocytes: 0.03 10*3/uL (ref 0.00–0.07)
Basophils Absolute: 0 10*3/uL (ref 0.0–0.1)
Basophils Relative: 1 %
Eosinophils Absolute: 0 10*3/uL (ref 0.0–0.5)
Eosinophils Relative: 0 %
HCT: 43.4 % (ref 36.0–46.0)
Hemoglobin: 14.5 g/dL (ref 12.0–15.0)
Immature Granulocytes: 0 %
Lymphocytes Relative: 14 %
Lymphs Abs: 1.2 10*3/uL (ref 0.7–4.0)
MCH: 32.7 pg (ref 26.0–34.0)
MCHC: 33.4 g/dL (ref 30.0–36.0)
MCV: 97.7 fL (ref 80.0–100.0)
Monocytes Absolute: 1 10*3/uL (ref 0.1–1.0)
Monocytes Relative: 12 %
Neutro Abs: 6.1 10*3/uL (ref 1.7–7.7)
Neutrophils Relative %: 73 %
Platelet Count: 166 10*3/uL (ref 150–400)
RBC: 4.44 MIL/uL (ref 3.87–5.11)
RDW: 13.8 % (ref 11.5–15.5)
WBC Count: 8.4 10*3/uL (ref 4.0–10.5)
nRBC: 0 % (ref 0.0–0.2)

## 2022-11-17 LAB — CMP (CANCER CENTER ONLY)
ALT: 17 U/L (ref 0–44)
AST: 22 U/L (ref 15–41)
Albumin: 4.2 g/dL (ref 3.5–5.0)
Alkaline Phosphatase: 103 U/L (ref 38–126)
Anion gap: 11 (ref 5–15)
BUN: 23 mg/dL (ref 8–23)
CO2: 25 mmol/L (ref 22–32)
Calcium: 9.8 mg/dL (ref 8.9–10.3)
Chloride: 106 mmol/L (ref 98–111)
Creatinine: 1.14 mg/dL — ABNORMAL HIGH (ref 0.44–1.00)
GFR, Estimated: 47 mL/min — ABNORMAL LOW (ref >60)
Glucose, Bld: 172 mg/dL — ABNORMAL HIGH (ref 70–99)
Potassium: 3.6 mmol/L (ref 3.5–5.1)
Sodium: 142 mmol/L (ref 135–145)
Total Bilirubin: 0.6 mg/dL (ref 0.3–1.2)
Total Protein: 7.2 g/dL (ref 6.5–8.1)

## 2022-11-18 DIAGNOSIS — M6281 Muscle weakness (generalized): Secondary | ICD-10-CM | POA: Diagnosis not present

## 2022-11-18 DIAGNOSIS — M5459 Other low back pain: Secondary | ICD-10-CM | POA: Diagnosis not present

## 2022-11-18 DIAGNOSIS — M25561 Pain in right knee: Secondary | ICD-10-CM | POA: Diagnosis not present

## 2022-11-20 DIAGNOSIS — M5459 Other low back pain: Secondary | ICD-10-CM | POA: Diagnosis not present

## 2022-11-20 DIAGNOSIS — M25561 Pain in right knee: Secondary | ICD-10-CM | POA: Diagnosis not present

## 2022-11-20 DIAGNOSIS — M6281 Muscle weakness (generalized): Secondary | ICD-10-CM | POA: Diagnosis not present

## 2022-11-23 DIAGNOSIS — M6281 Muscle weakness (generalized): Secondary | ICD-10-CM | POA: Diagnosis not present

## 2022-11-23 DIAGNOSIS — M25561 Pain in right knee: Secondary | ICD-10-CM | POA: Diagnosis not present

## 2022-11-23 DIAGNOSIS — M5459 Other low back pain: Secondary | ICD-10-CM | POA: Diagnosis not present

## 2022-11-25 DIAGNOSIS — M5459 Other low back pain: Secondary | ICD-10-CM | POA: Diagnosis not present

## 2022-11-25 DIAGNOSIS — M25561 Pain in right knee: Secondary | ICD-10-CM | POA: Diagnosis not present

## 2022-11-25 DIAGNOSIS — M6281 Muscle weakness (generalized): Secondary | ICD-10-CM | POA: Diagnosis not present

## 2022-12-02 DIAGNOSIS — M5459 Other low back pain: Secondary | ICD-10-CM | POA: Diagnosis not present

## 2022-12-02 DIAGNOSIS — M6281 Muscle weakness (generalized): Secondary | ICD-10-CM | POA: Diagnosis not present

## 2022-12-02 DIAGNOSIS — M25561 Pain in right knee: Secondary | ICD-10-CM | POA: Diagnosis not present

## 2022-12-04 DIAGNOSIS — M25561 Pain in right knee: Secondary | ICD-10-CM | POA: Diagnosis not present

## 2022-12-04 DIAGNOSIS — M6281 Muscle weakness (generalized): Secondary | ICD-10-CM | POA: Diagnosis not present

## 2022-12-04 DIAGNOSIS — M5459 Other low back pain: Secondary | ICD-10-CM | POA: Diagnosis not present

## 2022-12-07 DIAGNOSIS — M25561 Pain in right knee: Secondary | ICD-10-CM | POA: Diagnosis not present

## 2022-12-07 DIAGNOSIS — M5459 Other low back pain: Secondary | ICD-10-CM | POA: Diagnosis not present

## 2022-12-07 DIAGNOSIS — M6281 Muscle weakness (generalized): Secondary | ICD-10-CM | POA: Diagnosis not present

## 2022-12-14 DIAGNOSIS — Z1231 Encounter for screening mammogram for malignant neoplasm of breast: Secondary | ICD-10-CM | POA: Diagnosis not present

## 2022-12-16 ENCOUNTER — Encounter: Payer: Self-pay | Admitting: Hematology

## 2022-12-25 ENCOUNTER — Other Ambulatory Visit: Payer: Self-pay | Admitting: Adult Health

## 2023-01-05 DIAGNOSIS — L570 Actinic keratosis: Secondary | ICD-10-CM | POA: Diagnosis not present

## 2023-01-05 DIAGNOSIS — D1801 Hemangioma of skin and subcutaneous tissue: Secondary | ICD-10-CM | POA: Diagnosis not present

## 2023-01-05 DIAGNOSIS — D2271 Melanocytic nevi of right lower limb, including hip: Secondary | ICD-10-CM | POA: Diagnosis not present

## 2023-01-05 DIAGNOSIS — Z85828 Personal history of other malignant neoplasm of skin: Secondary | ICD-10-CM | POA: Diagnosis not present

## 2023-01-05 DIAGNOSIS — L821 Other seborrheic keratosis: Secondary | ICD-10-CM | POA: Diagnosis not present

## 2023-01-05 DIAGNOSIS — L814 Other melanin hyperpigmentation: Secondary | ICD-10-CM | POA: Diagnosis not present

## 2023-01-05 DIAGNOSIS — D485 Neoplasm of uncertain behavior of skin: Secondary | ICD-10-CM | POA: Diagnosis not present

## 2023-01-05 DIAGNOSIS — D229 Melanocytic nevi, unspecified: Secondary | ICD-10-CM | POA: Diagnosis not present

## 2023-01-05 DIAGNOSIS — L578 Other skin changes due to chronic exposure to nonionizing radiation: Secondary | ICD-10-CM | POA: Diagnosis not present

## 2023-01-05 DIAGNOSIS — D0439 Carcinoma in situ of skin of other parts of face: Secondary | ICD-10-CM | POA: Diagnosis not present

## 2023-01-07 ENCOUNTER — Ambulatory Visit: Payer: Medicare PPO | Admitting: Adult Health

## 2023-01-07 ENCOUNTER — Encounter: Payer: Self-pay | Admitting: Adult Health

## 2023-01-07 VITALS — BP 150/90 | HR 45 | Temp 97.7°F | Ht 61.0 in | Wt 125.0 lb

## 2023-01-07 DIAGNOSIS — E119 Type 2 diabetes mellitus without complications: Secondary | ICD-10-CM

## 2023-01-07 DIAGNOSIS — I10 Essential (primary) hypertension: Secondary | ICD-10-CM | POA: Diagnosis not present

## 2023-01-07 DIAGNOSIS — Z7984 Long term (current) use of oral hypoglycemic drugs: Secondary | ICD-10-CM

## 2023-01-07 LAB — POCT GLYCOSYLATED HEMOGLOBIN (HGB A1C): Hemoglobin A1C: 6.8 % — AB (ref 4.0–5.6)

## 2023-01-07 NOTE — Progress Notes (Signed)
Subjective:    Patient ID: Adrienne [name], female    DOB: Apr 11, 1935, 88 y.o.   MRN: [id number]  HPI 88 year old female who  has a past medical history of Allergy, Atrial fibrillation (HCC), Breast cancer (HCC) (08/25/12), Diabetes mellitus, Family history of cancer, Gout, Hearing loss, History of breast cancer, History of frequent urinary tract infections, History of radiation therapy (03/21/18- 04/19/18), radiation therapy (11/22/12- 12/19/12), Hyperlipidemia, Hypertension, and Osteopenia.  She is being evaluated today for follow up regarding Dm and HTN   Diabetes mellitus type II-managed with  glipizide 2.5 mg XR daily.   She denies episodes of hypoglycemia.  She does try and stay active with yoga and cardio classes multiple times a week and then does weight training 7 days a week.  During her last visit Metformin was d/c due to A1c being 6.3  Lab Results  Component Value Date   HGBA1C 6.3 (A) 10/06/2022   HTN -managed with Cardizem 360 mg daily and atenolol 5 mg daily.  She denies dizziness, lightheadedness, chest pain, or shortness of breath. She has not taken her blood pressure medication this morning.  BP Readings from Last 3 Encounters:  01/07/23 (!) 150/90  11/17/22 (!) 151/86  10/06/22 120/82    Review of Systems See HPI   Past Medical History:  Diagnosis Date   Allergy    Atrial fibrillation (HCC)    Breast cancer (HCC) 08/25/12   Invasive ductal ca,DCIS   Diabetes mellitus    type II   Family history of cancer    Gout    Hearing loss    History of breast cancer    History of frequent urinary tract infections    History of radiation therapy 03/21/18- 04/19/18   Right Breast 2.67 Gy X 15 fractions, right breast boost 2 Gy X 5 fractions.    Hx of radiation therapy 11/22/12- 12/19/12   breast 4250 cGy 17 sessions, left breast boost 750 cGy 3 sessions   Hyperlipidemia    Hypertension    Osteopenia     Social History   Socioeconomic History   Marital status:  Married    Spouse name: Not on file   Number of children: 3   Years of education: Not on file   Highest education level: Not on file  Occupational History   Not on file  Tobacco Use   Smoking status: Former    Current packs/day: 0.25    Average packs/day: 0.3 packs/day for 4.0 years (1.0 ttl pk-yrs)    Types: Cigarettes   Smokeless tobacco: Never   Tobacco comments:    Cigarette use was 50 years ago  Vaping Use   Vaping status: Never Used  Substance and Sexual Activity   Alcohol use: Not Currently   Drug use: No   Sexual activity: Not Currently    Comment: menarche age 63, 1st birth age 65, G43, HRT x 7 years?  Other Topics Concern   Not on file  Social History Narrative   Married - husband in memory care unit    Three children ( One lives at [place] and two in [place]      She likes to read and go to [employer]    Social Determinants of Health   Financial Resource Strain: Low Risk  (07/15/2022)   Overall Financial Resource Strain (CARDIA)    Difficulty of Paying Living Expenses: Not hard at all  Food Insecurity: No Food Insecurity (07/15/2022)   Hunger Vital Sign  Worried About Programme researcher, broadcasting/film/video in the Last Year: Never true    Ran Out of Food in the Last Year: Never true  Transportation Needs: No Transportation Needs (07/15/2022)   PRAPARE - Administrator, Civil Service (Medical): No    Lack of Transportation (Non-Medical): No  Physical Activity: Sufficiently Active (03/31/2022)   Exercise Vital Sign    Days of Exercise per Week: 7 days    Minutes of Exercise per Session: 90 min  Stress: No Stress Concern Present (03/31/2022)   [employer] of Occupational Health - Occupational Stress Questionnaire    Feeling of Stress : Not at all  Social Connections: Moderately Integrated (03/31/2022)   Social Connection and Isolation Panel [NHANES]    Frequency of Communication with Friends and Family: More than three times a week    Frequency of Social  Gatherings with Friends and Family: More than three times a week    Attends Religious Services: More than 4 times per year    Active Member of [employer] or Organizations: Yes    Attends Banker Meetings: More than 4 times per year    Marital Status: Widowed  Intimate Partner Violence: Not At Risk (03/31/2022)   Humiliation, Afraid, Rape, and Kick questionnaire    Fear of Current or Ex-Partner: No    Emotionally Abused: No    Physically Abused: No    Sexually Abused: No    Past Surgical History:  Procedure Laterality Date   ABDOMINAL HYSTERECTOMY Bilateral 1999   w/b/l salpingo-oopherectomy   APPENDECTOMY     BREAST LUMPECTOMY WITH NEEDLE LOCALIZATION AND AXILLARY SENTINEL LYMPH NODE BX Left 09/12/2012   Procedure: LEFT NEEDLE LOCALIZATION BREAST LUMPECTOMY TION AND AXILLARY SENTINEL LYMPH NODE BX;  Surgeon: Mariella Saa, MD;  Location: [facility];  Service: General;  Laterality: Left;   BREAST LUMPECTOMY WITH RADIOACTIVE SEED AND SENTINEL LYMPH NODE BIOPSY Right 10/26/2017   Procedure: BREAST LUMPECTOMY WITH RADIOACTIVE SEED AND SENTINEL LYMPH NODE BIOPSY;  Surgeon: Glenna Fellows, MD;  Location: [facility];  Service: General;  Laterality: Right;   BREAST SURGERY  1990's   right- fibroid cyst   CHOLECYSTECTOMY     SEPTOPLASTY     TONSILLECTOMY      Family History  Problem Relation Age of Onset   Hypertension Mother    Heart disease Mother        Murmur and irregular heart disease   Dementia Mother        d. 85   Hypertension Father    Aneurysm Father 71   Breast cancer Cousin 72       mat first cousin    Allergies  Allergen Reactions   Penicillins Hives    Has patient had a PCN reaction causing immediate rash, facial/tongue/throat swelling, SOB or lightheadedness with hypotension: No Has patient had a PCN reaction causing severe rash involving mucus membranes or skin necrosis: No Has patient had a PCN reaction that required hospitalization:  No Has patient had a PCN reaction occurring within the last 10 years: No If all of the above answers are "NO", then may proceed with Cephalosporin use.   Other Hives   Prednisone Other (See Comments)    ELEVATED BLOOD SUGAR   Radiaplexrx [Wound Dressings] Hives   Sulfamethoxazole Nausea Only    Current Outpatient Medications on File Prior to Visit  Medication Sig Dispense Refill   Accu-Chek Softclix Lancets lancets Use to test up to 4 times daily. 100  each 12   allopurinol (ZYLOPRIM) 300 MG tablet TAKE 1 TABLET BY MOUTH EVERY DAY 90 tablet 3   apixaban (ELIQUIS) 2.5 MG TABS tablet Take 1 tablet (2.5 mg total) by mouth 2 (two) times daily. 56 tablet 0   apixaban (ELIQUIS) 2.5 MG TABS tablet TAKE 1 TABLET BY MOUTH TWICE A DAY 60 tablet 5   atenolol (TENORMIN) 25 MG tablet TAKE 1 TABLET BY MOUTH EVERY DAY 90 tablet 3   blood glucose meter kit and supplies KIT Dispense based on patient and insurance preference. Use up to four times daily as directed. 1 each 0   chlorpheniramine (CHLOR-TRIMETON) 4 MG tablet Take 4 mg 2 (two) times daily as needed by mouth for allergies.     diltiazem (CARDIZEM CD) 360 MG 24 hr capsule TAKE 1 CAPSULE BY MOUTH EVERY DAY 90 capsule 3   fish oil-omega-3 fatty acids 1000 MG capsule Take 1 g by mouth daily.     glipiZIDE (GLUCOTROL XL) 2.5 MG 24 hr tablet TAKE 1 TABLET BY MOUTH DAILY WITH BREAKFAST. 90 tablet 1   glucose blood (ACCU-CHEK GUIDE) test strip TEST UP TO FOUR TIMES A DAY 100 strip 3   metFORMIN (GLUCOPHAGE) 500 MG tablet Take 1 tablet (500 mg total) by mouth 2 (two) times daily with a meal. 180 tablet 3   Multiple Vitamin (MULTIVITAMIN) tablet Take 1 tablet by mouth daily.     OVER THE COUNTER MEDICATION Take by mouth 2 (two) times daily. Presavision- for macular degeneration prevention     rosuvastatin (CRESTOR) 5 MG tablet TAKE 1 TABLET (5 MG TOTAL) BY MOUTH DAILY. 90 tablet 3   VITAMIN D, ERGOCALCIFEROL, PO Take by mouth.     No current  facility-administered medications on file prior to visit.    BP (!) 150/90   Pulse (!) 45   Temp 97.7 F (36.5 C) (Oral)   Ht 5\' 1"  (1.549 m)   Wt 125 lb (56.7 kg)   SpO2 95%   BMI 23.62 kg/m       Objective:   Physical Exam Vitals and nursing note reviewed.  Constitutional:      Appearance: Normal appearance.  Cardiovascular:     Rate and Rhythm: Normal rate and regular rhythm.     Pulses: Normal pulses.     Heart sounds: Normal heart sounds.  Pulmonary:     Effort: Pulmonary effort is normal.     Breath sounds: Normal breath sounds.  Musculoskeletal:        General: Normal range of motion.  Skin:    General: Skin is warm and dry.  Neurological:     General: No focal deficit present.     Mental Status: She is alert and oriented to person, place, and time.  Psychiatric:        Mood and Affect: Mood normal.        Behavior: Behavior normal.        Thought Content: Thought content normal.        Judgment: Judgment normal.       Assessment & Plan:  1. Diabetes mellitus treated with oral medication (HCC)  - POC HgB A1c- 6.8  - At goal - Continue Glipzide 2.5 mg ER - Follow up in 6 months for CPE   2. Essential hypertension - elevated today because she did not take her medication   Shirline Frees, NP

## 2023-01-07 NOTE — Patient Instructions (Addendum)
Your A1c was 6.8 - continue with Glipizide 2.5 mg ER  You are do for your physical exam after 06/26/23

## 2023-01-15 ENCOUNTER — Ambulatory Visit: Payer: Medicare PPO | Admitting: Family Medicine

## 2023-01-15 ENCOUNTER — Encounter: Payer: Self-pay | Admitting: Family Medicine

## 2023-01-15 VITALS — BP 136/84 | HR 71 | Temp 98.2°F | Wt 125.2 lb

## 2023-01-15 DIAGNOSIS — H00012 Hordeolum externum right lower eyelid: Secondary | ICD-10-CM | POA: Diagnosis not present

## 2023-01-15 MED ORDER — ERYTHROMYCIN 5 MG/GM OP OINT: 1.0000 | TOPICAL_OINTMENT | Freq: Three times a day (TID) | OPHTHALMIC | 1 refills | Status: DC

## 2023-01-15 NOTE — Progress Notes (Signed)
Subjective:    Patient ID: Adrienne [name], female    DOB: November 21, 1934, 88 y.o.   MRN: [id number]  HPI Here for 2 days of a tender lump on the right lower eyelid. Using warm compresses.    Review of Systems  Constitutional: Negative.   HENT: Negative.    Eyes:  Negative for photophobia, pain, discharge and redness.  Respiratory: Negative.         Objective:   Physical Exam Constitutional:      Appearance: Normal appearance.  Eyes:     Conjunctiva/sclera: Conjunctivae normal.     Pupils: Pupils are equal, round, and reactive to light.     Comments: There is a small stye on the right lower lid  Cardiovascular:     Rate and Rhythm: Normal rate. Rhythm irregular.     Pulses: Normal pulses.     Heart sounds: Normal heart sounds.  Pulmonary:     Effort: Pulmonary effort is normal.     Breath sounds: Normal breath sounds.  Neurological:     Mental Status: She is alert.           Assessment & Plan:  Stye, treat with Erythromycin ointment TID.  Gershon Crane, MD

## 2023-01-26 ENCOUNTER — Encounter: Payer: Self-pay | Admitting: Family Medicine

## 2023-01-26 ENCOUNTER — Ambulatory Visit: Payer: Medicare PPO | Admitting: Family Medicine

## 2023-01-26 VITALS — BP 128/70 | HR 53 | Temp 98.3°F | Wt 126.8 lb

## 2023-01-26 DIAGNOSIS — H00012 Hordeolum externum right lower eyelid: Secondary | ICD-10-CM

## 2023-01-26 NOTE — Progress Notes (Signed)
Subjective:    Patient ID: Adrienne [name], female    DOB: [birth year], 88 y.o.   MRN: [id number]  HPI Here to follow up on a stye on the right lower eyelid that came up 2 weeks ago. She was here last week, and she started using warm compresses and applying Erythromycin ophthalmic ointment. Now the tenderness has gone away but the stye has not reduced in size at all.   Review of Systems  Constitutional: Negative.   Respiratory: Negative.    Cardiovascular: Negative.        Objective:   Physical Exam Constitutional:      General: She is not in acute distress.    Appearance: Normal appearance.  Eyes:     Comments: There is a non-tender stye on the right lower eyelid  Cardiovascular:     Rate and Rhythm: Normal rate and regular rhythm.     Pulses: Normal pulses.     Heart sounds: Normal heart sounds.  Pulmonary:     Effort: Pulmonary effort is normal.     Breath sounds: Normal breath sounds.  Neurological:     Mental Status: She is alert.           Assessment & Plan:  Stye, we will refer her to Ophthalmology to consider excision of this lesion.  Gershon Crane, MD

## 2023-02-03 ENCOUNTER — Ambulatory Visit: Payer: Medicare PPO | Admitting: Adult Health

## 2023-02-03 VITALS — BP 128/74 | HR 82 | Temp 97.6°F | Wt 126.0 lb

## 2023-02-03 DIAGNOSIS — H00012 Hordeolum externum right lower eyelid: Secondary | ICD-10-CM | POA: Diagnosis not present

## 2023-02-03 NOTE — Patient Instructions (Signed)
Please call Dr. Elmer Picker at [phone] for the stye

## 2023-02-03 NOTE — Progress Notes (Signed)
Subjective:    Patient ID: Adrienne [name], female    DOB: Dec 30, 1934, 88 y.o.   MRN: [id number]  HPI She was seen urgently about 2.5 weeks ago by another provider in the office via the right lower lid.  She was treated with erythromycin ointment 3 times daily.  She was also advised to use warm compresses and eyelid massage.  He was then for follow-up roughly 10 days later at which time the tenderness had improved but the stye had not reduce in size at all.  She was referred to ophthalmology for incision and drainage.  Today she reports that she continues to use warm compress and eyelid massage but she has not heard from ophthalmology just yet.  She does feel as though the stye has gotten smaller in size but is still present.   Review of Systems See HPI   Past Medical History:  Diagnosis Date   Allergy    Atrial fibrillation (HCC)    Breast cancer (HCC) 08/25/12   Invasive ductal ca,DCIS   Diabetes mellitus    type II   Family history of cancer    Gout    Hearing loss    History of breast cancer    History of frequent urinary tract infections    History of radiation therapy 03/21/18- 04/19/18   Right Breast 2.67 Gy X 15 fractions, right breast boost 2 Gy X 5 fractions.    Hx of radiation therapy 11/22/12- 12/19/12   breast 4250 cGy 17 sessions, left breast boost 750 cGy 3 sessions   Hyperlipidemia    Hypertension    Osteopenia     Social History   Socioeconomic History   Marital status: Married    Spouse name: Not on file   Number of children: 3   Years of education: Not on file   Highest education level: Not on file  Occupational History   Not on file  Tobacco Use   Smoking status: Former    Current packs/day: 0.25    Average packs/day: 0.3 packs/day for 4.0 years (1.0 ttl pk-yrs)    Types: Cigarettes   Smokeless tobacco: Never   Tobacco comments:    Cigarette use was 50 years ago  Vaping Use   Vaping status: Never Used  Substance and Sexual Activity   Alcohol  use: Not Currently   Drug use: No   Sexual activity: Not Currently    Comment: menarche age 39, 1st birth age 49, G76, HRT x 7 years?  Other Topics Concern   Not on file  Social History Narrative   Married - husband in memory care unit    Three children ( One lives at [place] and two in [place]      She likes to read and go to [employer]    Social Determinants of Health   Financial Resource Strain: Low Risk  (07/15/2022)   Overall Financial Resource Strain (CARDIA)    Difficulty of Paying Living Expenses: Not hard at all  Food Insecurity: No Food Insecurity (07/15/2022)   Hunger Vital Sign    Worried About Running Out of Food in the Last Year: Never true    Ran Out of Food in the Last Year: Never true  Transportation Needs: No Transportation Needs (07/15/2022)   PRAPARE - Administrator, Civil Service (Medical): No    Lack of Transportation (Non-Medical): No  Physical Activity: Sufficiently Active (03/31/2022)   Exercise Vital Sign    Days of Exercise per Week:  7 days    Minutes of Exercise per Session: 90 min  Stress: No Stress Concern Present (03/31/2022)   [employer] of Occupational Health - Occupational Stress Questionnaire    Feeling of Stress : Not at all  Social Connections: Moderately Integrated (03/31/2022)   Social Connection and Isolation Panel [NHANES]    Frequency of Communication with Friends and Family: More than three times a week    Frequency of Social Gatherings with Friends and Family: More than three times a week    Attends Religious Services: More than 4 times per year    Active Member of [employer] or Organizations: Yes    Attends Banker Meetings: More than 4 times per year    Marital Status: Widowed  Intimate Partner Violence: Not At Risk (03/31/2022)   Humiliation, Afraid, Rape, and Kick questionnaire    Fear of Current or Ex-Partner: No    Emotionally Abused: No    Physically Abused: No    Sexually Abused: No    Past  Surgical History:  Procedure Laterality Date   ABDOMINAL HYSTERECTOMY Bilateral 1999   w/b/l salpingo-oopherectomy   APPENDECTOMY     BREAST LUMPECTOMY WITH NEEDLE LOCALIZATION AND AXILLARY SENTINEL LYMPH NODE BX Left 09/12/2012   Procedure: LEFT NEEDLE LOCALIZATION BREAST LUMPECTOMY TION AND AXILLARY SENTINEL LYMPH NODE BX;  Surgeon: Mariella Saa, MD;  Location: [facility];  Service: General;  Laterality: Left;   BREAST LUMPECTOMY WITH RADIOACTIVE SEED AND SENTINEL LYMPH NODE BIOPSY Right 10/26/2017   Procedure: BREAST LUMPECTOMY WITH RADIOACTIVE SEED AND SENTINEL LYMPH NODE BIOPSY;  Surgeon: Glenna Fellows, MD;  Location: [facility];  Service: General;  Laterality: Right;   BREAST SURGERY  1990's   right- fibroid cyst   CHOLECYSTECTOMY     SEPTOPLASTY     TONSILLECTOMY      Family History  Problem Relation Age of Onset   Hypertension Mother    Heart disease Mother        Murmur and irregular heart disease   Dementia Mother        d. 68   Hypertension Father    Aneurysm Father 78   Breast cancer Cousin 72       mat first cousin    Allergies  Allergen Reactions   Penicillins Hives    Has patient had a PCN reaction causing immediate rash, facial/tongue/throat swelling, SOB or lightheadedness with hypotension: No Has patient had a PCN reaction causing severe rash involving mucus membranes or skin necrosis: No Has patient had a PCN reaction that required hospitalization: No Has patient had a PCN reaction occurring within the last 10 years: No If all of the above answers are "NO", then may proceed with Cephalosporin use.   Other Hives   Prednisone Other (See Comments)    ELEVATED BLOOD SUGAR   Radiaplexrx [Wound Dressings] Hives   Sulfamethoxazole Nausea Only    Current Outpatient Medications on File Prior to Visit  Medication Sig Dispense Refill   Accu-Chek Softclix Lancets lancets Use to test up to 4 times daily. 100 each 12   allopurinol (ZYLOPRIM) 300  MG tablet TAKE 1 TABLET BY MOUTH EVERY DAY 90 tablet 3   apixaban (ELIQUIS) 2.5 MG TABS tablet Take 1 tablet (2.5 mg total) by mouth 2 (two) times daily. 56 tablet 0   apixaban (ELIQUIS) 2.5 MG TABS tablet TAKE 1 TABLET BY MOUTH TWICE A DAY 60 tablet 5   atenolol (TENORMIN) 25 MG tablet TAKE 1 TABLET BY  MOUTH EVERY DAY 90 tablet 3   blood glucose meter kit and supplies KIT Dispense based on patient and insurance preference. Use up to four times daily as directed. 1 each 0   chlorpheniramine (CHLOR-TRIMETON) 4 MG tablet Take 4 mg 2 (two) times daily as needed by mouth for allergies.     diltiazem (CARDIZEM CD) 360 MG 24 hr capsule TAKE 1 CAPSULE BY MOUTH EVERY DAY 90 capsule 3   erythromycin ophthalmic ointment Place 1 Application into the right eye 3 (three) times daily. 1 g 1   fish oil-omega-3 fatty acids 1000 MG capsule Take 1 g by mouth daily.     glipiZIDE (GLUCOTROL XL) 2.5 MG 24 hr tablet TAKE 1 TABLET BY MOUTH DAILY WITH BREAKFAST. 90 tablet 1   glucose blood (ACCU-CHEK GUIDE) test strip TEST UP TO FOUR TIMES A DAY 100 strip 3   metFORMIN (GLUCOPHAGE) 500 MG tablet Take 1 tablet (500 mg total) by mouth 2 (two) times daily with a meal. 180 tablet 3   Multiple Vitamin (MULTIVITAMIN) tablet Take 1 tablet by mouth daily.     OVER THE COUNTER MEDICATION Take by mouth 2 (two) times daily. Presavision- for macular degeneration prevention     rosuvastatin (CRESTOR) 5 MG tablet TAKE 1 TABLET (5 MG TOTAL) BY MOUTH DAILY. 90 tablet 3   VITAMIN D, ERGOCALCIFEROL, PO Take by mouth.     No current facility-administered medications on file prior to visit.    BP 128/74   Pulse 82   Temp (!) 97 F (36.1 C)   Wt 126 lb (57.2 kg)   BMI 23.81 kg/m       Objective:   Physical Exam Vitals and nursing note reviewed.  Constitutional:      Appearance: Normal appearance.  Skin:    General: Skin is warm and dry.     Comments: Stye on right lower lid   Neurological:     Mental Status: She is  alert.  Psychiatric:        Mood and Affect: Mood normal.        Behavior: Behavior normal.        Thought Content: Thought content normal.        Judgment: Judgment normal.        Assessment & Plan:  1. Hordeolum externum of right lower eyelid -Referral was sent to [facility].  This phone number was provided to the patient, she will call and schedule her appointment.  Urged to continue to use warm compresses and lid massage.  Shirline Frees, NP

## 2023-02-24 DIAGNOSIS — C44329 Squamous cell carcinoma of skin of other parts of face: Secondary | ICD-10-CM | POA: Diagnosis not present

## 2023-03-02 ENCOUNTER — Encounter: Payer: Self-pay | Admitting: Family Medicine

## 2023-03-02 ENCOUNTER — Ambulatory Visit: Payer: Medicare PPO | Admitting: Family Medicine

## 2023-03-02 VITALS — BP 128/76 | HR 71 | Temp 97.9°F | Wt 124.0 lb

## 2023-03-02 DIAGNOSIS — N390 Urinary tract infection, site not specified: Secondary | ICD-10-CM

## 2023-03-02 MED ORDER — CIPROFLOXACIN HCL 500 MG PO TABS: 500.0000 mg | ORAL_TABLET | Freq: Two times a day (BID) | ORAL | 0 refills | Status: DC

## 2023-03-02 NOTE — Progress Notes (Signed)
Subjective:    Patient ID: Adrienne [name], female    DOB: [birth year], 88 y.o.   MRN: [id number]  HPI Here for 4 days of urinary urgency and burning. No fever or nausea or back pain. She is drinking lots of water. Unfortunately she was not able to give [name] a urine sample today.    Review of Systems  Constitutional: Negative.   Respiratory: Negative.    Cardiovascular: Negative.   Gastrointestinal: Negative.   Genitourinary:  Positive for dysuria, frequency and urgency. Negative for flank pain and hematuria.       Objective:   Physical Exam Constitutional:      Appearance: Normal appearance. She is not ill-appearing.  Cardiovascular:     Rate and Rhythm: Normal rate. Rhythm irregular.     Pulses: Normal pulses.     Heart sounds: Normal heart sounds.  Pulmonary:     Effort: Pulmonary effort is normal.     Breath sounds: Normal breath sounds.  Abdominal:     Tenderness: There is no right CVA tenderness or left CVA tenderness.  Neurological:     Mental Status: She is alert.           Assessment & Plan:  UTI, treat with 7 days of Cipro.  Gershon Crane, MD

## 2023-03-04 DIAGNOSIS — H26493 Other secondary cataract, bilateral: Secondary | ICD-10-CM | POA: Diagnosis not present

## 2023-03-04 DIAGNOSIS — H40053 Ocular hypertension, bilateral: Secondary | ICD-10-CM | POA: Diagnosis not present

## 2023-03-04 DIAGNOSIS — D23112 Other benign neoplasm of skin of right lower eyelid, including canthus: Secondary | ICD-10-CM | POA: Diagnosis not present

## 2023-03-04 DIAGNOSIS — H02135 Senile ectropion of left lower eyelid: Secondary | ICD-10-CM | POA: Diagnosis not present

## 2023-03-10 DIAGNOSIS — C44329 Squamous cell carcinoma of skin of other parts of face: Secondary | ICD-10-CM | POA: Diagnosis not present

## 2023-04-05 ENCOUNTER — Telehealth: Payer: Self-pay

## 2023-04-05 NOTE — Telephone Encounter (Signed)
Unsuccessful attempt to reach patient on preferred number listed in notes for scheduled AWV. Unable to leave message.

## 2023-04-11 ENCOUNTER — Other Ambulatory Visit: Payer: Self-pay | Admitting: Cardiology

## 2023-04-14 ENCOUNTER — Other Ambulatory Visit: Payer: Self-pay | Admitting: Adult Health

## 2023-04-24 ENCOUNTER — Other Ambulatory Visit: Payer: Self-pay | Admitting: Cardiology

## 2023-04-24 DIAGNOSIS — I4821 Permanent atrial fibrillation: Secondary | ICD-10-CM

## 2023-04-25 NOTE — Telephone Encounter (Signed)
Prescription refill request for Eliquis received. Indication: a fib Last office visit: 09/11/22 Scr: 1.14 epic 11/17/22 Age: 88 Weight: 56 kg

## 2023-06-29 ENCOUNTER — Ambulatory Visit (INDEPENDENT_AMBULATORY_CARE_PROVIDER_SITE_OTHER): Payer: Medicare PPO | Admitting: Adult Health

## 2023-06-29 ENCOUNTER — Encounter: Payer: Self-pay | Admitting: Adult Health

## 2023-06-29 VITALS — BP 120/70 | HR 77 | Temp 98.3°F | Ht 60.0 in | Wt 122.2 lb

## 2023-06-29 DIAGNOSIS — I4821 Permanent atrial fibrillation: Secondary | ICD-10-CM

## 2023-06-29 DIAGNOSIS — I10 Essential (primary) hypertension: Secondary | ICD-10-CM | POA: Diagnosis not present

## 2023-06-29 DIAGNOSIS — M1A9XX0 Chronic gout, unspecified, without tophus (tophi): Secondary | ICD-10-CM

## 2023-06-29 DIAGNOSIS — Z0000 Encounter for general adult medical examination without abnormal findings: Secondary | ICD-10-CM

## 2023-06-29 DIAGNOSIS — E559 Vitamin D deficiency, unspecified: Secondary | ICD-10-CM

## 2023-06-29 DIAGNOSIS — Z7984 Long term (current) use of oral hypoglycemic drugs: Secondary | ICD-10-CM | POA: Diagnosis not present

## 2023-06-29 DIAGNOSIS — Z853 Personal history of malignant neoplasm of breast: Secondary | ICD-10-CM

## 2023-06-29 DIAGNOSIS — E119 Type 2 diabetes mellitus without complications: Secondary | ICD-10-CM | POA: Diagnosis not present

## 2023-06-29 DIAGNOSIS — E7800 Pure hypercholesterolemia, unspecified: Secondary | ICD-10-CM | POA: Diagnosis not present

## 2023-06-29 LAB — COMPREHENSIVE METABOLIC PANEL
ALT: 18 U/L (ref 0–35)
AST: 19 U/L (ref 0–37)
Albumin: 3.9 g/dL (ref 3.5–5.2)
Alkaline Phosphatase: 96 U/L (ref 39–117)
BUN: 22 mg/dL (ref 6–23)
CO2: 24 meq/L (ref 19–32)
Calcium: 9.4 mg/dL (ref 8.4–10.5)
Chloride: 108 meq/L (ref 96–112)
Creatinine, Ser: 1.18 mg/dL (ref 0.40–1.20)
GFR: 41.84 mL/min — ABNORMAL LOW (ref >60.00)
Glucose, Bld: 155 mg/dL — ABNORMAL HIGH (ref 70–99)
Potassium: 3.6 meq/L (ref 3.5–5.1)
Sodium: 142 meq/L (ref 135–145)
Total Bilirubin: 0.4 mg/dL (ref 0.2–1.2)
Total Protein: 7.4 g/dL (ref 6.0–8.3)

## 2023-06-29 LAB — CBC WITH DIFFERENTIAL/PLATELET
Basophils Absolute: 0.1 10*3/uL (ref 0.0–0.1)
Basophils Relative: 0.9 % (ref 0.0–3.0)
Eosinophils Absolute: 0.1 10*3/uL (ref 0.0–0.7)
Eosinophils Relative: 0.8 % (ref 0.0–5.0)
HCT: 39.9 % (ref 36.0–46.0)
Hemoglobin: 12.9 g/dL (ref 12.0–15.0)
Lymphocytes Relative: 18.6 % (ref 12.0–46.0)
Lymphs Abs: 1.2 10*3/uL (ref 0.7–4.0)
MCHC: 32.2 g/dL (ref 30.0–36.0)
MCV: 98.2 fl (ref 78.0–100.0)
Monocytes Absolute: 0.5 10*3/uL (ref 0.1–1.0)
Monocytes Relative: 7.8 % (ref 3.0–12.0)
Neutro Abs: 4.8 10*3/uL (ref 1.4–7.7)
Neutrophils Relative %: 71.9 % (ref 43.0–77.0)
Platelets: 211 10*3/uL (ref 150.0–400.0)
RBC: 4.07 Mil/uL (ref 3.87–5.11)
RDW: 14.1 % (ref 11.5–15.5)
WBC: 6.6 10*3/uL (ref 4.0–10.5)

## 2023-06-29 LAB — LIPID PANEL
Cholesterol: 116 mg/dL (ref 0–200)
HDL: 60.8 mg/dL (ref >39.00)
LDL Cholesterol: 36 mg/dL (ref 0–99)
NonHDL: 55.69
Total CHOL/HDL Ratio: 2
Triglycerides: 99 mg/dL (ref 0.0–149.0)
VLDL: 19.8 mg/dL (ref 0.0–40.0)

## 2023-06-29 LAB — VITAMIN D 25 HYDROXY (VIT D DEFICIENCY, FRACTURES): VITD: 50.75 ng/mL (ref 30.00–100.00)

## 2023-06-29 LAB — TSH: TSH: 1.51 u[IU]/mL (ref 0.35–5.50)

## 2023-06-29 LAB — HEMOGLOBIN A1C: Hgb A1c MFr Bld: 8.2 % — ABNORMAL HIGH (ref 4.6–6.5)

## 2023-06-29 NOTE — Patient Instructions (Signed)
 It was great seeing you today   We will follow up with you regarding your lab work   Please let me know if you need anything

## 2023-06-29 NOTE — Progress Notes (Signed)
 Subjective:    Patient ID: Adrienne [name], female    DOB: 08/25/1934, 89 y.o.   MRN: [id number]  HPI Patient presents for yearly preventative medicine examination. She is a pleasant and remarkable 89 year old female who  has a past medical history of Allergy, Atrial fibrillation (HCC), Breast cancer (HCC) (08/25/12), Diabetes mellitus, Family history of cancer, Gout, Hearing loss, History of breast cancer, History of frequent urinary tract infections, History of radiation therapy (03/21/18- 04/19/18), radiation therapy (11/22/12- 12/19/12), Hyperlipidemia, Hypertension, and Osteopenia.  Diabetes mellitus type II-managed with  glipizide 2.5 mg XR daily.   She denies episodes of hypoglycemia.  She does try and stay active with yoga and cardio classes multiple times a week and then does weight training 7 days a week.  During her last visit Metformin was d/c due to A1c being 6.3  Lab Results  Component Value Date   HGBA1C 6.8 (A) 01/07/2023   HGBA1C 6.3 (A) 10/06/2022   HGBA1C 8.2 (H) 06/26/2022   HTN -managed with Cardizem 360 mg daily and atenolol 5 mg daily.  She denies dizziness, lightheadedness, chest pain, or shortness of breath. She has not taken her blood pressure medication this morning.  BP Readings from Last 3 Encounters:  06/29/23 120/70  03/02/23 128/76  02/03/23 128/74   Atrial Fibrillation -she remains on Xarelto 2.5 mg twice daily and Cardizem  Hyperlipidemia - Managed with Crestor 5 mg daily. She denies myalgia or fatigue  Lab Results  Component Value Date   CHOL 85 06/26/2022   HDL 46.90 06/26/2022   LDLCALC 20 06/26/2022   LDLDIRECT 124.0 07/22/2006   TRIG 87.0 06/26/2022   CHOLHDL 2 06/26/2022   Gout - takes allopurinol 300 mg daily - no recent gour flares.   H/o breast cancer - is seen by Oncology every 6 months. Has completed treatment.   Vitamin D Deficiency - takes 1000 units daily.   All immunizations and health maintenance protocols were reviewed with the  patient and needed orders were placed.  Appropriate screening laboratory values were ordered for the patient including screening of hyperlipidemia, renal function and hepatic function.  Medication reconciliation,  past medical history, social history, problem list and allergies were reviewed in detail with the patient  Goals were established with regard to weight loss, exercise, and  diet in compliance with medications. She does stay active and eat healthy    Review of Systems  Constitutional: Negative.   HENT:  Positive for hearing loss.   Eyes: Negative.   Respiratory: Negative.    Cardiovascular: Negative.   Gastrointestinal: Negative.   Endocrine: Negative.   Genitourinary: Negative.   Musculoskeletal: Negative.   Skin: Negative.   Allergic/Immunologic: Negative.   Neurological: Negative.   Hematological: Negative.   Psychiatric/Behavioral: Negative.     Past Medical History:  Diagnosis Date   Allergy    Atrial fibrillation (HCC)    Breast cancer (HCC) 08/25/12   Invasive ductal ca,DCIS   Diabetes mellitus    type II   Family history of cancer    Gout    Hearing loss    History of breast cancer    History of frequent urinary tract infections    History of radiation therapy 03/21/18- 04/19/18   Right Breast 2.67 Gy X 15 fractions, right breast boost 2 Gy X 5 fractions.    Hx of radiation therapy 11/22/12- 12/19/12   breast 4250 cGy 17 sessions, left breast boost 750 cGy 3 sessions   Hyperlipidemia  Hypertension    Osteopenia     Social History   Socioeconomic History   Marital status: Married    Spouse name: Not on file   Number of children: 3   Years of education: Not on file   Highest education level: Not on file  Occupational History   Not on file  Tobacco Use   Smoking status: Former    Current packs/day: 0.25    Average packs/day: 0.3 packs/day for 4.0 years (1.0 ttl pk-yrs)    Types: Cigarettes   Smokeless tobacco: Never   Tobacco comments:     Cigarette use was 50 years ago  Vaping Use   Vaping status: Never Used  Substance and Sexual Activity   Alcohol use: Not Currently   Drug use: No   Sexual activity: Not Currently    Comment: menarche age 54, 1st birth age 89, G54, HRT x 7 years?  Other Topics Concern   Not on file  Social History Narrative   Married - husband in memory care unit    Three children ( One lives at [place] and two in [place]      She likes to read and go to [employer]    Social Drivers of Health   Financial Resource Strain: Low Risk  (07/15/2022)   Overall Financial Resource Strain (CARDIA)    Difficulty of Paying Living Expenses: Not hard at all  Food Insecurity: No Food Insecurity (07/15/2022)   Hunger Vital Sign    Worried About Running Out of Food in the Last Year: Never true    Ran Out of Food in the Last Year: Never true  Transportation Needs: No Transportation Needs (07/15/2022)   PRAPARE - Administrator, Civil Service (Medical): No    Lack of Transportation (Non-Medical): No  Physical Activity: Sufficiently Active (03/31/2022)   Exercise Vital Sign    Days of Exercise per Week: 7 days    Minutes of Exercise per Session: 90 min  Stress: No Stress Concern Present (03/31/2022)   [employer] of Occupational Health - Occupational Stress Questionnaire    Feeling of Stress : Not at all  Social Connections: Moderately Integrated (03/31/2022)   Social Connection and Isolation Panel [NHANES]    Frequency of Communication with Friends and Family: More than three times a week    Frequency of Social Gatherings with Friends and Family: More than three times a week    Attends Religious Services: More than 4 times per year    Active Member of [employer] or Organizations: Yes    Attends Banker Meetings: More than 4 times per year    Marital Status: Widowed  Intimate Partner Violence: Not At Risk (03/31/2022)   Humiliation, Afraid, Rape, and Kick questionnaire    Fear of  Current or Ex-Partner: No    Emotionally Abused: No    Physically Abused: No    Sexually Abused: No    Past Surgical History:  Procedure Laterality Date   ABDOMINAL HYSTERECTOMY Bilateral 1999   w/b/l salpingo-oopherectomy   APPENDECTOMY     BREAST LUMPECTOMY WITH NEEDLE LOCALIZATION AND AXILLARY SENTINEL LYMPH NODE BX Left 09/12/2012   Procedure: LEFT NEEDLE LOCALIZATION BREAST LUMPECTOMY TION AND AXILLARY SENTINEL LYMPH NODE BX;  Surgeon: Mariella Saa, MD;  Location: [facility];  Service: General;  Laterality: Left;   BREAST LUMPECTOMY WITH RADIOACTIVE SEED AND SENTINEL LYMPH NODE BIOPSY Right 10/26/2017   Procedure: BREAST LUMPECTOMY WITH RADIOACTIVE SEED AND SENTINEL LYMPH NODE BIOPSY;  Surgeon:  Glenna Fellows, MD;  Location: [facility];  Service: General;  Laterality: Right;   BREAST SURGERY  1990's   right- fibroid cyst   CHOLECYSTECTOMY     SEPTOPLASTY     TONSILLECTOMY      Family History  Problem Relation Age of Onset   Hypertension Mother    Heart disease Mother        Murmur and irregular heart disease   Dementia Mother        d. 56   Hypertension Father    Aneurysm Father 62   Breast cancer Cousin 72       mat first cousin    Allergies  Allergen Reactions   Penicillins Hives    Has patient had a PCN reaction causing immediate rash, facial/tongue/throat swelling, SOB or lightheadedness with hypotension: No Has patient had a PCN reaction causing severe rash involving mucus membranes or skin necrosis: No Has patient had a PCN reaction that required hospitalization: No Has patient had a PCN reaction occurring within the last 10 years: No If all of the above answers are "NO", then may proceed with Cephalosporin use.   Other Hives   Prednisone Other (See Comments)    ELEVATED BLOOD SUGAR   Radiaplexrx [Wound Dressings] Hives   Sulfamethoxazole Nausea Only    Current Outpatient Medications on File Prior to Visit  Medication Sig Dispense  Refill   Accu-Chek Softclix Lancets lancets Use to test up to 4 times daily. 100 each 12   allopurinol (ZYLOPRIM) 300 MG tablet TAKE 1 TABLET BY MOUTH EVERY DAY 90 tablet 3   atenolol (TENORMIN) 25 MG tablet TAKE 1 TABLET BY MOUTH EVERY DAY 90 tablet 3   blood glucose meter kit and supplies KIT Dispense based on patient and insurance preference. Use up to four times daily as directed. 1 each 0   chlorpheniramine (CHLOR-TRIMETON) 4 MG tablet Take 4 mg 2 (two) times daily as needed by mouth for allergies.     ciprofloxacin (CIPRO) 500 MG tablet Take 1 tablet (500 mg total) by mouth 2 (two) times daily. 14 tablet 0   diltiazem (CARDIZEM CD) 360 MG 24 hr capsule TAKE 1 CAPSULE BY MOUTH EVERY DAY 90 capsule 3   ELIQUIS 2.5 MG TABS tablet TAKE 1 TABLET BY MOUTH TWICE A DAY 60 tablet 5   erythromycin ophthalmic ointment Place 1 Application into the right eye 3 (three) times daily. 1 g 1   fish oil-omega-3 fatty acids 1000 MG capsule Take 1 g by mouth daily.     glipiZIDE (GLUCOTROL XL) 2.5 MG 24 hr tablet TAKE 1 TABLET BY MOUTH EVERY DAY WITH BREAKFAST 90 tablet 1   glucose blood (ACCU-CHEK GUIDE) test strip TEST UP TO FOUR TIMES A DAY 100 strip 3   metFORMIN (GLUCOPHAGE) 500 MG tablet Take 1 tablet (500 mg total) by mouth 2 (two) times daily with a meal. 180 tablet 3   Multiple Vitamin (MULTIVITAMIN) tablet Take 1 tablet by mouth daily.     OVER THE COUNTER MEDICATION Take by mouth 2 (two) times daily. Presavision- for macular degeneration prevention     rosuvastatin (CRESTOR) 5 MG tablet TAKE 1 TABLET (5 MG TOTAL) BY MOUTH DAILY. 90 tablet 3   VITAMIN D, ERGOCALCIFEROL, PO Take by mouth.     No current facility-administered medications on file prior to visit.    BP 120/70   Pulse 77   Temp 98.3 F (36.8 C) (Oral)   Ht 5' (1.524 m)  Wt 122 lb 3.2 oz (55.4 kg)   SpO2 97%   BMI 23.87 kg/m       Objective:   Physical Exam Vitals and nursing note reviewed.  Constitutional:      General:  She is not in acute distress.    Appearance: Normal appearance. She is not ill-appearing.  HENT:     Head: Normocephalic and atraumatic.     Right Ear: Tympanic membrane, ear canal and external ear normal. There is no impacted cerumen.     Left Ear: Tympanic membrane, ear canal and external ear normal. There is no impacted cerumen.     Nose: Nose normal. No congestion or rhinorrhea.     Mouth/Throat:     Mouth: Mucous membranes are moist.     Pharynx: Oropharynx is clear.  Eyes:     Extraocular Movements: Extraocular movements intact.     Conjunctiva/sclera: Conjunctivae normal.     Pupils: Pupils are equal, round, and reactive to light.  Neck:     Vascular: No carotid bruit.  Cardiovascular:     Rate and Rhythm: Normal rate. Rhythm irregular.     Pulses: Normal pulses.     Heart sounds: No murmur heard.    No friction rub. No gallop.  Pulmonary:     Effort: Pulmonary effort is normal.     Breath sounds: Normal breath sounds.  Abdominal:     General: Abdomen is flat. Bowel sounds are normal. There is no distension.     Palpations: Abdomen is soft. There is no mass.     Tenderness: There is no abdominal tenderness. There is no guarding or rebound.     Hernia: No hernia is present.  Musculoskeletal:        General: Normal range of motion.     Cervical back: Normal range of motion and neck supple.  Lymphadenopathy:     Cervical: No cervical adenopathy.  Skin:    General: Skin is warm and dry.     Capillary Refill: Capillary refill takes less than 2 seconds.  Neurological:     General: No focal deficit present.     Mental Status: She is alert and oriented to person, place, and time.  Psychiatric:        Mood and Affect: Mood normal.        Behavior: Behavior normal.        Thought Content: Thought content normal.        Judgment: Judgment normal.       Assessment & Plan:  1. Routine general medical examination at a health care facility (Primary) Today patient counseled  on age appropriate routine health concerns for screening and prevention, each reviewed and up to date or declined. Immunizations reviewed and up to date or declined. Labs ordered and reviewed. Risk factors for depression reviewed and negative. Hearing function and visual acuity are intact. ADLs screened and addressed as needed. Functional ability and level of safety reviewed and appropriate. Education, counseling and referrals performed based on assessed risks today. Patient provided with a copy of personalized plan for preventive services.   2. Diabetes mellitus treated with oral medication (HCC)  Consider increase in glipizide  - CBC with Differential/Platelet; Future - Comprehensive metabolic panel; Future - Lipid panel; Future - TSH; Future - Hemoglobin A1c; Future  3. Essential hypertension - Well controlled. No change in medication  - CBC with Differential/Platelet; Future - Comprehensive metabolic panel; Future - Lipid panel; Future - TSH; Future  4. Permanent atrial  fibrillation [facility]) - Per cardiology  - CBC with Differential/Platelet; Future - Comprehensive metabolic panel; Future - Lipid panel; Future - TSH; Future  5. Pure hypercholesterolemia - Continue statin  - CBC with Differential/Platelet; Future - Comprehensive metabolic panel; Future - Lipid panel; Future - TSH; Future  6. Chronic gout without tophus, unspecified cause, unspecified site - Continue allopurinol  - CBC with Differential/Platelet; Future - Comprehensive metabolic panel; Future - Lipid panel; Future - TSH; Future  7. History of breast cancer - Follow up with Oncology  - CBC with Differential/Platelet; Future - Comprehensive metabolic panel; Future - Lipid panel; Future - TSH; Future  8. Vitamin D deficiency - VITAMIN D 25 Hydroxy (Vit-D Deficiency, Fractures); Future  Shirline Frees, NP

## 2023-07-01 ENCOUNTER — Other Ambulatory Visit: Payer: Self-pay

## 2023-07-01 MED ORDER — GLIPIZIDE ER 5 MG PO TB24: 5.0000 mg | ORAL_TABLET | Freq: Every day | ORAL | 1 refills | Status: DC

## 2023-07-14 ENCOUNTER — Ambulatory Visit: Admitting: Adult Health

## 2023-07-14 VITALS — BP 120/80 | HR 102 | Temp 97.4°F | Wt 121.0 lb

## 2023-07-14 DIAGNOSIS — R197 Diarrhea, unspecified: Secondary | ICD-10-CM | POA: Diagnosis not present

## 2023-07-14 DIAGNOSIS — R5383 Other fatigue: Secondary | ICD-10-CM | POA: Diagnosis not present

## 2023-07-14 LAB — CBC
HCT: 41.1 % (ref 36.0–46.0)
Hemoglobin: 13.4 g/dL (ref 12.0–15.0)
MCHC: 32.6 g/dL (ref 30.0–36.0)
MCV: 97 fl (ref 78.0–100.0)
Platelets: 176 10*3/uL (ref 150.0–400.0)
RBC: 4.24 Mil/uL (ref 3.87–5.11)
RDW: 15.1 % (ref 11.5–15.5)
WBC: 7.3 10*3/uL (ref 4.0–10.5)

## 2023-07-14 LAB — BASIC METABOLIC PANEL
BUN: 21 mg/dL (ref 6–23)
CO2: 25 meq/L (ref 19–32)
Calcium: 9.2 mg/dL (ref 8.4–10.5)
Chloride: 101 meq/L (ref 96–112)
Creatinine, Ser: 1.19 mg/dL (ref 0.40–1.20)
GFR: 41.41 mL/min — ABNORMAL LOW (ref >60.00)
Glucose, Bld: 172 mg/dL — ABNORMAL HIGH (ref 70–99)
Potassium: 3.7 meq/L (ref 3.5–5.1)
Sodium: 134 meq/L — ABNORMAL LOW (ref 135–145)

## 2023-07-14 NOTE — Progress Notes (Signed)
 Subjective:    Patient ID: Adrienne [name], female    DOB: [birth year], 89 y.o.   MRN: [id number]  Diabetes   89 year old female who  has a past medical history of Allergy, Atrial fibrillation (HCC), Breast cancer (HCC) (08/25/12), Diabetes mellitus, Family history of cancer, Gout, Hearing loss, History of breast cancer, History of frequent urinary tract infections, History of radiation therapy (03/21/18- 04/19/18), radiation therapy (11/22/12- 12/19/12), Hyperlipidemia, Hypertension, and Osteopenia.  She presents to the office today for an acute visit. She reports that for the last four days she has been experiencing fatigue and diarrhea. She reports that she at lunch which was fried chicken and after that she developed diarrhea and had multiple episodes a day. She has not had a bowel movement yet today but she did take imodium for the last two days. She has been drinking Gatorade to keep her hydrated. She has not had n/v fevers, chills, or abdominal pain.   She would also like to check to see if she has a UTI. She does not necessarily feel like she has symptoms but has been urinating more - also increased hydration.    Review of Systems See HPI   Past Medical History:  Diagnosis Date   Allergy    Atrial fibrillation (HCC)    Breast cancer (HCC) 08/25/12   Invasive ductal ca,DCIS   Diabetes mellitus    type II   Family history of cancer    Gout    Hearing loss    History of breast cancer    History of frequent urinary tract infections    History of radiation therapy 03/21/18- 04/19/18   Right Breast 2.67 Gy X 15 fractions, right breast boost 2 Gy X 5 fractions.    Hx of radiation therapy 11/22/12- 12/19/12   breast 4250 cGy 17 sessions, left breast boost 750 cGy 3 sessions   Hyperlipidemia    Hypertension    Osteopenia     Social History   Socioeconomic History   Marital status: Married    Spouse name: Not on file   Number of children: 3   Years of education: Not on file    Highest education level: Not on file  Occupational History   Not on file  Tobacco Use   Smoking status: Former    Current packs/day: 0.25    Average packs/day: 0.3 packs/day for 4.0 years (1.0 ttl pk-yrs)    Types: Cigarettes   Smokeless tobacco: Never   Tobacco comments:    Cigarette use was 50 years ago  Vaping Use   Vaping status: Never Used  Substance and Sexual Activity   Alcohol use: Not Currently   Drug use: No   Sexual activity: Not Currently    Comment: menarche age 50, 1st birth age 71, G68, HRT x 7 years?  Other Topics Concern   Not on file  Social History Narrative   Married - husband in memory care unit    Three children ( One lives at [place] and two in [place]      She likes to read and go to [employer]    Social Drivers of Health   Financial Resource Strain: Low Risk  (07/15/2022)   Overall Financial Resource Strain (CARDIA)    Difficulty of Paying Living Expenses: Not hard at all  Food Insecurity: No Food Insecurity (07/15/2022)   Hunger Vital Sign    Worried About Running Out of Food in the Last Year: Never true  Ran Out of Food in the Last Year: Never true  Transportation Needs: No Transportation Needs (07/15/2022)   PRAPARE - Administrator, Civil Service (Medical): No    Lack of Transportation (Non-Medical): No  Physical Activity: Sufficiently Active (03/31/2022)   Exercise Vital Sign    Days of Exercise per Week: 7 days    Minutes of Exercise per Session: 90 min  Stress: No Stress Concern Present (03/31/2022)   [employer] of Occupational Health - Occupational Stress Questionnaire    Feeling of Stress : Not at all  Social Connections: Moderately Integrated (03/31/2022)   Social Connection and Isolation Panel [NHANES]    Frequency of Communication with Friends and Family: More than three times a week    Frequency of Social Gatherings with Friends and Family: More than three times a week    Attends Religious Services: More than 4  times per year    Active Member of [employer] or Organizations: Yes    Attends Banker Meetings: More than 4 times per year    Marital Status: Widowed  Intimate Partner Violence: Not At Risk (03/31/2022)   Humiliation, Afraid, Rape, and Kick questionnaire    Fear of Current or Ex-Partner: No    Emotionally Abused: No    Physically Abused: No    Sexually Abused: No    Past Surgical History:  Procedure Laterality Date   ABDOMINAL HYSTERECTOMY Bilateral 1999   w/b/l salpingo-oopherectomy   APPENDECTOMY     BREAST LUMPECTOMY WITH NEEDLE LOCALIZATION AND AXILLARY SENTINEL LYMPH NODE BX Left 09/12/2012   Procedure: LEFT NEEDLE LOCALIZATION BREAST LUMPECTOMY TION AND AXILLARY SENTINEL LYMPH NODE BX;  Surgeon: Mariella Saa, MD;  Location: [facility];  Service: General;  Laterality: Left;   BREAST LUMPECTOMY WITH RADIOACTIVE SEED AND SENTINEL LYMPH NODE BIOPSY Right 10/26/2017   Procedure: BREAST LUMPECTOMY WITH RADIOACTIVE SEED AND SENTINEL LYMPH NODE BIOPSY;  Surgeon: Glenna Fellows, MD;  Location: [facility];  Service: General;  Laterality: Right;   BREAST SURGERY  1990's   right- fibroid cyst   CHOLECYSTECTOMY     SEPTOPLASTY     TONSILLECTOMY      Family History  Problem Relation Age of Onset   Hypertension Mother    Heart disease Mother        Murmur and irregular heart disease   Dementia Mother        d. 2   Hypertension Father    Aneurysm Father 37   Breast cancer Cousin 72       mat first cousin    Allergies  Allergen Reactions   Penicillins Hives    Has patient had a PCN reaction causing immediate rash, facial/tongue/throat swelling, SOB or lightheadedness with hypotension: No Has patient had a PCN reaction causing severe rash involving mucus membranes or skin necrosis: No Has patient had a PCN reaction that required hospitalization: No Has patient had a PCN reaction occurring within the last 10 years: No If all of the above answers are  "NO", then may proceed with Cephalosporin use.   Other Hives   Prednisone Other (See Comments)    ELEVATED BLOOD SUGAR   Radiaplexrx [Wound Dressings] Hives   Sulfamethoxazole Nausea Only    Current Outpatient Medications on File Prior to Visit  Medication Sig Dispense Refill   Accu-Chek Softclix Lancets lancets Use to test up to 4 times daily. 100 each 12   allopurinol (ZYLOPRIM) 300 MG tablet TAKE 1 TABLET BY MOUTH EVERY  DAY 90 tablet 3   atenolol (TENORMIN) 25 MG tablet TAKE 1 TABLET BY MOUTH EVERY DAY 90 tablet 3   blood glucose meter kit and supplies KIT Dispense based on patient and insurance preference. Use up to four times daily as directed. 1 each 0   chlorpheniramine (CHLOR-TRIMETON) 4 MG tablet Take 4 mg 2 (two) times daily as needed by mouth for allergies.     diltiazem (CARDIZEM CD) 360 MG 24 hr capsule TAKE 1 CAPSULE BY MOUTH EVERY DAY 90 capsule 3   ELIQUIS 2.5 MG TABS tablet TAKE 1 TABLET BY MOUTH TWICE A DAY 60 tablet 5   fish oil-omega-3 fatty acids 1000 MG capsule Take 1 g by mouth daily.     glipiZIDE (GLUCOTROL XL) 5 MG 24 hr tablet Take 1 tablet (5 mg total) by mouth daily with breakfast. 90 tablet 1   glucose blood (ACCU-CHEK GUIDE) test strip TEST UP TO FOUR TIMES A DAY 100 strip 3   metFORMIN (GLUCOPHAGE) 500 MG tablet Take 1 tablet (500 mg total) by mouth 2 (two) times daily with a meal. 180 tablet 3   Multiple Vitamin (MULTIVITAMIN) tablet Take 1 tablet by mouth daily.     OVER THE COUNTER MEDICATION Take by mouth 2 (two) times daily. Presavision- for macular degeneration prevention     rosuvastatin (CRESTOR) 5 MG tablet TAKE 1 TABLET (5 MG TOTAL) BY MOUTH DAILY. 90 tablet 3   VITAMIN D, ERGOCALCIFEROL, PO Take by mouth.     No current facility-administered medications on file prior to visit.    BP 120/80   Pulse (!) 102   Temp (!) 97.4 F (36.3 C) (Oral)   Wt 121 lb (54.9 kg)   SpO2 97%   BMI 23.63 kg/m       Objective:   Physical  Exam Constitutional:      Appearance: Normal appearance.  Cardiovascular:     Rate and Rhythm: Normal rate and regular rhythm.     Pulses: Normal pulses.     Heart sounds: Normal heart sounds.  Pulmonary:     Effort: Pulmonary effort is normal.     Breath sounds: Normal breath sounds.  Abdominal:     General: Abdomen is flat. Bowel sounds are normal. There is no distension.     Palpations: Abdomen is soft.     Tenderness: There is no abdominal tenderness.  Skin:    General: Skin is warm and dry.  Neurological:     General: No focal deficit present.     Mental Status: She is alert and oriented to person, place, and time.  Psychiatric:        Mood and Affect: Mood normal.        Behavior: Behavior normal.        Thought Content: Thought content normal.        Judgment: Judgment normal.       Assessment & Plan:  1. Diarrhea, unspecified type (Primary) - likely from viral illness vs food bourne. Stop taking imodium, bland diet, hydrate and rest for the next few days  - Follow up as needed - Basic Metabolic Panel; Future - Urine Culture; Future - Urinalysis; Future - CBC; Future  2. Other fatigue -  Basic Metabolic Panel; Future - Urine Culture; Future - Urinalysis; Future - CBC; Future  Shirline Frees, NP

## 2023-07-14 NOTE — Patient Instructions (Signed)
 I am going to check your kidney function and a urine sample today   Stay hydrated and eat a bland diet for the next 2 days   Rest!

## 2023-07-15 ENCOUNTER — Other Ambulatory Visit: Payer: Self-pay | Admitting: Adult Health

## 2023-07-15 LAB — URINALYSIS, ROUTINE W REFLEX MICROSCOPIC
Bilirubin Urine: NEGATIVE
Ketones, ur: NEGATIVE
Nitrite: POSITIVE — AB
Specific Gravity, Urine: <=1.005 — AB (ref 1.000–1.030)
Total Protein, Urine: 30 — AB
Urine Glucose: NEGATIVE
Urobilinogen, UA: 0.2 (ref 0.0–1.0)
pH: 6 (ref 5.0–8.0)

## 2023-07-18 LAB — URINE CULTURE
MICRO NUMBER:: 16197385
SPECIMEN QUALITY:: ADEQUATE

## 2023-07-20 ENCOUNTER — Other Ambulatory Visit: Payer: Self-pay | Admitting: Adult Health

## 2023-07-20 MED ORDER — CIPROFLOXACIN HCL 500 MG PO TABS: 500.0000 mg | ORAL_TABLET | Freq: Two times a day (BID) | ORAL | 0 refills | Status: AC

## 2023-08-02 ENCOUNTER — Other Ambulatory Visit: Payer: Self-pay | Admitting: Adult Health

## 2023-08-02 DIAGNOSIS — M1A9XX0 Chronic gout, unspecified, without tophus (tophi): Secondary | ICD-10-CM

## 2023-09-02 DIAGNOSIS — D1801 Hemangioma of skin and subcutaneous tissue: Secondary | ICD-10-CM | POA: Diagnosis not present

## 2023-09-02 DIAGNOSIS — Z85828 Personal history of other malignant neoplasm of skin: Secondary | ICD-10-CM | POA: Diagnosis not present

## 2023-09-02 DIAGNOSIS — L089 Local infection of the skin and subcutaneous tissue, unspecified: Secondary | ICD-10-CM | POA: Diagnosis not present

## 2023-09-02 DIAGNOSIS — L570 Actinic keratosis: Secondary | ICD-10-CM | POA: Diagnosis not present

## 2023-09-02 DIAGNOSIS — D229 Melanocytic nevi, unspecified: Secondary | ICD-10-CM | POA: Diagnosis not present

## 2023-09-02 DIAGNOSIS — L578 Other skin changes due to chronic exposure to nonionizing radiation: Secondary | ICD-10-CM | POA: Diagnosis not present

## 2023-09-02 DIAGNOSIS — L814 Other melanin hyperpigmentation: Secondary | ICD-10-CM | POA: Diagnosis not present

## 2023-09-02 DIAGNOSIS — L821 Other seborrheic keratosis: Secondary | ICD-10-CM | POA: Diagnosis not present

## 2023-09-14 DIAGNOSIS — N390 Urinary tract infection, site not specified: Secondary | ICD-10-CM | POA: Diagnosis not present

## 2023-09-28 ENCOUNTER — Other Ambulatory Visit: Payer: Self-pay | Admitting: Adult Health

## 2023-09-28 NOTE — Telephone Encounter (Signed)
 Patient need to schedule for more refills.

## 2023-10-21 ENCOUNTER — Telehealth: Payer: Self-pay | Admitting: *Deleted

## 2023-10-21 ENCOUNTER — Other Ambulatory Visit: Payer: Self-pay

## 2023-10-21 MED ORDER — GLIPIZIDE ER 5 MG PO TB24: 5.0000 mg | ORAL_TABLET | Freq: Every day | ORAL | 1 refills | Status: DC

## 2023-10-21 NOTE — Telephone Encounter (Signed)
 Pt notified that she is on 5 mg. Pt was still taking 2.5 mg dose. Rx sent to pharmacy again. Pt verbalized understanding.

## 2023-10-21 NOTE — Telephone Encounter (Signed)
 Copied from CRM [id number]. Topic: Clinical - Medication Question >> Oct 21, 2023 12:01 PM Adrienne [name] wrote: Reason for CRM: Patient is wanting to know if she is to take the Glipizide  2.5 mg or 5mg . Request a callback at [phone] for clarification.

## 2023-11-10 ENCOUNTER — Telehealth: Payer: Self-pay

## 2023-11-10 NOTE — Telephone Encounter (Signed)
 Copied from CRM [id number]. Topic: General - Other >> Nov 10, 2023  4:29 PM Aisha D wrote: Reason for CRM: Verneita with [facility] is calling to confirm if the therapy orders were sent for the pt because they haven't received anything as of yet. Verneita would like to have the orders resent ,Fax number is [phone].

## 2023-11-11 NOTE — Telephone Encounter (Signed)
 No form was received for pt an no number left to call Adrienne [name] to advise to send form.

## 2023-11-16 ENCOUNTER — Encounter: Payer: Self-pay | Admitting: Adult Health

## 2023-11-16 ENCOUNTER — Ambulatory Visit: Payer: Self-pay | Admitting: Adult Health

## 2023-11-16 ENCOUNTER — Other Ambulatory Visit: Payer: Self-pay

## 2023-11-16 VITALS — BP 136/82 | HR 86 | Temp 98.1°F | Ht 60.0 in | Wt 123.0 lb

## 2023-11-16 DIAGNOSIS — H6121 Impacted cerumen, right ear: Secondary | ICD-10-CM

## 2023-11-16 DIAGNOSIS — M5432 Sciatica, left side: Secondary | ICD-10-CM | POA: Diagnosis not present

## 2023-11-16 DIAGNOSIS — Z170 Estrogen receptor positive status [ER+]: Secondary | ICD-10-CM

## 2023-11-16 DIAGNOSIS — R2681 Unsteadiness on feet: Secondary | ICD-10-CM | POA: Diagnosis not present

## 2023-11-16 NOTE — Progress Notes (Signed)
 Subjective:    Patient ID: Adrienne [name], female    DOB: 08-06-1934, 89 y.o.   MRN: [id number]  HPI 89 year old female who  has a past medical history of Allergy, Atrial fibrillation (HCC), Breast cancer (HCC) (08/25/12), Diabetes mellitus, Family history of cancer, Gout, Hearing loss, History of breast cancer, History of frequent urinary tract infections, History of radiation therapy (03/21/18- 04/19/18), radiation therapy (11/22/12- 12/19/12), Hyperlipidemia, Hypertension, and Osteopenia.  She presents to the office today for a chronic issue. She has a long standing history of low back pain with sciatica. She has done PT in the past and has good results for awhile after finishing PT. She last did PT at [facility] where she resides in 12/2022. The sciatica has returned and she feels unsteady when she walks; she would like to go back to PT   She also has some loss of hearing past baseline in her right ear and she feels as though it is full of wax.     Review of Systems See HPI   Past Medical History:  Diagnosis Date   Allergy    Atrial fibrillation (HCC)    Breast cancer (HCC) 08/25/12   Invasive ductal ca,DCIS   Diabetes mellitus    type II   Family history of cancer    Gout    Hearing loss    History of breast cancer    History of frequent urinary tract infections    History of radiation therapy 03/21/18- 04/19/18   Right Breast 2.67 Gy X 15 fractions, right breast boost 2 Gy X 5 fractions.    Hx of radiation therapy 11/22/12- 12/19/12   breast 4250 cGy 17 sessions, left breast boost 750 cGy 3 sessions   Hyperlipidemia    Hypertension    Osteopenia     Social History   Socioeconomic History   Marital status: Married    Spouse name: Not on file   Number of children: 3   Years of education: Not on file   Highest education level: Not on file  Occupational History   Not on file  Tobacco Use   Smoking status: Former    Current packs/day: 0.25    Average packs/day: 0.3  packs/day for 4.0 years (1.0 ttl pk-yrs)    Types: Cigarettes   Smokeless tobacco: Never   Tobacco comments:    Cigarette use was 50 years ago  Vaping Use   Vaping status: Never Used  Substance and Sexual Activity   Alcohol use: Not Currently   Drug use: No   Sexual activity: Not Currently    Comment: menarche age 45, 1st birth age 67, G35, HRT x 7 years?  Other Topics Concern   Not on file  Social History Narrative   Married - husband in memory care unit    Three children ( One lives at [place] and two in [place]      She likes to read and go to [employer]    Social Drivers of Health   Financial Resource Strain: Low Risk  (07/15/2022)   Overall Financial Resource Strain (CARDIA)    Difficulty of Paying Living Expenses: Not hard at all  Food Insecurity: No Food Insecurity (07/15/2022)   Hunger Vital Sign    Worried About Running Out of Food in the Last Year: Never true    Ran Out of Food in the Last Year: Never true  Transportation Needs: No Transportation Needs (07/15/2022)   PRAPARE - Transportation  Lack of Transportation (Medical): No    Lack of Transportation (Non-Medical): No  Physical Activity: Sufficiently Active (03/31/2022)   Exercise Vital Sign    Days of Exercise per Week: 7 days    Minutes of Exercise per Session: 90 min  Stress: No Stress Concern Present (03/31/2022)   [employer] of Occupational Health - Occupational Stress Questionnaire    Feeling of Stress : Not at all  Social Connections: Moderately Integrated (03/31/2022)   Social Connection and Isolation Panel    Frequency of Communication with Friends and Family: More than three times a week    Frequency of Social Gatherings with Friends and Family: More than three times a week    Attends Religious Services: More than 4 times per year    Active Member of [employer] or Organizations: Yes    Attends Banker Meetings: More than 4 times per year    Marital Status: Widowed  Intimate Partner  Violence: Not At Risk (03/31/2022)   Humiliation, Afraid, Rape, and Kick questionnaire    Fear of Current or Ex-Partner: No    Emotionally Abused: No    Physically Abused: No    Sexually Abused: No    Past Surgical History:  Procedure Laterality Date   ABDOMINAL HYSTERECTOMY Bilateral 1999   w/b/l salpingo-oopherectomy   APPENDECTOMY     BREAST LUMPECTOMY WITH NEEDLE LOCALIZATION AND AXILLARY SENTINEL LYMPH NODE BX Left 09/12/2012   Procedure: LEFT NEEDLE LOCALIZATION BREAST LUMPECTOMY TION AND AXILLARY SENTINEL LYMPH NODE BX;  Surgeon: Morene ONEIDA Olives, MD;  Location: [facility] OR;  Service: General;  Laterality: Left;   BREAST LUMPECTOMY WITH RADIOACTIVE SEED AND SENTINEL LYMPH NODE BIOPSY Right 10/26/2017   Procedure: BREAST LUMPECTOMY WITH RADIOACTIVE SEED AND SENTINEL LYMPH NODE BIOPSY;  Surgeon: Olives Morene, MD;  Location: [facility];  Service: General;  Laterality: Right;   BREAST SURGERY  1990's   right- fibroid cyst   CHOLECYSTECTOMY     SEPTOPLASTY     TONSILLECTOMY      Family History  Problem Relation Age of Onset   Hypertension Mother    Heart disease Mother        Murmur and irregular heart disease   Dementia Mother        d. 61   Hypertension Father    Aneurysm Father 74   Breast cancer Cousin 72       mat first cousin    Allergies  Allergen Reactions   Penicillins Hives    Has patient had a PCN reaction causing immediate rash, facial/tongue/throat swelling, SOB or lightheadedness with hypotension: No Has patient had a PCN reaction causing severe rash involving mucus membranes or skin necrosis: No Has patient had a PCN reaction that required hospitalization: No Has patient had a PCN reaction occurring within the last 10 years: No If all of the above answers are NO, then may proceed with Cephalosporin use.   Other Hives   Prednisone  Other (See Comments)    ELEVATED BLOOD SUGAR   Radiaplexrx [Wound Dressings] Hives   Sulfamethoxazole  Nausea Only    Current Outpatient Medications on File Prior to Visit  Medication Sig Dispense Refill   Accu-Chek Softclix Lancets lancets Use to test up to 4 times daily. 100 each 12   allopurinol  (ZYLOPRIM ) 300 MG tablet TAKE 1 TABLET BY MOUTH EVERY DAY 90 tablet 3   atenolol  (TENORMIN ) 25 MG tablet TAKE 1 TABLET BY MOUTH EVERY DAY 90 tablet 3   blood glucose meter  kit and supplies KIT Dispense based on patient and insurance preference. Use up to four times daily as directed. 1 each 0   chlorpheniramine (CHLOR-TRIMETON) 4 MG tablet Take 4 mg 2 (two) times daily as needed by mouth for allergies.     diltiazem  (CARDIZEM  CD) 360 MG 24 hr capsule TAKE 1 CAPSULE BY MOUTH EVERY DAY 90 capsule 3   ELIQUIS  2.5 MG TABS tablet TAKE 1 TABLET BY MOUTH TWICE A DAY 60 tablet 5   fish oil-omega-3 fatty acids 1000 MG capsule Take 1 g by mouth daily.     glipiZIDE  (GLUCOTROL  XL) 5 MG 24 hr tablet Take 1 tablet (5 mg total) by mouth daily with breakfast. 90 tablet 1   glucose blood (ACCU-CHEK GUIDE) test strip TEST UP TO FOUR TIMES A DAY 100 strip 3   metFORMIN  (GLUCOPHAGE ) 500 MG tablet Take 1 tablet (500 mg total) by mouth 2 (two) times daily with a meal. 180 tablet 3   Multiple Vitamin (MULTIVITAMIN) tablet Take 1 tablet by mouth daily.     OVER THE COUNTER MEDICATION Take by mouth 2 (two) times daily. Presavision- for macular degeneration prevention     rosuvastatin  (CRESTOR ) 5 MG tablet TAKE 1 TABLET (5 MG TOTAL) BY MOUTH DAILY. 90 tablet 3   VITAMIN D , ERGOCALCIFEROL , PO Take by mouth.     No current facility-administered medications on file prior to visit.    BP 136/82   Pulse 86   Temp 98.1 F (36.7 C) (Oral)   Ht 5' (1.524 m)   Wt 123 lb (55.8 kg)   SpO2 95%   BMI 24.02 kg/m       Objective:   Physical Exam Vitals and nursing note reviewed.  Constitutional:      Appearance: Normal appearance.  HENT:     Right Ear: There is impacted cerumen.  Cardiovascular:     Pulses: Normal  pulses.     Heart sounds: Normal heart sounds.  Pulmonary:     Effort: Pulmonary effort is normal.     Breath sounds: Normal breath sounds.  Musculoskeletal:        General: Tenderness present.  Skin:    General: Skin is warm and dry.     Capillary Refill: Capillary refill takes less than 2 seconds.  Neurological:     General: No focal deficit present.     Mental Status: She is alert and oriented to person, place, and time.     Gait: Gait abnormal (slow steady gait with cane).  Psychiatric:        Mood and Affect: Mood normal.        Behavior: Behavior normal.        Thought Content: Thought content normal.        Judgment: Judgment normal.        Assessment & Plan:  1. Left sided sciatica (Primary) - Paper prescription for PT given to patient so that she can have PT done at her residence   2. Gait instability   3. Impacted cerumen of right ear Ear Cerumen Removal  Date/Time: 11/16/2023 1:57 PM  Performed by: Merna Huxley, NP Authorized by: Merna Huxley, NP   Anesthesia: Local Anesthetic: none Location details: right ear Patient tolerance: patient tolerated the procedure well with no immediate complications Procedure type: irrigation  Sedation: Patient sedated: no

## 2023-11-16 NOTE — Assessment & Plan Note (Signed)
 pT1bN0, stage IA invasive ductal carcinoma of the left breast, grade I, ER 100%, PR 88%, Ki-67 16%, HER-2/neu negative. -Diagnosed in 09/2012, s/p lumpectomy with SLNB 09/12/2012, adjuvant radiation, and adjuvant antiestrogen therapy with exemestane  from 12/2012-11/2017

## 2023-11-16 NOTE — Assessment & Plan Note (Addendum)
 invasive ductal carcinoma, stage IB, pT1bN0M0, grade 2, Triple Negative, Ki67: 80% -Diagnosed in 09/2017, s/p right lumpectomy with SLNB, 12 weeks of adjuvant Abraxane , and adjuvant radiation  - genetic testing was negative -Last mammogram 12/08/21, bilateral breast biopsies showed fat necrosis, negative for malignancy. She is already scheduled next month -Adrienne [name] is clinically doing well from breast cancer standpoint. Exam shows considerable scar tissue and distortion, but similar to previous, prior biopsies negative. Labs are stable. Overall no clinical concern for recurrence.  -She has reached 5 years from definitive surgery, recurrence risk has decreased and she is pleased.  - Most recent bilateral screening mammogram in August 2024 was benign.  She is scheduled for screening mammogram in August 2025. -Labs and follow-up visit in 1 year, sooner if needed.

## 2023-11-16 NOTE — Progress Notes (Signed)
 Patient Care Team: Merna Huxley, NP as PCP - General (Family Medicine) Pietro Redell RAMAN, MD as PCP - Cardiology (Cardiology) Burton, Lacie K, NP as Nurse Practitioner (Hematology and Oncology)  Clinic Day:  11/21/2023  Referring physician: Merna Huxley, NP  ASSESSMENT & PLAN:   Assessment & Plan: Breast cancer of upper-outer quadrant of right female breast (HCC) invasive ductal carcinoma, stage IB, pT1bN0M0, grade 2, Triple Negative, Ki67: 80% -Diagnosed in 09/2017, s/p right lumpectomy with SLNB, 12 weeks of adjuvant Abraxane , and adjuvant radiation  - genetic testing was negative -Last mammogram 12/08/21, bilateral breast biopsies showed fat necrosis, negative for malignancy. She is already scheduled next month -Ms. Sterne is clinically doing well from breast cancer standpoint. Exam shows considerable scar tissue and distortion, but similar to previous, prior biopsies negative. Labs are stable. Overall no clinical concern for recurrence.  -She has reached 5 years from definitive surgery, recurrence risk has decreased and she is pleased.  - Most recent bilateral screening mammogram in August 2024 was benign.  She is scheduled for screening mammogram in August 2025. -Labs and follow-up visit in 1 year, sooner if needed.  Breast cancer, left breast (HCC) pT1bN0, stage IA invasive ductal carcinoma of the left breast, grade I, ER 100%, PR 88%, Ki-67 16%, HER-2/neu negative. -Diagnosed in 09/2012, s/p lumpectomy with SLNB 09/12/2012, adjuvant radiation, and adjuvant antiestrogen therapy with exemestane  from 12/2012-11/2017    Plan Labs reviewed. - Unremarkable CBC. -Mild CKD with GFR 52. Exam benign. Scheduled for bilateral screening mammogram in August 2025. Continue breast cancer surveillance Labs and follow-up in 1 year, sooner if needed.   The patient understands the plans discussed today and is in agreement with them.  She knows to contact our office if she develops concerns  prior to her next appointment.  I provided 25 minutes of face-to-face time during this encounter and > 50% was spent counseling as documented under my assessment and plan.    Powell FORBES Lessen, NP  [facility] - A DEPT OF [facility] [address] [place] [address] Dept: [phone] Dept Fax: [phone]   No orders of the defined types were placed in this encounter.     CHIEF COMPLAINT:  CC: Right breast cancer, estrogen receptor negative; H/O left breast cancer estrogen receptor positive  Current Treatment: Surveillance  INTERVAL HISTORY:  Daneka is here today for repeat clinical assessment.  She was last seen by Lacie, NP on 11/17/2022.  Most recent bilateral screening mammogram done 12/14/2022 with negative results.  Scheduled for annual screening mammogram in August 2025.  Currently being treated for osteoporosis by primary care provider. She exercises every day.  She walks, uses reclined elliptical, rides stationary bike, and stretches.  Has had several beach trips since May.  She denies chest pain, chest pressure, or shortness of breath. She denies headaches or visual disturbances. She denies abdominal pain, nausea, vomiting, or changes in bowel or bladder habits.  She denies fevers or chills. She denies pain. Her appetite is good. Her weight has been stable.  I have reviewed the past medical history, past surgical history, social history and family history with the mother.  Patient and they are unchanged from previous note.  ALLERGIES:  is allergic to penicillins, other, prednisone , radiaplexrx [wound dressings], and sulfamethoxazole.  MEDICATIONS:  Current Outpatient Medications  Medication Sig Dispense Refill   Accu-Chek Softclix Lancets lancets Use to test up to 4 times daily. 100  each 12   allopurinol  (ZYLOPRIM ) 300 MG tablet TAKE 1 TABLET BY MOUTH EVERY DAY 90 tablet 3   atenolol  (TENORMIN ) 25 MG  tablet TAKE 1 TABLET BY MOUTH EVERY DAY 90 tablet 3   blood glucose meter kit and supplies KIT Dispense based on patient and insurance preference. Use up to four times daily as directed. 1 each 0   chlorpheniramine (CHLOR-TRIMETON) 4 MG tablet Take 4 mg 2 (two) times daily as needed by mouth for allergies.     diltiazem  (CARDIZEM  CD) 360 MG 24 hr capsule TAKE 1 CAPSULE BY MOUTH EVERY DAY 90 capsule 3   ELIQUIS  2.5 MG TABS tablet TAKE 1 TABLET BY MOUTH TWICE A DAY 60 tablet 5   fish oil-omega-3 fatty acids 1000 MG capsule Take 1 g by mouth daily.     glipiZIDE  (GLUCOTROL  XL) 5 MG 24 hr tablet Take 1 tablet (5 mg total) by mouth daily with breakfast. 90 tablet 1   glucose blood (ACCU-CHEK GUIDE) test strip TEST UP TO FOUR TIMES A DAY 100 strip 3   metFORMIN  (GLUCOPHAGE ) 500 MG tablet Take 1 tablet (500 mg total) by mouth 2 (two) times daily with a meal. 180 tablet 3   Multiple Vitamin (MULTIVITAMIN) tablet Take 1 tablet by mouth daily.     OVER THE COUNTER MEDICATION Take by mouth 2 (two) times daily. Presavision- for macular degeneration prevention     rosuvastatin  (CRESTOR ) 5 MG tablet TAKE 1 TABLET (5 MG TOTAL) BY MOUTH DAILY. 90 tablet 3   VITAMIN D , ERGOCALCIFEROL , PO Take by mouth.     No current facility-administered medications for this visit.    HISTORY OF PRESENT ILLNESS:   Oncology History Overview Note  Cancer Staging Breast cancer of upper-outer quadrant of right female breast [facility]) Staging form: Breast, AJCC 8th Edition - Clinical stage from 09/22/2017: Stage IB (cT1b, cN0, cM0, G2, ER-, PR-, HER2-) - Signed by Lanny Callander, MD on 10/08/2017  Breast cancer, left breast [facility]) Staging form: Breast, AJCC 7th Edition - Clinical stage from 09/12/2012: Stage IA (T1b, N0, M0) - Unsigned      Breast cancer, left breast (HCC)  08/25/2012 Receptors her2   ER 100% positive, PR 88% positive, HER-2 negative, Ki-67 16%   08/30/2012 Initial Diagnosis   Breast cancer, left breast    09/12/2012 Pathology Results   T1bN0 Grade 1 invasive ductal carcinoma, and DCIS.   09/12/2012 Surgery   Left breast lumpectomy and sentinel lymph node biopsy, negative margins.   11/09/2012 - 12/13/2012 Radiation Therapy   Adjuvant breast radiation   12/2012 - 11/2017 Anti-estrogen oral therapy   Anastrozole  1 mg daily, switched to Aromasin  25 mg daily in Jan 2015 due to diarrhea. Her Exemestane  was switched to letrozole  in 08/2017 due to high copay. She completed her 5 years in 11/2017.    09/01/2016 Imaging   US  Outside films Breast form [facility] 09/01/2016 IMPRESSION: Probably Benign 1. No significant change in oval hypoechoic masses in the left breast at 11:00 2 cm from the nipple and 10:30 4 cm from the nipple. Both of these masses resemble the area of fat necrosis previously biopsies at 2:00. In addition, both masses have developing central benign or dystrophic appearing calcifications and are favored to be other areas of fat necrosis. A 6 month follow-up left breast mammogram and ultrasound is recommended.   09/01/2016 Mammogram   [facility] Mammogram from [facility] 09/01/16 IMPRESSION  Incomplete - additional imaging evaluation needed Ultrasound of the upper medial left  breast mass is recommended.    03/09/2017 Mammogram   Previous lumpectomy changes in the upper outer left breast anterior depth with stable associated dystrophic calcifications.  Biopsy clip at the 2:00 in the left breast near the lumpectomy site corresponding to previously biopsied benign fat necrosis.  Persistent round mass in the upper medial left breast with benign-appearing central round calcification.  No significant masses, calcifications, or other findings are seen in the breast  Impression: ultrasound is recommended for the left breast   03/09/2017 Breast US    Left breast ultrasound impression:  No significant change in oval hypoechoic masses in the left breast at 11:00 2 cm from the nipple and 10:30 4 cm from the nipple.  Both of  these masses resemble the area of fat necrosis previously biopsied at 2:00.  In addition, both masses have developing central benign or dystrophic appearing calcifications and are favored to be other areas of fat necrosis.  A 61-month follow-up bilateral mammogram and left breast ultrasound is recommended.   09/14/2017 Breast US    Breast US  Bilateral 09/14/17 at [facility]  IMPRESSION:  The 1 cm oval mass in the left breast at 9:30 posterior depth is highly suggestive of malignancy. An Ultrasound guided biopsy is recommended.  The 0.8 cm round mass in the left breast at 11-12 o'clock anterior depth is suspicious of malignancy. An ultrasound guided biopsy is recommended.    09/22/2017 Pathology Results   Diagnosis 1. Breast, right, needle core biopsy - INVASIVE DUCTAL CARCINOMA, MSBR GRADE II/III. - DUCTAL CARCINOMA IN SITU WITH NECROSIS. 2. Breast, left, needle core biopsy - FAT NECROSIS. - NO EVIDENCE OF MALIGNANCY.   10/17/2017 Genetic Testing   Negative genetic testing on the Multicancer panel.  The Multi-Gene Panel offered by [facility] includes sequencing and/or deletion duplication testing of the following 83 genes: ALK, APC, ATM, AXIN2,BAP1,  BARD1, BLM, BMPR1A, BRCA1, BRCA2, BRIP1, CASR, CDC73, CDH1, CDK4, CDKN1B, CDKN1C, CDKN2A (p14ARF), CDKN2A (p16INK4a), CEBPA, CHEK2, CTNNA1, DICER1, DIS3L2, EGFR (c.2369C>T, p.Thr790Met variant only), EPCAM (Deletion/duplication testing only), FH, FLCN, GATA2, GPC3, GREM1 (Promoter region deletion/duplication testing only), HOXB13 (c.251G>A, p.Gly84Glu), HRAS, KIT, MAX, MEN1, MET, MITF (c.952G>A, p.Glu318Lys variant only), MLH1, MSH2, MSH3, MSH6, MUTYH, NBN, NF1, NF2, NTHL1, PALB2, PDGFRA, PHOX2B, PMS2, POLD1, POLE, POT1, PRKAR1A, PTCH1, PTEN, RAD50, RAD51C, RAD51D, RB1, RECQL4, RET, RUNX1, SDHAF2, SDHA (sequence changes only), SDHB, SDHC, SDHD, SMAD4, SMARCA4, SMARCB1, SMARCE1, STK11, SUFU, TERT, TERT, TMEM127, TP53, TSC1, TSC2, VHL, WRN and WT1.  The report date  is October 17, 2017.   Breast cancer of upper-outer quadrant of right female breast (HCC) (Resolved)  09/14/2017 Mammogram   Diagnostic mammogram at [facility]  IMPRESSION:  The new 0.9 cm oval high density mass in the right breast posterior depth superior region seen on the mediolateral oblique view only is indeterminate. The 1.1 cm focal asymmetry in the left breast central to the nipple anterior depth is indeterminate.    09/14/2017 Imaging   US  Bilateral at [facility] IMPRESSION: The 1 cm oval mass in the right breast at 9:30 posterior depth is highly suggestive of malignancy. The 0.8 cm round mass in the left breast at 11-12 o'clock anterior depth is suspicious of malignancy.    09/22/2017 Cancer Staging   Staging form: Breast, AJCC 8th Edition - Clinical stage from 09/22/2017: Stage IB (cT1b, cN0, cM0, G2, ER-, PR-, HER2-) - Signed by Lanny Callander, MD on 10/08/2017   09/22/2017 Pathology Results   Bilateral needle core biopsy 1. Breast, right, needle core biopsy - INVASIVE  DUCTAL CARCINOMA, MSBR GRADE II/III. - DUCTAL CARCINOMA IN SITU WITH NECROSIS. 2. Breast, left, needle core biopsy - FAT NECROSIS. - NO EVIDENCE OF MALIGNANCY.   09/25/2017 Receptors her2   Estrogen Receptor: 0 Progesterone 0 HER2: Negative. Ki-67: 80%    10/08/2017 Initial Diagnosis   Breast cancer of upper-outer quadrant of right female breast (HCC)   10/26/2017 Surgery   BREAST LUMPECTOMY WITH RADIOACTIVE SEED AND SENTINEL LYMPH NODE BIOPSY by Dr. Mikell  10/26/17   10/26/2017 Pathology Results   Diagnosis 10/26/17 1. Breast, lumpectomy, Right - INVASIVE DUCTAL CARCINOMA, GRADE 2, SPANNING 1 CM. - HIGH GRADE DUCTAL CARCINOMA IN SITU WITH NECROSIS. - FINAL RESECTION MARGINS (PARTS #2, 3, 4) ARE NEGATIVE. - BIOPSY SITE. - SEE ONCOLOGY TABLE. 2. Breast, excision, additional anterior margin- 2 pieces right - BENIGN BREAST TISSUE. 3. Breast, excision, Right unoriented anterior margin - BENIGN BREAST TISSUE. 4. Breast,  excision, Right deep margin - BENIGN BREAST TISSUE. 5. Lymph node, sentinel, biopsy, Right Axillary - ONE OF ONE LYMPH NODES NEGATIVE FOR CARCINOMA (0/1).   10/26/2017 Cancer Staging   Staging form: Breast, AJCC 8th Edition - Pathologic stage from 10/26/2017: Stage IB (pT1b, pN0, cM0, G2, ER-, PR-, HER2-) - Signed by Lanny Callander, MD on 11/12/2017   12/03/2017 - 02/25/2018 Chemotherapy   Weekly Abraxane  for 12 weeks 12/03/17-02/25/18   03/2018 - 04/19/2018 Radiation Therapy   With Dr. Izell       REVIEW OF SYSTEMS:   Constitutional: Denies fevers, chills or abnormal weight loss Eyes: Denies blurriness of vision Ears, nose, mouth, throat, and face: Denies mucositis or sore throat Respiratory: Denies cough, dyspnea or wheezes Cardiovascular: Denies palpitation, chest discomfort or lower extremity swelling Gastrointestinal:  Denies nausea, heartburn or change in bowel habits Skin: Denies abnormal skin rashes Lymphatics: Denies new lymphadenopathy or easy bruising Neurological:Denies numbness, tingling or new weaknesses Behavioral/Psych: Mood is stable, no new changes  All other systems were reviewed with the patient and are negative.   VITALS:   Today's Vitals   11/17/23 0920 11/17/23 0936  BP: 138/88   Pulse: 94   Resp: 17   Temp: 97.8 F (36.6 C)   SpO2: 95%   Weight: 121 lb 14.4 oz (55.3 kg)   PainSc:  0-No pain   Body mass index is 23.81 kg/m.   Wt Readings from Last 3 Encounters:  11/17/23 121 lb 14.4 oz (55.3 kg)  11/16/23 123 lb (55.8 kg)  07/14/23 121 lb (54.9 kg)    Body mass index is 23.81 kg/m.  Performance status (ECOG): 0 - Asymptomatic  PHYSICAL EXAM:   GENERAL:alert, no distress and comfortable SKIN: skin color, texture, turgor are normal, no rashes or significant lesions EYES: normal, Conjunctiva are pink and non-injected, sclera clear OROPHARYNX:no exudate, no erythema and lips, buccal mucosa, and tongue normal  NECK: supple, thyroid  normal size,  non-tender, without nodularity LYMPH:  no palpable lymphadenopathy in the cervical, axillary or inguinal LUNGS: clear to auscultation and percussion with normal breathing effort HEART: regular rate & rhythm and no murmurs and no lower extremity edema ABDOMEN:abdomen soft, non-tender and normal bowel sounds Musculoskeletal:no cyanosis of digits and no clubbing  NEURO: alert & oriented x 3 with fluent speech, no focal motor/sensory deficits BREAST: Well-healed lumpectomy scars on the right breast with expected radiation changes to the skin.  There are no palpable lumps or masses.  There is no nipple inversion or nipple discharge.  There is well-healed surgical scar in the right axillary region.  There  is no palpable lymphadenopathy on the right side.  There is well-healed lumpectomy scar on the left.  There are no palpable masses or lumps today.  There is no nipple inversion or nipple discharge.  There is no axillary lymphadenopathy on the left side.  LABORATORY DATA:  I have reviewed the data as listed    Component Value Date/Time   NA 140 11/17/2023 0849   NA 143 06/24/2017 1002   NA 139 03/22/2017 0958   K 4.0 11/17/2023 0849   K 4.4 03/22/2017 0958   CL 105 11/17/2023 0849   CL 104 09/20/2012 1302   CO2 28 11/17/2023 0849   CO2 25 03/22/2017 0958   GLUCOSE 168 (H) 11/17/2023 0849   GLUCOSE 140 03/22/2017 0958   GLUCOSE 108 (H) 09/20/2012 1302   BUN 23 11/17/2023 0849   BUN 21 06/24/2017 1002   BUN 23.8 03/22/2017 0958   CREATININE 1.04 (H) 11/17/2023 0849   CREATININE 1.2 (H) 03/22/2017 0958   CALCIUM  9.6 11/17/2023 0849   CALCIUM  10.1 03/22/2017 0958   PROT 7.3 11/17/2023 0849   PROT 7.0 03/22/2017 0958   ALBUMIN 4.1 11/17/2023 0849   ALBUMIN 3.9 03/22/2017 0958   AST 22 11/17/2023 0849   AST 19 03/22/2017 0958   ALT 19 11/17/2023 0849   ALT 24 03/22/2017 0958   ALKPHOS 99 11/17/2023 0849   ALKPHOS 84 03/22/2017 0958   BILITOT 0.7 11/17/2023 0849   BILITOT 0.44  03/22/2017 0958   GFRNONAA 52 (L) 11/17/2023 0849   GFRAA 48 (L) 10/30/2019 0858    Lab Results  Component Value Date   WBC 8.2 11/17/2023   NEUTROABS 6.1 11/17/2023   HGB 14.2 11/17/2023   HCT 42.1 11/17/2023   MCV 94.2 11/17/2023   PLT 180 11/17/2023

## 2023-11-17 ENCOUNTER — Inpatient Hospital Stay: Payer: Medicare PPO | Attending: Nurse Practitioner

## 2023-11-17 ENCOUNTER — Inpatient Hospital Stay (HOSPITAL_BASED_OUTPATIENT_CLINIC_OR_DEPARTMENT_OTHER): Payer: Medicare PPO | Admitting: Nurse Practitioner

## 2023-11-17 VITALS — BP 138/88 | HR 94 | Temp 97.8°F | Resp 17 | Wt 121.9 lb

## 2023-11-17 DIAGNOSIS — Z171 Estrogen receptor negative status [ER-]: Secondary | ICD-10-CM | POA: Diagnosis not present

## 2023-11-17 DIAGNOSIS — C50411 Malignant neoplasm of upper-outer quadrant of right female breast: Secondary | ICD-10-CM | POA: Diagnosis not present

## 2023-11-17 DIAGNOSIS — Z170 Estrogen receptor positive status [ER+]: Secondary | ICD-10-CM | POA: Diagnosis not present

## 2023-11-17 DIAGNOSIS — C50412 Malignant neoplasm of upper-outer quadrant of left female breast: Secondary | ICD-10-CM

## 2023-11-17 DIAGNOSIS — Z9221 Personal history of antineoplastic chemotherapy: Secondary | ICD-10-CM | POA: Insufficient documentation

## 2023-11-17 DIAGNOSIS — M810 Age-related osteoporosis without current pathological fracture: Secondary | ICD-10-CM | POA: Diagnosis not present

## 2023-11-17 DIAGNOSIS — Z923 Personal history of irradiation: Secondary | ICD-10-CM | POA: Diagnosis not present

## 2023-11-17 DIAGNOSIS — Z853 Personal history of malignant neoplasm of breast: Secondary | ICD-10-CM | POA: Diagnosis not present

## 2023-11-17 DIAGNOSIS — Z87891 Personal history of nicotine dependence: Secondary | ICD-10-CM | POA: Diagnosis not present

## 2023-11-17 DIAGNOSIS — N182 Chronic kidney disease, stage 2 (mild): Secondary | ICD-10-CM | POA: Insufficient documentation

## 2023-11-17 LAB — CBC WITH DIFFERENTIAL (CANCER CENTER ONLY)
Abs Immature Granulocytes: 0.02 K/uL (ref 0.00–0.07)
Basophils Absolute: 0.1 K/uL (ref 0.0–0.1)
Basophils Relative: 1 %
Eosinophils Absolute: 0.1 K/uL (ref 0.0–0.5)
Eosinophils Relative: 1 %
HCT: 42.1 % (ref 36.0–46.0)
Hemoglobin: 14.2 g/dL (ref 12.0–15.0)
Immature Granulocytes: 0 %
Lymphocytes Relative: 17 %
Lymphs Abs: 1.4 K/uL (ref 0.7–4.0)
MCH: 31.8 pg (ref 26.0–34.0)
MCHC: 33.7 g/dL (ref 30.0–36.0)
MCV: 94.2 fL (ref 80.0–100.0)
Monocytes Absolute: 0.6 K/uL (ref 0.1–1.0)
Monocytes Relative: 7 %
Neutro Abs: 6.1 K/uL (ref 1.7–7.7)
Neutrophils Relative %: 74 %
Platelet Count: 180 K/uL (ref 150–400)
RBC: 4.47 MIL/uL (ref 3.87–5.11)
RDW: 13.2 % (ref 11.5–15.5)
WBC Count: 8.2 K/uL (ref 4.0–10.5)
nRBC: 0 % (ref 0.0–0.2)

## 2023-11-17 LAB — CMP (CANCER CENTER ONLY)
ALT: 19 U/L (ref 0–44)
AST: 22 U/L (ref 15–41)
Albumin: 4.1 g/dL (ref 3.5–5.0)
Alkaline Phosphatase: 99 U/L (ref 38–126)
Anion gap: 7 (ref 5–15)
BUN: 23 mg/dL (ref 8–23)
CO2: 28 mmol/L (ref 22–32)
Calcium: 9.6 mg/dL (ref 8.9–10.3)
Chloride: 105 mmol/L (ref 98–111)
Creatinine: 1.04 mg/dL — ABNORMAL HIGH (ref 0.44–1.00)
GFR, Estimated: 52 mL/min — ABNORMAL LOW (ref >60)
Glucose, Bld: 168 mg/dL — ABNORMAL HIGH (ref 70–99)
Potassium: 4 mmol/L (ref 3.5–5.1)
Sodium: 140 mmol/L (ref 135–145)
Total Bilirubin: 0.7 mg/dL (ref 0.0–1.2)
Total Protein: 7.3 g/dL (ref 6.5–8.1)

## 2023-11-21 ENCOUNTER — Other Ambulatory Visit: Payer: Self-pay | Admitting: Cardiology

## 2023-11-21 ENCOUNTER — Encounter: Payer: Self-pay | Admitting: Nurse Practitioner

## 2023-11-21 DIAGNOSIS — I4821 Permanent atrial fibrillation: Secondary | ICD-10-CM

## 2023-11-22 ENCOUNTER — Telehealth: Payer: Self-pay | Admitting: Adult Health

## 2023-11-22 ENCOUNTER — Telehealth: Payer: Self-pay | Admitting: Nurse Practitioner

## 2023-11-22 NOTE — Telephone Encounter (Signed)
 Copied from CRM [id number]. Topic: General - Other >> Nov 22, 2023  9:55 AM Emylou G wrote: Reason for CRM:  [employer] called.. said faxed over an order for PT - about 30 minutes ago.. Wants to make sure we got it.Adrienne [name] is the contact [phone]  Talked with [name] to let her know that Adrienne [name] is not in the office on Monday and will be returning tomorrow. Will hear back from them sometime this week.   Please advise.

## 2023-11-22 NOTE — Telephone Encounter (Signed)
 Scheduled appointments per 7/16 los. Talked with the patient and she is aware of the made appointments.

## 2023-11-22 NOTE — Telephone Encounter (Signed)
 Prescription refill request for Eliquis  received. Indication: AF Last office visit: 09/11/22  MARLA Satterfield NP Scr: 1.04 on 11/17/23  Epic Age: 89 Weight: 54.7kg  Based on above findings Eliquis  2.5mg  twice daily is the appropriate dose.  Pt is past due for appt with Dr Pietro.  Message sent to schedulers.  Refill approved.

## 2023-11-23 NOTE — Telephone Encounter (Signed)
Form faxed and confirmed

## 2023-11-23 NOTE — Telephone Encounter (Signed)
 Form given to [facility].

## 2023-11-24 DIAGNOSIS — R2689 Other abnormalities of gait and mobility: Secondary | ICD-10-CM | POA: Diagnosis not present

## 2023-11-24 DIAGNOSIS — R278 Other lack of coordination: Secondary | ICD-10-CM | POA: Diagnosis not present

## 2023-11-26 DIAGNOSIS — R2689 Other abnormalities of gait and mobility: Secondary | ICD-10-CM | POA: Diagnosis not present

## 2023-11-26 DIAGNOSIS — R278 Other lack of coordination: Secondary | ICD-10-CM | POA: Diagnosis not present

## 2023-11-29 DIAGNOSIS — R2689 Other abnormalities of gait and mobility: Secondary | ICD-10-CM | POA: Diagnosis not present

## 2023-11-29 DIAGNOSIS — R278 Other lack of coordination: Secondary | ICD-10-CM | POA: Diagnosis not present

## 2023-12-01 DIAGNOSIS — R2689 Other abnormalities of gait and mobility: Secondary | ICD-10-CM | POA: Diagnosis not present

## 2023-12-01 DIAGNOSIS — R278 Other lack of coordination: Secondary | ICD-10-CM | POA: Diagnosis not present

## 2023-12-03 DIAGNOSIS — R2689 Other abnormalities of gait and mobility: Secondary | ICD-10-CM | POA: Diagnosis not present

## 2023-12-03 DIAGNOSIS — R278 Other lack of coordination: Secondary | ICD-10-CM | POA: Diagnosis not present

## 2023-12-06 DIAGNOSIS — R278 Other lack of coordination: Secondary | ICD-10-CM | POA: Diagnosis not present

## 2023-12-06 DIAGNOSIS — R2689 Other abnormalities of gait and mobility: Secondary | ICD-10-CM | POA: Diagnosis not present

## 2023-12-08 DIAGNOSIS — R278 Other lack of coordination: Secondary | ICD-10-CM | POA: Diagnosis not present

## 2023-12-08 DIAGNOSIS — R2689 Other abnormalities of gait and mobility: Secondary | ICD-10-CM | POA: Diagnosis not present

## 2023-12-10 DIAGNOSIS — R278 Other lack of coordination: Secondary | ICD-10-CM | POA: Diagnosis not present

## 2023-12-10 DIAGNOSIS — R2689 Other abnormalities of gait and mobility: Secondary | ICD-10-CM | POA: Diagnosis not present

## 2023-12-15 DIAGNOSIS — R278 Other lack of coordination: Secondary | ICD-10-CM | POA: Diagnosis not present

## 2023-12-15 DIAGNOSIS — R2689 Other abnormalities of gait and mobility: Secondary | ICD-10-CM | POA: Diagnosis not present

## 2023-12-17 DIAGNOSIS — R2689 Other abnormalities of gait and mobility: Secondary | ICD-10-CM | POA: Diagnosis not present

## 2023-12-17 DIAGNOSIS — R278 Other lack of coordination: Secondary | ICD-10-CM | POA: Diagnosis not present

## 2023-12-20 DIAGNOSIS — R2689 Other abnormalities of gait and mobility: Secondary | ICD-10-CM | POA: Diagnosis not present

## 2023-12-20 DIAGNOSIS — R278 Other lack of coordination: Secondary | ICD-10-CM | POA: Diagnosis not present

## 2023-12-20 DIAGNOSIS — Z1231 Encounter for screening mammogram for malignant neoplasm of breast: Secondary | ICD-10-CM | POA: Diagnosis not present

## 2023-12-22 DIAGNOSIS — R2689 Other abnormalities of gait and mobility: Secondary | ICD-10-CM | POA: Diagnosis not present

## 2023-12-22 DIAGNOSIS — R278 Other lack of coordination: Secondary | ICD-10-CM | POA: Diagnosis not present

## 2023-12-24 DIAGNOSIS — R2689 Other abnormalities of gait and mobility: Secondary | ICD-10-CM | POA: Diagnosis not present

## 2023-12-24 DIAGNOSIS — R278 Other lack of coordination: Secondary | ICD-10-CM | POA: Diagnosis not present

## 2023-12-27 DIAGNOSIS — R278 Other lack of coordination: Secondary | ICD-10-CM | POA: Diagnosis not present

## 2023-12-27 DIAGNOSIS — R2689 Other abnormalities of gait and mobility: Secondary | ICD-10-CM | POA: Diagnosis not present

## 2023-12-29 DIAGNOSIS — R278 Other lack of coordination: Secondary | ICD-10-CM | POA: Diagnosis not present

## 2023-12-29 DIAGNOSIS — R2689 Other abnormalities of gait and mobility: Secondary | ICD-10-CM | POA: Diagnosis not present

## 2023-12-30 ENCOUNTER — Encounter: Payer: Self-pay | Admitting: Hematology

## 2023-12-31 DIAGNOSIS — N390 Urinary tract infection, site not specified: Secondary | ICD-10-CM | POA: Diagnosis not present

## 2023-12-31 DIAGNOSIS — R2689 Other abnormalities of gait and mobility: Secondary | ICD-10-CM | POA: Diagnosis not present

## 2023-12-31 DIAGNOSIS — R278 Other lack of coordination: Secondary | ICD-10-CM | POA: Diagnosis not present

## 2024-01-03 DIAGNOSIS — R2689 Other abnormalities of gait and mobility: Secondary | ICD-10-CM | POA: Diagnosis not present

## 2024-01-03 DIAGNOSIS — R278 Other lack of coordination: Secondary | ICD-10-CM | POA: Diagnosis not present

## 2024-01-05 DIAGNOSIS — R278 Other lack of coordination: Secondary | ICD-10-CM | POA: Diagnosis not present

## 2024-01-07 DIAGNOSIS — R278 Other lack of coordination: Secondary | ICD-10-CM | POA: Diagnosis not present

## 2024-01-10 DIAGNOSIS — R2689 Other abnormalities of gait and mobility: Secondary | ICD-10-CM | POA: Diagnosis not present

## 2024-01-10 DIAGNOSIS — R278 Other lack of coordination: Secondary | ICD-10-CM | POA: Diagnosis not present

## 2024-01-14 DIAGNOSIS — R278 Other lack of coordination: Secondary | ICD-10-CM | POA: Diagnosis not present

## 2024-01-17 DIAGNOSIS — R278 Other lack of coordination: Secondary | ICD-10-CM | POA: Diagnosis not present

## 2024-01-19 DIAGNOSIS — R278 Other lack of coordination: Secondary | ICD-10-CM | POA: Diagnosis not present

## 2024-01-21 DIAGNOSIS — R278 Other lack of coordination: Secondary | ICD-10-CM | POA: Diagnosis not present

## 2024-01-24 DIAGNOSIS — R278 Other lack of coordination: Secondary | ICD-10-CM | POA: Diagnosis not present

## 2024-01-26 DIAGNOSIS — R278 Other lack of coordination: Secondary | ICD-10-CM | POA: Diagnosis not present

## 2024-01-26 DIAGNOSIS — R2689 Other abnormalities of gait and mobility: Secondary | ICD-10-CM | POA: Diagnosis not present

## 2024-01-31 ENCOUNTER — Ambulatory Visit: Admitting: Family Medicine

## 2024-01-31 ENCOUNTER — Telehealth: Payer: Self-pay

## 2024-01-31 ENCOUNTER — Encounter: Payer: Self-pay | Admitting: Family Medicine

## 2024-01-31 VITALS — BP 144/70 | HR 66 | Temp 95.6°F | Resp 16 | Ht 60.0 in | Wt 124.0 lb

## 2024-01-31 DIAGNOSIS — R300 Dysuria: Secondary | ICD-10-CM

## 2024-01-31 DIAGNOSIS — N390 Urinary tract infection, site not specified: Secondary | ICD-10-CM

## 2024-01-31 DIAGNOSIS — I10 Essential (primary) hypertension: Secondary | ICD-10-CM

## 2024-01-31 LAB — POCT URINALYSIS DIPSTICK
Bilirubin, UA: NEGATIVE
Blood, UA: POSITIVE
Glucose, UA: NEGATIVE
Ketones, UA: NEGATIVE
Nitrite, UA: NEGATIVE
Protein, UA: POSITIVE — AB
Spec Grav, UA: 1.025 (ref 1.010–1.025)
Urobilinogen, UA: 0.2 U/dL
pH, UA: 6 (ref 5.0–8.0)

## 2024-01-31 MED ORDER — CIPROFLOXACIN HCL 250 MG PO TABS: 250.0000 mg | ORAL_TABLET | Freq: Two times a day (BID) | ORAL | 0 refills | Status: DC

## 2024-01-31 NOTE — Telephone Encounter (Signed)
 Attempted to reach pt regarding to Rx sent.   Left a voicemail to call us  back.

## 2024-01-31 NOTE — Patient Instructions (Addendum)
 A few things to remember from today's visit:  Dysuria  Essential hypertension Monitor for new symptoms. We need to collect urine to decide treatment.  If you need refills for medications you take chronically, please call your pharmacy. Do not use My Chart to request refills or for acute issues that need immediate attention. If you send a my chart message, it may take a few days to be addressed, specially if I am not in the office.  Please be sure medication list is accurate. If a new problem present, please set up appointment sooner than planned today.

## 2024-01-31 NOTE — Progress Notes (Signed)
 Chief Complaint  Patient presents with   Dysuria    Pt is here with friend, Adrienne [name]. Pt c/o burning when urinate. Just got over UTI not too long ago. Was given amoxicillin. Also c/o urinary urgency and frequency. Sx going on 2 days.    [name] is a 89 y.o. female with past medical history significant for atrial fibrillation on chronic anticoagulation, DM II, HLD,hypertension, and history of breast cancer s/p chemo and radiation therapy here today complaining of 2 of urinary symptoms.  Dysuria  This is a recurrent problem. The current episode started in the past 7 days. The problem occurs intermittently. The problem has been unchanged. The quality of the pain is described as burning. The pain is mild. There has been no fever. She is Not sexually active. There is No history of pyelonephritis. Associated symptoms include frequency, hesitancy and urgency. Pertinent negatives include no chills, discharge, flank pain, hematuria, nausea, sweats or vomiting. She has tried antibiotics for the symptoms. The treatment provided no relief. Her past medical history is significant for recurrent UTIs.  No associated abdominal pain, nausea, vomiting, changes in bowel habits, vaginal pruritus/discharge/bleeding.  No history of frequent falls. She is currently doing PT for fall prevention.  About 3 weeks ago she was treated for UTI with amoxicillin x 10 days, which she did not feel like it helped.  Urine culture in 07/2023: ISOLATE 2: Klebsiella pneumoniae Abnormal   Comment: Greater than 100,000 CFU/mL of Klebsiella pneumoniae        Susceptibility   Klebsiella pneumoniae    URINE CULTURE NEGATIVE 2    AMOX/CLAVULANIC <=2 Sensitive    AMPICILLIN >=32 Resistant    AMPICILLIN/SULBACTAM 4 Sensitive    CEFAZOLIN <=4 Not Reportable 1    CEFEPIME <=1 Sensitive    CEFTAZIDIME <=1 Sensitive    CEFTRIAXONE  <=1 Sensitive    CIPROFLOXACIN  <=0.25 Sensitive    GENTAMICIN <=1 Sensitive    IMIPENEM  0.5 Sensitive    LEVOFLOXACIN <=0.12 Sensitive    NITROFURANTOIN  64 Intermediate    PIP/TAZO <=4 Sensitive    TOBRAMYCIN <=1 Sensitive    TRIMETH/SULFA <=20 Sensitive 2             BP mildly elevated today. Hypertension and atrial fibrillation on diltiazem  360 mg daily and atenolol  25 mg daily. She does not check BP at home.  Lab Results  Component Value Date   NA 140 11/17/2023   CL 105 11/17/2023   K 4.0 11/17/2023   CO2 28 11/17/2023   BUN 23 11/17/2023   CREATININE 1.04 (H) 11/17/2023   GFRNONAA 52 (L) 11/17/2023   CALCIUM  9.6 11/17/2023   ALBUMIN 4.1 11/17/2023   GLUCOSE 168 (H) 11/17/2023   Review of Systems  Constitutional:  Negative for chills.  Respiratory:  Negative for cough and shortness of breath.   Cardiovascular:  Negative for chest pain, palpitations and leg swelling.  Gastrointestinal:  Negative for nausea and vomiting.  Genitourinary:  Positive for dysuria, frequency, hesitancy and urgency. Negative for flank pain and hematuria.  Skin:  Negative for rash.  Neurological:  Negative for syncope and weakness.  Psychiatric/Behavioral:  Negative for confusion and hallucinations.   See other pertinent positives and negatives in HPI.  Current Outpatient Medications on File Prior to Visit  Medication Sig Dispense Refill   Accu-Chek Softclix Lancets lancets Use to test up to 4 times daily. 100 each 12   allopurinol  (ZYLOPRIM ) 300 MG tablet TAKE 1 TABLET BY MOUTH EVERY DAY  90 tablet 3   apixaban  (ELIQUIS ) 2.5 MG TABS tablet TAKE 1 TABLET BY MOUTH TWICE A DAY 60 tablet 2   atenolol  (TENORMIN ) 25 MG tablet TAKE 1 TABLET BY MOUTH EVERY DAY 90 tablet 3   blood glucose meter kit and supplies KIT Dispense based on patient and insurance preference. Use up to four times daily as directed. 1 each 0   chlorpheniramine (CHLOR-TRIMETON) 4 MG tablet Take 4 mg 2 (two) times daily as needed by mouth for allergies.     diltiazem  (CARDIZEM  CD) 360 MG 24 hr capsule TAKE 1 CAPSULE BY  MOUTH EVERY DAY 90 capsule 3   fish oil-omega-3 fatty acids 1000 MG capsule Take 1 g by mouth daily.     glipiZIDE  (GLUCOTROL  XL) 5 MG 24 hr tablet Take 1 tablet (5 mg total) by mouth daily with breakfast. 90 tablet 1   glucose blood (ACCU-CHEK GUIDE) test strip TEST UP TO FOUR TIMES A DAY 100 strip 3   metFORMIN  (GLUCOPHAGE ) 500 MG tablet Take 1 tablet (500 mg total) by mouth 2 (two) times daily with a meal. 180 tablet 3   Multiple Vitamin (MULTIVITAMIN) tablet Take 1 tablet by mouth daily.     OVER THE COUNTER MEDICATION Take by mouth 2 (two) times daily. Presavision- for macular degeneration prevention     VITAMIN D , ERGOCALCIFEROL , PO Take by mouth.     rosuvastatin  (CRESTOR ) 5 MG tablet TAKE 1 TABLET (5 MG TOTAL) BY MOUTH DAILY. (Patient not taking: Reported on 01/31/2024) 90 tablet 3   No current facility-administered medications on file prior to visit.    Past Medical History:  Diagnosis Date   Allergy    Atrial fibrillation (HCC)    Breast cancer (HCC) 08/25/12   Invasive ductal ca,DCIS   Diabetes mellitus    type II   Family history of cancer    Gout    Hearing loss    History of breast cancer    History of frequent urinary tract infections    History of radiation therapy 03/21/18- 04/19/18   Right Breast 2.67 Gy X 15 fractions, right breast boost 2 Gy X 5 fractions.    Hx of radiation therapy 11/22/12- 12/19/12   breast 4250 cGy 17 sessions, left breast boost 750 cGy 3 sessions   Hyperlipidemia    Hypertension    Osteopenia    Allergies  Allergen Reactions   Penicillins Hives    Has patient had a PCN reaction causing immediate rash, facial/tongue/throat swelling, SOB or lightheadedness with hypotension: No Has patient had a PCN reaction causing severe rash involving mucus membranes or skin necrosis: No Has patient had a PCN reaction that required hospitalization: No Has patient had a PCN reaction occurring within the last 10 years: No If all of the above answers are  NO, then may proceed with Cephalosporin use.   Other Hives   Prednisone  Other (See Comments)    ELEVATED BLOOD SUGAR   Radiaplexrx [Wound Dressings] Hives   Sulfamethoxazole Nausea Only    Social History   Socioeconomic History   Marital status: Married    Spouse name: Not on file   Number of children: 3   Years of education: Not on file   Highest education level: Not on file  Occupational History   Not on file  Tobacco Use   Smoking status: Former    Current packs/day: 0.25    Average packs/day: 0.3 packs/day for 4.0 years (1.0 ttl pk-yrs)    Types: Cigarettes  Smokeless tobacco: Never   Tobacco comments:    Cigarette use was 50 years ago  Vaping Use   Vaping status: Never Used  Substance and Sexual Activity   Alcohol use: Not Currently   Drug use: No   Sexual activity: Not Currently    Comment: menarche age 37, 1st birth age 32, G68, HRT x 7 years?  Other Topics Concern   Not on file  Social History Narrative   Married - husband in memory care unit    Three children ( One lives at [place] and two in [place]      She likes to read and go to [employer]    Social Drivers of Health   Financial Resource Strain: Low Risk  (07/15/2022)   Overall Financial Resource Strain (CARDIA)    Difficulty of Paying Living Expenses: Not hard at all  Food Insecurity: No Food Insecurity (11/17/2023)   Hunger Vital Sign    Worried About Running Out of Food in the Last Year: Never true    Ran Out of Food in the Last Year: Never true  Transportation Needs: No Transportation Needs (11/17/2023)   PRAPARE - Administrator, Civil Service (Medical): No    Lack of Transportation (Non-Medical): No  Physical Activity: Sufficiently Active (03/31/2022)   Exercise Vital Sign    Days of Exercise per Week: 7 days    Minutes of Exercise per Session: 90 min  Stress: No Stress Concern Present (03/31/2022)   [employer] of Occupational Health - Occupational Stress Questionnaire     Feeling of Stress : Not at all  Social Connections: Moderately Integrated (03/31/2022)   Social Connection and Isolation Panel    Frequency of Communication with Friends and Family: More than three times a week    Frequency of Social Gatherings with Friends and Family: More than three times a week    Attends Religious Services: More than 4 times per year    Active Member of [employer] or Organizations: Yes    Attends Banker Meetings: More than 4 times per year    Marital Status: Widowed    Vitals:   01/31/24 1449 01/31/24 1450  BP: (!) 150/84 (!) 144/70  Pulse: 66   Resp: 16   Temp: (!) 95.6 F (35.3 C)   SpO2: 96%    Body mass index is 24.22 kg/m.  Physical Exam Vitals and nursing note reviewed.  Constitutional:      General: She is not in acute distress.    Appearance: She is well-developed.  HENT:     Head: Normocephalic and atraumatic.  Eyes:     Conjunctiva/sclera: Conjunctivae normal.  Cardiovascular:     Rate and Rhythm: Normal rate. Rhythm irregular.     Heart sounds: No murmur heard.    Comments: Trace pitting LE edema, bilateral Pulmonary:     Effort: Pulmonary effort is normal. No respiratory distress.     Breath sounds: Normal breath sounds.  Abdominal:     Palpations: Abdomen is soft. There is no mass.     Tenderness: There is no abdominal tenderness. There is no right CVA tenderness or left CVA tenderness.  Skin:    General: Skin is warm.     Findings: No erythema.  Neurological:     General: No focal deficit present.     Mental Status: She is alert and oriented to person, place, and time.     Comments: Unstable gait assisted with a cane.  Psychiatric:  Mood and Affect: Mood and affect normal.    ASSESSMENT AND PLAN:  Ms. Deionna Marcantonio was seen today for dysuria.  Diagnoses and all orders for this visit:  Dysuria We discussed differential Dx. Treated for UTI about 3 weeks ago. Urine dipstick today 3+ leuk, pos blood,and pos  protein. Ucx ordered.  -     POC Urinalysis Dipstick -     Urine Culture; Future -     Urine Culture  Urinary tract infection without hematuria, site unspecified Recently completed 10 days of Amoxicillin. States that Cipro  has helped in the past. We discussed some side effects, given her hx of atrial fib.  Treatment will be tailored according to Ucx results and susceptibility report.  Clearly instructed about warning signs. F/U in 2 weeks with PCP.  -     ciprofloxacin  (CIPRO ) 250 MG tablet; Take 1 tablet (250 mg total) by mouth 2 (two) times daily for 3 days.  Essential hypertension Mildly elevated, improved after a few minutes. Recommend monitoring BP regularly. Continue Diltiazem  360 mg daily and Atenolol  25 mg daily.  Return in about 2 weeks (around 02/14/2024) for Dysuria and frequency with PCP.  Fareeda Downard G. [place], MD  [facility]. [place] office.

## 2024-02-03 ENCOUNTER — Ambulatory Visit: Payer: Self-pay | Admitting: Family Medicine

## 2024-02-03 DIAGNOSIS — N390 Urinary tract infection, site not specified: Secondary | ICD-10-CM

## 2024-02-03 LAB — URINE CULTURE
MICRO NUMBER:: 17029712
SPECIMEN QUALITY:: ADEQUATE

## 2024-02-03 MED ORDER — CIPROFLOXACIN HCL 250 MG PO TABS: 250.0000 mg | ORAL_TABLET | Freq: Two times a day (BID) | ORAL | 0 refills | Status: AC

## 2024-02-17 ENCOUNTER — Other Ambulatory Visit: Payer: Self-pay | Admitting: Adult Health

## 2024-02-18 NOTE — Telephone Encounter (Signed)
  Will file in chart as: glipiZIDE  (GLUCOTROL  XL) 2.5 MG 24 hr tablet    The original prescription was discontinued on 07/01/2023 by Vicci Marjorie SAUNDERS, CMA for the following reason: Change in therapy. Renewing this prescription may not be appropriate.

## 2024-02-23 ENCOUNTER — Ambulatory Visit

## 2024-02-25 ENCOUNTER — Ambulatory Visit (INDEPENDENT_AMBULATORY_CARE_PROVIDER_SITE_OTHER)

## 2024-02-25 VITALS — Ht 60.0 in | Wt 124.0 lb

## 2024-02-25 DIAGNOSIS — Z0000 Encounter for general adult medical examination without abnormal findings: Secondary | ICD-10-CM | POA: Diagnosis not present

## 2024-02-25 NOTE — Patient Instructions (Addendum)
 [name],  Thank you for taking the time for your Medicare Wellness Visit. I appreciate your continued commitment to your health goals. Please review the care plan we discussed, and feel free to reach out if I can assist you further.  Medicare recommends these wellness visits once per year to help you and your care team stay ahead of potential health issues. These visits are designed to focus on prevention, allowing your provider to concentrate on managing your acute and chronic conditions during your regular appointments.  Please note that Annual Wellness Visits do not include a physical exam. Some assessments may be limited, especially if the visit was conducted virtually. If needed, we may recommend a separate in-person follow-up with your provider.  Ongoing Care Seeing your primary care provider every 3 to 6 months helps us  monitor your health and provide consistent, personalized care.   Referrals If a referral was made during today's visit and you haven't received any updates within two weeks, please contact the referred provider directly to check on the status.  Recommended Screenings:  Health Maintenance  Topic Date Due   Zoster (Shingles) Vaccine (1 of 2) 01/19/1954   Eye exam for diabetics  09/03/2022   Flu Shot  12/03/2023   Hemoglobin A1C  12/27/2023   COVID-19 Vaccine (9 - 2025-26 season) 01/03/2024   DTaP/Tdap/Td vaccine (3 - Tdap) 03/27/2024   Complete foot exam   06/28/2024   Breast Cancer Screening  12/19/2024   Medicare Annual Wellness Visit  02/24/2025   Pneumococcal Vaccine for age over 46  Completed   DEXA scan (bone density measurement)  Completed   Meningitis B Vaccine  Aged Out       02/25/2024   11:12 AM  Advanced Directives  Does Patient Have a Medical Advance Directive? Yes  Type of Estate agent of [place];Living will  Copy of Healthcare Power of Attorney in Chart? No - copy requested   Advance Care Planning is important  because it: Ensures you receive medical care that aligns with your values, goals, and preferences. Provides guidance to your family and loved ones, reducing the emotional burden of decision-making during critical moments.  Vision: Annual vision screenings are recommended for early detection of glaucoma, cataracts, and diabetic retinopathy. These exams can also reveal signs of chronic conditions such as diabetes and high blood pressure.  Dental: Annual dental screenings help detect early signs of oral cancer, gum disease, and other conditions linked to overall health, including heart disease and diabetes.  Please see the attached documents for additional preventive care recommendations.

## 2024-02-25 NOTE — Progress Notes (Signed)
 Subjective:   Adrienne Adrienne [name] is a 89 y.o. who presents for a Medicare Wellness preventive visit.  As a reminder, Annual Wellness Visits don't include a physical exam, and some assessments may be limited, especially if this visit is performed virtually. We may recommend an in-person follow-up visit with your provider if needed.  Visit Complete: Virtual I connected with  Adrienne Adrienne [name] on 02/25/24 by a audio enabled telemedicine application and verified that I am speaking with the correct person using two identifiers.  Patient Location: Home  Provider Location: Office/Clinic  I discussed the limitations of evaluation and management by telemedicine. The patient expressed understanding and agreed to proceed.  Vital Signs: Because this visit was a virtual/telehealth visit, some criteria may be missing or patient reported. Any vitals not documented were not able to be obtained and vitals that have been documented are patient reported.  VideoDeclined- This patient declined Librarian, academic. Therefore the visit was completed with audio only.  Persons Participating in Visit: Patient.  AWV Questionnaire: No: Patient Medicare AWV questionnaire was not completed prior to this visit.  Cardiac Risk Factors include: advanced age (>73men, >62 women);diabetes mellitus;dyslipidemia;hypertension     Objective:    Today's Vitals   02/25/24 1111  Weight: 124 lb (56.2 kg)  Height: 5' (1.524 m)   Body mass index is 24.22 kg/m.     02/25/2024   11:12 AM 11/17/2023    9:35 AM 06/21/2022   12:51 PM 03/31/2022    1:45 PM 03/25/2021    1:33 PM 02/21/2020    1:27 PM 04/11/2019   11:55 AM  Advanced Directives  Does Patient Have a Medical Advance Directive? Yes No No Yes Yes Yes Yes  Type of Estate agent of [place];Living will   Healthcare Power of [place];Living will Healthcare Power of [place];Living will Healthcare Power of [place];Living  will Living will  Does patient want to make changes to medical advance directive?       No - Patient declined  Copy of Healthcare Power of Attorney in Chart? No - copy requested   No - copy requested No - copy requested No - copy requested   Would patient like information on creating a medical advance directive?  No - Patient declined No - Patient declined    No - Patient declined    Current Medications (verified) Outpatient Encounter Medications as of 02/25/2024  Medication Sig   Accu-Chek Softclix Lancets lancets Use to test up to 4 times daily.   allopurinol  (ZYLOPRIM ) 300 MG tablet TAKE 1 TABLET BY MOUTH EVERY DAY   apixaban  (ELIQUIS ) 2.5 MG TABS tablet TAKE 1 TABLET BY MOUTH TWICE A DAY   atenolol  (TENORMIN ) 25 MG tablet TAKE 1 TABLET BY MOUTH EVERY DAY   blood glucose meter kit and supplies KIT Dispense based on patient and insurance preference. Use up to four times daily as directed.   chlorpheniramine (CHLOR-TRIMETON) 4 MG tablet Take 4 mg 2 (two) times daily as needed by mouth for allergies.   fish oil-omega-3 fatty acids 1000 MG capsule Take 1 g by mouth daily.   glipiZIDE  (GLUCOTROL  XL) 5 MG 24 hr tablet Take 1 tablet (5 mg total) by mouth daily with breakfast.   glucose blood (ACCU-CHEK GUIDE) test strip TEST UP TO FOUR TIMES A DAY   metFORMIN  (GLUCOPHAGE ) 500 MG tablet Take 1 tablet (500 mg total) by mouth 2 (two) times daily with a meal.   Multiple Vitamin (MULTIVITAMIN) tablet Take 1  tablet by mouth daily.   OVER THE COUNTER MEDICATION Take by mouth 2 (two) times daily. Presavision- for macular degeneration prevention   rosuvastatin  (CRESTOR ) 5 MG tablet TAKE 1 TABLET (5 MG TOTAL) BY MOUTH DAILY.   VITAMIN D , ERGOCALCIFEROL , PO Take by mouth.   diltiazem  (CARDIZEM  CD) 360 MG 24 hr capsule TAKE 1 CAPSULE BY MOUTH EVERY DAY   No facility-administered encounter medications on file as of 02/25/2024.    Allergies (verified) Penicillins, Other, Prednisone , Radiaplexrx [wound  dressings], and Sulfamethoxazole   History: Past Medical History:  Diagnosis Date   Allergy    Atrial fibrillation (HCC)    Breast cancer (HCC) 08/25/12   Invasive ductal ca,DCIS   Diabetes mellitus    type II   Family history of cancer    Gout    Hearing loss    History of breast cancer    History of frequent urinary tract infections    History of radiation therapy 03/21/18- 04/19/18   Right Breast 2.67 Gy X 15 fractions, right breast boost 2 Gy X 5 fractions.    Hx of radiation therapy 11/22/12- 12/19/12   breast 4250 cGy 17 sessions, left breast boost 750 cGy 3 sessions   Hyperlipidemia    Hypertension    Osteopenia    Past Surgical History:  Procedure Laterality Date   ABDOMINAL HYSTERECTOMY Bilateral 1999   w/b/l salpingo-oopherectomy   APPENDECTOMY     BREAST LUMPECTOMY WITH NEEDLE LOCALIZATION AND AXILLARY SENTINEL LYMPH NODE BX Left 09/12/2012   Procedure: LEFT NEEDLE LOCALIZATION BREAST LUMPECTOMY TION AND AXILLARY SENTINEL LYMPH NODE BX;  Surgeon: Adrienne ONEIDA Olives, MD;  Location: [facility];  Service: General;  Laterality: Left;   BREAST LUMPECTOMY WITH RADIOACTIVE SEED AND SENTINEL LYMPH NODE BIOPSY Right 10/26/2017   Procedure: BREAST LUMPECTOMY WITH RADIOACTIVE SEED AND SENTINEL LYMPH NODE BIOPSY;  Surgeon: [name] Morene, MD;  Location: [name];  Service: General;  Laterality: Right;   BREAST SURGERY  1990's   right- fibroid cyst   CHOLECYSTECTOMY     SEPTOPLASTY     TONSILLECTOMY     Family History  Problem Relation Age of Onset   Hypertension Mother    Heart disease Mother        Murmur and irregular heart disease   Dementia Mother        d. 6   Hypertension Father    Aneurysm Father 83   Breast cancer Cousin 35       mat first cousin   Social History   Socioeconomic History   Marital status: Widowed    Spouse name: Not on file   Number of children: 3   Years of education: Not on file   Highest education level: Not on file   Occupational History   Not on file  Tobacco Use   Smoking status: Former    Current packs/day: 0.25    Average packs/day: 0.3 packs/day for 4.0 years (1.0 ttl pk-yrs)    Types: Cigarettes   Smokeless tobacco: Never   Tobacco comments:    Cigarette use was 50 years ago  Vaping Use   Vaping status: Never Used  Substance and Sexual Activity   Alcohol use: Not Currently   Drug use: No   Sexual activity: Not Currently    Comment: menarche age 73, 1st birth age 9, G28, HRT x 7 years?  Other Topics Concern   Not on file  Social History Narrative   Widowed   Three children (  One lives at [place] and two in [place]      She likes to read and go to [employer]    Social Drivers of [employer]: Low Risk  (02/25/2024)   Overall Financial Resource Strain (CARDIA)    Difficulty of Paying Living Expenses: Not hard at all  Food Insecurity: No Food Insecurity (02/25/2024)   Hunger Vital Sign    Worried About Running Out of Food in the Last Year: Never true    Ran Out of Food in the Last Year: Never true  Transportation Needs: No Transportation Needs (02/25/2024)   PRAPARE - Administrator, Civil Service (Medical): No    Lack of Transportation (Non-Medical): No  Physical Activity: Sufficiently Active (02/25/2024)   Exercise Vital Sign    Days of Exercise per Week: 7 days    Minutes of Exercise per Session: 90 min  Stress: No Stress Concern Present (02/25/2024)   [employer] of Occupational Health - Occupational Stress Questionnaire    Feeling of Stress: Not at all  Social Connections: Moderately Integrated (02/25/2024)   Social Connection and Isolation Panel    Frequency of Communication with Friends and Family: More than three times a week    Frequency of Social Gatherings with Friends and Family: More than three times a week    Attends Religious Services: More than 4 times per year    Active Member of [employer] or Organizations: Yes    Attends  Banker Meetings: More than 4 times per year    Marital Status: Widowed    Tobacco Counseling Counseling given: Not Answered Tobacco comments: Cigarette use was 50 years ago    Clinical Intake:  Pre-visit preparation completed: Yes  Pain : No/denies pain     BMI - recorded: 24.22 Nutritional Status: BMI of 19-24  Normal Nutritional Risks: None Diabetes: Yes CBG done?: No Did pt. bring in CBG monitor from home?: No  Lab Results  Component Value Date   HGBA1C 8.2 (H) 06/29/2023   HGBA1C 6.8 (A) 01/07/2023   HGBA1C 6.3 (A) 10/06/2022     How often do you need to have someone help you when you read instructions, pamphlets, or other written materials from your doctor or pharmacy?: 1 - Never  Interpreter Needed?: No  Information entered by :: Verdie Saba, CMA   Activities of Daily Living     02/25/2024   11:15 AM  In your present state of health, do you have any difficulty performing the following activities:  Hearing? 0  Comment wears hearing aids  Vision? 0  Difficulty concentrating or making decisions? 0  Walking or climbing stairs? 0  Dressing or bathing? 0  Doing errands, shopping? 0  Preparing Food and eating ? N  Using the Toilet? N  In the past six months, have you accidently leaked urine? Y  Comment wears a pad/pantyliner  Do you have problems with loss of bowel control? N  Managing your Medications? N  Managing your Finances? N  Housekeeping or managing your Housekeeping? N    Patient Care Team: Merna Huxley, NP as PCP - General (Family Medicine) Pietro Redell RAMAN, MD as PCP - Cardiology (Cardiology) Burton, Lacie K, NP as Nurse Practitioner (Hematology and Oncology)  I have updated your Care Teams any recent Medical Services you may have received from other providers in the past year.     Assessment:   This is a routine wellness examination for Adrienne Adrienne [name].  Hearing/Vision screen Hearing  Screening - Comments:: Wears hearing  aids Vision Screening - Comments:: Denies vision concerns   Goals Addressed               This Visit's Progress     Patient Stated (pt-stated)        Patient stated she plans to continue exercising - just finished physical therapy for balance       Depression Screen     02/25/2024   11:17 AM 11/17/2023    9:36 AM 06/29/2023   10:16 AM 03/31/2022    1:42 PM 03/25/2021    1:34 PM 02/21/2020    1:29 PM 04/29/2018    1:12 PM  PHQ 2/9 Scores  PHQ - 2 Score 0 0 0 0 0 0 0  PHQ- 9 Score 3     0     Fall Risk     02/25/2024   11:16 AM 06/29/2023   10:16 AM 03/31/2022    1:44 PM 09/23/2021   10:27 AM 06/25/2021    2:34 PM  Fall Risk   Falls in the past year? 0 0 0 0 0  Number falls in past yr: 0 0 0 0 0  Injury with Fall? 0 0 0 0 0  Risk for fall due to : No Fall Risks;Impaired balance/gait  No Fall Risks History of fall(s);No Fall Risks   Follow up Falls evaluation completed;Falls prevention discussed Falls evaluation completed Falls prevention discussed  Falls evaluation completed       Data saved with a previous flowsheet row definition    MEDICARE RISK AT HOME:  Medicare Risk at Home Any stairs in or around the home?: No If so, are there any without handrails?: No Home free of loose throw rugs in walkways, pet beds, electrical cords, etc?: Yes Adequate lighting in your home to reduce risk of falls?: Yes Life alert?: Yes Use of a cane, walker or w/c?: Yes (cane/walker) Grab bars in the bathroom?: Yes Shower chair or bench in shower?: Yes Elevated toilet seat or a handicapped toilet?: Yes  TIMED UP AND GO:  Was the test performed?  No  Cognitive Function: 6CIT completed        02/25/2024   11:19 AM 03/31/2022    1:45 PM 03/25/2021    1:36 PM 02/21/2020    1:33 PM  6CIT Screen  What Year? 0 points 0 points 0 points 0 points  What month? 0 points 0 points 0 points 0 points  What time? 0 points 0 points 0 points 0 points  Count back from 20 0 points 0  points 0 points 0 points  Months in reverse 2 points 0 points 2 points 0 points  Repeat phrase 0 points 0 points 2 points 0 points  Total Score 2 points 0 points 4 points 0 points    Immunizations Immunization History  Administered Date(s) Administered    sv, Bivalent, Protein Subunit Rsvpref,pf (Abrysvo) 05/01/2022   Fluad Quad(high Dose 65+) 02/04/2019, 01/09/2020, 02/19/2022   INFLUENZA, HIGH DOSE SEASONAL PF 01/25/2015, 01/22/2016, 02/06/2017, 02/06/2017, 01/21/2018, 02/17/2021, 03/25/2023   Influenza Split 02/17/2012, 02/10/2013, 02/15/2014   Influenza Whole 05/04/2005, 01/26/2008, 02/16/2009, 02/01/2010   Moderna Covid-19 Vaccine Bivalent Booster 38yrs & up 01/24/2021, 09/19/2021   Moderna Sars-Covid-2 Vaccination 05/08/2019, 06/06/2019, 03/12/2020, 08/26/2020   Pfizer(Comirnaty)Fall Seasonal Vaccine 12 years and older 03/19/2023   Pneumococcal Conjugate-13 03/27/2014   Pneumococcal Polysaccharide-23 09/30/2001, 05/28/2010   Td 12/28/2003   Tetanus 03/27/2014   Unspecified SARS-COV-2 Vaccination 02/19/2022   Zoster,  Live 12/08/2007    Screening Tests Health Maintenance  Topic Date Due   Zoster Vaccines- Shingrix (1 of 2) 01/19/1954   OPHTHALMOLOGY EXAM  09/03/2022   Influenza Vaccine  12/03/2023   HEMOGLOBIN A1C  12/27/2023   COVID-19 Vaccine (9 - 2025-26 season) 01/03/2024   DTaP/Tdap/Td (3 - Tdap) 03/27/2024   FOOT EXAM  06/28/2024   Mammogram  12/19/2024   Medicare Annual Wellness (AWV)  02/24/2025   Pneumococcal Vaccine: 50+ Years  Completed   DEXA SCAN  Completed   Meningococcal B Vaccine  Aged Out    Health Maintenance Items Addressed:  02/25/2024  Additional Screening:  Vision Screening: Recommended annual ophthalmology exams for early detection of glaucoma and other disorders of the eye. Is the patient up to date with their annual eye exam?  Yes   Dental Screening: Recommended annual dental exams for proper oral hygiene  Community Resource Referral /  Chronic Care Management: CRR required this visit?  No   CCM required this visit?  No   Plan:    I have personally reviewed and noted the following in the patient's chart:   Medical and social history Use of alcohol, tobacco or illicit drugs  Current medications and supplements including opioid prescriptions. Patient is not currently taking opioid prescriptions. Functional ability and status Nutritional status Physical activity Advanced directives List of other physicians Hospitalizations, surgeries, and ER visits in previous 12 months Vitals Screenings to include cognitive, depression, and falls Referrals and appointments  In addition, I have reviewed and discussed with patient certain preventive protocols, quality metrics, and best practice recommendations. A written personalized care plan for preventive services as well as general preventive health recommendations were provided to patient.   Verdie CHRISTELLA Saba, CMA   02/25/2024   After Visit Summary: (Declined) Due to this being a telephonic visit, with patients personalized plan was offered to patient but patient Declined AVS at this time   Notes: Nothing significant to report at this time.

## 2024-02-27 ENCOUNTER — Other Ambulatory Visit: Payer: Self-pay | Admitting: Cardiology

## 2024-02-27 DIAGNOSIS — I4821 Permanent atrial fibrillation: Secondary | ICD-10-CM

## 2024-02-28 NOTE — Telephone Encounter (Signed)
 Eliquis  2.5mg  refill request received. Patient is 89 years old, weight-56.2kg, Crea-1.04 on 11/17/23, Diagnosis-Afib, and last seen by Kathyrn Lawrence on 09/11/22-needs an appointment. Dose is appropriate based on dosing criteria.   Pt needs an appointment, sending message to Schedulers.   02/29/24-left message for patient to call to schedule an appointment and sent a message to Schedulers to reach out. Limited supply being sent with a note to call to schedule an appointment.

## 2024-03-03 NOTE — Progress Notes (Signed)
 Cardiology Office Note:  .   Date:  03/07/2024  ID:  [name], DOB [birth year], MRN [id number] PCP: Merna Huxley, NP  [facility] Providers Cardiologist:  Redell Shallow, MD    History of Present Illness: .   AARTHI UYENO is a 89 y.o. female  with known history of permanent atrial fibrillation, CHA2DS2-VASc score 5 on apixaban .  Most recent echocardiogram 02/12/2020 revealed normal LV systolic function EF of 55 to 39%, mildly elevated pulmonary artery systolic pressure, no significant valvular abnormalities.    Other history includes type 2 diabetes, hypertension, chronic gout, vitamin D  deficiency, hypercholesterolemia, and history of breast cancer.  Patient comes in for regular f/u. She lives at [facility]. She still drives some and takes a 89 yr old to doctor's appt. Denies chest pain, palpitations, dyspnea, dizziness or presyncope. Now walking with a walker. Doing therapy to work on her balance. She gets 7000-8000 steps a day.  Goes to the gym 7 days a week doing stretching exercises and classes. She plays bridge once a week. She is bothered by freq UTI's.    ROS:    Studies Reviewed: SABRA    EKG Interpretation Date/Time:  Tuesday March 07 2024 09:22:41 EST Ventricular Rate:  78 PR Interval:    QRS Duration:  74 QT Interval:  386 QTC Calculation: 440 R Axis:   -17  Text Interpretation: Atrial fibrillation Minimal voltage criteria for LVH, may be normal variant ( R in aVL ) Septal infarct , age undetermined When compared with ECG of 21-Jun-2022 14:27, PREVIOUS ECG IS PRESENT Confirmed by Parthenia Klinefelter [phone]) on 03/07/2024 9:31:40 AM    Prior CV Studies:    Echocardiogram 02/12/2020 1. Left ventricular ejection fraction, by estimation, is 55 to 60%. The  left ventricle has normal function. The left ventricle has no regional  wall motion abnormalities. There is mild left ventricular hypertrophy.  Left ventricular diastolic parameters  are indeterminate.    2. Right ventricular systolic function is normal. The right ventricular  size is normal. There is mildly elevated pulmonary artery systolic  pressure. The estimated right ventricular systolic pressure is 42.4 mmHg.   3. The mitral valve is normal in structure. Trivial mitral valve  regurgitation.   4. The aortic valve is tricuspid. Aortic valve regurgitation is not  visualized. Mild aortic valve sclerosis is present, with no evidence of  aortic valve stenosis.   5. Evidence of atrial level shunting detected by color flow Doppler.  Likely PFO.   6. The inferior vena cava is normal in size with greater than 50%  respiratory variability, suggesting right atrial pressure of 3 mmHg.     Risk Assessment/Calculations:    CHA2DS2-VASc Score = 5   This indicates a 7.2% annual risk of stroke. The patient's score is based upon: CHF History: 0 HTN History: 1 Diabetes History: 1 Stroke History: 0 Vascular Disease History: 0 Age Score: 2 Gender Score: 1            Physical Exam:   VS:  BP 130/60 (BP Location: Left Arm, Patient Position: Sitting, Cuff Size: Normal)   Pulse 78   Ht 5' (1.524 m)   Wt 124 lb (56.2 kg)   BMI 24.22 kg/m    Orhtostatics: No data found. Wt Readings from Last 3 Encounters:  03/07/24 124 lb (56.2 kg)  02/25/24 124 lb (56.2 kg)  01/31/24 124 lb (56.2 kg)    GEN: Thin, young looking 89 yr old in no acute  distress NECK: No JVD; No carotid bruits CARDIAC:  irreg 2/6 systolic murmur LSB, no , rubs, gallops RESPIRATORY:  Clear to auscultation without rales, wheezing or rhonchi  ABDOMEN: Soft, non-tender, non-distended EXTREMITIES:  No edema; No deformity   ASSESSMENT AND PLAN: .     Atrial fibrillation: Heart rate wellcontrolled on atenolol  25 mg daily and diltiazem  360 mg daily.  She remains on apixaban  2.5 mg twice daily-appropriate dose for age/weight.   Labs reviewed on KPN and stable. Due for labs with PCP in Dec.   Hypertension: well controlled    Hypercholesterolemia:LDL 36 on crestor  5 mg daily 06/2023    Balance issues: She is using a walker and doing PT for balance issues which seem to help         Dispo: f/u Dr. Pietro 6 months  Signed, Olivia Pavy, PA-C

## 2024-03-07 ENCOUNTER — Ambulatory Visit: Attending: Physician Assistant | Admitting: Physician Assistant

## 2024-03-07 ENCOUNTER — Encounter: Payer: Self-pay | Admitting: Physician Assistant

## 2024-03-07 VITALS — BP 130/60 | HR 78 | Ht 60.0 in | Wt 124.0 lb

## 2024-03-07 DIAGNOSIS — I4821 Permanent atrial fibrillation: Secondary | ICD-10-CM

## 2024-03-07 DIAGNOSIS — R2689 Other abnormalities of gait and mobility: Secondary | ICD-10-CM | POA: Diagnosis not present

## 2024-03-07 DIAGNOSIS — I10 Essential (primary) hypertension: Secondary | ICD-10-CM

## 2024-03-07 DIAGNOSIS — E7800 Pure hypercholesterolemia, unspecified: Secondary | ICD-10-CM | POA: Diagnosis not present

## 2024-03-07 NOTE — Patient Instructions (Signed)
 Medication Instructions:  NO CHANGES.  Lab Work: NONE TO BE DONE TODAY.  Testing/Procedures: NONE  Follow-Up: At [facility], you and your health needs are our priority.  As part of our continuing mission to provide you with exceptional heart care, our providers are all part of one team.  This team includes your primary Cardiologist (physician) and Advanced Practice Providers or APPs (Physician Assistants and Nurse Practitioners) who all work together to provide you with the care you need, when you need it.  Your next appointment:   6 MONTHS  Provider:   Redell Shallow, MD    We recommend signing up for the patient portal called MyChart.  Sign up information is provided on this After Visit Summary.  MyChart is used to connect with patients for Virtual Visits (Telemedicine).  Patients are able to view lab/test results, encounter notes, upcoming appointments, etc.  Non-urgent messages can be sent to your provider as well.   To learn more about what you can do with MyChart, go to [email or web address].

## 2024-04-25 ENCOUNTER — Other Ambulatory Visit: Payer: Self-pay | Admitting: Cardiology

## 2024-04-25 DIAGNOSIS — I4821 Permanent atrial fibrillation: Secondary | ICD-10-CM

## 2024-04-25 NOTE — Telephone Encounter (Signed)
 Pt last saw Rosaline Pavy, PA on 03/07/24, last labs 11/17/23 Creat 1.04, age 89, weight 56.2kg, based on specified criteria pt is on appropriate dosage of Eliquis  2.5mg  BID for afib.  Will refill rx.

## 2024-05-02 ENCOUNTER — Ambulatory Visit: Admitting: Adult Health

## 2024-05-02 ENCOUNTER — Encounter: Payer: Self-pay | Admitting: Adult Health

## 2024-05-02 VITALS — BP 120/80 | HR 87 | Temp 98.1°F | Ht 60.0 in | Wt 125.0 lb

## 2024-05-02 DIAGNOSIS — Z7984 Long term (current) use of oral hypoglycemic drugs: Secondary | ICD-10-CM

## 2024-05-02 DIAGNOSIS — I10 Essential (primary) hypertension: Secondary | ICD-10-CM | POA: Diagnosis not present

## 2024-05-02 DIAGNOSIS — E119 Type 2 diabetes mellitus without complications: Secondary | ICD-10-CM | POA: Diagnosis not present

## 2024-05-02 LAB — POCT GLYCOSYLATED HEMOGLOBIN (HGB A1C): Hemoglobin A1C: 8.4 % — AB (ref 4.0–5.6)

## 2024-05-02 MED ORDER — METFORMIN HCL ER 500 MG PO TB24: 500.0000 mg | ORAL_TABLET | Freq: Every day | ORAL | 1 refills | Status: AC

## 2024-05-02 MED ORDER — GLIPIZIDE ER 5 MG PO TB24: 5.0000 mg | ORAL_TABLET | Freq: Every day | ORAL | 1 refills | Status: AC

## 2024-05-02 NOTE — Progress Notes (Signed)
 "  Subjective:    Patient ID: Adrienne [name], female    DOB: 1934/05/17, 89 y.o.   MRN: [id number]  HPI 89 year old female who  has a past medical history of Allergy, Atrial fibrillation (HCC), Breast cancer (HCC) (08/25/12), Diabetes mellitus, Family history of cancer, Gout, Hearing loss, History of breast cancer, History of frequent urinary tract infections, History of radiation therapy (03/21/18- 04/19/18), radiation therapy (11/22/12- 12/19/12), Hyperlipidemia, Hypertension, and Osteopenia.  Diabetes mellitus type II-managed with  glipizide  5 mg XR daily.   She denies episodes of hypoglycemia.  She does try and stay active with yoga and cardio classes multiple times a week and then does weight training 7 days a week.  During her last visit Glipizide  was increased from 2.5 mg daily to 5 mg daily.  Lab Results  Component Value Date   HGBA1C 8.4 (A) 05/02/2024   HGBA1C 8.2 (H) 06/29/2023   HGBA1C 6.8 (A) 01/07/2023   HTN -managed with Cardizem  360 mg daily and atenolol  5 mg daily.  She denies dizziness, lightheadedness, chest pain, or shortness of breath. She has not taken her blood pressure medication this morning.  BP Readings from Last 3 Encounters:  05/02/24 120/80  03/07/24 130/60  01/31/24 (!) 144/70    Review of Systems See HPI   Past Medical History:  Diagnosis Date   Allergy    Atrial fibrillation (HCC)    Breast cancer (HCC) 08/25/12   Invasive ductal ca,DCIS   Diabetes mellitus    type II   Family history of cancer    Gout    Hearing loss    History of breast cancer    History of frequent urinary tract infections    History of radiation therapy 03/21/18- 04/19/18   Right Breast 2.67 Gy X 15 fractions, right breast boost 2 Gy X 5 fractions.    Hx of radiation therapy 11/22/12- 12/19/12   breast 4250 cGy 17 sessions, left breast boost 750 cGy 3 sessions   Hyperlipidemia    Hypertension    Osteopenia     Social History   Socioeconomic History   Marital status:  Widowed    Spouse name: Not on file   Number of children: 3   Years of education: Not on file   Highest education level: Not on file  Occupational History   Not on file  Tobacco Use   Smoking status: Former    Current packs/day: 0.25    Average packs/day: 0.3 packs/day for 4.0 years (1.0 ttl pk-yrs)    Types: Cigarettes   Smokeless tobacco: Never   Tobacco comments:    Cigarette use was 50 years ago  Vaping Use   Vaping status: Never Used  Substance and Sexual Activity   Alcohol use: Not Currently   Drug use: No   Sexual activity: Not Currently    Comment: menarche age 57, 1st birth age 21, G23, HRT x 7 years?  Other Topics Concern   Not on file  Social History Narrative   Widowed   Three children ( One lives at [place] and two in [place]      She likes to read and go to [employer]    Social Drivers of Health   Tobacco Use: Medium Risk (05/02/2024)   Patient History    Smoking Tobacco Use: Former    Smokeless Tobacco Use: Never    Passive Exposure: Not on Actuary Strain: Low Risk (02/25/2024)   Overall Financial Resource Strain (CARDIA)  Difficulty of Paying Living Expenses: Not hard at all  Food Insecurity: No Food Insecurity (02/25/2024)   Epic    Worried About Programme Researcher, Broadcasting/film/video in the Last Year: Never true    Ran Out of Food in the Last Year: Never true  Transportation Needs: No Transportation Needs (02/25/2024)   Epic    Lack of Transportation (Medical): No    Lack of Transportation (Non-Medical): No  Physical Activity: Sufficiently Active (02/25/2024)   Exercise Vital Sign    Days of Exercise per Week: 7 days    Minutes of Exercise per Session: 90 min  Stress: No Stress Concern Present (02/25/2024)   [employer] of Occupational Health - Occupational Stress Questionnaire    Feeling of Stress: Not at all  Social Connections: Moderately Integrated (02/25/2024)   Social Connection and Isolation Panel    Frequency of Communication  with Friends and Family: More than three times a week    Frequency of Social Gatherings with Friends and Family: More than three times a week    Attends Religious Services: More than 4 times per year    Active Member of [employer] or Organizations: Yes    Attends Banker Meetings: More than 4 times per year    Marital Status: Widowed  Intimate Partner Violence: Not At Risk (02/25/2024)   Epic    Fear of Current or Ex-Partner: No    Emotionally Abused: No    Physically Abused: No    Sexually Abused: No  Depression (PHQ2-9): Low Risk (02/25/2024)   Depression (PHQ2-9)    PHQ-2 Score: 3  Alcohol Screen: Low Risk (02/25/2024)   Alcohol Screen    Last Alcohol Screening Score (AUDIT): 0  Housing: Unknown (02/25/2024)   Epic    Unable to Pay for Housing in the Last Year: No    Number of Times Moved in the Last Year: Not on file    Homeless in the Last Year: No  Utilities: Not At Risk (02/25/2024)   Epic    Threatened with loss of utilities: No  Health Literacy: Adequate Health Literacy (02/25/2024)   B1300 Health Literacy    Frequency of need for help with medical instructions: Never    Past Surgical History:  Procedure Laterality Date   ABDOMINAL HYSTERECTOMY Bilateral 1999   w/b/l salpingo-oopherectomy   APPENDECTOMY     BREAST LUMPECTOMY WITH NEEDLE LOCALIZATION AND AXILLARY SENTINEL LYMPH NODE BX Left 09/12/2012   Procedure: LEFT NEEDLE LOCALIZATION BREAST LUMPECTOMY TION AND AXILLARY SENTINEL LYMPH NODE BX;  Surgeon: Morene ONEIDA Olives, MD;  Location: [facility] OR;  Service: General;  Laterality: Left;   BREAST LUMPECTOMY WITH RADIOACTIVE SEED AND SENTINEL LYMPH NODE BIOPSY Right 10/26/2017   Procedure: BREAST LUMPECTOMY WITH RADIOACTIVE SEED AND SENTINEL LYMPH NODE BIOPSY;  Surgeon: Olives Morene, MD;  Location: [facility];  Service: General;  Laterality: Right;   BREAST SURGERY  1990's   right- fibroid cyst   CHOLECYSTECTOMY     SEPTOPLASTY      TONSILLECTOMY      Family History  Problem Relation Age of Onset   Hypertension Mother    Heart disease Mother        Murmur and irregular heart disease   Dementia Mother        d. 97   Hypertension Father    Aneurysm Father 70   Breast cancer Cousin 72       mat first cousin    Allergies[1]  Medications Ordered Prior to  Encounter[2]  BP 120/80   Pulse 87   Temp 98.1 F (36.7 C) (Oral)   Ht 5' (1.524 m)   Wt 125 lb (56.7 kg)   SpO2 97%   BMI 24.41 kg/m       Objective:   Physical Exam Vitals and nursing note reviewed.  Constitutional:      Appearance: Normal appearance.  Cardiovascular:     Rate and Rhythm: Normal rate and regular rhythm.     Pulses: Normal pulses.     Heart sounds: Normal heart sounds.  Pulmonary:     Effort: Pulmonary effort is normal.     Breath sounds: Normal breath sounds.  Skin:    General: Skin is warm and dry.  Neurological:     General: No focal deficit present.     Mental Status: She is alert and oriented to person, place, and time.  Psychiatric:        Mood and Affect: Mood normal.        Behavior: Behavior normal.        Thought Content: Thought content normal.        Judgment: Judgment normal.        Assessment & Plan:  1. Diabetes mellitus treated with oral medication (HCC) (Primary)  - POC HgB A1c- 8.4  - Will add Metformin  500 mg ER daily  - Follow up in 3 months for CPE  - metFORMIN  (GLUCOPHAGE -XR) 500 MG 24 hr tablet; Take 1 tablet (500 mg total) by mouth daily with breakfast.  Dispense: 90 tablet; Refill: 1 - glipiZIDE  (GLUCOTROL  XL) 5 MG 24 hr tablet; Take 1 tablet (5 mg total) by mouth daily with breakfast.  Dispense: 90 tablet; Refill: 1  2. Essential hypertension - Well controlled. No change in medication therapy   Celina Shiley, NP       [1]  Allergies Allergen Reactions   Penicillins Hives    Has patient had a PCN reaction causing immediate rash, facial/tongue/throat swelling, SOB or  lightheadedness with hypotension: No Has patient had a PCN reaction causing severe rash involving mucus membranes or skin necrosis: No Has patient had a PCN reaction that required hospitalization: No Has patient had a PCN reaction occurring within the last 10 years: No If all of the above answers are NO, then may proceed with Cephalosporin use.   Other Hives   Prednisone  Other (See Comments)    ELEVATED BLOOD SUGAR   Radiaplexrx [Wound Dressings] Hives   Sulfamethoxazole Nausea Only  [2]  Current Outpatient Medications on File Prior to Visit  Medication Sig Dispense Refill   Accu-Chek Softclix Lancets lancets Use to test up to 4 times daily. 100 each 12   allopurinol  (ZYLOPRIM ) 300 MG tablet TAKE 1 TABLET BY MOUTH EVERY DAY 90 tablet 3   apixaban  (ELIQUIS ) 2.5 MG TABS tablet Take 1 tablet (2.5 mg total) by mouth 2 (two) times daily. 180 tablet 1   atenolol  (TENORMIN ) 25 MG tablet TAKE 1 TABLET BY MOUTH EVERY DAY 90 tablet 3   blood glucose meter kit and supplies KIT Dispense based on patient and insurance preference. Use up to four times daily as directed. 1 each 0   chlorpheniramine (CHLOR-TRIMETON) 4 MG tablet Take 4 mg 2 (two) times daily as needed by mouth for allergies.     diltiazem  (CARDIZEM  CD) 360 MG 24 hr capsule TAKE 1 CAPSULE BY MOUTH EVERY DAY 90 capsule 3   fish oil-omega-3 fatty acids 1000 MG capsule Take 1 g by mouth  daily.     glipiZIDE  (GLUCOTROL  XL) 5 MG 24 hr tablet Take 1 tablet (5 mg total) by mouth daily with breakfast. 90 tablet 1   glucose blood (ACCU-CHEK GUIDE) test strip TEST UP TO FOUR TIMES A DAY 100 strip 3   Multiple Vitamin (MULTIVITAMIN) tablet Take 1 tablet by mouth daily.     OVER THE COUNTER MEDICATION Take by mouth 2 (two) times daily. Presavision- for macular degeneration prevention     rosuvastatin  (CRESTOR ) 5 MG tablet TAKE 1 TABLET (5 MG TOTAL) BY MOUTH DAILY. 90 tablet 3   VITAMIN D , ERGOCALCIFEROL , PO Take by mouth.     No current  facility-administered medications on file prior to visit.   "

## 2024-05-02 NOTE — Patient Instructions (Signed)
 It was great seeing you today   Your A1c was 8.4 - I am going to keep you on Glipizide  and add back Metformin  500 mg daily.   Lets follow up in 3 months for your annual exam

## 2024-05-03 ENCOUNTER — Encounter: Admitting: Adult Health

## 2024-05-14 ENCOUNTER — Other Ambulatory Visit: Payer: Self-pay | Admitting: Cardiology

## 2024-05-16 ENCOUNTER — Other Ambulatory Visit: Payer: Self-pay | Admitting: Cardiology

## 2024-08-08 ENCOUNTER — Ambulatory Visit: Admitting: Adult Health

## 2024-11-16 ENCOUNTER — Ambulatory Visit: Admitting: Nurse Practitioner

## 2024-11-16 ENCOUNTER — Other Ambulatory Visit

## 2025-02-26 ENCOUNTER — Ambulatory Visit
